# Patient Record
Sex: Female | Born: 1945 | Race: Black or African American | Hispanic: No | Smoking: Former smoker
Health system: Southern US, Community
[De-identification: ages and names within clinical notes are randomized; demographics above are authoritative.]

## PROBLEM LIST (undated history)

## (undated) DIAGNOSIS — G8929 Other chronic pain: Secondary | ICD-10-CM

## (undated) DIAGNOSIS — I10 Essential (primary) hypertension: Secondary | ICD-10-CM

## (undated) DIAGNOSIS — E079 Disorder of thyroid, unspecified: Secondary | ICD-10-CM

## (undated) DIAGNOSIS — Z973 Presence of spectacles and contact lenses: Secondary | ICD-10-CM

## (undated) DIAGNOSIS — I447 Left bundle-branch block, unspecified: Secondary | ICD-10-CM

## (undated) DIAGNOSIS — I2510 Atherosclerotic heart disease of native coronary artery without angina pectoris: Secondary | ICD-10-CM

## (undated) DIAGNOSIS — R51 Headache: Secondary | ICD-10-CM

## (undated) DIAGNOSIS — E785 Hyperlipidemia, unspecified: Secondary | ICD-10-CM

## (undated) DIAGNOSIS — I509 Heart failure, unspecified: Secondary | ICD-10-CM

## (undated) DIAGNOSIS — R519 Headache, unspecified: Secondary | ICD-10-CM

## (undated) DIAGNOSIS — R809 Proteinuria, unspecified: Secondary | ICD-10-CM

## (undated) DIAGNOSIS — N189 Chronic kidney disease, unspecified: Secondary | ICD-10-CM

## (undated) DIAGNOSIS — C801 Malignant (primary) neoplasm, unspecified: Secondary | ICD-10-CM

## (undated) DIAGNOSIS — M549 Dorsalgia, unspecified: Secondary | ICD-10-CM

## (undated) DIAGNOSIS — E1121 Type 2 diabetes mellitus with diabetic nephropathy: Secondary | ICD-10-CM

## (undated) DIAGNOSIS — M1990 Unspecified osteoarthritis, unspecified site: Secondary | ICD-10-CM

## (undated) DIAGNOSIS — Z992 Dependence on renal dialysis: Secondary | ICD-10-CM

## (undated) DIAGNOSIS — N186 End stage renal disease: Secondary | ICD-10-CM

## (undated) DIAGNOSIS — I272 Pulmonary hypertension, unspecified: Secondary | ICD-10-CM

## (undated) HISTORY — DX: Hyperlipidemia, unspecified: E78.5

## (undated) HISTORY — PX: MASTECTOMY: SHX3

## (undated) HISTORY — PX: BACK SURGERY: SHX140

## (undated) HISTORY — DX: Type 2 diabetes mellitus with diabetic nephropathy: E11.21

## (undated) HISTORY — DX: Disorder of thyroid, unspecified: E07.9

## (undated) HISTORY — PX: ABDOMINAL HYSTERECTOMY: SHX81

## (undated) HISTORY — PX: SPINE SURGERY: SHX786

## (undated) HISTORY — DX: Atherosclerotic heart disease of native coronary artery without angina pectoris: I25.10

## (undated) HISTORY — DX: Heart failure, unspecified: I50.9

## (undated) HISTORY — DX: Essential (primary) hypertension: I10

## (undated) HISTORY — PX: OTHER SURGICAL HISTORY: SHX169

## (undated) HISTORY — PX: TONSILLECTOMY: SUR1361

## (undated) HISTORY — PX: TUBAL LIGATION: SHX77

## (undated) HISTORY — DX: Chronic kidney disease, unspecified: N18.9

## (undated) HISTORY — DX: Proteinuria, unspecified: R80.9

## (undated) HISTORY — PX: APPENDECTOMY: SHX54

---

## 1997-10-22 ENCOUNTER — Encounter: Admission: RE | Admit: 1997-10-22 | Discharge: 1998-01-20 | Payer: Self-pay | Admitting: Anesthesiology

## 1998-02-16 ENCOUNTER — Encounter: Admission: RE | Admit: 1998-02-16 | Discharge: 1998-05-13 | Payer: Self-pay | Admitting: Anesthesiology

## 1998-05-13 ENCOUNTER — Encounter: Admission: RE | Admit: 1998-05-13 | Discharge: 1998-07-30 | Payer: Self-pay | Admitting: Anesthesiology

## 1998-07-30 ENCOUNTER — Encounter: Admission: RE | Admit: 1998-07-30 | Discharge: 1998-10-28 | Payer: Self-pay | Admitting: Anesthesiology

## 1998-11-23 ENCOUNTER — Encounter: Admission: RE | Admit: 1998-11-23 | Discharge: 1999-02-21 | Payer: Self-pay | Admitting: Anesthesiology

## 1999-03-24 ENCOUNTER — Encounter: Admission: RE | Admit: 1999-03-24 | Discharge: 1999-06-14 | Payer: Self-pay | Admitting: Anesthesiology

## 1999-06-14 ENCOUNTER — Encounter: Admission: RE | Admit: 1999-06-14 | Discharge: 1999-08-09 | Payer: Self-pay | Admitting: Anesthesiology

## 1999-08-09 ENCOUNTER — Encounter: Admission: RE | Admit: 1999-08-09 | Discharge: 1999-10-01 | Payer: Self-pay | Admitting: Anesthesiology

## 1999-10-01 ENCOUNTER — Encounter: Admission: RE | Admit: 1999-10-01 | Discharge: 1999-12-24 | Payer: Self-pay | Admitting: Anesthesiology

## 1999-12-24 ENCOUNTER — Encounter: Admission: RE | Admit: 1999-12-24 | Discharge: 2000-03-23 | Payer: Self-pay | Admitting: Anesthesiology

## 2000-03-29 ENCOUNTER — Encounter: Admission: RE | Admit: 2000-03-29 | Discharge: 2000-06-27 | Payer: Self-pay | Admitting: Anesthesiology

## 2000-08-14 ENCOUNTER — Encounter: Admission: RE | Admit: 2000-08-14 | Discharge: 2000-11-12 | Payer: Self-pay | Admitting: Anesthesiology

## 2000-12-29 ENCOUNTER — Encounter: Admission: RE | Admit: 2000-12-29 | Discharge: 2001-01-13 | Payer: Self-pay | Admitting: Anesthesiology

## 2001-05-25 ENCOUNTER — Emergency Department (HOSPITAL_COMMUNITY): Admission: EM | Admit: 2001-05-25 | Discharge: 2001-05-25 | Payer: Self-pay | Admitting: Emergency Medicine

## 2001-06-12 ENCOUNTER — Encounter (INDEPENDENT_AMBULATORY_CARE_PROVIDER_SITE_OTHER): Payer: Self-pay | Admitting: *Deleted

## 2001-06-12 ENCOUNTER — Ambulatory Visit (HOSPITAL_BASED_OUTPATIENT_CLINIC_OR_DEPARTMENT_OTHER): Admission: RE | Admit: 2001-06-12 | Discharge: 2001-06-12 | Payer: Self-pay | Admitting: Otolaryngology

## 2003-03-17 ENCOUNTER — Emergency Department (HOSPITAL_COMMUNITY): Admission: EM | Admit: 2003-03-17 | Discharge: 2003-03-17 | Payer: Self-pay | Admitting: Emergency Medicine

## 2003-03-24 ENCOUNTER — Emergency Department (HOSPITAL_COMMUNITY): Admission: EM | Admit: 2003-03-24 | Discharge: 2003-03-24 | Payer: Self-pay | Admitting: *Deleted

## 2003-04-01 ENCOUNTER — Emergency Department (HOSPITAL_COMMUNITY): Admission: EM | Admit: 2003-04-01 | Discharge: 2003-04-01 | Payer: Self-pay | Admitting: Emergency Medicine

## 2003-05-10 ENCOUNTER — Emergency Department (HOSPITAL_COMMUNITY): Admission: EM | Admit: 2003-05-10 | Discharge: 2003-05-10 | Payer: Self-pay | Admitting: Emergency Medicine

## 2003-05-17 DIAGNOSIS — C801 Malignant (primary) neoplasm, unspecified: Secondary | ICD-10-CM

## 2003-05-17 HISTORY — DX: Malignant (primary) neoplasm, unspecified: C80.1

## 2003-06-04 ENCOUNTER — Ambulatory Visit (HOSPITAL_COMMUNITY): Admission: RE | Admit: 2003-06-04 | Discharge: 2003-06-04 | Payer: Self-pay | Admitting: Sports Medicine

## 2003-06-05 ENCOUNTER — Emergency Department (HOSPITAL_COMMUNITY): Admission: EM | Admit: 2003-06-05 | Discharge: 2003-06-05 | Payer: Self-pay | Admitting: Emergency Medicine

## 2003-07-05 ENCOUNTER — Emergency Department (HOSPITAL_COMMUNITY): Admission: EM | Admit: 2003-07-05 | Discharge: 2003-07-05 | Payer: Self-pay | Admitting: Emergency Medicine

## 2003-12-05 ENCOUNTER — Emergency Department (HOSPITAL_COMMUNITY): Admission: EM | Admit: 2003-12-05 | Discharge: 2003-12-05 | Payer: Self-pay

## 2003-12-18 ENCOUNTER — Ambulatory Visit (HOSPITAL_COMMUNITY): Admission: RE | Admit: 2003-12-18 | Discharge: 2003-12-18 | Payer: Self-pay | Admitting: Orthopedic Surgery

## 2003-12-19 ENCOUNTER — Emergency Department (HOSPITAL_COMMUNITY): Admission: EM | Admit: 2003-12-19 | Discharge: 2003-12-19 | Payer: Self-pay | Admitting: Emergency Medicine

## 2003-12-19 ENCOUNTER — Ambulatory Visit (HOSPITAL_COMMUNITY): Admission: RE | Admit: 2003-12-19 | Discharge: 2003-12-19 | Payer: Self-pay | Admitting: Cardiovascular Disease

## 2003-12-19 HISTORY — PX: CORONARY ANGIOPLASTY: SHX604

## 2003-12-23 ENCOUNTER — Ambulatory Visit (HOSPITAL_COMMUNITY): Admission: RE | Admit: 2003-12-23 | Discharge: 2003-12-24 | Payer: Self-pay | Admitting: Orthopedic Surgery

## 2004-10-08 ENCOUNTER — Ambulatory Visit: Admission: RE | Admit: 2004-10-08 | Discharge: 2004-10-08 | Payer: Self-pay | Admitting: Internal Medicine

## 2004-10-15 ENCOUNTER — Emergency Department (HOSPITAL_COMMUNITY): Admission: EM | Admit: 2004-10-15 | Discharge: 2004-10-15 | Payer: Self-pay | Admitting: Emergency Medicine

## 2005-04-04 ENCOUNTER — Emergency Department (HOSPITAL_COMMUNITY): Admission: EM | Admit: 2005-04-04 | Discharge: 2005-04-04 | Payer: Self-pay | Admitting: Emergency Medicine

## 2005-04-24 ENCOUNTER — Emergency Department (HOSPITAL_COMMUNITY): Admission: EM | Admit: 2005-04-24 | Discharge: 2005-04-24 | Payer: Self-pay | Admitting: Emergency Medicine

## 2005-08-03 ENCOUNTER — Emergency Department (HOSPITAL_COMMUNITY): Admission: EM | Admit: 2005-08-03 | Discharge: 2005-08-03 | Payer: Self-pay | Admitting: Emergency Medicine

## 2005-11-27 ENCOUNTER — Emergency Department (HOSPITAL_COMMUNITY): Admission: EM | Admit: 2005-11-27 | Discharge: 2005-11-27 | Payer: Self-pay | Admitting: Emergency Medicine

## 2006-01-14 ENCOUNTER — Emergency Department (HOSPITAL_COMMUNITY): Admission: EM | Admit: 2006-01-14 | Discharge: 2006-01-14 | Payer: Self-pay | Admitting: Emergency Medicine

## 2007-01-16 ENCOUNTER — Emergency Department (HOSPITAL_COMMUNITY): Admission: EM | Admit: 2007-01-16 | Discharge: 2007-01-16 | Payer: Self-pay | Admitting: *Deleted

## 2007-04-18 ENCOUNTER — Emergency Department (HOSPITAL_COMMUNITY): Admission: EM | Admit: 2007-04-18 | Discharge: 2007-04-18 | Payer: Self-pay | Admitting: Emergency Medicine

## 2007-09-12 ENCOUNTER — Emergency Department (HOSPITAL_COMMUNITY): Admission: EM | Admit: 2007-09-12 | Discharge: 2007-09-12 | Payer: Self-pay | Admitting: Emergency Medicine

## 2008-04-19 ENCOUNTER — Inpatient Hospital Stay (HOSPITAL_COMMUNITY): Admission: EM | Admit: 2008-04-19 | Discharge: 2008-04-21 | Payer: Self-pay | Admitting: Emergency Medicine

## 2008-04-21 ENCOUNTER — Encounter (INDEPENDENT_AMBULATORY_CARE_PROVIDER_SITE_OTHER): Payer: Self-pay | Admitting: Internal Medicine

## 2008-07-02 ENCOUNTER — Emergency Department (HOSPITAL_COMMUNITY): Admission: EM | Admit: 2008-07-02 | Discharge: 2008-07-02 | Payer: Self-pay | Admitting: Emergency Medicine

## 2008-07-04 ENCOUNTER — Emergency Department (HOSPITAL_COMMUNITY): Admission: EM | Admit: 2008-07-04 | Discharge: 2008-07-04 | Payer: Self-pay | Admitting: Emergency Medicine

## 2008-07-20 ENCOUNTER — Emergency Department (HOSPITAL_COMMUNITY): Admission: EM | Admit: 2008-07-20 | Discharge: 2008-07-20 | Payer: Self-pay | Admitting: Emergency Medicine

## 2008-08-07 ENCOUNTER — Inpatient Hospital Stay (HOSPITAL_COMMUNITY): Admission: EM | Admit: 2008-08-07 | Discharge: 2008-08-08 | Payer: Self-pay | Admitting: Emergency Medicine

## 2009-03-31 ENCOUNTER — Emergency Department (HOSPITAL_COMMUNITY): Admission: EM | Admit: 2009-03-31 | Discharge: 2009-03-31 | Payer: Self-pay | Admitting: Emergency Medicine

## 2009-07-26 ENCOUNTER — Emergency Department (HOSPITAL_COMMUNITY): Admission: EM | Admit: 2009-07-26 | Discharge: 2009-07-27 | Payer: Self-pay | Admitting: Emergency Medicine

## 2009-12-23 ENCOUNTER — Emergency Department (HOSPITAL_COMMUNITY): Admission: EM | Admit: 2009-12-23 | Discharge: 2009-12-23 | Payer: Self-pay | Admitting: Emergency Medicine

## 2010-01-11 ENCOUNTER — Emergency Department (HOSPITAL_COMMUNITY): Admission: EM | Admit: 2010-01-11 | Discharge: 2010-01-12 | Payer: Self-pay | Admitting: Emergency Medicine

## 2010-03-20 ENCOUNTER — Emergency Department (HOSPITAL_COMMUNITY): Admission: EM | Admit: 2010-03-20 | Discharge: 2010-03-20 | Payer: Self-pay | Admitting: Emergency Medicine

## 2010-03-22 ENCOUNTER — Emergency Department (HOSPITAL_COMMUNITY): Admission: EM | Admit: 2010-03-22 | Discharge: 2010-03-22 | Payer: Self-pay | Admitting: Emergency Medicine

## 2010-06-06 ENCOUNTER — Encounter: Payer: Self-pay | Admitting: Internal Medicine

## 2010-06-11 ENCOUNTER — Inpatient Hospital Stay (HOSPITAL_COMMUNITY)
Admission: EM | Admit: 2010-06-11 | Discharge: 2010-06-13 | Payer: Self-pay | Source: Home / Self Care | Attending: Internal Medicine | Admitting: Internal Medicine

## 2010-06-12 LAB — GLUCOSE, CAPILLARY: Glucose-Capillary: 250 mg/dL — ABNORMAL HIGH (ref 70–99)

## 2010-06-12 LAB — POCT I-STAT, CHEM 8
BUN: 64 mg/dL — ABNORMAL HIGH (ref 6–23)
Calcium, Ion: 1.27 mmol/L (ref 1.12–1.32)
HCT: 35 % — ABNORMAL LOW (ref 36.0–46.0)
Hemoglobin: 11.9 g/dL — ABNORMAL LOW (ref 12.0–15.0)
Sodium: 135 mEq/L (ref 135–145)
TCO2: 19 mmol/L (ref 0–100)

## 2010-06-12 LAB — BASIC METABOLIC PANEL
BUN: 51 mg/dL — ABNORMAL HIGH (ref 6–23)
BUN: 45 mg/dL — ABNORMAL HIGH (ref 6–23)
CO2: 20 mEq/L (ref 19–32)
Calcium: 9 mg/dL (ref 8.4–10.5)
Calcium: 9.1 mg/dL (ref 8.4–10.5)
Chloride: 111 mEq/L (ref 96–112)
Creatinine, Ser: 3.93 mg/dL — ABNORMAL HIGH (ref 0.4–1.2)
Creatinine, Ser: 3.22 mg/dL — ABNORMAL HIGH (ref 0.4–1.2)
GFR calc non Af Amer: 14 mL/min — ABNORMAL LOW (ref >60)
Glucose, Bld: 206 mg/dL — ABNORMAL HIGH (ref 70–99)
Glucose, Bld: 246 mg/dL — ABNORMAL HIGH (ref 70–99)
Potassium: 5.5 mEq/L — ABNORMAL HIGH (ref 3.5–5.1)

## 2010-06-12 LAB — DIFFERENTIAL
Basophils Absolute: 0.1 10*3/uL (ref 0.0–0.1)
Eosinophils Absolute: 0.6 10*3/uL (ref 0.0–0.7)
Eosinophils Relative: 5 % (ref 0–5)
Lymphs Abs: 4.9 10*3/uL — ABNORMAL HIGH (ref 0.7–4.0)
Monocytes Absolute: 1.1 10*3/uL — ABNORMAL HIGH (ref 0.1–1.0)
Neutrophils Relative %: 44 % (ref 43–77)

## 2010-06-12 LAB — CBC
MCH: 24.3 pg — ABNORMAL LOW (ref 26.0–34.0)
MCHC: 34.6 g/dL (ref 30.0–36.0)
MCV: 70.2 fL — ABNORMAL LOW (ref 78.0–100.0)
Platelets: 219 10*3/uL (ref 150–400)
Platelets: 168 10*3/uL (ref 150–400)
RBC: 4.91 MIL/uL (ref 3.87–5.11)
RBC: 4.4 MIL/uL (ref 3.87–5.11)
RDW: 14.6 % (ref 11.5–15.5)
WBC: 12 10*3/uL — ABNORMAL HIGH (ref 4.0–10.5)

## 2010-06-12 LAB — URINALYSIS, ROUTINE W REFLEX MICROSCOPIC
Bilirubin Urine: NEGATIVE
Nitrite: NEGATIVE
Specific Gravity, Urine: 1.019 (ref 1.005–1.030)
Urobilinogen, UA: 0.2 mg/dL (ref 0.0–1.0)
pH: 5.5 (ref 5.0–8.0)

## 2010-06-12 LAB — COMPREHENSIVE METABOLIC PANEL
ALT: 11 U/L (ref 0–35)
AST: 16 U/L (ref 0–37)
CO2: 18 mEq/L — ABNORMAL LOW (ref 19–32)
Chloride: 113 mEq/L — ABNORMAL HIGH (ref 96–112)
Creatinine, Ser: 3.72 mg/dL — ABNORMAL HIGH (ref 0.4–1.2)
GFR calc Af Amer: 15 mL/min — ABNORMAL LOW (ref >60)
GFR calc non Af Amer: 12 mL/min — ABNORMAL LOW (ref >60)
Glucose, Bld: 268 mg/dL — ABNORMAL HIGH (ref 70–99)
Sodium: 135 mEq/L (ref 135–145)
Total Bilirubin: 0.4 mg/dL (ref 0.3–1.2)

## 2010-06-12 LAB — SODIUM, URINE, RANDOM: Sodium, Ur: 99 mEq/L

## 2010-06-12 LAB — HEMOGLOBIN A1C
Hgb A1c MFr Bld: 10.1 % — ABNORMAL HIGH (ref <5.7)
Mean Plasma Glucose: 243 mg/dL — ABNORMAL HIGH (ref <117)

## 2010-06-12 LAB — PROTIME-INR: Prothrombin Time: 25 seconds — ABNORMAL HIGH (ref 11.6–15.2)

## 2010-06-12 LAB — URINE MICROSCOPIC-ADD ON

## 2010-06-12 LAB — MAGNESIUM: Magnesium: 2.3 mg/dL (ref 1.5–2.5)

## 2010-06-13 LAB — CBC
HCT: 31.7 % — ABNORMAL LOW (ref 36.0–46.0)
MCHC: 34.7 g/dL (ref 30.0–36.0)
MCV: 70.1 fL — ABNORMAL LOW (ref 78.0–100.0)
Platelets: 201 10*3/uL (ref 150–400)
RDW: 14.8 % (ref 11.5–15.5)
WBC: 8.9 10*3/uL (ref 4.0–10.5)

## 2010-06-13 LAB — PROTEIN / CREATININE RATIO, URINE
Creatinine, Urine: 83.7 mg/dL
Protein Creatinine Ratio: 2.45 — ABNORMAL HIGH (ref 0.00–0.15)
Total Protein, Urine: 205 mg/dL

## 2010-06-13 LAB — GLUCOSE, CAPILLARY: Glucose-Capillary: 361 mg/dL — ABNORMAL HIGH (ref 70–99)

## 2010-06-13 LAB — BASIC METABOLIC PANEL
BUN: 39 mg/dL — ABNORMAL HIGH (ref 6–23)
CO2: 18 mEq/L — ABNORMAL LOW (ref 19–32)
Calcium: 8.2 mg/dL — ABNORMAL LOW (ref 8.4–10.5)
Chloride: 112 mEq/L (ref 96–112)
Creatinine, Ser: 2.69 mg/dL — ABNORMAL HIGH (ref 0.4–1.2)
GFR calc Af Amer: 22 mL/min — ABNORMAL LOW (ref >60)
Glucose, Bld: 214 mg/dL — ABNORMAL HIGH (ref 70–99)

## 2010-06-13 NOTE — Discharge Summary (Signed)
NAME:  Alexis [name], Alexis [name] NO.:  [id number]  MEDICAL RECORD NO.:  [id number]          PATIENT TYPE:  INP  LOCATION:  [id number]                         FACILITY:  [facility]  PHYSICIAN:  Romero Belling, MD       DATE OF BIRTH:  1946-05-12  DATE OF ADMISSION:  06/12/2010 DATE OF DISCHARGE:  06/13/2010                        DISCHARGE SUMMARY - REFERRING   PRIMARY CARE PHYSICIAN:  Nolene Ebbs, M.D.  NEPHROLOGIST:  Sol Blazing, M.D.  DISCHARGE DIAGNOSES: 1. Acute-on-chronic renal failure, improved, almost creatinine back to     baseline. 2. Chronic kidney disease stage 3. 3. 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 4. Hypertension. 5. History of left breast cancer status post mastectomy. 6. History of hyperlipidemia. 7. History of coronary artery disease. 8. History of urinary tract infection.  DISCHARGE MEDICATIONS: 1. Amlodipine increased to 10 mg p.o. daily. 2. Acetaminophen 650 mg by mouth every 4 hours as needed for pain. 3. Aspirin 81 mg p.o. daily. 4. Humalog 75/25 insulin Lispro Protamine use as sliding scale b.i.d. 5. Trimethoprim sulfamethoxazole 800 mg/160 mg one tablet by mouth     b.i.d.  The patient reports that she has five more days of this     antibiotic.  DIET:  Carbohydrate-modified.  PROCEDURE PERFORMED:  The patient had a renal ultrasound which showed indeterminate 2.7 cm lesion in the right kidney.  MRI with and without contrast was recommended.  Also the patient should have an MRI of the abdomen and an MRI of the right kidney.  The patient had a chest x-ray which showed no acute cardiopulmonary disease.  CONSULTATIONS:  Sol Blazing, M.D.  FOLLOWUP:  The patient should follow up with Dr. Jeanie Cooks in 1 week.  The patient should have a BMET with a magnesium and phosphorus in 1 week. The patient should have a referral to see a nephrologist also.  The patient should also have an MRI of the right kidney to make sure to evaluate  the renal lesion.  CHIEF COMPLAINT:  Cough.  INITIAL HISTORY AND PHYSICAL:  The patient is a 65 year old female with a history of a cough for the past 3 weeks which is what brought her into the Emergency Department.  However, while in the Emergency Department the patient had laboratories done which showed her creatinine was 4.4. Her baseline creatinine is about 2.1 to 2.3.  HOSPITAL COURSE: 1. Acute-on-chronic renal failure.  The patient was admitted to the     hospital.  The patient's creatinine was noted to be 4.4.  On     reviewing the old records, the patient's baseline creatinine was     2.1 to 2.3.  The patient was given IV fluids.  The lisinopril was     discontinued.  The patient was seen by Dr. Roney Jaffe of     [facility].  He thought that the acute renal     failure was precipitated by the diuretics, ACE inhibitors, NSAIDs,     and possibly Bactrim, Cipro-related acute interstitial nephritis.     The patient's creatinine with IV fluids in one day went from 4.4 to  2.69, almost near her baseline.  The patient is deemed ready for         discharge. I spoke to Dr. Elnita Maxwell. He reported that he patient could eventually take an ARB however ACE inhibitor's should be avoided. 2. Chronic kidney disease stage 4.  Again, the patient has chronic     kidney disease stage 4.  The patient's creatinine has improved to     2.69.  Again, should have an outpatient followup with Nephrology. 3. Diabetes mellitus type 2 with nephropathy.  The patient was placed     on a sensitive sliding scale during the hospitalization.  The     patient should continue taking the Humalog/Lispro 75/25 mix at     home. 4. History of left breast cancer status post mastectomy which is     stable.  This happened 12 years ago. 5. Hyperlipidemia, stable. 6. History of coronary artery disease.  The patient was placed on a     baby aspirin. 7. Urinary tract infection.  The patient received  Rocephin during the     hospitalization.  The patient's urine did have bacteria but she was     asymptomatic.  The patient will be discharged home to complete the     5-day course of Bactrim that she has left. 8. Hyperkalemia.  The patient had multiple episodes of hyperkalemia.     The patient was taking potassium at home also with lisinopril.  The     patient received multiple doses of Kayexalate and lactulose.  The     patient's potassium is 5.0 on the day of discharge.  Possible     discharge on June 13, 2010.  Hypokalemia was brought on by the     ACE inhibitors, the potassium supplementation and the acute renal     failure.     Romero Belling, MD     NH/MEDQ  D:  06/13/2010  T:  06/13/2010  Job:  [id number]  cc:   Nolene Ebbs, M.D. [phone]  Sol Blazing, M.D. [phone]  Electronically Signed by Romero Belling MD on 06/13/2010 04:44:35 PM

## 2010-06-14 ENCOUNTER — Ambulatory Visit (HOSPITAL_COMMUNITY)
Admission: RE | Admit: 2010-06-14 | Discharge: 2010-06-14 | Payer: Self-pay | Source: Home / Self Care | Attending: Internal Medicine | Admitting: Internal Medicine

## 2010-06-14 LAB — HEPATITIS PANEL, ACUTE
Hep A IgM: NEGATIVE
Hep B C IgM: NEGATIVE

## 2010-06-14 NOTE — Consult Note (Signed)
NAME:  Alexis [name], LEGO NO.:  [id number]  MEDICAL RECORD NO.:  [id number]          PATIENT TYPE:  INP  LOCATION:  [id number]                         FACILITY:  [facility]  PHYSICIAN:  Sol Blazing, M.D.DATE OF BIRTH:  [birth year]  DATE OF CONSULTATION:  06/13/2010 DATE OF DISCHARGE:                                CONSULTATION   REASON FOR CONSULT:  Elevated creatinine.  HISTORY:  The patient is a 65 year old female with history of longstanding high blood pressure and diabetes.  She has painful diabetic neuropathy over 10 years' duration.  She has had high blood pressure for many years.  She did not know this but she has had progressive chronic kidney disease also over the last 6 or 7 years, looking back through the labs and in the system here.  The patient presented with a cough to emergency room.  She had a nonproductive irritating cough for 3 to 4 weeks.  She has been on ACE inhibitors she thinks for about a year.  She has also had a recent foot infection on the right foot between the fourth and fifth toes and has been treated over the last 7 to 10 days with Bactrim and Cipro for this.  She is also taking Advil as needed for headaches and was on lisinopril and hydrochlorothiazide at the time of admission. The patient was admitted because her creatinine was 4.4 up from baseline of 2.1 to 2.3 back in November 2011.  The patient denies any urinary complaints.  She has been coughing off and on for 1 to 2 months she says.  DATES AND CREATININE: 1. In 2005:  Creatinine was 0.9. 2. In 2006:  1.2. 3. In 2008:  1.5. 4. In December 2009:  1.4. 5. In November 2010:  1.46. 6. In March 2011:  2.1. 7. In November 2011:  2.3. 8. In June 11, 2010:  4.4. 9. In June 12, 2010:  3.9 > 3.2.  PAST MEDICAL HISTORY: 1. Breast cancer with left mastectomy, on chemotherapy. 2. Hypertension. 3. Decreased EF in 2010 45%. 4. Hyperlipidemia. 5. Diabetes mellitus type 2 with  painful diabetic neuropathy. 6. Coronary artery disease with RCA stent in 2003.  PAST SURGICAL HISTORY: 1. Left mastectomy. 2. Hysterectomy. 3. Tubal ligation. 4. Left forearm fracture with ORIF.  MEDICATIONS ON ADMISSION: 1. Cipro. 2. Bactrim. 3. KCl. 4. Advil. 5. Lisinopril/hydrochlorothiazide. 6. Amlodipine. 7. Insulin.  SOCIAL HISTORY:  Positive for tobacco, quit in 2004.  No alcohol.  FAMILY HISTORY:  Mother died at age 65 with breast cancer.  Father died of gunshot wound at age 67.  REVIEW OF SYSTEMS:  Denies any recent fever, chills, sweats, headache, visual change, sore throat, difficulty swallowing, productive cough, hemoptysis, chest pain, or shortness of breath.  Denies any abdominal pain, nausea, diarrhea, difficulty voiding, dysuria, joint pain or swelling, skin rash, or itching, ankle swelling, focal numbness, or weakness.  PHYSICAL EXAMINATION:  VITAL SIGNS:  Blood pressure is 123/78, pulse 90, respirations 18, O2 sat 95% on room air. GENERAL:  The patient is alert and oriented x3, in no distress. SKIN:  Warm and dry. HEENT:  PERRLA, EOMI.  Throat is clear. NECK:  Supple without JVD or bruits. CHEST:  Clear throughout.  No rales, rhonchi, or wheezing. CARDIAC:  Regular rate and rhythm.  No murmur, rub, or gallop. ABDOMEN:  Obese, soft, nontender.  Active bowel sounds.  No organomegaly is noted.  No ascites. GU:  Deferred. RECTAL:  Deferred. EXTREMITIES:  Some mild hypertrophic changes in the knee joints bilaterally.  There is some deformity of the right ankle looks like a right foot which is chronic.  The infection in the right fourth or fifth toe interspace is healing.  There is still a tissue defect there but no signs of inflammation.  Pulses in the feet are minimally palpable.  No ankle edema.  No calf cords or tenderness.  No skin changes. NEUROLOGIC:  Oriented x3.  No focal deficits.  LABORATORY DATA:  Sodium 137, potassium 5.5, chloride 111, CO2  20, BUN 45, creatinine 3.22, glucose 246, calcium 9.1.  Urine sodium 99.  TSH is 1.27, magnesium is 2.3, INR is 2.2.  Hemoglobin 10.7, white count 10,000, platelets 168,000.  Urinalysis greater than 300 protein, 11 to 20 white blood cells, 0 to 2 red blood cells, many bacteria.  Urine culture was not done.  IMPRESSION: 1. Acute on chronic renal failure due to hypovolemia,     ACE inhibitor and NSAIDs.  Cannot rule out interstitial nephritis     due to antibiotics.  Improving overall with IV fluids and     discontinuation of ACE inhibitor and NSAIDs. 2. Chronic kidney disease, baseline creatinine 2.1 to 2.3 in November     2011, stage IV disease with GFR 26 to 28 mL per minute estimated.     Has not had workup previously.  Probable diabetic nephropathy and     hypertensive renal disease. 3. 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 4. Hypertension, longstanding. 5. Breast cancer s/p left mastectomy and chemotherapy. 6. Hyperlipidemia. 7. Coronary artery disease with stent in 2003.  RECOMMENDATIONS:  Continue to hold and avoid NSAIDs and other nephrotoxins.  Stop ACE inhibitors indefinitely.  List as intolerance due to cough.  Get urine for protein creatinine ratio, intact PTH and 25- hydroxy vitamin D level.  Restart IV fluids at 100 mL an hour of normal saline.  Primary service has already ordered an SPEP which is pending. Suspect the patient will improve back down closer to her baseline.  Will need followup for chronic kidney disease after discharge.     Sol Blazing, M.D.     RDS/MEDQ  D:  06/13/2010  T:  06/13/2010  Job:  [id number]  Electronically Signed by Roney Jaffe M.D. on 06/14/2010 08:32:01 AM

## 2010-06-15 LAB — PROTEIN ELECTROPHORESIS, SERUM
Alpha-1-Globulin: 4.3 % (ref 2.9–4.9)
Alpha-2-Globulin: 13.6 % — ABNORMAL HIGH (ref 7.1–11.8)
Beta 2: 6.9 % — ABNORMAL HIGH (ref 3.2–6.5)
Gamma Globulin: 20 % — ABNORMAL HIGH (ref 11.1–18.8)
M-Spike, %: NOT DETECTED g/dL

## 2010-06-19 ENCOUNTER — Encounter: Payer: Self-pay | Admitting: Internal Medicine

## 2010-06-21 LAB — UIFE/LIGHT CHAINS/TP QN, 24-HR UR
Albumin, U: DETECTED
Beta, Urine: DETECTED — AB
Free Lambda Lt Chains,Ur: <0.05 mg/dL — ABNORMAL LOW (ref 0.08–1.01)
Total Protein, Urine: 1.6 mg/dL

## 2010-06-26 NOTE — H&P (Signed)
NAME:  Alexis [name], Alexis [name]                ACCOUNT NO.:  [id number]  MEDICAL RECORD NO.:  [id number]          PATIENT TYPE:  INP  LOCATION:  0101                         FACILITY:  [facility]  PHYSICIAN:  Farris Has, MDDATE OF BIRTH:  May 18, 1945  DATE OF ADMISSION:  06/12/2010 DATE OF DISCHARGE:                             HISTORY & PHYSICAL   PRIMARY CARE PROVIDER:  Nolene Ebbs, M.D.  CHIEF COMPLAINT:  Cough.  HISTORY OF PRESENT ILLNESS:  The patient is a 65 year old female with history of cough for the past 3 weeks which is what brought her into the Emergency Department but while she was in the ED she had some labs done that showed that her creatinine has progressively worsened from around 2 to now 4.4 since November.  Even back in the summer she was told that something was going on in her kidneys but apparently she was not asked to follow up and she did not follow up regarding this.  She is diabetic and has a history of high blood pressure.  REVIEW OF SYSTEMS:  Besides cough, she has not had any chest pain.  She has had some nausea and vomiting.  She has no abdominal pain.  No diarrhea.  She has had decreased p.o. intake.  She has had a little cut between her fifth and fourth toes on her right lower extremity for which she has received Cipro and Septra.  She has not had any fevers or chills.  No change in her urine quality.  No change in her urine frequency or volume.  Otherwise review of systems is negative.  PAST MEDICAL HISTORY:  Significant for: 1. Hypertension. 2. Coronary artery disease status post MI in 2006. 3. Diabetes. 4. Obesity. 5. Chronic renal insufficiency per labs.  She has had a slow rise in     her creatinine.  Her creatinine in November was up to 2. 6. History of breast cancer status post mastectomy on the left.  SOCIAL HISTORY:  The patient used to smoke but quit 5 years ago.  Does not drink or abuse drugs.  FAMILY HISTORY:   Noncontributory.  ALLERGIES:  NONE.  MEDICATIONS: 1. Ciprofloxacin 500 mg twice a day for the past week. 2. Amlodipine 5 mg a day. 3. Lisinopril/hydrochlorothiazide 10/12.5 mg daily. 4. Bactrim one tablet twice a day for the past week.  PHYSICAL EXAMINATION:  VITAL SIGNS:  Temperature 98.7, blood pressure 151/80, pulse 94, respiration 18, saturating 99% on room air. GENERAL:  The patient appears to be in no acute distress. HEENT:  Head atraumatic.  Moist mucous membranes. LUNGS:  Clear to auscultation bilaterally. HEART:  Regular rate and rhythm.  No murmurs appreciated. ABDOMEN:  Soft, nontender, nondistended. EXTREMITIES:  Without clubbing, cyanosis or edema.  The wound between her fifth and fourth toe appears to be dry and healing. SKIN:  No rashes noted.  LABORATORY DATA:  White blood cell count 12, hemoglobin 11.9, sodium 135, potassium 5.7, creatinine 4.4.  UA showing 11 to 20 white blood cells.  RADIOLOGIC:  Chest x-ray is unremarkable.  EKG showing LVH but no peak T- waves.  ASSESSMENT AND PLAN:  This is a 65 year old female with acute-on-chronic renal failure and urinary tract infection. 1. Acute-on-chronic renal failure.  She has had some nausea recently     and somewhat decreased per oral intake.  We will see how she does     with a little bit of intravenous fluids.  Probably will need Renal     consult in the morning.  At this point she does not have an     immediate indication for dialysis.  If her creatinine does not     improve or if she develops indication with dialysis, she may need     to be transferred to [facility] for further care.  She will     definitely need to be evaluated by Renal.  We will also order renal     ultrasound SPEP, UPEP, and renal electrolytes.  Check magnesium and     phosphate level. 2. Urinary tract infection.  The patient has been on Cipro.  We will     cover with Rocephin for now. 3. Skin infection.  This has improved.  We will  cover with     doxycycline. 4. Diabetes mellitus.  We will put on sliding scale.  She is     apparently not on any home medications that she knows of. 5. Hypertension.  Hold her lisinopril.  Increase Norvasc. 6. Prophylaxis.  Heparin subcutaneous p.o. intake. 7. Bronchitis or recurrent cough seems to have improved with     albuterol.  She has no evidence of infection per her chest x-ray.     For now we will continue medical management.  If this is related to     lisinopril, also stopping lisinopril may help.     Farris Has, MD     AVD/MEDQ  D:  06/12/2010  T:  06/12/2010  Job:  [id number]  cc:   Nolene Ebbs, M.D. [phone]  Electronically Signed by Toy Baker MD on 06/26/2010 07:25:02 PM

## 2010-07-27 LAB — DIFFERENTIAL
Basophils Relative: 0 % (ref 0–1)
Eosinophils Absolute: 0.3 10*3/uL (ref 0.0–0.7)
Eosinophils Relative: 3 % (ref 0–5)
Monocytes Absolute: 0.6 10*3/uL (ref 0.1–1.0)
Monocytes Relative: 6 % (ref 3–12)

## 2010-07-27 LAB — GLUCOSE, CAPILLARY: Glucose-Capillary: 258 mg/dL — ABNORMAL HIGH (ref 70–99)

## 2010-07-27 LAB — CBC
HCT: 41.2 % (ref 36.0–46.0)
Hemoglobin: 13.7 g/dL (ref 12.0–15.0)
MCH: 25.1 pg — ABNORMAL LOW (ref 26.0–34.0)
MCHC: 33.3 g/dL (ref 30.0–36.0)
MCV: 75.4 fL — ABNORMAL LOW (ref 78.0–100.0)

## 2010-07-27 LAB — BASIC METABOLIC PANEL
BUN: 27 mg/dL — ABNORMAL HIGH (ref 6–23)
CO2: 25 mEq/L (ref 19–32)
CO2: 28 mEq/L (ref 19–32)
Glucose, Bld: 173 mg/dL — ABNORMAL HIGH (ref 70–99)
Glucose, Bld: 201 mg/dL — ABNORMAL HIGH (ref 70–99)
Potassium: 3.8 mEq/L (ref 3.5–5.1)
Potassium: 4.3 mEq/L (ref 3.5–5.1)
Sodium: 136 mEq/L (ref 135–145)
Sodium: 139 mEq/L (ref 135–145)

## 2010-08-08 LAB — BASIC METABOLIC PANEL
BUN: 30 mg/dL — ABNORMAL HIGH (ref 6–23)
CO2: 24 mEq/L (ref 19–32)
Chloride: 99 mEq/L (ref 96–112)
Creatinine, Ser: 2.15 mg/dL — ABNORMAL HIGH (ref 0.4–1.2)
GFR calc Af Amer: 28 mL/min — ABNORMAL LOW (ref >60)

## 2010-08-18 LAB — GLUCOSE, CAPILLARY

## 2010-08-18 LAB — URINALYSIS, ROUTINE W REFLEX MICROSCOPIC
Glucose, UA: NEGATIVE mg/dL
Protein, ur: >300 mg/dL — AB

## 2010-08-18 LAB — URINE CULTURE: Colony Count: 35000

## 2010-08-18 LAB — COMPREHENSIVE METABOLIC PANEL
ALT: 20 U/L (ref 0–35)
Alkaline Phosphatase: 50 U/L (ref 39–117)
CO2: 25 mEq/L (ref 19–32)
Glucose, Bld: 102 mg/dL — ABNORMAL HIGH (ref 70–99)
Potassium: 3.4 mEq/L — ABNORMAL LOW (ref 3.5–5.1)
Sodium: 139 mEq/L (ref 135–145)
Total Protein: 5.9 g/dL — ABNORMAL LOW (ref 6.0–8.3)

## 2010-08-18 LAB — DIFFERENTIAL
Basophils Absolute: 0.1 10*3/uL (ref 0.0–0.1)
Lymphs Abs: 2.1 10*3/uL (ref 0.7–4.0)
Monocytes Relative: 9 % (ref 3–12)
Neutro Abs: 6.5 10*3/uL (ref 1.7–7.7)

## 2010-08-18 LAB — URINE MICROSCOPIC-ADD ON

## 2010-08-18 LAB — POCT CARDIAC MARKERS
CKMB, poc: 6.6 ng/mL (ref 1.0–8.0)
Troponin i, poc: <0.05 ng/mL (ref 0.00–0.09)

## 2010-08-18 LAB — CBC
Hemoglobin: 12.1 g/dL (ref 12.0–15.0)
RBC: 4.81 MIL/uL (ref 3.87–5.11)
RDW: 15 % (ref 11.5–15.5)
WBC: 10.2 10*3/uL (ref 4.0–10.5)

## 2010-08-18 LAB — RAPID URINE DRUG SCREEN, HOSP PERFORMED: Tetrahydrocannabinol: NOT DETECTED

## 2010-08-26 LAB — LIPID PANEL
Cholesterol: 255 mg/dL — ABNORMAL HIGH (ref 0–200)
HDL: 41 mg/dL (ref >39)
LDL Cholesterol: 157 mg/dL — ABNORMAL HIGH (ref 0–99)
Triglycerides: 143 mg/dL (ref <150)

## 2010-08-26 LAB — CBC
HCT: 37.5 % (ref 36.0–46.0)
Hemoglobin: 12.5 g/dL (ref 12.0–15.0)
Hemoglobin: 12.7 g/dL (ref 12.0–15.0)
MCV: 76.1 fL — ABNORMAL LOW (ref 78.0–100.0)
Platelets: 213 10*3/uL (ref 150–400)
RBC: 4.88 MIL/uL (ref 3.87–5.11)
WBC: 11.3 10*3/uL — ABNORMAL HIGH (ref 4.0–10.5)
WBC: 9.4 10*3/uL (ref 4.0–10.5)

## 2010-08-26 LAB — URINALYSIS, ROUTINE W REFLEX MICROSCOPIC
Ketones, ur: NEGATIVE mg/dL
Nitrite: NEGATIVE
Specific Gravity, Urine: 1.028 (ref 1.005–1.030)
Urobilinogen, UA: 0.2 mg/dL (ref 0.0–1.0)
pH: 8 (ref 5.0–8.0)

## 2010-08-26 LAB — CK TOTAL AND CKMB (NOT AT ARMC)
CK, MB: 2.5 ng/mL (ref 0.3–4.0)
Relative Index: 1.6 (ref 0.0–2.5)

## 2010-08-26 LAB — HEMOGLOBIN A1C
Hgb A1c MFr Bld: 11.1 % — ABNORMAL HIGH (ref 4.6–6.1)
Mean Plasma Glucose: 272 mg/dL

## 2010-08-26 LAB — APTT: aPTT: 28 seconds (ref 24–37)

## 2010-08-26 LAB — CARDIAC PANEL(CRET KIN+CKTOT+MB+TROPI)
CK, MB: 2 ng/mL (ref 0.3–4.0)
CK, MB: 2.1 ng/mL (ref 0.3–4.0)
Relative Index: 1.4 (ref 0.0–2.5)
Relative Index: 1.5 (ref 0.0–2.5)
Total CK: 154 U/L (ref 7–177)
Troponin I: 0.02 ng/mL (ref 0.00–0.06)

## 2010-08-26 LAB — POCT I-STAT, CHEM 8
BUN: 15 mg/dL (ref 6–23)
Chloride: 100 mEq/L (ref 96–112)
Creatinine, Ser: 1.1 mg/dL (ref 0.4–1.2)
Glucose, Bld: 213 mg/dL — ABNORMAL HIGH (ref 70–99)
Potassium: 3.2 mEq/L — ABNORMAL LOW (ref 3.5–5.1)

## 2010-08-26 LAB — COMPREHENSIVE METABOLIC PANEL
Albumin: 2.5 g/dL — ABNORMAL LOW (ref 3.5–5.2)
BUN: 14 mg/dL (ref 6–23)
Creatinine, Ser: 1.1 mg/dL (ref 0.4–1.2)
Glucose, Bld: 304 mg/dL — ABNORMAL HIGH (ref 70–99)
Total Bilirubin: 0.6 mg/dL (ref 0.3–1.2)
Total Protein: 6.4 g/dL (ref 6.0–8.3)

## 2010-08-26 LAB — GLUCOSE, CAPILLARY
Glucose-Capillary: 274 mg/dL — ABNORMAL HIGH (ref 70–99)
Glucose-Capillary: 327 mg/dL — ABNORMAL HIGH (ref 70–99)

## 2010-08-26 LAB — URINE MICROSCOPIC-ADD ON

## 2010-08-26 LAB — POCT CARDIAC MARKERS
CKMB, poc: 3.2 ng/mL (ref 1.0–8.0)
Troponin i, poc: <0.05 ng/mL (ref 0.00–0.09)

## 2010-08-26 LAB — PROTIME-INR: Prothrombin Time: 13.3 seconds (ref 11.6–15.2)

## 2010-08-26 LAB — TROPONIN I: Troponin I: 0.04 ng/mL (ref 0.00–0.06)

## 2010-09-28 NOTE — Consult Note (Signed)
NAME:  Alexis [name], Alexis [name] NO.:  [id number]   MEDICAL RECORD NO.:  [id number]          PATIENT TYPE:  INP   LOCATION:  [id number]                         FACILITY:  [facility]   PHYSICIAN:  Thayer Headings, M.D. DATE OF BIRTH:  [birth year]   DATE OF CONSULTATION:  DATE OF DISCHARGE:  04/21/2008                                 CONSULTATION   Alexis [name] is a 65 year old black female who was admitted with chest  pain.  We are asked by Dr. Nolene Ebbs to see her regarding the  cardiology consultation.   Alexis [name] is a 65 year old female with a history of diabetes mellitus  and coronary artery disease.  She has a history of an inferior wall  myocardial infarction.  She has had a stent placed down in [place].  We  performed a heart catheterization in 2005, which revealed a chronic  occlusion of that stent with good collateral filling.  She had mild-to-  moderate disease involving the other vessels.  She has overall done  fairly well and has not had any severe chest pain.  She has been to the  emergency room on several different occasions for episodes of chest  pains over the years.  She also has been there for some high blood  pressure.   She has not seen me in the past 5 years.   She was admitted to the hospital with episodes of chest pain.  These  episodes were intermittent.  There was no association with eating,  drinking, change of position, taking a deep breath or exercise.  They  lasted for about 5 or 10 seconds.  They radiate somewhat to the left  shoulder.  She has also had some left arm pain.  She does not have any  diaphoresis.  There is no nausea, vomiting, syncope, or presyncope.   CURRENT MEDICATIONS:  1. Lantus insulin each day.  2. Oxycodone as needed.  3. Benicar 40 mg a day.  4. Hydrochlorothiazide 25 mg a day.  5. Coreg 12.5 mg a day.  6. Aspirin once a day.   ALLERGIES:  None.   PAST MEDICAL HISTORY:  1. Coronary artery disease - status post  inferior wall myocardial      infarction.  2. History of hypertension.  3. History of hypercholesterolemia.  4. Diabetes mellitus.  5. History of breast cancer.  6. History of hysterectomy and mastectomy.   Family history is positive for diabetes mellitus and hypertension.   SOCIAL HISTORY:  The patient used to smoke but quit smoking 5 years ago.  She does not drink alcohol.   Review of systems is reviewed.  She denies any heat or cold intolerance.  She has had some weight gain.  She denies any rash or skin nodules.  She  denies any change in her appetite, change in her eating habits, or  change in her bowel habits.  She denies any blood in her urine or blood  in her stool.  She denies any muscular aches and pains.  She has had  intermittent episodes of chest pain.  She denies any constitutional  symptoms.  The review of systems is reviewed and all other systems are  negative.   PHYSICAL EXAMINATION:  GENERAL:  She is a middle-aged female, in no  acute distress.  She is alert and oriented x3, and her mood and affect  are normal.  VITAL SIGNS:  Her temperature is 98.2, blood pressure is 177/82 with O2  saturations of 97.  Her heart rate is 80.  NECK:  A 2+ carotids.  She has no bruits, no JVD, no thyromegaly.  HEENT:  She is normocephalic, atraumatic.  Her sclerae are nonicteric.  Mucous membranes are moist.  LUNGS:  Clear.  HEART:  Regular rate.  S1 and S2.  She has no murmurs, gallops, or rubs.  PMI is nondisplaced.  ABDOMEN:  Relatively benign.  She is moderately obese.  There is no  hepatosplenomegaly.  EXTREMITIES:  She has trace edema in her right leg which is chronic for.  There is no edema in the left leg.  There is no clubbing or cyanosis.  There is no rash.  Her gait was not assessed.  NEUROLOGIC:  Nonfocal.  Motor and sensory function intact.   EKG reveals normal sinus rhythm.  She has left ventricular hypertrophy  with T-wave abnormalities.   Her cardiac enzymes  are negative x3 sets.  The glucose levels have been  fairly high.  Accu-Cheks this morning were 341 and 226.  Her sodium is  134, potassium is 4.3, chloride is 104, CO2 is 25, glucose on the chem-7  was 321, creatinine 1.17.  White blood cell count 8.1, hemoglobin was  11.5, hematocrit is 34.8, MCV is 77.3.   Alexis [name] presents with some rather atypical episodes of chest pain.  These episodes are intermittent and are not really associated with any  exertion.  They last for 5 or 10 seconds.  We had a discussion regarding  the fact that these were probably atypical and most likely related to a  GI etiology.  We will go ahead and let her eat today.  We have started  her on Prevacid 30 mg a day.  She has had intermittent episodes of chest  pain for the past 3 days that she has been here despite that her enzymes  are negative.  We will go ahead and feed her and anticipate that she may  be able to go home later today.  We will see her as an outpatient for  followup visit.   Her diabetes is very poorly controlled at this point.  She will  obviously need greater glucose control.  It also sounds like she does  not eat a very specific diet.  She will need some counseling regarding  that.   Blood pressure is moderately elevated today.  We will give her some  additional medications.  She is already on max dose of Benicar.  We will  start her on some Norvasc 5 mg a day.      Thayer Headings, M.D.  Electronically Signed     PJN/MEDQ  D:  04/21/2008  T:  04/21/2008  Job:  [id number]

## 2010-09-28 NOTE — Discharge Summary (Signed)
NAME:  Alexis [name], Alexis [name] NO.:  [id number]   MEDICAL RECORD NO.:  [id number]          PATIENT TYPE:  INP   LOCATION:  [id number]                         FACILITY:  [facility]   PHYSICIAN:  Thayer Headings, M.D. DATE OF BIRTH:  Jul 06, 1945   DATE OF ADMISSION:  08/07/2008  DATE OF DISCHARGE:  08/08/2008                               DISCHARGE SUMMARY   DISCHARGE DIAGNOSES:  1. Noncardiac chest pain.  2. History of diabetes mellitus.  3. History of hypertension.  4. Morbid obesity.   DISCHARGE MEDICATIONS:  1. Humalog 75/25 twice a day as directed by Dr. Jeanie Cooks.  2. Metformin 500 mg twice a day.  3. Benicar 40 mg a day.  4. Hydrochlorothiazide 25 mg a day.  5. Aspirin 81 mg a day.  6. Coreg 12.5 mg twice a day.  7. Nitroglycerin 0.4 mg sublingually as needed for chest pain.   DISPOSITION:  The patient will see Dr. Acie Fredrickson in about 2 weeks.  We will  get a stress Cardiolite study.   HISTORY:  1. [name] is a 65 year old female with a history of coronary      artery disease.  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  She has good filling via left-to-right collaterals.  She      was admitted with episodes of chest pain.   [name] did not have any further episodes of chest pain.  Her  episodes were fairly atypical and were only feeding.  They were not  really consistent with angina.  We will schedule her back for a stress  Cardiolite study.  She did not have any episodes of pain while  ambulating the hall.  I will give her some nitroglycerin just in case  she has episodes of chest pain that resolved.  We will schedule her for  an outpatient Cardiolite study.   Her left ventricular systolic function is a mildly depressed with an  ejection fraction of 45%.  1. Diabetes mellitus.  The patient will follow up with her general      medical doctor for further recommendations regarding her diabetic      diet and medications.  She is to call us if she  has any other      problems.      Thayer Headings, M.D.  Electronically Signed     Thayer Headings, M.D.  Electronically Signed    PJN/MEDQ  D:  08/08/2008  T:  08/09/2008  Job:  [id number]   cc:   Nolene Ebbs, M.D.

## 2010-09-28 NOTE — H&P (Signed)
NAME:  MAYBELL, LAFRENIER NO.:  [id number]   MEDICAL RECORD NO.:  [id number]          PATIENT TYPE:  INP   LOCATION:  3710                         FACILITY:  [facility]   PHYSICIAN:  Benito Mccreedy, M.D.DATE OF BIRTH:  03-08-1946   DATE OF ADMISSION:  04/19/2008  DATE OF DISCHARGE:                              HISTORY & PHYSICAL   CHIEF COMPLAINT:  Chest pain.   Ms. Guffy is a 65 year old African American lady who presented with 2  episodes of intermittent chest pain lasting for about an hour today.  Pains were said to be sternal, nonradiating, pressure and tightness in  character, moderate in intensity.  There is no known aggravating or  alleviating factor, pain started while the patient was resting at home.  The patient has a prior history of CAD status post stent placement about  5 years ago.  In the emergency room, the patient was reviewed by ED  physician who felt that the patient has had an EKG change.  She is chest  pain free at that time.  I recommended the patient be admitted to be  ruled out and in this regard.   PAST MEDICAL HISTORY:  1. Possible history of CAD, status post MI, status post stent      placement about 5 years ago.  2. History of hypertension, diabetes mellitus, and breast cancer.  3. She is status post hysterectomy, mastectomy, tubal ligation, and      left arm surgery.   FAMILY HISTORY:  Positive for diabetes mellitus and hypertension.  The  patient does not smoke cigarette or drink alcohol.  She quit smoking  about 5 years ago after her myocardial infarction.   She does not have any medication allergies.   CURRENT MEDICATIONS:  1. Benicar HCT 40/25 daily.  2. Coreg dose unknown.  3. Potassium chloride 20 mEq daily.  4. OxyContin b.i.d.  5. Lantus and Humalog.  Dose of above medications are unclear.   SOCIAL HISTORY:  The patient lives in her own home with a daughter.   REVIEW OF SYSTEMS:  The patient admits to dyspnea,  otherwise review of  systems was unremarkable on 10-point systemic inquiry.   PHYSICAL EXAMINATION:  VITAL SIGNS:  BP 160/75, heart rate 76, and  respirations 18.  O2 sat 97%.  GENERAL:  The patient is an [place] American lady lying in bed in no  acute distress.  HEENT:  Normocephalic and atraumatic.  Pupils were equal and reactive to  light.  Extraocular movements intact.  Oropharynx moist.  No exudation.  NECK:  Supple.  No JVD.  LUNGS:  Vesicular breath sounds. No crackles.  CARDIAC:  No gallops.  ABDOMEN:  Soft.  Bowel sounds present.  EXTREMITIES:  No cyanosis, clubbing, or edema.  NEUROLOGIC:  She is alert and oriented x3.   LABORATORY DATA:  White count 11.3, hemoglobin 13.3, hematocrit 39.0,  and MCV is 77.2.  D-dimer 1.2.  Sodium 159, potassium 3.9, chloride 105,  glucose 236, BUN 10, and creatinine 1.4.  CK-MB 5.6, myoglobin 97, and  troponin I less than 0.05.   IMPRESSION:  1. Chest pain, in a diabetic patient with a history of coronary artery      disease, is admitted for serial cardiac enzymes, and Cardiology      evaluation for possible stress test, if cardiac enzymes are      negative.  2. Hyperglycemia with uncontrolled diabetes mellitus.  The patient      will be started back on her home medications and followed      accordingly with appropriate adjustments in her diabetic regimen.  3. Elevated D-dimer.  The patient's chest pains were of insidious      onset with evidence of acute chest pain or tachycardia or evidence      of acute pulmonary embolism, therefore no CT angiogram is      requested, especially in view of the fact that her creatinine is      1.4.      Benito Mccreedy, M.D.  Electronically Signed     GO/MEDQ  D:  04/19/2008  T:  04/20/2008  Job:  [id number]   cc:   Nolene Ebbs, M.D.

## 2010-09-30 ENCOUNTER — Emergency Department (HOSPITAL_COMMUNITY): Payer: Medicare Other

## 2010-09-30 ENCOUNTER — Inpatient Hospital Stay (HOSPITAL_COMMUNITY)
Admission: EM | Admit: 2010-09-30 | Discharge: 2010-10-04 | DRG: 293 | Disposition: A | Discharge status: Home / Self Care | Payer: Medicare Other | Attending: Internal Medicine | Admitting: Internal Medicine

## 2010-09-30 DIAGNOSIS — J984 Other disorders of lung: Secondary | ICD-10-CM | POA: Diagnosis present

## 2010-09-30 DIAGNOSIS — I129 Hypertensive chronic kidney disease with stage 1 through stage 4 chronic kidney disease, or unspecified chronic kidney disease: Secondary | ICD-10-CM | POA: Diagnosis present

## 2010-09-30 DIAGNOSIS — Z79899 Other long term (current) drug therapy: Secondary | ICD-10-CM

## 2010-09-30 DIAGNOSIS — I509 Heart failure, unspecified: Secondary | ICD-10-CM | POA: Diagnosis present

## 2010-09-30 DIAGNOSIS — E669 Obesity, unspecified: Secondary | ICD-10-CM | POA: Diagnosis present

## 2010-09-30 DIAGNOSIS — I2510 Atherosclerotic heart disease of native coronary artery without angina pectoris: Secondary | ICD-10-CM | POA: Diagnosis present

## 2010-09-30 DIAGNOSIS — I5023 Acute on chronic systolic (congestive) heart failure: Principal | ICD-10-CM | POA: Diagnosis present

## 2010-09-30 DIAGNOSIS — Z853 Personal history of malignant neoplasm of breast: Secondary | ICD-10-CM

## 2010-09-30 DIAGNOSIS — N189 Chronic kidney disease, unspecified: Secondary | ICD-10-CM | POA: Diagnosis present

## 2010-09-30 DIAGNOSIS — D509 Iron deficiency anemia, unspecified: Secondary | ICD-10-CM | POA: Diagnosis present

## 2010-09-30 DIAGNOSIS — K5900 Constipation, unspecified: Secondary | ICD-10-CM | POA: Diagnosis not present

## 2010-09-30 DIAGNOSIS — E119 Type 2 diabetes mellitus without complications: Secondary | ICD-10-CM | POA: Diagnosis present

## 2010-09-30 DIAGNOSIS — Z8249 Family history of ischemic heart disease and other diseases of the circulatory system: Secondary | ICD-10-CM

## 2010-09-30 DIAGNOSIS — Z9861 Coronary angioplasty status: Secondary | ICD-10-CM

## 2010-09-30 DIAGNOSIS — Z794 Long term (current) use of insulin: Secondary | ICD-10-CM

## 2010-09-30 DIAGNOSIS — I252 Old myocardial infarction: Secondary | ICD-10-CM

## 2010-09-30 LAB — CBC
HCT: 31.9 % — ABNORMAL LOW (ref 36.0–46.0)
Hemoglobin: 10.6 g/dL — ABNORMAL LOW (ref 12.0–15.0)
MCHC: 33.2 g/dL (ref 30.0–36.0)
RBC: 4.4 MIL/uL (ref 3.87–5.11)

## 2010-09-30 LAB — COMPREHENSIVE METABOLIC PANEL
Alkaline Phosphatase: 60 U/L (ref 39–117)
BUN: 31 mg/dL — ABNORMAL HIGH (ref 6–23)
CO2: 18 mEq/L — ABNORMAL LOW (ref 19–32)
Chloride: 105 mEq/L (ref 96–112)
Creatinine, Ser: 1.8 mg/dL — ABNORMAL HIGH (ref 0.4–1.2)
GFR calc non Af Amer: 28 mL/min — ABNORMAL LOW (ref >60)
Glucose, Bld: 120 mg/dL — ABNORMAL HIGH (ref 70–99)
Potassium: 4.8 mEq/L (ref 3.5–5.1)
Total Bilirubin: 0.3 mg/dL (ref 0.3–1.2)

## 2010-09-30 LAB — CARDIAC PANEL(CRET KIN+CKTOT+MB+TROPI)
CK, MB: 5.6 ng/mL — ABNORMAL HIGH (ref 0.3–4.0)
Total CK: 402 U/L — ABNORMAL HIGH (ref 7–177)
Troponin I: <0.3 ng/mL (ref <0.30)

## 2010-09-30 LAB — POCT CARDIAC MARKERS
Myoglobin, poc: 379 ng/mL (ref 12–200)
Troponin i, poc: <0.05 ng/mL (ref 0.00–0.09)

## 2010-09-30 LAB — PRO B NATRIURETIC PEPTIDE: Pro B Natriuretic peptide (BNP): 6385 pg/mL — ABNORMAL HIGH (ref 0–125)

## 2010-09-30 LAB — GLUCOSE, CAPILLARY
Glucose-Capillary: 125 mg/dL — ABNORMAL HIGH (ref 70–99)
Glucose-Capillary: 79 mg/dL (ref 70–99)
Glucose-Capillary: 153 mg/dL — ABNORMAL HIGH (ref 70–99)

## 2010-09-30 NOTE — H&P (Signed)
NAME:  Alexis [name], Alexis [name] NO.:  [id number]  MEDICAL RECORD NO.:  [id number]           PATIENT TYPE:  E  LOCATION:  WLED                         FACILITY:  [facility]  PHYSICIAN:  [name] Plum, MD  DATE OF BIRTH:  [birth year]  DATE OF ADMISSION:  09/30/2010 DATE OF DISCHARGE:                             HISTORY & PHYSICAL   PRIMARY CARE PHYSICIAN:  Nolene Ebbs, MD  CHIEF COMPLAINT:  Cough on and off for about a month and shortness of breath of 1 day's duration.  HISTORY OF PRESENT ILLNESS:  The patient is a 65 year old obese African American female with history of coronary artery disease, status post cardiac cath plus stent, breast CA and chronic kidney disease, presenting to the emergency room with chronic cough for about a month duration and shortness of breath of 1 day's duration.  The patient claimed that for the past 1 month, she had been coughing on and off and occasionally cough is said to be productive of frothy sputum.  She, however, denied any history of chest pain.  She denied any shortness of breath.  She initially saw her primary care physician who treated her for bronchitis with antibiotics, however, cough persisted.  A day prior to presenting to the emergency room, the cough got progressively worse with associated shortness of breath and diaphoresis.  She claims she was nauseated and had 1 episode of vomiting.  She gave a history of chills. She denied any rigor.  She gave a history of PND and orthopnea and swelling of the lower extremity.  Associated with cough was also shortness of breath.  She denied any diarrhea or hematochezia.  No dysuria or hematuria, but symptoms were said to be getting progressively worse.  Hence, the patient presented to the emergency room to be evaluated.  PAST MEDICAL HISTORY: 1. Positive for coronary artery disease. 2. Prior MI. 3. Hypertension. 4. History of breast CA. 5. Diabetes mellitus. 6. Chronic kidney  disease. 7. Obesity.  PAST SURGICAL HISTORY: 1. Low back surgery. 2. Hysterectomy. 3. Tubal ligation. 4. Breast CA status post left mastectomy. 5. Left arm fracture status post plate and screw. 6. Right tibia fracture status post open reduction and internal     fixation.  PREADMISSION MEDICATIONS: 1. Zithromax 1 tablet p.o. daily. 2. Amlodipine 10 mg p.o. daily. 3. Novolin 70/30 insulin 8 units subcu q.a.m. 4. Novolin insulin 70/30 25 units subcu q.h.s.  ALLERGIES:  She has no known allergies.  SOCIAL HISTORY:  Negative for alcohol, tobacco use.  FAMILY HISTORY:  Positive for breast CA, coronary artery disease and CVA.  REVIEW OF SYSTEMS:  The patient denies any history of headaches or blurred vision.  Complained of cough that is productive of frothy sputum with associated shortness of breath with PND and orthopnea.  Also complained of nausea, 1 episode of vomiting.  She denied any fever or chills.  No abdominal discomfort.  No diarrhea or hematochezia.  No dysuria or hematuria.  Has swelling of the lower extremity, worse on theright than on the left.  No intolerance to heat or cold and no neuropsychiatric disorder.  PHYSICAL EXAMINATION:  GENERAL:  Slightly elderly lady, not in any obvious respiratory distress, has two-pillow orthopnea. VITAL SIGNS:  Blood pressure is 177/91, pulse is 101, respiratory rate is 20, temperature is 98.4. HEENT:  Pallor but pupils are reactive to light and extraocular muscles are intact. NECK:  No jugular venous distention.  No carotid bruit.  No lymphadenopathy. CHEST:  Decreased breath sounds globally with rales at the lung bases. HEART:  Sounds are 1 and 2, tachycardic. ABDOMEN:  Obese.  Organs difficult to palpate.  Nontender.  Bowel sounds positive. EXTREMITIES:  Edema, pedal edema worse on the right than on the left. NEUROPSYCHIATRIC:  Unremarkable. SKIN:  Normal turgor. MUSCULOSKELETAL:  Arthritic changes in the knees and  feet.  LABORATORY DATA:  Initial complete blood count with no differential showed WBC of 11.9, hemoglobin of 10.6, hematocrit of 31.9, MCV of 72.5, platelet count of 284,000.  First set of cardiac markers, troponin I less than 0.05, CK-MB 9.7, pro BNP 6385.  Comprehensive metabolic panel not ordered.  EKG showed sinus tachycardia at the rate of 91 beats per minute with incomplete left bundle-branch block.  Imaging studies include chest x-ray which showed predominant distal marking out of proportion to the lower lung infilterate suspicious for bilateral/atypical pneumonia.  A 2-D echo done in December of 2009 showed normal left ventricular size, EF of 45% and akinesis of apical and midinferior wall and severe hypokinesis of the mid-distal posterolateral wall.  ADMITTING IMPRESSION: 1. Congestive heart failure (acute systolic dysfunction). 2. Coronary artery disease status post cardiac cath plus stent. 3. Chronic kidney disease. 4. Diabetes mellitus. 5. Hypertension. 6. History of breast carcinoma. 7. Questionable pneumonia. 8. Obesity. 9. Anemia (microcytic).  PLAN:  Plan is to admit the patient to Telemetry.  The patient will be saline locked.  He will be on O2 via nasal cannula 3 L per minute, aspirin 325 mg p.o. daily and nitroglycerin 0.4 mg sublingual p.r.n. for chest pain.  Other medication to be given to the patient include Lopressor 50 mg p.o. b.i.d., BiDil 1 p.o. t.i.d., and Lasix 40 mg IV q.12 h.  The patient will empirically be treated for pneumonia with Rocephin 1 g IV q.24 h., Zithromax 500 mg IV q.24 h.  She will be on albuterol and Atrovent nebs q.6 h. p.r.n. for shortness of breath and wheezing.  She will also be placed on Accu-Cheks t.i.d. with before breakfast and at bedtime with regular insulin sliding scale (moderate scale) and GI prophylaxis will be with Protonix 40 mg IV q.24 h., Lovenox 40 mg subcu q.24 h. for DVT prophylaxis.  Further workup to be done on  this patient includes CMP stat, cardiac enzymes q.6 h. x3, blood culture x2 before starting IV antibiotics, hemoglobin A1c, CBC, CMP and magnesium will be repeated in a.m.  The patient will have a D-dimer stat done and if positive, we will order CT thorax with PE protocol.  In addition, the patient will also have  a 2-D echo done in this admission. She will be followed and evaluated on day-to-day basis.     [name] Plum, MD     CN/MEDQ  D:  09/30/2010  T:  09/30/2010  Job:  [id number]  Electronically Signed by [name] Plum  on 09/30/2010 07:03:18 PM

## 2010-10-01 DIAGNOSIS — I059 Rheumatic mitral valve disease, unspecified: Secondary | ICD-10-CM

## 2010-10-01 HISTORY — PX: TRANSTHORACIC ECHOCARDIOGRAM: SHX275

## 2010-10-01 LAB — DIFFERENTIAL
Basophils Absolute: 0 10*3/uL (ref 0.0–0.1)
Lymphocytes Relative: 23 % (ref 12–46)
Monocytes Absolute: 0.7 10*3/uL (ref 0.1–1.0)
Monocytes Relative: 7 % (ref 3–12)
Neutro Abs: 5.6 10*3/uL (ref 1.7–7.7)

## 2010-10-01 LAB — GLUCOSE, CAPILLARY: Glucose-Capillary: 164 mg/dL — ABNORMAL HIGH (ref 70–99)

## 2010-10-01 LAB — COMPREHENSIVE METABOLIC PANEL
ALT: 16 U/L (ref 0–35)
AST: 19 U/L (ref 0–37)
Albumin: 2.8 g/dL — ABNORMAL LOW (ref 3.5–5.2)
CO2: 21 mEq/L (ref 19–32)
Calcium: 8.5 mg/dL (ref 8.4–10.5)
GFR calc Af Amer: 25 mL/min — ABNORMAL LOW (ref >60)
GFR calc non Af Amer: 20 mL/min — ABNORMAL LOW (ref >60)
Sodium: 136 mEq/L (ref 135–145)
Total Protein: 6.6 g/dL (ref 6.0–8.3)

## 2010-10-01 LAB — CARDIAC PANEL(CRET KIN+CKTOT+MB+TROPI)
Relative Index: 1.4 (ref 0.0–2.5)
Total CK: 361 U/L — ABNORMAL HIGH (ref 7–177)

## 2010-10-01 LAB — MAGNESIUM: Magnesium: 2.3 mg/dL (ref 1.5–2.5)

## 2010-10-01 LAB — CBC
HCT: 27.9 % — ABNORMAL LOW (ref 36.0–46.0)
Hemoglobin: 9.3 g/dL — ABNORMAL LOW (ref 12.0–15.0)
MCH: 24.2 pg — ABNORMAL LOW (ref 26.0–34.0)
MCHC: 33.3 g/dL (ref 30.0–36.0)
RBC: 3.84 MIL/uL — ABNORMAL LOW (ref 3.87–5.11)

## 2010-10-01 MED ORDER — IOHEXOL 300 MG/ML  SOLN
100.0000 mL | Freq: Once | INTRAMUSCULAR | Status: AC | PRN
  Administered 2010-10-01: 100 mL via INTRAVENOUS

## 2010-10-01 NOTE — H&P (Signed)
[facility]  Patient:    Alexis [name], Alexis [name]                       MRN: [id number] Adm. Date:  [id number] Attending:  Nicholaus Bloom CC:         Claudina Lick, P.A.-C., [place]   History and Physical  FOLLOW-UP EVALUATION:  Mikalah comes in for follow-up evaluation of her diabetic peripheral neuropathy with associated pain.  Since her previous evaluation, the patient has noted that she has done better.  She has new shoes, and she has less calluses.  Her podiatrist put her to rest for a month, and this helped with her feet considerably.  She is down to Oxycontin 10 mg one twice a day and does feel as though she is breaking through a little bit with this, but she seems to be tolerating it since she is back on the amitriptyline.  PHYSICAL EXAMINATION:  VITAL SIGNS:  Blood pressure is 148/83, heart rate is 93, respiratory rate is 18, O2 saturation is 97%, pain level is 6 out of 10.  NEUROLOGIC/MUSCULOSKELETAL:  She exhibits no change with regard to her sensory neuropathy, with ongoing calluses which seem to be improving over the plantar aspects of her feet.  IMPRESSION: 1. Peripheral neuropathy on the basis of diabetes. 2. Diabetes mellitus per Dr. Ardine Bjork. 3. Hypertension per Dr. Ardine Bjork.  DISPOSITION: 1. Continue on OxyContin 10 mg one p.o. b.i.d., #60 with no refill. 2. Increase amitriptyline to 50 mg two p.o. q.p.m. 3. Continue other medications per Dr. Ardine Bjork. 4. Follow up with me in eight weeks.  She is to let us know in about    three weeks so that we can give her a prescription for her OxyContin. DD:  12/24/99 TD:  12/25/99 Job: [id number]

## 2010-10-01 NOTE — Op Note (Signed)
NAME:  GELINA, HENNINGER                          ACCOUNT NO.:  [id number]   MEDICAL RECORD NO.:  [id number]                   PATIENT TYPE:  OIB   LOCATION:  5006                                 FACILITY:  [facility]   PHYSICIAN:  Anderson Malta, M.D.                 DATE OF BIRTH:  05-Sep-1945   DATE OF PROCEDURE:  12/23/2003  DATE OF DISCHARGE:  12/24/2003                                 OPERATIVE REPORT   PREOPERATIVE DIAGNOSIS:  Left arm radial shaft fracture.   POSTOPERATIVE DIAGNOSIS:  Left arm radial shaft fracture.   PROCEDURE:  Left arm open reduction and internal fixation of two week old  radial shaft fracture with shortening and mild disruption of the radial  ulnar joint.   SURGEON:  Anderson Malta, M.D.   ASSISTANT:  Lind Guest. Ninfa Linden, M.D.   ANESTHESIA:  General endotracheal anesthesia.   ESTIMATED BLOOD LOSS:  25 mL.   TOURNIQUET TIME:  70 minutes at 250 mmHg.   PROCEDURE IN DETAIL:  The patient was brought to the operating room where  general endotracheal anesthesia was induced.  IV antibiotics were  administered.  The left arm was prepped with sterile prepping solution and  draped in a sterile manner.  Charlie Pitter was used to cover the operative field.  The arm was elevated and exsanguinated with the Esmarch and the tourniquet  was inflated.  The volar of Mallie Mussel approach to the arm was utilized.  The  skin and subcutaneous tissue were sharply divided over the FCR tendon.  The  tendon was mobilized radially.  The floor of the FCR tendon sheath was also  incised.  The pronator quadratus was mobilized and incised off the radial  border of the shaft.  The fascia was identified.  The soft tissue was  debrided from the fascial edges.  Following debridement of the fracture  edges, the fracture was reduced.  A 3/5 locking plate was then applied with  anatomic reduction achieved.  The wrist was also checked and the range was  found to be out to length.  The screw length was  confirmed in the AP and  lateral planes under fluoroscopic guidance.  The radial bow was restored.  Following reduction and plating, the tourniquet was released and bleeding  points encountered were controlled using bipolar electrocautery.  The skin  was closed using interrupted inverted 3-0 Vicryl suture and 3-0 nylon  suture.  The patient was placed in a volar splint.  She tolerated the  procedure well without any immediate complications.  A TLS drain was placed.                                               Anderson Malta, M.D.    GSD/MEDQ  D:  01/08/2004  T:  01/09/2004  Job:  [id number]

## 2010-10-01 NOTE — Cardiovascular Report (Signed)
NAME:  Alexis [name], Alexis [name]                          ACCOUNT NO.:  [id number]   MEDICAL RECORD NO.:  [id number]                   PATIENT TYPE:  OUT   LOCATION:  DAY                                  FACILITY:  [facility]   PHYSICIAN:  Thayer Headings, M.D.              DATE OF BIRTH:  1946-01-19   DATE OF PROCEDURE:  12/19/2003  DATE OF DISCHARGE:  12/18/2003                              CARDIAC CATHETERIZATION   PROCEDURE:  Left heart catheterization with coronary angiography.   CARDIOLOGIST:  Thayer Headings, M.D.   INDICATIONS:  Ms. Tubbs is a 65 year old female with a history of coronary  artery disease.  She is status post inferior myocardial infarction several  years ago in [place].  She underwent successful percutaneous transluminal  coronary angioplasty and stenting of that vessel.  She has done fairly well  and has not had any symptoms.  She recently fell and broke her arm.  As part  of preoperative evaluation prior to arm surgery, she was sent to our office  for evaluation.  She had a stress Cardiolite study which was abnormal.  She  was referred for heart catheterization because of these abnormalities.   DESCRIPTION OF PROCEDURE:  The right femoral artery was easily cannulated  using the modified Seldinger technique.   HEMODYNAMICS:  The LV pressure was 170/14 with an aortic pressure of 170/83.   ANGIOGRAPHY:  1. The left main is smooth and normal.  2. The left anterior descending artery has mild to moderate stenosis in the     proximal left anterior descending.  This stenosis is approximately 30%.     There are minor luminal irregularities throughout the remainder of the     left anterior descending.  3. The first diagonal vessel is a moderate size vessel with 30-40% stenosis.  4. The left circumflex artery is a moderate to large vessel.  There is a     large first obtuse marginal artery that branches.  The inferior most     branch has a 90% stenosis.  This branch is  approximately 1.5-2 mm in     diameter and is not an ideal candidate for stenting because of its small     size and the diffuse nature of the blockage.  We certainly would not be     able to fit any of the new drug eluding stents and a small metal stent     would likely occlude.  5. The right coronary artery is proximally occluded just prior to the stent.     There is a long area of occlusion in the distal and mid right coronary     artery filled via left to right collaterals.   LEFT VENTRICULOGRAM:  The left ventriculogram was performed in a 30 RAO  position.  It reveals inferior wall hypokinesis.  The ejection fraction is  approximately 50%.   COMPLICATIONS:  None.  CONCLUSIONS:  1. The patient has a history of an inferior lateral myocardial infarction.     The right coronary artery stent is occluded, and it appears to be     chronic.  I do not think that we will be successful in opening this up     total occlusion.  In addition, she is completely asymptomatic and the     Cardiolite scan reveals a completed scar in this region.  2. She does have evidence of anterior lateral ischemia on the Cardiolite     scan, and this is most likely due to this small branch of the obtuse     marginal artery.  This vessel is fairly small and although we could     attempt angioplasty I think that the risk of reclosure is quite high.  It     certainly would not decrease angioplasty of this vessel but certainly     would not decrease the risks of perioperative myocardial infarction with     her upcoming arm surgery.  I think that we need to continue with medical     therapy for this lesion.  She is at some increased risk for perioperative     myocardial infarction, but the area supplied by this vessel is quite     small and I think that she could be managed medically through this.  We     will discuss these results with Dr. Marlou Sa.                                               Thayer Headings,  M.D.    PJN/MEDQ  D:  12/19/2003  T:  12/20/2003  Job:  [id number]   cc:   G. Alphonzo Severance, M.D.  Fax: [phone]

## 2010-10-01 NOTE — Op Note (Signed)
[place]  Patient:    [name], [name] Visit Number: [id number] MRN: [id number]          Service Type: DSU Location: [facility] Attending Physician:  Clare Charon Dictated by:   Leonides Sake Lucia Gaskins, M.D. Proc. Date: 06/12/01 Admit Date:  06/12/2001                             Operative Report  PREOPERATIVE DIAGNOSIS:  Submental lymph node enlargement.  POSTOPERATIVE DIAGNOSIS:  Submental lymph node enlargement.  PROCEDURE:  Excisional biopsy of submental lymph node x2.  SURGEON:  Leonides Sake. Lucia Gaskins, M.D.  ANESTHESIA:  Local 1% Xylocaine with 1:100,000 epinephrine.  COMPLICATIONS:  None.  BRIEF CLINICAL NOTE:  Alexis [name] is a 65 year old female who has had a previous history of breast cancer.  She has had an enlargement of a submental lymph node now for several months that measures approximately 1.5-2 cm in size and actually has two palpable submental nodes.  She is taken to the operating room at this time for excision of submental lymph nodes.  DESCRIPTION OF PROCEDURE:  The patients submental area was prepped with Betadine solution and was then injected with 4 cc of Xylocaine with epinephrine.  A transverse incision was made directly over the larger node. Dissection was carried down through the subcutaneous tissue and two lymph nodes were identified and excised from the submental area and sent in saline to pathology.  Hemostasis was obtained with the cautery, and the defect was closed with 3-0 chromic suture subcutaneously and 5-0 nylon on the skin.  Alexis [name] tolerated the procedure well and is discharged home on Tylenol and Tylenol No. 3 p.r.n. pain, Keflex 500 mg b.i.d. for five days.  We will have her follow up in my office in one week for recheck, to review pathology and have sutures removed.  Of note, Alexis [name] has poorly-controlled diabetes, is on insulin, and because of elevated blood sugar was recommended to follow up with  a medical physician concerning this. Dictated by:   Leonides Sake Lucia Gaskins, M.D. Attending Physician:  Clare Charon DD:  06/12/01 TD:  06/12/01 Job: [id number]

## 2010-10-01 NOTE — H&P (Signed)
NAME:  Alexis [name], Alexis [name]                          ACCOUNT NO.:  [id number]   MEDICAL RECORD NO.:  [id number]                   PATIENT TYPE:  OIB   LOCATION:                                       FACILITY:  [facility]   PHYSICIAN:  Thayer Headings, M.D.              DATE OF BIRTH:  [birth year]   DATE OF ADMISSION:  12/19/2003  DATE OF DISCHARGE:                                HISTORY & PHYSICAL   Patient is a middle-age female with a history of coronary artery disease.  She had PTCA stenting in [place] 2 years ago.  She recently fell and  broker her arm and is scheduled for arm surgery.  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  We have scheduled this heart catheterization for further  evaluation of this area of ischemia, prior to her arm surgery.   The patient has not had any episodes of chest pain or shortness of breath.  She did have PTCA 2 years ago at the time of a heart attack.  Her catheter  note indicates that she had moderate disease in a diagonal branch at that  time.  She had mild-to-moderate irregularities in some other vessels but did  not have any other critical lesions that needed to be angioplastied.  She  has not had any episodes of chest pain or shortness of breath.  She fell  approximately 1 week ago and had severe fracture of her left arm.  She has  had her arm in a splint, but needs to have surgery fairly soon.   CURRENT MEDICATIONS:  1. Toprol XL 100 mg a day.  2. Lotensin HCT 20 mg/25 mg once a day.  3. Humalog 35 units in the morning.  4. Lantus insulin 50 units q.h.s.  5. Norvasc 5 mg a day.   ALLERGIES:  None.   PAST MEDICAL HISTORY:  1. Coronary artery disease.  She is status post PTCA and stenting of her     right coronary artery in [place] in 2003.  At that time she was also     noted to have an LAD lesion of  40-50% in the proximal vessel.  She had 2     diagonal.  The first diagonal had a 40% narrowing and the second diagonal     had a 60-70% narrowing.  The circumflex artery mainly consisted of a high     obtuse marginal.  This vessel had a 40-50% narrowing.  The right coronary     artery vessel was originally occluded, but she was angioplastied and a     2.5 x 23 mm CYPHER stent was placed in the vessel.  It was post-dilated     using a 2.5 mm power sail balloon up to 18 atmospheres.  She had  good     distal flow following the angioplasty.  2. Insulin-dependent diabetes mellitus.  3. History of breast cancer -- treated with chemotherapy and a left     mastectomy.  4. Hypertension.  5. Hypercholesterolemia.   SOCIAL HISTORY:  The patient used to smoke, but quit last year. She does not  drink alcohol.  She drinks several liters of sodas a day.   FAMILY HISTORY:  Her father died at age 53 due to a gunshot wound.  Her  mother died at age 28 due to breast cancer.   REVIEW OF SYSTEMS:  Was reviewed and is essentially negative.   PHYSICAL EXAMINATION:  GENERAL:  On exam she is a middle-age female in no  acute distress.  She is alert and oriented x3 and her mood and affect are  normal.  VITAL SIGNS:  Her blood pressure is 25/82.  Her heart rate is 70.  HEENT AND NECK:  Exam reveals 2+ carotids.  She has no bruits.  There is no  JVD and no thyromegaly.  LUNGS:  Clear to auscultation.  HEART:  Regular rate, S1-S2 with no murmurs, gallops, or rubs.  ABDOMEN EXAM:  Reveals good bowel sounds and is nontender.  EXTREMITIES:  She has no clubbing, cyanosis, or edema.  NEUROLOGIC EXAM:  Nonfocal.   Her Cardiolite scan reveals an inferolateral scar which is most likely due  to the inferior wall myocardial infarction that she had 2 years ago.  She  also has an area of anterolateral ischemia which is either caused by the  second diagonal vessel or perhaps an obtuse marginal distribution.   I have  discussed the case with Dr. Anderson Malta as well as the  anesthesiologist.  Originally we were hoping that we could do a regional  block to fix her arm, but apparently this is not satisfactory.  She would be  at moderate-to-high risk for general anesthetic given her abnormal  Cardiolite scan.  We will proceed with heart catheterization for further  risk stratification.  I have discussed the case with Dr. Marlou Sa and he  understands that he may have to fix her broken arm while she is on full dose  aspirin and Plavix if we need to place a stent in one of the vessels. He  understands and agrees to proceed with arm surgery even if this is the case.  We will proceed with heart catheterization tomorrow.                                                Thayer Headings, M.D.    PJN/MEDQ  D:  12/18/2003  T:  12/18/2003  Job:  [id number]   cc:   G. Alphonzo Severance, M.D.  Fax: [phone]

## 2010-10-01 NOTE — Consult Note (Signed)
[facility]  Patient:    Alexis [name], Alexis [name]                       MRN: [id number] Proc. Date: 10/01/99 Adm. Date:  [id number] Attending:  Nicholaus Bloom CC:         Dorris Fetch, M.D., [facility], [place]                          Consultation Report  FOLLOW UP EVALUATION  INTERVAL HISTORY:  Lasey Beldon comes in for follow up evaluation of her diabetic peripheral neuropathy.  Since her previous evaluation, the patient has noted lessening of her pain.  She has run out of her Elavil and noted that since then she has had a slight increase in her discomfort, but she is down to 20 mg twice a day of the Oxycontin.  She wishes to try and get off of this. She continues to see the podiatrist approximately twice a month to get her calluses shaved back, as well as her nails taken care of.  She has noted that she has markedly improved sensation of her feet after more tight control of her diabetes there in [place], [place].  EXAMINATION:  VITAL SIGNS:  Blood pressure 139/70, heart rate 100, respiratory rate 18, O2 saturations 98%, pain level 5 out of 10, temperature 98.2.  NEUROLOGIC:  Sensation reveals improving perception of pinprick and auditory sense over the legs.  She continues to have calluses along the plantar aspects of her feet.  IMPRESSION: 1. Peripheral neuropathy, on the basis of diabetes. 2. Diabetes mellitus, per primary care physician. 3. Hypertension, per primary care physician.  DISPOSITION: 1. Reduce Oxycontin to 10 mg one p.o. t.i.d. #90, for the next four weeks.    If she is doing well reduce this on down to 10 mg b.i.d. for the next    four weeks. 2. Continue on amitriptyline 50 mg one p.o. q.p.m. #60 with two refills. 3. Follow up with me in eight weeks.  She is to contact us in about three    weeks for another prescription for her Oxycontin.DD:  10/01/99 TD:  10/06/99 Job: [id number]

## 2010-10-01 NOTE — H&P (Signed)
[facility]  Patient:    Alexis [name], Alexis [name] Visit Number: [id number] MRN: [id number]          Service Type: Attending:  Nicholaus Bloom, M.D. Adm. Date:  12/29/00   CC:         Marylyn Ishihara L. Christella Noa, M.D.  Dr. [facility]   History and Physical  FOLLOW-UP EVALUATION  INTERVAL HISTORY:  Kayron comes in for follow-up evaluation of her peripheral neuropathy on the basis of diabetes.  She notes that the OxyContin at 20 mg twice a day is not as efficacious as at 40 mg twice a day.  She rates her pain at a level of 10/10 today.  Unfortunately, she did run out of her medication two days ago, so it is hard to really interpret where she is at with regard to this.  I suspect that in reviewing her records, she does need to be on a higher dose, and she had asked to go down on it because of concerns about the news and risks associated with the OxyContin.  Her other medications include cephalexin, insulin, and Novolin.  PHYSICAL EXAMINATION:  VITAL SIGNS:  Blood pressure 175/91, heart rate is 104, respiratory rate is 18, O2 saturation is 99%, pain level is 10/10.  NEUROLOGIC:  She exhibits no change in her neurologic exam.  IMPRESSION: 1. Diabetic peripheral neuropathy with associated pain. 2. Other medical problems per Dr. Irving Shows, her primary care physician.  DISPOSITION: 1. Increase OxyContin to 40 mg one p.o. b.i.d., #60 with no refill. 2. Follow up with me in eight weeks. 3. Continue other medications. 4. The patient plans to follow up with me at the [place] address. DD:  12/29/00 TD:  12/29/00 Job: [id number]

## 2010-10-01 NOTE — Op Note (Signed)
NAME:  LUGARDA, IGNACIO                          ACCOUNT NO.:  [id number]   MEDICAL RECORD NO.:  [id number]                   PATIENT TYPE:  OIB   LOCATION:  5006                                 FACILITY:  [facility]   PHYSICIAN:  Anderson Malta, M.D.                 DATE OF BIRTH:  06/23/1945   DATE OF PROCEDURE:  12/23/2003  DATE OF DISCHARGE:  12/23/2003                                 OPERATIVE REPORT   PREOPERATIVE DIAGNOSIS:  Left forearm Galeazzi fracture, two weeks old.   POSTOPERATIVE DIAGNOSIS:  Left forearm Galeazzi fracture, two weeks old.   OPERATION PERFORMED:  Left forearm open reduction internal fixation of a  Galeazzi fracture of the radius.   SURGEON:  Anderson Malta, M.D.   ANESTHESIA:  General endotracheal.   ESTIMATED BLOOD LOSS:  50 mL.   DRAINS:  Hemovac times one.   TOURNIQUET TIME:  One hour 17 minutes at 250 mmHg.   DESCRIPTION OF PROCEDURE:  The patient was brought to the operating room  where general endotracheal anesthesia was induced.  Preoperative antibiotics  were administered.  The left arm was prepped with DuraPrep solution and  draped in sterile manner.  Charlie Pitter was used to cover the operative field.  The  arm was elevated, exsanguinated with Esmarch wrap, tourniquet was inflated.  Volar Henry approach to the arm was utilized.  Skin overlying the flexor  carpi radialis tendon was incised centered over the fracture site which was  localized under fluoroscopy.  FCR tendon was mobilized radially with the  radial artery, vena comitantes and the superficial radial nerve.  The  pronator quadratus was split on its radial aspect of the radius and the  periosteal dissection was carried distally to the fracture site.  Further  blunt dissection, muscle splitting was then performed proximally to the  fracture site.  The fracture site was visualized.  Fibrous intervening  tissue was removed.  Fracture edges were visualized.  The fracture was  reduced and plated  with an 8 hole, 35 locking plate.  A near anatomic  reduction was achieved, greater than 50% comminution was noted at the  fracture site although cortical interdigitation was achieved at the  noncomminuted fracture fragments.  Anatomic reduction was confirmed in the  AP and lateral planes under fluoroscopy.  Six locking screws were placed,  two nonlocking screws were placed.  Lag screw placement was not possible due  to fracture configuration and comminution.  At this time, the incision was  thoroughly irrigated.  The comminuted fracture site was packed with  demineralized bone matrix 1 mL.  Tourniquet was released.  Bleeding points  encountered were controlled using bipolar electrocautery.  Incision was  again irrigated and closed over Hemovac drain using interrupted inverted 3-0  Vicryl suture and interrupted simple 3-0 nylon sutures.  Bulky splint was  applied.  The patient tolerated the procedure well without immediate  complications.                                               Anderson Malta, M.D.    GSD/MEDQ  D:  12/23/2003  T:  12/24/2003  Job:  [id number]

## 2010-10-01 NOTE — H&P (Signed)
[facility]  Patient:    Alexis [name], Alexis [name]                       MRN: [id number] Adm. Date:  [id number] Attending:  Nicholaus Bloom CC:         Claudina Lick, P.A.C. [facility] in [place], [place]                         History and Physical  FOLLOW-UP EVALUATION:  [name] comes in for a follow-up evaluation of her peripheral neuropathy on the basis of diabetes. Since her previous evaluation, she notes that her pain comes and goes and she has felt some pain radiating from the right hip down to the leg suggestive of a radiculopathy. The 10 mg of OxyContin twice a day only partially relieves her pain. She discussed the fact that when she was taking the 20 mg twice a day it totally relieved her pain or down to a level 1-2/10. I advised her that reducing the dose because of fears of addiction was not justified and that if she got good responses at 20 mg twice a day and she was taking it appropriately there was no evidence that she was addicted. She has been walking a lot. She notes that her pain is made better by laying down. She has noted a marked improvement since getting the new shoes from Dr. Irving Shows.  CURRENT MEDICATIONS: 1. OxyContin 10 mg twice a day. 2. Diabetic medications and hypertensive medications.  PHYSICAL EXAMINATION:  VITAL SIGNS:  Blood pressure is 182/92, heart rate is 101, respiratory rate is 20, O2 saturation 100%, pain level is 8/10.  EXTREMITIES:  She demonstrates stocking distribution attenuation and pinprick over the lower extremities with deep tendon reflexes symmetric in the lower extremities.  IMPRESSION: 1. Peripheral neuropathy on the basis of diabetes. 2. Diabetes mellitus and hypertension per primary care physician.  DISPOSITION: 1. Increase OxyContin to 20 mg b.i.d., #60 with no refill. 2. I am uncertain whether she has continued on the amitriptyline but would    certainly encourage her to stay on this  if at all possible. 3. Other medications per Mr. Duncan Dull. at [facility]    in [place]. 4. Follow up with me in eight weeks. She is to let us know so we can mail    another prescription for the OxyContin. DD:  03/02/00 TD:  03/02/00 Job: [id number]

## 2010-10-02 LAB — DIFFERENTIAL
Lymphocytes Relative: 24 % (ref 12–46)
Lymphs Abs: 2.4 10*3/uL (ref 0.7–4.0)
Monocytes Absolute: 0.9 10*3/uL (ref 0.1–1.0)
Monocytes Relative: 9 % (ref 3–12)
Neutro Abs: 5.8 10*3/uL (ref 1.7–7.7)

## 2010-10-02 LAB — COMPREHENSIVE METABOLIC PANEL
ALT: 17 U/L (ref 0–35)
CO2: 21 mEq/L (ref 19–32)
Calcium: 8.9 mg/dL (ref 8.4–10.5)
Creatinine, Ser: 2.92 mg/dL — ABNORMAL HIGH (ref 0.4–1.2)
GFR calc non Af Amer: 16 mL/min — ABNORMAL LOW (ref >60)
Glucose, Bld: 179 mg/dL — ABNORMAL HIGH (ref 70–99)
Sodium: 139 mEq/L (ref 135–145)

## 2010-10-02 LAB — CBC
HCT: 26.8 % — ABNORMAL LOW (ref 36.0–46.0)
Hemoglobin: 8.7 g/dL — ABNORMAL LOW (ref 12.0–15.0)
MCH: 23.6 pg — ABNORMAL LOW (ref 26.0–34.0)
MCHC: 32.5 g/dL (ref 30.0–36.0)

## 2010-10-02 LAB — MAGNESIUM: Magnesium: 2.4 mg/dL (ref 1.5–2.5)

## 2010-10-02 LAB — GLUCOSE, CAPILLARY
Glucose-Capillary: 129 mg/dL — ABNORMAL HIGH (ref 70–99)
Glucose-Capillary: 168 mg/dL — ABNORMAL HIGH (ref 70–99)

## 2010-10-03 LAB — COMPREHENSIVE METABOLIC PANEL
Albumin: 2.8 g/dL — ABNORMAL LOW (ref 3.5–5.2)
Alkaline Phosphatase: 53 U/L (ref 39–117)
Calcium: 8.8 mg/dL (ref 8.4–10.5)
Creatinine, Ser: 3.12 mg/dL — ABNORMAL HIGH (ref 0.4–1.2)
GFR calc Af Amer: 18 mL/min — ABNORMAL LOW (ref >60)
GFR calc non Af Amer: 15 mL/min — ABNORMAL LOW (ref >60)
Sodium: 138 mEq/L (ref 135–145)
Total Bilirubin: 0.2 mg/dL — ABNORMAL LOW (ref 0.3–1.2)
Total Protein: 6.4 g/dL (ref 6.0–8.3)

## 2010-10-03 LAB — GLUCOSE, CAPILLARY
Glucose-Capillary: 175 mg/dL — ABNORMAL HIGH (ref 70–99)
Glucose-Capillary: 127 mg/dL — ABNORMAL HIGH (ref 70–99)

## 2010-10-03 LAB — CBC
Hemoglobin: 8.3 g/dL — ABNORMAL LOW (ref 12.0–15.0)
MCHC: 32.5 g/dL (ref 30.0–36.0)
Platelets: 240 10*3/uL (ref 150–400)
RDW: 15.3 % (ref 11.5–15.5)

## 2010-10-03 LAB — DIFFERENTIAL
Basophils Absolute: 0.1 10*3/uL (ref 0.0–0.1)
Basophils Relative: 1 % (ref 0–1)
Eosinophils Absolute: 0.9 10*3/uL — ABNORMAL HIGH (ref 0.0–0.7)
Eosinophils Relative: 10 % — ABNORMAL HIGH (ref 0–5)
Monocytes Absolute: 0.6 10*3/uL (ref 0.1–1.0)
Neutro Abs: 5.1 10*3/uL (ref 1.7–7.7)

## 2010-10-03 LAB — MAGNESIUM: Magnesium: 2.6 mg/dL — ABNORMAL HIGH (ref 1.5–2.5)

## 2010-10-04 LAB — COMPREHENSIVE METABOLIC PANEL
Albumin: 2.8 g/dL — ABNORMAL LOW (ref 3.5–5.2)
BUN: 45 mg/dL — ABNORMAL HIGH (ref 6–23)
CO2: 20 mEq/L (ref 19–32)
Chloride: 109 mEq/L (ref 96–112)
Creatinine, Ser: 3.08 mg/dL — ABNORMAL HIGH (ref 0.4–1.2)
GFR calc non Af Amer: 15 mL/min — ABNORMAL LOW (ref >60)
Glucose, Bld: 105 mg/dL — ABNORMAL HIGH (ref 70–99)
Total Bilirubin: 0.2 mg/dL — ABNORMAL LOW (ref 0.3–1.2)

## 2010-10-04 LAB — DIFFERENTIAL
Eosinophils Absolute: 0.8 10*3/uL — ABNORMAL HIGH (ref 0.0–0.7)
Eosinophils Relative: 8 % — ABNORMAL HIGH (ref 0–5)
Lymphs Abs: 2.2 10*3/uL (ref 0.7–4.0)

## 2010-10-04 LAB — CBC
HCT: 26.1 % — ABNORMAL LOW (ref 36.0–46.0)
MCH: 23.9 pg — ABNORMAL LOW (ref 26.0–34.0)
MCV: 71.7 fL — ABNORMAL LOW (ref 78.0–100.0)
Platelets: 230 10*3/uL (ref 150–400)
RBC: 3.64 MIL/uL — ABNORMAL LOW (ref 3.87–5.11)

## 2010-10-04 LAB — GLUCOSE, CAPILLARY: Glucose-Capillary: 127 mg/dL — ABNORMAL HIGH (ref 70–99)

## 2010-10-04 LAB — MAGNESIUM: Magnesium: 2.5 mg/dL (ref 1.5–2.5)

## 2010-10-04 NOTE — Discharge Summary (Signed)
NAME:  Alexis [name], Alexis [name] NO.:  [id number]  MEDICAL RECORD NO.:  [id number]           PATIENT TYPE:  I  LOCATION:  [id number]                         FACILITY:  [facility]  PHYSICIAN:  [name] Plum, MD  DATE OF BIRTH:  [birth year]  DATE OF ADMISSION:  09/30/2010 DATE OF DISCHARGE:  10/04/2010                        DISCHARGE SUMMARY - REFERRING   PRIMARY CARE PHYSICIAN:  Nolene Ebbs, MD  DISCHARGE DIAGNOSES: 1. Congestive heart failure (acute on chronic systolic dysfunction). 2. Coronary artery disease, status post cardiac cath plus stent. 3. Chronic kidney disease. 4. Anemia (microcytic). 5. Diabetes mellitus. 6. Hypertension. 7. History of breast cancer. 8. Obesity. 9. Pulmonary nodules.  BRIEF HISTORY AND PHYSICAL:  The patient is a 65 year old very pleasant, obese African American female with history of coronary artery disease, status post cardiac cath plus stent, and breast CA as well as chronic kidney disease, who was admitted to the hospital on Sep 30, 2010, by Dr. [name] Plum with 80-month history of on and off cough and shortness of breath of 1 day's duration.  The cough was said to be productive of a frothy sputum.  She denied any associated chest pain.  She denied initially any history of shortness of breath.  However, a day prior to presenting to the emergency room, the patient claims she became short of breath with associated diaphoresis.  She claims she was nauseated and had 1 episode of vomiting.  She denied any history of chills.  He denied any rigor.  She gave a history of PND and orthopnea and swelling of the lower extremities.  The cough and shortness of breath were said to be getting progressively worse, hence the patient presented to the hospital to be evaluated.  PREADMISSION MEDICATIONS: 1. Zithromax 250 mg 1 p.o. daily. 2. Amlodipine 10 mg p.o. daily. 3. Novolin 70/30 insulin 80 subcu q.a.m. and Novolin 70/30 insulin 25     units  subcu at bedtime.  ALLERGIES:  LISINOPRIL.  SOCIAL HISTORY:  Negative for alcohol or tobacco use.  FAMILY HISTORY:  Positive for breast CA, coronary artery disease and CVA.  PAST SURGICAL HISTORY: 1. Lower back surgery. 2. Hysterectomy. 3. Tubal ligation. 4. Breast CA, status post left mastectomy. 5. Left arm fracture, status post plate and screw. 6. Right tibia fracture, status post open reduction and internal     fixation.  REVIEW OF SYSTEMS:  Essentially as documented in the initial history and physical.  At the time the patient was seen by me, she was not in any obvious respiratory distress.  She, however, had 2 pillow orthopnea.  Vital Signs:  Blood pressure 177/91, pulse 101, respiratory rate is 20, temperature is 98.4.  HEENT:  Pallor, but pupils are reactive to light and extraocular muscles are intact.  Neck:  No jugular venous distention.  No carotid bruit.  No lymphadenopathy.  Chest:  Decreased breath sounds globally with rales at the lung bases.  Heart:  Sounds are 1 and 2, tachycardic.  Abdomen:  Obese, organs difficult to palpate, nontender.  Bowel sounds are positive.  Extremities:  Edema, worse on the right than on the left.  Neuropsychiatric:  Unremarkable.  Skin: Normal turgor.  Musculoskeletal:  Arthritic changes in the knees and in the feet.  LABORATORY DATA:  Initial complete blood count with no differential showed WBC of 11.9, hemoglobin of 10.6, hematocrit of 31.9, MCV of 72.5 with a platelet count of 284.  Four set of cardiac markers, troponin-I less than 0.05, less than 0.30, less than 0.30, and less than 0.30 respectively; CK-MB 9.7, 5.6, 5.6 and then 5.0 respectively.  Pro BNP 6385, D-dimer 1.85.  Hemoglobin A1c 7.4.  Blood culture, no growth x2. Magnesium level is 2.3.  A repeat complete blood count with no differential done on Oct 04, 2010, showed WBC of 9.2, hemoglobin 8.7, hematocrit 26.1, MCV of 91.7 with a platelet count of 230.   And comprehensive metabolic panel showed sodium of 139, potassium of 4.6, chloride of 109, with a bicarb of 20, glucose is 105, BUN is 45, creatinine 3.08.  Magnesium level is 2.5.  The initial EKG done on admission showed sinus tachycardia with a rate of 91 beats per minute with incomplete left bundle branch block.  IMAGING STUDIES: 1. Chest x-ray which showed increased bibasilar interstitial markings     out of proportion to the lower degree of lung volume suspicious for     viral/atypical pneumonia. 2. CT angiogram:  No pulmonary embolism, mild congestive heart     failure.  There is a small pulmonary nodule seen. 3. 2-D echo done on Oct 01, 2010, mild LVH, EF about 45% to 50% (grade     2 diastolic dysfunction).  There is akinesis of the inferoposterior     myocardium, consistent with pseudo normal left ventricular filling     pattern.  HOSPITAL COURSE:  The patient was admitted to telemetry.  She was placed on fluid restriction and bedrest.  The patient was given enteric-coated aspirin 325 mg p.o. daily, Lovenox 40 mg subcu q.24 h for DVT prophylaxis, and Protonix 40 mg IV q.24 for GI prophylaxis.  The patient was placed on O2 via nasal cannula.  She was also given nitroglycerin 0.4 mg sublingual p.r.n. for chest pain and also Lopressor 50 mg p.o. b.i.d.  Also added to the patient's regimen was Lasix 40 mg IV q.12 and also BiDil 1 tablet p.o. t.i.d.  She was also given albuterol and Atrovent nebs q.6 p.r.n. for shortness of breath and empirically treated for questionable pneumonia which was ultimately ruled out with Rocephin 1 g IV q.24 and Zithromax 500 mg IV q.24.  Also added to the patient's regimen was antitussive Robitussin 10 cc p.o. t.i.d. because of persistent cough; this eventually resolved.  The patient also complained about constipation and she was given Colace 100 mg p.o. b.i.d. p.r.n. She was ambulated without oxygen and pulse ox on room air was more than 95%.   Because of the patient's microcytosis, she was started on ferrous sulfate 325 mg p.o. t.i.d.  On Oct 03, 2010, IV Rocephin and Zithromax were discontinued since there was no evidence of pneumonia.  She was, however, continued on breathing treatments p.r.n. The patient was followed and evaluated by me on daily basis.  I  had an extensive discussion with the patient about the need for her to have a colonoscopy done as an outpatient to be arranged by her primary care doctor.  She verbalized understanding and she is also to be followed by the nephrologist for her chronic kidney disease.  Since chest x-ray showed multiple pulmonary nodules, the patient was also advised to have  a repeat CT of the chest done in 6 months' time.  So far she has remained clinically stable.  She was evaluated by me today, feels better.  Denies any chest pain.  Denies any shortness of breath.  Chest exam is unremarkable.  Examination of the lower extremities show almost complete resolution of the pedal edema.  Her vital signs today, which is Oct 04, 2010:  Blood pressure is 149/71, temperature is 98.4, pulse 84, respiratory rate 20, medically stable.  DISCHARGE PLAN:  The patient to be discharged home today on activity as tolerated, daily weight, fluid restriction.  She was advised to watch out for signs and symptoms of congestive heart failure, which include congestion in the lungs, shortness of breath and progressive swelling of the lower extremities.  Should she develop any of these symptoms, she will return to the emergency room to be evaluated.  The PCP, however, should do the following: 1. Arrange for nephrology followup. 2. GI followup for colonoscopy. 3. The patient is to have a repeat CT of the thorax done in 6 months     to follow up with pulmonary nodule.  MEDICATIONS TO BE TAKEN AT HOME: 1. Aspirin enteric-coated 325 mg 1 p.o. daily. 2. Ferrous sulfate 325 mg 1 p.o. t.i.d. with meals. 3. Isosorbide  dinitrate/hydralazine 20/37.5 (BiDil) 1 p.o. t.i.d. 4. Metoprolol 50 mg 1 p.o. b.i.d. 5. Nitroglycerin 0.4 mg sublingual p.r.n. q.5 minutes x3 for chest     pain. 6. Humalog 75/25 (insulin lispro protamine) 25 to 80 units subcu, 80     units q.a.m. and 35 units at bedtime. 7. Norvasc 10 mg 1 p.o. daily.     [name] Plum, MD     CN/MEDQ  D:  10/04/2010  T:  10/04/2010  Job:  [id number]  cc:   Nolene Ebbs, M.D. Fax: [phone]  Electronically Signed by [name] Plum  on 10/04/2010 06:28:31 PM

## 2010-10-05 ENCOUNTER — Emergency Department (HOSPITAL_COMMUNITY): Payer: Medicare Other

## 2010-10-05 ENCOUNTER — Emergency Department (HOSPITAL_COMMUNITY)
Admission: EM | Admit: 2010-10-05 | Discharge: 2010-10-05 | Disposition: A | Discharge status: Home / Self Care | Payer: Medicare Other | Attending: Emergency Medicine | Admitting: Emergency Medicine

## 2010-10-05 DIAGNOSIS — I252 Old myocardial infarction: Secondary | ICD-10-CM | POA: Insufficient documentation

## 2010-10-05 DIAGNOSIS — Z794 Long term (current) use of insulin: Secondary | ICD-10-CM | POA: Insufficient documentation

## 2010-10-05 DIAGNOSIS — I509 Heart failure, unspecified: Secondary | ICD-10-CM | POA: Insufficient documentation

## 2010-10-05 DIAGNOSIS — Z9010 Acquired absence of unspecified breast and nipple: Secondary | ICD-10-CM | POA: Insufficient documentation

## 2010-10-05 DIAGNOSIS — I10 Essential (primary) hypertension: Secondary | ICD-10-CM | POA: Insufficient documentation

## 2010-10-05 DIAGNOSIS — I2510 Atherosclerotic heart disease of native coronary artery without angina pectoris: Secondary | ICD-10-CM | POA: Insufficient documentation

## 2010-10-05 DIAGNOSIS — N289 Disorder of kidney and ureter, unspecified: Secondary | ICD-10-CM | POA: Insufficient documentation

## 2010-10-05 DIAGNOSIS — E119 Type 2 diabetes mellitus without complications: Secondary | ICD-10-CM | POA: Insufficient documentation

## 2010-10-05 DIAGNOSIS — Z853 Personal history of malignant neoplasm of breast: Secondary | ICD-10-CM | POA: Insufficient documentation

## 2010-10-05 DIAGNOSIS — R0602 Shortness of breath: Secondary | ICD-10-CM | POA: Insufficient documentation

## 2010-10-05 LAB — BASIC METABOLIC PANEL
BUN: 41 mg/dL — ABNORMAL HIGH (ref 6–23)
Chloride: 109 mEq/L (ref 96–112)
GFR calc non Af Amer: 19 mL/min — ABNORMAL LOW (ref >60)
Glucose, Bld: 66 mg/dL — ABNORMAL LOW (ref 70–99)
Potassium: 4.9 mEq/L (ref 3.5–5.1)
Sodium: 139 mEq/L (ref 135–145)

## 2010-10-05 LAB — CBC
HCT: 28.3 % — ABNORMAL LOW (ref 36.0–46.0)
MCV: 72.4 fL — ABNORMAL LOW (ref 78.0–100.0)
Platelets: 264 10*3/uL (ref 150–400)
RBC: 3.91 MIL/uL (ref 3.87–5.11)
WBC: 10.6 10*3/uL — ABNORMAL HIGH (ref 4.0–10.5)

## 2010-10-05 LAB — DIFFERENTIAL
Basophils Absolute: 0.1 10*3/uL (ref 0.0–0.1)
Lymphocytes Relative: 21 % (ref 12–46)
Lymphs Abs: 2.2 10*3/uL (ref 0.7–4.0)
Neutrophils Relative %: 63 % (ref 43–77)

## 2010-10-06 ENCOUNTER — Emergency Department (HOSPITAL_COMMUNITY): Payer: Medicare Other

## 2010-10-06 ENCOUNTER — Inpatient Hospital Stay (HOSPITAL_COMMUNITY)
Admission: EM | Admit: 2010-10-06 | Discharge: 2010-10-09 | DRG: 292 | Disposition: A | Discharge status: Home / Self Care | Payer: Medicare Other | Attending: Internal Medicine | Admitting: Internal Medicine

## 2010-10-06 ENCOUNTER — Telehealth: Payer: Self-pay | Admitting: Cardiovascular Disease

## 2010-10-06 DIAGNOSIS — T82897A Other specified complication of cardiac prosthetic devices, implants and grafts, initial encounter: Secondary | ICD-10-CM | POA: Diagnosis present

## 2010-10-06 DIAGNOSIS — N183 Chronic kidney disease, stage 3 unspecified: Secondary | ICD-10-CM | POA: Diagnosis present

## 2010-10-06 DIAGNOSIS — N179 Acute kidney failure, unspecified: Secondary | ICD-10-CM | POA: Diagnosis present

## 2010-10-06 DIAGNOSIS — Y9210 Unspecified residential institution as the place of occurrence of the external cause: Secondary | ICD-10-CM | POA: Diagnosis present

## 2010-10-06 DIAGNOSIS — I252 Old myocardial infarction: Secondary | ICD-10-CM

## 2010-10-06 DIAGNOSIS — Z7982 Long term (current) use of aspirin: Secondary | ICD-10-CM

## 2010-10-06 DIAGNOSIS — E1169 Type 2 diabetes mellitus with other specified complication: Secondary | ICD-10-CM | POA: Diagnosis not present

## 2010-10-06 DIAGNOSIS — N281 Cyst of kidney, acquired: Secondary | ICD-10-CM | POA: Diagnosis present

## 2010-10-06 DIAGNOSIS — I2510 Atherosclerotic heart disease of native coronary artery without angina pectoris: Secondary | ICD-10-CM | POA: Diagnosis present

## 2010-10-06 DIAGNOSIS — Z9861 Coronary angioplasty status: Secondary | ICD-10-CM

## 2010-10-06 DIAGNOSIS — T50995A Adverse effect of other drugs, medicaments and biological substances, initial encounter: Secondary | ICD-10-CM | POA: Diagnosis present

## 2010-10-06 DIAGNOSIS — Y831 Surgical operation with implant of artificial internal device as the cause of abnormal reaction of the patient, or of later complication, without mention of misadventure at the time of the procedure: Secondary | ICD-10-CM | POA: Diagnosis present

## 2010-10-06 DIAGNOSIS — Z794 Long term (current) use of insulin: Secondary | ICD-10-CM

## 2010-10-06 DIAGNOSIS — Z853 Personal history of malignant neoplasm of breast: Secondary | ICD-10-CM

## 2010-10-06 DIAGNOSIS — I5023 Acute on chronic systolic (congestive) heart failure: Principal | ICD-10-CM | POA: Diagnosis present

## 2010-10-06 DIAGNOSIS — I509 Heart failure, unspecified: Secondary | ICD-10-CM | POA: Diagnosis present

## 2010-10-06 DIAGNOSIS — E669 Obesity, unspecified: Secondary | ICD-10-CM | POA: Diagnosis present

## 2010-10-06 DIAGNOSIS — I129 Hypertensive chronic kidney disease with stage 1 through stage 4 chronic kidney disease, or unspecified chronic kidney disease: Secondary | ICD-10-CM | POA: Diagnosis present

## 2010-10-06 LAB — CULTURE, BLOOD (ROUTINE X 2)
Culture  Setup Time: 201205172325
Culture: NO GROWTH
Culture: NO GROWTH

## 2010-10-06 LAB — CBC
HCT: 28.3 % — ABNORMAL LOW (ref 36.0–46.0)
Hemoglobin: 9.4 g/dL — ABNORMAL LOW (ref 12.0–15.0)
MCV: 72.9 fL — ABNORMAL LOW (ref 78.0–100.0)
RDW: 15.5 % (ref 11.5–15.5)
WBC: 11.2 10*3/uL — ABNORMAL HIGH (ref 4.0–10.5)

## 2010-10-06 LAB — TROPONIN I: Troponin I: <0.3 ng/mL (ref <0.30)

## 2010-10-06 LAB — BASIC METABOLIC PANEL
BUN: 39 mg/dL — ABNORMAL HIGH (ref 6–23)
CO2: 21 mEq/L (ref 19–32)
GFR calc non Af Amer: 18 mL/min — ABNORMAL LOW (ref >60)
Glucose, Bld: 109 mg/dL — ABNORMAL HIGH (ref 70–99)
Potassium: 4.7 mEq/L (ref 3.5–5.1)
Sodium: 137 mEq/L (ref 135–145)

## 2010-10-06 LAB — CARDIAC PANEL(CRET KIN+CKTOT+MB+TROPI)
CK, MB: 5 ng/mL — ABNORMAL HIGH (ref 0.3–4.0)
Total CK: 304 U/L — ABNORMAL HIGH (ref 7–177)
Troponin I: <0.3 ng/mL (ref <0.30)

## 2010-10-06 LAB — PRO B NATRIURETIC PEPTIDE: Pro B Natriuretic peptide (BNP): 9256 pg/mL — ABNORMAL HIGH (ref 0–125)

## 2010-10-06 LAB — CK TOTAL AND CKMB (NOT AT ARMC): Relative Index: 1.6 (ref 0.0–2.5)

## 2010-10-06 NOTE — Telephone Encounter (Signed)
Pt currently heading into er, not been seen here for 3 yrs. Has consult visit set. Told to call tomorrow if needs to be seen sooner after er appointment, pt agreed to do so.Corwin Levins RN

## 2010-10-06 NOTE — Telephone Encounter (Signed)
Patient just got out of hospital since Friday - Monday - Tuesday legs swelling and she went back to ER and they suggested that she call us to set up appointment. I set her up for June 25-

## 2010-10-07 ENCOUNTER — Telehealth: Payer: Self-pay | Admitting: *Deleted

## 2010-10-07 DIAGNOSIS — I509 Heart failure, unspecified: Secondary | ICD-10-CM

## 2010-10-07 LAB — GLUCOSE, CAPILLARY
Glucose-Capillary: 121 mg/dL — ABNORMAL HIGH (ref 70–99)
Glucose-Capillary: 124 mg/dL — ABNORMAL HIGH (ref 70–99)

## 2010-10-07 NOTE — Telephone Encounter (Signed)
Pt in  hospital just fyi, stated she had" fluid in chest". Assured her we will fit in w dr Lauro Regulus or NP when gets out.Corwin Levins RN

## 2010-10-08 DIAGNOSIS — R609 Edema, unspecified: Secondary | ICD-10-CM

## 2010-10-08 LAB — IRON AND TIBC
Iron: 49 ug/dL (ref 42–135)
UIBC: 196 ug/dL

## 2010-10-08 LAB — BASIC METABOLIC PANEL
BUN: 45 mg/dL — ABNORMAL HIGH (ref 6–23)
Chloride: 106 mEq/L (ref 96–112)
Creatinine, Ser: 2.64 mg/dL — ABNORMAL HIGH (ref 0.4–1.2)
Glucose, Bld: 107 mg/dL — ABNORMAL HIGH (ref 70–99)
Potassium: 4.6 mEq/L (ref 3.5–5.1)

## 2010-10-08 LAB — GLUCOSE, CAPILLARY
Glucose-Capillary: 130 mg/dL — ABNORMAL HIGH (ref 70–99)
Glucose-Capillary: 140 mg/dL — ABNORMAL HIGH (ref 70–99)

## 2010-10-08 LAB — CBC
HCT: 27.3 % — ABNORMAL LOW (ref 36.0–46.0)
MCH: 24 pg — ABNORMAL LOW (ref 26.0–34.0)
MCV: 72.8 fL — ABNORMAL LOW (ref 78.0–100.0)
Platelets: 248 10*3/uL (ref 150–400)
RBC: 3.75 MIL/uL — ABNORMAL LOW (ref 3.87–5.11)
WBC: 9.1 10*3/uL (ref 4.0–10.5)

## 2010-10-08 LAB — URINE MICROSCOPIC-ADD ON

## 2010-10-08 LAB — URINALYSIS, ROUTINE W REFLEX MICROSCOPIC
Bilirubin Urine: NEGATIVE
Glucose, UA: NEGATIVE mg/dL
Hgb urine dipstick: NEGATIVE
Specific Gravity, Urine: 1.014 (ref 1.005–1.030)
pH: 5 (ref 5.0–8.0)

## 2010-10-09 ENCOUNTER — Inpatient Hospital Stay (HOSPITAL_COMMUNITY): Payer: Medicare Other

## 2010-10-09 LAB — GLUCOSE, CAPILLARY

## 2010-10-09 LAB — CBC
HCT: 27 % — ABNORMAL LOW (ref 36.0–46.0)
Hemoglobin: 8.9 g/dL — ABNORMAL LOW (ref 12.0–15.0)
MCV: 72.6 fL — ABNORMAL LOW (ref 78.0–100.0)
RBC: 3.72 MIL/uL — ABNORMAL LOW (ref 3.87–5.11)
RDW: 15.4 % (ref 11.5–15.5)
WBC: 9.4 10*3/uL (ref 4.0–10.5)

## 2010-10-09 LAB — BASIC METABOLIC PANEL
Chloride: 105 mEq/L (ref 96–112)
GFR calc non Af Amer: 19 mL/min — ABNORMAL LOW (ref >60)
Glucose, Bld: 92 mg/dL (ref 70–99)
Potassium: 4.9 mEq/L (ref 3.5–5.1)
Sodium: 138 mEq/L (ref 135–145)

## 2010-10-09 LAB — URINE CULTURE: Culture  Setup Time: 201205251823

## 2010-10-12 LAB — CBC
MCH: 23.6 pg — ABNORMAL LOW (ref 26.0–34.0)
MCHC: 32.2 g/dL (ref 30.0–36.0)
MCV: 73.2 fL — ABNORMAL LOW (ref 78.0–100.0)
Platelets: 287 10*3/uL (ref 150–400)
RDW: 15.5 % (ref 11.5–15.5)

## 2010-10-12 LAB — COMPREHENSIVE METABOLIC PANEL
ALT: 16 U/L (ref 0–35)
Alkaline Phosphatase: 58 U/L (ref 39–117)
BUN: 45 mg/dL — ABNORMAL HIGH (ref 6–23)
Chloride: 106 mEq/L (ref 96–112)
Glucose, Bld: 111 mg/dL — ABNORMAL HIGH (ref 70–99)
Potassium: 4.9 mEq/L (ref 3.5–5.1)
Sodium: 138 mEq/L (ref 135–145)
Total Bilirubin: 0.2 mg/dL — ABNORMAL LOW (ref 0.3–1.2)
Total Protein: 7 g/dL (ref 6.0–8.3)

## 2010-10-12 LAB — CARDIAC PANEL(CRET KIN+CKTOT+MB+TROPI)
Relative Index: 1.5 (ref 0.0–2.5)
Troponin I: <0.3 ng/mL (ref <0.30)

## 2010-10-12 NOTE — Consult Note (Signed)
NAME:  Alexis [name], Alexis [name] NO.:  [id number]  MEDICAL RECORD NO.:  [id number]           PATIENT TYPE:  I  LOCATION:  [id number]                         FACILITY:  [facility]  PHYSICIAN:  Sherril Croon, M.D.   DATE OF BIRTH:  11/05/1945  DATE OF CONSULTATION:  10/08/2010 DATE OF DISCHARGE:                                CONSULTATION   This is a lady with acute-on-chronic renal failure.  She was admitted on Oct 01, 2010, for shortness of breath and underwent CT scan with IV contrast.  She has baseline renal insufficiency with creatinine of 1.82 at baseline, CKD stage III, history of diabetes, hypertension, obesity, came in with diagnosis of congestive heart failure.  She was also noted to be anemic and she is a 12-year survivor from breast cancer.  She has a history of PTA right coronary artery in the past.  Weight since hospitalization has gone down to 116 pounds.  The creatinine is improved from creatinine of 3 down to creatinine of 2.6.PAST MEDICAL HISTORY: 1. Hypertension. 2. Diabetes. 3. Coronary artery disease. 4. History of CKD stage III. 5. History of breast cancer diagnosed in 2000. 6. History of obesity. 7. History of congestive heart failure. 8. History of anemia. 9. History of percutaneous angioplasty right coronary artery.  MEDICATIONS: 1. Aspirin 325 mg daily. 2. Lasix 40 mg daily. 3. BiDil one tablet p.o. daily. 4. Lopressor 50 mg daily. 5. Protonix 40 mg a day.  ALLERGIES:  LISINOPRIL.  SOCIAL HISTORY:  No tobacco.  No alcohol.  FAMILY HISTORY:  Positive for coronary artery disease.  Positive for breast cancer.  REVIEW OF SYSTEMS:  GENERAL:  Denies fatigue, fever, or chills.  EYES: No visual complaint.  No blurred vision or double vision.Marland Kitchen  EARS, NOSE, MOUTH, and THROAT:  No hearing loss, epistaxis, or sore throat. CARDIOVASCULAR:  No angina.  No chest pain.  No shortness of breath or lower extremity edema or dyspnea on lying flat.  RESPIRATORY:   No cough, wheeze, or hemoptysis.  ABDOMINAL SYSTEM:  No nausea, vomiting, or diarrhea.  UROGENITAL:  No urgency, frequency or dysuria, .  ENDOCRINE: No history of diabetes, thyroid, or adrenal disease. HEMATOLOGIC/ONCOLOGIC:  History of breast cancer.  MUSCULOSKELETAL: Denies use of nonsteroidal anti-inflammatories or COX-2 inhibitors.  PHYSICAL EXAMINATION:  An alert, pleasant lady with  blood pressure 120/80, pulse 70 and afebrile. In is 1000 mL, out is 1.5 L. HEAD AND EYES:  Normocephalic and atraumatic.  Mild pallor.  No icterus. Extraocular movements were intact. EARS, NOSE, MOUTH, AND THROAT:  Tympanic membranes are normal.  Nasal mucosa was clear.  Oropharynx clear. NECK:  Supple.  No thyromegaly or adenopathy.  JVP appeared flat. CARDIOVASCULAR:  Regular rate and rhythm.  No murmurs, rubs, or gallops. RESPIRATORY:  Lung fields are clear to auscultation.  No wheezes.  No rales. ABDOMEN:  Soft, nontender.  Bowel sounds present. EXTREMITIES:  Trace plus  pitting edema.  Most recent labs, sodium 138, potassium 4.6, chloride 106, CO2 is 28, BUN 45, creatinine 2.6, glucose 107, calcium 8.5, hemoglobin 9.0, WBC 9.1.  Ultrasound of the kidney revealed a  2.7 cm mass.  ASSESSMENT AND PLAN: 1. Acute-on-chronic renal failure most likely secondary to contrast.     Creatinine which is improving.  This could have lead to some mild     volume overload.  She states she had dietary indiscretion with high     sodium fluids.  She denies use of nonsteroidal anti-inflammatory or     COX-2 inhibitors that could have precipitated the acute-on-chronic     renal failure.  No history of hypertensive urgency. 2. Hypertension.  Volume appears to be stable.  Continue IV diuresis     at this present time. 3. Renal mass with contrast studies that are contraindicated.  We will     recheck ultrasound study  and consider a Urology consult. 4. Anemia.  We will check iron stores and give p.o. iron.  She has  a     history of breast cancer so caution needs to be exercised when     giving the patient Epogen.     Sherril Croon, M.D.     MWW/MEDQ  D:  10/08/2010  T:  10/09/2010  Job:  [id number]  Electronically Signed by Edrick Oh M.D. on 10/12/2010 11:33:41 AM

## 2010-10-13 ENCOUNTER — Encounter: Payer: Self-pay | Admitting: Cardiovascular Disease

## 2010-10-13 NOTE — H&P (Signed)
NAME:  Alexis [name], Alexis [name] NO.:  [id number]  MEDICAL RECORD NO.:  [id number]           PATIENT TYPE:  E  LOCATION:  WLED                         FACILITY:  [facility]  PHYSICIAN:  Eleonore Chiquito, MD         DATE OF BIRTH:  1945-07-04  DATE OF ADMISSION:  10/06/2010 DATE OF DISCHARGE:                             HISTORY & PHYSICAL   CHIEF COMPLAINT:  Shortness of breath.  HISTORY OF PRESENT ILLNESS:  This is a 65 year old female with a history of CAD status post cardiac cath and stent 10 years ago as well as history of breast cancer.  The patient was just discharged from the hospital on Sep 30, 2010 after admitted for CHF exacerbation.  Because of the underlying chronic kidney disease, the patient was not discharged on Lasix and the patient has not come back with worsening of shortness of breath.  She denies any chest pain.  No nausea, vomiting, or diarrhea.  No dysuria, urgency, frequency of urination.  PAST MEDICAL HISTORY:  Significant for, 1. CAD. 2. History of prior MI. 3. Hypertension. 4. History of breast cancer. 5. Diabetes mellitus. 6. Chronic kidney disease. 7. Obesity.  PAST SURGICAL HISTORY: 1. Low back surgery. 2. Hysterectomy. 3. Tubal ligation. 4. Breast carcinoma status post left mastectomy. 5. Left arm fracture status post plate and screw. 6. Right tibia fracture status post open reduction and internal     fixation.  MEDICATIONS:  Current medications include, 1. Aspirin 325 one p.o. daily. 2. Ferrous sulfate 325 mg p.o. t.i.d. with meals. 3. BiDil 20/37.5 1 p.o. t.i.d. 4. Metoprolol 50 mg p.o. b.i.d. 5. Nitroglycerin  0.4 mg sublingual q.5 minutes x3 for chest pain. 6. Humalog 75/25 80 units q.a.m. at 25 units q.p.m. 7. Norvasc 10 mg p.o. daily.  REVIEW OF SYSTEMS:  HEENT:  There is no headache, no blurred vision. NECK:  No history of thyroid problems.  CHEST:  Positive shortness of breath.  HEART:  As in the HPI.  GENITOURINARY:  As in the  HPI. NEUROLOGICAL:  No history of stroke or TIA in the past.  GI:  As in HPI.  PHYSICAL EXAMINATION:  VITAL SIGNS:  The patient blood pressure is 132/61, temperature 98.5, pulse 72, respirations 16. HEENT:  Head is atraumatic, normocephalic.  Eyes:  Extraocular movements are intact.  Oral mucosa is pink and moist. NECK:  Supple. CHEST:  Bilateral crackles auscultated. HEART: S1 and S2.  Regular rate and rhythm. ABDOMEN:  Soft and nontender.  No organomegaly. EXTREMITIES:  There is 2+ edema bilaterally noted. NEUROLOGICAL:  Cranial II through XII grossly intact.  Motor strength is 5/5 in both upper and lower extremities.  DATA:  Pertinent imaging studies today, chest x-ray shows cardiomegaly and interstitial prominence, pulmonary venous congestion.  Pertinent labs show WBC 11.2, hemoglobin of 9.4, hematocrit 28.3, platelet count of 268.  Sodium 137, potassium 4.7, chloride 106, CO2 of 21, BUN 39, creatinine 2.67, glucose is 109.  CK is 286, CK-MB 4.6, troponin-I less than 0.30.  BNP is 9256.  ASSESSMENT: 1. Acute systolic heart failure. 2. History of coronary artery disease.  3. Hypertension. 4. Chronic kidney disease stage III.  PLAN: 1. Acute systolic heart failure.  The patient will be started on Lasix     40 mg IV 12 hours.  We are going to obtain another BNP in the     morning and I am going to put the patient on Foley catheter and     measure the I's and O's.  The patient's EKG was compared with the     previous and there has been no change in the EKG.  I am going to     obtain three sets of cardiac enzymes.  At this time, the patient's     blood pressure is stable so she will be going to the telemetry     floor. 2. Hypertension.  We will continue the patient on metoprolol and     BiDil.  I am going to hold amlodipine because of lower extremity     edema at this time. 3. Diabetes mellitus.  The patient will be put on sliding scale of     insulin along with Humalog. 4.  CAD, stage III.  The patient does have underlying CKD and because     the patient has been started on Lasix, it has been closely     monitored though the patient will need to be discharged on Lasix     this time despite the fact that she has CKD because of the     underlying congestive heart failure. 5. DVT prophylaxis is going to be with Lovenox.     Eleonore Chiquito, MD     GL/MEDQ  D:  10/06/2010  T:  10/06/2010  Job:  [id number]  Electronically Signed by Eleonore Chiquito  on 10/13/2010 04:15:08 PM

## 2010-10-13 NOTE — Discharge Summary (Signed)
NAME:  Alexis [name], Alexis [name]                ACCOUNT NO.:  [id number]  MEDICAL RECORD NO.:  [id number]           PATIENT TYPE:  I  LOCATION:  [id number]                         FACILITY:  [facility]  PHYSICIAN:  Estill Cotta, MD       DATE OF BIRTH:  01-08-1946  DATE OF ADMISSION:  10/06/2010 DATE OF DISCHARGE:  10/09/2010                        DISCHARGE SUMMARY - REFERRING   DISCHARGE DIAGNOSES: 1. Acute-on-chronic systolic congestive heart failure exacerbation. 2. Hypertension. 3. Acute-on-chronic kidney disease stage 3. 4. Diabetes mellitus. 5. Coronary artery disease.  CONSULTATIONS: 1. Cardiology, Dr. Mertie Moores. 2. Renal, Dr. Edrick Oh.  DISCHARGE MEDICATIONS: 1. Tussionex 5 mL p.o. q.12 h. for 10 days. 2. Ferrous sulfate 325 mg p.o. daily. 3. Norvasc 10 mg p.o. daily. 4. Humalog 75/25, 60 units in the morning and 20 units at night. 5. BiDil 20/37.5 1 tablet p.o. t.i.d. 6. Metoprolol 50 mg p.o. b.i.d. 7. Nitroglycerin sublingual 0.4 mg p.r.n. for chest pain. 8. Aspirin 325 mg daily. 9. Lasix 40 mg p.o. b.i.d.  BRIEF HISTORY OF PRESENT ILLNESS:  At the time of admission, Alexis [name] is a 65 year old female with history of coronary artery disease status post cath and stent 10 years ago, was recently discharged on May 21st because of the underlying chronic kidney disease the patient was not discharged on Lasix and she returned with worsening of shortness of breath and peripheral edema.  RADIOLOGICAL DATA:  Chest x-ray two-view on May 23rd cardiomegaly and interstitial prominence likely representing pulmonary venous congestion, congestive heart failure, small bilateral pleural effusion and renal ultrasound on May 26th, lesion seen on the prior ultrasound in the midportion of the right kidney demonstrated to be a benign appearing cyst on the current exam.  No further followup is recommended and otherwise normal renal ultrasound.  PERTINENT LAB DIAGNOSTIC DATA:  BNP at the time of  admission was 9256. Cardiac enzymes remained negative.  BRIEF HOSPITALIZATION COURSE: 1. Acute-on-chronic systolic congestive heart failure exacerbation.     The patient was admitted to the medicine service.  She already had     an echocardiogram on Oct 01, 2010, which had shown EF of 45% to 50%     with grade 2 diastolic dysfunction.  She was placed on IV diuresis     and on low sodium diet.  The patient was continued on aspirin, beta     blocker, and BiDil.  The patient also reported that she had not     tolerated ACE inhibitors secondary to cough.  At the time of     discharge, she was tolerating p.o. Lasix well and the renal     function remained stable. 2. Acute-on-chronic renal failure with chronic kidney disease stage 3     most likely secondary to the contrast media the patient received     during the previous admission and at this time creatinine is     improving.  Renal ultrasound was obtained on May 26th which showed     that a prior lesion seen on the ultrasound in January 2012 is     demonstrated to be a benign appearing cyst on  the current exam.     The patient will follow up with Dr. Dierdre Harness who was also consulted     during this hospitalization in next 1 to 2 weeks.  She was also     given a dose of Epogen by the renal service.    Discharge followup     with Dr. Acie Fredrickson in next 2 weeks and Dr. Edrick Oh in next 2     weeks.  DISCHARGE TIME:  35 minutes.     Estill Cotta, MD     RR/MEDQ  D:  10/10/2010  T:  10/10/2010  Job:  [id number]  cc:   Thayer Headings, M.D. Fax: [phone]  Sherril Croon, M.D. Fax: [phone]  Electronically Signed by Inioluwa Baris  on 10/13/2010 10:39:20 AM

## 2010-10-14 ENCOUNTER — Ambulatory Visit (INDEPENDENT_AMBULATORY_CARE_PROVIDER_SITE_OTHER): Payer: Medicare Other | Admitting: Cardiovascular Disease

## 2010-10-14 ENCOUNTER — Encounter: Payer: Self-pay | Admitting: Cardiovascular Disease

## 2010-10-14 VITALS — BP 142/60 | HR 80 | Ht 67.0 in | Wt 255.8 lb

## 2010-10-14 DIAGNOSIS — D631 Anemia in chronic kidney disease: Secondary | ICD-10-CM | POA: Insufficient documentation

## 2010-10-14 DIAGNOSIS — N189 Chronic kidney disease, unspecified: Secondary | ICD-10-CM

## 2010-10-14 DIAGNOSIS — I509 Heart failure, unspecified: Secondary | ICD-10-CM

## 2010-10-14 DIAGNOSIS — E669 Obesity, unspecified: Secondary | ICD-10-CM

## 2010-10-14 NOTE — Assessment & Plan Note (Addendum)
Alexis [name] seems to be doing fairly well from a cardiac standpoint. I suspect that her recent hospitalization for shortness of breath was do to her chronic renal insufficiency as well as her systolic and diastolic congestive heart failure. Her ejection fraction is 45-50%. She has done very well on the Lasix. We'll continue with her same medications. I'll see her again in 3 months.

## 2010-10-14 NOTE — Progress Notes (Signed)
Loney Laurence Date of Birth  12-02-1945 [facility] [id number] [address].     [place], [place]  [address] [phone]  Fax  [phone]  History of Present Illness:  Kerissa is a middle age female with a long history of HTN and DM.  She was recently seen in the hospital for dypsnea.  Her Lasix had been continued due to renal insufficiency.  Echo revealed an EF of 45-50%.  She is doing quite a bit better on Lasix.  She had labs drawn yesterday at her medical doctors.   Current Outpatient Prescriptions on File Prior to Visit  Medication Sig Dispense Refill  . amLODipine (NORVASC) 5 MG tablet Take 10 mg by mouth daily.       Marland Kitchen aspirin 325 MG tablet Take 325 mg by mouth daily.        . ferrous gluconate (FERGON) 325 MG tablet Take 325 mg by mouth daily with breakfast.        . furosemide (LASIX) 40 MG tablet Take 40 mg by mouth daily.       . insulin lispro protamine-insulin lispro (HUMALOG 75/25) (75-25) 100 UNIT/ML SUSP Inject 60 Units into the skin daily with breakfast. 20 UNITS AT NIGHT       . metoprolol (LOPRESSOR) 50 MG tablet Take 50 mg by mouth 2 (two) times daily.        . nitroGLYCERIN (NITROSTAT) 0.4 MG SL tablet Place 0.4 mg under the tongue every 5 (five) minutes as needed.        Marland Kitchen olmesartan (BENICAR) 40 MG tablet Take 20 mg by mouth daily.       Marland Kitchen DISCONTD: carvedilol (COREG) 12.5 MG tablet Take 12.5 mg by mouth 2 (two) times daily with a meal.        . DISCONTD: hydrochlorothiazide 25 MG tablet Take 25 mg by mouth daily.        Marland Kitchen DISCONTD: isosorbide-hydrALAZINE (BIDIL) 20-37.5 MG per tablet Take 1 tablet by mouth 3 (three) times daily.        Marland Kitchen DISCONTD: metFORMIN (GLUCOPHAGE) 500 MG tablet Take 500 mg by mouth 2 (two) times daily with a meal.          No Known Allergies  Past Medical History  Diagnosis Date  . Diabetes mellitus   . Hypertension   . Morbid obesity   . Coronary artery disease   . Chest pain   . 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 Hyperlipidemia   . Hx of breast cancer   . Chronic kidney disease   . SOB (shortness of breath)   . CHF (congestive heart failure)     Past Surgical History  Procedure Date  . Mastectomy   . Back surgery   . Tubal ligation   . Transthoracic echocardiogram 10/01/2010     Left ventricle: The cavity size was mildly dilated. Wall thickness was increased in a pattern of mild LVH. Systolic function was   mildly reduced. The estimated ejection fraction was in the range  of 45% to 50%.   . Coronary angioplasty 12/19/2003    inferior wall hypokinesis. ef 50%    History  Smoking status  . Former Smoker  . Quit date: 05/16/2002  Smokeless tobacco  . Not on file    History  Alcohol Use No    Family History  Problem Relation Age of Onset  . Breast cancer Mother     Reviw of Systems:  Reviewed in the HPI.  All other systems are negative.  Physical Exam: BP 142/60  Pulse 80  Ht 5\' 7"  (1.702 m)  Wt 255 lb 12.8 oz (116.03 kg)  BMI 40.06 kg/m2 The patient is alert and oriented x 3.  The mood and affect are normal.  The skin is warm and dry.  Color is normal.  The HEENT exam reveals that the sclera are nonicteric.  The mucous membranes are moist.  The carotids are 2+ without bruits.  There is no thyromegaly.  There is no JVD.  The lungs are clear.  The chest wall is non tender.  The heart exam reveals a regular rate with a normal S1 and S2.  There are no murmurs, gallops, or rubs.  The PMI is not displaced.   Abdominal exam reveals good bowel sounds.  There is no guarding or rebound.  There is no hepatosplenomegaly or tenderness.  There are no masses.  Exam of the legs reveal no clubbing, cyanosis, or edema.  The legs are without rashes.  The distal pulses are intact.  Cranial nerves II - XII are intact.  Motor and sensory functions are intact.  The gait is normal.  Assessment / Plan:

## 2010-10-15 NOTE — Consult Note (Signed)
NAME:  Alexis [name], UPDEGRAFF NO.:  [id number]  MEDICAL RECORD NO.:  [id number]           PATIENT TYPE:  I  LOCATION:  [id number]                         FACILITY:  [facility]  PHYSICIAN:  Thayer Headings, M.D. DATE OF BIRTH:  1946/01/21  DATE OF CONSULTATION:  10/07/2010 DATE OF DISCHARGE:                                CONSULTATION   HISTORY OF PRESENT ILLNESS:  Alexis [name] is a 65 year old female who I have seen in the remote past.  She has a history of coronary artery disease, systolic and diastolic congestive heart failure, diabetes mellitus, hypertension, breast cancer and obesity.  She also has stage 3 chronic kidney disease.  We are asked to see the patient for repeat hospitalization for congestive heart failure.  Alexis [name] is a 65 year old female who I saw approximately 7 years ago for chest pain.  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  She had a stent previously placed in the right coronary artery, but now that stent is occluded.  She did quite well from a cardiac standpoint and did not have any further symptoms.  I saw her again in 2009.  I have not seen her since that time.  The patient was admitted to the hospital recently with significant shortness of breath.  She was found to have mild left ventricular dysfunction with an ejection fraction of 45-50%.  She had a CTA of the lungs, which was negative for pulmonary embolus.  She was diuresed, but her creatinine went up significantly.  It is not clear whether the creatinine increased due to the diuresis or perhaps due to the contrast from the CT angiogram.  The patient was discharged from the hospital on Oct 04, 2010.  She was readmitted on Oct 06, 2010, with profound shortness of breath as well as significant leg edema.  The patient has been restarted on her Lasix since that time and feels quite a bit  better.  CURRENT MEDICATIONS:  Include nitroglycerin as needed, metoprolol 50 mg twice a day, isosorbide dinitrate/hydralazine 20 mg/37.5 mg 3 times a day, iron sulfate once a day, aspirin once a day, Lasix 40 mg a day, Humalog mix 75/25 25 to 80 units a day, amlodipine 10 mg a day.  ALLERGIES:  She has no known drug allergies.  She is intolerant to LISINOPRIL, which causes a cough.  PAST MEDICAL HISTORY.: 1. History of coronary artery disease - status post PTCA and stenting     of right coronary artery.  This stent is now known to be closed.     She has good collateral filling of right coronary artery.  She has     not had any angina. 2. Congestive heart failure.  She has a mixture of systolic and     diastolic congestive heart failure.  She has been somewhat     noncompliant.  She eats at [employer] on a regular     basis. 3. Chronic kidney disease - secondary to her diabetes mellitus and  poorly controlled hypertension. 4. Obesity. 5. History of anemia.  SOCIAL HISTORY:  The patient does not smoke.  She does not drink alcohol. FAMILY HISTORY:  Positive for breast cancer and coronary artery disease as well stroke.  REVIEW OF SYSTEMS:  As noted in the HPI.  All other systems are negative.  PHYSICAL EXAMINATION:  GENERAL:  On exam, she is a middle-aged female, in no acute distress.  She is alert and oriented x3.  Her mood and affect are normal. VITAL SIGNS:  Her current vital signs reveal a temperature of 97.6, heart rate 76, blood pressure is 165/76.  Her O2 saturation is 97% on room air. HEENT EXAM:  Reveals 2+ carotids.  She has no bruits, no JVD, no thyromegaly. NECK:  Supple.  Her sclerae are nonicteric. LUNGS:  Her lungs are clear. HEART:  Regular rate.  S1, S2.  She has a soft systolic murmur.  Her PMI is nondisplaced. ABDOMEN:  Abdominal exam is obese, but otherwise benign.  There is no hepatosplenomegaly. EXTREMITIES:  She has no clubbing,  cyanosis, or edema. NEURO EXAM:  Her neuro exam reveals 1+ pitting edema in the legs with the right being greater than the left.  LABORATORY DATA:  Her laboratory data reveals negative cardiac enzymes. Her sodium is 138, potassium is 4.9, chloride is 106, CO2 is 21, glucose is 111, BUN is 45, creatinine is 2.62.  Her GFR is estimated at 22.  Her BNP is 9292.  Her EKG reveals normal sinus rhythm.  She has an incomplete left bundle- branch block.  She has a nonspecific ST-T wave change.  She has a previous inferior wall myocardial infarction.  IMPRESSION AND PLAN:  Dyspnea.  The patient's dyspnea is multifactorial. She has a history of obesity.  She has mildly depressed left ventricular systolic function, but also has some diastolic dysfunction.  She also has significant chronic kidney disease.  At this point, I think we need to restart the Lasix.  We clearly are in a difficult spot as any degree of diuresis could worsen her renal failure.  On the other hand, if we do not give her any Lasix, she will develop congestive heart failure rather quickly.  I thought that we can continue the Lasix and watch her renal function very closely.  We may need to obtain a nephrology consult as she may need to have dialysis at some point.  I suspect her diabetes and hypertension have taken a great toll on her kidneys.  She is not having any angina and cardiac enzymes are negative.  I do not think that we may have much to offer her past starting her on the diuretics.  Her blood pressure is mildly elevated.  We will consider adding an ARB. She has intolerance to lisinopril.  Thank you very much.  We will continue to follow along with you.     Thayer Headings, M.D.     PJN/MEDQ  D:  10/07/2010  T:  10/07/2010  Job:  [id number]  cc:   Nolene Ebbs, M.D. [phone]  Electronically Signed by Mertie Moores M.D. on 10/15/2010 04:48:58 PM

## 2010-11-08 ENCOUNTER — Institutional Professional Consult (permissible substitution): Payer: Medicare Other | Admitting: Cardiovascular Disease

## 2011-01-13 ENCOUNTER — Encounter: Payer: Self-pay | Admitting: Cardiovascular Disease

## 2011-01-13 ENCOUNTER — Ambulatory Visit (INDEPENDENT_AMBULATORY_CARE_PROVIDER_SITE_OTHER): Payer: Medicare Other | Admitting: Cardiovascular Disease

## 2011-01-13 ENCOUNTER — Other Ambulatory Visit (INDEPENDENT_AMBULATORY_CARE_PROVIDER_SITE_OTHER): Payer: Medicare Other | Admitting: *Deleted

## 2011-01-13 VITALS — BP 162/74 | HR 84 | Ht 66.0 in | Wt 250.0 lb

## 2011-01-13 DIAGNOSIS — N189 Chronic kidney disease, unspecified: Secondary | ICD-10-CM

## 2011-01-13 DIAGNOSIS — I509 Heart failure, unspecified: Secondary | ICD-10-CM

## 2011-01-13 LAB — BASIC METABOLIC PANEL
BUN: 49 mg/dL — ABNORMAL HIGH (ref 6–23)
CO2: 20 mEq/L (ref 19–32)
Chloride: 111 mEq/L (ref 96–112)
Potassium: 5.5 mEq/L — ABNORMAL HIGH (ref 3.5–5.1)

## 2011-01-13 LAB — BRAIN NATRIURETIC PEPTIDE: Pro B Natriuretic peptide (BNP): 122 pg/mL — ABNORMAL HIGH (ref 0.0–100.0)

## 2011-01-13 NOTE — Assessment & Plan Note (Signed)
She's doing very well. She's able to exercise on a regular basis. He's been slowly losing some weight.  In discussing her diet with her, it is clear that she still eating a bit of salt. She was using sea salt as a substitute regular salt. I cautioned her about using a sea salt since it still is mostly sodium chloride. We will encourage her to use Mrs. Dash or other non-salt alternatives.

## 2011-01-13 NOTE — Assessment & Plan Note (Signed)
She has a creatinine of 2.5 at her last check. She'll be seeing her medical doctor tomorrow for repeat check.

## 2011-01-13 NOTE — Progress Notes (Signed)
Alexis [name] Date of Birth  08-08-1945 [facility] [id number] [place], [place]  [address] [phone]  Fax  [phone]  History of Present Illness:  65 year old female with a recent hospitalization in May for shortness of breath. She was found to have renal insufficiency. She also has mild left ventricular systolic dysfunction with an ejection fraction of around 45%.  She's been doing aerobic exercises for the past several months. She's lost about 8 pounds. She's not had any episodes of chest pain or shortness of breath.  Current Outpatient Prescriptions on File Prior to Visit  Medication Sig Dispense Refill  . amLODipine (NORVASC) 5 MG tablet Take 10 mg by mouth daily.       Marland Kitchen aspirin 325 MG tablet Take 325 mg by mouth daily.        . ferrous gluconate (FERGON) 325 MG tablet Take 325 mg by mouth daily with breakfast.        . furosemide (LASIX) 40 MG tablet Take 40 mg by mouth daily.       . insulin lispro protamine-insulin lispro (HUMALOG 75/25) (75-25) 100 UNIT/ML SUSP Inject 60 Units into the skin daily with breakfast. 20 UNITS AT NIGHT       . metoprolol (LOPRESSOR) 50 MG tablet Take 50 mg by mouth 2 (two) times daily.        Marland Kitchen olmesartan (BENICAR) 40 MG tablet Take 20 mg by mouth daily.         No Known Allergies  Past Medical History  Diagnosis Date  . Diabetes mellitus   . Hypertension   . Morbid obesity   . Coronary artery disease   . Chest pain   . 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 Hyperlipidemia   . Hx of breast cancer   . Chronic kidney disease   . SOB (shortness of breath)   . CHF (congestive heart failure)     Past Surgical History  Procedure Date  . Mastectomy   . Back surgery   . Tubal ligation   . Transthoracic echocardiogram 10/01/2010     Left ventricle: The cavity size was mildly dilated. Wall thickness was increased in a pattern of mild LVH. Systolic function was   mildly reduced. The  estimated ejection fraction was in the range  of 45% to 50%.   . Coronary angioplasty 12/19/2003    inferior wall hypokinesis. ef 50%    History  Smoking status  . Former Smoker  . Quit date: 05/16/2002  Smokeless tobacco  . Not on file    History  Alcohol Use No    Family History  Problem Relation Age of Onset  . Breast cancer Mother     Reviw of Systems:  Reviewed in the HPI.  All other systems are negative.  Physical Exam: BP 162/74  Pulse 84  Ht 5\' 6"  (1.676 m)  Wt 250 lb (113.399 kg)  BMI 40.35 kg/m2 The patient is alert and oriented x 3.  The mood and affect are normal.   Skin: warm and dry.  Color is normal.    HEENT:   the sclera are nonicteric.  The mucous membranes are moist.  The carotids are 2+ without bruits.  There is no thyromegaly.  There is no JVD.    Lungs: clear.  The chest wall is non tender.    Heart: regular rate with a normal S1 and S2.  There  are no murmurs, gallops, or rubs. The PMI is not displaced.     Abdomen: good bowel sounds.  There is no guarding or rebound.  There is no hepatosplenomegaly or tenderness.  There are no masses.   Extremities:  no clubbing, cyanosis, or edema.  The legs are without rashes.  The distal pulses are intact.   Neuro:  Cranial nerves II - XII are intact.  Motor and sensory functions are intact.    The gait is normal.  ECG:  Assessment / Plan:

## 2011-02-08 LAB — URINALYSIS, ROUTINE W REFLEX MICROSCOPIC
Bilirubin Urine: NEGATIVE
Glucose, UA: NEGATIVE
Specific Gravity, Urine: 1.022
Urobilinogen, UA: 0.2
pH: 6

## 2011-02-08 LAB — URINE MICROSCOPIC-ADD ON

## 2011-02-08 LAB — CBC
HCT: 37.9
MCHC: 33.6
MCV: 75.3 — ABNORMAL LOW
Platelets: 225
RBC: 5.04
WBC: 11.9 — ABNORMAL HIGH

## 2011-02-08 LAB — DIFFERENTIAL
Eosinophils Absolute: 0.5
Eosinophils Relative: 4
Lymphs Abs: 4.1 — ABNORMAL HIGH
Monocytes Relative: 7

## 2011-02-08 LAB — BASIC METABOLIC PANEL
BUN: 17
CO2: 23
Chloride: 103
Potassium: 4.1

## 2011-02-08 LAB — URINE CULTURE: Colony Count: >=100000

## 2011-02-08 LAB — POCT CARDIAC MARKERS
CKMB, poc: 3.6
Myoglobin, poc: 147
Troponin i, poc: <0.05

## 2011-02-18 LAB — CARDIAC PANEL(CRET KIN+CKTOT+MB+TROPI)
CK, MB: 3.2 ng/mL (ref 0.3–4.0)
CK, MB: 3.3 ng/mL (ref 0.3–4.0)
Relative Index: 1.5 (ref 0.0–2.5)
Relative Index: 1.6 (ref 0.0–2.5)
Total CK: 165 U/L (ref 7–177)
Total CK: 226 U/L — ABNORMAL HIGH (ref 7–177)
Troponin I: 0.01 ng/mL (ref 0.00–0.06)
Troponin I: 0.02 ng/mL (ref 0.00–0.06)

## 2011-02-18 LAB — DIFFERENTIAL
Basophils Absolute: 0.1 10*3/uL (ref 0.0–0.1)
Basophils Relative: 1 % (ref 0–1)
Eosinophils Absolute: 0.5 K/uL (ref 0.0–0.7)
Eosinophils Relative: 4 % (ref 0–5)
Lymphocytes Relative: 28 % (ref 12–46)
Lymphs Abs: 3.1 K/uL (ref 0.7–4.0)
Monocytes Absolute: 1 K/uL (ref 0.1–1.0)
Monocytes Relative: 8 % (ref 3–12)
Neutro Abs: 6.7 10*3/uL (ref 1.7–7.7)
Neutrophils Relative %: 59 % (ref 43–77)

## 2011-02-18 LAB — CBC
HCT: 38.4 % (ref 36.0–46.0)
Hemoglobin: 12.7 g/dL (ref 12.0–15.0)
MCHC: 32.9 g/dL (ref 30.0–36.0)
MCHC: 33.1 g/dL (ref 30.0–36.0)
MCV: 77.2 fL — ABNORMAL LOW (ref 78.0–100.0)
Platelets: 233 K/uL (ref 150–400)
RBC: 4.51 MIL/uL (ref 3.87–5.11)
RBC: 4.98 MIL/uL (ref 3.87–5.11)
RDW: 14.8 % (ref 11.5–15.5)
WBC: 11.3 K/uL — ABNORMAL HIGH (ref 4.0–10.5)
WBC: 8.1 10*3/uL (ref 4.0–10.5)

## 2011-02-18 LAB — D-DIMER, QUANTITATIVE: D-Dimer, Quant: 1.2 ug{FEU}/mL — ABNORMAL HIGH (ref 0.00–0.48)

## 2011-02-18 LAB — BASIC METABOLIC PANEL
CO2: 25 mEq/L (ref 19–32)
Calcium: 8.3 mg/dL — ABNORMAL LOW (ref 8.4–10.5)
Creatinine, Ser: 1.17 mg/dL (ref 0.4–1.2)
GFR calc Af Amer: 57 mL/min — ABNORMAL LOW (ref >60)

## 2011-02-18 LAB — POCT I-STAT, CHEM 8
Chloride: 105 mEq/L (ref 96–112)
HCT: 39 % (ref 36.0–46.0)
Hemoglobin: 13.3 g/dL (ref 12.0–15.0)
Potassium: 3.9 mEq/L (ref 3.5–5.1)
Sodium: 139 mEq/L (ref 135–145)

## 2011-02-18 LAB — GLUCOSE, CAPILLARY
Glucose-Capillary: 226 mg/dL — ABNORMAL HIGH (ref 70–99)
Glucose-Capillary: 278 mg/dL — ABNORMAL HIGH (ref 70–99)
Glucose-Capillary: 328 mg/dL — ABNORMAL HIGH (ref 70–99)
Glucose-Capillary: 341 mg/dL — ABNORMAL HIGH (ref 70–99)

## 2011-02-18 LAB — HEMOGLOBIN A1C: Hgb A1c MFr Bld: 10.9 % — ABNORMAL HIGH (ref 4.6–6.1)

## 2011-02-18 LAB — POCT CARDIAC MARKERS
CKMB, poc: 6.7 ng/mL (ref 1.0–8.0)
Myoglobin, poc: 188 ng/mL (ref 12–200)

## 2011-02-21 LAB — DIFFERENTIAL
Lymphocytes Relative: 35
Lymphs Abs: 3.7
Monocytes Relative: 5
Neutro Abs: 5.8
Neutrophils Relative %: 54

## 2011-02-21 LAB — BASIC METABOLIC PANEL
Calcium: 9
Creatinine, Ser: 1.15
GFR calc Af Amer: 58 — ABNORMAL LOW
Sodium: 132 — ABNORMAL LOW

## 2011-02-21 LAB — CBC
RBC: 5.35 — ABNORMAL HIGH
WBC: 10.6 — ABNORMAL HIGH

## 2011-02-25 LAB — I-STAT 8, (EC8 V) (CONVERTED LAB)
Acid-base deficit: 1
Glucose, Bld: 135 — ABNORMAL HIGH
Hemoglobin: 15
Potassium: 4.6
Sodium: 137
TCO2: 27

## 2011-02-25 LAB — URINE CULTURE

## 2011-02-25 LAB — URINE MICROSCOPIC-ADD ON

## 2011-02-25 LAB — URINALYSIS, ROUTINE W REFLEX MICROSCOPIC
Glucose, UA: >1000 — AB
Specific Gravity, Urine: 1.03
Urobilinogen, UA: 0.2
pH: 5

## 2011-03-25 ENCOUNTER — Encounter (HOSPITAL_COMMUNITY): Payer: Self-pay | Admitting: Family Medicine

## 2011-03-25 ENCOUNTER — Observation Stay (HOSPITAL_COMMUNITY)
Admission: EM | Admit: 2011-03-25 | Discharge: 2011-03-26 | Disposition: A | Discharge status: Home / Self Care | Payer: Medicare Other | Attending: Cardiology | Admitting: Cardiology

## 2011-03-25 ENCOUNTER — Other Ambulatory Visit: Payer: Self-pay

## 2011-03-25 ENCOUNTER — Emergency Department (HOSPITAL_COMMUNITY): Payer: Medicare Other

## 2011-03-25 DIAGNOSIS — R110 Nausea: Secondary | ICD-10-CM | POA: Insufficient documentation

## 2011-03-25 DIAGNOSIS — I700 Atherosclerosis of aorta: Secondary | ICD-10-CM | POA: Insufficient documentation

## 2011-03-25 DIAGNOSIS — R0602 Shortness of breath: Secondary | ICD-10-CM | POA: Insufficient documentation

## 2011-03-25 DIAGNOSIS — D509 Iron deficiency anemia, unspecified: Secondary | ICD-10-CM | POA: Insufficient documentation

## 2011-03-25 DIAGNOSIS — E119 Type 2 diabetes mellitus without complications: Secondary | ICD-10-CM | POA: Insufficient documentation

## 2011-03-25 DIAGNOSIS — R61 Generalized hyperhidrosis: Secondary | ICD-10-CM | POA: Insufficient documentation

## 2011-03-25 DIAGNOSIS — R0789 Other chest pain: Principal | ICD-10-CM | POA: Insufficient documentation

## 2011-03-25 DIAGNOSIS — I129 Hypertensive chronic kidney disease with stage 1 through stage 4 chronic kidney disease, or unspecified chronic kidney disease: Secondary | ICD-10-CM | POA: Insufficient documentation

## 2011-03-25 DIAGNOSIS — I517 Cardiomegaly: Secondary | ICD-10-CM | POA: Insufficient documentation

## 2011-03-25 DIAGNOSIS — I2510 Atherosclerotic heart disease of native coronary artery without angina pectoris: Secondary | ICD-10-CM | POA: Diagnosis present

## 2011-03-25 DIAGNOSIS — R079 Chest pain, unspecified: Secondary | ICD-10-CM

## 2011-03-25 DIAGNOSIS — E118 Type 2 diabetes mellitus with unspecified complications: Secondary | ICD-10-CM | POA: Diagnosis present

## 2011-03-25 DIAGNOSIS — E785 Hyperlipidemia, unspecified: Secondary | ICD-10-CM | POA: Insufficient documentation

## 2011-03-25 DIAGNOSIS — I252 Old myocardial infarction: Secondary | ICD-10-CM | POA: Insufficient documentation

## 2011-03-25 DIAGNOSIS — I200 Unstable angina: Secondary | ICD-10-CM

## 2011-03-25 DIAGNOSIS — N189 Chronic kidney disease, unspecified: Secondary | ICD-10-CM | POA: Insufficient documentation

## 2011-03-25 DIAGNOSIS — I509 Heart failure, unspecified: Secondary | ICD-10-CM | POA: Insufficient documentation

## 2011-03-25 DIAGNOSIS — E669 Obesity, unspecified: Secondary | ICD-10-CM | POA: Insufficient documentation

## 2011-03-25 DIAGNOSIS — I428 Other cardiomyopathies: Secondary | ICD-10-CM | POA: Insufficient documentation

## 2011-03-25 LAB — CBC
Hemoglobin: 10.1 g/dL — ABNORMAL LOW (ref 12.0–15.0)
MCH: 24.3 pg — ABNORMAL LOW (ref 26.0–34.0)
MCH: 24.2 pg — ABNORMAL LOW (ref 26.0–34.0)
MCHC: 32.9 g/dL (ref 30.0–36.0)
MCV: 74 fL — ABNORMAL LOW (ref 78.0–100.0)
Platelets: 264 10*3/uL (ref 150–400)
Platelets: 276 10*3/uL (ref 150–400)
RBC: 4.17 MIL/uL (ref 3.87–5.11)
RDW: 15.4 % (ref 11.5–15.5)
WBC: 11.4 10*3/uL — ABNORMAL HIGH (ref 4.0–10.5)
WBC: 13.2 10*3/uL — ABNORMAL HIGH (ref 4.0–10.5)

## 2011-03-25 LAB — BASIC METABOLIC PANEL
BUN: 54 mg/dL — ABNORMAL HIGH (ref 6–23)
CO2: 21 mEq/L (ref 19–32)
Chloride: 107 mEq/L (ref 96–112)
Glucose, Bld: 137 mg/dL — ABNORMAL HIGH (ref 70–99)
Potassium: 4.7 mEq/L (ref 3.5–5.1)
Sodium: 137 mEq/L (ref 135–145)

## 2011-03-25 LAB — CARDIAC PANEL(CRET KIN+CKTOT+MB+TROPI)
CK, MB: 3.9 ng/mL (ref 0.3–4.0)
CK, MB: 4 ng/mL (ref 0.3–4.0)
Total CK: 193 U/L — ABNORMAL HIGH (ref 7–177)
Total CK: 196 U/L — ABNORMAL HIGH (ref 7–177)
Troponin I: <0.3 ng/mL (ref <0.30)

## 2011-03-25 LAB — DIFFERENTIAL
Lymphocytes Relative: 25 % (ref 12–46)
Lymphs Abs: 3.3 10*3/uL (ref 0.7–4.0)
Monocytes Relative: 9 % (ref 3–12)
Neutro Abs: 7.8 10*3/uL — ABNORMAL HIGH (ref 1.7–7.7)
Neutrophils Relative %: 59 % (ref 43–77)

## 2011-03-25 LAB — GLUCOSE, CAPILLARY: Glucose-Capillary: 136 mg/dL — ABNORMAL HIGH (ref 70–99)

## 2011-03-25 LAB — PROTIME-INR: INR: 1.05 (ref 0.00–1.49)

## 2011-03-25 LAB — POCT I-STAT, CHEM 8
Calcium, Ion: 1.17 mmol/L (ref 1.12–1.32)
HCT: 31 % — ABNORMAL LOW (ref 36.0–46.0)
Hemoglobin: 10.5 g/dL — ABNORMAL LOW (ref 12.0–15.0)
TCO2: 20 mmol/L (ref 0–100)

## 2011-03-25 LAB — POCT I-STAT TROPONIN I: Troponin i, poc: 0.02 ng/mL (ref 0.00–0.08)

## 2011-03-25 LAB — APTT: aPTT: 21 seconds — ABNORMAL LOW (ref 24–37)

## 2011-03-25 LAB — HEMOGLOBIN A1C: Hgb A1c MFr Bld: 6.7 % — ABNORMAL HIGH (ref <5.7)

## 2011-03-25 MED ORDER — METOPROLOL TARTRATE 25 MG PO TABS: 50.0000 mg | ORAL_TABLET | Freq: Two times a day (BID) | ORAL | Status: DC

## 2011-03-25 MED ORDER — SODIUM CHLORIDE 0.9 % IV SOLN: 250.0000 mL | INTRAVENOUS | Status: DC

## 2011-03-25 MED ORDER — NITROGLYCERIN 0.4 MG SL SUBL
0.4000 mg | SUBLINGUAL_TABLET | SUBLINGUAL | Status: DC | PRN
  Administered 2011-03-25 (×2): 0.4 mg via SUBLINGUAL
  Filled 2011-03-25: qty 25

## 2011-03-25 MED ORDER — AMLODIPINE BESYLATE 10 MG PO TABS
10.0000 mg | ORAL_TABLET | Freq: Every day | ORAL | Status: DC
  Administered 2011-03-26: 10 mg via ORAL
  Filled 2011-03-25: qty 1

## 2011-03-25 MED ORDER — ASPIRIN 300 MG RE SUPP: 300.0000 mg | RECTAL | Status: DC

## 2011-03-25 MED ORDER — ONDANSETRON HCL 4 MG/2ML IJ SOLN: 4.0000 mg | Freq: Four times a day (QID) | INTRAMUSCULAR | Status: DC | PRN

## 2011-03-25 MED ORDER — AMLODIPINE BESYLATE 10 MG PO TABS
10.0000 mg | ORAL_TABLET | Freq: Every day | ORAL | Status: DC
  Administered 2011-03-25: 10 mg via ORAL
  Filled 2011-03-25: qty 1

## 2011-03-25 MED ORDER — ASPIRIN 81 MG PO CHEW
324.0000 mg | CHEWABLE_TABLET | Freq: Once | ORAL | Status: AC
  Administered 2011-03-25: 324 mg via ORAL
  Filled 2011-03-25: qty 4

## 2011-03-25 MED ORDER — INSULIN ASPART PROT & ASPART (70-30 MIX) 100 UNIT/ML ~~LOC~~ SUSP: 30.0000 [IU] | Freq: Two times a day (BID) | SUBCUTANEOUS | Status: DC

## 2011-03-25 MED ORDER — OLMESARTAN MEDOXOMIL 20 MG PO TABS
20.0000 mg | ORAL_TABLET | Freq: Every day | ORAL | Status: DC
  Administered 2011-03-25: 20 mg via ORAL
  Filled 2011-03-25: qty 1

## 2011-03-25 MED ORDER — ALUM & MAG HYDROXIDE-SIMETH 200-200-20 MG/5ML PO SUSP: 15.0000 mL | ORAL | Status: DC | PRN

## 2011-03-25 MED ORDER — FUROSEMIDE 40 MG PO TABS
40.0000 mg | ORAL_TABLET | Freq: Two times a day (BID) | ORAL | Status: DC
  Administered 2011-03-25 – 2011-03-26 (×2): 40 mg via ORAL
  Filled 2011-03-25 (×3): qty 1

## 2011-03-25 MED ORDER — NITROGLYCERIN 0.4 MG SL SUBL: 0.4000 mg | SUBLINGUAL_TABLET | SUBLINGUAL | Status: DC | PRN

## 2011-03-25 MED ORDER — SODIUM CHLORIDE 0.9 % IJ SOLN: 3.0000 mL | INTRAMUSCULAR | Status: DC | PRN

## 2011-03-25 MED ORDER — ASPIRIN 300 MG RE SUPP
300.0000 mg | RECTAL | Status: DC
  Filled 2011-03-25: qty 1

## 2011-03-25 MED ORDER — INSULIN ASPART PROT & ASPART (70-30 MIX) 100 UNIT/ML ~~LOC~~ SUSP
30.0000 [IU] | Freq: Two times a day (BID) | SUBCUTANEOUS | Status: DC
  Filled 2011-03-25: qty 3

## 2011-03-25 MED ORDER — ASPIRIN EC 81 MG PO TBEC
81.0000 mg | DELAYED_RELEASE_TABLET | Freq: Every day | ORAL | Status: DC
  Administered 2011-03-26: 81 mg via ORAL
  Filled 2011-03-25: qty 1

## 2011-03-25 MED ORDER — OLMESARTAN MEDOXOMIL 20 MG PO TABS
20.0000 mg | ORAL_TABLET | Freq: Every day | ORAL | Status: DC
  Administered 2011-03-26: 20 mg via ORAL
  Filled 2011-03-25: qty 1

## 2011-03-25 MED ORDER — FUROSEMIDE 40 MG PO TABS
40.0000 mg | ORAL_TABLET | Freq: Two times a day (BID) | ORAL | Status: DC
  Filled 2011-03-25: qty 1

## 2011-03-25 MED ORDER — ASPIRIN 325 MG PO TABS: 325.0000 mg | ORAL_TABLET | Freq: Every day | ORAL | Status: DC

## 2011-03-25 MED ORDER — ACETAMINOPHEN 325 MG PO TABS: 650.0000 mg | ORAL_TABLET | ORAL | Status: DC | PRN

## 2011-03-25 MED ORDER — ASPIRIN 81 MG PO CHEW
324.0000 mg | CHEWABLE_TABLET | ORAL | Status: DC
  Filled 2011-03-25: qty 4

## 2011-03-25 MED ORDER — SODIUM CHLORIDE 0.9 % IJ SOLN
3.0000 mL | Freq: Two times a day (BID) | INTRAMUSCULAR | Status: DC
  Administered 2011-03-25 – 2011-03-26 (×2): 3 mL via INTRAVENOUS

## 2011-03-25 MED ORDER — METOPROLOL TARTRATE 50 MG PO TABS
50.0000 mg | ORAL_TABLET | Freq: Two times a day (BID) | ORAL | Status: DC
  Administered 2011-03-25 – 2011-03-26 (×2): 50 mg via ORAL
  Filled 2011-03-25 (×3): qty 1

## 2011-03-25 NOTE — ED Notes (Signed)
Pt states "chest pain started about 12:00 today, I thought it was indigestion, I usually have some NTG pills but I haven't been to the doctor to get some"; denies n/v, is diaphoretic

## 2011-03-25 NOTE — ED Notes (Signed)
Pt is A/O x4. Skin warm and dry. Respirations even and unlabored. NAD noted at this time.

## 2011-03-25 NOTE — H&P (Signed)
CARDIOLOGY ADMISSION NOTE  Patient ID: Alexis [name] MRN: [id number] DOB/AGE: [birth year] 65 y.o.  Admit date: 03/25/2011 Primary Physician Philis Fendt, MD Primary Cardiologist  Dr. Acie Fredrickson Chief Complaint    Chest pain  The patient presents for evaluation of chest discomfort. She has a history of coronary disease as described below. Her last stress test she reports was about 2 years ago. She has been under emotional stress as her brother residual. Today while visiting him in the hospital she had chest discomfort. She described this as a heaviness. It is under her left breast. It is similar to previous angina though less intense. It did get worse with deep breathing. She had some mild nausea but no vomiting. She was mildly short of breath. She had some diaphoresis. She said the pain was 7/10 at its peak. She did have nitroglycerin with perhaps some improvement. She actually took Rolaids without improvement. She had no radiation to her arm or to her neck.  Her EKG was unchanged from previous without acute ST segment changes.  Initial troponin was negative. The patient had otherwise been feeling well. She was able to do aerobics and climb her stairs without bringing on any chest discomfort. She might get mildly dyspneic. She denies any PND or orthopnea. She's had no presyncope or syncope. She's had a 10 pound weight loss intentionally. She's had no edema.  Catheterization 2005 demonstrated 30% LAD stenosis. First diagonal had 30-40% stenosis. Circumflex and obtuse marginal with an inferior branch 90% stenosis which was managed medically. The right coronary artery was occluded prior to the previously placed stent. This was managed medically.  Her last echo earlier this year showed an EF of 45% with severe hypokinesis of the mid-distal posterolateral wall.   Past Medical History  Diagnosis Date  . Diabetes mellitus   . Hypertension   . Morbid obesity   . Coronary artery disease   . Chest pain   .  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 Hyperlipidemia   . Hx of breast cancer   . Chronic kidney disease   . SOB (shortness of breath)   . CHF (congestive heart failure)     Past Surgical History  Procedure Date  . Mastectomy     Left  . Back surgery   . Tubal ligation   . Transthoracic echocardiogram 10/01/2010     Left ventricle: The cavity size was mildly dilated. Wall thickness was increased in a pattern of mild LVH. Systolic function was   mildly reduced. The estimated ejection fraction was in the range  of 45% to 50%.   . Coronary angioplasty 12/19/2003    inferior wall hypokinesis. ef 50%    Family History  Problem Relation Age of Onset  . Breast cancer Mother     Died age 54     (Not in a hospital admission) No Known Allergies History   Social History  . Marital Status: Single    Spouse Name: N/A    Number of Children: 3  . Years of Education: N/A   Occupational History  . Retired    Social History Main Topics  . Smoking status: Former Smoker -- 1.0 packs/day for 30 years    Types: Cigarettes    Quit date: 05/16/2002  . Smokeless tobacco: Not on file  . Alcohol Use: No  . Drug Use: No  . Sexually Active: Not on file   Other Topics Concern  . Not on file   Social History  Narrative   Three grandchildren and a one year old great grandchild     Physical Exam: Blood pressure 174/59, pulse 70, temperature 98.6 F (37 C), temperature source Oral, resp. rate 19, weight 110.224 kg (243 lb), SpO2 98.00%.   GENERAL:  Well appearing HEENT:  Pupils equal round and reactive, fundi not visualized, oral mucosa unremarkable NECK:  No jugular venous distention, waveform within normal limits, carotid upstroke brisk and symmetric, no bruits, no thyromegaly LYMPHATICS:  No cervical, inguinal adenopathy LUNGS:  Clear to auscultation bilaterally BACK:  No CVA tenderness CHEST:  Unremarkable HEART:  PMI not displaced or sustained,S1 and S2 within normal limits, no S3, no S4, no  clicks, no rubs, no murmurs ABD:  Flat, positive bowel sounds normal in frequency in pitch, no bruits, no rebound, no guarding, no midline pulsatile mass, no hepatomegaly, no splenomegaly, obese EXT:  2 plus pulses throughout, no edema, no cyanosis no clubbing SKIN:  No rashes no nodules NEURO:  Cranial nerves II through XII grossly intact, motor grossly intact throughout PSYCH:  Cognitively intact, oriented to person place and time  Labs: Lab Results  Component Value Date   BUN 73* 03/25/2011   Lab Results  Component Value Date   CREATININE 2.80* 03/25/2011   Lab Results  Component Value Date   NA 139 03/25/2011   K 5.3* 03/25/2011   CL 114* 03/25/2011   CO2 20 01/13/2011    Lab Results  Component Value Date   WBC 11.4* 03/25/2011   HGB 10.5* 03/25/2011   HCT 31.0* 03/25/2011   MCV 74.0* 03/25/2011   PLT 264 03/25/2011     Radiology:   Cardiomegaly.  Central pulmonary vascular prominence.  Calcified tortuous aorta.  EKG: Sinus rhythm, rate 68, left axis deviation, lateral T wave inversions unchanged from previous  ASSESSMENT AND PLAN: Chest pain:  The pain is somewhat atypical. There are no objective evidence of ischemia. Cycle cardiac enzymes. Will bring the patient in for observation. If her enzymes, followup EKG and other labs are unremarkable I think she could be discharged in the morning for outpatient stress perfusion imaging.  Diabetes:  I will continue current meds. Check a hemoglobin A1c.  Obesity: The patient understands the need to lose weight with diet and exercise. We have discussed specific strategies for this.  CKD:  Her creat is elevated above baseline.  We will repeat this in the am  Cardiomyopathy:  Her EF is mildly reduced.  I will continue the meds as listed.     SignedMinus Breeding 03/25/2011, 5:42 PM

## 2011-03-25 NOTE — ED Provider Notes (Signed)
History     CSN: [id number] Arrival date & time: 03/25/2011  2:51 PM   First MD Initiated Contact with Patient 03/25/11 1534      Chief Complaint  Patient presents with  . Chest Pain   and planes of anterior chest pain dull in quality, nonradiating, accompanied by shortness of breath sweatiness onset 12 noon today. Treated with Tums prior to coming here, without relief associated  and sweatines and dyspnea, feels like heart pain she's had before no treatment prior to coming here states she ran out of nitroglycerin  (Consider location/radiation/quality/duration/timing/severity/associated sxs/prior treatment) HPI  Past Medical History  Diagnosis Date  . Diabetes mellitus   . Hypertension   . Morbid obesity   . Coronary artery disease   . Chest pain   . 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 Hyperlipidemia   . Hx of breast cancer   . Chronic kidney disease   . SOB (shortness of breath)   . CHF (congestive heart failure)     Past Surgical History  Procedure Date  . Mastectomy   . Back surgery   . Tubal ligation   . Transthoracic echocardiogram 10/01/2010     Left ventricle: The cavity size was mildly dilated. Wall thickness was increased in a pattern of mild LVH. Systolic function was   mildly reduced. The estimated ejection fraction was in the range  of 45% to 50%.   . Coronary angioplasty 12/19/2003    inferior wall hypokinesis. ef 50%    Family History  Problem Relation Age of Onset  . Breast cancer Mother     History  Substance Use Topics  . Smoking status: Former Smoker    Quit date: 05/16/2002  . Smokeless tobacco: Not on file  . Alcohol Use: No    OB History    Grav Para Term Preterm Abortions TAB SAB Ect Mult Living                  Review of Systems  Constitutional: Negative.   HENT: Negative.   Respiratory: Negative.   Cardiovascular: Positive for chest pain.       Sweatiness shortness of breath  Gastrointestinal: Negative.   Musculoskeletal: Negative.     Skin: Negative.   Neurological: Negative.   Hematological: Negative.   Psychiatric/Behavioral: Negative.     Allergies  Review of patient's allergies indicates no known allergies.  Home Medications   Current Outpatient Rx  Name Route Sig Dispense Refill  . AMLODIPINE BESYLATE 10 MG PO TABS Oral Take 10 mg by mouth daily.      . ASPIRIN 325 MG PO TABS Oral Take 325 mg by mouth daily.      Marland Kitchen FERROUS GLUCONATE 325 MG PO TABS Oral Take 325 mg by mouth daily with breakfast.      . FUROSEMIDE 40 MG PO TABS Oral Take 40 mg by mouth 2 (two) times daily.     . INSULIN ASPART PROT & ASPART (70-30) 100 UNIT/ML [place] SUSP Subcutaneous Inject 30-80 Units into the skin 2 (two) times daily with a meal. Inject 30 units in the evening and 80 units in the morning     . METOPROLOL TARTRATE 50 MG PO TABS Oral Take 50 mg by mouth 2 (two) times daily.      Marland Kitchen OLMESARTAN MEDOXOMIL 20 MG PO TABS Oral Take 20 mg by mouth daily.        BP 145/51  Pulse 72  Temp(Src) 98.6 F (37  C) (Oral)  Resp 18  Wt 243 lb (110.224 kg)  SpO2 98%  Physical Exam  Nursing note and vitals reviewed. Constitutional: She appears well-developed and well-nourished.  HENT:  Head: Normocephalic and atraumatic.  Eyes: Conjunctivae are normal. Pupils are equal, round, and reactive to light.  Neck: Neck supple. No tracheal deviation present. No thyromegaly present.  Cardiovascular: Normal rate and regular rhythm.   No murmur heard. Pulmonary/Chest: Effort normal and breath sounds normal.  Abdominal: Soft. Bowel sounds are normal. She exhibits no distension. There is no tenderness.       obese  Musculoskeletal: Normal range of motion. She exhibits no edema and no tenderness.  Neurological: She is alert. Coordination normal.  Skin: Skin is warm and dry. No rash noted.  Psychiatric: She has a normal mood and affect.    ED Course  Procedures (including critical care time)   Labs Reviewed  I-STAT, CHEM 8  CBC  I-STAT  TROPONIN I   No results found.   No diagnosis found.   Date: 03/25/2011  Rate: 70  Rhythm: normal sinus rhythm  QRS Axis: left  Intervals: normal  ST/T Wave abnormalities: normal and nonspecific ST/T changes  Conduction Disutrbances:nonspecific intraventricular conduction delay  Narrative Interpretation:   Old EKG Reviewed: unchanged No change from 10/06/10  4:25 PM [facility] who will come to evaluate. 4:55 PM patient asymptomatic, pain free after treatment with 2 sublingual nitroglycerin tablets Results for orders placed during the hospital encounter of 03/25/11  CBC      Component Value Range   WBC 11.4 (*) 4.0 - 10.5 (K/uL)   RBC 4.11  3.87 - 5.11 (MIL/uL)   Hemoglobin 10.0 (*) 12.0 - 15.0 (g/dL)   HCT 30.4 (*) 36.0 - 46.0 (%)   MCV 74.0 (*) 78.0 - 100.0 (fL)   MCH 24.3 (*) 26.0 - 34.0 (pg)   MCHC 32.9  30.0 - 36.0 (g/dL)   RDW 15.4  11.5 - 15.5 (%)   Platelets 264  150 - 400 (K/uL)  POCT I-STAT, CHEM 8      Component Value Range   Sodium 139  135 - 145 (mEq/L)   Potassium 5.3 (*) 3.5 - 5.1 (mEq/L)   Chloride 114 (*) 96 - 112 (mEq/L)   BUN 73 (*) 6 - 23 (mg/dL)   Creatinine, Ser 2.80 (*) 0.50 - 1.10 (mg/dL)   Glucose, Bld 84  70 - 99 (mg/dL)   Calcium, Ion 1.17  1.12 - 1.32 (mmol/L)   TCO2 20  0 - 100 (mmol/L)   Hemoglobin 10.5 (*) 12.0 - 15.0 (g/dL)   HCT 31.0 (*) 36.0 - 46.0 (%)  POCT I-STAT TROPONIN I      Component Value Range   Troponin i, poc 0.02  0.00 - 0.08 (ng/mL)   Comment 3             MDM  Diagnosis unstable angina #2 renal insufficiency #3 knee Cardiology to evaluate patient for admission       Orlie Dakin, MD 03/25/11 1703

## 2011-03-25 NOTE — ED Notes (Signed)
Gave verbal report to RN on [place] will be transferred via stretcher to floor. Informed RN concerning problems with order entry with previous Cardiologist. Informed RN that on call Cardiologist could be notified for help with order entry.

## 2011-03-25 NOTE — ED Notes (Signed)
Report given to [place], Therapist, sports. Pt to move to TCU room 28.

## 2011-03-25 NOTE — ED Notes (Signed)
EPIC staff Informed Dr. Minus Breeding with Cardiology that he could not sign orders that the orders will not transfer to floor if signed. Dr. Percival Spanish stated "that he placed orders and did consult and he is done and beeper off. Please call on call MD of problems." Notify on call Cardiologist concerning this matter. Epic personnel helped on call cardiology with this matter and placing orders on hold for floor. Pt has bed request in place and explained delay to pt. Awaiting for room to become available for placement and transfer to floor.

## 2011-03-25 NOTE — ED Notes (Signed)
Almyra Free, RN taking over care of pt. Care transferred.

## 2011-03-26 ENCOUNTER — Other Ambulatory Visit: Payer: Self-pay

## 2011-03-26 LAB — GLUCOSE, CAPILLARY: Glucose-Capillary: 134 mg/dL — ABNORMAL HIGH (ref 70–99)

## 2011-03-26 MED ORDER — ASPIRIN 81 MG PO TBEC: 81.0000 mg | DELAYED_RELEASE_TABLET | Freq: Every day | ORAL | Status: DC

## 2011-03-26 MED ORDER — FUROSEMIDE 40 MG PO TABS: 40.0000 mg | ORAL_TABLET | Freq: Two times a day (BID) | ORAL | Status: DC

## 2011-03-26 MED ORDER — FERROUS GLUCONATE 325 MG PO TABS: 325.0000 mg | ORAL_TABLET | Freq: Every day | ORAL | Status: DC

## 2011-03-26 MED ORDER — NITROGLYCERIN 0.4 MG SL SUBL: 0.4000 mg | SUBLINGUAL_TABLET | SUBLINGUAL | Status: DC | PRN

## 2011-03-26 NOTE — Discharge Summary (Signed)
Physician Discharge Summary  Patient ID: Alexis [name] MRN: [id number] DOB/AGE: 05-19-1945 65 y.o.  Admit date: 03/25/2011 Discharge date: 03/26/2011  Principal Problem:  *Chest pain Active Problems:  CHF (congestive heart failure)  CKD (chronic kidney disease)  Obesity  CAD (coronary artery disease)  Diabetes mellitus   Primary Discharge Diagnosis: Chest Pain Secondary Discharge Diagnosis:  1. CHF  2. CKD  3. Obesity   Significant Diagnostic Studies:None  Consults: None  Hospital Course: Alexis [name] is a 65 y/o patient of Dr. Acie Fredrickson who was admitted with chest pain with known history of CAD. Pain was similar to angina but not as intense.  It was noted that cardiac catheterization in 2005 demonstrated 30% LAD stenosis. First diagonal had 30-40% stenosis. Circumflex and obtuse marginal with an inferior branch 90% stenosis which was managed medically. The right coronary artery was occluded prior to the previously placed stent. This was managed medically. Her last echo earlier this year showed an EF of 45% with severe hypokinesis of the mid-distal posterolateral wall.  She was admitted for observation. Cardiac enzymes were found to be normal. EKG did not show evidence of new ischemia. She was pain free during hospitalization. He was presumed to be noncardiac, anxiety induced discomfort from atypical features.  She will be discharged today on current home medications and follow-up with Dr. Acie Fredrickson. A stress myoview will be planned prior to that appointment.   Discharge Exam: Blood pressure 130/72, pulse 70, temperature 98.4 F (36.9 C), temperature source Oral, resp. rate 22, height 5\' 6"  (1.676 m), weight 249 lb 14.4 oz (113.354 kg), SpO2 99.00%.   Labs:   Lab Results  Component Value Date   WBC 13.2* 03/25/2011   HGB 10.1* 03/25/2011   HCT 30.9* 03/25/2011   MCV 74.1* 03/25/2011   PLT 276 03/25/2011    Lab 03/25/11 2239  NA 137  K 4.7  CL 107  CO2 21  BUN 54*  CREATININE  2.71*  CALCIUM 9.3  PROT --  BILITOT --  ALKPHOS --  ALT --  AST --  GLUCOSE 137*   Lab Results  Component Value Date   CKTOTAL 190* 03/26/2011   CKMB 3.5 03/26/2011   TROPONINI <0.30 03/26/2011    Lab Results  Component Value Date   CHOL  Value: 255        ATP III CLASSIFICATION:  <200     mg/dL   Desirable  200-239  mg/dL   Borderline High  >=240    mg/dL   High       * 08/08/2008   CHOL  Value: 227        ATP III CLASSIFICATION:  <200     mg/dL   Desirable  200-239  mg/dL   Borderline High  >=240    mg/dL   High       * 08/08/2008   Lab Results  Component Value Date   HDL 45 08/08/2008   HDL 41 08/08/2008   Lab Results  Component Value Date   LDLCALC  Value: 186        Total Cholesterol/HDL:CHD Risk Coronary Heart Disease Risk Table                     Men   Women  1/2 Average Risk   3.4   3.3  Average Risk       5.0   4.4  2 X Average Risk   9.6   7.1  3 X Average  Risk  23.4   11.0        Use the calculated Patient Ratio above and the CHD Risk Table to determine the patient's CHD Risk.        ATP III CLASSIFICATION (LDL):  <100     mg/dL   Optimal  100-129  mg/dL   Near or Above                    Optimal  130-159  mg/dL   Borderline  160-189  mg/dL   High  >190     mg/dL   Very High* 08/08/2008   LDLCALC  Value: 157        Total Cholesterol/HDL:CHD Risk Coronary Heart Disease Risk Table                     Men   Women  1/2 Average Risk   3.4   3.3  Average Risk       5.0   4.4  2 X Average Risk   9.6   7.1  3 X Average Risk  23.4   11.0        Use the calculated Patient Ratio above and the CHD Risk Table to determine the patient's CHD Risk.        ATP III CLASSIFICATION (LDL):  <100     mg/dL   Optimal  100-129  mg/dL   Near or Above                    Optimal  130-159  mg/dL   Borderline  160-189  mg/dL   High  >190     mg/dL   Very High* 08/08/2008   Lab Results  Component Value Date   TRIG 121 08/08/2008   TRIG 143 08/08/2008   Lab Results  Component Value Date   CHOLHDL 5.7  08/08/2008   CHOLHDL 5.5 08/08/2008       Radiology: Dg Chest Portable 1 View  03/25/2011  *RADIOLOGY REPORT*  Clinical Data: Left-sided chest pain. Shortness of breath.  PORTABLE CHEST - 1 VIEW  .  IMPRESSION: Cardiomegaly.  Central pulmonary vascular prominence.  Calcified tortuous aorta.  Original Report Authenticated By: Doug Sou, M.D.    FOLLOW UP PLANS AND APPOINTMENTS:  1. Stress test-myoview 2. Follow-up with Dr. Acie Fredrickson for discussion of results   Current Discharge Medication List    CONTINUE these medications which have NOT CHANGED   Details  amLODipine (NORVASC) 10 MG tablet Take 10 mg by mouth daily.      aspirin 325 MG tablet Take 325 mg by mouth daily.      furosemide (LASIX) 40 MG tablet Take 40 mg by mouth 2 (two) times daily.     insulin aspart protamine-insulin aspart (NOVOLOG 70/30) (70-30) 100 UNIT/ML injection Inject 30-80 Units into the skin 2 (two) times daily with a meal. Inject 30 units in the evening and 80 units in the morning     metoprolol (LOPRESSOR) 50 MG tablet Take 50 mg by mouth 2 (two) times daily.      olmesartan (BENICAR) 20 MG tablet Take 20 mg by mouth daily.        STOP taking these medications     ferrous gluconate (FERGON) 325 MG tablet        Follow-up Information    Follow up with Darden Amber., MD. (Office will call you for appointment)    Contact information:   [phone]  [place] [address] [place] [phone]       Please follow up. (Stress test prior to appointment)            Time spent with patient to include physician time:40 minutes Signed: Jory Sims 03/26/2011, 9:09 AM Co-Sign MD

## 2011-03-26 NOTE — Progress Notes (Signed)
Subjective: No chest pain or SOB   Physical Exam:   Blood pressure 130/72, pulse 70, temperature 98.4 F (36.9 C), temperature source Oral, resp. rate 22, height 5\' 6"  (1.676 m), weight 249 lb 14.4 oz (113.354 kg), SpO2 99.00%. Temp:  [98.4 F (36.9 C)-98.9 F (37.2 C)] 98.4 F (36.9 C) (11/10 0500) Pulse Rate:  [70-78] 70  (11/10 0500) Resp:  [14-24] 22  (11/10 0500) BP: (118-174)/(45-79) 130/72 mmHg (11/10 0500) SpO2:  [97 %-100 %] 99 % (11/10 0500) Weight:  [243 lb (110.224 kg)-253 lb 11.2 oz (115.078 kg)] 249 lb 14.4 oz (113.354 kg) (11/10 0500) Weight change:  I/O 11/09 0701 - 11/10 0700 In: -  Out: 800 [Urine:800]  HEENT-normal/normal eyelids Neck supple chest - CTA/ normal expansion CV - RRR/normal S1 and S2; no murmurs, rubs or gallops Abdomen -NT/ND, no HSM, no mass, + bowel sounds, no bruit 2+ femoral pulses, no bruits Ext-no edema, chords, 2+ DP Neuro-grossly nonfocal  Results for orders placed during the hospital encounter of 03/25/11 (from the past 48 hour(s))  CBC     Status: Abnormal   Collection Time   03/25/11  4:26 PM      Component Value Range Comment   WBC 11.4 (*) 4.0 - 10.5 (K/uL)    RBC 4.11  3.87 - 5.11 (MIL/uL)    Hemoglobin 10.0 (*) 12.0 - 15.0 (g/dL)    HCT 30.4 (*) 36.0 - 46.0 (%)    MCV 74.0 (*) 78.0 - 100.0 (fL)    MCH 24.3 (*) 26.0 - 34.0 (pg)    MCHC 32.9  30.0 - 36.0 (g/dL)    RDW 15.4  11.5 - 15.5 (%)    Platelets 264  150 - 400 (K/uL)   POCT I-STAT, CHEM 8     Status: Abnormal   Collection Time   03/25/11  4:40 PM      Component Value Range Comment   Sodium 139  135 - 145 (mEq/L)    Potassium 5.3 (*) 3.5 - 5.1 (mEq/L)    Chloride 114 (*) 96 - 112 (mEq/L)    BUN 73 (*) 6 - 23 (mg/dL)    Creatinine, Ser 2.80 (*) 0.50 - 1.10 (mg/dL)    Glucose, Bld 84  70 - 99 (mg/dL)    Calcium, Ion 1.17  1.12 - 1.32 (mmol/L)    TCO2 20  0 - 100 (mmol/L)    Hemoglobin 10.5 (*) 12.0 - 15.0 (g/dL)    HCT 31.0 (*) 36.0 - 46.0 (%)   POCT I-STAT  TROPONIN I     Status: Normal   Collection Time   03/25/11  4:40 PM      Component Value Range Comment   Troponin i, poc 0.02  0.00 - 0.08 (ng/mL)    Comment 3            HEMOGLOBIN A1C     Status: Abnormal   Collection Time   03/25/11  6:42 PM      Component Value Range Comment   Hemoglobin A1C 6.7 (*) <5.7 (%)    Mean Plasma Glucose 146 (*) <117 (mg/dL)   CARDIAC PANEL(CRET KIN+CKTOT+MB+TROPI)     Status: Abnormal   Collection Time   03/25/11  6:42 PM      Component Value Range Comment   Total CK 193 (*) 7 - 177 (U/L)    CK, MB 3.9  0.3 - 4.0 (ng/mL)    Troponin I <0.30  <0.30 (ng/mL)    Relative Index 2.0  0.0 - 2.5    TSH     Status: Normal   Collection Time   03/25/11  6:42 PM      Component Value Range Comment   TSH 1.185  0.350 - 4.500 (uIU/mL)   GLUCOSE, CAPILLARY     Status: Abnormal   Collection Time   03/25/11  9:21 PM      Component Value Range Comment   Glucose-Capillary 136 (*) 70 - 99 (mg/dL)   PROTIME-INR     Status: Normal   Collection Time   03/25/11 10:39 PM      Component Value Range Comment   Prothrombin Time 13.9  11.6 - 15.2 (seconds)    INR 1.05  0.00 - 1.49    APTT     Status: Abnormal   Collection Time   03/25/11 10:39 PM      Component Value Range Comment   aPTT 21 (*) 24 - 37 (seconds)   CBC     Status: Abnormal   Collection Time   03/25/11 10:39 PM      Component Value Range Comment   WBC 13.2 (*) 4.0 - 10.5 (K/uL)    RBC 4.17  3.87 - 5.11 (MIL/uL)    Hemoglobin 10.1 (*) 12.0 - 15.0 (g/dL)    HCT 30.9 (*) 36.0 - 46.0 (%)    MCV 74.1 (*) 78.0 - 100.0 (fL)    MCH 24.2 (*) 26.0 - 34.0 (pg)    MCHC 32.7  30.0 - 36.0 (g/dL)    RDW 15.4  11.5 - 15.5 (%)    Platelets 276  150 - 400 (K/uL)   DIFFERENTIAL     Status: Abnormal   Collection Time   03/25/11 10:39 PM      Component Value Range Comment   Neutrophils Relative 59  43 - 77 (%)    Neutro Abs 7.8 (*) 1.7 - 7.7 (K/uL)    Lymphocytes Relative 25  12 - 46 (%)    Lymphs Abs 3.3  0.7 - 4.0  (K/uL)    Monocytes Relative 9  3 - 12 (%)    Monocytes Absolute 1.2 (*) 0.1 - 1.0 (K/uL)    Eosinophils Relative 6 (*) 0 - 5 (%)    Eosinophils Absolute 0.8 (*) 0.0 - 0.7 (K/uL)    Basophils Relative 0  0 - 1 (%)    Basophils Absolute 0.1  0.0 - 0.1 (K/uL)   BASIC METABOLIC PANEL     Status: Abnormal   Collection Time   03/25/11 10:39 PM      Component Value Range Comment   Sodium 137  135 - 145 (mEq/L)    Potassium 4.7  3.5 - 5.1 (mEq/L)    Chloride 107  96 - 112 (mEq/L)    CO2 21  19 - 32 (mEq/L)    Glucose, Bld 137 (*) 70 - 99 (mg/dL)    BUN 54 (*) 6 - 23 (mg/dL)    Creatinine, Ser 2.71 (*) 0.50 - 1.10 (mg/dL)    Calcium 9.3  8.4 - 10.5 (mg/dL)    GFR calc non Af Amer 17 (*) >90 (mL/min)    GFR calc Af Amer 20 (*) >90 (mL/min)   CARDIAC PANEL(CRET KIN+CKTOT+MB+TROPI)     Status: Abnormal   Collection Time   03/25/11 10:39 PM      Component Value Range Comment   Total CK 196 (*) 7 - 177 (U/L)    CK, MB 4.0  0.3 - 4.0 (ng/mL)  Troponin I <0.30  <0.30 (ng/mL)    Relative Index 2.0  0.0 - 2.5    CARDIAC PANEL(CRET KIN+CKTOT+MB+TROPI)     Status: Abnormal   Collection Time   03/26/11  5:40 AM      Component Value Range Comment   Total CK 190 (*) 7 - 177 (U/L)    CK, MB 3.5  0.3 - 4.0 (ng/mL)    Troponin I <0.30  <0.30 (ng/mL)    Relative Index 1.8  0.0 - 2.5    GLUCOSE, CAPILLARY     Status: Abnormal   Collection Time   03/26/11  7:27 AM      Component Value Range Comment   Glucose-Capillary 134 (*) 70 - 99 (mg/dL)     Dg Chest Portable 1 View  03/25/2011  *RADIOLOGY REPORT*  Clinical Data: Left-sided chest pain. Shortness of breath.  PORTABLE CHEST - 1 VIEW  Comparison: 10/06/2010.  Findings: Cardiomegaly.  Central pulmonary vascular prominence.  No gross pneumothorax or segmental consolidation.  Tortuous calcified aorta.  The patient would eventually benefit from follow-up two-view chest with cardiac leads removed.  IMPRESSION: Cardiomegaly.  Central pulmonary vascular  prominence.  Calcified tortuous aorta.  Original Report Authenticated By: Doug Sou, M.D.    Assessment/Plan Patient Active Hospital Problem List: Chest pain: The pain is somewhat atypical. Enzymes negative and now pain free. DC today and outpatient myoview; fu Dr. Acie Fredrickson. Would have high threshold for cath given severity of renal insufficiency. Diabetes: I will continue current meds.  Obesity: The patient understands the need to lose weight with diet and exercise. We have discussed specific strategies for this.  CKD: F/U nephrology after DC as scheduled Cardiomyopathy: Her EF is mildly reduced. I will continue the meds as listed. Microcytic anemia - fu with her primary care; may need GI eval  >30 min PA and physician time D2  Kirk Ruths MD 03/26/2011, 7:50 AM

## 2011-03-31 ENCOUNTER — Other Ambulatory Visit (HOSPITAL_COMMUNITY): Payer: Self-pay | Admitting: Radiology

## 2011-03-31 DIAGNOSIS — R079 Chest pain, unspecified: Secondary | ICD-10-CM

## 2011-03-31 DIAGNOSIS — I2510 Atherosclerotic heart disease of native coronary artery without angina pectoris: Secondary | ICD-10-CM

## 2011-04-05 ENCOUNTER — Encounter (HOSPITAL_COMMUNITY): Payer: Medicare Other | Admitting: Radiology

## 2011-04-05 ENCOUNTER — Telehealth: Payer: Self-pay | Admitting: *Deleted

## 2011-04-05 NOTE — Telephone Encounter (Signed)
msg left re: cancelled stress test and need to reschedule, Kennyth Lose rn from nuc testing informed me of the cancellation and pt c/o edema and cold like symptoms. I called and left msg to see PCP or call back if needs further assistance.

## 2011-04-14 ENCOUNTER — Encounter: Payer: Self-pay | Admitting: Cardiovascular Disease

## 2011-04-14 ENCOUNTER — Ambulatory Visit (INDEPENDENT_AMBULATORY_CARE_PROVIDER_SITE_OTHER): Payer: Medicare Other | Admitting: Cardiovascular Disease

## 2011-04-14 VITALS — BP 154/75 | HR 86 | Ht 66.0 in | Wt 256.0 lb

## 2011-04-14 DIAGNOSIS — I2510 Atherosclerotic heart disease of native coronary artery without angina pectoris: Secondary | ICD-10-CM

## 2011-04-14 DIAGNOSIS — I509 Heart failure, unspecified: Secondary | ICD-10-CM

## 2011-04-14 NOTE — Progress Notes (Signed)
Alexis [name] Date of Birth  17-Nov-1945  [facility] [address]    [place], [place]  [address] [phone]  Fax  [phone]  History of Present Illness:  Alexis [name] is a 65 year old female. She has a history of coronary artery disease. She has a stent placed in [place]  around 2005.  She's done well since that time. She's not had any episodes of chest pain. She did have some anxiety associated with some stress from her brother being in the hospital.  She was hospitalized for 2 days because of this chest pain. She ruled out for myocardial infarction. She was scheduled to have an outpatient stress test but she could not do it because of pulmonary congestion and leg pain.  Said she slept the hospital she's not had any recurrent episodes of chest pain. She thinks that it was all just due to stress. She's not having any episodes of chest pain now.  He also has a history of renal insufficiency. She sees Dr. Edrick Oh. She was recently started on amlodipine for hypertension.  Her last creatinine was 2.71.   Current Outpatient Prescriptions on File Prior to Visit  Medication Sig Dispense Refill  . amLODipine (NORVASC) 10 MG tablet Take 10 mg by mouth daily.        Marland Kitchen aspirin 325 MG tablet Take 325 mg by mouth daily.        Marland Kitchen aspirin EC 81 MG EC tablet Take 1 tablet (81 mg total) by mouth daily.  100 tablet  0  . ferrous gluconate (FERGON) 325 MG tablet Take 1 tablet (325 mg total) by mouth daily with breakfast.  30 tablet  10  . furosemide (LASIX) 40 MG tablet Take 1 tablet (40 mg total) by mouth 2 (two) times daily.  60 tablet  10  . insulin aspart protamine-insulin aspart (NOVOLOG 70/30) (70-30) 100 UNIT/ML injection Inject 30-80 Units into the skin 2 (two) times daily with a meal. Inject 30 units in the evening and 80 units in the morning       . metoprolol (LOPRESSOR) 50 MG tablet Take 50 mg by mouth 2 (two) times daily.        . nitroGLYCERIN (NITROSTAT) 0.4 MG SL  tablet Place 1 tablet (0.4 mg total) under the tongue every 5 (five) minutes as needed for chest pain.  50 tablet  4  . olmesartan (BENICAR) 20 MG tablet Take 20 mg by mouth daily.          No Known Allergies  Past Medical History  Diagnosis Date  . Diabetes mellitus   . Hypertension   . Morbid obesity   . Coronary artery disease   . Chest pain   . 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 Hyperlipidemia   . Hx of breast cancer   . CHF (congestive heart failure)   . SOB (shortness of breath)   . Chronic kidney disease     Past Surgical History  Procedure Date  . Back surgery   . Tubal ligation   . Transthoracic echocardiogram 10/01/2010     Left ventricle: The cavity size was mildly dilated. Wall thickness was increased in a pattern of mild LVH. Systolic function was   mildly reduced. The estimated ejection fraction was in the range  of 45% to 50%.   . Coronary angioplasty 12/19/2003    inferior wall hypokinesis. ef 50%  . Arm surgery     Left arm trauma  .  Mastectomy     Left    History  Smoking status  . Former Smoker -- 1.0 packs/day for 30 years  . Types: Cigarettes  . Quit date: 05/16/2002  Smokeless tobacco  . Never Used    History  Alcohol Use No    Family History  Problem Relation Age of Onset  . Breast cancer Mother     Died age 65    Reviw of Systems:  Reviewed in the HPI.  All other systems are negative.  Physical Exam: BP 154/75  Pulse 86  Ht 5\' 6"  (1.676 m)  Wt 256 lb (116.121 kg)  BMI 41.32 kg/m2 The patient is alert and oriented x 3.  The mood and affect are normal.   Skin: warm and dry.  Color is normal.    HEENT:   Normocephalic/atraumatic. The mucous membranes are moist. There is no JVD.  Lungs: Her lung exam is clear.   Heart: Regular rate S1-S2    Abdomen: Good bowel sounds. There is no hepatosplenomegaly.  She is obese.  Extremities:  She is no clubbing cyanosis or edema  Neuro:  There exam is nonfocal    ECG:   Assessment / Plan:

## 2011-04-14 NOTE — Assessment & Plan Note (Signed)
She has a history of chronic systolic congestive heart failure. She also has a history of chronic kidney disease. She seems to do fairly well as long as we regulate her volume.  She has no signs of congestive heart failure today.

## 2011-04-14 NOTE — Patient Instructions (Signed)
Your physician wants you to follow-up in: 6 MONTHS OR SOONER IF NEEDED You will receive a reminder letter in the mail two months in advance. If you don't receive a letter, please call our office to schedule the follow-up appointment.  Your physician recommends that you return for a FASTING lipid profile: 6 MONTHS

## 2011-04-14 NOTE — Assessment & Plan Note (Signed)
She's not having any further episodes of chest pain. I suspect that her chest pains several weeks ago would do to stress involving her brothers illness. She's not having any angina now.  I've encouraged her to get back into her exercise program. I'll see her again in 6 months for followup visit.

## 2011-09-21 ENCOUNTER — Emergency Department (HOSPITAL_COMMUNITY)
Admission: EM | Admit: 2011-09-21 | Discharge: 2011-09-21 | Disposition: A | Discharge status: Home / Self Care | Payer: Medicare Other | Attending: Emergency Medicine | Admitting: Emergency Medicine

## 2011-09-21 DIAGNOSIS — N189 Chronic kidney disease, unspecified: Secondary | ICD-10-CM | POA: Insufficient documentation

## 2011-09-21 DIAGNOSIS — N289 Disorder of kidney and ureter, unspecified: Secondary | ICD-10-CM | POA: Insufficient documentation

## 2011-09-21 DIAGNOSIS — E785 Hyperlipidemia, unspecified: Secondary | ICD-10-CM | POA: Insufficient documentation

## 2011-09-21 DIAGNOSIS — E119 Type 2 diabetes mellitus without complications: Secondary | ICD-10-CM | POA: Insufficient documentation

## 2011-09-21 DIAGNOSIS — I129 Hypertensive chronic kidney disease with stage 1 through stage 4 chronic kidney disease, or unspecified chronic kidney disease: Secondary | ICD-10-CM | POA: Insufficient documentation

## 2011-09-21 DIAGNOSIS — Z853 Personal history of malignant neoplasm of breast: Secondary | ICD-10-CM | POA: Insufficient documentation

## 2011-09-21 DIAGNOSIS — N390 Urinary tract infection, site not specified: Secondary | ICD-10-CM | POA: Insufficient documentation

## 2011-09-21 DIAGNOSIS — Z7982 Long term (current) use of aspirin: Secondary | ICD-10-CM | POA: Insufficient documentation

## 2011-09-21 DIAGNOSIS — Z79899 Other long term (current) drug therapy: Secondary | ICD-10-CM | POA: Insufficient documentation

## 2011-09-21 DIAGNOSIS — I252 Old myocardial infarction: Secondary | ICD-10-CM | POA: Insufficient documentation

## 2011-09-21 DIAGNOSIS — I2510 Atherosclerotic heart disease of native coronary artery without angina pectoris: Secondary | ICD-10-CM | POA: Insufficient documentation

## 2011-09-21 LAB — BASIC METABOLIC PANEL
CO2: 19 mEq/L (ref 19–32)
Calcium: 8.6 mg/dL (ref 8.4–10.5)
Chloride: 105 mEq/L (ref 96–112)
Sodium: 135 mEq/L (ref 135–145)

## 2011-09-21 LAB — DIFFERENTIAL
Eosinophils Relative: 6 % — ABNORMAL HIGH (ref 0–5)
Lymphocytes Relative: 28 % (ref 12–46)
Monocytes Absolute: 0.7 10*3/uL (ref 0.1–1.0)
Monocytes Relative: 7 % (ref 3–12)
Neutrophils Relative %: 59 % (ref 43–77)

## 2011-09-21 LAB — URINALYSIS, ROUTINE W REFLEX MICROSCOPIC
Leukocytes, UA: NEGATIVE
Nitrite: NEGATIVE
Specific Gravity, Urine: 1.025 (ref 1.005–1.030)
Urobilinogen, UA: 0.2 mg/dL (ref 0.0–1.0)

## 2011-09-21 LAB — CBC
Platelets: 242 10*3/uL (ref 150–400)
RBC: 4.97 MIL/uL (ref 3.87–5.11)
WBC: 10.2 10*3/uL (ref 4.0–10.5)

## 2011-09-21 LAB — URINE MICROSCOPIC-ADD ON

## 2011-09-21 MED ORDER — HYDROCODONE-ACETAMINOPHEN 5-500 MG PO TABS: 1.0000 | ORAL_TABLET | Freq: Four times a day (QID) | ORAL | Status: AC | PRN

## 2011-09-21 MED ORDER — SULFAMETHOXAZOLE-TRIMETHOPRIM 800-160 MG PO TABS: 1.0000 | ORAL_TABLET | Freq: Two times a day (BID) | ORAL | Status: AC

## 2011-09-21 MED ORDER — OXYCODONE-ACETAMINOPHEN 5-325 MG PO TABS
2.0000 | ORAL_TABLET | Freq: Once | ORAL | Status: AC
  Administered 2011-09-21: 2 via ORAL
  Filled 2011-09-21 (×2): qty 1

## 2011-09-21 MED ORDER — SULFAMETHOXAZOLE-TMP DS 800-160 MG PO TABS
1.0000 | ORAL_TABLET | Freq: Once | ORAL | Status: AC
  Administered 2011-09-21: 1 via ORAL
  Filled 2011-09-21: qty 1

## 2011-09-21 NOTE — ED Provider Notes (Signed)
Medical screening examination/treatment/procedure(s) were performed by non-physician practitioner and as supervising physician I was immediately available for consultation/collaboration.   Hoy Morn, MD 09/21/11 1710

## 2011-09-21 NOTE — Discharge Instructions (Signed)
It is VERY important to take antibiotic in complete course and stay well hydrated with 8-10 glasses of water a day. Use hydrocodone-acetaminophen as needed for pain but do not drive or operate machinery with use. Follow up with Dr. Jeanie Cooks on Monday to recheck your urine and urine kidney function as well as your diabetes management but return to ER for any emergent changing or worsening of symptoms.   Urinary Tract Infection Infections of the urinary tract can start in several places. A bladder infection (cystitis), a kidney infection (pyelonephritis), and a prostate infection (prostatitis) are different types of urinary tract infections (UTIs). They usually get better if treated with medicines (antibiotics) that kill germs. Take all the medicine until it is gone. You or your child may feel better in a few days, but TAKE ALL MEDICINE or the infection may not respond and may become more difficult to treat. HOME CARE INSTRUCTIONS   Drink enough water and fluids to keep the urine clear or pale yellow. Cranberry juice is especially recommended, in addition to large amounts of water.   Avoid caffeine, tea, and carbonated beverages. They tend to irritate the bladder.   Alcohol may irritate the prostate.   Only take over-the-counter or prescription medicines for pain, discomfort, or fever as directed by your caregiver.  To prevent further infections:  Empty the bladder often. Avoid holding urine for long periods of time.   After a bowel movement, women should cleanse from front to back. Use each tissue only once.   Empty the bladder before and after sexual intercourse.  FINDING OUT THE RESULTS OF YOUR TEST Not all test results are available during your visit. If your or your child's test results are not back during the visit, make an appointment with your caregiver to find out the results. Do not assume everything is normal if you have not heard from your caregiver or the medical facility. It is  important for you to follow up on all test results. SEEK MEDICAL CARE IF:   There is back pain.   Your baby is older than 3 months with a rectal temperature of 100.5 F (38.1 C) or higher for more than 1 day.   Your or your child's problems (symptoms) are no better in 3 days. Return sooner if you or your child is getting worse.  SEEK IMMEDIATE MEDICAL CARE IF:   There is severe back pain or lower abdominal pain.   You or your child develops chills.   You have a fever.   Your baby is older than 3 months with a rectal temperature of 102 F (38.9 C) or higher.   Your baby is 56 months old or younger with a rectal temperature of 100.4 F (38 C) or higher.   There is nausea or vomiting.   There is continued burning or discomfort with urination.  MAKE SURE YOU:   Understand these instructions.   Will watch your condition.   Will get help right away if you are not doing well or get worse.  Document Released: 02/09/2005 Document Revised: 04/21/2011 Document Reviewed: 09/14/2006 [facility] Patient Information 2012 [place].

## 2011-09-21 NOTE — ED Provider Notes (Signed)
History     CSN: [id number]  Arrival date & time 09/21/11  1430   First MD Initiated Contact with Patient 09/21/11 1507      No chief complaint on file.   (Consider location/radiation/quality/duration/timing/severity/associated sxs/prior treatment) HPI  Patient presents to ER complaining of a 2 day hx of gradual onset right lower groin/suprapubic pain that began yesterday and has gradually worsening throughout the day. Patient is complaining of increased urinary frequency but denies dysuria, hematuria, flank pain, n/v or fevers. Patient states pain is constant but aggravated by movement. She denies radiation of pain. She has taken nothing for pain PTA. She denies swelling or mass of groin.   Past Medical History  Diagnosis Date  . Diabetes mellitus   . Hypertension   . Morbid obesity   . Coronary artery disease   . Chest pain   . 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 Hyperlipidemia   . Hx of breast cancer   . CHF (congestive heart failure)   . SOB (shortness of breath)   . Chronic kidney disease     Past Surgical History  Procedure Date  . Back surgery   . Tubal ligation   . Transthoracic echocardiogram 10/01/2010     Left ventricle: The cavity size was mildly dilated. Wall thickness was increased in a pattern of mild LVH. Systolic function was   mildly reduced. The estimated ejection fraction was in the range  of 45% to 50%.   . Coronary angioplasty 12/19/2003    inferior wall hypokinesis. ef 50%  . Arm surgery     Left arm trauma  . Mastectomy     Left    Family History  Problem Relation Age of Onset  . Breast cancer Mother     Died age 70    History  Substance Use Topics  . Smoking status: Former Smoker -- 1.0 packs/day for 30 years    Types: Cigarettes    Quit date: 05/16/2002  . Smokeless tobacco: Never Used  . Alcohol Use: No    OB History    Grav Para Term Preterm Abortions TAB SAB Ect Mult Living                  Review of Systems  All other systems  reviewed and are negative.    Allergies  Review of patient's allergies indicates no known allergies.  Home Medications   Current Outpatient Rx  Name Route Sig Dispense Refill  . AMLODIPINE BESYLATE 10 MG PO TABS Oral Take 10 mg by mouth daily.      . ASPIRIN 325 MG PO TABS Oral Take 325 mg by mouth daily.      . CYCLOBENZAPRINE HCL 5 MG PO TABS Oral Take 5 mg by mouth 3 (three) times daily as needed. For muscle spasms.    . FUROSEMIDE 40 MG PO TABS Oral Take 40 mg by mouth daily.    . INSULIN ASPART PROT & ASPART (70-30) 100 UNIT/ML [place] SUSP Subcutaneous Inject 30-80 Units into the skin See admin instructions. She takes 65 units in the morning and 30 units at bedtime.    Marland Kitchen LORATADINE 10 MG PO TABS Oral Take 10 mg by mouth daily as needed. For allergies.    Marland Kitchen METOPROLOL TARTRATE 50 MG PO TABS Oral Take 50 mg by mouth 2 (two) times daily.      Marland Kitchen NITROGLYCERIN 0.4 MG SL SUBL Sublingual Place 0.4 mg under the tongue every 5 (five) minutes  as needed. For chest pain.    Marland Kitchen OLMESARTAN MEDOXOMIL 20 MG PO TABS Oral Take 20 mg by mouth every morning.     Marland Kitchen HYDROCODONE-ACETAMINOPHEN 5-500 MG PO TABS Oral Take 1-2 tablets by mouth every 6 (six) hours as needed for pain. 15 tablet 0  . SULFAMETHOXAZOLE-TRIMETHOPRIM 800-160 MG PO TABS Oral Take 1 tablet by mouth every 12 (twelve) hours. 10 tablet 0    BP 157/79  Pulse 86  Temp(Src) 98.7 F (37.1 C) (Oral)  Resp 20  SpO2 100%  Physical Exam  Nursing note and vitals reviewed. Constitutional: She is oriented to person, place, and time. She appears well-developed and well-nourished. No distress.       obese  HENT:  Head: Normocephalic and atraumatic.  Eyes: Conjunctivae are normal.  Neck: Normal range of motion. Neck supple.  Cardiovascular: Normal rate, regular rhythm, normal heart sounds and intact distal pulses.  Exam reveals no gallop and no friction rub.   No murmur heard. Pulmonary/Chest: Effort normal and breath sounds normal. No  respiratory distress. She has no wheezes. She has no rales. She exhibits no tenderness.  Abdominal: Bowel sounds are normal. She exhibits no distension and no mass. There is tenderness. There is no rebound and no guarding.       Mild TTP of right inguinal and suprapubic region with remainder of abdomen non tender. No CVA TTP. No rigidity or peritoneal signs.   Musculoskeletal: Normal range of motion. She exhibits no edema and no tenderness.  Neurological: She is alert and oriented to person, place, and time.  Skin: Skin is warm and dry. No rash noted. She is not diaphoretic. No erythema.  Psychiatric: She has a normal mood and affect.    ED Course  Procedures (including critical care time)  Labs Reviewed  URINALYSIS, ROUTINE W REFLEX MICROSCOPIC - Abnormal; Notable for the following:    APPearance CLOUDY (*)    Glucose, UA 250 (*)    Hgb urine dipstick MODERATE (*)    Protein, ur >300 (*)    All other components within normal limits  CBC - Abnormal; Notable for the following:    Hemoglobin 11.7 (*)    HCT 34.7 (*)    MCV 69.8 (*)    MCH 23.5 (*)    RDW 16.0 (*)    All other components within normal limits  BASIC METABOLIC PANEL - Abnormal; Notable for the following:    Glucose, Bld 133 (*)    BUN 30 (*)    Creatinine, Ser 2.10 (*)    GFR calc non Af Amer 24 (*)    GFR calc Af Amer 27 (*)    All other components within normal limits  URINE MICROSCOPIC-ADD ON - Abnormal; Notable for the following:    Squamous Epithelial / LPF MANY (*)    Bacteria, UA MANY (*)    All other components within normal limits  DIFFERENTIAL   No results found.   1. Urinary tract infection   2. Diabetes mellitus   3. Renal insufficiency       MDM  Patient is afebrile and non toxic appearing. Non acute abdomen with TTP of right groin and suprapubic region but no signs or symptoms of pyelonephritis. Patient voices understanding of need for close follow up with PCP on Monday to recheck urine and  recheck her baseline renal insufficiency but to return to ER for changing or worsening of symptoms.         Eben Burow, PA  09/21/11 1704 

## 2012-01-21 ENCOUNTER — Emergency Department (HOSPITAL_COMMUNITY): Payer: Medicare Other

## 2012-01-21 ENCOUNTER — Encounter (HOSPITAL_COMMUNITY): Payer: Self-pay | Admitting: Emergency Medicine

## 2012-01-21 ENCOUNTER — Emergency Department (HOSPITAL_COMMUNITY)
Admission: EM | Admit: 2012-01-21 | Discharge: 2012-01-21 | Disposition: A | Discharge status: Home / Self Care | Payer: Medicare Other | Attending: Emergency Medicine | Admitting: Emergency Medicine

## 2012-01-21 DIAGNOSIS — R1110 Vomiting, unspecified: Secondary | ICD-10-CM | POA: Insufficient documentation

## 2012-01-21 DIAGNOSIS — I252 Old myocardial infarction: Secondary | ICD-10-CM | POA: Insufficient documentation

## 2012-01-21 DIAGNOSIS — J45901 Unspecified asthma with (acute) exacerbation: Secondary | ICD-10-CM | POA: Insufficient documentation

## 2012-01-21 DIAGNOSIS — Z79899 Other long term (current) drug therapy: Secondary | ICD-10-CM | POA: Insufficient documentation

## 2012-01-21 DIAGNOSIS — I129 Hypertensive chronic kidney disease with stage 1 through stage 4 chronic kidney disease, or unspecified chronic kidney disease: Secondary | ICD-10-CM | POA: Insufficient documentation

## 2012-01-21 DIAGNOSIS — I2510 Atherosclerotic heart disease of native coronary artery without angina pectoris: Secondary | ICD-10-CM | POA: Insufficient documentation

## 2012-01-21 DIAGNOSIS — N189 Chronic kidney disease, unspecified: Secondary | ICD-10-CM | POA: Insufficient documentation

## 2012-01-21 DIAGNOSIS — B9789 Other viral agents as the cause of diseases classified elsewhere: Secondary | ICD-10-CM | POA: Insufficient documentation

## 2012-01-21 DIAGNOSIS — R197 Diarrhea, unspecified: Secondary | ICD-10-CM | POA: Insufficient documentation

## 2012-01-21 DIAGNOSIS — Z853 Personal history of malignant neoplasm of breast: Secondary | ICD-10-CM | POA: Insufficient documentation

## 2012-01-21 DIAGNOSIS — R05 Cough: Secondary | ICD-10-CM | POA: Insufficient documentation

## 2012-01-21 DIAGNOSIS — E785 Hyperlipidemia, unspecified: Secondary | ICD-10-CM | POA: Insufficient documentation

## 2012-01-21 DIAGNOSIS — E119 Type 2 diabetes mellitus without complications: Secondary | ICD-10-CM | POA: Insufficient documentation

## 2012-01-21 DIAGNOSIS — R0602 Shortness of breath: Secondary | ICD-10-CM | POA: Insufficient documentation

## 2012-01-21 DIAGNOSIS — B349 Viral infection, unspecified: Secondary | ICD-10-CM

## 2012-01-21 DIAGNOSIS — I509 Heart failure, unspecified: Secondary | ICD-10-CM | POA: Insufficient documentation

## 2012-01-21 DIAGNOSIS — R059 Cough, unspecified: Secondary | ICD-10-CM | POA: Insufficient documentation

## 2012-01-21 HISTORY — DX: Malignant (primary) neoplasm, unspecified: C80.1

## 2012-01-21 LAB — COMPREHENSIVE METABOLIC PANEL
Albumin: 3.2 g/dL — ABNORMAL LOW (ref 3.5–5.2)
BUN: 34 mg/dL — ABNORMAL HIGH (ref 6–23)
Chloride: 105 mEq/L (ref 96–112)
Creatinine, Ser: 2.71 mg/dL — ABNORMAL HIGH (ref 0.50–1.10)
GFR calc Af Amer: 20 mL/min — ABNORMAL LOW (ref >90)
Glucose, Bld: 125 mg/dL — ABNORMAL HIGH (ref 70–99)
Total Bilirubin: 0.4 mg/dL (ref 0.3–1.2)
Total Protein: 7.2 g/dL (ref 6.0–8.3)

## 2012-01-21 LAB — CBC WITH DIFFERENTIAL/PLATELET
Basophils Absolute: 0 10*3/uL (ref 0.0–0.1)
Lymphs Abs: 1.7 10*3/uL (ref 0.7–4.0)
MCH: 23.8 pg — ABNORMAL LOW (ref 26.0–34.0)
MCV: 70.8 fL — ABNORMAL LOW (ref 78.0–100.0)
Monocytes Absolute: 1.2 10*3/uL — ABNORMAL HIGH (ref 0.1–1.0)
Neutro Abs: 8.1 10*3/uL — ABNORMAL HIGH (ref 1.7–7.7)
Platelets: 182 10*3/uL (ref 150–400)
RDW: 15.8 % — ABNORMAL HIGH (ref 11.5–15.5)

## 2012-01-21 LAB — LIPASE, BLOOD: Lipase: 18 U/L (ref 11–59)

## 2012-01-21 MED ORDER — METHYLPREDNISOLONE SODIUM SUCC 125 MG IJ SOLR
125.0000 mg | Freq: Once | INTRAMUSCULAR | Status: DC
  Filled 2012-01-21: qty 2

## 2012-01-21 MED ORDER — PREDNISONE 20 MG PO TABS: ORAL_TABLET | ORAL | Status: AC

## 2012-01-21 MED ORDER — SODIUM CHLORIDE 0.9 % IV BOLUS (SEPSIS): 500.0000 mL | Freq: Once | INTRAVENOUS | Status: DC

## 2012-01-21 MED ORDER — SODIUM CHLORIDE 0.9 % IV BOLUS (SEPSIS): 1000.0000 mL | Freq: Once | INTRAVENOUS | Status: DC

## 2012-01-21 MED ORDER — ONDANSETRON HCL 4 MG/2ML IJ SOLN
4.0000 mg | Freq: Once | INTRAMUSCULAR | Status: DC
  Filled 2012-01-21: qty 2

## 2012-01-21 MED ORDER — PREDNISONE 20 MG PO TABS
60.0000 mg | ORAL_TABLET | Freq: Once | ORAL | Status: AC
  Administered 2012-01-21: 60 mg via ORAL
  Filled 2012-01-21: qty 3

## 2012-01-21 MED ORDER — ALBUTEROL SULFATE (5 MG/ML) 0.5% IN NEBU
5.0000 mg | INHALATION_SOLUTION | RESPIRATORY_TRACT | Status: AC
  Administered 2012-01-21: 5 mg via RESPIRATORY_TRACT
  Filled 2012-01-21: qty 1

## 2012-01-21 MED ORDER — ALBUTEROL SULFATE HFA 108 (90 BASE) MCG/ACT IN AERS: 2.0000 | INHALATION_SPRAY | RESPIRATORY_TRACT | Status: DC | PRN

## 2012-01-21 MED ORDER — ONDANSETRON 4 MG PO TBDP
4.0000 mg | ORAL_TABLET | Freq: Once | ORAL | Status: AC
  Administered 2012-01-21: 4 mg via ORAL
  Filled 2012-01-21: qty 1

## 2012-01-21 MED ORDER — IPRATROPIUM BROMIDE 0.02 % IN SOLN
0.5000 mg | RESPIRATORY_TRACT | Status: AC
  Administered 2012-01-21: 0.5 mg via RESPIRATORY_TRACT
  Filled 2012-01-21: qty 2.5

## 2012-01-21 NOTE — ED Notes (Addendum)
IV team RN here to see pt for IV/phlebotomy. RT notified for pt's breathing treatments.

## 2012-01-21 NOTE — ED Notes (Signed)
Pt w/ hx of breast CA presents w/ nasal congestion, cough that is causing chest to hurt, vomiting and diarrhea on set 2 days ago.

## 2012-01-21 NOTE — ED Notes (Signed)
Pt states that she began to have "a cold two days ago, but this morning it is in my chest."

## 2012-01-21 NOTE — ED Notes (Signed)
IV team to put in IV and try to obtain labs at the same time.

## 2012-01-21 NOTE — ED Provider Notes (Signed)
History     CSN: [id number]  Arrival date & time 01/21/12  1147   First MD Initiated Contact with Patient 01/21/12 1227      Chief Complaint  Patient presents with  . Emesis  . Diarrhea    (Consider location/radiation/quality/duration/timing/severity/associated sxs/prior treatment) The history is provided by the patient.  Alexis [name] is a 66 y.o. female hx of CHF (EF 45%), DM, CKD, CAD s/p stent, breast Ca in remission here with myalgias, nausea, diarrhea since yesterday. She has chronic cough but yesterday she felt worse shortness of breath and wheezing. She also had 3 episodes of diarrhea. She also has diffuse myalgias and nasal congestion. She felt nauseous and vomited several times. No abdominal pain. + fevers at home.    Past Medical History  Diagnosis Date  . Diabetes mellitus   . Hypertension   . Morbid obesity   . Coronary artery disease   . Chest pain   . 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 Hyperlipidemia   . Hx of breast cancer   . CHF (congestive heart failure)   . SOB (shortness of breath)   . Chronic kidney disease   . Cancer 2005     left breast    Past Surgical History  Procedure Date  . Back surgery   . Tubal ligation   . Transthoracic echocardiogram 10/01/2010     Left ventricle: The cavity size was mildly dilated. Wall thickness was increased in a pattern of mild LVH. Systolic function was   mildly reduced. The estimated ejection fraction was in the range  of 45% to 50%.   . Coronary angioplasty 12/19/2003    inferior wall hypokinesis. ef 50%  . Arm surgery     Left arm trauma  . Mastectomy     Left    Family History  Problem Relation Age of Onset  . Breast cancer Mother     Died age 60    History  Substance Use Topics  . Smoking status: Former Smoker -- 1.0 packs/day for 30 years    Types: Cigarettes    Quit date: 05/16/2002  . Smokeless tobacco: Never Used  . Alcohol Use: No    OB History    Grav Para Term Preterm Abortions TAB SAB Ect  Mult Living                  Review of Systems  Constitutional: Positive for fever.  Respiratory: Positive for cough and shortness of breath.   Gastrointestinal: Positive for vomiting and diarrhea.  All other systems reviewed and are negative.    Allergies  Review of patient's allergies indicates no known allergies.  Home Medications   Current Outpatient Rx  Name Route Sig Dispense Refill  . AMLODIPINE BESYLATE 10 MG PO TABS Oral Take 10 mg by mouth daily.      . ASPIRIN 325 MG PO TABS Oral Take 325 mg by mouth daily.      . FUROSEMIDE 40 MG PO TABS Oral Take 40 mg by mouth daily.    . INSULIN ASPART PROT & ASPART (70-30) 100 UNIT/ML [place] SUSP Subcutaneous Inject 30-80 Units into the skin See admin instructions. She takes 65 units in the morning and 30 units at bedtime.    Marland Kitchen LORATADINE 10 MG PO TABS Oral Take 10 mg by mouth daily as needed. For allergies.    Marland Kitchen METOPROLOL TARTRATE 50 MG PO TABS Oral Take 50 mg by mouth 2 (two) times  daily.      Marland Kitchen NITROGLYCERIN 0.4 MG SL SUBL Sublingual Place 0.4 mg under the tongue every 5 (five) minutes as needed. For chest pain.    Marland Kitchen OLMESARTAN MEDOXOMIL 20 MG PO TABS Oral Take 20 mg by mouth every morning.     Marland Kitchen POTASSIUM CHLORIDE ER 10 MEQ PO TBCR Oral Take 10 mEq by mouth daily.      BP 192/82  Pulse 103  Temp 99 F (37.2 C) (Oral)  Resp 17  SpO2 98%  Physical Exam  Nursing note and vitals reviewed. Constitutional: She is oriented to person, place, and time.       Anxious, uncomfortable   HENT:  Head: Normocephalic.       OP slightly dry  Eyes: Conjunctivae are normal. Pupils are equal, round, and reactive to light.  Neck: Normal range of motion. Neck supple.  Cardiovascular: Normal rate, regular rhythm and normal heart sounds.   Pulmonary/Chest:       Increased effort, diffuse wheezing.   Abdominal: Soft. Bowel sounds are normal.  Musculoskeletal:       R leg swollen (per patient chronic)   Neurological: She is alert and  oriented to person, place, and time.  Skin: Skin is warm and dry.  Psychiatric: She has a normal mood and affect. Her behavior is normal. Judgment and thought content normal.    ED Course  Procedures (including critical care time)  Labs Reviewed  CBC WITH DIFFERENTIAL - Abnormal; Notable for the following:    WBC 11.5 (*)  WHITE COUNT CONFIRMED ON SMEAR   Hemoglobin 11.1 (*)     HCT 33.0 (*)     MCV 70.8 (*)     MCH 23.8 (*)     RDW 15.8 (*)     Neutro Abs 8.1 (*)     Monocytes Absolute 1.2 (*)     All other components within normal limits  COMPREHENSIVE METABOLIC PANEL - Abnormal; Notable for the following:    CO2 18 (*)     Glucose, Bld 125 (*)     BUN 34 (*)     Creatinine, Ser 2.71 (*)     Albumin 3.2 (*)     GFR calc non Af Amer 17 (*)     GFR calc Af Amer 20 (*)     All other components within normal limits  LIPASE, BLOOD   Dg Chest 2 View  01/21/2012  *RADIOLOGY REPORT*  Clinical Data: Emesis and diarrhea  CHEST - 2 VIEW  Comparison: Chest radiograph 03/25/2011 and chest CT 10/02/2010  Findings: Left mastectomy and left axillary nodal dissection. Stable cardiomegaly.  Lungs are clear.  Negative for pleural effusion or pneumothorax.  Visualized bowel gas pattern is nonobstructive.  Degenerative changes of both shoulders.  IMPRESSION:  1.  Cardiomegaly.  No acute findings in the chest. 2.  Left mastectomy and left axillary nodal dissection.   Original Report Authenticated By: Curlene Dolphin, M.D.      1. Asthma exacerbation   2. Viral syndrome      Date: 01/21/2012  Rate: 95  Rhythm: normal sinus rhythm and premature atrial contractions (PAC)  QRS Axis: normal  Intervals: normal  ST/T Wave abnormalities: TWI laterally  Conduction Disutrbances:none  Narrative Interpretation: + LVH  Old EKG Reviewed: unchanged     MDM  Alexis [name] is a 66 y.o. female hx of CHF, breast Ca in remission, DM here with congestion, cough, vomiting, and diarrhea. She is likely to have  viral syndrome and asthma exacerbation. Will give albuterol/atrovent, IV steroids. Will also check electrolytes given that she has vomiting and diarrhea and will hydrate her gently. Will reassess.   4:05 PM She felt better after albuterol and steroids. Unable to get IV but she is able to tolerate fluids. Her wheezing resolved on lung exam. CBC, CMP, lipase, cxr nl. Will d/c home on albuterol and prednisone. She likely has asthma exacerbation from viral syndrome.     Wandra Arthurs, MD 01/21/12 [phone]

## 2012-01-21 NOTE — ED Notes (Signed)
Phlebotomy unable to get blood for labs. RN unable to start IV x2 attempt. Pt states staff generally has to call IV team. IV team notified as well as ED MD. Pt willing to take meds by IM route.

## 2012-04-19 ENCOUNTER — Encounter (HOSPITAL_COMMUNITY): Payer: Self-pay | Admitting: *Deleted

## 2012-04-19 ENCOUNTER — Emergency Department (HOSPITAL_COMMUNITY)
Admission: EM | Admit: 2012-04-19 | Discharge: 2012-04-20 | Disposition: A | Discharge status: Home / Self Care | Payer: Medicare Other | Attending: Emergency Medicine | Admitting: Emergency Medicine

## 2012-04-19 DIAGNOSIS — R600 Localized edema: Secondary | ICD-10-CM

## 2012-04-19 DIAGNOSIS — E785 Hyperlipidemia, unspecified: Secondary | ICD-10-CM | POA: Insufficient documentation

## 2012-04-19 DIAGNOSIS — I129 Hypertensive chronic kidney disease with stage 1 through stage 4 chronic kidney disease, or unspecified chronic kidney disease: Secondary | ICD-10-CM | POA: Insufficient documentation

## 2012-04-19 DIAGNOSIS — Z8709 Personal history of other diseases of the respiratory system: Secondary | ICD-10-CM | POA: Insufficient documentation

## 2012-04-19 DIAGNOSIS — K6289 Other specified diseases of anus and rectum: Secondary | ICD-10-CM | POA: Insufficient documentation

## 2012-04-19 DIAGNOSIS — Z79899 Other long term (current) drug therapy: Secondary | ICD-10-CM | POA: Insufficient documentation

## 2012-04-19 DIAGNOSIS — I2510 Atherosclerotic heart disease of native coronary artery without angina pectoris: Secondary | ICD-10-CM | POA: Insufficient documentation

## 2012-04-19 DIAGNOSIS — N189 Chronic kidney disease, unspecified: Secondary | ICD-10-CM | POA: Insufficient documentation

## 2012-04-19 DIAGNOSIS — R609 Edema, unspecified: Secondary | ICD-10-CM | POA: Insufficient documentation

## 2012-04-19 DIAGNOSIS — Z853 Personal history of malignant neoplasm of breast: Secondary | ICD-10-CM | POA: Insufficient documentation

## 2012-04-19 DIAGNOSIS — E119 Type 2 diabetes mellitus without complications: Secondary | ICD-10-CM | POA: Insufficient documentation

## 2012-04-19 DIAGNOSIS — Z87891 Personal history of nicotine dependence: Secondary | ICD-10-CM | POA: Insufficient documentation

## 2012-04-19 DIAGNOSIS — I252 Old myocardial infarction: Secondary | ICD-10-CM | POA: Insufficient documentation

## 2012-04-19 DIAGNOSIS — I509 Heart failure, unspecified: Secondary | ICD-10-CM | POA: Insufficient documentation

## 2012-04-19 DIAGNOSIS — Z794 Long term (current) use of insulin: Secondary | ICD-10-CM | POA: Insufficient documentation

## 2012-04-19 DIAGNOSIS — Z7982 Long term (current) use of aspirin: Secondary | ICD-10-CM | POA: Insufficient documentation

## 2012-04-19 NOTE — ED Notes (Signed)
Pt in c/o right lower leg swelling, MD told her to come in for futher eval for possible fluid build up. Pt is no distress in triage.

## 2012-04-20 ENCOUNTER — Emergency Department (HOSPITAL_COMMUNITY): Payer: Medicare Other

## 2012-04-20 DIAGNOSIS — M7989 Other specified soft tissue disorders: Secondary | ICD-10-CM

## 2012-04-20 LAB — CBC WITH DIFFERENTIAL/PLATELET
Basophils Absolute: 0.1 10*3/uL (ref 0.0–0.1)
Eosinophils Absolute: 0.4 10*3/uL (ref 0.0–0.7)
HCT: 33.8 % — ABNORMAL LOW (ref 36.0–46.0)
Lymphocytes Relative: 25 % (ref 12–46)
MCHC: 33.1 g/dL (ref 30.0–36.0)
Monocytes Relative: 10 % (ref 3–12)
Neutro Abs: 5.2 10*3/uL (ref 1.7–7.7)
Neutrophils Relative %: 59 % (ref 43–77)
RDW: 16.8 % — ABNORMAL HIGH (ref 11.5–15.5)
WBC: 8.8 10*3/uL (ref 4.0–10.5)

## 2012-04-20 LAB — COMPREHENSIVE METABOLIC PANEL
ALT: 21 U/L (ref 0–35)
AST: 20 U/L (ref 0–37)
Albumin: 2.9 g/dL — ABNORMAL LOW (ref 3.5–5.2)
Alkaline Phosphatase: 57 U/L (ref 39–117)
BUN: 57 mg/dL — ABNORMAL HIGH (ref 6–23)
Chloride: 107 mEq/L (ref 96–112)
Potassium: 4.7 mEq/L (ref 3.5–5.1)
Sodium: 136 mEq/L (ref 135–145)
Total Bilirubin: 0.3 mg/dL (ref 0.3–1.2)
Total Protein: 6.6 g/dL (ref 6.0–8.3)

## 2012-04-20 LAB — PRO B NATRIURETIC PEPTIDE: Pro B Natriuretic peptide (BNP): 17930 pg/mL — ABNORMAL HIGH (ref 0–125)

## 2012-04-20 MED ORDER — FUROSEMIDE 40 MG PO TABS
40.0000 mg | ORAL_TABLET | Freq: Once | ORAL | Status: AC
  Administered 2012-04-20: 40 mg via ORAL
  Filled 2012-04-20: qty 1

## 2012-04-20 MED ORDER — FUROSEMIDE 40 MG PO TABS: 80.0000 mg | ORAL_TABLET | Freq: Every day | ORAL | Status: DC

## 2012-04-20 NOTE — ED Notes (Signed)
Pt denies SOB, dizziness, nausea.

## 2012-04-20 NOTE — Progress Notes (Signed)
*  Preliminary Results* Bilateral lower extremity venous duplex completed. Visualized veins of bilateral lower extremities are negative for deep vein thrombosis. Unable to visualize right posterior tibial, right peroneal, and left distal femoral veins, and limited visualization of the left posterior tibial and peroneal veins, therefore deep vein thrombosis cannot be excluded in these segments. No evidence of Baker's cyst bilaterally. Preliminary results discussed with Marzetta Board, RN.  04/20/2012 9:08 AM Maudry Mayhew, RDMS, RDCS

## 2012-04-20 NOTE — ED Notes (Signed)
Report received-airway intact, no s/s's of distress-will continue to monitor 

## 2012-04-20 NOTE — ED Notes (Signed)
Lab draw attempted x2 unsuccessfully. RN made aware

## 2012-04-20 NOTE — ED Provider Notes (Signed)
Pt with hx of CHF - CAD s/p stenting in the past and ? RUE DVT several years ago who presents with 1 week of gradual leg swelling - R>L, She has hx of broken leg on the R and thus always has  More swellign on that side.  She denies CP, cough, fever, or DOE / PND / orthopnea.  She was referred here by her PMD Dr. Jeanie Cooks but has appointment today to see him.  Last Echo was several years ago - EF of 45% at that time.  On exam she is in NAD, has clear lungs and heart sounds without murmurs or rubs and has soft abd withi bilateral pitting edema of the LE's R>L.    Lasix, ECG, trop, BNP is elevated, schedule [place] for AM to r/o DVT.  Medical screening examination/treatment/procedure(s) were conducted as a shared visit with non-physician practitioner(s) and myself.  I personally evaluated the patient during the encounter    Johnna Acosta, MD 04/20/12 [phone]

## 2012-04-20 NOTE — ED Provider Notes (Signed)
This patient was handed off to me by Hazel Sams PAC to obtain Dopplers of bilateral lower legs due to swelling. Patient is having mild CHF exacerbation as well and which she has received Lasix. The patient is in stable condition.  The Doppler show that the patient has no DVT. She will be discharged with her Lasix being increased to 80 mg by mouth. She is to followup with her primary care physician. I've discussed this result with her and the plan and she is understanding.  Pt has been advised of the symptoms that warrant their return to the ED. Patient has voiced understanding and has agreed to follow-up with the PCP or specialist.   Linus Mako, [place] 04/20/12 [phone]

## 2012-04-20 NOTE — ED Provider Notes (Signed)
History     CSN: [id number]  Arrival date & time 04/19/12  2306   First MD Initiated Contact with Patient 04/20/12 0252      Chief Complaint  Patient presents with  . Leg Swelling   HPI  History provided by the patient. Patient is a 66 year old female with history of hypertension, diabetes, hyperlipidemia, CAD status post stent, CHF and history of breast cancer who presents with complaints of increasing bilateral lower extremity swelling for the past one week. Patient denies having any pain with symptoms. She does report being seen by her podiatrist yesterday in [place] for regular evaluation. Patient does currently take Lasix for her CHF. She takes 40 mg per day and reports taking this regularly. Symptoms have been gradual. She denies any other associated symptoms. Denies any associated pain. Denies any redness of the skin. Denies any chest pain, shortness of breath or heart palpations. Denies any increased orthopnea or PND.    Past Medical History  Diagnosis Date  . Diabetes mellitus   . Hypertension   . Morbid obesity   . Coronary artery disease   . Chest pain   . 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 Hyperlipidemia   . Hx of breast cancer   . CHF (congestive heart failure)   . SOB (shortness of breath)   . Chronic kidney disease   . Cancer 2005     left breast    Past Surgical History  Procedure Date  . Back surgery   . Tubal ligation   . Transthoracic echocardiogram 10/01/2010     Left ventricle: The cavity size was mildly dilated. Wall thickness was increased in a pattern of mild LVH. Systolic function was   mildly reduced. The estimated ejection fraction was in the range  of 45% to 50%.   . Coronary angioplasty 12/19/2003    inferior wall hypokinesis. ef 50%  . Arm surgery     Left arm trauma  . Mastectomy     Left    Family History  Problem Relation Age of Onset  . Breast cancer Mother     Died age 49    History  Substance Use Topics  . Smoking status: Former  Smoker -- 1.0 packs/day for 30 years    Types: Cigarettes    Quit date: 05/16/2002  . Smokeless tobacco: Never Used  . Alcohol Use: No    OB History    Grav Para Term Preterm Abortions TAB SAB Ect Mult Living                  Review of Systems  Constitutional: Negative for fever, chills and diaphoresis.  Respiratory: Negative for cough and shortness of breath.   Cardiovascular: Positive for leg swelling. Negative for chest pain and palpitations.  Gastrointestinal: Positive for rectal pain.  All other systems reviewed and are negative.    Allergies  Review of patient's allergies indicates no known allergies.  Home Medications   Current Outpatient Rx  Name  Route  Sig  Dispense  Refill  . ALBUTEROL SULFATE HFA 108 (90 BASE) MCG/ACT IN AERS   Inhalation   Inhale 2 puffs into the lungs every 4 (four) hours as needed for wheezing.   1 Inhaler   0   . AMLODIPINE BESYLATE 10 MG PO TABS   Oral   Take 10 mg by mouth daily.           . ASPIRIN 325 MG PO TABS   Oral  Take 325 mg by mouth daily.           . FUROSEMIDE 40 MG PO TABS   Oral   Take 40 mg by mouth daily.         . INSULIN ASPART PROT & ASPART (70-30) 100 UNIT/ML [place] SUSP   Subcutaneous   Inject 30-65 Units into the skin 2 (two) times daily with a meal. Inject 65 units in the morning Inject 30 units in the evening         . LORATADINE 10 MG PO TABS   Oral   Take 10 mg by mouth daily as needed. For allergies.         Marland Kitchen METOPROLOL TARTRATE 50 MG PO TABS   Oral   Take 50 mg by mouth 2 (two) times daily.           Marland Kitchen OLMESARTAN MEDOXOMIL 20 MG PO TABS   Oral   Take 20 mg by mouth every morning.          Marland Kitchen POTASSIUM CHLORIDE ER 10 MEQ PO TBCR   Oral   Take 10 mEq by mouth daily.         Marland Kitchen ROSUVASTATIN CALCIUM 10 MG PO TABS   Oral   Take 10 mg by mouth daily.         Marland Kitchen NITROGLYCERIN 0.4 MG SL SUBL   Sublingual   Place 0.4 mg under the tongue every 5 (five) minutes as needed. For  chest pain.           BP 141/73  Pulse 87  Temp 98 F (36.7 C) (Oral)  Resp 20  SpO2 97%  Physical Exam  Nursing note and vitals reviewed. Constitutional: She is oriented to person, place, and time. She appears well-developed and well-nourished. No distress.  HENT:  Head: Normocephalic.  Neck: No JVD present.  Cardiovascular: Normal rate and regular rhythm.   Pulmonary/Chest: Effort normal and breath sounds normal. No respiratory distress. She has no wheezes. She has no rales.  Abdominal: Soft. There is no tenderness. There is no rebound and no guarding.  Musculoskeletal: She exhibits edema. She exhibits no tenderness.       Significant bilateral lower extremity edema with pitting R>L.  Normal sensations, skin temperature and cap refill in toes.  Neurological: She is alert and oriented to person, place, and time.  Skin: Skin is warm and dry. No rash noted.  Psychiatric: She has a normal mood and affect. Her behavior is normal.    ED Course  Procedures   Results for orders placed during the hospital encounter of 04/19/12  CBC WITH DIFFERENTIAL      Component Value Range   WBC 8.8  4.0 - 10.5 K/uL   RBC 4.80  3.87 - 5.11 MIL/uL   Hemoglobin 11.2 (*) 12.0 - 15.0 g/dL   HCT 33.8 (*) 36.0 - 46.0 %   MCV 70.4 (*) 78.0 - 100.0 fL   MCH 23.3 (*) 26.0 - 34.0 pg   MCHC 33.1  30.0 - 36.0 g/dL   RDW 16.8 (*) 11.5 - 15.5 %   Platelets 196  150 - 400 K/uL   Neutrophils Relative 59  43 - 77 %   Lymphocytes Relative 25  12 - 46 %   Monocytes Relative 10  3 - 12 %   Eosinophils Relative 5  0 - 5 %   Basophils Relative 1  0 - 1 %   Neutro Abs 5.2  1.7 -  7.7 K/uL   Lymphs Abs 2.2  0.7 - 4.0 K/uL   Monocytes Absolute 0.9  0.1 - 1.0 K/uL   Eosinophils Absolute 0.4  0.0 - 0.7 K/uL   Basophils Absolute 0.1  0.0 - 0.1 K/uL   WBC Morphology TOXIC GRANULATION    COMPREHENSIVE METABOLIC PANEL      Component Value Range   Sodium 136  135 - 145 mEq/L   Potassium 4.7  3.5 - 5.1 mEq/L    Chloride 107  96 - 112 mEq/L   CO2 20  19 - 32 mEq/L   Glucose, Bld 94  70 - 99 mg/dL   BUN 57 (*) 6 - 23 mg/dL   Creatinine, Ser 3.24 (*) 0.50 - 1.10 mg/dL   Calcium 8.8  8.4 - 10.5 mg/dL   Total Protein 6.6  6.0 - 8.3 g/dL   Albumin 2.9 (*) 3.5 - 5.2 g/dL   AST 20  0 - 37 U/L   ALT 21  0 - 35 U/L   Alkaline Phosphatase 57  39 - 117 U/L   Total Bilirubin 0.3  0.3 - 1.2 mg/dL   GFR calc non Af Amer 14 (*) >90 mL/min   GFR calc Af Amer 16 (*) >90 mL/min  PRO B NATRIURETIC PEPTIDE      Component Value Range   Pro B Natriuretic peptide (BNP) 17930.0 (*) 0 - 125 pg/mL      Dg Chest 2 View  04/20/2012  *RADIOLOGY REPORT*  Clinical Data: Leg swelling for 1 week.  CHEST - 2 VIEW  Comparison: Chest radiograph performed 01/20/2013  Findings: The lungs are well-aerated.  Chronic vascular congestion is noted.  There is no evidence of focal opacification, pleural effusion or pneumothorax.  The heart is enlarged; calcification is noted within the aortic arch.  No acute osseous abnormalities are seen.  Left axillary clips are seen.  IMPRESSION: Chronic vascular congestion and cardiomegaly; no acute cardiopulmonary process seen.   Original Report Authenticated By: Santa Lighter, M.D.      1. CHF (congestive heart failure)   2. Bilateral edema of lower extremity       MDM  Patient seen and evaluated. Patient resting comfortably in bed in no acute distress. Patient with normal respirations and no respiratory complaints.  Patient was seen and evaluated with attending physician. Patient was significantly elevated BNP. She has significant bilateral lower extremity swelling. Right seem to be more swollen than left. We'll plan to add on chest x-ray, EKG and troponin. Lasix has been ordered.  Pt discussed in sign out with Verdene Rio PA-C.  She will follow troponin and US doppler results.    Date: 04/20/2012  Rate: 88  Rhythm: normal sinus rhythm  QRS Axis: normal  Intervals: normal  ST/T  Wave abnormalities: nonspecific T wave changes  Conduction Disutrbances: Incomplete left bundle branch block  Narrative Interpretation:   Old EKG Reviewed: unchanged from January 21, 2012     [place], [place] 04/20/12 [phone]

## 2012-04-20 NOTE — ED Provider Notes (Addendum)
   Medical screening examination/treatment/procedure(s) were conducted as a shared visit with non-physician practitioner(s) and myself.  I personally evaluated the patient during the encounter  Please see my separate respective documentation pertaining to this patient encounter  Change of shift, care signed out to Dr. Melynda Ripple, MD 04/20/12 [id number]  Johnna Acosta, MD 04/20/12 [phone]

## 2012-04-23 NOTE — ED Provider Notes (Signed)
Medical screening examination/treatment/procedure(s) were performed by non-physician practitioner and as supervising physician I was immediately available for consultation/collaboration.  [name], MD 04/23/12 [phone]

## 2012-08-01 ENCOUNTER — Encounter (HOSPITAL_COMMUNITY): Payer: Self-pay | Admitting: *Deleted

## 2012-08-01 ENCOUNTER — Emergency Department (HOSPITAL_COMMUNITY)
Admission: EM | Admit: 2012-08-01 | Discharge: 2012-08-01 | Disposition: A | Discharge status: Home / Self Care | Payer: Medicare Other | Attending: Emergency Medicine | Admitting: Emergency Medicine

## 2012-08-01 ENCOUNTER — Emergency Department (HOSPITAL_COMMUNITY): Payer: Medicare Other

## 2012-08-01 DIAGNOSIS — N189 Chronic kidney disease, unspecified: Secondary | ICD-10-CM | POA: Insufficient documentation

## 2012-08-01 DIAGNOSIS — I509 Heart failure, unspecified: Secondary | ICD-10-CM | POA: Insufficient documentation

## 2012-08-01 DIAGNOSIS — E785 Hyperlipidemia, unspecified: Secondary | ICD-10-CM | POA: Insufficient documentation

## 2012-08-01 DIAGNOSIS — I2510 Atherosclerotic heart disease of native coronary artery without angina pectoris: Secondary | ICD-10-CM | POA: Insufficient documentation

## 2012-08-01 DIAGNOSIS — R0602 Shortness of breath: Secondary | ICD-10-CM | POA: Insufficient documentation

## 2012-08-01 DIAGNOSIS — E119 Type 2 diabetes mellitus without complications: Secondary | ICD-10-CM | POA: Insufficient documentation

## 2012-08-01 DIAGNOSIS — Z9861 Coronary angioplasty status: Secondary | ICD-10-CM | POA: Insufficient documentation

## 2012-08-01 DIAGNOSIS — Z79899 Other long term (current) drug therapy: Secondary | ICD-10-CM | POA: Insufficient documentation

## 2012-08-01 DIAGNOSIS — Z794 Long term (current) use of insulin: Secondary | ICD-10-CM | POA: Insufficient documentation

## 2012-08-01 DIAGNOSIS — R609 Edema, unspecified: Secondary | ICD-10-CM | POA: Insufficient documentation

## 2012-08-01 DIAGNOSIS — Z87891 Personal history of nicotine dependence: Secondary | ICD-10-CM | POA: Insufficient documentation

## 2012-08-01 DIAGNOSIS — I129 Hypertensive chronic kidney disease with stage 1 through stage 4 chronic kidney disease, or unspecified chronic kidney disease: Secondary | ICD-10-CM | POA: Insufficient documentation

## 2012-08-01 DIAGNOSIS — I252 Old myocardial infarction: Secondary | ICD-10-CM | POA: Insufficient documentation

## 2012-08-01 DIAGNOSIS — Z7982 Long term (current) use of aspirin: Secondary | ICD-10-CM | POA: Insufficient documentation

## 2012-08-01 DIAGNOSIS — Z853 Personal history of malignant neoplasm of breast: Secondary | ICD-10-CM | POA: Insufficient documentation

## 2012-08-01 LAB — BASIC METABOLIC PANEL
BUN: 55 mg/dL — ABNORMAL HIGH (ref 6–23)
CO2: 20 mEq/L (ref 19–32)
Chloride: 107 mEq/L (ref 96–112)
Glucose, Bld: 99 mg/dL (ref 70–99)
Potassium: 4.6 mEq/L (ref 3.5–5.1)

## 2012-08-01 LAB — CBC WITH DIFFERENTIAL/PLATELET
Basophils Relative: 1 % (ref 0–1)
Eosinophils Absolute: 0.5 10*3/uL (ref 0.0–0.7)
Eosinophils Relative: 6 % — ABNORMAL HIGH (ref 0–5)
HCT: 30.8 % — ABNORMAL LOW (ref 36.0–46.0)
Hemoglobin: 10.8 g/dL — ABNORMAL LOW (ref 12.0–15.0)
Lymphocytes Relative: 20 % (ref 12–46)
MCHC: 35.1 g/dL (ref 30.0–36.0)
Monocytes Relative: 8 % (ref 3–12)
Neutro Abs: 5.7 10*3/uL (ref 1.7–7.7)
RBC: 4.6 MIL/uL (ref 3.87–5.11)

## 2012-08-01 LAB — PRO B NATRIURETIC PEPTIDE: Pro B Natriuretic peptide (BNP): 20733 pg/mL — ABNORMAL HIGH (ref 0–125)

## 2012-08-01 MED ORDER — FUROSEMIDE 10 MG/ML IJ SOLN
80.0000 mg | Freq: Once | INTRAMUSCULAR | Status: AC
  Administered 2012-08-01: 80 mg via INTRAVENOUS
  Filled 2012-08-01: qty 8

## 2012-08-01 NOTE — ED Notes (Signed)
Pt states that she began having swelling to rt leg about 1 month ago. Pt states that about 1 week ago swelling moved to left side. Denies pain.

## 2012-08-01 NOTE — ED Provider Notes (Signed)
History     CSN: [id number]  Arrival date & time 08/01/12  [id number]   First MD Initiated Contact with Patient 08/01/12 0730      No chief complaint on file.   (Consider location/radiation/quality/duration/timing/severity/associated sxs/prior treatment) HPI Comments: Patient with a history of CHF presents with a chief complaint of lower extremity swelling bilaterally.  She reports that she has had edema of her right leg for the past month and her left leg for the past week.  She denies any pain in her legs.  She reports that she had similar edema one month ago and was told by her doctor to double up the dose of her Lasix for one week, which she did and states it helped.  She is currently taking Lasix 40 mg daily.  She reports that she does have some DOE, but no SOB at rest.  She denies orthopnea or PND.  Denies chest pain.  Her PCP is Dr. Jeanie Cooks.  Last Echo was done in 2009, which showed an EF of 45%.    The history is provided by the patient.    Past Medical History  Diagnosis Date  . Diabetes mellitus   . Hypertension   . Morbid obesity   . Coronary artery disease   . Chest pain   . 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 Hyperlipidemia   . Hx of breast cancer   . CHF (congestive heart failure)   . SOB (shortness of breath)   . Chronic kidney disease   . Cancer 2005     left breast    Past Surgical History  Procedure Laterality Date  . Back surgery    . Tubal ligation    . Transthoracic echocardiogram  10/01/2010     Left ventricle: The cavity size was mildly dilated. Wall thickness was increased in a pattern of mild LVH. Systolic function was   mildly reduced. The estimated ejection fraction was in the range  of 45% to 50%.   . Coronary angioplasty  12/19/2003    inferior wall hypokinesis. ef 50%  . Arm surgery      Left arm trauma  . Mastectomy      Left    Family History  Problem Relation Age of Onset  . Breast cancer Mother     Died age 37    History  Substance Use Topics   . Smoking status: Former Smoker -- 1.00 packs/day for 30 years    Types: Cigarettes    Quit date: 05/16/2002  . Smokeless tobacco: Never Used  . Alcohol Use: No    OB History   Grav Para Term Preterm Abortions TAB SAB Ect Mult Living                  Review of Systems  Constitutional: Negative for fever, chills and diaphoresis.  Respiratory: Positive for shortness of breath.        Dyspnea on exertion Denies PND Denies orthopnea  Cardiovascular: Positive for leg swelling. Negative for chest pain.  All other systems reviewed and are negative.    Allergies  Review of patient's allergies indicates no known allergies.  Home Medications   Current Outpatient Rx  Name  Route  Sig  Dispense  Refill  . albuterol (PROVENTIL HFA;VENTOLIN HFA) 108 (90 BASE) MCG/ACT inhaler   Inhalation   Inhale 2 puffs into the lungs every 4 (four) hours as needed for wheezing.   1 Inhaler   0   .  amLODipine (NORVASC) 10 MG tablet   Oral   Take 10 mg by mouth daily.           Marland Kitchen aspirin 325 MG tablet   Oral   Take 325 mg by mouth daily.           . furosemide (LASIX) 40 MG tablet   Oral   Take 40 mg by mouth daily.         . furosemide (LASIX) 40 MG tablet   Oral   Take 2 tablets (80 mg total) by mouth daily.   20 tablet   0   . insulin aspart protamine-insulin aspart (NOVOLOG 70/30) (70-30) 100 UNIT/ML injection   Subcutaneous   Inject 30-65 Units into the skin 2 (two) times daily with a meal. Inject 65 units in the morning Inject 30 units in the evening         . loratadine (CLARITIN) 10 MG tablet   Oral   Take 10 mg by mouth daily as needed. For allergies.         . metoprolol (LOPRESSOR) 50 MG tablet   Oral   Take 50 mg by mouth 2 (two) times daily.           Marland Kitchen EXPIRED: nitroGLYCERIN (NITROSTAT) 0.4 MG SL tablet   Sublingual   Place 0.4 mg under the tongue every 5 (five) minutes as needed. For chest pain.         Marland Kitchen olmesartan (BENICAR) 20 MG tablet    Oral   Take 20 mg by mouth every morning.          . potassium chloride (K-DUR) 10 MEQ tablet   Oral   Take 10 mEq by mouth daily.         . rosuvastatin (CRESTOR) 10 MG tablet   Oral   Take 10 mg by mouth daily.           BP 139/78  Pulse 76  Temp(Src) 97.7 F (36.5 C) (Oral)  Resp 17  SpO2 89%  Physical Exam  Nursing note and vitals reviewed. Constitutional: She appears well-developed and well-nourished. No distress.  HENT:  Head: Normocephalic and atraumatic.  Mouth/Throat: Oropharynx is clear and moist.  Neck: Normal range of motion. Neck supple.  Cardiovascular: Normal rate and regular rhythm.   Murmur heard. 2+ bilateral pitting edema from the knee distally   Pulmonary/Chest: Effort normal and breath sounds normal. No respiratory distress. She has no wheezes. She has no rales.  Musculoskeletal: Normal range of motion.  Neurological: She is alert.  Skin: Skin is warm and dry. She is not diaphoretic.  Psychiatric: She has a normal mood and affect.    ED Course  Procedures (including critical care time)  Labs Reviewed - No data to display Dg Chest 2 View  08/01/2012  *RADIOLOGY REPORT*  Clinical Data: Shortness of breath.  History of congestive heart failure.  CHEST - 2 VIEW  Comparison: Chest x-ray 04/20/2012.  Findings: There is cephalization of the pulmonary vasculature and slight indistinctness of the interstitial markings suggestive of mild pulmonary edema.  Moderate cardiomegaly, significantly increased compared to the prior study.  No definite pleural effusions.  Upper mediastinal contours are within normal limits. Atherosclerosis in the thoracic aorta.  Postoperative changes of left axillary lymph node dissection are noted.  IMPRESSION: 1.  Appearance of the chest, as above, suggest mild congestive heart failure with worsening cardiomegaly compared to the prior study. 2.  Atherosclerosis.   Original Report Authenticated By:  Vinnie Langton, M.D.      No  diagnosis found.    MDM  Patient with a history of CHF presents today with a chief complaint of bilateral edema.  Patient complaining of some DOE, but no SOB at rest.   No CP.  Patient currently taking 40 mg oral Lasix at home.  Patient given 80 mg IV in the ED.  CXR showing mild CHF.   Therefore, feel that the patient can be discharged home.  Patient instructed to double up her home dose of Lasix for the next 4 days and to follow up with her PCP.        Sherlyn Lees [place], PA-C 08/01/12 1845

## 2012-08-01 NOTE — ED Provider Notes (Signed)
Medical screening examination/treatment/procedure(s) were conducted as a shared visit with non-physician practitioner(s) and myself.  I personally evaluated the patient during the encounter   Mervin Kung, MD 08/01/12 2044

## 2012-08-01 NOTE — ED Provider Notes (Signed)
Medical screening examination/treatment/procedure(s) were conducted as a shared visit with non-physician practitioner(s) and myself.  I personally evaluated the patient during the encounter  Patient seen by me. Followed by Dr. Cherlynn Kaiser. Patient has a history of CHF. Patient's had some bilateral leg swelling here of recent. Patient also has developed shortness of breath. Concern is for developing a recurrent CHF. Workup will be directed towards that. Patient given 80 mg of IV Lasix here chest x-ray is pending labs also patient denies any chest pain. ding. Patient is nontoxic no acute distress.  Mervin Kung, MD 08/01/12 [phone]

## 2012-08-15 ENCOUNTER — Encounter: Payer: Self-pay | Admitting: Cardiovascular Disease

## 2012-08-15 ENCOUNTER — Encounter: Payer: Self-pay | Admitting: Physician Assistant

## 2012-08-15 ENCOUNTER — Ambulatory Visit (INDEPENDENT_AMBULATORY_CARE_PROVIDER_SITE_OTHER): Payer: Medicare Other | Admitting: Physician Assistant

## 2012-08-15 VITALS — BP 150/100 | HR 94 | Ht 66.0 in | Wt 271.0 lb

## 2012-08-15 DIAGNOSIS — I10 Essential (primary) hypertension: Secondary | ICD-10-CM | POA: Insufficient documentation

## 2012-08-15 DIAGNOSIS — I509 Heart failure, unspecified: Secondary | ICD-10-CM

## 2012-08-15 DIAGNOSIS — R0602 Shortness of breath: Secondary | ICD-10-CM

## 2012-08-15 LAB — BASIC METABOLIC PANEL
BUN: 36 mg/dL — ABNORMAL HIGH (ref 6–23)
CO2: 23 mEq/L (ref 19–32)
Chloride: 109 mEq/L (ref 96–112)
Glucose, Bld: 157 mg/dL — ABNORMAL HIGH (ref 70–99)
Potassium: 4.1 mEq/L (ref 3.5–5.1)
Sodium: 137 mEq/L (ref 135–145)

## 2012-08-15 MED ORDER — CARVEDILOL 6.25 MG PO TABS: 6.2500 mg | ORAL_TABLET | Freq: Two times a day (BID) | ORAL | Status: DC

## 2012-08-15 MED ORDER — FUROSEMIDE 40 MG PO TABS: ORAL_TABLET | ORAL | Status: DC

## 2012-08-15 NOTE — Assessment & Plan Note (Signed)
Weight loss recommended 

## 2012-08-15 NOTE — Patient Instructions (Addendum)
Your physician has requested that you have an echocardiogram. Echocardiography is a painless test that uses sound waves to create images of your heart. It provides your doctor with information about the size and shape of your heart and how well your heart's chambers and valves are working. This procedure takes approximately one hour. There are no restrictions for this procedure.  Your physician has recommended you make the following change in your medication: increase Furosemide to 40 mg twice daily for 3 days then decrease to 40 mg in the mornings and 20 mg (1/2 tablet) in the evening and start taking Carvedilol 6.25 twice daily  Your physician recommends that you return for lab work in: today and in 2 weeks  Your physician recommends that you schedule a follow-up appointment in: 2 weeks  Your physician recommends that you weigh, daily, at the same time every day, and in the same amount of clothing. Please record your daily weights on the handout provided and bring it to your next appointment.  2 Gram Low Sodium Diet A 2 gram sodium diet restricts the amount of sodium in the diet to no more than 2 g or 2000 mg daily. Limiting the amount of sodium is often used to help lower blood pressure. It is important if you have heart, liver, or kidney problems. Many foods contain sodium for flavor and sometimes as a preservative. When the amount of sodium in a diet needs to be low, it is important to know what to look for when choosing foods and drinks. The following includes some information and guidelines to help make it easier for you to adapt to a low sodium diet. QUICK TIPS  Do not add salt to food.  Avoid convenience items and fast food.  Choose unsalted snack foods.  Buy lower sodium products, often labeled as "lower sodium" or "no salt added."  Check food labels to learn how much sodium is in 1 serving.  When eating at a restaurant, ask that your food be prepared with less salt or none, if  possible. READING FOOD LABELS FOR SODIUM INFORMATION The nutrition facts label is a good place to find how much sodium is in foods. Look for products with no more than 500 to 600 mg of sodium per meal and no more than 150 mg per serving. Remember that 2 g = 2000 mg. The food label may also list foods as:  Sodium-free: Less than 5 mg in a serving.  Very low sodium: 35 mg or less in a serving.  Low-sodium: 140 mg or less in a serving.  Light in sodium: 50% less sodium in a serving. For example, if a food that usually has 300 mg of sodium is changed to become light in sodium, it will have 150 mg of sodium.  Reduced sodium: 25% less sodium in a serving. For example, if a food that usually has 400 mg of sodium is changed to reduced sodium, it will have 300 mg of sodium. CHOOSING FOODS Grains  Avoid: Salted crackers and snack items. Some cereals, including instant hot cereals. Bread stuffing and biscuit mixes. Seasoned rice or pasta mixes.  Choose: Unsalted snack items. Low-sodium cereals, oats, puffed wheat and rice, shredded wheat. English muffins and bread. Pasta. Meats  Avoid: Salted, canned, smoked, spiced, pickled meats, including fish and poultry. Bacon, ham, sausage, cold cuts, hot dogs, anchovies.  Choose: Low-sodium canned tuna and salmon. Fresh or frozen meat, poultry, and fish. Dairy  Avoid: Processed cheese and spreads. Cottage cheese. Buttermilk  and condensed milk. Regular cheese.  Choose: Milk. Low-sodium cottage cheese. Yogurt. Sour cream. Low-sodium cheese. Fruits and Vegetables  Avoid: Regular canned vegetables. Regular canned tomato sauce and paste. Frozen vegetables in sauces. Olives. Angie Fava. Relishes. Sauerkraut.  Choose: Low-sodium canned vegetables. Low-sodium tomato sauce and paste. Frozen or fresh vegetables. Fresh and frozen fruit. Condiments  Avoid: Canned and packaged gravies. Worcestershire sauce. Tartar sauce. Barbecue sauce. Soy sauce. Steak sauce.  Ketchup. Onion, garlic, and table salt. Meat flavorings and tenderizers.  Choose: Fresh and dried herbs and spices. Low-sodium varieties of mustard and ketchup. Lemon juice. Tabasco sauce. Horseradish. SAMPLE 2 GRAM SODIUM MEAL PLAN Breakfast / Sodium (mg)  1 cup low-fat milk / [id number] mg  2 slices whole-wheat toast / 270 mg  1 tbs heart-healthy margarine / 153 mg  1 hard-boiled egg / 139 mg  1 small orange / 0 mg Lunch / Sodium (mg)  1 cup raw carrots / 76 mg   cup hummus / 298 mg  1 cup low-fat milk / 143 mg   cup red grapes / 2 mg  1 whole-wheat pita bread / 356 mg Dinner / Sodium (mg)  1 cup whole-wheat pasta / 2 mg  1 cup low-sodium tomato sauce / 73 mg  3 oz lean ground beef / 57 mg  1 small side salad (1 cup raw spinach leaves,  cup cucumber,  cup yellow bell pepper) with 1 tsp olive oil and 1 tsp red wine vinegar / 25 mg Snack / Sodium (mg)  1 container low-fat vanilla yogurt / 107 mg  3 graham cracker squares / 127 mg Nutrient Analysis  Calories: 2033  Protein: 77 g  Carbohydrate: 282 g  Fat: 72 g  Sodium: 1971 mg Document Released: 05/02/2005 Document Revised: 07/25/2011 Document Reviewed: 08/03/2009 [facility] Patient Information 2013 [place].

## 2012-08-15 NOTE — Progress Notes (Signed)
HPI:  This is a 67 year old [place] American female patient of Dr.Nasher  Who has remote history of coronary artery disease status post stent placement in [place] in 2005. She states she's had a stress test approximately 2 years ago but we are having trouble locating this. Last 2-D echo in 2012 showed mild LVH ejection fraction 45-50%.  The patient has had 2 visits to the emergency room in the past month for congestive heart failure. She complains of 3-4 month history of dyspnea on exertion and worsening leg edema. She also has chronic renal insufficiency with creatinines a year ago at 2.1 but most recently creatinine was 2.93 and BNP was over 20,000 on August 02, 2012. She does admit to eating some canned foods including pork and beans and lunch meats. She denies any chest pain, palpitations, dizziness or presyncope. She says she never had chest pain when she had her angioplasty back in 2005 but cannot remember the events surrounding this.   No Known Allergies  Current Outpatient Prescriptions on File Prior to Visit: amLODipine (NORVASC) 10 MG tablet, Take 10 mg by mouth daily.  , Disp: , Rfl:  aspirin 325 MG tablet, Take 325 mg by mouth daily.  , Disp: , Rfl:  furosemide (LASIX) 40 MG tablet, Take 40-80 mg by mouth daily as needed (for swelling). , Disp: , Rfl:  insulin aspart protamine-insulin aspart (NOVOLOG 70/30) (70-30) 100 UNIT/ML injection, Inject 60 Units into the skin 2 (two) times daily with a meal., Disp: , Rfl:  loratadine (CLARITIN) 10 MG tablet, Take 10 mg by mouth daily., Disp: , Rfl:  metoprolol (LOPRESSOR) 50 MG tablet, Take 50 mg by mouth daily., Disp: , Rfl:  nitroGLYCERIN (NITROSTAT) 0.4 MG SL tablet, Place 0.4 mg under the tongue every 5 (five) minutes as needed for chest pain., Disp: , Rfl:  olmesartan (BENICAR) 20 MG tablet, Take 20 mg by mouth daily., Disp: , Rfl:   potassium chloride (K-DUR) 10 MEQ tablet, Take 10 mEq by mouth daily., Disp: , Rfl:  rosuvastatin (CRESTOR)  10 MG tablet, Take 10 mg by mouth daily., Disp: , Rfl:   No current facility-administered medications on file prior to visit.   Past Medical History:   Diabetes mellitus                                            Hypertension                                                 Morbid obesity                                               Coronary artery disease                                      Chest pain  MI, old                                                        Comment:INFERIOR WALL   Hyperlipidemia                                               Hx of breast cancer                                          CHF (congestive heart failure)                               SOB (shortness of breath)                                    Chronic kidney disease                                       Cancer                                          2005           Comment: left breast  Past Surgical History:   BACK SURGERY                                                  TUBAL LIGATION                                                TRANSTHORACIC ECHOCARDIOGRAM                     10/01/2010      Comment: Left ventricle: The cavity size was mildly               dilated. Wall thickness was increased in a               pattern of mild LVH. Systolic function was                 mildly reduced. The estimated ejection fraction              was in the range  of 45% to 50%.    CORONARY ANGIOPLASTY                             12/19/2003       Comment:inferior wall hypokinesis. 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 Neck: sllight increase JVD, HJR,no Bruit, or thyroid enlargement  Lungs: decreased breath sounds throughout but No tachypnea, clear without wheezing, rales, or rhonchi  Cardiovascular: RRR, PMI not displaced, heart sounds normal, no murmurs, gallops, bruit, thrill, or heave.  Abdomen: BS normal. Soft without organomegaly, masses, lesions or tenderness.  Extremities: +2 tight edema on the right hallway at her knee +1 on the left, otherwise lower extremities without cyanosis, clubbing . Decreased distal pulses bilateral  SKin: Warm, no lesions or rashes   Musculoskeletal: No deformities  Neuro: no focal signs  BP 150/100  Pulse 94  Ht 5\' 6"  (1.676 m)  Wt 271 lb (122.925 kg)  BMI 43.76 kg/m2   [id number] sinus rhythm at 94 beats per minute with incomplete left bundle branch block and nonspecific ST-T wave changes laterally. EKG unchanged from prior tracing

## 2012-08-15 NOTE — Assessment & Plan Note (Signed)
Patient had a stent in [place] and 2005. She denies any chest pain but did not have chest pain at the time of her stent. She may need a stress Myoview in the future to rule out ischemic cause of heart failure.

## 2012-08-15 NOTE — Assessment & Plan Note (Signed)
Will check today. To see renal in May.

## 2012-08-15 NOTE — Assessment & Plan Note (Signed)
Patient has had 2 emergency room visits in the past month for congestive heart failure with BNP is over 20,000. She also has chronic renal insufficiency with creatinines up to [id number] complicating matters. Her last EF was 45%. I will order a 2-D echo to reevaluate this. It is also possible she could have an ischemic component but I will hold off on ordering a stress test until after the 2-D echo. I will increase her Lasix to 40 mg b.i.d. For 3 days and then 40 mg in the morning 20 in the evening. I will add Coreg 6.25 mg b.i.d. For better blood pressure control. She is to follow 2 g sodium diet which I discussed with her. She is to weigh herself daily. I've also ordered TED hose. She will see Dr. Cathie Olden in 2 weeks.

## 2012-08-15 NOTE — Assessment & Plan Note (Signed)
Patient's blood pressure is elevated today but she has not taken her Lasix yet. I have added Coreg and increased her diuretic. 2 g sodium diet.

## 2012-08-19 ENCOUNTER — Encounter (HOSPITAL_COMMUNITY): Payer: Self-pay | Admitting: Emergency Medicine

## 2012-08-19 ENCOUNTER — Emergency Department (HOSPITAL_COMMUNITY): Payer: Medicare Other

## 2012-08-19 ENCOUNTER — Emergency Department (HOSPITAL_COMMUNITY)
Admission: EM | Admit: 2012-08-19 | Discharge: 2012-08-19 | Disposition: A | Discharge status: Home / Self Care | Payer: Medicare Other | Attending: Emergency Medicine | Admitting: Emergency Medicine

## 2012-08-19 DIAGNOSIS — M7989 Other specified soft tissue disorders: Secondary | ICD-10-CM | POA: Insufficient documentation

## 2012-08-19 DIAGNOSIS — I129 Hypertensive chronic kidney disease with stage 1 through stage 4 chronic kidney disease, or unspecified chronic kidney disease: Secondary | ICD-10-CM | POA: Insufficient documentation

## 2012-08-19 DIAGNOSIS — Z87891 Personal history of nicotine dependence: Secondary | ICD-10-CM | POA: Insufficient documentation

## 2012-08-19 DIAGNOSIS — R0789 Other chest pain: Secondary | ICD-10-CM | POA: Insufficient documentation

## 2012-08-19 DIAGNOSIS — E785 Hyperlipidemia, unspecified: Secondary | ICD-10-CM | POA: Insufficient documentation

## 2012-08-19 DIAGNOSIS — I252 Old myocardial infarction: Secondary | ICD-10-CM | POA: Insufficient documentation

## 2012-08-19 DIAGNOSIS — I509 Heart failure, unspecified: Secondary | ICD-10-CM | POA: Insufficient documentation

## 2012-08-19 DIAGNOSIS — Z8709 Personal history of other diseases of the respiratory system: Secondary | ICD-10-CM | POA: Insufficient documentation

## 2012-08-19 DIAGNOSIS — Z853 Personal history of malignant neoplasm of breast: Secondary | ICD-10-CM | POA: Insufficient documentation

## 2012-08-19 DIAGNOSIS — Z79899 Other long term (current) drug therapy: Secondary | ICD-10-CM | POA: Insufficient documentation

## 2012-08-19 DIAGNOSIS — Z9861 Coronary angioplasty status: Secondary | ICD-10-CM | POA: Insufficient documentation

## 2012-08-19 DIAGNOSIS — N189 Chronic kidney disease, unspecified: Secondary | ICD-10-CM | POA: Insufficient documentation

## 2012-08-19 DIAGNOSIS — N390 Urinary tract infection, site not specified: Secondary | ICD-10-CM | POA: Insufficient documentation

## 2012-08-19 DIAGNOSIS — I2510 Atherosclerotic heart disease of native coronary artery without angina pectoris: Secondary | ICD-10-CM | POA: Insufficient documentation

## 2012-08-19 DIAGNOSIS — R42 Dizziness and giddiness: Secondary | ICD-10-CM | POA: Insufficient documentation

## 2012-08-19 DIAGNOSIS — E119 Type 2 diabetes mellitus without complications: Secondary | ICD-10-CM | POA: Insufficient documentation

## 2012-08-19 DIAGNOSIS — Z7982 Long term (current) use of aspirin: Secondary | ICD-10-CM | POA: Insufficient documentation

## 2012-08-19 DIAGNOSIS — Z794 Long term (current) use of insulin: Secondary | ICD-10-CM | POA: Insufficient documentation

## 2012-08-19 LAB — URINALYSIS, ROUTINE W REFLEX MICROSCOPIC
Glucose, UA: NEGATIVE mg/dL
Ketones, ur: NEGATIVE mg/dL
Protein, ur: >300 mg/dL — AB
pH: 5 (ref 5.0–8.0)

## 2012-08-19 LAB — CBC
HCT: 32.5 % — ABNORMAL LOW (ref 36.0–46.0)
Hemoglobin: 10.7 g/dL — ABNORMAL LOW (ref 12.0–15.0)
MCH: 22.8 pg — ABNORMAL LOW (ref 26.0–34.0)
MCHC: 32.9 g/dL (ref 30.0–36.0)
MCV: 69.3 fL — ABNORMAL LOW (ref 78.0–100.0)
Platelets: 239 K/uL (ref 150–400)
RBC: 4.69 MIL/uL (ref 3.87–5.11)
RDW: 18.5 % — ABNORMAL HIGH (ref 11.5–15.5)
WBC: 8.9 K/uL (ref 4.0–10.5)

## 2012-08-19 LAB — POCT I-STAT TROPONIN I: Troponin i, poc: 0.01 ng/mL (ref 0.00–0.08)

## 2012-08-19 LAB — BASIC METABOLIC PANEL WITH GFR
BUN: 40 mg/dL — ABNORMAL HIGH (ref 6–23)
CO2: 21 meq/L (ref 19–32)
Calcium: 8.9 mg/dL (ref 8.4–10.5)
Chloride: 106 meq/L (ref 96–112)
Creatinine, Ser: 2.67 mg/dL — ABNORMAL HIGH (ref 0.50–1.10)
GFR calc Af Amer: 20 mL/min — ABNORMAL LOW
GFR calc non Af Amer: 18 mL/min — ABNORMAL LOW
Glucose, Bld: 194 mg/dL — ABNORMAL HIGH (ref 70–99)
Potassium: 4.8 meq/L (ref 3.5–5.1)
Sodium: 137 meq/L (ref 135–145)

## 2012-08-19 LAB — URINE MICROSCOPIC-ADD ON

## 2012-08-19 LAB — D-DIMER, QUANTITATIVE: D-Dimer, Quant: 2.24 ug{FEU}/mL — ABNORMAL HIGH (ref 0.00–0.48)

## 2012-08-19 LAB — PRO B NATRIURETIC PEPTIDE: Pro B Natriuretic peptide (BNP): 11358 pg/mL — ABNORMAL HIGH (ref 0–125)

## 2012-08-19 MED ORDER — TECHNETIUM TC 99M DIETHYLENETRIAME-PENTAACETIC ACID: 37.0000 | Freq: Once | INTRAVENOUS | Status: DC | PRN

## 2012-08-19 MED ORDER — TECHNETIUM TO 99M ALBUMIN AGGREGATED
4.7000 | Freq: Once | INTRAVENOUS | Status: AC | PRN
  Administered 2012-08-19: 4.7 via INTRAVENOUS

## 2012-08-19 MED ORDER — ASPIRIN 81 MG PO CHEW
324.0000 mg | CHEWABLE_TABLET | Freq: Once | ORAL | Status: AC
  Administered 2012-08-19: 324 mg via ORAL
  Filled 2012-08-19: qty 4

## 2012-08-19 MED ORDER — CEPHALEXIN 500 MG PO CAPS: 500.0000 mg | ORAL_CAPSULE | Freq: Four times a day (QID) | ORAL | Status: DC

## 2012-08-19 MED ORDER — FUROSEMIDE 10 MG/ML IJ SOLN
80.0000 mg | Freq: Once | INTRAMUSCULAR | Status: AC
  Administered 2012-08-19: 80 mg via INTRAVENOUS
  Filled 2012-08-19: qty 8

## 2012-08-19 NOTE — ED Notes (Signed)
Patient transported to [place]

## 2012-08-19 NOTE — ED Provider Notes (Signed)
History     CSN: [id number]  Arrival date & time 08/19/12  0927   First MD Initiated Contact with Patient 08/19/12 [phone]      Chief Complaint  Patient presents with  . Shortness of Breath  . Chest Pain    (Consider location/radiation/quality/duration/timing/severity/associated sxs/prior treatment) HPI Comments: Patient presents with progressively worsening shortness of breath for the past one week. She states she is unable to walk throughout her house which is normally able to do. She saw cardiology 4 days ago and was put on a Lasix tapering dose and told to come back consult them today. She states compliance. She's also had intermittent right-sided chest pain for the past one week last a few minutes at a time. She denies any cough or fever. She is scheduled for echocardiogram in 2 days. She's had mild increase in her lower extremity edema. Denies any abdominal pain nausea or vomiting.  remote history of coronary artery disease status post stent placement in [place] in 2005. She states she's had a stress test approximately 2 years ago. Last 2-D echo in 2012 showed mild LVH ejection fraction 45-50%  The history is provided by the patient and the spouse.    Past Medical History  Diagnosis Date  . Diabetes mellitus   . Hypertension   . Morbid obesity   . Coronary artery disease   . Chest pain   . 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 Hyperlipidemia   . Hx of breast cancer   . CHF (congestive heart failure)   . SOB (shortness of breath)   . Chronic kidney disease   . Cancer 2005     left breast    Past Surgical History  Procedure Laterality Date  . Back surgery    . Tubal ligation    . Transthoracic echocardiogram  10/01/2010     Left ventricle: The cavity size was mildly dilated. Wall thickness was increased in a pattern of mild LVH. Systolic function was   mildly reduced. The estimated ejection fraction was in the range  of 45% to 50%.   . Coronary angioplasty  12/19/2003   inferior wall hypokinesis. ef 50%  . Arm surgery      Left arm trauma  . Mastectomy      Left    Family History  Problem Relation Age of Onset  . Breast cancer Mother     Died age 74    History  Substance Use Topics  . Smoking status: Former Smoker -- 1.00 packs/day for 30 years    Types: Cigarettes    Quit date: 05/16/2002  . Smokeless tobacco: Never Used  . Alcohol Use: No    OB History   Grav Para Term Preterm Abortions TAB SAB Ect Mult Living                  Review of Systems  Constitutional: Negative for fever, activity change and appetite change.  HENT: Negative for congestion and rhinorrhea.   Respiratory: Positive for chest tightness and shortness of breath. Negative for cough.   Cardiovascular: Positive for chest pain and leg swelling.  Gastrointestinal: Negative for nausea, vomiting and abdominal pain.  Genitourinary: Negative for dysuria, hematuria, vaginal bleeding and vaginal discharge.  Musculoskeletal: Negative for back pain.  Skin: Negative for rash.  Neurological: Positive for dizziness and light-headedness. Negative for weakness.  A complete 10 system review of systems was obtained and all systems are negative except as noted in the HPI  and PMH.    Allergies  Review of patient's allergies indicates no known allergies.  Home Medications   Current Outpatient Rx  Name  Route  Sig  Dispense  Refill  . amLODipine (NORVASC) 10 MG tablet   Oral   Take 10 mg by mouth daily.           Marland Kitchen aspirin 325 MG tablet   Oral   Take 325 mg by mouth daily.           . carvedilol (COREG) 6.25 MG tablet   Oral   Take 1 tablet (6.25 mg total) by mouth 2 (two) times daily.   60 tablet   1   . furosemide (LASIX) 40 MG tablet      Take 40 mg twice daily X 3 days then take 40 mg in the am and 20 mg in the pm   48 tablet   2   . insulin aspart protamine-insulin aspart (NOVOLOG 70/30) (70-30) 100 UNIT/ML injection   Subcutaneous   Inject 60 Units into  the skin 2 (two) times daily with a meal.         . loratadine (CLARITIN) 10 MG tablet   Oral   Take 10 mg by mouth daily.         . metoprolol (LOPRESSOR) 50 MG tablet   Oral   Take 50 mg by mouth daily.         Marland Kitchen olmesartan (BENICAR) 20 MG tablet   Oral   Take 20 mg by mouth daily.         . potassium chloride (K-DUR) 10 MEQ tablet   Oral   Take 10 mEq by mouth daily.         . rosuvastatin (CRESTOR) 10 MG tablet   Oral   Take 10 mg by mouth daily.         . cephALEXin (KEFLEX) 500 MG capsule   Oral   Take 1 capsule (500 mg total) by mouth 4 (four) times daily.   40 capsule   0   . nitroGLYCERIN (NITROSTAT) 0.4 MG SL tablet   Sublingual   Place 0.4 mg under the tongue every 5 (five) minutes as needed for chest pain.           BP 135/79  Pulse 75  Temp(Src) 98.4 F (36.9 C) (Oral)  Resp 21  SpO2 100%  Physical Exam  Constitutional: She is oriented to person, place, and time. She appears well-developed and well-nourished. No distress.  HENT:  Head: Normocephalic and atraumatic.  Mouth/Throat: Oropharynx is clear and moist. No oropharyngeal exudate.  Eyes: Conjunctivae and EOM are normal. Pupils are equal, round, and reactive to light.  Neck: Normal range of motion. Neck supple. JVD present.  Cardiovascular: Normal rate, regular rhythm and normal heart sounds.   No murmur heard. Pulmonary/Chest: Effort normal. No respiratory distress. She has rales.  Bibasilar crackles  Abdominal: Soft. There is no tenderness. There is no rebound and no guarding.  Musculoskeletal: Normal range of motion. She exhibits edema.  +1 pitting edema bilaterally to knees.  Neurological: She is alert and oriented to person, place, and time. No cranial nerve deficit. She exhibits normal muscle tone. Coordination normal.  Skin: Skin is warm.    ED Course  Procedures (including critical care time)  Labs Reviewed  CBC - Abnormal; Notable for the following:    Hemoglobin  10.7 (*)    HCT 32.5 (*)    MCV 69.3 (*)  MCH 22.8 (*)    RDW 18.5 (*)    All other components within normal limits  PRO B NATRIURETIC PEPTIDE - Abnormal; Notable for the following:    Pro B Natriuretic peptide (BNP) 11358.0 (*)    All other components within normal limits  BASIC METABOLIC PANEL - Abnormal; Notable for the following:    Glucose, Bld 194 (*)    BUN 40 (*)    Creatinine, Ser 2.67 (*)    GFR calc non Af Amer 18 (*)    GFR calc Af Amer 20 (*)    All other components within normal limits  D-DIMER, QUANTITATIVE - Abnormal; Notable for the following:    D-Dimer, Quant 2.24 (*)    All other components within normal limits  URINALYSIS, ROUTINE W REFLEX MICROSCOPIC - Abnormal; Notable for the following:    APPearance CLOUDY (*)    Hgb urine dipstick SMALL (*)    Protein, ur >300 (*)    Leukocytes, UA SMALL (*)    All other components within normal limits  URINE MICROSCOPIC-ADD ON - Abnormal; Notable for the following:    Bacteria, UA MANY (*)    All other components within normal limits  URINE CULTURE  POCT I-STAT TROPONIN I   Dg Chest 2 View  08/19/2012  *RADIOLOGY REPORT*  Clinical Data: Short of breath  CHEST - 2 VIEW  Comparison: 08/01/2012  Findings: Mild cardiac enlargement.  No pleural effusion or edema.  Gas no airspace consolidation.  Coarsened interstitial markings are noted.  Surgical clips noted within the left axilla.  IMPRESSION:  1.  Cardiac enlargement.  2. No acute cardiopulmonary abnormalities   Original Report Authenticated By: Kerby Moors, M.D.    Nm Pulmonary Perf And Vent  08/19/2012  *RADIOLOGY REPORT*  Clinical Data: Short of breath.  NM PULMONARY VENTILATION AND PERFUSION SCAN  Radiopharmaceutical: 4.7MILLI CURIE MAA TECHNETIUM TO 52M ALBUMIN AGGREGATED 37 mCi of technetium 47m DTPA inhalant  Comparison: Chest radiograph from 08/20/2011.  Findings: On the ventilation portion of the examination there is a relatively uniform distribution of the  radiopharmaceutical both lungs.  On the perfusion portion of the examination there is no medium are heart size segmental perfusion defects.  IMPRESSION:  1. Low probability for acute pulmonary embolus.   Original Report Authenticated By: Kerby Moors, M.D.      1. CHF (congestive heart failure)   2. Urinary tract infection       MDM  Progressively worsening shortness of breath with lower extremity edema. History of CHF, CAD, CKD noncompliance.  EKG unchanged.  CXR clear.  Does not appear acutely volume overloaded. Given IV lasix, ASA. Troponin negative.  D-dimer positive. Hemoglobin stable. Creatinine at baseline. VQ low acuity for PE.  Ambulatory in the ED without desaturation, no increased work of breathing.  Stable for followup with cardiology this week.   Date: 08/19/2012  Rate: 82  Rhythm: normal sinus rhythm  QRS Axis: left  Intervals: normal  ST/T Wave abnormalities: nonspecific ST/T changes  Conduction Disutrbances:none  Narrative Interpretation:   Old EKG Reviewed: unchanged    Ezequiel Essex, MD 08/19/12 1559

## 2012-08-19 NOTE — ED Notes (Signed)
Pt c/o of SOB/chest pain x 1 week. Chest pain is on the right side of chest. Pt states that yesterday she felt dizzy.

## 2012-08-21 ENCOUNTER — Ambulatory Visit (HOSPITAL_COMMUNITY): Payer: Medicare Other | Attending: Physician Assistant | Admitting: Radiology

## 2012-08-21 DIAGNOSIS — R0989 Other specified symptoms and signs involving the circulatory and respiratory systems: Secondary | ICD-10-CM | POA: Insufficient documentation

## 2012-08-21 DIAGNOSIS — R0609 Other forms of dyspnea: Secondary | ICD-10-CM | POA: Insufficient documentation

## 2012-08-21 DIAGNOSIS — I509 Heart failure, unspecified: Secondary | ICD-10-CM | POA: Insufficient documentation

## 2012-08-21 DIAGNOSIS — R609 Edema, unspecified: Secondary | ICD-10-CM | POA: Insufficient documentation

## 2012-08-21 LAB — URINE CULTURE: Colony Count: >=100000

## 2012-08-21 NOTE — Progress Notes (Signed)
Echocardiogram performed.  

## 2012-08-22 ENCOUNTER — Telehealth (HOSPITAL_COMMUNITY): Payer: Self-pay | Admitting: Emergency Medicine

## 2012-08-22 NOTE — ED Notes (Signed)
+  Urine. Patient treated with Keflex. Sensitive to same. Per protocol MD. °

## 2012-08-22 NOTE — ED Notes (Signed)
Patient has +Urine culture. °

## 2012-09-03 ENCOUNTER — Telehealth: Payer: Self-pay | Admitting: Cardiovascular Disease

## 2012-09-03 NOTE — Telephone Encounter (Signed)
Left msg I will try to reach her later.

## 2012-09-03 NOTE — Telephone Encounter (Signed)
LMTCB

## 2012-09-03 NOTE — Telephone Encounter (Signed)
Follow up call   Returning call back to nurse  

## 2012-09-03 NOTE — Telephone Encounter (Signed)
New problem    Pt calling for results

## 2012-09-03 NOTE — Telephone Encounter (Signed)
Follow Up    Pt calling back following up on phone call from a little while ago.

## 2012-09-03 NOTE — Telephone Encounter (Signed)
lmtcb

## 2012-09-04 NOTE — Telephone Encounter (Signed)
Follow Up    Pt calling to follow up on ECHO results from 4/8.

## 2012-09-04 NOTE — Telephone Encounter (Signed)
Echo results given, reviewed high sodium foods to avoid, talked at length 15 minutes about daily weights/ sodium/ her past BNP results. Pt asked questions and all questions were answered. Pt aware of f/u app. Pt agreed to daily wts and understands the importance to call with 3-5 lb wt gain to have meds adjusted.

## 2012-09-07 ENCOUNTER — Emergency Department (HOSPITAL_COMMUNITY)
Admission: EM | Admit: 2012-09-07 | Discharge: 2012-09-07 | Disposition: A | Discharge status: Home / Self Care | Payer: Medicare Other | Attending: Emergency Medicine | Admitting: Emergency Medicine

## 2012-09-07 ENCOUNTER — Encounter (HOSPITAL_COMMUNITY): Payer: Self-pay

## 2012-09-07 DIAGNOSIS — Z853 Personal history of malignant neoplasm of breast: Secondary | ICD-10-CM | POA: Insufficient documentation

## 2012-09-07 DIAGNOSIS — L299 Pruritus, unspecified: Secondary | ICD-10-CM

## 2012-09-07 DIAGNOSIS — I2510 Atherosclerotic heart disease of native coronary artery without angina pectoris: Secondary | ICD-10-CM | POA: Insufficient documentation

## 2012-09-07 DIAGNOSIS — Z79899 Other long term (current) drug therapy: Secondary | ICD-10-CM | POA: Insufficient documentation

## 2012-09-07 DIAGNOSIS — I129 Hypertensive chronic kidney disease with stage 1 through stage 4 chronic kidney disease, or unspecified chronic kidney disease: Secondary | ICD-10-CM | POA: Insufficient documentation

## 2012-09-07 DIAGNOSIS — I509 Heart failure, unspecified: Secondary | ICD-10-CM | POA: Insufficient documentation

## 2012-09-07 DIAGNOSIS — Z87891 Personal history of nicotine dependence: Secondary | ICD-10-CM | POA: Insufficient documentation

## 2012-09-07 DIAGNOSIS — I252 Old myocardial infarction: Secondary | ICD-10-CM | POA: Insufficient documentation

## 2012-09-07 DIAGNOSIS — N189 Chronic kidney disease, unspecified: Secondary | ICD-10-CM | POA: Insufficient documentation

## 2012-09-07 DIAGNOSIS — E785 Hyperlipidemia, unspecified: Secondary | ICD-10-CM | POA: Insufficient documentation

## 2012-09-07 DIAGNOSIS — Z794 Long term (current) use of insulin: Secondary | ICD-10-CM | POA: Insufficient documentation

## 2012-09-07 DIAGNOSIS — E119 Type 2 diabetes mellitus without complications: Secondary | ICD-10-CM | POA: Insufficient documentation

## 2012-09-07 DIAGNOSIS — R21 Rash and other nonspecific skin eruption: Secondary | ICD-10-CM

## 2012-09-07 MED ORDER — HYDROXYZINE HCL 25 MG PO TABS: 25.0000 mg | ORAL_TABLET | Freq: Four times a day (QID) | ORAL | Status: DC | PRN

## 2012-09-07 MED ORDER — HYDROXYZINE HCL 25 MG PO TABS
50.0000 mg | ORAL_TABLET | Freq: Once | ORAL | Status: AC
  Administered 2012-09-07: 50 mg via ORAL
  Filled 2012-09-07: qty 2

## 2012-09-07 NOTE — ED Notes (Signed)
intermittent itching of lower legs for 2 weeks. No known cause

## 2012-09-07 NOTE — ED Provider Notes (Signed)
History  This chart was scribed for Alexis Blade, MD by Frederich Balding, ED Scribe and [name], ED Scribe. This patient was seen in room APA07/APA07 and the patient's care was started at 8:51 PM.   CSN: [id number]  Arrival date & time 09/07/12  2003    Chief Complaint  Patient presents with  . Pruritis     The history is provided by the patient. No language interpreter was used.    Alexis [name] is a 67 y.o. female who presents to the Emergency Department complaining of intermittent episodes of pruritis of lower extremities since 3 weeks ago. Pt states she has put cream on her legs but it did not help. She denies trying OTC pills for her symptoms. She states she has an appointment on September 11, 2012 at [facility] with a dermatologist for her symptoms. Pt denies back pain, dizziness, urination problems, chest pain, abdominal pain, fever, and cough.    Past Medical History  Diagnosis Date  . Diabetes mellitus   . Hypertension   . Morbid obesity   . Coronary artery disease   . Chest pain   . 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 Hyperlipidemia   . Hx of breast cancer   . CHF (congestive heart failure)   . SOB (shortness of breath)   . Chronic kidney disease   . Cancer 2005     left breast    Past Surgical History  Procedure Laterality Date  . Back surgery    . Tubal ligation    . Transthoracic echocardiogram  10/01/2010     Left ventricle: The cavity size was mildly dilated. Wall thickness was increased in a pattern of mild LVH. Systolic function was   mildly reduced. The estimated ejection fraction was in the range  of 45% to 50%.   . Coronary angioplasty  12/19/2003    inferior wall hypokinesis. ef 50%  . Arm surgery      Left arm trauma  . Mastectomy      Left    Family History  Problem Relation Age of Onset  . Breast cancer Mother     Died age 51    History  Substance Use Topics  . Smoking status: Former Smoker -- 1.00 packs/day for 30 years    Types:  Cigarettes    Quit date: 05/16/2002  . Smokeless tobacco: Never Used  . Alcohol Use: No    OB History   Grav Para Term Preterm Abortions TAB SAB Ect Mult Living                  Review of Systems  Constitutional: Negative for fever.  Respiratory: Negative for cough.   Cardiovascular: Negative for chest pain.  Genitourinary: Negative for difficulty urinating.  Musculoskeletal: Negative for back pain.  Neurological: Negative for dizziness.  All other systems reviewed and are negative.    Allergies  Review of patient's allergies indicates no known allergies.  Home Medications   Current Outpatient Rx  Name  Route  Sig  Dispense  Refill  . amLODipine (NORVASC) 10 MG tablet   Oral   Take 10 mg by mouth every morning.          . carvedilol (COREG) 6.25 MG tablet   Oral   Take 1 tablet (6.25 mg total) by mouth 2 (two) times daily.   60 tablet   1   . furosemide (LASIX) 40 MG tablet   Oral  Take 40 mg by mouth every morning.         . insulin aspart protamine-insulin aspart (NOVOLOG 70/30) (70-30) 100 UNIT/ML injection   Subcutaneous   Inject 30-60 Units into the skin 2 (two) times daily with a meal. 60 UNITS IN THE MORNING AND 30 UNITS AT BEDTIME         . loratadine (CLARITIN) 10 MG tablet   Oral   Take 10 mg by mouth every morning.          . metoprolol (LOPRESSOR) 50 MG tablet   Oral   Take 50 mg by mouth every morning.          . olmesartan (BENICAR) 20 MG tablet   Oral   Take 20 mg by mouth every morning.          . potassium chloride (K-DUR) 10 MEQ tablet   Oral   Take 10 mEq by mouth every morning.          . rosuvastatin (CRESTOR) 10 MG tablet   Oral   Take 10 mg by mouth every morning.          . hydrOXYzine (ATARAX/VISTARIL) 25 MG tablet   Oral   Take 1 tablet (25 mg total) by mouth every 6 (six) hours as needed for itching.   30 tablet   0   . nitroGLYCERIN (NITROSTAT) 0.4 MG SL tablet   Sublingual   Place 0.4 mg under  the tongue every 5 (five) minutes as needed for chest pain.           Triage Vitals: BP 163/80  Pulse 101  Temp(Src) 98.7 F (37.1 C) (Oral)  Resp 20  Ht 5\' 5"  (1.651 m)  Wt 278 lb 6.4 oz (126.281 kg)  BMI 46.33 kg/m2  SpO2 99%  Physical Exam  Nursing note and vitals reviewed. Constitutional: She is oriented to person, place, and time. She appears well-developed and well-nourished.  HENT:  Head: Normocephalic and atraumatic.  Eyes: Conjunctivae and EOM are normal. Pupils are equal, round, and reactive to light.  Neck: Normal range of motion and phonation normal. Neck supple.  Cardiovascular: Normal rate, regular rhythm and intact distal pulses.   Pulmonary/Chest: Effort normal and breath sounds normal. She exhibits no tenderness.  Abdominal: Soft. She exhibits no distension. There is no tenderness. There is no guarding.  Musculoskeletal: Normal range of motion.  3+ edema bilaterally on lower extremities.   Neurological: She is alert and oriented to person, place, and time. She has normal strength. She exhibits normal muscle tone.  Skin: Skin is warm and dry.  Indistinct rash of the anterior lower legs raised in areas excoriated. A few areas of hyperpigmented lesions that are indistinct in appearance. No redness, drainage, or fluctuance. No petechiae.   Psychiatric: She has a normal mood and affect. Her behavior is normal. Judgment and thought content normal.    ED Course  Procedures (including critical care time)  DIAGNOSTIC STUDIES: Oxygen Saturation is 99% on RA, normal by my interpretation.    COORDINATION OF CARE: 9:07 PM-Discussed treatment plan which includes Atarax with pt at bedside and pt agreed to plan.   10:15 PM- Pt rechecked and feels improved. Discussed discharge plan with pt and pt agreed.  Plan: Home Medications- Atarax; Home Treatments- take atarax as prescribed; Recommended follow up- with dermatologist as scheduled        1. Rash   2. Itching        MDM  Nursing Notes  Reviewed/ Care Coordinated, and agree without changes. Applicable Imaging Reviewed.  Interpretation of Laboratory Data incorporated into ED treatment  Nonspecific rash, subacute, cause unclear. Patient has marked pruritis, improved with Atarax. Doubt vasculitis, drug reaction, systemic illness. Doubt metabolic instability, serious bacterial infection or impending vascular collapse; the patient is stable for discharge.      I personally performed the services described in this documentation, which was scribed in my presence. The recorded information has been reviewed and is accurate.     Alexis Blade, MD 09/07/12 [phone]

## 2012-09-11 ENCOUNTER — Other Ambulatory Visit (INDEPENDENT_AMBULATORY_CARE_PROVIDER_SITE_OTHER): Payer: Medicare Other

## 2012-09-11 ENCOUNTER — Encounter: Payer: Self-pay | Admitting: Cardiovascular Disease

## 2012-09-11 ENCOUNTER — Ambulatory Visit (INDEPENDENT_AMBULATORY_CARE_PROVIDER_SITE_OTHER): Payer: Medicare Other | Admitting: Cardiovascular Disease

## 2012-09-11 VITALS — BP 150/100 | HR 107 | Ht 65.0 in | Wt 278.0 lb

## 2012-09-11 DIAGNOSIS — R0989 Other specified symptoms and signs involving the circulatory and respiratory systems: Secondary | ICD-10-CM

## 2012-09-11 DIAGNOSIS — I509 Heart failure, unspecified: Secondary | ICD-10-CM

## 2012-09-11 MED ORDER — FUROSEMIDE 40 MG PO TABS: ORAL_TABLET | ORAL | Status: DC

## 2012-09-11 MED ORDER — POTASSIUM CHLORIDE ER 10 MEQ PO TBCR: EXTENDED_RELEASE_TABLET | ORAL | Status: DC

## 2012-09-11 MED ORDER — CARVEDILOL 12.5 MG PO TABS: 12.5000 mg | ORAL_TABLET | Freq: Two times a day (BID) | ORAL | Status: DC

## 2012-09-11 NOTE — Assessment & Plan Note (Signed)
Alexis [name]  continues to have problems with congestive heart failure and fluid retention.  Left ventricular systolic function is only mildly depressed with an EF of 40-45%. She does have some diastolic dysfunction. She has chronic kidney disease.  She says that she's eating a low salt diet now. We'll increase her Lasix to 40 mg twice a day to see if we can diurese her better. I have recommended that she elevate her legs several times through the day for 30 minutes each time.    If she doesn't make significant progress soon, we will consider referring her to the Advanced Heart Failure clinic

## 2012-09-11 NOTE — Patient Instructions (Addendum)
Increase Lasix to 40 mg twice a day  Increase KDur to 10 meq twice a day   Increase Coreg to 12.5 mg twice a day   Lab work ( Bmet ) Tuesday 10/02/12 you may eat 7:30 am to 5:00 pm.  Elevate legs  Your physician recommends that you schedule a follow-up appointment in: 3 months with a Bmet

## 2012-09-11 NOTE — Progress Notes (Signed)
Loney Laurence Date of Birth  [birth year] [facility] [address]    [place], [place]  [address] [phone]  Fax  [phone]  Problem list 1. Coronary artery disease-status post stent placement in 2005 2. Congestive heart failure-ejection fraction 45-50% 3. Chronic kidney disease 4. Hypertension 5. Diabetes mellitus 6. Hyperlipidemia  History of Present Illness:  Alexis [name] is a 67 year old female. She has a history of coronary artery disease. She has a stent placed in [place]  around 2005.  She's done well since that time. She's not had any episodes of chest pain. She did have some anxiety associated with some stress from her brother being in the hospital.  She was hospitalized for 2 days because of this chest pain. She ruled out for myocardial infarction. She was scheduled to have an outpatient stress test but she could not do it because of pulmonary congestion and leg pain.  Said she slept the hospital she's not had any recurrent episodes of chest pain. She thinks that it was all just due to stress. She's not having any episodes of chest pain now.  He also has a history of renal insufficiency. She sees Dr. Edrick Oh. She was recently started on amlodipine for hypertension.  Her last creatinine was 2.71.  09/11/2012  She was seen by Margarite Gouge earlier this month for some episodes of shortness of breath. Repeat echocardiogram revealed:  - Left ventricle: The cavity size was mildly dilated. Wall thickness was increased in a pattern of moderate LVH. Systolic function was mildly to moderately reduced. The estimated ejection fraction was in the range of 40% to 45%. Diffuse hypokinesis. There is moderate hypokinesis of the inferior myocardium. Features are consistent with a pseudonormal left ventricular filling pattern, with concomitant abnormal relaxation and increased filling pressure (grade 2 diastolic dysfunction). - Mitral valve: Mild  regurgitation. - Left atrium: The atrium was moderately dilated. - Right ventricle: The cavity size was mildly dilated. - Right atrium: The atrium was moderately dilated. - Atrial septum: No defect or patent foramen ovale was identified. - Tricuspid valve: Moderate regurgitation. - Pulmonary arteries: PA peak pressure: 64mm Hg  She continues to have episodes of shortness breath particularly with exertion. She has dyspneic going from one room to the . She's not eating any canned  or salty foods.   She has gained weight.  She was initially started on Lasix 40 twice a day and felt better. When she reduced her dose of 40 mg once a day she started having more shortness of breath again.   Current Outpatient Prescriptions on File Prior to Visit  Medication Sig Dispense Refill  . amLODipine (NORVASC) 10 MG tablet Take 10 mg by mouth every morning.       . carvedilol (COREG) 6.25 MG tablet Take 1 tablet (6.25 mg total) by mouth 2 (two) times daily.  60 tablet  1  . furosemide (LASIX) 40 MG tablet Take 40 mg by mouth every morning.      . hydrOXYzine (ATARAX/VISTARIL) 25 MG tablet Take 1 tablet (25 mg total) by mouth every 6 (six) hours as needed for itching.  30 tablet  0  . insulin aspart protamine-insulin aspart (NOVOLOG 70/30) (70-30) 100 UNIT/ML injection Inject 30-60 Units into the skin 2 (two) times daily with a meal. 60 UNITS IN THE MORNING AND 30 UNITS AT BEDTIME      . loratadine (CLARITIN) 10 MG tablet Take 10 mg by mouth every morning.       Marland Kitchen  metoprolol (LOPRESSOR) 50 MG tablet Take 50 mg by mouth every morning.       . nitroGLYCERIN (NITROSTAT) 0.4 MG SL tablet Place 0.4 mg under the tongue every 5 (five) minutes as needed for chest pain.      Marland Kitchen olmesartan (BENICAR) 20 MG tablet Take 20 mg by mouth every morning.       . potassium chloride (K-DUR) 10 MEQ tablet Take 10 mEq by mouth every morning.       . rosuvastatin (CRESTOR) 10 MG tablet Take 10 mg by mouth every morning.        No  current facility-administered medications on file prior to visit.    No Known Allergies  Past Medical History  Diagnosis Date  . Diabetes mellitus   . Hypertension   . Morbid obesity   . Coronary artery disease   . Chest pain   . 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 Hyperlipidemia   . Hx of breast cancer   . CHF (congestive heart failure)   . SOB (shortness of breath)   . Chronic kidney disease   . Cancer 2005     left breast    Past Surgical History  Procedure Laterality Date  . Back surgery    . Tubal ligation    . Transthoracic echocardiogram  10/01/2010     Left ventricle: The cavity size was mildly dilated. Wall thickness was increased in a pattern of mild LVH. Systolic function was   mildly reduced. The estimated ejection fraction was in the range  of 45% to 50%.   . Coronary angioplasty  12/19/2003    inferior wall hypokinesis. ef 50%  . Arm surgery      Left arm trauma  . Mastectomy      Left    History  Smoking status  . Former Smoker -- 1.00 packs/day for 30 years  . Types: Cigarettes  . Quit date: 05/16/2002  Smokeless tobacco  . Never Used    History  Alcohol Use No    Family History  Problem Relation Age of Onset  . Breast cancer Mother     Died age 50    Reviw of Systems:  Reviewed in the HPI.  All other systems are negative.  Physical Exam: BP 150/100  Pulse 107  Ht 5\' 5"  (1.651 m)  Wt 278 lb (126.1 kg)  BMI 46.26 kg/m2  SpO2 95% The patient is alert and oriented x 3.  The mood and affect are normal.   Skin: warm and dry.  Color is normal.    HEENT:   Normocephalic/atraumatic. The mucous membranes are moist. There is no JVD.  Lungs: Her lung exam is clear.   Heart: Regular rate S1-S2    Abdomen: Good bowel sounds. There is no hepatosplenomegaly.  She is obese.  Extremities:  She is no clubbing cyanosis or edema  Neuro:  There exam is nonfocal    ECG:   Assessment / Plan:

## 2012-10-02 ENCOUNTER — Other Ambulatory Visit: Payer: Medicare Other

## 2012-12-02 ENCOUNTER — Emergency Department (HOSPITAL_COMMUNITY): Payer: Medicare Other

## 2012-12-02 ENCOUNTER — Encounter (HOSPITAL_COMMUNITY): Payer: Self-pay | Admitting: Emergency Medicine

## 2012-12-02 ENCOUNTER — Emergency Department (HOSPITAL_COMMUNITY)
Admission: EM | Admit: 2012-12-02 | Discharge: 2012-12-02 | Disposition: A | Discharge status: Home / Self Care | Payer: Medicare Other | Attending: Emergency Medicine | Admitting: Emergency Medicine

## 2012-12-02 DIAGNOSIS — Z794 Long term (current) use of insulin: Secondary | ICD-10-CM | POA: Insufficient documentation

## 2012-12-02 DIAGNOSIS — Z853 Personal history of malignant neoplasm of breast: Secondary | ICD-10-CM | POA: Insufficient documentation

## 2012-12-02 DIAGNOSIS — IMO0002 Reserved for concepts with insufficient information to code with codable children: Secondary | ICD-10-CM | POA: Insufficient documentation

## 2012-12-02 DIAGNOSIS — Y92009 Unspecified place in unspecified non-institutional (private) residence as the place of occurrence of the external cause: Secondary | ICD-10-CM | POA: Insufficient documentation

## 2012-12-02 DIAGNOSIS — I2510 Atherosclerotic heart disease of native coronary artery without angina pectoris: Secondary | ICD-10-CM | POA: Insufficient documentation

## 2012-12-02 DIAGNOSIS — S8012XA Contusion of left lower leg, initial encounter: Secondary | ICD-10-CM

## 2012-12-02 DIAGNOSIS — Z23 Encounter for immunization: Secondary | ICD-10-CM | POA: Insufficient documentation

## 2012-12-02 DIAGNOSIS — N189 Chronic kidney disease, unspecified: Secondary | ICD-10-CM | POA: Insufficient documentation

## 2012-12-02 DIAGNOSIS — Y9301 Activity, walking, marching and hiking: Secondary | ICD-10-CM | POA: Insufficient documentation

## 2012-12-02 DIAGNOSIS — E119 Type 2 diabetes mellitus without complications: Secondary | ICD-10-CM | POA: Insufficient documentation

## 2012-12-02 DIAGNOSIS — I252 Old myocardial infarction: Secondary | ICD-10-CM | POA: Insufficient documentation

## 2012-12-02 DIAGNOSIS — Z87891 Personal history of nicotine dependence: Secondary | ICD-10-CM | POA: Insufficient documentation

## 2012-12-02 DIAGNOSIS — Z79899 Other long term (current) drug therapy: Secondary | ICD-10-CM | POA: Insufficient documentation

## 2012-12-02 DIAGNOSIS — S8010XA Contusion of unspecified lower leg, initial encounter: Secondary | ICD-10-CM | POA: Insufficient documentation

## 2012-12-02 DIAGNOSIS — E785 Hyperlipidemia, unspecified: Secondary | ICD-10-CM | POA: Insufficient documentation

## 2012-12-02 DIAGNOSIS — I129 Hypertensive chronic kidney disease with stage 1 through stage 4 chronic kidney disease, or unspecified chronic kidney disease: Secondary | ICD-10-CM | POA: Insufficient documentation

## 2012-12-02 DIAGNOSIS — R296 Repeated falls: Secondary | ICD-10-CM | POA: Insufficient documentation

## 2012-12-02 DIAGNOSIS — W19XXXA Unspecified fall, initial encounter: Secondary | ICD-10-CM

## 2012-12-02 DIAGNOSIS — Z9861 Coronary angioplasty status: Secondary | ICD-10-CM | POA: Insufficient documentation

## 2012-12-02 MED ORDER — TETANUS-DIPHTH-ACELL PERTUSSIS 5-2.5-18.5 LF-MCG/0.5 IM SUSP
0.5000 mL | Freq: Once | INTRAMUSCULAR | Status: AC
  Administered 2012-12-02: 0.5 mL via INTRAMUSCULAR
  Filled 2012-12-02: qty 0.5

## 2012-12-02 MED ORDER — OXYCODONE-ACETAMINOPHEN 5-325 MG PO TABS
1.0000 | ORAL_TABLET | Freq: Once | ORAL | Status: AC
  Administered 2012-12-02: 1 via ORAL
  Filled 2012-12-02: qty 1

## 2012-12-02 MED ORDER — OXYCODONE-ACETAMINOPHEN 5-325 MG PO TABS: 1.0000 | ORAL_TABLET | ORAL | Status: DC | PRN

## 2012-12-02 NOTE — ED Provider Notes (Signed)
History    CSN: [id number] Arrival date & time 12/02/12  0155  First MD Initiated Contact with Patient 12/02/12 [phone]     Chief Complaint  Patient presents with  . Leg Pain   (Consider location/radiation/quality/duration/timing/severity/associated sxs/prior Treatment) The history is provided by the patient.   67 year old female states that she tried to stand up and her left leg just gave out on her and she fell injuring her left leg. The pain is moderately severe and she rates it an 8/10. She suffered an abrasion to her knee and is complaining of pain from the ankle all that to just superior to the knee. She denies other injury. She does not on her last tetanus immunization was. Past Medical History  Diagnosis Date  . Diabetes mellitus   . Hypertension   . Morbid obesity   . Coronary artery disease   . Chest pain   . 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 Hyperlipidemia   . Hx of breast cancer   . CHF (congestive heart failure)   . SOB (shortness of breath)   . Chronic kidney disease   . Cancer 2005     left breast   Past Surgical History  Procedure Laterality Date  . Back surgery    . Tubal ligation    . Transthoracic echocardiogram  10/01/2010     Left ventricle: The cavity size was mildly dilated. Wall thickness was increased in a pattern of mild LVH. Systolic function was   mildly reduced. The estimated ejection fraction was in the range  of 45% to 50%.   . Coronary angioplasty  12/19/2003    inferior wall hypokinesis. ef 50%  . Arm surgery      Left arm trauma  . Mastectomy      Left   Family History  Problem Relation Age of Onset  . Breast cancer Mother     Died age 70   History  Substance Use Topics  . Smoking status: Former Smoker -- 1.00 packs/day for 30 years    Types: Cigarettes    Quit date: 05/16/2002  . Smokeless tobacco: Never Used  . Alcohol Use: No   OB History   Grav Para Term Preterm Abortions TAB SAB Ect Mult Living                 Review of  Systems  All other systems reviewed and are negative.    Allergies  Review of patient's allergies indicates no known allergies.  Home Medications   Current Outpatient Rx  Name  Route  Sig  Dispense  Refill  . amLODipine (NORVASC) 10 MG tablet   Oral   Take 10 mg by mouth every morning.          . carvedilol (COREG) 12.5 MG tablet   Oral   Take 1 tablet (12.5 mg total) by mouth 2 (two) times daily.   60 tablet   6   . furosemide (LASIX) 40 MG tablet      Take 40 mg twice a day   60 tablet   6   . hydrOXYzine (ATARAX/VISTARIL) 25 MG tablet   Oral   Take 1 tablet (25 mg total) by mouth every 6 (six) hours as needed for itching.   30 tablet   0   . insulin aspart protamine-insulin aspart (NOVOLOG 70/30) (70-30) 100 UNIT/ML injection   Subcutaneous   Inject 30-60 Units into the skin 2 (two) times daily with a meal.  60 UNITS IN THE MORNING AND 30 UNITS AT BEDTIME         . loratadine (CLARITIN) 10 MG tablet   Oral   Take 10 mg by mouth every morning.          . metoprolol (LOPRESSOR) 50 MG tablet   Oral   Take 50 mg by mouth every morning.          . nitroGLYCERIN (NITROSTAT) 0.4 MG SL tablet   Sublingual   Place 0.4 mg under the tongue every 5 (five) minutes as needed for chest pain.         Marland Kitchen olmesartan (BENICAR) 20 MG tablet   Oral   Take 20 mg by mouth every morning.          . potassium chloride (K-DUR) 10 MEQ tablet      Take 10 meq twice a day   60 tablet   6   . rosuvastatin (CRESTOR) 10 MG tablet   Oral   Take 10 mg by mouth every morning.           BP 159/73  Pulse 83  Temp(Src) 98.5 F (36.9 C) (Oral)  Resp 20  Ht 5\' 6"  (1.676 m)  Wt 265 lb (120.203 kg)  BMI 42.79 kg/m2  SpO2 95% Physical Exam  Nursing note and vitals reviewed.  67 year old female, resting comfortably and in no acute distress. Vital signs are significant for hypertension with blood pressure 159/73. Oxygen saturation is 95%, which is normal. Head is  normocephalic and atraumatic. PERRLA, EOMI. Oropharynx is clear. Neck is nontender and supple without adenopathy or JVD. Back is nontender and there is no CVA tenderness. Lungs are clear without rales, wheezes, or rhonchi. Chest is nontender. Heart has regular rate and rhythm without murmur. Abdomen is soft, flat, nontender without masses or hepatosplenomegaly and peristalsis is normoactive. Extremities: There is abrasion over the anterior aspect of the left knee. There is no swelling or deformity and no effusion present. There is tenderness palpation from just superior to the left knee also again to just superior to the more light on the left. There is no instability of any her ankle. Distal neurovascular exam is intact with strong pulses, prompt capillary refill, normal sensation. Skin is warm and dry without rash. Neurologic: Mental status is normal, cranial nerves are intact, there are no motor or sensory deficits.  ED Course  Procedures (including critical care time) Results for orders placed during the hospital encounter of 08/19/12  URINE CULTURE      Result Value Range   Specimen Description URINE, CLEAN CATCH     Special Requests NONE     Culture  Setup Time 08/19/2012 21:16     Colony Count >=100,000 COLONIES/ML     Culture ESCHERICHIA COLI     Report Status 08/21/2012 FINAL     Organism ID, Bacteria ESCHERICHIA COLI    CBC      Result Value Range   WBC 8.9  4.0 - 10.5 K/uL   RBC 4.69  3.87 - 5.11 MIL/uL   Hemoglobin 10.7 (*) 12.0 - 15.0 g/dL   HCT 32.5 (*) 36.0 - 46.0 %   MCV 69.3 (*) 78.0 - 100.0 fL   MCH 22.8 (*) 26.0 - 34.0 pg   MCHC 32.9  30.0 - 36.0 g/dL   RDW 18.5 (*) 11.5 - 15.5 %   Platelets 239  150 - 400 K/uL  PRO B NATRIURETIC PEPTIDE      Result  Value Range   Pro B Natriuretic peptide (BNP) 11358.0 (*) 0 - 125 pg/mL  BASIC METABOLIC PANEL      Result Value Range   Sodium 137  135 - 145 mEq/L   Potassium 4.8  3.5 - 5.1 mEq/L   Chloride 106  96 - 112  mEq/L   CO2 21  19 - 32 mEq/L   Glucose, Bld 194 (*) 70 - 99 mg/dL   BUN 40 (*) 6 - 23 mg/dL   Creatinine, Ser 2.67 (*) 0.50 - 1.10 mg/dL   Calcium 8.9  8.4 - 10.5 mg/dL   GFR calc non Af Amer 18 (*) >90 mL/min   GFR calc Af Amer 20 (*) >90 mL/min  D-DIMER, QUANTITATIVE      Result Value Range   D-Dimer, Quant 2.24 (*) 0.00 - 0.48 ug/mL-FEU  URINALYSIS, ROUTINE W REFLEX MICROSCOPIC      Result Value Range   Color, Urine YELLOW  YELLOW   APPearance CLOUDY (*) CLEAR   Specific Gravity, Urine 1.018  1.005 - 1.030   pH 5.0  5.0 - 8.0   Glucose, UA NEGATIVE  NEGATIVE mg/dL   Hgb urine dipstick SMALL (*) NEGATIVE   Bilirubin Urine NEGATIVE  NEGATIVE   Ketones, ur NEGATIVE  NEGATIVE mg/dL   Protein, ur >300 (*) NEGATIVE mg/dL   Urobilinogen, UA 0.2  0.0 - 1.0 mg/dL   Nitrite NEGATIVE  NEGATIVE   Leukocytes, UA SMALL (*) NEGATIVE  URINE MICROSCOPIC-ADD ON      Result Value Range   Squamous Epithelial / LPF RARE  RARE   WBC, UA 11-20  <3 WBC/hpf   Bacteria, UA MANY (*) RARE  POCT I-STAT TROPONIN I      Result Value Range   Troponin i, poc 0.01  0.00 - 0.08 ng/mL   Comment 3            Dg Hip Complete Left  12/02/2012   *RADIOLOGY REPORT*  Clinical Data: Fall, left leg pain unable to bear weight  LEFT HIP - COMPLETE 2+ VIEW  Comparison: Concurrently obtained radiographs of the left knee and lower leg  Findings: Slightly limited evaluation secondary to patient body habitus.  No displaced fracture or malalignment.  There is a small osseous fragment superior to the greater trochanter which likely reflects ossification in the gluteal insertion, however a small avulsion fracture is difficult to exclude an appropriate clinical setting.  Bony mineralization is within normal limits.  No aggressive lytic or blastic osseous lesion.  The bony pelvis appears intact. Superolateral joint space narrowing and osteophyte formation bilaterally consistent with moderate osteoarthritis.  IMPRESSION:  1.  No  definite displaced fracture or malalignment. Evaluation slightly limited by patient body habitus.  2.  Small osseous fragment seen compared to the left greater trochanter likely reflects ossification in the gluteal tendinous insertion.  A small avulsion fracture is difficult to exclude entirely. MRI of the pelvis could further evaluate and also evaluate for other occult fracture.  3.  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   Original Report Authenticated By: Jacqulynn Cadet, M.D.   Dg Tibia/fibula Left  12/02/2012   *RADIOLOGY REPORT*  Clinical Data: Status post fall; left lower leg pain.  LEFT TIBIA AND FIBULA - 2 VIEW  Comparison: None.  Findings: There is no evidence of fracture or dislocation.  The tibia and fibula appear intact.  Marginal osteophytes are seen arising at the medial compartment.  The ankle mortise is incompletely assessed, but appears grossly  unremarkable. Enthesophytes are noted arising at the superior and inferior poles of the patella.  A large posterior calcaneal spur is incidentally seen.  A small to moderate knee joint effusion is noted.  No additional soft tissue abnormalities are characterized on radiograph.  IMPRESSION:  1.  No evidence of fracture or dislocation. 2.  Small to moderate knee joint effusion is noted.   Original Report Authenticated By: Santa Lighter, M.D.   Dg Knee Complete 4 Views Left  12/02/2012   *RADIOLOGY REPORT*  Clinical Data: Status post fall; left anterior knee pain.  LEFT KNEE - COMPLETE 4+ VIEW  Comparison: None.  Findings: There is no evidence of fracture or dislocation.  The joint spaces are preserved.  Marginal osteophytes are noted arising at the medial compartment, and at the tibial spine.  Enthesophytes are seen at the superior and inferior poles of the patella, and there is mild chronic irregularity involving the tibial tubercle.  A small to moderate knee joint effusion is noted.  Scattered vascular calcifications are seen.   IMPRESSION:  1.  No evidence of fracture or dislocation. 2.  Small to moderate knee joint effusion noted. 3.  Scattered vascular calcifications seen. 4.  Minimal degenerative change at the medial compartment.   Original Report Authenticated By: Santa Lighter, M.D.     1. Fall at home, initial encounter   2. Contusion of left knee and lower leg     MDM  Fall with injury of left knee and left leg. She'll be sent for x-rays. TDaP booster is given.  Initial x-rays are negative but she is still unable to ambulate. Therefore, and hip x-ray will be obtained to rule out occult hip fracture.  Hip x-ray is negative. You'll be placed in knee immobilizer and discharged. She states she does have a walker at home and she is advised to use that as needed. Follow up with her PCP in the next several days. She's given a prescription for oxycodone-acetaminophen.  Delora Fuel, [place] [id number]

## 2012-12-02 NOTE — ED Notes (Signed)
MD at bedside. 

## 2012-12-02 NOTE — ED Notes (Signed)
Per EMS: pt was walking and "leg gave out" fell to left side on concrete. No obvious deformity positive for abrasion to knee and upper shin. C/o lower left leg pain.

## 2012-12-02 NOTE — ED Notes (Signed)
Patient assisted with bedside commode back to bed. 2 staff assist.

## 2012-12-12 ENCOUNTER — Ambulatory Visit: Payer: Medicare Other | Admitting: Cardiovascular Disease

## 2013-01-08 DIAGNOSIS — E079 Disorder of thyroid, unspecified: Secondary | ICD-10-CM

## 2013-01-08 DIAGNOSIS — E1121 Type 2 diabetes mellitus with diabetic nephropathy: Secondary | ICD-10-CM

## 2013-01-08 DIAGNOSIS — E785 Hyperlipidemia, unspecified: Secondary | ICD-10-CM

## 2013-01-08 DIAGNOSIS — R809 Proteinuria, unspecified: Secondary | ICD-10-CM

## 2013-01-08 HISTORY — DX: Hyperlipidemia, unspecified: E78.5

## 2013-01-08 HISTORY — DX: Type 2 diabetes mellitus with diabetic nephropathy: E11.21

## 2013-01-08 HISTORY — DX: Disorder of thyroid, unspecified: E07.9

## 2013-01-08 HISTORY — DX: Proteinuria, unspecified: R80.9

## 2013-01-15 ENCOUNTER — Other Ambulatory Visit: Payer: Self-pay

## 2013-01-15 ENCOUNTER — Encounter: Payer: Self-pay | Admitting: Vascular Surgery

## 2013-01-15 DIAGNOSIS — Z01810 Encounter for preprocedural cardiovascular examination: Secondary | ICD-10-CM

## 2013-01-15 DIAGNOSIS — N184 Chronic kidney disease, stage 4 (severe): Secondary | ICD-10-CM

## 2013-01-31 ENCOUNTER — Encounter: Payer: Self-pay | Admitting: Vascular Surgery

## 2013-02-01 ENCOUNTER — Ambulatory Visit: Payer: Medicare Other | Admitting: Vascular Surgery

## 2013-02-01 ENCOUNTER — Emergency Department (HOSPITAL_COMMUNITY): Payer: Medicare Other

## 2013-02-01 ENCOUNTER — Emergency Department (HOSPITAL_COMMUNITY)
Admission: EM | Admit: 2013-02-01 | Discharge: 2013-02-01 | Disposition: A | Discharge status: Home / Self Care | Payer: Medicare Other | Attending: Emergency Medicine | Admitting: Emergency Medicine

## 2013-02-01 ENCOUNTER — Encounter (HOSPITAL_COMMUNITY): Payer: Self-pay | Admitting: Emergency Medicine

## 2013-02-01 DIAGNOSIS — Z8639 Personal history of other endocrine, nutritional and metabolic disease: Secondary | ICD-10-CM | POA: Insufficient documentation

## 2013-02-01 DIAGNOSIS — N189 Chronic kidney disease, unspecified: Secondary | ICD-10-CM | POA: Insufficient documentation

## 2013-02-01 DIAGNOSIS — I2510 Atherosclerotic heart disease of native coronary artery without angina pectoris: Secondary | ICD-10-CM | POA: Insufficient documentation

## 2013-02-01 DIAGNOSIS — I509 Heart failure, unspecified: Secondary | ICD-10-CM | POA: Insufficient documentation

## 2013-02-01 DIAGNOSIS — Z87891 Personal history of nicotine dependence: Secondary | ICD-10-CM | POA: Insufficient documentation

## 2013-02-01 DIAGNOSIS — E1129 Type 2 diabetes mellitus with other diabetic kidney complication: Secondary | ICD-10-CM | POA: Insufficient documentation

## 2013-02-01 DIAGNOSIS — R5381 Other malaise: Secondary | ICD-10-CM | POA: Insufficient documentation

## 2013-02-01 DIAGNOSIS — Z87440 Personal history of urinary (tract) infections: Secondary | ICD-10-CM | POA: Insufficient documentation

## 2013-02-01 DIAGNOSIS — X500XXA Overexertion from strenuous movement or load, initial encounter: Secondary | ICD-10-CM | POA: Insufficient documentation

## 2013-02-01 DIAGNOSIS — Z862 Personal history of diseases of the blood and blood-forming organs and certain disorders involving the immune mechanism: Secondary | ICD-10-CM | POA: Insufficient documentation

## 2013-02-01 DIAGNOSIS — Y939 Activity, unspecified: Secondary | ICD-10-CM | POA: Insufficient documentation

## 2013-02-01 DIAGNOSIS — IMO0002 Reserved for concepts with insufficient information to code with codable children: Secondary | ICD-10-CM | POA: Insufficient documentation

## 2013-02-01 DIAGNOSIS — Y929 Unspecified place or not applicable: Secondary | ICD-10-CM | POA: Insufficient documentation

## 2013-02-01 DIAGNOSIS — W19XXXA Unspecified fall, initial encounter: Secondary | ICD-10-CM

## 2013-02-01 DIAGNOSIS — I129 Hypertensive chronic kidney disease with stage 1 through stage 4 chronic kidney disease, or unspecified chronic kidney disease: Secondary | ICD-10-CM | POA: Insufficient documentation

## 2013-02-01 DIAGNOSIS — M25562 Pain in left knee: Secondary | ICD-10-CM

## 2013-02-01 DIAGNOSIS — Z853 Personal history of malignant neoplasm of breast: Secondary | ICD-10-CM | POA: Insufficient documentation

## 2013-02-01 DIAGNOSIS — N058 Unspecified nephritic syndrome with other morphologic changes: Secondary | ICD-10-CM | POA: Insufficient documentation

## 2013-02-01 DIAGNOSIS — Z79899 Other long term (current) drug therapy: Secondary | ICD-10-CM | POA: Insufficient documentation

## 2013-02-01 DIAGNOSIS — I252 Old myocardial infarction: Secondary | ICD-10-CM | POA: Insufficient documentation

## 2013-02-01 DIAGNOSIS — S8990XA Unspecified injury of unspecified lower leg, initial encounter: Secondary | ICD-10-CM | POA: Insufficient documentation

## 2013-02-01 MED ORDER — HYDROCODONE-ACETAMINOPHEN 5-325 MG PO TABS
2.0000 | ORAL_TABLET | Freq: Once | ORAL | Status: AC
  Administered 2013-02-01: 2 via ORAL
  Filled 2013-02-01: qty 2

## 2013-02-01 MED ORDER — HYDROCODONE-ACETAMINOPHEN 5-325 MG PO TABS: 1.0000 | ORAL_TABLET | Freq: Four times a day (QID) | ORAL | Status: DC | PRN

## 2013-02-01 NOTE — ED Provider Notes (Signed)
CSN: [id number]     Arrival date & time 02/01/13  1252 History   First MD Initiated Contact with Patient 02/01/13 1308     Chief Complaint  Patient presents with  . Fall  . Knee Pain    Right   (Consider location/radiation/quality/duration/timing/severity/associated sxs/prior Treatment) HPI Comments: Repeat Alexis [name] is a 67 year old female has been to the emergency room apartment for multiple falls.  She presents today after falling off procedures and hitting her right knee she has a small abrasion there.  The patient states that she falls a lot because she has weakness in the legs 7 from previous back surgery.  The patient also has difficulty moving her right ankle and sometimes tripped over that foot.  Patient denies hitting her head or losing consciousness.  She has only mild to moderate pain in the knee however her husband stated he wanted her evaluated.  Patient is a 67 y.o. female presenting with fall and knee pain. The history is provided by the patient and medical records. No language interpreter was used.  Fall This is a new problem. The current episode started today. The problem occurs intermittently. The problem has been unchanged. Associated symptoms include weakness. Pertinent negatives include no abdominal pain, anorexia, arthralgias, change in bowel habit, chest pain, chills, congestion, coughing, diaphoresis, fatigue, fever, headaches, joint swelling, myalgias, nausea, neck pain, numbness, rash, sore throat, swollen glands, urinary symptoms, vertigo, visual change or vomiting. The symptoms are aggravated by bending.  Knee Pain Location:  Knee Injury: yes   Knee location:  R knee Pain details:    Quality:  Aching   Radiates to:  Does not radiate   Severity:  Moderate   Duration:  1 day   Progression:  Improving Associated symptoms: back pain and decreased ROM   Associated symptoms: no fatigue, no fever, no itching, no muscle weakness, no neck pain, no swelling and no tingling      Past Medical History  Diagnosis Date  . Diabetes mellitus   . Hypertension   . Morbid obesity   . Coronary artery disease   . Chest pain   . 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 Hyperlipidemia   . Hx of breast cancer   . CHF (congestive heart failure)   . SOB (shortness of breath)   . Chronic kidney disease   . Cancer 2005     left breast  . Diabetic nephropathy 01-08-13  . Proteinuria 01-08-13  . Dyslipidemia 01-08-13  . Thyroid disease 01-08-13    Hyper-parathyroidism-secondary  . Hx: UTI (urinary tract infection) 01-08-13   Past Surgical History  Procedure Laterality Date  . Back surgery    . Tubal ligation    . Transthoracic echocardiogram  10/01/2010     Left ventricle: The cavity size was mildly dilated. Wall thickness was increased in a pattern of mild LVH. Systolic function was   mildly reduced. The estimated ejection fraction was in the range  of 45% to 50%.   . Coronary angioplasty  12/19/2003    inferior wall hypokinesis. ef 50%  . Arm surgery      Left arm trauma  . Mastectomy      Left  . Spine surgery     Family History  Problem Relation Age of Onset  . Breast cancer Mother     Died age 28  . Cancer Mother 36    Breast   History  Substance Use Topics  . Smoking status: Former Smoker --  1.00 packs/day for 30 years    Types: Cigarettes    Quit date: 05/16/1998  . Smokeless tobacco: Never Used  . Alcohol Use: No   OB History   Grav Para Term Preterm Abortions TAB SAB Ect Mult Living                 Review of Systems  Constitutional: Negative for fever, chills, diaphoresis and fatigue.  HENT: Negative for congestion, sore throat and neck pain.   Respiratory: Negative for cough.   Cardiovascular: Negative for chest pain.  Gastrointestinal: Negative for nausea, vomiting, abdominal pain, anorexia and change in bowel habit.  Musculoskeletal: Positive for back pain. Negative for myalgias, joint swelling and arthralgias.  Skin: Negative for itching and rash.   Neurological: Positive for weakness. Negative for vertigo, numbness and headaches.    Allergies  Review of patient's allergies indicates no known allergies.  Home Medications   Current Outpatient Rx  Name  Route  Sig  Dispense  Refill  . amLODipine (NORVASC) 10 MG tablet   Oral   Take 10 mg by mouth every morning.          . carvedilol (COREG) 12.5 MG tablet   Oral   Take 1 tablet (12.5 mg total) by mouth 2 (two) times daily.   60 tablet   6   . furosemide (LASIX) 40 MG tablet   Oral   Take 40 mg by mouth every morning.         . hydrALAZINE (APRESOLINE) 50 MG tablet      50 mg. Take 1 tablet (50 mg total) by mouth every 8 hours.         . hydrOXYzine (ATARAX/VISTARIL) 25 MG tablet   Oral   Take 1 tablet (25 mg total) by mouth every 6 (six) hours as needed for itching.   30 tablet   0   . insulin aspart protamine-insulin aspart (NOVOLOG 70/30) (70-30) 100 UNIT/ML injection   Subcutaneous   Inject 30-60 Units into the skin 2 (two) times daily with a meal. 60 UNITS IN THE MORNING AND 30 UNITS AT BEDTIME         . isosorbide mononitrate (IMDUR) 30 MG 24 hr tablet      30 mg. Take 1 tablet (30 mg total) by mouth daily.         . metoprolol succinate (TOPROL-XL) 50 MG 24 hr tablet   Oral   Take 50 mg by mouth every morning. Take with or immediately following a meal.         . senna (SENOKOT) 8.6 MG tablet   Oral   Take 1-2 tablets by mouth daily. Take 2 tablets by mouth as needed for Constipation.         . torsemide (DEMADEX) 20 MG tablet      40 mg. Take 40 mg by mouth daily.         Marland Kitchen HYDROcodone-acetaminophen (NORCO/VICODIN) 5-325 MG per tablet   Oral   Take 1-2 tablets by mouth every 6 (six) hours as needed for pain.   10 tablet   0   . nitroGLYCERIN (NITROSTAT) 0.4 MG SL tablet   Sublingual   Place 0.4 mg under the tongue every 5 (five) minutes as needed for chest pain.          BP 128/96  Pulse 86  Temp(Src) 98.6 F (37 C)  (Oral)  Resp 18  Ht 5\' 6"  (1.676 m)  Wt 235 lb (106.595 kg)  BMI 37.95 kg/m2  SpO2 98% Physical Exam  Constitutional: She is oriented to person, place, and time. She appears well-developed and well-nourished. No distress.  HENT:  Head: Normocephalic and atraumatic.  Eyes: Conjunctivae are normal. No scleral icterus.  Neck: Normal range of motion.  Cardiovascular: Normal rate, regular rhythm and normal heart sounds.  Exam reveals no gallop and no friction rub.   No murmur heard. Pulmonary/Chest: Effort normal and breath sounds normal. No respiratory distress.  Abdominal: Soft. Bowel sounds are normal. She exhibits no distension and no mass. There is no tenderness. There is no guarding.  Neurological: She is alert and oriented to person, place, and time.  Very limited range of motion of the right ankle.  Patient has flexion to the 110 and extension to approximately 5 of the right knee  Small abrasion present.  Skin: Skin is warm and dry. She is not diaphoretic.  Changes of skin consistent with chronic venous stasis.    ED Course  Procedures (including critical care time) Labs Review Labs Reviewed - No data to display Imaging Review Dg Ankle Complete Right  02/01/2013   CLINICAL DATA:  Fall. Pain.  EXAM: RIGHT ANKLE - COMPLETE 3+ VIEW  COMPARISON:  None.  FINDINGS: Severe degenerative changes limited evaluation for detection of subtle injury. No obvious fracture or dislocation. Soft tissue swelling.  Vascular calcifications.  Spur at the level of the Achilles tendon insertion site and plantar fascia.  Abnormal angulation of the talus with respect to the midfoot.  IMPRESSION: No obvious fracture. Evaluation limited by the significant degenerative changes as detailed above   Electronically Signed   By: Chauncey Cruel   On: 02/01/2013 15:43   Dg Knee Complete 4 Views Right  02/01/2013   CLINICAL DATA:  Fall. Knee pain.  EXAM: RIGHT KNEE - COMPLETE 4+ VIEW  COMPARISON:  12/05/2003.  FINDINGS:  Degenerative changes without fracture or dislocation noted.  Vascular calcifications.  IMPRESSION: No fracture or dislocation.   Electronically Signed   By: Chauncey Cruel   On: 02/01/2013 15:40    MDM   1. Knee pain, left   2. Fall, initial encounter    Patient Ankle shows degenerative changes.  I was concerned for possible Charcot joint.  The patient's E. x-ray is negative for acute abnormality.  Will discharge patient with a small prescription of Norco.  She will followup with her primary care physician.  I have given her discharge instructions fall prevention. The patient appears reasonably screened and/or stabilized for discharge and I doubt any other medical condition or other [facility] requiring further screening, evaluation, or treatment in the ED at this time prior to discharge.     Margarita Mail, PA-C 02/01/13 1702

## 2013-02-01 NOTE — ED Notes (Addendum)
Pt reports right knee pain do to falling, related to her knee buckling around 19:30 last night. Patient reports that the knee popped when she fell. Pt reports hitting the front of  her head (forehead) on the wooden porch. Patient denies the LOC and anticoagulant therapy. Pt shows a 1.15mm skin tear on the right knee related to the fall. Pt reports history of falling 6 months ago and followed with her primary health provider. Pt has weak pedal pulses and slow capillary refill on the affected leg (right). Pt reports numbness on both legs.

## 2013-02-06 NOTE — ED Provider Notes (Signed)
Medical screening examination/treatment/procedure(s) were performed by non-physician practitioner and as supervising physician I was immediately available for consultation/collaboration.   Blanchie Dessert, MD 02/06/13 1801

## 2013-03-08 ENCOUNTER — Emergency Department (HOSPITAL_COMMUNITY)
Admission: EM | Admit: 2013-03-08 | Discharge: 2013-03-08 | Disposition: A | Discharge status: Home / Self Care | Payer: Medicare Other | Attending: Emergency Medicine | Admitting: Emergency Medicine

## 2013-03-08 ENCOUNTER — Encounter (HOSPITAL_COMMUNITY): Payer: Self-pay | Admitting: Emergency Medicine

## 2013-03-08 ENCOUNTER — Emergency Department (HOSPITAL_COMMUNITY): Payer: Medicare Other

## 2013-03-08 DIAGNOSIS — J069 Acute upper respiratory infection, unspecified: Secondary | ICD-10-CM

## 2013-03-08 DIAGNOSIS — I129 Hypertensive chronic kidney disease with stage 1 through stage 4 chronic kidney disease, or unspecified chronic kidney disease: Secondary | ICD-10-CM | POA: Insufficient documentation

## 2013-03-08 DIAGNOSIS — E119 Type 2 diabetes mellitus without complications: Secondary | ICD-10-CM | POA: Insufficient documentation

## 2013-03-08 DIAGNOSIS — Z79899 Other long term (current) drug therapy: Secondary | ICD-10-CM | POA: Insufficient documentation

## 2013-03-08 DIAGNOSIS — I509 Heart failure, unspecified: Secondary | ICD-10-CM | POA: Insufficient documentation

## 2013-03-08 DIAGNOSIS — N189 Chronic kidney disease, unspecified: Secondary | ICD-10-CM | POA: Insufficient documentation

## 2013-03-08 DIAGNOSIS — Z87891 Personal history of nicotine dependence: Secondary | ICD-10-CM | POA: Insufficient documentation

## 2013-03-08 DIAGNOSIS — Z853 Personal history of malignant neoplasm of breast: Secondary | ICD-10-CM | POA: Insufficient documentation

## 2013-03-08 DIAGNOSIS — I252 Old myocardial infarction: Secondary | ICD-10-CM | POA: Insufficient documentation

## 2013-03-08 DIAGNOSIS — Z794 Long term (current) use of insulin: Secondary | ICD-10-CM | POA: Insufficient documentation

## 2013-03-08 DIAGNOSIS — I2510 Atherosclerotic heart disease of native coronary artery without angina pectoris: Secondary | ICD-10-CM | POA: Insufficient documentation

## 2013-03-08 DIAGNOSIS — J40 Bronchitis, not specified as acute or chronic: Secondary | ICD-10-CM | POA: Insufficient documentation

## 2013-03-08 DIAGNOSIS — E1129 Type 2 diabetes mellitus with other diabetic kidney complication: Secondary | ICD-10-CM | POA: Insufficient documentation

## 2013-03-08 DIAGNOSIS — N058 Unspecified nephritic syndrome with other morphologic changes: Secondary | ICD-10-CM | POA: Insufficient documentation

## 2013-03-08 DIAGNOSIS — Z87440 Personal history of urinary (tract) infections: Secondary | ICD-10-CM | POA: Insufficient documentation

## 2013-03-08 MED ORDER — PSEUDOEPHEDRINE HCL 60 MG PO TABS
60.0000 mg | ORAL_TABLET | Freq: Once | ORAL | Status: AC
  Administered 2013-03-08: 60 mg via ORAL
  Filled 2013-03-08: qty 1

## 2013-03-08 MED ORDER — PSEUDOEPHEDRINE-CODEINE 30-10 MG/5ML PO LIQD: 5.0000 mL | Freq: Four times a day (QID) | ORAL | Status: DC

## 2013-03-08 MED ORDER — ALBUTEROL SULFATE HFA 108 (90 BASE) MCG/ACT IN AERS
2.0000 | INHALATION_SPRAY | Freq: Once | RESPIRATORY_TRACT | Status: AC
  Administered 2013-03-08: 2 via RESPIRATORY_TRACT
  Filled 2013-03-08: qty 6.7

## 2013-03-08 NOTE — ED Notes (Signed)
Patient ambulated around the nurses station back to room tolerated well. Starting O2 stat 99% and after walking 96% room air. PA made aware.

## 2013-03-08 NOTE — ED Provider Notes (Signed)
CSN: [id number]     Arrival date & time 03/08/13  0754 History   First MD Initiated Contact with Patient 03/08/13 [phone]     Chief Complaint  Patient presents with  . Cough  . Nasal Congestion   (Consider location/radiation/quality/duration/timing/severity/associated sxs/prior Treatment) Patient is a 67 y.o. female presenting with cough. The history is provided by the patient.  Cough Cough characteristics:  Non-productive Severity:  Moderate Onset quality:  Gradual Duration:  3 days (Pt not feeling well for 3 days, worse last night.) Timing:  Intermittent Progression:  Worsening Chronicity:  New Smoker: no   Context: sick contacts and weather changes   Relieved by:  Nothing Worsened by:  Activity Associated symptoms: sinus congestion   Associated symptoms: no chest pain, no chills, no eye discharge, no fever, no rash, no shortness of breath and no wheezing   Risk factors: no recent travel     Past Medical History  Diagnosis Date  . Diabetes mellitus   . Hypertension   . Morbid obesity   . Coronary artery disease   . Chest pain   . 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 Hyperlipidemia   . Hx of breast cancer   . CHF (congestive heart failure)   . SOB (shortness of breath)   . Chronic kidney disease   . Cancer 2005     left breast  . Diabetic nephropathy 01-08-13  . Proteinuria 01-08-13  . Dyslipidemia 01-08-13  . Thyroid disease 01-08-13    Hyper-parathyroidism-secondary  . Hx: UTI (urinary tract infection) 01-08-13   Past Surgical History  Procedure Laterality Date  . Back surgery    . Tubal ligation    . Transthoracic echocardiogram  10/01/2010     Left ventricle: The cavity size was mildly dilated. Wall thickness was increased in a pattern of mild LVH. Systolic function was   mildly reduced. The estimated ejection fraction was in the range  of 45% to 50%.   . Coronary angioplasty  12/19/2003    inferior wall hypokinesis. ef 50%  . Arm surgery      Left arm trauma  .  Mastectomy      Left  . Spine surgery     Family History  Problem Relation Age of Onset  . Breast cancer Mother     Died age 60  . Cancer Mother 57    Breast   History  Substance Use Topics  . Smoking status: Former Smoker -- 1.00 packs/day for 30 years    Types: Cigarettes    Quit date: 05/16/1998  . Smokeless tobacco: Never Used  . Alcohol Use: No   OB History   Grav Para Term Preterm Abortions TAB SAB Ect Mult Living                 Review of Systems  Constitutional: Negative for fever, chills and activity change.       All ROS Neg except as noted in HPI  HENT: Negative for nosebleeds.   Eyes: Negative for photophobia and discharge.  Respiratory: Positive for cough. Negative for shortness of breath and wheezing.   Cardiovascular: Negative for chest pain and palpitations.  Gastrointestinal: Negative for abdominal pain and blood in stool.  Genitourinary: Negative for dysuria, frequency and hematuria.  Musculoskeletal: Negative for arthralgias, back pain and neck pain.  Skin: Negative.  Negative for rash.  Neurological: Negative for dizziness, seizures and speech difficulty.  Psychiatric/Behavioral: Negative for hallucinations and confusion.    Allergies  Review of patient's allergies indicates no known allergies.  Home Medications   Current Outpatient Rx  Name  Route  Sig  Dispense  Refill  . amLODipine (NORVASC) 10 MG tablet   Oral   Take 10 mg by mouth every morning.          . carvedilol (COREG) 12.5 MG tablet   Oral   Take 1 tablet (12.5 mg total) by mouth 2 (two) times daily.   60 tablet   6   . furosemide (LASIX) 40 MG tablet   Oral   Take 40 mg by mouth every morning.         . hydrALAZINE (APRESOLINE) 50 MG tablet      50 mg. Take 1 tablet (50 mg total) by mouth every 8 hours.         . insulin aspart protamine-insulin aspart (NOVOLOG 70/30) (70-30) 100 UNIT/ML injection   Subcutaneous   Inject 30-60 Units into the skin 2 (two) times  daily with a meal. 60 UNITS IN THE MORNING AND 30 UNITS AT BEDTIME         . isosorbide mononitrate (IMDUR) 30 MG 24 hr tablet      30 mg. Take 1 tablet (30 mg total) by mouth daily.         . metoprolol succinate (TOPROL-XL) 50 MG 24 hr tablet   Oral   Take 50 mg by mouth every morning. Take with or immediately following a meal.         . senna (SENOKOT) 8.6 MG tablet   Oral   Take 2 tablets by mouth daily.          Marland Kitchen torsemide (DEMADEX) 20 MG tablet   Oral   Take 20 mg by mouth daily.          . nitroGLYCERIN (NITROSTAT) 0.4 MG SL tablet   Sublingual   Place 0.4 mg under the tongue every 5 (five) minutes as needed for chest pain.          BP 156/84  Pulse 94  Temp(Src) 98.3 F (36.8 C) (Oral)  Resp 22  Ht 5\' 6"  (1.676 m)  Wt 243 lb (110.224 kg)  BMI 39.24 kg/m2  SpO2 99% Physical Exam  Nursing note and vitals reviewed. Constitutional: She is oriented to person, place, and time. She appears well-developed and well-nourished.  Non-toxic appearance.  HENT:  Head: Normocephalic.  Right Ear: Tympanic membrane and external ear normal.  Left Ear: Tympanic membrane and external ear normal.  Nasal congestion  Eyes: EOM and lids are normal. Pupils are equal, round, and reactive to light.  Neck: Normal range of motion. Neck supple. Carotid bruit is not present.  Cardiovascular: Normal rate, regular rhythm, normal heart sounds, intact distal pulses and normal pulses.   Pulmonary/Chest: No accessory muscle usage. No respiratory distress. She has no decreased breath sounds. She has rhonchi.  Abdominal: Soft. Bowel sounds are normal. There is no tenderness. There is no guarding.  Musculoskeletal: Normal range of motion.  Lymphadenopathy:       Head (right side): No submandibular adenopathy present.       Head (left side): No submandibular adenopathy present.    She has no cervical adenopathy.  Neurological: She is alert and oriented to person, place, and time. She has  normal strength. No cranial nerve deficit or sensory deficit.  Skin: Skin is warm and dry.  Psychiatric: She has a normal mood and affect. Her speech is normal.  ED Course  Procedures (including critical care time) Labs Review Labs Reviewed - No data to display Imaging Review Dg Chest 2 View  03/08/2013   CLINICAL DATA:  Cough and nasal congestion  EXAM: CHEST  2 VIEW  COMPARISON:  August 19, 2012  FINDINGS: There is no edema or consolidation. Cardiomegaly is stable. Pulmonary vascularity is normal. No adenopathy.  There are surgical clips in the left axillary region. There is arthropathy in both shoulders.  IMPRESSION: Stable cardiac enlargement. No edema or consolidation.   Electronically Signed   By: Lowella Grip M.D.   On: 03/08/2013 08:47    EKG Interpretation   None       MDM  No diagnosis found. **I have reviewed nursing notes, vital signs, and all appropriate lab and imaging results for this patient.*  Pt was treated in ED with albuterol and sudafed. Pt feels much better after treatment. Chest xray reveal cardiac enlargement. No congestion or infiltrate. Pt ambulated in the hall with no deterioration  In exam. Pulse Ox 96 to 98 during ambulation. Pt to be treated with sudafed and  Albuterol.  Lenox Ahr, PA-C 03/08/13 [place], PA-C 03/08/13 1018

## 2013-03-08 NOTE — ED Notes (Signed)
Pt c/o cough, congestion, and wheezing since last night. Pt states cough is dry. Pt denies chest pain.

## 2013-03-08 NOTE — ED Provider Notes (Signed)
  Face-to-face evaluation   History: She has had cough and congestion for several days. She is a nonsmoker.  Physical exam: Alert, cooperative. No respiratory distress.   Medical screening examination/treatment/procedure(s) were conducted as a shared visit with non-physician practitioner(s) and myself.  I personally evaluated the patient during the encounter  [name], MD 03/08/13 [phone]

## 2013-03-10 MED ORDER — HYDROCODONE-ACETAMINOPHEN 7.5-325 MG/15ML PO SOLN: 15.0000 mL | Freq: Three times a day (TID) | ORAL | Status: DC | PRN

## 2013-03-10 MED ORDER — BENZONATATE 100 MG PO CAPS: 100.0000 mg | ORAL_CAPSULE | Freq: Three times a day (TID) | ORAL | Status: DC

## 2013-03-10 NOTE — Progress Notes (Signed)
Received incoming call from [facility]- [phone].Baxter Flattery Pharmacist reports that the Cough medication ordered pseudoephedrine 30-10 MG-5ML no longer exists  In [facility].This Case Manager took the message and will liaise with The PA in fast track.This Case Manager spoke with [name] - [place]-PA  Dr Noemi Chapel. New  Order received for Tessalon Pearls-100 mg Capsules-One Capsule to be taken every 8 hours for cough.Order given over the phone to [facility] - Pharmacist- Per PA- [place]-/ Dr Sabra Heck.Pharmacist verified above dose.No further Case Manager needs.

## 2014-03-16 DIAGNOSIS — N189 Chronic kidney disease, unspecified: Secondary | ICD-10-CM

## 2014-03-16 HISTORY — DX: Chronic kidney disease, unspecified: N18.9

## 2014-04-07 DIAGNOSIS — Z992 Dependence on renal dialysis: Secondary | ICD-10-CM | POA: Insufficient documentation

## 2014-04-08 DIAGNOSIS — Z853 Personal history of malignant neoplasm of breast: Secondary | ICD-10-CM | POA: Insufficient documentation

## 2014-05-17 DIAGNOSIS — Z23 Encounter for immunization: Secondary | ICD-10-CM | POA: Diagnosis not present

## 2014-05-17 DIAGNOSIS — D509 Iron deficiency anemia, unspecified: Secondary | ICD-10-CM | POA: Diagnosis not present

## 2014-05-17 DIAGNOSIS — N186 End stage renal disease: Secondary | ICD-10-CM | POA: Diagnosis not present

## 2014-05-17 DIAGNOSIS — Z992 Dependence on renal dialysis: Secondary | ICD-10-CM | POA: Diagnosis not present

## 2014-05-17 DIAGNOSIS — N2581 Secondary hyperparathyroidism of renal origin: Secondary | ICD-10-CM | POA: Diagnosis not present

## 2014-05-17 DIAGNOSIS — D631 Anemia in chronic kidney disease: Secondary | ICD-10-CM | POA: Diagnosis not present

## 2014-05-20 DIAGNOSIS — N2581 Secondary hyperparathyroidism of renal origin: Secondary | ICD-10-CM | POA: Diagnosis not present

## 2014-05-20 DIAGNOSIS — Z992 Dependence on renal dialysis: Secondary | ICD-10-CM | POA: Diagnosis not present

## 2014-05-20 DIAGNOSIS — D509 Iron deficiency anemia, unspecified: Secondary | ICD-10-CM | POA: Diagnosis not present

## 2014-05-20 DIAGNOSIS — N186 End stage renal disease: Secondary | ICD-10-CM | POA: Diagnosis not present

## 2014-05-20 DIAGNOSIS — D631 Anemia in chronic kidney disease: Secondary | ICD-10-CM | POA: Diagnosis not present

## 2014-05-20 DIAGNOSIS — Z23 Encounter for immunization: Secondary | ICD-10-CM | POA: Diagnosis not present

## 2014-05-21 DIAGNOSIS — E784 Other hyperlipidemia: Secondary | ICD-10-CM | POA: Diagnosis not present

## 2014-05-21 DIAGNOSIS — I10 Essential (primary) hypertension: Secondary | ICD-10-CM | POA: Diagnosis not present

## 2014-05-21 DIAGNOSIS — I509 Heart failure, unspecified: Secondary | ICD-10-CM | POA: Diagnosis not present

## 2014-05-21 DIAGNOSIS — N186 End stage renal disease: Secondary | ICD-10-CM | POA: Diagnosis not present

## 2014-05-21 DIAGNOSIS — E1165 Type 2 diabetes mellitus with hyperglycemia: Secondary | ICD-10-CM | POA: Diagnosis not present

## 2014-05-22 DIAGNOSIS — N2581 Secondary hyperparathyroidism of renal origin: Secondary | ICD-10-CM | POA: Diagnosis not present

## 2014-05-22 DIAGNOSIS — Z992 Dependence on renal dialysis: Secondary | ICD-10-CM | POA: Diagnosis not present

## 2014-05-22 DIAGNOSIS — Z23 Encounter for immunization: Secondary | ICD-10-CM | POA: Diagnosis not present

## 2014-05-22 DIAGNOSIS — D631 Anemia in chronic kidney disease: Secondary | ICD-10-CM | POA: Diagnosis not present

## 2014-05-22 DIAGNOSIS — D509 Iron deficiency anemia, unspecified: Secondary | ICD-10-CM | POA: Diagnosis not present

## 2014-05-22 DIAGNOSIS — N186 End stage renal disease: Secondary | ICD-10-CM | POA: Diagnosis not present

## 2014-05-24 DIAGNOSIS — N2581 Secondary hyperparathyroidism of renal origin: Secondary | ICD-10-CM | POA: Diagnosis not present

## 2014-05-24 DIAGNOSIS — N186 End stage renal disease: Secondary | ICD-10-CM | POA: Diagnosis not present

## 2014-05-24 DIAGNOSIS — D509 Iron deficiency anemia, unspecified: Secondary | ICD-10-CM | POA: Diagnosis not present

## 2014-05-24 DIAGNOSIS — Z992 Dependence on renal dialysis: Secondary | ICD-10-CM | POA: Diagnosis not present

## 2014-05-24 DIAGNOSIS — Z23 Encounter for immunization: Secondary | ICD-10-CM | POA: Diagnosis not present

## 2014-05-24 DIAGNOSIS — D631 Anemia in chronic kidney disease: Secondary | ICD-10-CM | POA: Diagnosis not present

## 2014-05-26 DIAGNOSIS — H25813 Combined forms of age-related cataract, bilateral: Secondary | ICD-10-CM | POA: Diagnosis not present

## 2014-05-26 DIAGNOSIS — E10331 Type 1 diabetes mellitus with moderate nonproliferative diabetic retinopathy with macular edema: Secondary | ICD-10-CM | POA: Diagnosis not present

## 2014-05-27 DIAGNOSIS — N186 End stage renal disease: Secondary | ICD-10-CM | POA: Diagnosis not present

## 2014-05-27 DIAGNOSIS — D509 Iron deficiency anemia, unspecified: Secondary | ICD-10-CM | POA: Diagnosis not present

## 2014-05-27 DIAGNOSIS — N2581 Secondary hyperparathyroidism of renal origin: Secondary | ICD-10-CM | POA: Diagnosis not present

## 2014-05-27 DIAGNOSIS — Z992 Dependence on renal dialysis: Secondary | ICD-10-CM | POA: Diagnosis not present

## 2014-05-27 DIAGNOSIS — D631 Anemia in chronic kidney disease: Secondary | ICD-10-CM | POA: Diagnosis not present

## 2014-05-27 DIAGNOSIS — Z23 Encounter for immunization: Secondary | ICD-10-CM | POA: Diagnosis not present

## 2014-05-28 DIAGNOSIS — E11351 Type 2 diabetes mellitus with proliferative diabetic retinopathy with macular edema: Secondary | ICD-10-CM | POA: Diagnosis not present

## 2014-05-28 DIAGNOSIS — E11359 Type 2 diabetes mellitus with proliferative diabetic retinopathy without macular edema: Secondary | ICD-10-CM | POA: Diagnosis not present

## 2014-05-29 DIAGNOSIS — Z992 Dependence on renal dialysis: Secondary | ICD-10-CM | POA: Diagnosis not present

## 2014-05-29 DIAGNOSIS — N2581 Secondary hyperparathyroidism of renal origin: Secondary | ICD-10-CM | POA: Diagnosis not present

## 2014-05-29 DIAGNOSIS — Z23 Encounter for immunization: Secondary | ICD-10-CM | POA: Diagnosis not present

## 2014-05-29 DIAGNOSIS — D509 Iron deficiency anemia, unspecified: Secondary | ICD-10-CM | POA: Diagnosis not present

## 2014-05-29 DIAGNOSIS — N186 End stage renal disease: Secondary | ICD-10-CM | POA: Diagnosis not present

## 2014-05-29 DIAGNOSIS — D631 Anemia in chronic kidney disease: Secondary | ICD-10-CM | POA: Diagnosis not present

## 2014-05-31 DIAGNOSIS — D631 Anemia in chronic kidney disease: Secondary | ICD-10-CM | POA: Diagnosis not present

## 2014-05-31 DIAGNOSIS — Z23 Encounter for immunization: Secondary | ICD-10-CM | POA: Diagnosis not present

## 2014-05-31 DIAGNOSIS — N2581 Secondary hyperparathyroidism of renal origin: Secondary | ICD-10-CM | POA: Diagnosis not present

## 2014-05-31 DIAGNOSIS — N186 End stage renal disease: Secondary | ICD-10-CM | POA: Diagnosis not present

## 2014-05-31 DIAGNOSIS — Z992 Dependence on renal dialysis: Secondary | ICD-10-CM | POA: Diagnosis not present

## 2014-05-31 DIAGNOSIS — D509 Iron deficiency anemia, unspecified: Secondary | ICD-10-CM | POA: Diagnosis not present

## 2014-06-03 DIAGNOSIS — N2581 Secondary hyperparathyroidism of renal origin: Secondary | ICD-10-CM | POA: Diagnosis not present

## 2014-06-03 DIAGNOSIS — Z23 Encounter for immunization: Secondary | ICD-10-CM | POA: Diagnosis not present

## 2014-06-03 DIAGNOSIS — D631 Anemia in chronic kidney disease: Secondary | ICD-10-CM | POA: Diagnosis not present

## 2014-06-03 DIAGNOSIS — Z992 Dependence on renal dialysis: Secondary | ICD-10-CM | POA: Diagnosis not present

## 2014-06-03 DIAGNOSIS — D509 Iron deficiency anemia, unspecified: Secondary | ICD-10-CM | POA: Diagnosis not present

## 2014-06-03 DIAGNOSIS — N186 End stage renal disease: Secondary | ICD-10-CM | POA: Diagnosis not present

## 2014-06-05 DIAGNOSIS — Z23 Encounter for immunization: Secondary | ICD-10-CM | POA: Diagnosis not present

## 2014-06-05 DIAGNOSIS — D631 Anemia in chronic kidney disease: Secondary | ICD-10-CM | POA: Diagnosis not present

## 2014-06-05 DIAGNOSIS — D509 Iron deficiency anemia, unspecified: Secondary | ICD-10-CM | POA: Diagnosis not present

## 2014-06-05 DIAGNOSIS — N2581 Secondary hyperparathyroidism of renal origin: Secondary | ICD-10-CM | POA: Diagnosis not present

## 2014-06-05 DIAGNOSIS — N186 End stage renal disease: Secondary | ICD-10-CM | POA: Diagnosis not present

## 2014-06-05 DIAGNOSIS — Z992 Dependence on renal dialysis: Secondary | ICD-10-CM | POA: Diagnosis not present

## 2014-06-08 DIAGNOSIS — D631 Anemia in chronic kidney disease: Secondary | ICD-10-CM | POA: Diagnosis not present

## 2014-06-08 DIAGNOSIS — N2581 Secondary hyperparathyroidism of renal origin: Secondary | ICD-10-CM | POA: Diagnosis not present

## 2014-06-08 DIAGNOSIS — D509 Iron deficiency anemia, unspecified: Secondary | ICD-10-CM | POA: Diagnosis not present

## 2014-06-08 DIAGNOSIS — N186 End stage renal disease: Secondary | ICD-10-CM | POA: Diagnosis not present

## 2014-06-08 DIAGNOSIS — Z23 Encounter for immunization: Secondary | ICD-10-CM | POA: Diagnosis not present

## 2014-06-08 DIAGNOSIS — Z992 Dependence on renal dialysis: Secondary | ICD-10-CM | POA: Diagnosis not present

## 2014-06-10 DIAGNOSIS — D631 Anemia in chronic kidney disease: Secondary | ICD-10-CM | POA: Diagnosis not present

## 2014-06-10 DIAGNOSIS — N186 End stage renal disease: Secondary | ICD-10-CM | POA: Diagnosis not present

## 2014-06-10 DIAGNOSIS — Z23 Encounter for immunization: Secondary | ICD-10-CM | POA: Diagnosis not present

## 2014-06-10 DIAGNOSIS — N2581 Secondary hyperparathyroidism of renal origin: Secondary | ICD-10-CM | POA: Diagnosis not present

## 2014-06-10 DIAGNOSIS — Z992 Dependence on renal dialysis: Secondary | ICD-10-CM | POA: Diagnosis not present

## 2014-06-10 DIAGNOSIS — D509 Iron deficiency anemia, unspecified: Secondary | ICD-10-CM | POA: Diagnosis not present

## 2014-06-11 DIAGNOSIS — Z992 Dependence on renal dialysis: Secondary | ICD-10-CM | POA: Diagnosis not present

## 2014-06-11 DIAGNOSIS — D631 Anemia in chronic kidney disease: Secondary | ICD-10-CM | POA: Diagnosis not present

## 2014-06-11 DIAGNOSIS — N2581 Secondary hyperparathyroidism of renal origin: Secondary | ICD-10-CM | POA: Diagnosis not present

## 2014-06-11 DIAGNOSIS — N186 End stage renal disease: Secondary | ICD-10-CM | POA: Diagnosis not present

## 2014-06-11 DIAGNOSIS — Z23 Encounter for immunization: Secondary | ICD-10-CM | POA: Diagnosis not present

## 2014-06-11 DIAGNOSIS — D509 Iron deficiency anemia, unspecified: Secondary | ICD-10-CM | POA: Diagnosis not present

## 2014-06-12 DIAGNOSIS — I120 Hypertensive chronic kidney disease with stage 5 chronic kidney disease or end stage renal disease: Secondary | ICD-10-CM | POA: Diagnosis not present

## 2014-06-12 DIAGNOSIS — I70208 Unspecified atherosclerosis of native arteries of extremities, other extremity: Secondary | ICD-10-CM | POA: Diagnosis not present

## 2014-06-12 DIAGNOSIS — N186 End stage renal disease: Secondary | ICD-10-CM | POA: Diagnosis not present

## 2014-06-12 DIAGNOSIS — R600 Localized edema: Secondary | ICD-10-CM | POA: Diagnosis not present

## 2014-06-12 DIAGNOSIS — Z992 Dependence on renal dialysis: Secondary | ICD-10-CM | POA: Insufficient documentation

## 2014-06-12 DIAGNOSIS — I10 Essential (primary) hypertension: Secondary | ICD-10-CM | POA: Diagnosis not present

## 2014-06-14 DIAGNOSIS — N2581 Secondary hyperparathyroidism of renal origin: Secondary | ICD-10-CM | POA: Diagnosis not present

## 2014-06-14 DIAGNOSIS — D509 Iron deficiency anemia, unspecified: Secondary | ICD-10-CM | POA: Diagnosis not present

## 2014-06-14 DIAGNOSIS — Z992 Dependence on renal dialysis: Secondary | ICD-10-CM | POA: Diagnosis not present

## 2014-06-14 DIAGNOSIS — D631 Anemia in chronic kidney disease: Secondary | ICD-10-CM | POA: Diagnosis not present

## 2014-06-14 DIAGNOSIS — N186 End stage renal disease: Secondary | ICD-10-CM | POA: Diagnosis not present

## 2014-06-14 DIAGNOSIS — Z23 Encounter for immunization: Secondary | ICD-10-CM | POA: Diagnosis not present

## 2014-06-15 DIAGNOSIS — M86171 Other acute osteomyelitis, right ankle and foot: Secondary | ICD-10-CM | POA: Diagnosis not present

## 2014-06-15 DIAGNOSIS — L03115 Cellulitis of right lower limb: Secondary | ICD-10-CM | POA: Diagnosis not present

## 2014-06-15 DIAGNOSIS — Z992 Dependence on renal dialysis: Secondary | ICD-10-CM | POA: Diagnosis not present

## 2014-06-15 DIAGNOSIS — E11621 Type 2 diabetes mellitus with foot ulcer: Secondary | ICD-10-CM | POA: Diagnosis not present

## 2014-06-15 DIAGNOSIS — N186 End stage renal disease: Secondary | ICD-10-CM | POA: Diagnosis not present

## 2014-06-16 DIAGNOSIS — E1140 Type 2 diabetes mellitus with diabetic neuropathy, unspecified: Secondary | ICD-10-CM | POA: Diagnosis not present

## 2014-06-16 DIAGNOSIS — E1152 Type 2 diabetes mellitus with diabetic peripheral angiopathy with gangrene: Secondary | ICD-10-CM | POA: Diagnosis not present

## 2014-06-16 DIAGNOSIS — L97519 Non-pressure chronic ulcer of other part of right foot with unspecified severity: Secondary | ICD-10-CM | POA: Diagnosis not present

## 2014-06-16 DIAGNOSIS — E1165 Type 2 diabetes mellitus with hyperglycemia: Secondary | ICD-10-CM | POA: Diagnosis not present

## 2014-06-16 DIAGNOSIS — Z87891 Personal history of nicotine dependence: Secondary | ICD-10-CM | POA: Diagnosis not present

## 2014-06-16 DIAGNOSIS — I10 Essential (primary) hypertension: Secondary | ICD-10-CM | POA: Diagnosis not present

## 2014-06-16 DIAGNOSIS — Z79899 Other long term (current) drug therapy: Secondary | ICD-10-CM | POA: Diagnosis not present

## 2014-06-16 DIAGNOSIS — D649 Anemia, unspecified: Secondary | ICD-10-CM | POA: Diagnosis not present

## 2014-06-16 DIAGNOSIS — Z6841 Body Mass Index (BMI) 40.0 and over, adult: Secondary | ICD-10-CM | POA: Diagnosis not present

## 2014-06-16 DIAGNOSIS — I739 Peripheral vascular disease, unspecified: Secondary | ICD-10-CM | POA: Diagnosis not present

## 2014-06-16 DIAGNOSIS — E11621 Type 2 diabetes mellitus with foot ulcer: Secondary | ICD-10-CM | POA: Diagnosis not present

## 2014-06-16 DIAGNOSIS — E1129 Type 2 diabetes mellitus with other diabetic kidney complication: Secondary | ICD-10-CM | POA: Diagnosis not present

## 2014-06-16 DIAGNOSIS — L03032 Cellulitis of left toe: Secondary | ICD-10-CM | POA: Diagnosis not present

## 2014-06-16 DIAGNOSIS — M869 Osteomyelitis, unspecified: Secondary | ICD-10-CM | POA: Diagnosis not present

## 2014-06-16 DIAGNOSIS — E1169 Type 2 diabetes mellitus with other specified complication: Secondary | ICD-10-CM | POA: Diagnosis not present

## 2014-06-16 DIAGNOSIS — N186 End stage renal disease: Secondary | ICD-10-CM | POA: Diagnosis not present

## 2014-06-16 DIAGNOSIS — E13621 Other specified diabetes mellitus with foot ulcer: Secondary | ICD-10-CM | POA: Diagnosis not present

## 2014-06-16 DIAGNOSIS — M19071 Primary osteoarthritis, right ankle and foot: Secondary | ICD-10-CM | POA: Diagnosis not present

## 2014-06-16 DIAGNOSIS — Z794 Long term (current) use of insulin: Secondary | ICD-10-CM | POA: Diagnosis not present

## 2014-06-16 DIAGNOSIS — I509 Heart failure, unspecified: Secondary | ICD-10-CM | POA: Diagnosis not present

## 2014-06-16 DIAGNOSIS — Z992 Dependence on renal dialysis: Secondary | ICD-10-CM | POA: Diagnosis not present

## 2014-06-16 DIAGNOSIS — L03031 Cellulitis of right toe: Secondary | ICD-10-CM | POA: Diagnosis not present

## 2014-06-16 DIAGNOSIS — L03115 Cellulitis of right lower limb: Secondary | ICD-10-CM | POA: Diagnosis not present

## 2014-06-16 DIAGNOSIS — Z809 Family history of malignant neoplasm, unspecified: Secondary | ICD-10-CM | POA: Diagnosis not present

## 2014-06-16 DIAGNOSIS — I120 Hypertensive chronic kidney disease with stage 5 chronic kidney disease or end stage renal disease: Secondary | ICD-10-CM | POA: Diagnosis not present

## 2014-06-16 DIAGNOSIS — E669 Obesity, unspecified: Secondary | ICD-10-CM | POA: Diagnosis not present

## 2014-06-16 DIAGNOSIS — M86171 Other acute osteomyelitis, right ankle and foot: Secondary | ICD-10-CM | POA: Diagnosis not present

## 2014-06-16 DIAGNOSIS — I96 Gangrene, not elsewhere classified: Secondary | ICD-10-CM | POA: Diagnosis not present

## 2014-06-16 DIAGNOSIS — M868X7 Other osteomyelitis, ankle and foot: Secondary | ICD-10-CM | POA: Diagnosis not present

## 2014-06-16 DIAGNOSIS — L0390 Cellulitis, unspecified: Secondary | ICD-10-CM | POA: Diagnosis not present

## 2014-06-16 DIAGNOSIS — M898X9 Other specified disorders of bone, unspecified site: Secondary | ICD-10-CM | POA: Diagnosis not present

## 2014-06-20 DIAGNOSIS — Z992 Dependence on renal dialysis: Secondary | ICD-10-CM | POA: Diagnosis not present

## 2014-06-20 DIAGNOSIS — N186 End stage renal disease: Secondary | ICD-10-CM | POA: Diagnosis not present

## 2014-06-20 DIAGNOSIS — Z4781 Encounter for orthopedic aftercare following surgical amputation: Secondary | ICD-10-CM | POA: Diagnosis not present

## 2014-06-20 DIAGNOSIS — Z89421 Acquired absence of other right toe(s): Secondary | ICD-10-CM | POA: Diagnosis not present

## 2014-06-20 DIAGNOSIS — I120 Hypertensive chronic kidney disease with stage 5 chronic kidney disease or end stage renal disease: Secondary | ICD-10-CM | POA: Diagnosis not present

## 2014-06-20 DIAGNOSIS — I509 Heart failure, unspecified: Secondary | ICD-10-CM | POA: Diagnosis not present

## 2014-06-20 DIAGNOSIS — E119 Type 2 diabetes mellitus without complications: Secondary | ICD-10-CM | POA: Diagnosis not present

## 2014-06-20 DIAGNOSIS — Z4801 Encounter for change or removal of surgical wound dressing: Secondary | ICD-10-CM | POA: Diagnosis not present

## 2014-06-21 DIAGNOSIS — E119 Type 2 diabetes mellitus without complications: Secondary | ICD-10-CM | POA: Diagnosis not present

## 2014-06-21 DIAGNOSIS — N186 End stage renal disease: Secondary | ICD-10-CM | POA: Diagnosis not present

## 2014-06-21 DIAGNOSIS — Z23 Encounter for immunization: Secondary | ICD-10-CM | POA: Diagnosis not present

## 2014-06-21 DIAGNOSIS — I509 Heart failure, unspecified: Secondary | ICD-10-CM | POA: Diagnosis not present

## 2014-06-21 DIAGNOSIS — D509 Iron deficiency anemia, unspecified: Secondary | ICD-10-CM | POA: Diagnosis not present

## 2014-06-21 DIAGNOSIS — Z992 Dependence on renal dialysis: Secondary | ICD-10-CM | POA: Diagnosis not present

## 2014-06-21 DIAGNOSIS — N2581 Secondary hyperparathyroidism of renal origin: Secondary | ICD-10-CM | POA: Diagnosis not present

## 2014-06-21 DIAGNOSIS — I120 Hypertensive chronic kidney disease with stage 5 chronic kidney disease or end stage renal disease: Secondary | ICD-10-CM | POA: Diagnosis not present

## 2014-06-21 DIAGNOSIS — Z4801 Encounter for change or removal of surgical wound dressing: Secondary | ICD-10-CM | POA: Diagnosis not present

## 2014-06-21 DIAGNOSIS — Z89421 Acquired absence of other right toe(s): Secondary | ICD-10-CM | POA: Diagnosis not present

## 2014-06-21 DIAGNOSIS — D631 Anemia in chronic kidney disease: Secondary | ICD-10-CM | POA: Diagnosis not present

## 2014-06-21 DIAGNOSIS — Z4781 Encounter for orthopedic aftercare following surgical amputation: Secondary | ICD-10-CM | POA: Diagnosis not present

## 2014-06-22 DIAGNOSIS — I509 Heart failure, unspecified: Secondary | ICD-10-CM | POA: Diagnosis not present

## 2014-06-22 DIAGNOSIS — Z89421 Acquired absence of other right toe(s): Secondary | ICD-10-CM | POA: Diagnosis not present

## 2014-06-22 DIAGNOSIS — Z4801 Encounter for change or removal of surgical wound dressing: Secondary | ICD-10-CM | POA: Diagnosis not present

## 2014-06-22 DIAGNOSIS — I120 Hypertensive chronic kidney disease with stage 5 chronic kidney disease or end stage renal disease: Secondary | ICD-10-CM | POA: Diagnosis not present

## 2014-06-22 DIAGNOSIS — Z992 Dependence on renal dialysis: Secondary | ICD-10-CM | POA: Diagnosis not present

## 2014-06-22 DIAGNOSIS — N186 End stage renal disease: Secondary | ICD-10-CM | POA: Diagnosis not present

## 2014-06-22 DIAGNOSIS — Z4781 Encounter for orthopedic aftercare following surgical amputation: Secondary | ICD-10-CM | POA: Diagnosis not present

## 2014-06-22 DIAGNOSIS — E119 Type 2 diabetes mellitus without complications: Secondary | ICD-10-CM | POA: Diagnosis not present

## 2014-06-23 DIAGNOSIS — Z4781 Encounter for orthopedic aftercare following surgical amputation: Secondary | ICD-10-CM | POA: Diagnosis not present

## 2014-06-23 DIAGNOSIS — M869 Osteomyelitis, unspecified: Secondary | ICD-10-CM | POA: Diagnosis not present

## 2014-06-24 DIAGNOSIS — D631 Anemia in chronic kidney disease: Secondary | ICD-10-CM | POA: Diagnosis not present

## 2014-06-24 DIAGNOSIS — Z23 Encounter for immunization: Secondary | ICD-10-CM | POA: Diagnosis not present

## 2014-06-24 DIAGNOSIS — N2581 Secondary hyperparathyroidism of renal origin: Secondary | ICD-10-CM | POA: Diagnosis not present

## 2014-06-24 DIAGNOSIS — N186 End stage renal disease: Secondary | ICD-10-CM | POA: Diagnosis not present

## 2014-06-24 DIAGNOSIS — Z992 Dependence on renal dialysis: Secondary | ICD-10-CM | POA: Diagnosis not present

## 2014-06-24 DIAGNOSIS — D509 Iron deficiency anemia, unspecified: Secondary | ICD-10-CM | POA: Diagnosis not present

## 2014-06-26 DIAGNOSIS — Z992 Dependence on renal dialysis: Secondary | ICD-10-CM | POA: Diagnosis not present

## 2014-06-26 DIAGNOSIS — N186 End stage renal disease: Secondary | ICD-10-CM | POA: Diagnosis not present

## 2014-06-26 DIAGNOSIS — D509 Iron deficiency anemia, unspecified: Secondary | ICD-10-CM | POA: Diagnosis not present

## 2014-06-26 DIAGNOSIS — D631 Anemia in chronic kidney disease: Secondary | ICD-10-CM | POA: Diagnosis not present

## 2014-06-26 DIAGNOSIS — Z23 Encounter for immunization: Secondary | ICD-10-CM | POA: Diagnosis not present

## 2014-06-26 DIAGNOSIS — N2581 Secondary hyperparathyroidism of renal origin: Secondary | ICD-10-CM | POA: Diagnosis not present

## 2014-06-27 DIAGNOSIS — E119 Type 2 diabetes mellitus without complications: Secondary | ICD-10-CM | POA: Diagnosis not present

## 2014-06-27 DIAGNOSIS — Z992 Dependence on renal dialysis: Secondary | ICD-10-CM | POA: Diagnosis not present

## 2014-06-27 DIAGNOSIS — Z89421 Acquired absence of other right toe(s): Secondary | ICD-10-CM | POA: Diagnosis not present

## 2014-06-27 DIAGNOSIS — I509 Heart failure, unspecified: Secondary | ICD-10-CM | POA: Diagnosis not present

## 2014-06-27 DIAGNOSIS — Z4781 Encounter for orthopedic aftercare following surgical amputation: Secondary | ICD-10-CM | POA: Diagnosis not present

## 2014-06-27 DIAGNOSIS — N186 End stage renal disease: Secondary | ICD-10-CM | POA: Diagnosis not present

## 2014-06-27 DIAGNOSIS — Z4801 Encounter for change or removal of surgical wound dressing: Secondary | ICD-10-CM | POA: Diagnosis not present

## 2014-06-27 DIAGNOSIS — I120 Hypertensive chronic kidney disease with stage 5 chronic kidney disease or end stage renal disease: Secondary | ICD-10-CM | POA: Diagnosis not present

## 2014-06-28 DIAGNOSIS — D631 Anemia in chronic kidney disease: Secondary | ICD-10-CM | POA: Diagnosis not present

## 2014-06-28 DIAGNOSIS — N186 End stage renal disease: Secondary | ICD-10-CM | POA: Diagnosis not present

## 2014-06-28 DIAGNOSIS — Z23 Encounter for immunization: Secondary | ICD-10-CM | POA: Diagnosis not present

## 2014-06-28 DIAGNOSIS — Z992 Dependence on renal dialysis: Secondary | ICD-10-CM | POA: Diagnosis not present

## 2014-06-28 DIAGNOSIS — D509 Iron deficiency anemia, unspecified: Secondary | ICD-10-CM | POA: Diagnosis not present

## 2014-06-28 DIAGNOSIS — N2581 Secondary hyperparathyroidism of renal origin: Secondary | ICD-10-CM | POA: Diagnosis not present

## 2014-06-29 DIAGNOSIS — I120 Hypertensive chronic kidney disease with stage 5 chronic kidney disease or end stage renal disease: Secondary | ICD-10-CM | POA: Diagnosis not present

## 2014-06-29 DIAGNOSIS — Z4801 Encounter for change or removal of surgical wound dressing: Secondary | ICD-10-CM | POA: Diagnosis not present

## 2014-06-29 DIAGNOSIS — Z89421 Acquired absence of other right toe(s): Secondary | ICD-10-CM | POA: Diagnosis not present

## 2014-06-29 DIAGNOSIS — N186 End stage renal disease: Secondary | ICD-10-CM | POA: Diagnosis not present

## 2014-06-29 DIAGNOSIS — E119 Type 2 diabetes mellitus without complications: Secondary | ICD-10-CM | POA: Diagnosis not present

## 2014-06-29 DIAGNOSIS — I509 Heart failure, unspecified: Secondary | ICD-10-CM | POA: Diagnosis not present

## 2014-06-29 DIAGNOSIS — Z4781 Encounter for orthopedic aftercare following surgical amputation: Secondary | ICD-10-CM | POA: Diagnosis not present

## 2014-06-29 DIAGNOSIS — Z992 Dependence on renal dialysis: Secondary | ICD-10-CM | POA: Diagnosis not present

## 2014-06-30 DIAGNOSIS — Z87891 Personal history of nicotine dependence: Secondary | ICD-10-CM | POA: Diagnosis not present

## 2014-06-30 DIAGNOSIS — I238 Other current complications following acute myocardial infarction: Secondary | ICD-10-CM | POA: Diagnosis not present

## 2014-06-30 DIAGNOSIS — E119 Type 2 diabetes mellitus without complications: Secondary | ICD-10-CM | POA: Diagnosis not present

## 2014-06-30 DIAGNOSIS — Z794 Long term (current) use of insulin: Secondary | ICD-10-CM | POA: Diagnosis not present

## 2014-06-30 DIAGNOSIS — M7021 Olecranon bursitis, right elbow: Secondary | ICD-10-CM | POA: Diagnosis not present

## 2014-06-30 DIAGNOSIS — M19021 Primary osteoarthritis, right elbow: Secondary | ICD-10-CM | POA: Diagnosis not present

## 2014-06-30 DIAGNOSIS — Z992 Dependence on renal dialysis: Secondary | ICD-10-CM | POA: Diagnosis not present

## 2014-06-30 DIAGNOSIS — M7989 Other specified soft tissue disorders: Secondary | ICD-10-CM | POA: Diagnosis not present

## 2014-06-30 DIAGNOSIS — I120 Hypertensive chronic kidney disease with stage 5 chronic kidney disease or end stage renal disease: Secondary | ICD-10-CM | POA: Diagnosis not present

## 2014-06-30 DIAGNOSIS — N186 End stage renal disease: Secondary | ICD-10-CM | POA: Diagnosis not present

## 2014-06-30 DIAGNOSIS — Z4781 Encounter for orthopedic aftercare following surgical amputation: Secondary | ICD-10-CM | POA: Diagnosis not present

## 2014-07-01 DIAGNOSIS — D509 Iron deficiency anemia, unspecified: Secondary | ICD-10-CM | POA: Diagnosis not present

## 2014-07-01 DIAGNOSIS — N2581 Secondary hyperparathyroidism of renal origin: Secondary | ICD-10-CM | POA: Diagnosis not present

## 2014-07-01 DIAGNOSIS — D631 Anemia in chronic kidney disease: Secondary | ICD-10-CM | POA: Diagnosis not present

## 2014-07-01 DIAGNOSIS — Z23 Encounter for immunization: Secondary | ICD-10-CM | POA: Diagnosis not present

## 2014-07-01 DIAGNOSIS — Z992 Dependence on renal dialysis: Secondary | ICD-10-CM | POA: Diagnosis not present

## 2014-07-01 DIAGNOSIS — N186 End stage renal disease: Secondary | ICD-10-CM | POA: Diagnosis not present

## 2014-07-01 DIAGNOSIS — I238 Other current complications following acute myocardial infarction: Secondary | ICD-10-CM | POA: Diagnosis not present

## 2014-07-02 ENCOUNTER — Encounter: Payer: Medicare Other | Admitting: Vascular Surgery

## 2014-07-02 DIAGNOSIS — Z01818 Encounter for other preprocedural examination: Secondary | ICD-10-CM | POA: Diagnosis not present

## 2014-07-02 DIAGNOSIS — I238 Other current complications following acute myocardial infarction: Secondary | ICD-10-CM | POA: Diagnosis not present

## 2014-07-03 DIAGNOSIS — Z23 Encounter for immunization: Secondary | ICD-10-CM | POA: Diagnosis not present

## 2014-07-03 DIAGNOSIS — N2581 Secondary hyperparathyroidism of renal origin: Secondary | ICD-10-CM | POA: Diagnosis not present

## 2014-07-03 DIAGNOSIS — N186 End stage renal disease: Secondary | ICD-10-CM | POA: Diagnosis not present

## 2014-07-03 DIAGNOSIS — D509 Iron deficiency anemia, unspecified: Secondary | ICD-10-CM | POA: Diagnosis not present

## 2014-07-03 DIAGNOSIS — D631 Anemia in chronic kidney disease: Secondary | ICD-10-CM | POA: Diagnosis not present

## 2014-07-03 DIAGNOSIS — Z992 Dependence on renal dialysis: Secondary | ICD-10-CM | POA: Diagnosis not present

## 2014-07-03 DIAGNOSIS — I238 Other current complications following acute myocardial infarction: Secondary | ICD-10-CM | POA: Diagnosis not present

## 2014-07-04 DIAGNOSIS — I238 Other current complications following acute myocardial infarction: Secondary | ICD-10-CM | POA: Diagnosis not present

## 2014-07-05 DIAGNOSIS — N2581 Secondary hyperparathyroidism of renal origin: Secondary | ICD-10-CM | POA: Diagnosis not present

## 2014-07-05 DIAGNOSIS — Z23 Encounter for immunization: Secondary | ICD-10-CM | POA: Diagnosis not present

## 2014-07-05 DIAGNOSIS — I238 Other current complications following acute myocardial infarction: Secondary | ICD-10-CM | POA: Diagnosis not present

## 2014-07-05 DIAGNOSIS — N186 End stage renal disease: Secondary | ICD-10-CM | POA: Diagnosis not present

## 2014-07-05 DIAGNOSIS — Z89421 Acquired absence of other right toe(s): Secondary | ICD-10-CM | POA: Diagnosis not present

## 2014-07-05 DIAGNOSIS — Z992 Dependence on renal dialysis: Secondary | ICD-10-CM | POA: Diagnosis not present

## 2014-07-05 DIAGNOSIS — D509 Iron deficiency anemia, unspecified: Secondary | ICD-10-CM | POA: Diagnosis not present

## 2014-07-05 DIAGNOSIS — I509 Heart failure, unspecified: Secondary | ICD-10-CM | POA: Diagnosis not present

## 2014-07-05 DIAGNOSIS — I120 Hypertensive chronic kidney disease with stage 5 chronic kidney disease or end stage renal disease: Secondary | ICD-10-CM | POA: Diagnosis not present

## 2014-07-05 DIAGNOSIS — E119 Type 2 diabetes mellitus without complications: Secondary | ICD-10-CM | POA: Diagnosis not present

## 2014-07-05 DIAGNOSIS — D631 Anemia in chronic kidney disease: Secondary | ICD-10-CM | POA: Diagnosis not present

## 2014-07-05 DIAGNOSIS — Z4781 Encounter for orthopedic aftercare following surgical amputation: Secondary | ICD-10-CM | POA: Diagnosis not present

## 2014-07-05 DIAGNOSIS — Z4801 Encounter for change or removal of surgical wound dressing: Secondary | ICD-10-CM | POA: Diagnosis not present

## 2014-07-06 DIAGNOSIS — I238 Other current complications following acute myocardial infarction: Secondary | ICD-10-CM | POA: Diagnosis not present

## 2014-07-07 DIAGNOSIS — Z89421 Acquired absence of other right toe(s): Secondary | ICD-10-CM | POA: Diagnosis not present

## 2014-07-07 DIAGNOSIS — I120 Hypertensive chronic kidney disease with stage 5 chronic kidney disease or end stage renal disease: Secondary | ICD-10-CM | POA: Diagnosis not present

## 2014-07-07 DIAGNOSIS — Z992 Dependence on renal dialysis: Secondary | ICD-10-CM | POA: Diagnosis not present

## 2014-07-07 DIAGNOSIS — E119 Type 2 diabetes mellitus without complications: Secondary | ICD-10-CM | POA: Diagnosis not present

## 2014-07-07 DIAGNOSIS — I509 Heart failure, unspecified: Secondary | ICD-10-CM | POA: Diagnosis not present

## 2014-07-07 DIAGNOSIS — Z4801 Encounter for change or removal of surgical wound dressing: Secondary | ICD-10-CM | POA: Diagnosis not present

## 2014-07-07 DIAGNOSIS — I238 Other current complications following acute myocardial infarction: Secondary | ICD-10-CM | POA: Diagnosis not present

## 2014-07-07 DIAGNOSIS — Z4781 Encounter for orthopedic aftercare following surgical amputation: Secondary | ICD-10-CM | POA: Diagnosis not present

## 2014-07-07 DIAGNOSIS — N186 End stage renal disease: Secondary | ICD-10-CM | POA: Diagnosis not present

## 2014-07-08 DIAGNOSIS — Z23 Encounter for immunization: Secondary | ICD-10-CM | POA: Diagnosis not present

## 2014-07-08 DIAGNOSIS — I238 Other current complications following acute myocardial infarction: Secondary | ICD-10-CM | POA: Diagnosis not present

## 2014-07-08 DIAGNOSIS — Z992 Dependence on renal dialysis: Secondary | ICD-10-CM | POA: Diagnosis not present

## 2014-07-08 DIAGNOSIS — D509 Iron deficiency anemia, unspecified: Secondary | ICD-10-CM | POA: Diagnosis not present

## 2014-07-08 DIAGNOSIS — N186 End stage renal disease: Secondary | ICD-10-CM | POA: Diagnosis not present

## 2014-07-08 DIAGNOSIS — N2581 Secondary hyperparathyroidism of renal origin: Secondary | ICD-10-CM | POA: Diagnosis not present

## 2014-07-08 DIAGNOSIS — D631 Anemia in chronic kidney disease: Secondary | ICD-10-CM | POA: Diagnosis not present

## 2014-07-09 DIAGNOSIS — L97519 Non-pressure chronic ulcer of other part of right foot with unspecified severity: Secondary | ICD-10-CM | POA: Diagnosis not present

## 2014-07-09 DIAGNOSIS — Z833 Family history of diabetes mellitus: Secondary | ICD-10-CM | POA: Diagnosis not present

## 2014-07-09 DIAGNOSIS — M7021 Olecranon bursitis, right elbow: Secondary | ICD-10-CM | POA: Diagnosis not present

## 2014-07-09 DIAGNOSIS — Z8249 Family history of ischemic heart disease and other diseases of the circulatory system: Secondary | ICD-10-CM | POA: Diagnosis not present

## 2014-07-09 DIAGNOSIS — L97514 Non-pressure chronic ulcer of other part of right foot with necrosis of bone: Secondary | ICD-10-CM | POA: Diagnosis not present

## 2014-07-09 DIAGNOSIS — Z809 Family history of malignant neoplasm, unspecified: Secondary | ICD-10-CM | POA: Diagnosis not present

## 2014-07-09 DIAGNOSIS — I238 Other current complications following acute myocardial infarction: Secondary | ICD-10-CM | POA: Diagnosis not present

## 2014-07-09 DIAGNOSIS — E11621 Type 2 diabetes mellitus with foot ulcer: Secondary | ICD-10-CM | POA: Diagnosis not present

## 2014-07-09 DIAGNOSIS — Z794 Long term (current) use of insulin: Secondary | ICD-10-CM | POA: Diagnosis not present

## 2014-07-09 DIAGNOSIS — Z853 Personal history of malignant neoplasm of breast: Secondary | ICD-10-CM | POA: Diagnosis not present

## 2014-07-09 DIAGNOSIS — I779 Disorder of arteries and arterioles, unspecified: Secondary | ICD-10-CM | POA: Diagnosis not present

## 2014-07-09 DIAGNOSIS — Z89421 Acquired absence of other right toe(s): Secondary | ICD-10-CM | POA: Diagnosis not present

## 2014-07-09 DIAGNOSIS — N186 End stage renal disease: Secondary | ICD-10-CM | POA: Diagnosis not present

## 2014-07-09 DIAGNOSIS — M7022 Olecranon bursitis, left elbow: Secondary | ICD-10-CM | POA: Diagnosis not present

## 2014-07-09 DIAGNOSIS — Z79899 Other long term (current) drug therapy: Secondary | ICD-10-CM | POA: Diagnosis not present

## 2014-07-09 DIAGNOSIS — Z823 Family history of stroke: Secondary | ICD-10-CM | POA: Diagnosis not present

## 2014-07-09 DIAGNOSIS — I120 Hypertensive chronic kidney disease with stage 5 chronic kidney disease or end stage renal disease: Secondary | ICD-10-CM | POA: Diagnosis not present

## 2014-07-09 DIAGNOSIS — Z992 Dependence on renal dialysis: Secondary | ICD-10-CM | POA: Diagnosis not present

## 2014-07-10 DIAGNOSIS — D631 Anemia in chronic kidney disease: Secondary | ICD-10-CM | POA: Diagnosis not present

## 2014-07-10 DIAGNOSIS — N186 End stage renal disease: Secondary | ICD-10-CM | POA: Diagnosis not present

## 2014-07-10 DIAGNOSIS — N2581 Secondary hyperparathyroidism of renal origin: Secondary | ICD-10-CM | POA: Diagnosis not present

## 2014-07-10 DIAGNOSIS — Z23 Encounter for immunization: Secondary | ICD-10-CM | POA: Diagnosis not present

## 2014-07-10 DIAGNOSIS — I238 Other current complications following acute myocardial infarction: Secondary | ICD-10-CM | POA: Diagnosis not present

## 2014-07-10 DIAGNOSIS — D509 Iron deficiency anemia, unspecified: Secondary | ICD-10-CM | POA: Diagnosis not present

## 2014-07-10 DIAGNOSIS — Z992 Dependence on renal dialysis: Secondary | ICD-10-CM | POA: Diagnosis not present

## 2014-07-11 DIAGNOSIS — I238 Other current complications following acute myocardial infarction: Secondary | ICD-10-CM | POA: Diagnosis not present

## 2014-07-12 DIAGNOSIS — Z4781 Encounter for orthopedic aftercare following surgical amputation: Secondary | ICD-10-CM | POA: Diagnosis not present

## 2014-07-12 DIAGNOSIS — N186 End stage renal disease: Secondary | ICD-10-CM | POA: Diagnosis not present

## 2014-07-12 DIAGNOSIS — Z23 Encounter for immunization: Secondary | ICD-10-CM | POA: Diagnosis not present

## 2014-07-12 DIAGNOSIS — N2581 Secondary hyperparathyroidism of renal origin: Secondary | ICD-10-CM | POA: Diagnosis not present

## 2014-07-12 DIAGNOSIS — Z992 Dependence on renal dialysis: Secondary | ICD-10-CM | POA: Diagnosis not present

## 2014-07-12 DIAGNOSIS — E119 Type 2 diabetes mellitus without complications: Secondary | ICD-10-CM | POA: Diagnosis not present

## 2014-07-12 DIAGNOSIS — Z4801 Encounter for change or removal of surgical wound dressing: Secondary | ICD-10-CM | POA: Diagnosis not present

## 2014-07-12 DIAGNOSIS — D509 Iron deficiency anemia, unspecified: Secondary | ICD-10-CM | POA: Diagnosis not present

## 2014-07-12 DIAGNOSIS — I238 Other current complications following acute myocardial infarction: Secondary | ICD-10-CM | POA: Diagnosis not present

## 2014-07-12 DIAGNOSIS — I509 Heart failure, unspecified: Secondary | ICD-10-CM | POA: Diagnosis not present

## 2014-07-12 DIAGNOSIS — I120 Hypertensive chronic kidney disease with stage 5 chronic kidney disease or end stage renal disease: Secondary | ICD-10-CM | POA: Diagnosis not present

## 2014-07-12 DIAGNOSIS — D631 Anemia in chronic kidney disease: Secondary | ICD-10-CM | POA: Diagnosis not present

## 2014-07-12 DIAGNOSIS — Z89421 Acquired absence of other right toe(s): Secondary | ICD-10-CM | POA: Diagnosis not present

## 2014-07-13 DIAGNOSIS — I238 Other current complications following acute myocardial infarction: Secondary | ICD-10-CM | POA: Diagnosis not present

## 2014-07-14 DIAGNOSIS — I238 Other current complications following acute myocardial infarction: Secondary | ICD-10-CM | POA: Diagnosis not present

## 2014-07-14 DIAGNOSIS — N186 End stage renal disease: Secondary | ICD-10-CM | POA: Diagnosis not present

## 2014-07-14 DIAGNOSIS — Z992 Dependence on renal dialysis: Secondary | ICD-10-CM | POA: Diagnosis not present

## 2014-07-15 DIAGNOSIS — D631 Anemia in chronic kidney disease: Secondary | ICD-10-CM | POA: Diagnosis not present

## 2014-07-15 DIAGNOSIS — I238 Other current complications following acute myocardial infarction: Secondary | ICD-10-CM | POA: Diagnosis not present

## 2014-07-15 DIAGNOSIS — N2581 Secondary hyperparathyroidism of renal origin: Secondary | ICD-10-CM | POA: Diagnosis not present

## 2014-07-15 DIAGNOSIS — D509 Iron deficiency anemia, unspecified: Secondary | ICD-10-CM | POA: Diagnosis not present

## 2014-07-15 DIAGNOSIS — Z992 Dependence on renal dialysis: Secondary | ICD-10-CM | POA: Diagnosis not present

## 2014-07-15 DIAGNOSIS — N186 End stage renal disease: Secondary | ICD-10-CM | POA: Diagnosis not present

## 2014-07-16 DIAGNOSIS — N186 End stage renal disease: Secondary | ICD-10-CM | POA: Diagnosis not present

## 2014-07-16 DIAGNOSIS — Z992 Dependence on renal dialysis: Secondary | ICD-10-CM | POA: Diagnosis not present

## 2014-07-16 DIAGNOSIS — Z4781 Encounter for orthopedic aftercare following surgical amputation: Secondary | ICD-10-CM | POA: Diagnosis not present

## 2014-07-16 DIAGNOSIS — Z4801 Encounter for change or removal of surgical wound dressing: Secondary | ICD-10-CM | POA: Diagnosis not present

## 2014-07-16 DIAGNOSIS — Z89421 Acquired absence of other right toe(s): Secondary | ICD-10-CM | POA: Diagnosis not present

## 2014-07-16 DIAGNOSIS — E119 Type 2 diabetes mellitus without complications: Secondary | ICD-10-CM | POA: Diagnosis not present

## 2014-07-16 DIAGNOSIS — I509 Heart failure, unspecified: Secondary | ICD-10-CM | POA: Diagnosis not present

## 2014-07-16 DIAGNOSIS — I238 Other current complications following acute myocardial infarction: Secondary | ICD-10-CM | POA: Diagnosis not present

## 2014-07-16 DIAGNOSIS — I120 Hypertensive chronic kidney disease with stage 5 chronic kidney disease or end stage renal disease: Secondary | ICD-10-CM | POA: Diagnosis not present

## 2014-07-17 DIAGNOSIS — D509 Iron deficiency anemia, unspecified: Secondary | ICD-10-CM | POA: Diagnosis not present

## 2014-07-17 DIAGNOSIS — N2581 Secondary hyperparathyroidism of renal origin: Secondary | ICD-10-CM | POA: Diagnosis not present

## 2014-07-17 DIAGNOSIS — N186 End stage renal disease: Secondary | ICD-10-CM | POA: Diagnosis not present

## 2014-07-17 DIAGNOSIS — Z992 Dependence on renal dialysis: Secondary | ICD-10-CM | POA: Diagnosis not present

## 2014-07-17 DIAGNOSIS — I238 Other current complications following acute myocardial infarction: Secondary | ICD-10-CM | POA: Diagnosis not present

## 2014-07-17 DIAGNOSIS — D631 Anemia in chronic kidney disease: Secondary | ICD-10-CM | POA: Diagnosis not present

## 2014-07-18 DIAGNOSIS — Z4781 Encounter for orthopedic aftercare following surgical amputation: Secondary | ICD-10-CM | POA: Diagnosis not present

## 2014-07-18 DIAGNOSIS — I238 Other current complications following acute myocardial infarction: Secondary | ICD-10-CM | POA: Diagnosis not present

## 2014-07-19 DIAGNOSIS — I120 Hypertensive chronic kidney disease with stage 5 chronic kidney disease or end stage renal disease: Secondary | ICD-10-CM | POA: Diagnosis not present

## 2014-07-19 DIAGNOSIS — E119 Type 2 diabetes mellitus without complications: Secondary | ICD-10-CM | POA: Diagnosis not present

## 2014-07-19 DIAGNOSIS — Z4801 Encounter for change or removal of surgical wound dressing: Secondary | ICD-10-CM | POA: Diagnosis not present

## 2014-07-19 DIAGNOSIS — Z89421 Acquired absence of other right toe(s): Secondary | ICD-10-CM | POA: Diagnosis not present

## 2014-07-19 DIAGNOSIS — I238 Other current complications following acute myocardial infarction: Secondary | ICD-10-CM | POA: Diagnosis not present

## 2014-07-19 DIAGNOSIS — N2581 Secondary hyperparathyroidism of renal origin: Secondary | ICD-10-CM | POA: Diagnosis not present

## 2014-07-19 DIAGNOSIS — D509 Iron deficiency anemia, unspecified: Secondary | ICD-10-CM | POA: Diagnosis not present

## 2014-07-19 DIAGNOSIS — Z4781 Encounter for orthopedic aftercare following surgical amputation: Secondary | ICD-10-CM | POA: Diagnosis not present

## 2014-07-19 DIAGNOSIS — Z992 Dependence on renal dialysis: Secondary | ICD-10-CM | POA: Diagnosis not present

## 2014-07-19 DIAGNOSIS — I509 Heart failure, unspecified: Secondary | ICD-10-CM | POA: Diagnosis not present

## 2014-07-19 DIAGNOSIS — D631 Anemia in chronic kidney disease: Secondary | ICD-10-CM | POA: Diagnosis not present

## 2014-07-19 DIAGNOSIS — N186 End stage renal disease: Secondary | ICD-10-CM | POA: Diagnosis not present

## 2014-07-20 DIAGNOSIS — I238 Other current complications following acute myocardial infarction: Secondary | ICD-10-CM | POA: Diagnosis not present

## 2014-07-21 DIAGNOSIS — I238 Other current complications following acute myocardial infarction: Secondary | ICD-10-CM | POA: Diagnosis not present

## 2014-07-21 DIAGNOSIS — Z992 Dependence on renal dialysis: Secondary | ICD-10-CM | POA: Diagnosis not present

## 2014-07-21 DIAGNOSIS — D509 Iron deficiency anemia, unspecified: Secondary | ICD-10-CM | POA: Diagnosis not present

## 2014-07-21 DIAGNOSIS — D631 Anemia in chronic kidney disease: Secondary | ICD-10-CM | POA: Diagnosis not present

## 2014-07-21 DIAGNOSIS — N2581 Secondary hyperparathyroidism of renal origin: Secondary | ICD-10-CM | POA: Diagnosis not present

## 2014-07-21 DIAGNOSIS — N186 End stage renal disease: Secondary | ICD-10-CM | POA: Diagnosis not present

## 2014-07-22 DIAGNOSIS — Z89411 Acquired absence of right great toe: Secondary | ICD-10-CM | POA: Diagnosis not present

## 2014-07-22 DIAGNOSIS — I8393 Asymptomatic varicose veins of bilateral lower extremities: Secondary | ICD-10-CM | POA: Diagnosis not present

## 2014-07-22 DIAGNOSIS — I238 Other current complications following acute myocardial infarction: Secondary | ICD-10-CM | POA: Diagnosis not present

## 2014-07-22 DIAGNOSIS — L84 Corns and callosities: Secondary | ICD-10-CM | POA: Diagnosis not present

## 2014-07-22 DIAGNOSIS — E11621 Type 2 diabetes mellitus with foot ulcer: Secondary | ICD-10-CM | POA: Diagnosis not present

## 2014-07-22 DIAGNOSIS — E119 Type 2 diabetes mellitus without complications: Secondary | ICD-10-CM | POA: Diagnosis not present

## 2014-07-22 DIAGNOSIS — L97514 Non-pressure chronic ulcer of other part of right foot with necrosis of bone: Secondary | ICD-10-CM | POA: Diagnosis not present

## 2014-07-22 DIAGNOSIS — Z992 Dependence on renal dialysis: Secondary | ICD-10-CM | POA: Diagnosis not present

## 2014-07-22 DIAGNOSIS — E1142 Type 2 diabetes mellitus with diabetic polyneuropathy: Secondary | ICD-10-CM | POA: Diagnosis not present

## 2014-07-22 DIAGNOSIS — L97519 Non-pressure chronic ulcer of other part of right foot with unspecified severity: Secondary | ICD-10-CM | POA: Diagnosis not present

## 2014-07-22 DIAGNOSIS — B351 Tinea unguium: Secondary | ICD-10-CM | POA: Diagnosis not present

## 2014-07-23 DIAGNOSIS — I238 Other current complications following acute myocardial infarction: Secondary | ICD-10-CM | POA: Diagnosis not present

## 2014-07-23 DIAGNOSIS — N186 End stage renal disease: Secondary | ICD-10-CM | POA: Diagnosis not present

## 2014-07-23 DIAGNOSIS — Z89421 Acquired absence of other right toe(s): Secondary | ICD-10-CM | POA: Diagnosis not present

## 2014-07-23 DIAGNOSIS — E119 Type 2 diabetes mellitus without complications: Secondary | ICD-10-CM | POA: Diagnosis not present

## 2014-07-23 DIAGNOSIS — Z992 Dependence on renal dialysis: Secondary | ICD-10-CM | POA: Diagnosis not present

## 2014-07-23 DIAGNOSIS — I120 Hypertensive chronic kidney disease with stage 5 chronic kidney disease or end stage renal disease: Secondary | ICD-10-CM | POA: Diagnosis not present

## 2014-07-23 DIAGNOSIS — Z4781 Encounter for orthopedic aftercare following surgical amputation: Secondary | ICD-10-CM | POA: Diagnosis not present

## 2014-07-23 DIAGNOSIS — Z4801 Encounter for change or removal of surgical wound dressing: Secondary | ICD-10-CM | POA: Diagnosis not present

## 2014-07-23 DIAGNOSIS — I509 Heart failure, unspecified: Secondary | ICD-10-CM | POA: Diagnosis not present

## 2014-07-24 DIAGNOSIS — D509 Iron deficiency anemia, unspecified: Secondary | ICD-10-CM | POA: Diagnosis not present

## 2014-07-24 DIAGNOSIS — Z992 Dependence on renal dialysis: Secondary | ICD-10-CM | POA: Diagnosis not present

## 2014-07-24 DIAGNOSIS — N186 End stage renal disease: Secondary | ICD-10-CM | POA: Diagnosis not present

## 2014-07-24 DIAGNOSIS — D631 Anemia in chronic kidney disease: Secondary | ICD-10-CM | POA: Diagnosis not present

## 2014-07-24 DIAGNOSIS — N2581 Secondary hyperparathyroidism of renal origin: Secondary | ICD-10-CM | POA: Diagnosis not present

## 2014-07-24 DIAGNOSIS — I238 Other current complications following acute myocardial infarction: Secondary | ICD-10-CM | POA: Diagnosis not present

## 2014-07-25 DIAGNOSIS — I238 Other current complications following acute myocardial infarction: Secondary | ICD-10-CM | POA: Diagnosis not present

## 2014-07-25 DIAGNOSIS — Z4801 Encounter for change or removal of surgical wound dressing: Secondary | ICD-10-CM | POA: Diagnosis not present

## 2014-07-25 DIAGNOSIS — I509 Heart failure, unspecified: Secondary | ICD-10-CM | POA: Diagnosis not present

## 2014-07-25 DIAGNOSIS — I120 Hypertensive chronic kidney disease with stage 5 chronic kidney disease or end stage renal disease: Secondary | ICD-10-CM | POA: Diagnosis not present

## 2014-07-25 DIAGNOSIS — N186 End stage renal disease: Secondary | ICD-10-CM | POA: Diagnosis not present

## 2014-07-25 DIAGNOSIS — Z992 Dependence on renal dialysis: Secondary | ICD-10-CM | POA: Diagnosis not present

## 2014-07-25 DIAGNOSIS — Z4781 Encounter for orthopedic aftercare following surgical amputation: Secondary | ICD-10-CM | POA: Diagnosis not present

## 2014-07-25 DIAGNOSIS — E119 Type 2 diabetes mellitus without complications: Secondary | ICD-10-CM | POA: Diagnosis not present

## 2014-07-25 DIAGNOSIS — Z89421 Acquired absence of other right toe(s): Secondary | ICD-10-CM | POA: Diagnosis not present

## 2014-07-26 DIAGNOSIS — I238 Other current complications following acute myocardial infarction: Secondary | ICD-10-CM | POA: Diagnosis not present

## 2014-07-26 DIAGNOSIS — N2581 Secondary hyperparathyroidism of renal origin: Secondary | ICD-10-CM | POA: Diagnosis not present

## 2014-07-26 DIAGNOSIS — D509 Iron deficiency anemia, unspecified: Secondary | ICD-10-CM | POA: Diagnosis not present

## 2014-07-26 DIAGNOSIS — N186 End stage renal disease: Secondary | ICD-10-CM | POA: Diagnosis not present

## 2014-07-26 DIAGNOSIS — Z992 Dependence on renal dialysis: Secondary | ICD-10-CM | POA: Diagnosis not present

## 2014-07-26 DIAGNOSIS — D631 Anemia in chronic kidney disease: Secondary | ICD-10-CM | POA: Diagnosis not present

## 2014-07-27 DIAGNOSIS — I238 Other current complications following acute myocardial infarction: Secondary | ICD-10-CM | POA: Diagnosis not present

## 2014-07-28 DIAGNOSIS — I238 Other current complications following acute myocardial infarction: Secondary | ICD-10-CM | POA: Diagnosis not present

## 2014-07-28 DIAGNOSIS — D509 Iron deficiency anemia, unspecified: Secondary | ICD-10-CM | POA: Diagnosis not present

## 2014-07-28 DIAGNOSIS — D631 Anemia in chronic kidney disease: Secondary | ICD-10-CM | POA: Diagnosis not present

## 2014-07-28 DIAGNOSIS — Z992 Dependence on renal dialysis: Secondary | ICD-10-CM | POA: Diagnosis not present

## 2014-07-28 DIAGNOSIS — N2581 Secondary hyperparathyroidism of renal origin: Secondary | ICD-10-CM | POA: Diagnosis not present

## 2014-07-28 DIAGNOSIS — Z4781 Encounter for orthopedic aftercare following surgical amputation: Secondary | ICD-10-CM | POA: Diagnosis not present

## 2014-07-28 DIAGNOSIS — N186 End stage renal disease: Secondary | ICD-10-CM | POA: Diagnosis not present

## 2014-07-29 ENCOUNTER — Encounter: Payer: Self-pay | Admitting: Vascular Surgery

## 2014-07-29 DIAGNOSIS — N186 End stage renal disease: Secondary | ICD-10-CM | POA: Diagnosis not present

## 2014-07-29 DIAGNOSIS — I120 Hypertensive chronic kidney disease with stage 5 chronic kidney disease or end stage renal disease: Secondary | ICD-10-CM | POA: Diagnosis not present

## 2014-07-29 DIAGNOSIS — Z853 Personal history of malignant neoplasm of breast: Secondary | ICD-10-CM | POA: Diagnosis not present

## 2014-07-29 DIAGNOSIS — Z9889 Other specified postprocedural states: Secondary | ICD-10-CM | POA: Diagnosis not present

## 2014-07-29 DIAGNOSIS — Z87891 Personal history of nicotine dependence: Secondary | ICD-10-CM | POA: Diagnosis not present

## 2014-07-29 DIAGNOSIS — I238 Other current complications following acute myocardial infarction: Secondary | ICD-10-CM | POA: Diagnosis not present

## 2014-07-29 DIAGNOSIS — Z5329 Procedure and treatment not carried out because of patient's decision for other reasons: Secondary | ICD-10-CM | POA: Diagnosis not present

## 2014-07-29 DIAGNOSIS — I509 Heart failure, unspecified: Secondary | ICD-10-CM | POA: Diagnosis not present

## 2014-07-29 DIAGNOSIS — Z992 Dependence on renal dialysis: Secondary | ICD-10-CM | POA: Diagnosis not present

## 2014-07-29 DIAGNOSIS — Z90710 Acquired absence of both cervix and uterus: Secondary | ICD-10-CM | POA: Diagnosis not present

## 2014-07-29 DIAGNOSIS — M1990 Unspecified osteoarthritis, unspecified site: Secondary | ICD-10-CM | POA: Diagnosis not present

## 2014-07-29 DIAGNOSIS — E119 Type 2 diabetes mellitus without complications: Secondary | ICD-10-CM | POA: Diagnosis not present

## 2014-07-30 ENCOUNTER — Ambulatory Visit (HOSPITAL_COMMUNITY)
Admission: RE | Admit: 2014-07-30 | Discharge: 2014-07-30 | Disposition: A | Discharge status: Home / Self Care | Payer: Medicare Other | Source: Ambulatory Visit | Attending: Vascular Surgery | Admitting: Vascular Surgery

## 2014-07-30 ENCOUNTER — Encounter: Payer: Self-pay | Admitting: Vascular Surgery

## 2014-07-30 ENCOUNTER — Ambulatory Visit (INDEPENDENT_AMBULATORY_CARE_PROVIDER_SITE_OTHER): Payer: Medicare Other | Admitting: Vascular Surgery

## 2014-07-30 ENCOUNTER — Other Ambulatory Visit: Payer: Self-pay

## 2014-07-30 VITALS — BP 152/63 | HR 73 | Ht 66.0 in | Wt 243.0 lb

## 2014-07-30 DIAGNOSIS — N186 End stage renal disease: Secondary | ICD-10-CM

## 2014-07-30 DIAGNOSIS — I70299 Other atherosclerosis of native arteries of extremities, unspecified extremity: Secondary | ICD-10-CM

## 2014-07-30 DIAGNOSIS — I238 Other current complications following acute myocardial infarction: Secondary | ICD-10-CM | POA: Diagnosis not present

## 2014-07-30 DIAGNOSIS — L97909 Non-pressure chronic ulcer of unspecified part of unspecified lower leg with unspecified severity: Secondary | ICD-10-CM | POA: Diagnosis not present

## 2014-07-30 NOTE — Progress Notes (Signed)
[facility]  Patient name: Alexis [name] MRN: [id number] DOB: January 02, 1946 Sex: female  REASON FOR CONSULT: Peripheral vascular disease with nonhealing right fifth toe amputation. Also needs hemodialysis access  HPI: Alexis [name] is a 69 y.o. female who underwent a ray amputation of the right great toe at [facility] in February. This has not healed and she was sent for vascular consultation. She denies any history of claudication although I think her activities fairly limited. She does describe symptoms consistent with rest pain in both feet. 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 She has a history of diabetes and also is on dialysis on Tuesdays Thursdays and Saturdays. X-ray of the foot was concerning for osteomyelitis of the right fifth toe. She also had severe degenerative changes throughout the remainder of the foot but no evidence of Charcot disease. She underwent a right foot fifth ray amputation on 06/16/2014. This has not healed adequately.  I did find an arterial Doppler study that showed an ABI on the right of 79% although this was likely falsely elevated because of calcific disease related to her diabetes and end-stage renal disease. The right great toe waveform was flat. ABI on the left was 70%. This was in February 2016.  In addition, she dialyzes on Tuesdays Thursdays and Saturdays and has a tunneled dialysis catheter. He was scheduled twice to have access put in her left arm but both times her surgery was canceled. She left the hospital apparently it 2 AM last night after waiting all day for surgery. She has decided she does not want to go back there for her access and is asked [place] to place access.   Past Medical History  Diagnosis Date  . Diabetes mellitus   . Hypertension   . Morbid obesity   . Coronary artery disease   . Chest pain   . 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 Hyperlipidemia   . Hx of breast cancer   . CHF  (congestive heart failure)   . SOB (shortness of breath)   . Chronic kidney disease   . Cancer 2005     left breast  . Diabetic nephropathy 01-08-13  . Proteinuria 01-08-13  . Dyslipidemia 01-08-13  . Thyroid disease 01-08-13    Hyper-parathyroidism-secondary  . Hx: UTI (urinary tract infection) 01-08-13   Family History  Problem Relation Age of Onset  . Breast cancer Mother     Died age 63  . Cancer Mother 20    Breast  . Heart disease Mother   . Hyperlipidemia Mother   . Hypertension Mother   . Heart attack Mother    SOCIAL HISTORY: History  Substance Use Topics  . Smoking status: Former Smoker -- 1.00 packs/day for 30 years    Types: Cigarettes    Quit date: 05/16/1998  . Smokeless tobacco: Never Used  . Alcohol Use: No   No Known Allergies Current Outpatient Prescriptions  Medication Sig Dispense Refill  . amLODipine (NORVASC) 10 MG tablet Take 10 mg by mouth every morning.     . benzonatate (TESSALON) 100 MG capsule Take 1 capsule (100 mg total) by mouth every 8 (eight) hours. 21 capsule 0  . carvedilol (COREG) 12.5 MG tablet Take 1 tablet (12.5 mg total) by mouth 2 (two) times daily. 60 tablet 6  . furosemide (LASIX) 40 MG tablet Take 40 mg by mouth every morning.    . hydrALAZINE (APRESOLINE) 50 MG  tablet 50 mg. Take 1 tablet (50 mg total) by mouth every 8 hours.    Marland Kitchen HYDROcodone-acetaminophen (HYCET) 7.5-325 mg/15 ml solution Take 15 mLs by mouth every 8 (eight) hours as needed for pain. 120 mL 0  . insulin aspart protamine-insulin aspart (NOVOLOG 70/30) (70-30) 100 UNIT/ML injection Inject 30-60 Units into the skin 2 (two) times daily with a meal. 60 UNITS IN THE MORNING AND 30 UNITS AT BEDTIME    . isosorbide mononitrate (IMDUR) 30 MG 24 hr tablet 30 mg. Take 1 tablet (30 mg total) by mouth daily.    . metoprolol succinate (TOPROL-XL) 50 MG 24 hr tablet Take 50 mg by mouth every morning. Take with or immediately following a meal.    . nitroGLYCERIN (NITROSTAT) 0.4 MG  SL tablet Place 0.4 mg under the tongue every 5 (five) minutes as needed for chest pain.    Marland Kitchen senna (SENOKOT) 8.6 MG tablet Take 2 tablets by mouth daily.     Marland Kitchen torsemide (DEMADEX) 20 MG tablet Take 20 mg by mouth daily.      No current facility-administered medications for this visit.   REVIEW OF SYSTEMS: Valu.Nieves ] denotes positive finding; [  ] denotes negative finding  CARDIOVASCULAR:  [ ]  chest pain   [ ]  chest pressure   [ ]  palpitations   [ ]  orthopnea   [ ]  dyspnea on exertion   [ ]  claudication   [ ]  rest pain   [ ]  DVT   [ ]  phlebitis PULMONARY:   [ ]  productive cough   [ ]  asthma   [ ]  wheezing NEUROLOGIC:   [ ]  weakness  [ ]  paresthesias  [ ]  aphasia  [ ]  amaurosis  [ ]  dizziness HEMATOLOGIC:   [ ]  bleeding problems   [ ]  clotting disorders MUSCULOSKELETAL:  [ ]  joint pain   [ ]  joint swelling Valu.Nieves ] leg swelling GASTROINTESTINAL: [ ]   blood in stool  [ ]   hematemesis GENITOURINARY:  [ ]   dysuria  [ ]   hematuria PSYCHIATRIC:  [ ]  history of major depression INTEGUMENTARY:  [ ]  rashes  Valu.Nieves ] ulcers CONSTITUTIONAL:  [ ]  fever   [ ]  chills  PHYSICAL EXAM: Filed Vitals:   07/30/14 1135  BP: 152/63  Pulse: 73  Height: 5\' 6"  (1.676 m)  Weight: 243 lb (110.224 kg)  SpO2: 100%   Body mass index is 39.24 kg/(m^2). GENERAL: The patient is a well-nourished female, in no acute distress. The vital signs are documented above. CARDIOVASCULAR: There is a regular rate and rhythm. I do not detect carotid bruits. She has palpable femoral pulses. I cannot palpate popliteal or pedal pulses. She has bilateral lower extremity swelling.  PULMONARY: There is good air exchange bilaterally without wheezing or rales. ABDOMEN: Soft and non-tender with normal pitched bowel sounds.  MUSCULOSKELETAL: She has undergone a fifth right toe amputation which is not healing. The wound is open. NEUROLOGIC: No focal weakness or paresthesias are detected. SKIN: She has hyperpigmentation bilaterally consistent with  chronic venous insufficiency. PSYCHIATRIC: The patient has a normal affect.  DATA:  The results of her Doppler study are described above.  I have independently interpreted her vein mapping left upper extremity. This shows That the forearm cephalic vein is fairly small. The upper arm cephalic vein might be adequate for a fistula. The basilic vein is not usable.  MEDICAL ISSUES:  PERIPHERAL VASCULAR DISEASE WITH NONHEALING RIGHT FIFTH TOE AMPUTATION SITE: This patient has a nonhealing wound  of the right foot. She has evidence of significant infrainguinal arterial occlusive disease bilaterally. Given her vascular disease, in addition to diabetes and end-stage renal disease, she is at high-risk for limb loss on the right. I think without revascularization the wound will not heal and she will require an amputation. For this reason, I have recommended that we proceed with an arteriogram. I have scheduled this for a non-dialysis  day. This has been scheduled for 08/11/2014. I have reviewed with the patient the indications for arteriography. In addition, I have reviewed the potential complications of arteriography including but not limited to: Bleeding, arterial injury, arterial thrombosis, dye action, renal insufficiency, or other unpredictable medical problems. I have explained to the patient that if we find disease amenable to angioplasty we could potentially address this at the same time. I have discussed the potential complications of angioplasty and stenting, including but not limited to: Bleeding, arterial thrombosis, arterial injury, dissection, or the need for surgical intervention. I will make further recommendations pending these results.  END-STAGE RENAL DISEASE: The patient has been canceled twice for placement of access in her left arm at [facility]. She will like [place] to perform her access. Based on her vein mapping, she may be a candidate for a left brachiocephalic AV fistula. Once I see the  results of her arteriogram we can decide on the timing of placement of a left brachiocephalic fistula versus an AV graft.   [facility]: [phone]

## 2014-07-31 DIAGNOSIS — I238 Other current complications following acute myocardial infarction: Secondary | ICD-10-CM | POA: Diagnosis not present

## 2014-07-31 DIAGNOSIS — N2581 Secondary hyperparathyroidism of renal origin: Secondary | ICD-10-CM | POA: Diagnosis not present

## 2014-07-31 DIAGNOSIS — N186 End stage renal disease: Secondary | ICD-10-CM | POA: Diagnosis not present

## 2014-07-31 DIAGNOSIS — D631 Anemia in chronic kidney disease: Secondary | ICD-10-CM | POA: Diagnosis not present

## 2014-07-31 DIAGNOSIS — Z992 Dependence on renal dialysis: Secondary | ICD-10-CM | POA: Diagnosis not present

## 2014-07-31 DIAGNOSIS — D509 Iron deficiency anemia, unspecified: Secondary | ICD-10-CM | POA: Diagnosis not present

## 2014-08-01 DIAGNOSIS — I238 Other current complications following acute myocardial infarction: Secondary | ICD-10-CM | POA: Diagnosis not present

## 2014-08-02 DIAGNOSIS — Z992 Dependence on renal dialysis: Secondary | ICD-10-CM | POA: Diagnosis not present

## 2014-08-02 DIAGNOSIS — D631 Anemia in chronic kidney disease: Secondary | ICD-10-CM | POA: Diagnosis not present

## 2014-08-02 DIAGNOSIS — N186 End stage renal disease: Secondary | ICD-10-CM | POA: Diagnosis not present

## 2014-08-02 DIAGNOSIS — D509 Iron deficiency anemia, unspecified: Secondary | ICD-10-CM | POA: Diagnosis not present

## 2014-08-02 DIAGNOSIS — I238 Other current complications following acute myocardial infarction: Secondary | ICD-10-CM | POA: Diagnosis not present

## 2014-08-02 DIAGNOSIS — N2581 Secondary hyperparathyroidism of renal origin: Secondary | ICD-10-CM | POA: Diagnosis not present

## 2014-08-03 DIAGNOSIS — I238 Other current complications following acute myocardial infarction: Secondary | ICD-10-CM | POA: Diagnosis not present

## 2014-08-04 DIAGNOSIS — I238 Other current complications following acute myocardial infarction: Secondary | ICD-10-CM | POA: Diagnosis not present

## 2014-08-05 DIAGNOSIS — N186 End stage renal disease: Secondary | ICD-10-CM | POA: Diagnosis not present

## 2014-08-05 DIAGNOSIS — I238 Other current complications following acute myocardial infarction: Secondary | ICD-10-CM | POA: Diagnosis not present

## 2014-08-05 DIAGNOSIS — Z992 Dependence on renal dialysis: Secondary | ICD-10-CM | POA: Diagnosis not present

## 2014-08-05 DIAGNOSIS — I2510 Atherosclerotic heart disease of native coronary artery without angina pectoris: Secondary | ICD-10-CM | POA: Diagnosis not present

## 2014-08-05 DIAGNOSIS — E1121 Type 2 diabetes mellitus with diabetic nephropathy: Secondary | ICD-10-CM | POA: Diagnosis not present

## 2014-08-05 DIAGNOSIS — N2581 Secondary hyperparathyroidism of renal origin: Secondary | ICD-10-CM | POA: Diagnosis not present

## 2014-08-05 DIAGNOSIS — I10 Essential (primary) hypertension: Secondary | ICD-10-CM | POA: Diagnosis not present

## 2014-08-05 DIAGNOSIS — L298 Other pruritus: Secondary | ICD-10-CM | POA: Diagnosis not present

## 2014-08-05 DIAGNOSIS — D631 Anemia in chronic kidney disease: Secondary | ICD-10-CM | POA: Diagnosis not present

## 2014-08-05 DIAGNOSIS — D509 Iron deficiency anemia, unspecified: Secondary | ICD-10-CM | POA: Diagnosis not present

## 2014-08-06 DIAGNOSIS — I238 Other current complications following acute myocardial infarction: Secondary | ICD-10-CM | POA: Diagnosis not present

## 2014-08-07 DIAGNOSIS — Z992 Dependence on renal dialysis: Secondary | ICD-10-CM | POA: Diagnosis not present

## 2014-08-07 DIAGNOSIS — N186 End stage renal disease: Secondary | ICD-10-CM | POA: Diagnosis not present

## 2014-08-07 DIAGNOSIS — N2581 Secondary hyperparathyroidism of renal origin: Secondary | ICD-10-CM | POA: Diagnosis not present

## 2014-08-07 DIAGNOSIS — D509 Iron deficiency anemia, unspecified: Secondary | ICD-10-CM | POA: Diagnosis not present

## 2014-08-07 DIAGNOSIS — D631 Anemia in chronic kidney disease: Secondary | ICD-10-CM | POA: Diagnosis not present

## 2014-08-07 DIAGNOSIS — I238 Other current complications following acute myocardial infarction: Secondary | ICD-10-CM | POA: Diagnosis not present

## 2014-08-08 DIAGNOSIS — Z89421 Acquired absence of other right toe(s): Secondary | ICD-10-CM | POA: Diagnosis not present

## 2014-08-08 DIAGNOSIS — I120 Hypertensive chronic kidney disease with stage 5 chronic kidney disease or end stage renal disease: Secondary | ICD-10-CM | POA: Diagnosis not present

## 2014-08-08 DIAGNOSIS — N186 End stage renal disease: Secondary | ICD-10-CM | POA: Diagnosis not present

## 2014-08-08 DIAGNOSIS — Z4801 Encounter for change or removal of surgical wound dressing: Secondary | ICD-10-CM | POA: Diagnosis not present

## 2014-08-08 DIAGNOSIS — E119 Type 2 diabetes mellitus without complications: Secondary | ICD-10-CM | POA: Diagnosis not present

## 2014-08-08 DIAGNOSIS — Z4781 Encounter for orthopedic aftercare following surgical amputation: Secondary | ICD-10-CM | POA: Diagnosis not present

## 2014-08-08 DIAGNOSIS — I238 Other current complications following acute myocardial infarction: Secondary | ICD-10-CM | POA: Diagnosis not present

## 2014-08-08 DIAGNOSIS — I509 Heart failure, unspecified: Secondary | ICD-10-CM | POA: Diagnosis not present

## 2014-08-08 DIAGNOSIS — Z992 Dependence on renal dialysis: Secondary | ICD-10-CM | POA: Diagnosis not present

## 2014-08-09 DIAGNOSIS — N186 End stage renal disease: Secondary | ICD-10-CM | POA: Diagnosis not present

## 2014-08-09 DIAGNOSIS — N2581 Secondary hyperparathyroidism of renal origin: Secondary | ICD-10-CM | POA: Diagnosis not present

## 2014-08-09 DIAGNOSIS — D509 Iron deficiency anemia, unspecified: Secondary | ICD-10-CM | POA: Diagnosis not present

## 2014-08-09 DIAGNOSIS — D631 Anemia in chronic kidney disease: Secondary | ICD-10-CM | POA: Diagnosis not present

## 2014-08-09 DIAGNOSIS — I238 Other current complications following acute myocardial infarction: Secondary | ICD-10-CM | POA: Diagnosis not present

## 2014-08-09 DIAGNOSIS — Z992 Dependence on renal dialysis: Secondary | ICD-10-CM | POA: Diagnosis not present

## 2014-08-10 DIAGNOSIS — I238 Other current complications following acute myocardial infarction: Secondary | ICD-10-CM | POA: Diagnosis not present

## 2014-08-10 MED ORDER — SODIUM CHLORIDE 0.9 % IJ SOLN: 3.0000 mL | INTRAMUSCULAR | Status: DC | PRN

## 2014-08-11 ENCOUNTER — Ambulatory Visit (HOSPITAL_COMMUNITY)
Admission: RE | Admit: 2014-08-11 | Discharge: 2014-08-11 | Disposition: A | Discharge status: Home / Self Care | Payer: Medicare Other | Source: Ambulatory Visit | Attending: Vascular Surgery | Admitting: Vascular Surgery

## 2014-08-11 ENCOUNTER — Encounter (HOSPITAL_COMMUNITY): Payer: Self-pay | Admitting: Vascular Surgery

## 2014-08-11 ENCOUNTER — Other Ambulatory Visit: Payer: Self-pay | Admitting: *Deleted

## 2014-08-11 ENCOUNTER — Encounter (HOSPITAL_COMMUNITY)
Admission: RE | Disposition: A | Discharge status: Home / Self Care | Payer: Self-pay | Source: Ambulatory Visit | Attending: Vascular Surgery

## 2014-08-11 DIAGNOSIS — I96 Gangrene, not elsewhere classified: Secondary | ICD-10-CM | POA: Insufficient documentation

## 2014-08-11 DIAGNOSIS — N2581 Secondary hyperparathyroidism of renal origin: Secondary | ICD-10-CM | POA: Diagnosis not present

## 2014-08-11 DIAGNOSIS — E1121 Type 2 diabetes mellitus with diabetic nephropathy: Secondary | ICD-10-CM | POA: Insufficient documentation

## 2014-08-11 DIAGNOSIS — Z87891 Personal history of nicotine dependence: Secondary | ICD-10-CM | POA: Insufficient documentation

## 2014-08-11 DIAGNOSIS — D631 Anemia in chronic kidney disease: Secondary | ICD-10-CM | POA: Diagnosis present

## 2014-08-11 DIAGNOSIS — T8189XA Other complications of procedures, not elsewhere classified, initial encounter: Secondary | ICD-10-CM | POA: Insufficient documentation

## 2014-08-11 DIAGNOSIS — Z853 Personal history of malignant neoplasm of breast: Secondary | ICD-10-CM | POA: Insufficient documentation

## 2014-08-11 DIAGNOSIS — I2510 Atherosclerotic heart disease of native coronary artery without angina pectoris: Secondary | ICD-10-CM | POA: Diagnosis not present

## 2014-08-11 DIAGNOSIS — Z992 Dependence on renal dialysis: Secondary | ICD-10-CM | POA: Insufficient documentation

## 2014-08-11 DIAGNOSIS — I771 Stricture of artery: Secondary | ICD-10-CM | POA: Insufficient documentation

## 2014-08-11 DIAGNOSIS — I70235 Atherosclerosis of native arteries of right leg with ulceration of other part of foot: Secondary | ICD-10-CM | POA: Diagnosis not present

## 2014-08-11 DIAGNOSIS — I129 Hypertensive chronic kidney disease with stage 1 through stage 4 chronic kidney disease, or unspecified chronic kidney disease: Secondary | ICD-10-CM | POA: Insufficient documentation

## 2014-08-11 DIAGNOSIS — Z87440 Personal history of urinary (tract) infections: Secondary | ICD-10-CM | POA: Diagnosis not present

## 2014-08-11 DIAGNOSIS — I252 Old myocardial infarction: Secondary | ICD-10-CM | POA: Diagnosis not present

## 2014-08-11 DIAGNOSIS — Z89411 Acquired absence of right great toe: Secondary | ICD-10-CM | POA: Insufficient documentation

## 2014-08-11 DIAGNOSIS — I238 Other current complications following acute myocardial infarction: Secondary | ICD-10-CM | POA: Diagnosis not present

## 2014-08-11 DIAGNOSIS — Z794 Long term (current) use of insulin: Secondary | ICD-10-CM | POA: Diagnosis not present

## 2014-08-11 DIAGNOSIS — N189 Chronic kidney disease, unspecified: Secondary | ICD-10-CM | POA: Insufficient documentation

## 2014-08-11 DIAGNOSIS — E785 Hyperlipidemia, unspecified: Secondary | ICD-10-CM | POA: Insufficient documentation

## 2014-08-11 DIAGNOSIS — Y658 Other specified misadventures during surgical and medical care: Secondary | ICD-10-CM | POA: Insufficient documentation

## 2014-08-11 HISTORY — PX: ABDOMINAL AORTAGRAM: SHX5454

## 2014-08-11 LAB — POCT I-STAT, CHEM 8
BUN: 37 mg/dL — ABNORMAL HIGH (ref 6–23)
CALCIUM ION: 1.03 mmol/L — AB (ref 1.13–1.30)
CREATININE: 3.4 mg/dL — AB (ref 0.50–1.10)
Chloride: 101 mmol/L (ref 96–112)
GLUCOSE: 72 mg/dL (ref 70–99)
HEMATOCRIT: 42 % (ref 36.0–46.0)
Hemoglobin: 14.3 g/dL (ref 12.0–15.0)
Potassium: 4.1 mmol/L (ref 3.5–5.1)
SODIUM: 137 mmol/L (ref 135–145)
TCO2: 25 mmol/L (ref 0–100)

## 2014-08-11 LAB — GLUCOSE, CAPILLARY: GLUCOSE-CAPILLARY: 70 mg/dL (ref 70–99)

## 2014-08-11 SURGERY — ABDOMINAL AORTAGRAM

## 2014-08-11 MED ORDER — MIDAZOLAM HCL 2 MG/2ML IJ SOLN
INTRAMUSCULAR | Status: AC
  Filled 2014-08-11: qty 2

## 2014-08-11 MED ORDER — LIDOCAINE HCL (PF) 1 % IJ SOLN
INTRAMUSCULAR | Status: AC
  Filled 2014-08-11: qty 30

## 2014-08-11 MED ORDER — HEPARIN (PORCINE) IN NACL 2-0.9 UNIT/ML-% IJ SOLN
INTRAMUSCULAR | Status: AC
  Filled 2014-08-11: qty 1000

## 2014-08-11 MED ORDER — FENTANYL CITRATE 0.05 MG/ML IJ SOLN
INTRAMUSCULAR | Status: AC
  Filled 2014-08-11: qty 2

## 2014-08-11 NOTE — Interval H&P Note (Signed)
History and Physical Interval Note:  08/11/2014 7:20 AM  Alexis [name]  has presented today for surgery, with the diagnosis of pvd  The various methods of treatment have been discussed with the patient and family. After consideration of risks, benefits and other options for treatment, the patient has consented to  Procedure(s): ABDOMINAL AORTAGRAM (N/A) as a surgical intervention .  The patient's history has been reviewed, patient examined, no change in status, stable for surgery.  I have reviewed the patient's chart and labs.  Questions were answered to the patient's satisfaction.     DICKSON,CHRISTOPHER S

## 2014-08-11 NOTE — Progress Notes (Signed)
Site area: right groin a 5 french arterial sheath was removed  Site Prior to Removal:  Level 0  Pressure Applied For 15 MINUTES    Minutes Beginning at 0905am  Manual:   Yes.    Patient Status During Pull:  stable  Post Pull Groin Site:  Level 0  Post Pull Instructions Given:  Yes.    Post Pull Pulses Present:  Yes.    Dressing Applied:  Yes.    Comments:  VS remain stable during sheath pull.  Pt denies any dis comfort at sit at this time.

## 2014-08-11 NOTE — Discharge Instructions (Signed)
Angiogram, Care After Refer to this sheet in the next few weeks. These instructions provide you with information on caring for yourself after your procedure. Your health care provider may also give you more specific instructions. Your treatment has been planned according to current medical practices, but problems sometimes occur. Call your health care provider if you have any problems or questions after your procedure.  WHAT TO EXPECT AFTER THE PROCEDURE After your procedure, it is typical to have the following sensations:  Minor discomfort or tenderness and a small bump at the catheter insertion site. The bump should usually decrease in size and tenderness within 1 to 2 weeks.  Any bruising will usually fade within 2 to 4 weeks. HOME CARE INSTRUCTIONS   You may need to keep taking blood thinners if they were prescribed for you. Take medicines only as directed by your health care provider.  Do not apply powder or lotion to the site.  Do not take baths, swim, or use a hot tub until your health care provider approves.  You may shower 24 hours after the procedure. Remove the bandage (dressing) and gently wash the site with plain soap and water. Gently pat the site dry.  Inspect the site at least twice daily.  Limit your activity for the first 48 hours. Do not bend, squat, or lift anything over 10 lb (9 kg) or as directed by your health care provider.  Plan to have someone take you home after the procedure. Follow instructions about when you can drive or return to work. SEEK MEDICAL CARE IF:  You get light-headed when standing up.  You have drainage (other than a small amount of blood on the dressing).  You have chills.  You have a fever.  You have redness, warmth, swelling, or pain at the insertion site. SEEK IMMEDIATE MEDICAL CARE IF:   You develop chest pain or shortness of breath, feel faint, or pass out.  You have bleeding, swelling larger than a walnut, or drainage from the  catheter insertion site.  You develop pain, discoloration, coldness, or severe bruising in the leg or arm that held the catheter.  You develop bleeding from any other place, such as the bowels. You may see bright red blood in your urine or stools, or your stools may appear black and tarry.  You have heavy bleeding from the site. If this happens, hold pressure on the site. MAKE SURE YOU:  Understand these instructions.  Will watch your condition.  Will get help right away if you are not doing well or get worse. Document Released: 11/18/2004 Document Revised: 09/16/2013 Document Reviewed: 09/24/2012 [facility] Patient Information 2015 [place], [place]. This information is not intended to replace advice given to you by your health care provider. Make sure you discuss any questions you have with your health care provider.

## 2014-08-11 NOTE — H&P (View-Only) (Signed)
Vascular and Vein Specialist of [facility]  Patient name: Alexis [name] MRN: [id number] DOB: 1945/07/18 Sex: female  REASON FOR CONSULT: Peripheral vascular disease with nonhealing right fifth toe amputation. Also needs hemodialysis access  HPI: Alexis [name] is a 69 y.o. female who underwent a ray amputation of the right great toe at [facility] in February. This has not healed and she was sent for vascular consultation. She denies any history of claudication although I think her activities fairly limited. She does describe symptoms consistent with rest pain in both feet. 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 She has a history of diabetes and also is on dialysis on Tuesdays Thursdays and Saturdays. X-ray of the foot was concerning for osteomyelitis of the right fifth toe. She also had severe degenerative changes throughout the remainder of the foot but no evidence of Charcot disease. She underwent a right foot fifth ray amputation on 06/16/2014. This has not healed adequately.  I did find an arterial Doppler study that showed an ABI on the right of 79% although this was likely falsely elevated because of calcific disease related to her diabetes and end-stage renal disease. The right great toe waveform was flat. ABI on the left was 70%. This was in February 2016.  In addition, she dialyzes on Tuesdays Thursdays and Saturdays and has a tunneled dialysis catheter. He was scheduled twice to have access put in her left arm but both times her surgery was canceled. She left the hospital apparently it 2 AM last night after waiting all day for surgery. She has decided she does not want to go back there for her access and is asked [place] to place access.   Past Medical History  Diagnosis Date  . Diabetes mellitus   . Hypertension   . Morbid obesity   . Coronary artery disease   . Chest pain   . 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 Hyperlipidemia   . Hx of breast cancer   . CHF  (congestive heart failure)   . SOB (shortness of breath)   . Chronic kidney disease   . Cancer 2005     left breast  . Diabetic nephropathy 01-08-13  . Proteinuria 01-08-13  . Dyslipidemia 01-08-13  . Thyroid disease 01-08-13    Hyper-parathyroidism-secondary  . Hx: UTI (urinary tract infection) 01-08-13   Family History  Problem Relation Age of Onset  . Breast cancer Mother     Died age 94  . Cancer Mother 61    Breast  . Heart disease Mother   . Hyperlipidemia Mother   . Hypertension Mother   . Heart attack Mother    SOCIAL HISTORY: History  Substance Use Topics  . Smoking status: Former Smoker -- 1.00 packs/day for 30 years    Types: Cigarettes    Quit date: 05/16/1998  . Smokeless tobacco: Never Used  . Alcohol Use: No   No Known Allergies Current Outpatient Prescriptions  Medication Sig Dispense Refill  . amLODipine (NORVASC) 10 MG tablet Take 10 mg by mouth every morning.     . benzonatate (TESSALON) 100 MG capsule Take 1 capsule (100 mg total) by mouth every 8 (eight) hours. 21 capsule 0  . carvedilol (COREG) 12.5 MG tablet Take 1 tablet (12.5 mg total) by mouth 2 (two) times daily. 60 tablet 6  . furosemide (LASIX) 40 MG tablet Take 40 mg by mouth every morning.    . hydrALAZINE (APRESOLINE) 50 MG  tablet 50 mg. Take 1 tablet (50 mg total) by mouth every 8 hours.    Marland Kitchen HYDROcodone-acetaminophen (HYCET) 7.5-325 mg/15 ml solution Take 15 mLs by mouth every 8 (eight) hours as needed for pain. 120 mL 0  . insulin aspart protamine-insulin aspart (NOVOLOG 70/30) (70-30) 100 UNIT/ML injection Inject 30-60 Units into the skin 2 (two) times daily with a meal. 60 UNITS IN THE MORNING AND 30 UNITS AT BEDTIME    . isosorbide mononitrate (IMDUR) 30 MG 24 hr tablet 30 mg. Take 1 tablet (30 mg total) by mouth daily.    . metoprolol succinate (TOPROL-XL) 50 MG 24 hr tablet Take 50 mg by mouth every morning. Take with or immediately following a meal.    . nitroGLYCERIN (NITROSTAT) 0.4 MG  SL tablet Place 0.4 mg under the tongue every 5 (five) minutes as needed for chest pain.    Marland Kitchen senna (SENOKOT) 8.6 MG tablet Take 2 tablets by mouth daily.     Marland Kitchen torsemide (DEMADEX) 20 MG tablet Take 20 mg by mouth daily.      No current facility-administered medications for this visit.   REVIEW OF SYSTEMS: Valu.Nieves ] denotes positive finding; [  ] denotes negative finding  CARDIOVASCULAR:  [ ]  chest pain   [ ]  chest pressure   [ ]  palpitations   [ ]  orthopnea   [ ]  dyspnea on exertion   [ ]  claudication   [ ]  rest pain   [ ]  DVT   [ ]  phlebitis PULMONARY:   [ ]  productive cough   [ ]  asthma   [ ]  wheezing NEUROLOGIC:   [ ]  weakness  [ ]  paresthesias  [ ]  aphasia  [ ]  amaurosis  [ ]  dizziness HEMATOLOGIC:   [ ]  bleeding problems   [ ]  clotting disorders MUSCULOSKELETAL:  [ ]  joint pain   [ ]  joint swelling Valu.Nieves ] leg swelling GASTROINTESTINAL: [ ]   blood in stool  [ ]   hematemesis GENITOURINARY:  [ ]   dysuria  [ ]   hematuria PSYCHIATRIC:  [ ]  history of major depression INTEGUMENTARY:  [ ]  rashes  Valu.Nieves ] ulcers CONSTITUTIONAL:  [ ]  fever   [ ]  chills  PHYSICAL EXAM: Filed Vitals:   07/30/14 1135  BP: 152/63  Pulse: 73  Height: 5\' 6"  (1.676 m)  Weight: 243 lb (110.224 kg)  SpO2: 100%   Body mass index is 39.24 kg/(m^2). GENERAL: The patient is a well-nourished female, in no acute distress. The vital signs are documented above. CARDIOVASCULAR: There is a regular rate and rhythm. I do not detect carotid bruits. She has palpable femoral pulses. I cannot palpate popliteal or pedal pulses. She has bilateral lower extremity swelling.  PULMONARY: There is good air exchange bilaterally without wheezing or rales. ABDOMEN: Soft and non-tender with normal pitched bowel sounds.  MUSCULOSKELETAL: She has undergone a fifth right toe amputation which is not healing. The wound is open. NEUROLOGIC: No focal weakness or paresthesias are detected. SKIN: She has hyperpigmentation bilaterally consistent with  chronic venous insufficiency. PSYCHIATRIC: The patient has a normal affect.  DATA:  The results of her Doppler study are described above.  I have independently interpreted her vein mapping left upper extremity. This shows That the forearm cephalic vein is fairly small. The upper arm cephalic vein might be adequate for a fistula. The basilic vein is not usable.  MEDICAL ISSUES:  PERIPHERAL VASCULAR DISEASE WITH NONHEALING RIGHT FIFTH TOE AMPUTATION SITE: This patient has a nonhealing wound  of the right foot. She has evidence of significant infrainguinal arterial occlusive disease bilaterally. Given her vascular disease, in addition to diabetes and end-stage renal disease, she is at high-risk for limb loss on the right. I think without revascularization the wound will not heal and she will require an amputation. For this reason, I have recommended that we proceed with an arteriogram. I have scheduled this for a non-dialysis  day. This has been scheduled for 08/11/2014. I have reviewed with the patient the indications for arteriography. In addition, I have reviewed the potential complications of arteriography including but not limited to: Bleeding, arterial injury, arterial thrombosis, dye action, renal insufficiency, or other unpredictable medical problems. I have explained to the patient that if we find disease amenable to angioplasty we could potentially address this at the same time. I have discussed the potential complications of angioplasty and stenting, including but not limited to: Bleeding, arterial thrombosis, arterial injury, dissection, or the need for surgical intervention. I will make further recommendations pending these results.  END-STAGE RENAL DISEASE: The patient has been canceled twice for placement of access in her left arm at [facility]. She will like [place] to perform her access. Based on her vein mapping, she may be a candidate for a left brachiocephalic AV fistula. Once I see the  results of her arteriogram we can decide on the timing of placement of a left brachiocephalic fistula versus an AV graft.   [place] [facility] [place] [employer]: [phone]

## 2014-08-11 NOTE — Op Note (Signed)
   PATIENT: Alexis [name]   MRN: [id number] DOB: 1945-08-14    DATE OF PROCEDURE: 08/11/2014  INDICATIONS: MERDIS SLATE is a 69 y.o. female who presented with a nonhealing wound of her right fifth toe. She presents for arteriography.  PROCEDURE:  1. Ultrasound-guided access to the right common femoral artery 2. Aortogram with bilateral iliac arteriogram 3. Selective catheterization of the left external iliac artery with left lower extremity runoff 4. Retrograde right femoral arteriogram  SURGEON: Judeth Cornfield. Scot Dock, MD, FACS  ANESTHESIA: local with sedation   EBL: minimal  TECHNIQUE: The patient was taken to the peripheral vascular lab and was sedated with 1 mg of Versed and 50 g of fentanyl. Both groins are prepped and draped in usual sterile fashion. Under ultrasound guidance, after the skin was anesthetized, I attempted to cannulate the left common femoral artery but despite multiple attempts, because of the depth, was unable to cannulate it. I therefore elected to try the right side. Under ultrasound guidance, after the skin was anesthetized, the right common femoral artery was cannulated with a micro-puncture needle and a micropuncture sheath introduced over the wire. This was then exchanged for a 5 [place] sheath over a [employer] wire. Pigtail catheter was positioned at the L1 vertebral body and flush aortogram obtained. The catheter was then positioned just above the aortic bifurcation and oblique iliac projection was obtained. Next the pigtail catheter was exchanged for a crossover catheter which was positioned into the left common iliac artery. The wire was advanced down into the common femoral artery and then the crossover catheter advanced. I then exchanged the [facility] wire for a Rosen wire and then the crossover catheter exchanged for a straight catheter. Selective left external iliac arteriogram was obtained with left lower extremity runoff next the straight catheter was removed and  retrograde right femoral arteriogram was obtained. The patient tolerated the procedure well and was transferred to the holding area for removal of the sheath.  FINDINGS:  1. There are single renal arteries bilaterally with no significant renal artery stenosis identified. The infrarenal aorta, bilateral common iliac arteries, bilateral external iliac arteries, and bilateral hypogastric arteries are patent. 2. On the right side, which is the site of concern, the common femoral and deep femoral artery are patent. There is mild diffuse disease throughout the right superficial femoral artery but no focal critical stenosis. The palpatory artery is patent. The anterior tibial artery on the right is occluded. The tibial peroneal trunk, peroneal, and posterior tibial arteries are patent. 3. On the left side, the common femoral and deep femoral artery are patent. The superficial femoral artery is occluded at its origin with reconstitution of the palpatory artery at the level of the knee. There is two-vessel runoff on the left via the posterior tibial and peroneal arteries. The anterior tibial artery is occluded.  CLINICAL NOTE: The patient appears to have adequate circulation to heal the wound on the right toe. I will follow her in the office and also arrange for new access in the left arm for dialysis.  [name], MD, FACS Vascular and Vein Specialists of [facility]  DATE OF DICTATION:   08/11/2014

## 2014-08-12 ENCOUNTER — Encounter: Payer: Self-pay | Admitting: Vascular Surgery

## 2014-08-12 ENCOUNTER — Telehealth: Payer: Self-pay | Admitting: Vascular Surgery

## 2014-08-12 DIAGNOSIS — D509 Iron deficiency anemia, unspecified: Secondary | ICD-10-CM | POA: Diagnosis not present

## 2014-08-12 DIAGNOSIS — I238 Other current complications following acute myocardial infarction: Secondary | ICD-10-CM | POA: Diagnosis not present

## 2014-08-12 DIAGNOSIS — D631 Anemia in chronic kidney disease: Secondary | ICD-10-CM | POA: Diagnosis not present

## 2014-08-12 DIAGNOSIS — N186 End stage renal disease: Secondary | ICD-10-CM | POA: Diagnosis not present

## 2014-08-12 DIAGNOSIS — N2581 Secondary hyperparathyroidism of renal origin: Secondary | ICD-10-CM | POA: Diagnosis not present

## 2014-08-12 DIAGNOSIS — Z992 Dependence on renal dialysis: Secondary | ICD-10-CM | POA: Diagnosis not present

## 2014-08-12 NOTE — Telephone Encounter (Signed)
LM for patient at home # to inform of 03/30 appt- asked that pt call back with confirmation, dpm

## 2014-08-12 NOTE — Telephone Encounter (Signed)
-----   Message from Mena Goes, RN sent at 08/11/2014  2:51 PM EDT ----- Regarding: Schedule   ----- Message -----    From: Angelia Mould, MD    Sent: 08/11/2014   8:57 AM      To: Vvs Charge Pool Subject: charge and schedule                            PROCEDURE:  1. Ultrasound-guided access to the right common femoral artery 2. Aortogram with bilateral iliac arteriogram 3. Selective catheterization of the left external iliac artery with left lower extremity runoff 4. Retrograde right femoral arteriogram  SURGEON: Judeth Cornfield. Scot Dock, MD, FACS  She needs to be scheduled for a left brachiocephalic fistula or AV graft but I'll need to look at my schedule with him in the office on Wednesday. Thank you she dialyzes on Tuesdays Thursdays and Saturdays so hostile look for a Friday. Thanks  CD

## 2014-08-13 ENCOUNTER — Encounter: Payer: Self-pay | Admitting: Vascular Surgery

## 2014-08-13 ENCOUNTER — Ambulatory Visit (INDEPENDENT_AMBULATORY_CARE_PROVIDER_SITE_OTHER): Payer: Self-pay | Admitting: Vascular Surgery

## 2014-08-13 VITALS — BP 168/74 | HR 76 | Temp 98.8°F | Resp 18 | Ht 66.0 in | Wt 220.0 lb

## 2014-08-13 DIAGNOSIS — I238 Other current complications following acute myocardial infarction: Secondary | ICD-10-CM | POA: Diagnosis not present

## 2014-08-13 DIAGNOSIS — N186 End stage renal disease: Secondary | ICD-10-CM

## 2014-08-13 NOTE — Telephone Encounter (Signed)
Spoke with patient to confirm, she said that she never received a message from [place]- but she called back. She said we should contact her to schedule, but gave me her daughters phone number. Please clarify when speaking with daughter if we should contact patient as well. dpm

## 2014-08-13 NOTE — Progress Notes (Signed)
Vascular and Vein Specialist of [facility]  Patient name: Alexis [name] MRN: [id number] DOB: 1946/01/03 Sex: female  REASON FOR VISIT: to evaluate for new access.  HPI: Alexis [name] is a 69 y.o. female who presented with a nonhealing liver right fifth toe. She underwent an arteriogram on 08/11/2014. On the right side, which is the site of concern, the common femoral and deep femoral artery are patent. There is mild diffuse disease throughout the right superficial femoral artery but no focal critical stenosis. The popliteal artery is patent. The anterior tibial artery on the right is occluded. The tibial peroneal trunk, peroneal, and posterior tibial arteries are patent. Based on the arteriogram I thought she had adequate circulation to heal the wound on the right toe. She comes into the office to arrange new access in her left arm for dialysis.  She dialyzes on Tuesdays Thursdays and Saturdays and has a tunneled dialysis catheter. She presented to have access done in her left arm twice at [facility] and her surgery was canceled. Apparently at one point she had to leave the hospital at 2 AM after waiting all day for surgery. For this reason she is elected to have her surgery placed here.  Her previous vein map showed that the forearm cephalic vein on the left was fairly small. The upper arm cephalic vein might be adequate for a fistula. The basilic vein was not usable.based on the vein mapping, I thought she might be a candidate for a left brachiocephalic AV fistula. If this were not possible she would require an AV graft.   Past Medical History  Diagnosis Date  . Diabetes mellitus   . Hypertension   . Morbid obesity   . Coronary artery disease   . Chest pain   . 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 Hyperlipidemia   . Hx of breast cancer   . CHF (congestive heart failure)   . SOB (shortness of breath)   . Chronic kidney disease   . Cancer 2005     left breast  . Diabetic nephropathy 01-08-13  .  Proteinuria 01-08-13  . Dyslipidemia 01-08-13  . Thyroid disease 01-08-13    Hyper-parathyroidism-secondary  . Hx: UTI (urinary tract infection) 01-08-13   Family History  Problem Relation Age of Onset  . Breast cancer Mother     Died age 75  . Cancer Mother 88    Breast  . Heart disease Mother   . Hyperlipidemia Mother   . Hypertension Mother   . Heart attack Mother    SOCIAL HISTORY: History  Substance Use Topics  . Smoking status: Former Smoker -- 1.00 packs/day for 30 years    Types: Cigarettes    Quit date: 05/16/1998  . Smokeless tobacco: Never Used  . Alcohol Use: No   No Known Allergies Current Outpatient Prescriptions  Medication Sig Dispense Refill  . carvedilol (COREG) 25 MG tablet Take 25 mg by mouth 2 (two) times daily with a meal.    . docusate sodium (COLACE) 100 MG capsule Take 100 mg by mouth daily as needed for mild constipation.     . hydrALAZINE (APRESOLINE) 100 MG tablet Take 100 mg by mouth 3 (three) times daily.    . hydrOXYzine (ATARAX/VISTARIL) 25 MG tablet Take 25 mg by mouth 2 (two) times daily as needed for itching.    . insulin aspart protamine-insulin aspart (NOVOLOG 70/30) (70-30) 100 UNIT/ML injection Inject 30-60 Units into the skin 2 (two) times daily  with a meal. 60 UNITS IN THE MORNING AND 30 UNITS AT BEDTIME    . isosorbide mononitrate (IMDUR) 30 MG 24 hr tablet Take 30 mg by mouth daily. Take 1 tablet (30 mg total) by mouth daily.    . nitroGLYCERIN (NITROSTAT) 0.4 MG SL tablet Place 0.4 mg under the tongue every 5 (five) minutes as needed for chest pain.    . sevelamer carbonate (RENVELA) 800 MG tablet Take 800 mg by mouth 3 (three) times daily with meals.      No current facility-administered medications for this visit.   REVIEW OF SYSTEMS: Arly.Keller ] denotes positive finding; [  ] denotes negative finding  CARDIOVASCULAR:  [ ]  chest pain   [ ]  chest pressure   [ ]  palpitations   [ ]  orthopnea   [ ]  dyspnea on exertion   [ ]  claudication   [ ]   rest pain   [ ]  DVT   [ ]  phlebitis PULMONARY:   [ ]  productive cough   [ ]  asthma   [ ]  wheezing NEUROLOGIC:   [ ]  weakness  [ ]  paresthesias  [ ]  aphasia  [ ]  amaurosis  [ ]  dizziness HEMATOLOGIC:   [ ]  bleeding problems   [ ]  clotting disorders MUSCULOSKELETAL:  [ ]  joint pain   [ ]  joint swelling [ ]  leg swelling GASTROINTESTINAL: [ ]   blood in stool  [ ]   hematemesis GENITOURINARY:  [ ]   dysuria  [ ]   hematuria PSYCHIATRIC:  [ ]  history of major depression INTEGUMENTARY:  [ ]  rashes  [ ]  ulcers CONSTITUTIONAL:  [ ]  fever   [ ]  chills  PHYSICAL EXAM: Filed Vitals:   08/13/14 1603  BP: 168/74  Pulse: 76  Temp: 98.8 F (37.1 C)  TempSrc: Oral  Resp: 18  Height: 5\' 6"  (1.676 m)  Weight: 220 lb (99.791 kg)  SpO2: 99%   GENERAL: The patient is a well-nourished female, in no acute distress. The vital signs are documented above. CARDIOVASCULAR: There is a regular rate and rhythm. He has a palpable radial pulse bilaterally. PULMONARY: There is good air exchange bilaterally without wheezing or rales. ABDOMEN: Soft and non-tender with normal pitched bowel sounds.  MUSCULOSKELETAL: There are no major deformities or cyanosis. NEUROLOGIC: No focal weakness or paresthesias are detected. SKIN: the wound on the right foot Has improved significantly. PSYCHIATRIC: The patient has a normal affect.  DATA:  Arteriogram and vein mapping are described above.  MEDICAL ISSUES:  END-STAGE RENAL DISEASE: I have recommended we explore her upper arm cephalic vein on the left and hopefully place a left brachiocephalic AV fistula. If this is not adequate then she would need an AV graft. I have explained the indications for placement of an AV fistula or AV graft. I've explained that if at all possible we will place an AV fistula.  I have reviewed the risks of placement of an AV fistula including but not limited to: failure of the fistula to mature, need for subsequent interventions, and thrombosis. In  addition I have reviewed the potential complications of placement of an AV graft. These risks include, but are not limited to, graft thrombosis, graft infection, wound healing problems, bleeding, arm swelling, and steal syndrome. All the patient's questions were answered and they are agreeable to proceed with surgery. Her surgery is scheduled for 08/21/2014. She normally dialyzes on Tuesdays Thursdays and Saturdays and she will change her dialysis day.    Alaiah Lundy S Vascular and Vein Specialists  of [employer] [place] [employer]: [phone]

## 2014-08-14 ENCOUNTER — Other Ambulatory Visit: Payer: Self-pay

## 2014-08-14 ENCOUNTER — Encounter: Payer: Self-pay | Admitting: Vascular Surgery

## 2014-08-14 DIAGNOSIS — D509 Iron deficiency anemia, unspecified: Secondary | ICD-10-CM | POA: Diagnosis not present

## 2014-08-14 DIAGNOSIS — Z992 Dependence on renal dialysis: Secondary | ICD-10-CM | POA: Diagnosis not present

## 2014-08-14 DIAGNOSIS — I238 Other current complications following acute myocardial infarction: Secondary | ICD-10-CM | POA: Diagnosis not present

## 2014-08-14 DIAGNOSIS — N186 End stage renal disease: Secondary | ICD-10-CM | POA: Diagnosis not present

## 2014-08-14 DIAGNOSIS — N2581 Secondary hyperparathyroidism of renal origin: Secondary | ICD-10-CM | POA: Diagnosis not present

## 2014-08-14 DIAGNOSIS — D631 Anemia in chronic kidney disease: Secondary | ICD-10-CM | POA: Diagnosis not present

## 2014-08-15 DIAGNOSIS — I120 Hypertensive chronic kidney disease with stage 5 chronic kidney disease or end stage renal disease: Secondary | ICD-10-CM | POA: Diagnosis not present

## 2014-08-15 DIAGNOSIS — E119 Type 2 diabetes mellitus without complications: Secondary | ICD-10-CM | POA: Diagnosis not present

## 2014-08-15 DIAGNOSIS — Z992 Dependence on renal dialysis: Secondary | ICD-10-CM | POA: Diagnosis not present

## 2014-08-15 DIAGNOSIS — N186 End stage renal disease: Secondary | ICD-10-CM | POA: Diagnosis not present

## 2014-08-15 DIAGNOSIS — Z4801 Encounter for change or removal of surgical wound dressing: Secondary | ICD-10-CM | POA: Diagnosis not present

## 2014-08-15 DIAGNOSIS — I238 Other current complications following acute myocardial infarction: Secondary | ICD-10-CM | POA: Diagnosis not present

## 2014-08-15 DIAGNOSIS — Z4781 Encounter for orthopedic aftercare following surgical amputation: Secondary | ICD-10-CM | POA: Diagnosis not present

## 2014-08-15 DIAGNOSIS — Z89421 Acquired absence of other right toe(s): Secondary | ICD-10-CM | POA: Diagnosis not present

## 2014-08-15 DIAGNOSIS — I509 Heart failure, unspecified: Secondary | ICD-10-CM | POA: Diagnosis not present

## 2014-08-16 DIAGNOSIS — N2581 Secondary hyperparathyroidism of renal origin: Secondary | ICD-10-CM | POA: Diagnosis not present

## 2014-08-16 DIAGNOSIS — N186 End stage renal disease: Secondary | ICD-10-CM | POA: Diagnosis not present

## 2014-08-16 DIAGNOSIS — D631 Anemia in chronic kidney disease: Secondary | ICD-10-CM | POA: Diagnosis not present

## 2014-08-16 DIAGNOSIS — I238 Other current complications following acute myocardial infarction: Secondary | ICD-10-CM | POA: Diagnosis not present

## 2014-08-16 DIAGNOSIS — D509 Iron deficiency anemia, unspecified: Secondary | ICD-10-CM | POA: Diagnosis not present

## 2014-08-16 DIAGNOSIS — Z992 Dependence on renal dialysis: Secondary | ICD-10-CM | POA: Diagnosis not present

## 2014-08-17 DIAGNOSIS — I238 Other current complications following acute myocardial infarction: Secondary | ICD-10-CM | POA: Diagnosis not present

## 2014-08-18 DIAGNOSIS — I238 Other current complications following acute myocardial infarction: Secondary | ICD-10-CM | POA: Diagnosis not present

## 2014-08-19 DIAGNOSIS — D631 Anemia in chronic kidney disease: Secondary | ICD-10-CM | POA: Diagnosis not present

## 2014-08-19 DIAGNOSIS — N2581 Secondary hyperparathyroidism of renal origin: Secondary | ICD-10-CM | POA: Diagnosis not present

## 2014-08-19 DIAGNOSIS — N186 End stage renal disease: Secondary | ICD-10-CM | POA: Diagnosis not present

## 2014-08-19 DIAGNOSIS — Z992 Dependence on renal dialysis: Secondary | ICD-10-CM | POA: Diagnosis not present

## 2014-08-19 DIAGNOSIS — I238 Other current complications following acute myocardial infarction: Secondary | ICD-10-CM | POA: Diagnosis not present

## 2014-08-19 DIAGNOSIS — D509 Iron deficiency anemia, unspecified: Secondary | ICD-10-CM | POA: Diagnosis not present

## 2014-08-20 ENCOUNTER — Encounter (HOSPITAL_COMMUNITY): Payer: Self-pay | Admitting: *Deleted

## 2014-08-20 DIAGNOSIS — D509 Iron deficiency anemia, unspecified: Secondary | ICD-10-CM | POA: Diagnosis not present

## 2014-08-20 DIAGNOSIS — Z4781 Encounter for orthopedic aftercare following surgical amputation: Secondary | ICD-10-CM | POA: Diagnosis not present

## 2014-08-20 DIAGNOSIS — Z992 Dependence on renal dialysis: Secondary | ICD-10-CM | POA: Diagnosis not present

## 2014-08-20 DIAGNOSIS — N186 End stage renal disease: Secondary | ICD-10-CM | POA: Diagnosis not present

## 2014-08-20 DIAGNOSIS — I509 Heart failure, unspecified: Secondary | ICD-10-CM | POA: Diagnosis not present

## 2014-08-20 DIAGNOSIS — E119 Type 2 diabetes mellitus without complications: Secondary | ICD-10-CM | POA: Diagnosis not present

## 2014-08-20 DIAGNOSIS — D631 Anemia in chronic kidney disease: Secondary | ICD-10-CM | POA: Diagnosis not present

## 2014-08-20 DIAGNOSIS — Z89421 Acquired absence of other right toe(s): Secondary | ICD-10-CM | POA: Diagnosis not present

## 2014-08-20 DIAGNOSIS — N2581 Secondary hyperparathyroidism of renal origin: Secondary | ICD-10-CM | POA: Diagnosis not present

## 2014-08-20 DIAGNOSIS — Z4801 Encounter for change or removal of surgical wound dressing: Secondary | ICD-10-CM | POA: Diagnosis not present

## 2014-08-20 DIAGNOSIS — I238 Other current complications following acute myocardial infarction: Secondary | ICD-10-CM | POA: Diagnosis not present

## 2014-08-20 DIAGNOSIS — I120 Hypertensive chronic kidney disease with stage 5 chronic kidney disease or end stage renal disease: Secondary | ICD-10-CM | POA: Diagnosis not present

## 2014-08-20 MED ORDER — DEXTROSE 5 % IV SOLN: 1.5000 g | INTRAVENOUS | Status: DC

## 2014-08-20 MED ORDER — SODIUM CHLORIDE 0.9 % IV SOLN
INTRAVENOUS | Status: DC
  Administered 2014-08-21: 14:00:00 via INTRAVENOUS

## 2014-08-20 MED ORDER — CHLORHEXIDINE GLUCONATE CLOTH 2 % EX PADS: 6.0000 | MEDICATED_PAD | Freq: Once | CUTANEOUS | Status: DC

## 2014-08-20 NOTE — Progress Notes (Signed)
Pt denies any recent chest pain or sob.

## 2014-08-20 NOTE — Progress Notes (Signed)
Anesthesia Chart Review: SAME DAY WORK-UP.  Patient is a 69 year old female scheduled for LUE AVF versus AVGG tomorrow by Dr. Scot Dock.  Case is posted for MAC. Dr. Nicole Cella note indicates that procedure was scheduled twice at [facility] but surgery was canceled, even one day having to leave at [place] still without surgery. For this reason, she chose to come to [facility].  History includes former smoker, ESRD on HD TTS (R IJ Diatek 03/24/14 at [facility]), DM2, HTN, CAD/MI s/p stent '05 (in [place] or [place]), chest pain, chronic systolic CHF, SOB,  HLD, left breast cancer s/p mastectomy, PAD with right 5th toe wound '16,  Last BMI in Epic is recorded as 35.5. Last cardiology visit seen is with Dr. Eileen Stanford at [facility] (Care Everywhere) on 04/03/14.  No new cardiac testing was recommended at that time. He recommended further dialysis and PT with one year cardiology follow-up. She was last seen in [facility] by Dr. Acie Fredrickson in 08/2012.  Nephrologist is Dr. Lowanda Foster. PCP is Dr. Nolene Ebbs.  03/18/14 Echo [facility]; Care Everywhere): SUMMARY The left ventricle is mildly dilated. There is normal left ventricular wall thickness. LV ejection fraction = 40-45%. Left ventricular systolic function is mild to moderately reduced. Mild to moderate global hypokinesis of the left ventricle. Left ventricular filling pattern is impaired. The right ventricle is borderline dilated. The left atrium is mildly dilated. 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 There is aortic valve sclerosis. No aortic regurgitation is present. There is mild mitral valve thickening. There is moderate mitral regurgitation. There is severe tricuspid regurgitation. The tricuspid regurgitant jet is eccentrically directed. Trivial pericardial effusion. In comparison with the prior study LV function has improved (30-35% on 10/21/12).  08/11/14 EKG: NSR, LAD, LBBB. QRS has widened since her 08/19/12 EKG in Epic. EKG was done on 07/02/14  at [facility], but no tracing or report is viewable in [place]. EKG report from 03/23/14 from [facility] read: SR, low voltage QRS, consider pulmonary disease or obesity, LAD, T wave abnormality, consider lateral ischemia, poor r wave progression. EKG there on 03/22/14 read: SR, LAD, incomplete LBBB, non-specific T wave changes.   1V CXR 03/17/14 (Care Everywhere): FINDINGS: Lines/Drains: None Abdomen: Unremarkable Bones/Soft tissues: Surgical clips in the left axilla. No acute osseous deformity.  Cardiovascular/Mediastinum: Enlarged cardiopericardial silhouette, similar to prior.  Lungs/Pleura: Prominent pulmonary vasculature without interstitial edema. Negative for pneumonia, masses. No pleural effusion or pneumothorax.   She is for labs on arrival.   Discussed above with anesthesiologist Dr. Kalman Shan.  Patient with known CAD and CHF.  She recently began hemodialysis via a tunneled catheter, but needs permanent HD access. MAC anesthesia is anticipated.  She saw her cardiologist ~ 6 months ago and no new testing ordered.  She will be further evaluated by her assigned anesthesiologist on the day of surgery.  If labs are acceptable and no acute CV/CHF symptoms then it is anticipated that she can proceed with this procedure.   George Hugh [facility]/Anesthesiology Phone [phone] 08/20/2014 2:27 PM

## 2014-08-20 NOTE — Anesthesia Preprocedure Evaluation (Addendum)
Anesthesia Evaluation  Patient identified by MRN, date of birth, ID band Patient awake    Reviewed: Allergy & Precautions, NPO status , Patient's Chart, lab work & pertinent test results, reviewed documented beta blocker date and time   Airway Mallampati: II   Neck ROM: Full    Dental  (+) Edentulous Upper, Dental Advisory Given   Pulmonary COPDformer smoker (quit 2000 30 pack year),  breath sounds clear to auscultation        Cardiovascular hypertension, Pt. on medications + CAD and +CHF  ECHO 2015 EF 45%   Neuro/Psych    GI/Hepatic   Endo/Other  diabetes, Poorly Controlled, Type 2  Renal/GU DialysisRenal disease     Musculoskeletal   Abdominal (+)  Abdomen: soft.    Peds  Hematology 14/42   Anesthesia Other Findings   Reproductive/Obstetrics                          Anesthesia Physical Anesthesia Plan  ASA: III  Anesthesia Plan: MAC   Post-op Pain Management:    Induction: Intravenous  Airway Management Planned: Nasal Cannula  Additional Equipment:   Intra-op Plan:   Post-operative Plan: Extubation in OR  Informed Consent: I have reviewed the patients History and Physical, chart, labs and discussed the procedure including the risks, benefits and alternatives for the proposed anesthesia with the patient or authorized representative who has indicated his/her understanding and acceptance.     Plan Discussed with:   Anesthesia Plan Comments: (Multimodal pain RX)       Anesthesia Quick Evaluation

## 2014-08-21 ENCOUNTER — Encounter (HOSPITAL_COMMUNITY)
Admission: RE | Disposition: A | Discharge status: Home / Self Care | Payer: Self-pay | Source: Ambulatory Visit | Attending: Vascular Surgery

## 2014-08-21 ENCOUNTER — Other Ambulatory Visit: Payer: Self-pay | Admitting: *Deleted

## 2014-08-21 ENCOUNTER — Ambulatory Visit (HOSPITAL_COMMUNITY): Payer: Medicare Other | Admitting: Vascular Surgery

## 2014-08-21 ENCOUNTER — Ambulatory Visit (HOSPITAL_COMMUNITY)
Admission: RE | Admit: 2014-08-21 | Discharge: 2014-08-21 | Disposition: A | Discharge status: Home / Self Care | Payer: Medicare Other | Source: Ambulatory Visit | Attending: Vascular Surgery | Admitting: Vascular Surgery

## 2014-08-21 ENCOUNTER — Encounter (HOSPITAL_COMMUNITY): Payer: Self-pay | Admitting: *Deleted

## 2014-08-21 DIAGNOSIS — E785 Hyperlipidemia, unspecified: Secondary | ICD-10-CM | POA: Diagnosis not present

## 2014-08-21 DIAGNOSIS — N186 End stage renal disease: Secondary | ICD-10-CM | POA: Diagnosis not present

## 2014-08-21 DIAGNOSIS — Z992 Dependence on renal dialysis: Secondary | ICD-10-CM | POA: Insufficient documentation

## 2014-08-21 DIAGNOSIS — I2510 Atherosclerotic heart disease of native coronary artery without angina pectoris: Secondary | ICD-10-CM | POA: Diagnosis not present

## 2014-08-21 DIAGNOSIS — Z87891 Personal history of nicotine dependence: Secondary | ICD-10-CM | POA: Insufficient documentation

## 2014-08-21 DIAGNOSIS — I120 Hypertensive chronic kidney disease with stage 5 chronic kidney disease or end stage renal disease: Secondary | ICD-10-CM | POA: Diagnosis not present

## 2014-08-21 DIAGNOSIS — E079 Disorder of thyroid, unspecified: Secondary | ICD-10-CM | POA: Insufficient documentation

## 2014-08-21 DIAGNOSIS — E1121 Type 2 diabetes mellitus with diabetic nephropathy: Secondary | ICD-10-CM | POA: Insufficient documentation

## 2014-08-21 DIAGNOSIS — Z794 Long term (current) use of insulin: Secondary | ICD-10-CM | POA: Insufficient documentation

## 2014-08-21 DIAGNOSIS — Z853 Personal history of malignant neoplasm of breast: Secondary | ICD-10-CM | POA: Diagnosis not present

## 2014-08-21 DIAGNOSIS — Z87440 Personal history of urinary (tract) infections: Secondary | ICD-10-CM | POA: Diagnosis not present

## 2014-08-21 DIAGNOSIS — Z4931 Encounter for adequacy testing for hemodialysis: Secondary | ICD-10-CM

## 2014-08-21 DIAGNOSIS — I509 Heart failure, unspecified: Secondary | ICD-10-CM | POA: Diagnosis not present

## 2014-08-21 DIAGNOSIS — N185 Chronic kidney disease, stage 5: Secondary | ICD-10-CM | POA: Diagnosis not present

## 2014-08-21 DIAGNOSIS — I238 Other current complications following acute myocardial infarction: Secondary | ICD-10-CM | POA: Diagnosis not present

## 2014-08-21 DIAGNOSIS — I252 Old myocardial infarction: Secondary | ICD-10-CM | POA: Insufficient documentation

## 2014-08-21 DIAGNOSIS — S91309A Unspecified open wound, unspecified foot, initial encounter: Secondary | ICD-10-CM | POA: Diagnosis not present

## 2014-08-21 HISTORY — DX: Unspecified osteoarthritis, unspecified site: M19.90

## 2014-08-21 HISTORY — PX: AV FISTULA PLACEMENT: SHX1204

## 2014-08-21 LAB — GLUCOSE, CAPILLARY
GLUCOSE-CAPILLARY: 98 mg/dL (ref 70–99)
Glucose-Capillary: 119 mg/dL — ABNORMAL HIGH (ref 70–99)

## 2014-08-21 LAB — POCT I-STAT 4, (NA,K, GLUC, HGB,HCT)
Glucose, Bld: 125 mg/dL — ABNORMAL HIGH (ref 70–99)
HEMATOCRIT: 45 % (ref 36.0–46.0)
Hemoglobin: 15.3 g/dL — ABNORMAL HIGH (ref 12.0–15.0)
Potassium: 3.8 mmol/L (ref 3.5–5.1)
SODIUM: 138 mmol/L (ref 135–145)

## 2014-08-21 SURGERY — ARTERIOVENOUS (AV) FISTULA CREATION
Anesthesia: Monitor Anesthesia Care | Site: Arm Lower | Laterality: Left

## 2014-08-21 MED ORDER — FENTANYL CITRATE 0.05 MG/ML IJ SOLN
INTRAMUSCULAR | Status: AC
  Filled 2014-08-21: qty 5

## 2014-08-21 MED ORDER — HEPARIN SODIUM (PORCINE) 5000 UNIT/ML IJ SOLN
INTRAMUSCULAR | Status: DC | PRN
  Administered 2014-08-21: 14:00:00

## 2014-08-21 MED ORDER — PROPOFOL 10 MG/ML IV BOLUS
INTRAVENOUS | Status: AC
  Filled 2014-08-21: qty 20

## 2014-08-21 MED ORDER — CEFAZOLIN SODIUM-DEXTROSE 2-3 GM-% IV SOLR
INTRAVENOUS | Status: DC | PRN
  Administered 2014-08-21: 2 g via INTRAVENOUS

## 2014-08-21 MED ORDER — MEPERIDINE HCL 25 MG/ML IJ SOLN: 6.2500 mg | INTRAMUSCULAR | Status: DC | PRN

## 2014-08-21 MED ORDER — MIDAZOLAM HCL 5 MG/5ML IJ SOLN
INTRAMUSCULAR | Status: DC | PRN
  Administered 2014-08-21: 2 mg via INTRAVENOUS

## 2014-08-21 MED ORDER — HEPARIN SODIUM (PORCINE) 1000 UNIT/ML IJ SOLN
INTRAMUSCULAR | Status: DC | PRN
  Administered 2014-08-21: 6000 [IU] via INTRAVENOUS

## 2014-08-21 MED ORDER — OXYCODONE HCL 5 MG PO TABS: 5.0000 mg | ORAL_TABLET | Freq: Four times a day (QID) | ORAL | Status: DC | PRN

## 2014-08-21 MED ORDER — PROPOFOL INFUSION 10 MG/ML OPTIME
INTRAVENOUS | Status: DC | PRN
  Administered 2014-08-21: 35 ug/kg/min via INTRAVENOUS

## 2014-08-21 MED ORDER — 0.9 % SODIUM CHLORIDE (POUR BTL) OPTIME
TOPICAL | Status: DC | PRN
  Administered 2014-08-21: 1000 mL

## 2014-08-21 MED ORDER — MIDAZOLAM HCL 2 MG/2ML IJ SOLN
INTRAMUSCULAR | Status: AC
  Filled 2014-08-21: qty 2

## 2014-08-21 MED ORDER — LIDOCAINE-EPINEPHRINE (PF) 1 %-1:200000 IJ SOLN
INTRAMUSCULAR | Status: DC | PRN
  Administered 2014-08-21: 30 mL

## 2014-08-21 MED ORDER — LIDOCAINE-EPINEPHRINE (PF) 1 %-1:200000 IJ SOLN
INTRAMUSCULAR | Status: AC
  Filled 2014-08-21: qty 10

## 2014-08-21 MED ORDER — PROMETHAZINE HCL 25 MG/ML IJ SOLN: 6.2500 mg | INTRAMUSCULAR | Status: DC | PRN

## 2014-08-21 MED ORDER — FENTANYL CITRATE 0.05 MG/ML IJ SOLN
INTRAMUSCULAR | Status: DC | PRN
  Administered 2014-08-21 (×2): 50 ug via INTRAVENOUS

## 2014-08-21 MED ORDER — LIDOCAINE HCL (PF) 1 % IJ SOLN
INTRAMUSCULAR | Status: AC
  Filled 2014-08-21: qty 30

## 2014-08-21 MED ORDER — PROTAMINE SULFATE 10 MG/ML IV SOLN
INTRAVENOUS | Status: DC | PRN
  Administered 2014-08-21: 30 mg via INTRAVENOUS

## 2014-08-21 MED ORDER — THROMBIN 20000 UNITS EX SOLR
CUTANEOUS | Status: AC
  Filled 2014-08-21: qty 20000

## 2014-08-21 MED ORDER — FENTANYL CITRATE 0.05 MG/ML IJ SOLN: 25.0000 ug | INTRAMUSCULAR | Status: DC | PRN

## 2014-08-21 SURGICAL SUPPLY — 32 items
ARMBAND PINK RESTRICT EXTREMIT (MISCELLANEOUS) ×3 IMPLANT
CANISTER SUCTION 2500CC (MISCELLANEOUS) ×3 IMPLANT
CANNULA VESSEL 3MM 2 BLNT TIP (CANNULA) ×3 IMPLANT
CLIP TI MEDIUM 6 (CLIP) ×3 IMPLANT
CLIP TI WIDE RED SMALL 6 (CLIP) ×6 IMPLANT
COVER PROBE W GEL 5X96 (DRAPES) ×3 IMPLANT
DECANTER SPIKE VIAL GLASS SM (MISCELLANEOUS) ×3 IMPLANT
ELECT REM PT RETURN 9FT ADLT (ELECTROSURGICAL) ×3
ELECTRODE REM PT RTRN 9FT ADLT (ELECTROSURGICAL) ×1 IMPLANT
GLOVE BIO SURGEON STRL SZ 6.5 (GLOVE) ×4 IMPLANT
GLOVE BIO SURGEON STRL SZ7.5 (GLOVE) ×3 IMPLANT
GLOVE BIO SURGEONS STRL SZ 6.5 (GLOVE) ×2
GLOVE BIOGEL PI IND STRL 7.0 (GLOVE) ×1 IMPLANT
GLOVE BIOGEL PI IND STRL 8 (GLOVE) ×2 IMPLANT
GLOVE BIOGEL PI INDICATOR 7.0 (GLOVE) ×2
GLOVE BIOGEL PI INDICATOR 8 (GLOVE) ×4
GLOVE SURG SS PI 7.0 STRL IVOR (GLOVE) ×3 IMPLANT
GOWN STRL REUS W/ TWL LRG LVL3 (GOWN DISPOSABLE) ×3 IMPLANT
GOWN STRL REUS W/TWL LRG LVL3 (GOWN DISPOSABLE) ×6
KIT BASIN OR (CUSTOM PROCEDURE TRAY) ×3 IMPLANT
KIT ROOM TURNOVER OR (KITS) ×3 IMPLANT
LIQUID BAND (GAUZE/BANDAGES/DRESSINGS) ×3 IMPLANT
NS IRRIG 1000ML POUR BTL (IV SOLUTION) ×3 IMPLANT
PACK CV ACCESS (CUSTOM PROCEDURE TRAY) ×3 IMPLANT
PAD ARMBOARD 7.5X6 YLW CONV (MISCELLANEOUS) ×6 IMPLANT
SPONGE SURGIFOAM ABS GEL 100 (HEMOSTASIS) IMPLANT
SUT PROLENE 6 0 BV (SUTURE) ×3 IMPLANT
SUT VIC AB 3-0 SH 27 (SUTURE) ×2
SUT VIC AB 3-0 SH 27X BRD (SUTURE) ×1 IMPLANT
SUT VICRYL 4-0 PS2 18IN ABS (SUTURE) ×6 IMPLANT
UNDERPAD 30X30 INCONTINENT (UNDERPADS AND DIAPERS) ×3 IMPLANT
WATER STERILE IRR 1000ML POUR (IV SOLUTION) ×3 IMPLANT

## 2014-08-21 NOTE — Anesthesia Postprocedure Evaluation (Signed)
Anesthesia Post Note  Patient: Alexis [name]  Procedure(s) Performed: Procedure(s) (LRB): LEFT ARM ARTERIOVENOUS (AV) FISTULA CREATION  (Left)  Anesthesia type: MAC  Patient location: PACU  Post pain: Pain level controlled  Post assessment: Post-op Vital signs reviewed  Last Vitals: BP 127/59 mmHg  Pulse 69  Temp(Src) 36.7 C (Oral)  Resp 14  Ht 5\' 6"  (1.676 m)  Wt 220 lb (99.791 kg)  BMI 35.53 kg/m2  SpO2 96%  Post vital signs: Reviewed  Level of consciousness: awake  Complications: No apparent anesthesia complications

## 2014-08-21 NOTE — Op Note (Signed)
    NAME: Alexis [name]   MRN: [id number] DOB: 02-Oct-1945    DATE OF OPERATION: 08/21/2014  PREOP DIAGNOSIS: stage V chronic kidney disease  POSTOP DIAGNOSIS: same  PROCEDURE: left brachial cephalic AV fistula  SURGEON: Judeth Cornfield. Scot Dock, MD, FACS  ASSIST: Leontine Locket, PA  ANESTHESIA: local with sedation   EBL: minimal  INDICATIONS: [name] is a 69 y.o. female who presents for new access.  FINDINGS: 4.5 mm left upper arm cephalic vein.  TECHNIQUE: The patient was taken to the operating room and sedated by anesthesia. The left upper extremity was prepped and draped in the usual sterile fashion. After the skin was anesthetized with 1% lidocaine, a transverse incision was made at the antecubital level and here the cephalic vein was dissected free. The vein was lateral and the artery quite medial. Therefore I needed to mobilize the vein for some distance in order to allow adequate length to reach the brachial artery. Therefore, after the skin was anesthetized, a longitudinal incision was made over the cephalic vein in the upper forearm and more of the vein was harvested. The patient was then heparinized. The brachial artery was dissected free beneath the fascia. The artery was clamped proximally and distally and a longitudinal arteriotomy was made. The vein was sewn end-to-side to the artery using continuous 6-0 Prolene suture. At the completion was an excellent thrill in the fistula and a palpable radial pulse. The heparin was partially reversed with protamine. The wound was closed with 3-0 Vicryl and the skin closed with 4-0 Vicryl. Dermabond was applied. The patient tolerated the procedure well and was transferred to the recovery room in stable condition. All needle and sponge counts were correct.  [name], MD, FACS [facility]  DATE OF DICTATION:   08/21/2014

## 2014-08-21 NOTE — Discharge Instructions (Signed)
° ° °  08/21/2014 ERIANNE LADAS [id number] 1946/01/09  Surgeon(s): Angelia Mould, MD  Procedure(s): LEFT ARM ARTERIOVENOUS (AV) FISTULA CREATION   x Do not stick fistula for 12 weeks

## 2014-08-21 NOTE — Interval H&P Note (Signed)
History and Physical Interval Note:  08/21/2014 1:21 PM  Alexis [name]  has presented today for surgery, with the diagnosis of End Stage Renal Disease  N18.6  The various methods of treatment have been discussed with the patient and family. After consideration of risks, benefits and other options for treatment, the patient has consented to  Procedure(s): LEFT ARM ARTERIOVENOUS (AV) FISTULA CREATION VERSUS INSERTION OF LEFT ARM GORTEX GRAFT (Left) as a surgical intervention .  The patient's history has been reviewed, patient examined, no change in status, stable for surgery.  I have reviewed the patient's chart and labs.  Questions were answered to the patient's satisfaction.     DICKSON,CHRISTOPHER S

## 2014-08-21 NOTE — Transfer of Care (Signed)
Immediate Anesthesia Transfer of Care Note  Patient: Alexis [name]  Procedure(s) Performed: Procedure(s): LEFT ARM ARTERIOVENOUS (AV) FISTULA CREATION  (Left)  Patient Location: PACU  Anesthesia Type:MAC  Level of Consciousness: awake, alert , oriented and patient cooperative  Airway & Oxygen Therapy: Patient Spontanous Breathing and Patient connected to face mask oxygen  Post-op Assessment: Report given to RN, Post -op Vital signs reviewed and stable, Patient moving all extremities and Patient moving all extremities X 4  Post vital signs: Reviewed and stable  Last Vitals:  Filed Vitals:   08/21/14 1010  BP: 142/83  Pulse: 69  Temp: 36.6 C  Resp: 18    Complications: No apparent anesthesia complications

## 2014-08-21 NOTE — H&P (View-Only) (Signed)
[facility]  Patient name: Alexis [name] MRN: [id number] DOB: 1946/01/03 Sex: female  REASON FOR VISIT: to evaluate for new access.  HPI: Alexis [name] is a 69 y.o. female who presented with a nonhealing liver right fifth toe. She underwent an arteriogram on 08/11/2014. On the right side, which is the site of concern, the common femoral and deep femoral artery are patent. There is mild diffuse disease throughout the right superficial femoral artery but no focal critical stenosis. The popliteal artery is patent. The anterior tibial artery on the right is occluded. The tibial peroneal trunk, peroneal, and posterior tibial arteries are patent. Based on the arteriogram I thought she had adequate circulation to heal the wound on the right toe. She comes into the office to arrange new access in her left arm for dialysis.  She dialyzes on Tuesdays Thursdays and Saturdays and has a tunneled dialysis catheter. She presented to have access done in her left arm twice at [facility] and her surgery was canceled. Apparently at one point she had to leave the hospital at 2 AM after waiting all day for surgery. For this reason she is elected to have her surgery placed here.  Her previous vein map showed that the forearm cephalic vein on the left was fairly small. The upper arm cephalic vein might be adequate for a fistula. The basilic vein was not usable.based on the vein mapping, I thought she might be a candidate for a left brachiocephalic AV fistula. If this were not possible she would require an AV graft.   Past Medical History  Diagnosis Date  . Diabetes mellitus   . Hypertension   . Morbid obesity   . Coronary artery disease   . Chest pain   . 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 Hyperlipidemia   . Hx of breast cancer   . CHF (congestive heart failure)   . SOB (shortness of breath)   . Chronic kidney disease   . Cancer 2005     left breast  . Diabetic nephropathy 01-08-13  .  Proteinuria 01-08-13  . Dyslipidemia 01-08-13  . Thyroid disease 01-08-13    Hyper-parathyroidism-secondary  . Hx: UTI (urinary tract infection) 01-08-13   Family History  Problem Relation Age of Onset  . Breast cancer Mother     Died age 75  . Cancer Mother 88    Breast  . Heart disease Mother   . Hyperlipidemia Mother   . Hypertension Mother   . Heart attack Mother    SOCIAL HISTORY: History  Substance Use Topics  . Smoking status: Former Smoker -- 1.00 packs/day for 30 years    Types: Cigarettes    Quit date: 05/16/1998  . Smokeless tobacco: Never Used  . Alcohol Use: No   No Known Allergies Current Outpatient Prescriptions  Medication Sig Dispense Refill  . carvedilol (COREG) 25 MG tablet Take 25 mg by mouth 2 (two) times daily with a meal.    . docusate sodium (COLACE) 100 MG capsule Take 100 mg by mouth daily as needed for mild constipation.     . hydrALAZINE (APRESOLINE) 100 MG tablet Take 100 mg by mouth 3 (three) times daily.    . hydrOXYzine (ATARAX/VISTARIL) 25 MG tablet Take 25 mg by mouth 2 (two) times daily as needed for itching.    . insulin aspart protamine-insulin aspart (NOVOLOG 70/30) (70-30) 100 UNIT/ML injection Inject 30-60 Units into the skin 2 (two) times daily  with a meal. 60 UNITS IN THE MORNING AND 30 UNITS AT BEDTIME    . isosorbide mononitrate (IMDUR) 30 MG 24 hr tablet Take 30 mg by mouth daily. Take 1 tablet (30 mg total) by mouth daily.    . nitroGLYCERIN (NITROSTAT) 0.4 MG SL tablet Place 0.4 mg under the tongue every 5 (five) minutes as needed for chest pain.    . sevelamer carbonate (RENVELA) 800 MG tablet Take 800 mg by mouth 3 (three) times daily with meals.      No current facility-administered medications for this visit.   REVIEW OF SYSTEMS: Arly.Keller ] denotes positive finding; [  ] denotes negative finding  CARDIOVASCULAR:  [ ]  chest pain   [ ]  chest pressure   [ ]  palpitations   [ ]  orthopnea   [ ]  dyspnea on exertion   [ ]  claudication   [ ]   rest pain   [ ]  DVT   [ ]  phlebitis PULMONARY:   [ ]  productive cough   [ ]  asthma   [ ]  wheezing NEUROLOGIC:   [ ]  weakness  [ ]  paresthesias  [ ]  aphasia  [ ]  amaurosis  [ ]  dizziness HEMATOLOGIC:   [ ]  bleeding problems   [ ]  clotting disorders MUSCULOSKELETAL:  [ ]  joint pain   [ ]  joint swelling [ ]  leg swelling GASTROINTESTINAL: [ ]   blood in stool  [ ]   hematemesis GENITOURINARY:  [ ]   dysuria  [ ]   hematuria PSYCHIATRIC:  [ ]  history of major depression INTEGUMENTARY:  [ ]  rashes  [ ]  ulcers CONSTITUTIONAL:  [ ]  fever   [ ]  chills  PHYSICAL EXAM: Filed Vitals:   08/13/14 1603  BP: 168/74  Pulse: 76  Temp: 98.8 F (37.1 C)  TempSrc: Oral  Resp: 18  Height: 5\' 6"  (1.676 m)  Weight: 220 lb (99.791 kg)  SpO2: 99%   GENERAL: The patient is a well-nourished female, in no acute distress. The vital signs are documented above. CARDIOVASCULAR: There is a regular rate and rhythm. He has a palpable radial pulse bilaterally. PULMONARY: There is good air exchange bilaterally without wheezing or rales. ABDOMEN: Soft and non-tender with normal pitched bowel sounds.  MUSCULOSKELETAL: There are no major deformities or cyanosis. NEUROLOGIC: No focal weakness or paresthesias are detected. SKIN: the wound on the right foot Has improved significantly. PSYCHIATRIC: The patient has a normal affect.  DATA:  Arteriogram and vein mapping are described above.  MEDICAL ISSUES:  END-STAGE RENAL DISEASE: I have recommended we explore her upper arm cephalic vein on the left and hopefully place a left brachiocephalic AV fistula. If this is not adequate then she would need an AV graft. I have explained the indications for placement of an AV fistula or AV graft. I've explained that if at all possible we will place an AV fistula.  I have reviewed the risks of placement of an AV fistula including but not limited to: failure of the fistula to mature, need for subsequent interventions, and thrombosis. In  addition I have reviewed the potential complications of placement of an AV graft. These risks include, but are not limited to, graft thrombosis, graft infection, wound healing problems, bleeding, arm swelling, and steal syndrome. All the patient's questions were answered and they are agreeable to proceed with surgery. Her surgery is scheduled for 08/21/2014. She normally dialyzes on Tuesdays Thursdays and Saturdays and she will change her dialysis day.    Alaiah Lundy S [facility]  of [facility] [employer]: [phone]

## 2014-08-22 ENCOUNTER — Encounter (HOSPITAL_COMMUNITY): Payer: Self-pay | Admitting: Vascular Surgery

## 2014-08-22 DIAGNOSIS — I238 Other current complications following acute myocardial infarction: Secondary | ICD-10-CM | POA: Diagnosis not present

## 2014-08-22 LAB — GLUCOSE, CAPILLARY: GLUCOSE-CAPILLARY: 102 mg/dL — AB (ref 70–99)

## 2014-08-23 DIAGNOSIS — D509 Iron deficiency anemia, unspecified: Secondary | ICD-10-CM | POA: Diagnosis not present

## 2014-08-23 DIAGNOSIS — D631 Anemia in chronic kidney disease: Secondary | ICD-10-CM | POA: Diagnosis not present

## 2014-08-23 DIAGNOSIS — N2581 Secondary hyperparathyroidism of renal origin: Secondary | ICD-10-CM | POA: Diagnosis not present

## 2014-08-23 DIAGNOSIS — N186 End stage renal disease: Secondary | ICD-10-CM | POA: Diagnosis not present

## 2014-08-23 DIAGNOSIS — I238 Other current complications following acute myocardial infarction: Secondary | ICD-10-CM | POA: Diagnosis not present

## 2014-08-23 DIAGNOSIS — Z992 Dependence on renal dialysis: Secondary | ICD-10-CM | POA: Diagnosis not present

## 2014-08-24 DIAGNOSIS — I238 Other current complications following acute myocardial infarction: Secondary | ICD-10-CM | POA: Diagnosis not present

## 2014-08-25 ENCOUNTER — Telehealth: Payer: Self-pay | Admitting: Vascular Surgery

## 2014-08-25 DIAGNOSIS — I238 Other current complications following acute myocardial infarction: Secondary | ICD-10-CM | POA: Diagnosis not present

## 2014-08-25 NOTE — Telephone Encounter (Signed)
-----   Message from Mena Goes, RN sent at 08/21/2014  3:21 PM EDT ----- Regarding: Schedule   ----- Message -----    From: Gabriel Earing, PA-C    Sent: 08/21/2014   3:15 PM      To: [facility] Charge Pool  S/p left BC AVF 08/21/14.  F/u with Dr. Scot Dock in 6 weeks with duplex.  Thanks, Aldona Bar

## 2014-08-25 NOTE — Telephone Encounter (Signed)
Left message for Alexis [name] to call DANA to confirm appt, dpm

## 2014-08-26 DIAGNOSIS — N2581 Secondary hyperparathyroidism of renal origin: Secondary | ICD-10-CM | POA: Diagnosis not present

## 2014-08-26 DIAGNOSIS — I238 Other current complications following acute myocardial infarction: Secondary | ICD-10-CM | POA: Diagnosis not present

## 2014-08-26 DIAGNOSIS — D631 Anemia in chronic kidney disease: Secondary | ICD-10-CM | POA: Diagnosis not present

## 2014-08-26 DIAGNOSIS — Z992 Dependence on renal dialysis: Secondary | ICD-10-CM | POA: Diagnosis not present

## 2014-08-26 DIAGNOSIS — D509 Iron deficiency anemia, unspecified: Secondary | ICD-10-CM | POA: Diagnosis not present

## 2014-08-26 DIAGNOSIS — N186 End stage renal disease: Secondary | ICD-10-CM | POA: Diagnosis not present

## 2014-08-27 DIAGNOSIS — N186 End stage renal disease: Secondary | ICD-10-CM | POA: Diagnosis not present

## 2014-08-27 DIAGNOSIS — Z89421 Acquired absence of other right toe(s): Secondary | ICD-10-CM | POA: Diagnosis not present

## 2014-08-27 DIAGNOSIS — Z992 Dependence on renal dialysis: Secondary | ICD-10-CM | POA: Diagnosis not present

## 2014-08-27 DIAGNOSIS — I509 Heart failure, unspecified: Secondary | ICD-10-CM | POA: Diagnosis not present

## 2014-08-27 DIAGNOSIS — Z4801 Encounter for change or removal of surgical wound dressing: Secondary | ICD-10-CM | POA: Diagnosis not present

## 2014-08-27 DIAGNOSIS — E119 Type 2 diabetes mellitus without complications: Secondary | ICD-10-CM | POA: Diagnosis not present

## 2014-08-27 DIAGNOSIS — Z4781 Encounter for orthopedic aftercare following surgical amputation: Secondary | ICD-10-CM | POA: Diagnosis not present

## 2014-08-27 DIAGNOSIS — I238 Other current complications following acute myocardial infarction: Secondary | ICD-10-CM | POA: Diagnosis not present

## 2014-08-27 DIAGNOSIS — I120 Hypertensive chronic kidney disease with stage 5 chronic kidney disease or end stage renal disease: Secondary | ICD-10-CM | POA: Diagnosis not present

## 2014-08-28 DIAGNOSIS — N186 End stage renal disease: Secondary | ICD-10-CM | POA: Diagnosis not present

## 2014-08-28 DIAGNOSIS — D509 Iron deficiency anemia, unspecified: Secondary | ICD-10-CM | POA: Diagnosis not present

## 2014-08-28 DIAGNOSIS — D631 Anemia in chronic kidney disease: Secondary | ICD-10-CM | POA: Diagnosis not present

## 2014-08-28 DIAGNOSIS — N2581 Secondary hyperparathyroidism of renal origin: Secondary | ICD-10-CM | POA: Diagnosis not present

## 2014-08-28 DIAGNOSIS — I238 Other current complications following acute myocardial infarction: Secondary | ICD-10-CM | POA: Diagnosis not present

## 2014-08-28 DIAGNOSIS — Z992 Dependence on renal dialysis: Secondary | ICD-10-CM | POA: Diagnosis not present

## 2014-08-29 DIAGNOSIS — I238 Other current complications following acute myocardial infarction: Secondary | ICD-10-CM | POA: Diagnosis not present

## 2014-08-29 DIAGNOSIS — H25813 Combined forms of age-related cataract, bilateral: Secondary | ICD-10-CM | POA: Diagnosis not present

## 2014-08-29 DIAGNOSIS — E10331 Type 1 diabetes mellitus with moderate nonproliferative diabetic retinopathy with macular edema: Secondary | ICD-10-CM | POA: Diagnosis not present

## 2014-08-30 DIAGNOSIS — N2581 Secondary hyperparathyroidism of renal origin: Secondary | ICD-10-CM | POA: Diagnosis not present

## 2014-08-30 DIAGNOSIS — I238 Other current complications following acute myocardial infarction: Secondary | ICD-10-CM | POA: Diagnosis not present

## 2014-08-30 DIAGNOSIS — N186 End stage renal disease: Secondary | ICD-10-CM | POA: Diagnosis not present

## 2014-08-30 DIAGNOSIS — Z992 Dependence on renal dialysis: Secondary | ICD-10-CM | POA: Diagnosis not present

## 2014-08-30 DIAGNOSIS — D509 Iron deficiency anemia, unspecified: Secondary | ICD-10-CM | POA: Diagnosis not present

## 2014-08-30 DIAGNOSIS — D631 Anemia in chronic kidney disease: Secondary | ICD-10-CM | POA: Diagnosis not present

## 2014-08-31 DIAGNOSIS — I238 Other current complications following acute myocardial infarction: Secondary | ICD-10-CM | POA: Diagnosis not present

## 2014-09-01 DIAGNOSIS — S98211S Complete traumatic amputation of two or more right lesser toes, sequela: Secondary | ICD-10-CM | POA: Diagnosis not present

## 2014-09-01 DIAGNOSIS — N186 End stage renal disease: Secondary | ICD-10-CM | POA: Diagnosis not present

## 2014-09-01 DIAGNOSIS — E1121 Type 2 diabetes mellitus with diabetic nephropathy: Secondary | ICD-10-CM | POA: Diagnosis not present

## 2014-09-01 DIAGNOSIS — I10 Essential (primary) hypertension: Secondary | ICD-10-CM | POA: Diagnosis not present

## 2014-09-01 DIAGNOSIS — I238 Other current complications following acute myocardial infarction: Secondary | ICD-10-CM | POA: Diagnosis not present

## 2014-09-02 DIAGNOSIS — N2581 Secondary hyperparathyroidism of renal origin: Secondary | ICD-10-CM | POA: Diagnosis not present

## 2014-09-02 DIAGNOSIS — D509 Iron deficiency anemia, unspecified: Secondary | ICD-10-CM | POA: Diagnosis not present

## 2014-09-02 DIAGNOSIS — D631 Anemia in chronic kidney disease: Secondary | ICD-10-CM | POA: Diagnosis not present

## 2014-09-02 DIAGNOSIS — Z992 Dependence on renal dialysis: Secondary | ICD-10-CM | POA: Diagnosis not present

## 2014-09-02 DIAGNOSIS — N186 End stage renal disease: Secondary | ICD-10-CM | POA: Diagnosis not present

## 2014-09-02 DIAGNOSIS — I238 Other current complications following acute myocardial infarction: Secondary | ICD-10-CM | POA: Diagnosis not present

## 2014-09-03 DIAGNOSIS — Z4781 Encounter for orthopedic aftercare following surgical amputation: Secondary | ICD-10-CM | POA: Diagnosis not present

## 2014-09-03 DIAGNOSIS — Z89421 Acquired absence of other right toe(s): Secondary | ICD-10-CM | POA: Diagnosis not present

## 2014-09-03 DIAGNOSIS — I509 Heart failure, unspecified: Secondary | ICD-10-CM | POA: Diagnosis not present

## 2014-09-03 DIAGNOSIS — E119 Type 2 diabetes mellitus without complications: Secondary | ICD-10-CM | POA: Diagnosis not present

## 2014-09-03 DIAGNOSIS — I120 Hypertensive chronic kidney disease with stage 5 chronic kidney disease or end stage renal disease: Secondary | ICD-10-CM | POA: Diagnosis not present

## 2014-09-03 DIAGNOSIS — N186 End stage renal disease: Secondary | ICD-10-CM | POA: Diagnosis not present

## 2014-09-03 DIAGNOSIS — Z992 Dependence on renal dialysis: Secondary | ICD-10-CM | POA: Diagnosis not present

## 2014-09-03 DIAGNOSIS — I238 Other current complications following acute myocardial infarction: Secondary | ICD-10-CM | POA: Diagnosis not present

## 2014-09-03 DIAGNOSIS — Z4801 Encounter for change or removal of surgical wound dressing: Secondary | ICD-10-CM | POA: Diagnosis not present

## 2014-09-04 DIAGNOSIS — D631 Anemia in chronic kidney disease: Secondary | ICD-10-CM | POA: Diagnosis not present

## 2014-09-04 DIAGNOSIS — D509 Iron deficiency anemia, unspecified: Secondary | ICD-10-CM | POA: Diagnosis not present

## 2014-09-04 DIAGNOSIS — I238 Other current complications following acute myocardial infarction: Secondary | ICD-10-CM | POA: Diagnosis not present

## 2014-09-04 DIAGNOSIS — N2581 Secondary hyperparathyroidism of renal origin: Secondary | ICD-10-CM | POA: Diagnosis not present

## 2014-09-04 DIAGNOSIS — Z992 Dependence on renal dialysis: Secondary | ICD-10-CM | POA: Diagnosis not present

## 2014-09-04 DIAGNOSIS — N186 End stage renal disease: Secondary | ICD-10-CM | POA: Diagnosis not present

## 2014-09-05 DIAGNOSIS — I238 Other current complications following acute myocardial infarction: Secondary | ICD-10-CM | POA: Diagnosis not present

## 2014-09-06 DIAGNOSIS — D509 Iron deficiency anemia, unspecified: Secondary | ICD-10-CM | POA: Diagnosis not present

## 2014-09-06 DIAGNOSIS — N186 End stage renal disease: Secondary | ICD-10-CM | POA: Diagnosis not present

## 2014-09-06 DIAGNOSIS — I238 Other current complications following acute myocardial infarction: Secondary | ICD-10-CM | POA: Diagnosis not present

## 2014-09-06 DIAGNOSIS — N2581 Secondary hyperparathyroidism of renal origin: Secondary | ICD-10-CM | POA: Diagnosis not present

## 2014-09-06 DIAGNOSIS — D631 Anemia in chronic kidney disease: Secondary | ICD-10-CM | POA: Diagnosis not present

## 2014-09-06 DIAGNOSIS — Z992 Dependence on renal dialysis: Secondary | ICD-10-CM | POA: Diagnosis not present

## 2014-09-07 DIAGNOSIS — I238 Other current complications following acute myocardial infarction: Secondary | ICD-10-CM | POA: Diagnosis not present

## 2014-09-08 DIAGNOSIS — I238 Other current complications following acute myocardial infarction: Secondary | ICD-10-CM | POA: Diagnosis not present

## 2014-09-09 DIAGNOSIS — I238 Other current complications following acute myocardial infarction: Secondary | ICD-10-CM | POA: Diagnosis not present

## 2014-09-09 DIAGNOSIS — Z992 Dependence on renal dialysis: Secondary | ICD-10-CM | POA: Diagnosis not present

## 2014-09-09 DIAGNOSIS — N2581 Secondary hyperparathyroidism of renal origin: Secondary | ICD-10-CM | POA: Diagnosis not present

## 2014-09-09 DIAGNOSIS — D509 Iron deficiency anemia, unspecified: Secondary | ICD-10-CM | POA: Diagnosis not present

## 2014-09-09 DIAGNOSIS — N186 End stage renal disease: Secondary | ICD-10-CM | POA: Diagnosis not present

## 2014-09-09 DIAGNOSIS — D631 Anemia in chronic kidney disease: Secondary | ICD-10-CM | POA: Diagnosis not present

## 2014-09-10 DIAGNOSIS — I238 Other current complications following acute myocardial infarction: Secondary | ICD-10-CM | POA: Diagnosis not present

## 2014-09-11 DIAGNOSIS — D509 Iron deficiency anemia, unspecified: Secondary | ICD-10-CM | POA: Diagnosis not present

## 2014-09-11 DIAGNOSIS — N186 End stage renal disease: Secondary | ICD-10-CM | POA: Diagnosis not present

## 2014-09-11 DIAGNOSIS — Z992 Dependence on renal dialysis: Secondary | ICD-10-CM | POA: Diagnosis not present

## 2014-09-11 DIAGNOSIS — I238 Other current complications following acute myocardial infarction: Secondary | ICD-10-CM | POA: Diagnosis not present

## 2014-09-11 DIAGNOSIS — D631 Anemia in chronic kidney disease: Secondary | ICD-10-CM | POA: Diagnosis not present

## 2014-09-11 DIAGNOSIS — N2581 Secondary hyperparathyroidism of renal origin: Secondary | ICD-10-CM | POA: Diagnosis not present

## 2014-09-12 DIAGNOSIS — I238 Other current complications following acute myocardial infarction: Secondary | ICD-10-CM | POA: Diagnosis not present

## 2014-09-13 DIAGNOSIS — N186 End stage renal disease: Secondary | ICD-10-CM | POA: Diagnosis not present

## 2014-09-13 DIAGNOSIS — Z992 Dependence on renal dialysis: Secondary | ICD-10-CM | POA: Diagnosis not present

## 2014-09-13 DIAGNOSIS — N2581 Secondary hyperparathyroidism of renal origin: Secondary | ICD-10-CM | POA: Diagnosis not present

## 2014-09-13 DIAGNOSIS — D509 Iron deficiency anemia, unspecified: Secondary | ICD-10-CM | POA: Diagnosis not present

## 2014-09-13 DIAGNOSIS — D631 Anemia in chronic kidney disease: Secondary | ICD-10-CM | POA: Diagnosis not present

## 2014-09-13 DIAGNOSIS — I238 Other current complications following acute myocardial infarction: Secondary | ICD-10-CM | POA: Diagnosis not present

## 2014-09-14 DIAGNOSIS — I238 Other current complications following acute myocardial infarction: Secondary | ICD-10-CM | POA: Diagnosis not present

## 2014-09-15 DIAGNOSIS — I238 Other current complications following acute myocardial infarction: Secondary | ICD-10-CM | POA: Diagnosis not present

## 2014-09-16 DIAGNOSIS — E784 Other hyperlipidemia: Secondary | ICD-10-CM | POA: Diagnosis not present

## 2014-09-16 DIAGNOSIS — D631 Anemia in chronic kidney disease: Secondary | ICD-10-CM | POA: Diagnosis not present

## 2014-09-16 DIAGNOSIS — E8770 Fluid overload, unspecified: Secondary | ICD-10-CM | POA: Diagnosis not present

## 2014-09-16 DIAGNOSIS — I238 Other current complications following acute myocardial infarction: Secondary | ICD-10-CM | POA: Diagnosis not present

## 2014-09-16 DIAGNOSIS — D509 Iron deficiency anemia, unspecified: Secondary | ICD-10-CM | POA: Diagnosis not present

## 2014-09-16 DIAGNOSIS — Z992 Dependence on renal dialysis: Secondary | ICD-10-CM | POA: Diagnosis not present

## 2014-09-16 DIAGNOSIS — N2581 Secondary hyperparathyroidism of renal origin: Secondary | ICD-10-CM | POA: Diagnosis not present

## 2014-09-16 DIAGNOSIS — Z1239 Encounter for other screening for malignant neoplasm of breast: Secondary | ICD-10-CM | POA: Diagnosis not present

## 2014-09-16 DIAGNOSIS — N186 End stage renal disease: Secondary | ICD-10-CM | POA: Diagnosis not present

## 2014-09-16 DIAGNOSIS — E1121 Type 2 diabetes mellitus with diabetic nephropathy: Secondary | ICD-10-CM | POA: Diagnosis not present

## 2014-09-16 DIAGNOSIS — I10 Essential (primary) hypertension: Secondary | ICD-10-CM | POA: Diagnosis not present

## 2014-09-17 ENCOUNTER — Other Ambulatory Visit: Payer: Self-pay

## 2014-09-17 DIAGNOSIS — I238 Other current complications following acute myocardial infarction: Secondary | ICD-10-CM | POA: Diagnosis not present

## 2014-09-18 DIAGNOSIS — D509 Iron deficiency anemia, unspecified: Secondary | ICD-10-CM | POA: Diagnosis not present

## 2014-09-18 DIAGNOSIS — N2581 Secondary hyperparathyroidism of renal origin: Secondary | ICD-10-CM | POA: Diagnosis not present

## 2014-09-18 DIAGNOSIS — E8770 Fluid overload, unspecified: Secondary | ICD-10-CM | POA: Diagnosis not present

## 2014-09-18 DIAGNOSIS — Z992 Dependence on renal dialysis: Secondary | ICD-10-CM | POA: Diagnosis not present

## 2014-09-18 DIAGNOSIS — N186 End stage renal disease: Secondary | ICD-10-CM | POA: Diagnosis not present

## 2014-09-18 DIAGNOSIS — I238 Other current complications following acute myocardial infarction: Secondary | ICD-10-CM | POA: Diagnosis not present

## 2014-09-18 DIAGNOSIS — D631 Anemia in chronic kidney disease: Secondary | ICD-10-CM | POA: Diagnosis not present

## 2014-09-19 DIAGNOSIS — I238 Other current complications following acute myocardial infarction: Secondary | ICD-10-CM | POA: Diagnosis not present

## 2014-09-20 DIAGNOSIS — E8770 Fluid overload, unspecified: Secondary | ICD-10-CM | POA: Diagnosis not present

## 2014-09-20 DIAGNOSIS — I238 Other current complications following acute myocardial infarction: Secondary | ICD-10-CM | POA: Diagnosis not present

## 2014-09-20 DIAGNOSIS — N186 End stage renal disease: Secondary | ICD-10-CM | POA: Diagnosis not present

## 2014-09-20 DIAGNOSIS — Z992 Dependence on renal dialysis: Secondary | ICD-10-CM | POA: Diagnosis not present

## 2014-09-20 DIAGNOSIS — D631 Anemia in chronic kidney disease: Secondary | ICD-10-CM | POA: Diagnosis not present

## 2014-09-20 DIAGNOSIS — N2581 Secondary hyperparathyroidism of renal origin: Secondary | ICD-10-CM | POA: Diagnosis not present

## 2014-09-20 DIAGNOSIS — D509 Iron deficiency anemia, unspecified: Secondary | ICD-10-CM | POA: Diagnosis not present

## 2014-09-21 DIAGNOSIS — I238 Other current complications following acute myocardial infarction: Secondary | ICD-10-CM | POA: Diagnosis not present

## 2014-09-22 DIAGNOSIS — I238 Other current complications following acute myocardial infarction: Secondary | ICD-10-CM | POA: Diagnosis not present

## 2014-09-23 DIAGNOSIS — D631 Anemia in chronic kidney disease: Secondary | ICD-10-CM | POA: Diagnosis not present

## 2014-09-23 DIAGNOSIS — N186 End stage renal disease: Secondary | ICD-10-CM | POA: Diagnosis not present

## 2014-09-23 DIAGNOSIS — E8770 Fluid overload, unspecified: Secondary | ICD-10-CM | POA: Diagnosis not present

## 2014-09-23 DIAGNOSIS — D509 Iron deficiency anemia, unspecified: Secondary | ICD-10-CM | POA: Diagnosis not present

## 2014-09-23 DIAGNOSIS — N2581 Secondary hyperparathyroidism of renal origin: Secondary | ICD-10-CM | POA: Diagnosis not present

## 2014-09-23 DIAGNOSIS — I238 Other current complications following acute myocardial infarction: Secondary | ICD-10-CM | POA: Diagnosis not present

## 2014-09-23 DIAGNOSIS — Z992 Dependence on renal dialysis: Secondary | ICD-10-CM | POA: Diagnosis not present

## 2014-09-24 DIAGNOSIS — N186 End stage renal disease: Secondary | ICD-10-CM | POA: Diagnosis not present

## 2014-09-24 DIAGNOSIS — D631 Anemia in chronic kidney disease: Secondary | ICD-10-CM | POA: Diagnosis not present

## 2014-09-24 DIAGNOSIS — D509 Iron deficiency anemia, unspecified: Secondary | ICD-10-CM | POA: Diagnosis not present

## 2014-09-24 DIAGNOSIS — Z992 Dependence on renal dialysis: Secondary | ICD-10-CM | POA: Diagnosis not present

## 2014-09-24 DIAGNOSIS — E8770 Fluid overload, unspecified: Secondary | ICD-10-CM | POA: Diagnosis not present

## 2014-09-24 DIAGNOSIS — I238 Other current complications following acute myocardial infarction: Secondary | ICD-10-CM | POA: Diagnosis not present

## 2014-09-24 DIAGNOSIS — N2581 Secondary hyperparathyroidism of renal origin: Secondary | ICD-10-CM | POA: Diagnosis not present

## 2014-09-25 DIAGNOSIS — Z992 Dependence on renal dialysis: Secondary | ICD-10-CM | POA: Diagnosis not present

## 2014-09-25 DIAGNOSIS — N2581 Secondary hyperparathyroidism of renal origin: Secondary | ICD-10-CM | POA: Diagnosis not present

## 2014-09-25 DIAGNOSIS — I238 Other current complications following acute myocardial infarction: Secondary | ICD-10-CM | POA: Diagnosis not present

## 2014-09-25 DIAGNOSIS — N186 End stage renal disease: Secondary | ICD-10-CM | POA: Diagnosis not present

## 2014-09-25 DIAGNOSIS — E8770 Fluid overload, unspecified: Secondary | ICD-10-CM | POA: Diagnosis not present

## 2014-09-25 DIAGNOSIS — D631 Anemia in chronic kidney disease: Secondary | ICD-10-CM | POA: Diagnosis not present

## 2014-09-25 DIAGNOSIS — D509 Iron deficiency anemia, unspecified: Secondary | ICD-10-CM | POA: Diagnosis not present

## 2014-09-26 DIAGNOSIS — I238 Other current complications following acute myocardial infarction: Secondary | ICD-10-CM | POA: Diagnosis not present

## 2014-09-27 DIAGNOSIS — I238 Other current complications following acute myocardial infarction: Secondary | ICD-10-CM | POA: Diagnosis not present

## 2014-09-27 DIAGNOSIS — D509 Iron deficiency anemia, unspecified: Secondary | ICD-10-CM | POA: Diagnosis not present

## 2014-09-27 DIAGNOSIS — N186 End stage renal disease: Secondary | ICD-10-CM | POA: Diagnosis not present

## 2014-09-27 DIAGNOSIS — E8770 Fluid overload, unspecified: Secondary | ICD-10-CM | POA: Diagnosis not present

## 2014-09-27 DIAGNOSIS — D631 Anemia in chronic kidney disease: Secondary | ICD-10-CM | POA: Diagnosis not present

## 2014-09-27 DIAGNOSIS — N2581 Secondary hyperparathyroidism of renal origin: Secondary | ICD-10-CM | POA: Diagnosis not present

## 2014-09-27 DIAGNOSIS — Z992 Dependence on renal dialysis: Secondary | ICD-10-CM | POA: Diagnosis not present

## 2014-09-28 DIAGNOSIS — I238 Other current complications following acute myocardial infarction: Secondary | ICD-10-CM | POA: Diagnosis not present

## 2014-09-29 DIAGNOSIS — I238 Other current complications following acute myocardial infarction: Secondary | ICD-10-CM | POA: Diagnosis not present

## 2014-09-30 DIAGNOSIS — D631 Anemia in chronic kidney disease: Secondary | ICD-10-CM | POA: Diagnosis not present

## 2014-09-30 DIAGNOSIS — D509 Iron deficiency anemia, unspecified: Secondary | ICD-10-CM | POA: Diagnosis not present

## 2014-09-30 DIAGNOSIS — E8770 Fluid overload, unspecified: Secondary | ICD-10-CM | POA: Diagnosis not present

## 2014-09-30 DIAGNOSIS — Z992 Dependence on renal dialysis: Secondary | ICD-10-CM | POA: Diagnosis not present

## 2014-09-30 DIAGNOSIS — N186 End stage renal disease: Secondary | ICD-10-CM | POA: Diagnosis not present

## 2014-09-30 DIAGNOSIS — I238 Other current complications following acute myocardial infarction: Secondary | ICD-10-CM | POA: Diagnosis not present

## 2014-09-30 DIAGNOSIS — N2581 Secondary hyperparathyroidism of renal origin: Secondary | ICD-10-CM | POA: Diagnosis not present

## 2014-10-01 DIAGNOSIS — I238 Other current complications following acute myocardial infarction: Secondary | ICD-10-CM | POA: Diagnosis not present

## 2014-10-02 DIAGNOSIS — D631 Anemia in chronic kidney disease: Secondary | ICD-10-CM | POA: Diagnosis not present

## 2014-10-02 DIAGNOSIS — N186 End stage renal disease: Secondary | ICD-10-CM | POA: Diagnosis not present

## 2014-10-02 DIAGNOSIS — E8770 Fluid overload, unspecified: Secondary | ICD-10-CM | POA: Diagnosis not present

## 2014-10-02 DIAGNOSIS — I238 Other current complications following acute myocardial infarction: Secondary | ICD-10-CM | POA: Diagnosis not present

## 2014-10-02 DIAGNOSIS — D509 Iron deficiency anemia, unspecified: Secondary | ICD-10-CM | POA: Diagnosis not present

## 2014-10-02 DIAGNOSIS — Z992 Dependence on renal dialysis: Secondary | ICD-10-CM | POA: Diagnosis not present

## 2014-10-02 DIAGNOSIS — N2581 Secondary hyperparathyroidism of renal origin: Secondary | ICD-10-CM | POA: Diagnosis not present

## 2014-10-03 DIAGNOSIS — I238 Other current complications following acute myocardial infarction: Secondary | ICD-10-CM | POA: Diagnosis not present

## 2014-10-04 DIAGNOSIS — Z992 Dependence on renal dialysis: Secondary | ICD-10-CM | POA: Diagnosis not present

## 2014-10-04 DIAGNOSIS — N2581 Secondary hyperparathyroidism of renal origin: Secondary | ICD-10-CM | POA: Diagnosis not present

## 2014-10-04 DIAGNOSIS — D509 Iron deficiency anemia, unspecified: Secondary | ICD-10-CM | POA: Diagnosis not present

## 2014-10-04 DIAGNOSIS — N186 End stage renal disease: Secondary | ICD-10-CM | POA: Diagnosis not present

## 2014-10-04 DIAGNOSIS — D631 Anemia in chronic kidney disease: Secondary | ICD-10-CM | POA: Diagnosis not present

## 2014-10-04 DIAGNOSIS — I238 Other current complications following acute myocardial infarction: Secondary | ICD-10-CM | POA: Diagnosis not present

## 2014-10-04 DIAGNOSIS — E8770 Fluid overload, unspecified: Secondary | ICD-10-CM | POA: Diagnosis not present

## 2014-10-05 DIAGNOSIS — I238 Other current complications following acute myocardial infarction: Secondary | ICD-10-CM | POA: Diagnosis not present

## 2014-10-06 ENCOUNTER — Encounter: Payer: Self-pay | Admitting: Vascular Surgery

## 2014-10-06 DIAGNOSIS — I739 Peripheral vascular disease, unspecified: Secondary | ICD-10-CM | POA: Diagnosis not present

## 2014-10-06 DIAGNOSIS — I238 Other current complications following acute myocardial infarction: Secondary | ICD-10-CM | POA: Diagnosis not present

## 2014-10-06 DIAGNOSIS — E11621 Type 2 diabetes mellitus with foot ulcer: Secondary | ICD-10-CM | POA: Diagnosis not present

## 2014-10-07 ENCOUNTER — Encounter: Payer: Self-pay | Admitting: Vascular Surgery

## 2014-10-07 DIAGNOSIS — L84 Corns and callosities: Secondary | ICD-10-CM | POA: Diagnosis not present

## 2014-10-07 DIAGNOSIS — Z992 Dependence on renal dialysis: Secondary | ICD-10-CM | POA: Diagnosis not present

## 2014-10-07 DIAGNOSIS — E1142 Type 2 diabetes mellitus with diabetic polyneuropathy: Secondary | ICD-10-CM | POA: Diagnosis not present

## 2014-10-07 DIAGNOSIS — D631 Anemia in chronic kidney disease: Secondary | ICD-10-CM | POA: Diagnosis not present

## 2014-10-07 DIAGNOSIS — N186 End stage renal disease: Secondary | ICD-10-CM | POA: Diagnosis not present

## 2014-10-07 DIAGNOSIS — B351 Tinea unguium: Secondary | ICD-10-CM | POA: Diagnosis not present

## 2014-10-07 DIAGNOSIS — N2581 Secondary hyperparathyroidism of renal origin: Secondary | ICD-10-CM | POA: Diagnosis not present

## 2014-10-07 DIAGNOSIS — E8770 Fluid overload, unspecified: Secondary | ICD-10-CM | POA: Diagnosis not present

## 2014-10-07 DIAGNOSIS — I238 Other current complications following acute myocardial infarction: Secondary | ICD-10-CM | POA: Diagnosis not present

## 2014-10-07 DIAGNOSIS — D509 Iron deficiency anemia, unspecified: Secondary | ICD-10-CM | POA: Diagnosis not present

## 2014-10-08 ENCOUNTER — Ambulatory Visit (INDEPENDENT_AMBULATORY_CARE_PROVIDER_SITE_OTHER): Payer: Self-pay | Admitting: Vascular Surgery

## 2014-10-08 ENCOUNTER — Ambulatory Visit (HOSPITAL_COMMUNITY)
Admission: RE | Admit: 2014-10-08 | Discharge: 2014-10-08 | Disposition: A | Discharge status: Home / Self Care | Payer: Medicare Other | Source: Ambulatory Visit | Attending: Vascular Surgery | Admitting: Vascular Surgery

## 2014-10-08 ENCOUNTER — Encounter: Payer: Self-pay | Admitting: Vascular Surgery

## 2014-10-08 VITALS — BP 134/53 | HR 68 | Ht 66.0 in | Wt 238.0 lb

## 2014-10-08 DIAGNOSIS — N184 Chronic kidney disease, stage 4 (severe): Secondary | ICD-10-CM

## 2014-10-08 DIAGNOSIS — N186 End stage renal disease: Secondary | ICD-10-CM

## 2014-10-08 DIAGNOSIS — Z955 Presence of coronary angioplasty implant and graft: Secondary | ICD-10-CM | POA: Diagnosis not present

## 2014-10-08 DIAGNOSIS — Z4931 Encounter for adequacy testing for hemodialysis: Secondary | ICD-10-CM | POA: Diagnosis not present

## 2014-10-08 DIAGNOSIS — I2510 Atherosclerotic heart disease of native coronary artery without angina pectoris: Secondary | ICD-10-CM | POA: Diagnosis not present

## 2014-10-08 DIAGNOSIS — Z01818 Encounter for other preprocedural examination: Secondary | ICD-10-CM | POA: Diagnosis not present

## 2014-10-08 DIAGNOSIS — E1122 Type 2 diabetes mellitus with diabetic chronic kidney disease: Secondary | ICD-10-CM | POA: Diagnosis not present

## 2014-10-08 DIAGNOSIS — Z9012 Acquired absence of left breast and nipple: Secondary | ICD-10-CM | POA: Diagnosis not present

## 2014-10-08 DIAGNOSIS — I238 Other current complications following acute myocardial infarction: Secondary | ICD-10-CM | POA: Diagnosis not present

## 2014-10-08 DIAGNOSIS — I120 Hypertensive chronic kidney disease with stage 5 chronic kidney disease or end stage renal disease: Secondary | ICD-10-CM | POA: Diagnosis not present

## 2014-10-08 DIAGNOSIS — Z992 Dependence on renal dialysis: Secondary | ICD-10-CM | POA: Diagnosis not present

## 2014-10-08 NOTE — Progress Notes (Signed)
Patient name: Alexis [name] MRN: [id number] DOB: August 09, 1945 Sex: female  REASON FOR VISIT: follow up after left brachiocephalic AV fistula.  HPI: Alexis [name] is a 69 y.o. female who underwent placement of a left brachiocephalic AV fistula on 0000000. She presents for a 6 week follow up visit. The patient currently dialyzes with a catheter. She went to [facility] today for transplant evaluation. She has no complaints referrable to the left arm.  Current Outpatient Prescriptions  Medication Sig Dispense Refill  . carvedilol (COREG) 25 MG tablet Take 25 mg by mouth 2 (two) times daily with a meal.    . docusate sodium (COLACE) 100 MG capsule Take 100 mg by mouth daily as needed for mild constipation.     . hydrALAZINE (APRESOLINE) 100 MG tablet Take 100 mg by mouth 3 (three) times daily.    . hydrOXYzine (ATARAX/VISTARIL) 25 MG tablet Take 25 mg by mouth 2 (two) times daily as needed for itching.    . insulin aspart protamine-insulin aspart (NOVOLOG 70/30) (70-30) 100 UNIT/ML injection Inject 30-60 Units into the skin 2 (two) times daily with a meal. 60 UNITS IN THE MORNING AND 30 UNITS AT BEDTIME    . nitroGLYCERIN (NITROSTAT) 0.4 MG SL tablet Place 0.4 mg under the tongue every 5 (five) minutes as needed for chest pain.    Marland Kitchen oxyCODONE (ROXICODONE) 5 MG immediate release tablet Take 1 tablet (5 mg total) by mouth every 6 (six) hours as needed. 20 tablet 0  . sevelamer carbonate (RENVELA) 800 MG tablet Take 800 mg by mouth 3 (three) times daily with meals.     . isosorbide mononitrate (IMDUR) 30 MG 24 hr tablet Take 30 mg by mouth daily. Take 1 tablet (30 mg total) by mouth daily.     No current facility-administered medications for this visit.   REVIEW OF SYSTEMS: Valu.Nieves ] denotes positive finding; [  ] denotes negative finding  CARDIOVASCULAR:  [ ]  chest pain   [ ]  dyspnea on exertion    CONSTITUTIONAL:  [ ]  fever   [ ]  chills  PHYSICAL EXAM: Filed Vitals:   10/08/14 1511  BP:  134/53  Pulse: 68  Height: 5\' 6"  (1.676 m)  Weight: 238 lb (107.956 kg)  SpO2: 93%   GENERAL: The patient is a well-nourished female, in no acute distress. The vital signs are documented above. CARDIOVASCULAR: There is a regular rate and rhythm. PULMONARY: There is good air exchange bilaterally without wheezing or rales. Her left brachiocephalic fistula has an excellent thrill. She has a palpable left radial pulse.  DUPLEX LEFT AV FISTULA: Diameters of the fistula range from 0.73-0.93 cm. No areas of significant stenosis noted.  MEDICAL ISSUES:  STAGE V CHRONIC KIDNEY DISEASE: Her left brachiocephalic fistula appears to be maturing adequately. I think it should be ready for cannulation in early July. I will see her back as needed.  PERIPHERAL VASCULAR DISEASE: the patient had undergone a previous arteriogram because of a nonhealing wound of the right foot. On the right side there was only mild diffuse disease throughout the superficial femoral artery but no critical stenosis. The popliteal artery was patent. There was two-vessel runoff on the right via the peroneal artery and posterior tibial arteries. She tells me that the wound on the right foot has healed and she is doing well from that standpoint. Her arteriogram that was done in March of this year showed that on the left side she had a superficial femoral artery occlusion  with reconstitution of the below knee popliteal artery and two-vessel runoff via the posterior tibial artery and peroneal arteries. However I have explained that we would not consider revascularization on the left she developed a nonhealing wound or progressive ischemia.  [name] [facility] [place]: [phone]

## 2014-10-09 DIAGNOSIS — E8770 Fluid overload, unspecified: Secondary | ICD-10-CM | POA: Diagnosis not present

## 2014-10-09 DIAGNOSIS — N2581 Secondary hyperparathyroidism of renal origin: Secondary | ICD-10-CM | POA: Diagnosis not present

## 2014-10-09 DIAGNOSIS — I238 Other current complications following acute myocardial infarction: Secondary | ICD-10-CM | POA: Diagnosis not present

## 2014-10-09 DIAGNOSIS — N186 End stage renal disease: Secondary | ICD-10-CM | POA: Diagnosis not present

## 2014-10-09 DIAGNOSIS — D509 Iron deficiency anemia, unspecified: Secondary | ICD-10-CM | POA: Diagnosis not present

## 2014-10-09 DIAGNOSIS — Z992 Dependence on renal dialysis: Secondary | ICD-10-CM | POA: Diagnosis not present

## 2014-10-09 DIAGNOSIS — D631 Anemia in chronic kidney disease: Secondary | ICD-10-CM | POA: Diagnosis not present

## 2014-10-10 DIAGNOSIS — I238 Other current complications following acute myocardial infarction: Secondary | ICD-10-CM | POA: Diagnosis not present

## 2014-10-11 DIAGNOSIS — I238 Other current complications following acute myocardial infarction: Secondary | ICD-10-CM | POA: Diagnosis not present

## 2014-10-11 DIAGNOSIS — D631 Anemia in chronic kidney disease: Secondary | ICD-10-CM | POA: Diagnosis not present

## 2014-10-11 DIAGNOSIS — N186 End stage renal disease: Secondary | ICD-10-CM | POA: Diagnosis not present

## 2014-10-11 DIAGNOSIS — Z992 Dependence on renal dialysis: Secondary | ICD-10-CM | POA: Diagnosis not present

## 2014-10-11 DIAGNOSIS — N2581 Secondary hyperparathyroidism of renal origin: Secondary | ICD-10-CM | POA: Diagnosis not present

## 2014-10-11 DIAGNOSIS — D509 Iron deficiency anemia, unspecified: Secondary | ICD-10-CM | POA: Diagnosis not present

## 2014-10-11 DIAGNOSIS — E8770 Fluid overload, unspecified: Secondary | ICD-10-CM | POA: Diagnosis not present

## 2014-10-12 DIAGNOSIS — I238 Other current complications following acute myocardial infarction: Secondary | ICD-10-CM | POA: Diagnosis not present

## 2014-10-13 DIAGNOSIS — I238 Other current complications following acute myocardial infarction: Secondary | ICD-10-CM | POA: Diagnosis not present

## 2014-10-14 DIAGNOSIS — N186 End stage renal disease: Secondary | ICD-10-CM | POA: Diagnosis not present

## 2014-10-14 DIAGNOSIS — N2581 Secondary hyperparathyroidism of renal origin: Secondary | ICD-10-CM | POA: Diagnosis not present

## 2014-10-14 DIAGNOSIS — D509 Iron deficiency anemia, unspecified: Secondary | ICD-10-CM | POA: Diagnosis not present

## 2014-10-14 DIAGNOSIS — E8770 Fluid overload, unspecified: Secondary | ICD-10-CM | POA: Diagnosis not present

## 2014-10-14 DIAGNOSIS — D631 Anemia in chronic kidney disease: Secondary | ICD-10-CM | POA: Diagnosis not present

## 2014-10-14 DIAGNOSIS — I238 Other current complications following acute myocardial infarction: Secondary | ICD-10-CM | POA: Diagnosis not present

## 2014-10-14 DIAGNOSIS — Z992 Dependence on renal dialysis: Secondary | ICD-10-CM | POA: Diagnosis not present

## 2014-10-15 DIAGNOSIS — I238 Other current complications following acute myocardial infarction: Secondary | ICD-10-CM | POA: Diagnosis not present

## 2014-10-16 DIAGNOSIS — Z992 Dependence on renal dialysis: Secondary | ICD-10-CM | POA: Diagnosis not present

## 2014-10-16 DIAGNOSIS — D509 Iron deficiency anemia, unspecified: Secondary | ICD-10-CM | POA: Diagnosis not present

## 2014-10-16 DIAGNOSIS — N2581 Secondary hyperparathyroidism of renal origin: Secondary | ICD-10-CM | POA: Diagnosis not present

## 2014-10-16 DIAGNOSIS — N186 End stage renal disease: Secondary | ICD-10-CM | POA: Diagnosis not present

## 2014-10-16 DIAGNOSIS — I238 Other current complications following acute myocardial infarction: Secondary | ICD-10-CM | POA: Diagnosis not present

## 2014-10-16 DIAGNOSIS — Z23 Encounter for immunization: Secondary | ICD-10-CM | POA: Diagnosis not present

## 2014-10-16 DIAGNOSIS — D631 Anemia in chronic kidney disease: Secondary | ICD-10-CM | POA: Diagnosis not present

## 2014-10-17 DIAGNOSIS — I238 Other current complications following acute myocardial infarction: Secondary | ICD-10-CM | POA: Diagnosis not present

## 2014-10-18 DIAGNOSIS — Z23 Encounter for immunization: Secondary | ICD-10-CM | POA: Diagnosis not present

## 2014-10-18 DIAGNOSIS — I238 Other current complications following acute myocardial infarction: Secondary | ICD-10-CM | POA: Diagnosis not present

## 2014-10-18 DIAGNOSIS — N186 End stage renal disease: Secondary | ICD-10-CM | POA: Diagnosis not present

## 2014-10-18 DIAGNOSIS — D631 Anemia in chronic kidney disease: Secondary | ICD-10-CM | POA: Diagnosis not present

## 2014-10-18 DIAGNOSIS — D509 Iron deficiency anemia, unspecified: Secondary | ICD-10-CM | POA: Diagnosis not present

## 2014-10-18 DIAGNOSIS — N2581 Secondary hyperparathyroidism of renal origin: Secondary | ICD-10-CM | POA: Diagnosis not present

## 2014-10-18 DIAGNOSIS — Z992 Dependence on renal dialysis: Secondary | ICD-10-CM | POA: Diagnosis not present

## 2014-10-19 DIAGNOSIS — I238 Other current complications following acute myocardial infarction: Secondary | ICD-10-CM | POA: Diagnosis not present

## 2014-10-20 DIAGNOSIS — I238 Other current complications following acute myocardial infarction: Secondary | ICD-10-CM | POA: Diagnosis not present

## 2014-10-21 DIAGNOSIS — N2581 Secondary hyperparathyroidism of renal origin: Secondary | ICD-10-CM | POA: Diagnosis not present

## 2014-10-21 DIAGNOSIS — D509 Iron deficiency anemia, unspecified: Secondary | ICD-10-CM | POA: Diagnosis not present

## 2014-10-21 DIAGNOSIS — I238 Other current complications following acute myocardial infarction: Secondary | ICD-10-CM | POA: Diagnosis not present

## 2014-10-21 DIAGNOSIS — Z23 Encounter for immunization: Secondary | ICD-10-CM | POA: Diagnosis not present

## 2014-10-21 DIAGNOSIS — Z992 Dependence on renal dialysis: Secondary | ICD-10-CM | POA: Diagnosis not present

## 2014-10-21 DIAGNOSIS — D631 Anemia in chronic kidney disease: Secondary | ICD-10-CM | POA: Diagnosis not present

## 2014-10-21 DIAGNOSIS — N186 End stage renal disease: Secondary | ICD-10-CM | POA: Diagnosis not present

## 2014-10-22 DIAGNOSIS — Z9012 Acquired absence of left breast and nipple: Secondary | ICD-10-CM | POA: Diagnosis not present

## 2014-10-22 DIAGNOSIS — I238 Other current complications following acute myocardial infarction: Secondary | ICD-10-CM | POA: Diagnosis not present

## 2014-10-22 DIAGNOSIS — Z853 Personal history of malignant neoplasm of breast: Secondary | ICD-10-CM | POA: Diagnosis not present

## 2014-10-22 DIAGNOSIS — R234 Changes in skin texture: Secondary | ICD-10-CM | POA: Diagnosis not present

## 2014-10-22 DIAGNOSIS — R928 Other abnormal and inconclusive findings on diagnostic imaging of breast: Secondary | ICD-10-CM | POA: Diagnosis not present

## 2014-10-23 DIAGNOSIS — N186 End stage renal disease: Secondary | ICD-10-CM | POA: Diagnosis not present

## 2014-10-23 DIAGNOSIS — N2581 Secondary hyperparathyroidism of renal origin: Secondary | ICD-10-CM | POA: Diagnosis not present

## 2014-10-23 DIAGNOSIS — I238 Other current complications following acute myocardial infarction: Secondary | ICD-10-CM | POA: Diagnosis not present

## 2014-10-23 DIAGNOSIS — D631 Anemia in chronic kidney disease: Secondary | ICD-10-CM | POA: Diagnosis not present

## 2014-10-23 DIAGNOSIS — D509 Iron deficiency anemia, unspecified: Secondary | ICD-10-CM | POA: Diagnosis not present

## 2014-10-23 DIAGNOSIS — Z992 Dependence on renal dialysis: Secondary | ICD-10-CM | POA: Diagnosis not present

## 2014-10-23 DIAGNOSIS — Z23 Encounter for immunization: Secondary | ICD-10-CM | POA: Diagnosis not present

## 2014-10-24 DIAGNOSIS — I238 Other current complications following acute myocardial infarction: Secondary | ICD-10-CM | POA: Diagnosis not present

## 2014-10-25 DIAGNOSIS — Z23 Encounter for immunization: Secondary | ICD-10-CM | POA: Diagnosis not present

## 2014-10-25 DIAGNOSIS — D631 Anemia in chronic kidney disease: Secondary | ICD-10-CM | POA: Diagnosis not present

## 2014-10-25 DIAGNOSIS — N2581 Secondary hyperparathyroidism of renal origin: Secondary | ICD-10-CM | POA: Diagnosis not present

## 2014-10-25 DIAGNOSIS — I238 Other current complications following acute myocardial infarction: Secondary | ICD-10-CM | POA: Diagnosis not present

## 2014-10-25 DIAGNOSIS — D509 Iron deficiency anemia, unspecified: Secondary | ICD-10-CM | POA: Diagnosis not present

## 2014-10-25 DIAGNOSIS — N186 End stage renal disease: Secondary | ICD-10-CM | POA: Diagnosis not present

## 2014-10-25 DIAGNOSIS — Z992 Dependence on renal dialysis: Secondary | ICD-10-CM | POA: Diagnosis not present

## 2014-10-26 DIAGNOSIS — I238 Other current complications following acute myocardial infarction: Secondary | ICD-10-CM | POA: Diagnosis not present

## 2014-10-27 ENCOUNTER — Encounter: Payer: Self-pay | Admitting: Vascular Surgery

## 2014-10-27 DIAGNOSIS — I238 Other current complications following acute myocardial infarction: Secondary | ICD-10-CM | POA: Diagnosis not present

## 2014-10-28 DIAGNOSIS — D509 Iron deficiency anemia, unspecified: Secondary | ICD-10-CM | POA: Diagnosis not present

## 2014-10-28 DIAGNOSIS — Z992 Dependence on renal dialysis: Secondary | ICD-10-CM | POA: Diagnosis not present

## 2014-10-28 DIAGNOSIS — N186 End stage renal disease: Secondary | ICD-10-CM | POA: Diagnosis not present

## 2014-10-28 DIAGNOSIS — D631 Anemia in chronic kidney disease: Secondary | ICD-10-CM | POA: Diagnosis not present

## 2014-10-28 DIAGNOSIS — Z23 Encounter for immunization: Secondary | ICD-10-CM | POA: Diagnosis not present

## 2014-10-28 DIAGNOSIS — N2581 Secondary hyperparathyroidism of renal origin: Secondary | ICD-10-CM | POA: Diagnosis not present

## 2014-10-28 DIAGNOSIS — I238 Other current complications following acute myocardial infarction: Secondary | ICD-10-CM | POA: Diagnosis not present

## 2014-10-29 DIAGNOSIS — I238 Other current complications following acute myocardial infarction: Secondary | ICD-10-CM | POA: Diagnosis not present

## 2014-10-30 ENCOUNTER — Encounter: Payer: Medicare Other | Admitting: Vascular Surgery

## 2014-10-30 DIAGNOSIS — N2581 Secondary hyperparathyroidism of renal origin: Secondary | ICD-10-CM | POA: Diagnosis not present

## 2014-10-30 DIAGNOSIS — D509 Iron deficiency anemia, unspecified: Secondary | ICD-10-CM | POA: Diagnosis not present

## 2014-10-30 DIAGNOSIS — D631 Anemia in chronic kidney disease: Secondary | ICD-10-CM | POA: Diagnosis not present

## 2014-10-30 DIAGNOSIS — I238 Other current complications following acute myocardial infarction: Secondary | ICD-10-CM | POA: Diagnosis not present

## 2014-10-30 DIAGNOSIS — Z992 Dependence on renal dialysis: Secondary | ICD-10-CM | POA: Diagnosis not present

## 2014-10-30 DIAGNOSIS — Z23 Encounter for immunization: Secondary | ICD-10-CM | POA: Diagnosis not present

## 2014-10-30 DIAGNOSIS — N186 End stage renal disease: Secondary | ICD-10-CM | POA: Diagnosis not present

## 2014-10-31 DIAGNOSIS — I238 Other current complications following acute myocardial infarction: Secondary | ICD-10-CM | POA: Diagnosis not present

## 2014-11-01 DIAGNOSIS — N186 End stage renal disease: Secondary | ICD-10-CM | POA: Diagnosis not present

## 2014-11-01 DIAGNOSIS — N2581 Secondary hyperparathyroidism of renal origin: Secondary | ICD-10-CM | POA: Diagnosis not present

## 2014-11-01 DIAGNOSIS — Z992 Dependence on renal dialysis: Secondary | ICD-10-CM | POA: Diagnosis not present

## 2014-11-01 DIAGNOSIS — I238 Other current complications following acute myocardial infarction: Secondary | ICD-10-CM | POA: Diagnosis not present

## 2014-11-01 DIAGNOSIS — D631 Anemia in chronic kidney disease: Secondary | ICD-10-CM | POA: Diagnosis not present

## 2014-11-01 DIAGNOSIS — Z23 Encounter for immunization: Secondary | ICD-10-CM | POA: Diagnosis not present

## 2014-11-01 DIAGNOSIS — D509 Iron deficiency anemia, unspecified: Secondary | ICD-10-CM | POA: Diagnosis not present

## 2014-11-02 DIAGNOSIS — I238 Other current complications following acute myocardial infarction: Secondary | ICD-10-CM | POA: Diagnosis not present

## 2014-11-04 DIAGNOSIS — N186 End stage renal disease: Secondary | ICD-10-CM | POA: Diagnosis not present

## 2014-11-04 DIAGNOSIS — D509 Iron deficiency anemia, unspecified: Secondary | ICD-10-CM | POA: Diagnosis not present

## 2014-11-04 DIAGNOSIS — N2581 Secondary hyperparathyroidism of renal origin: Secondary | ICD-10-CM | POA: Diagnosis not present

## 2014-11-04 DIAGNOSIS — Z992 Dependence on renal dialysis: Secondary | ICD-10-CM | POA: Diagnosis not present

## 2014-11-04 DIAGNOSIS — Z23 Encounter for immunization: Secondary | ICD-10-CM | POA: Diagnosis not present

## 2014-11-04 DIAGNOSIS — D631 Anemia in chronic kidney disease: Secondary | ICD-10-CM | POA: Diagnosis not present

## 2014-11-05 DIAGNOSIS — Z9012 Acquired absence of left breast and nipple: Secondary | ICD-10-CM | POA: Diagnosis not present

## 2014-11-05 DIAGNOSIS — Z9221 Personal history of antineoplastic chemotherapy: Secondary | ICD-10-CM | POA: Diagnosis not present

## 2014-11-05 DIAGNOSIS — E119 Type 2 diabetes mellitus without complications: Secondary | ICD-10-CM | POA: Diagnosis not present

## 2014-11-05 DIAGNOSIS — Z87891 Personal history of nicotine dependence: Secondary | ICD-10-CM | POA: Diagnosis not present

## 2014-11-05 DIAGNOSIS — Z853 Personal history of malignant neoplasm of breast: Secondary | ICD-10-CM | POA: Diagnosis not present

## 2014-11-05 DIAGNOSIS — I120 Hypertensive chronic kidney disease with stage 5 chronic kidney disease or end stage renal disease: Secondary | ICD-10-CM | POA: Diagnosis not present

## 2014-11-05 DIAGNOSIS — Z87440 Personal history of urinary (tract) infections: Secondary | ICD-10-CM | POA: Diagnosis not present

## 2014-11-05 DIAGNOSIS — I429 Cardiomyopathy, unspecified: Secondary | ICD-10-CM | POA: Diagnosis not present

## 2014-11-05 DIAGNOSIS — Z90710 Acquired absence of both cervix and uterus: Secondary | ICD-10-CM | POA: Diagnosis not present

## 2014-11-05 DIAGNOSIS — Z8659 Personal history of other mental and behavioral disorders: Secondary | ICD-10-CM | POA: Diagnosis not present

## 2014-11-05 DIAGNOSIS — N186 End stage renal disease: Secondary | ICD-10-CM | POA: Diagnosis not present

## 2014-11-05 DIAGNOSIS — Z01818 Encounter for other preprocedural examination: Secondary | ICD-10-CM | POA: Diagnosis not present

## 2014-11-06 DIAGNOSIS — Z23 Encounter for immunization: Secondary | ICD-10-CM | POA: Diagnosis not present

## 2014-11-06 DIAGNOSIS — D631 Anemia in chronic kidney disease: Secondary | ICD-10-CM | POA: Diagnosis not present

## 2014-11-06 DIAGNOSIS — D509 Iron deficiency anemia, unspecified: Secondary | ICD-10-CM | POA: Diagnosis not present

## 2014-11-06 DIAGNOSIS — N2581 Secondary hyperparathyroidism of renal origin: Secondary | ICD-10-CM | POA: Diagnosis not present

## 2014-11-06 DIAGNOSIS — N186 End stage renal disease: Secondary | ICD-10-CM | POA: Diagnosis not present

## 2014-11-06 DIAGNOSIS — Z992 Dependence on renal dialysis: Secondary | ICD-10-CM | POA: Diagnosis not present

## 2014-11-07 ENCOUNTER — Other Ambulatory Visit: Payer: Self-pay | Admitting: Internal Medicine

## 2014-11-07 DIAGNOSIS — N760 Acute vaginitis: Secondary | ICD-10-CM | POA: Diagnosis not present

## 2014-11-07 DIAGNOSIS — Z1231 Encounter for screening mammogram for malignant neoplasm of breast: Secondary | ICD-10-CM

## 2014-11-07 DIAGNOSIS — S90812A Abrasion, left foot, initial encounter: Secondary | ICD-10-CM | POA: Diagnosis not present

## 2014-11-07 DIAGNOSIS — E119 Type 2 diabetes mellitus without complications: Secondary | ICD-10-CM | POA: Diagnosis not present

## 2014-11-07 DIAGNOSIS — Z1239 Encounter for other screening for malignant neoplasm of breast: Secondary | ICD-10-CM | POA: Diagnosis not present

## 2014-11-07 DIAGNOSIS — Z124 Encounter for screening for malignant neoplasm of cervix: Secondary | ICD-10-CM | POA: Diagnosis not present

## 2014-11-07 DIAGNOSIS — I10 Essential (primary) hypertension: Secondary | ICD-10-CM | POA: Diagnosis not present

## 2014-11-08 DIAGNOSIS — N186 End stage renal disease: Secondary | ICD-10-CM | POA: Diagnosis not present

## 2014-11-08 DIAGNOSIS — N2581 Secondary hyperparathyroidism of renal origin: Secondary | ICD-10-CM | POA: Diagnosis not present

## 2014-11-08 DIAGNOSIS — Z992 Dependence on renal dialysis: Secondary | ICD-10-CM | POA: Diagnosis not present

## 2014-11-08 DIAGNOSIS — D509 Iron deficiency anemia, unspecified: Secondary | ICD-10-CM | POA: Diagnosis not present

## 2014-11-08 DIAGNOSIS — Z23 Encounter for immunization: Secondary | ICD-10-CM | POA: Diagnosis not present

## 2014-11-08 DIAGNOSIS — D631 Anemia in chronic kidney disease: Secondary | ICD-10-CM | POA: Diagnosis not present

## 2014-11-10 DIAGNOSIS — E13331 Other specified diabetes mellitus with moderate nonproliferative diabetic retinopathy with macular edema: Secondary | ICD-10-CM | POA: Diagnosis not present

## 2014-11-10 DIAGNOSIS — E1149 Type 2 diabetes mellitus with other diabetic neurological complication: Secondary | ICD-10-CM | POA: Diagnosis not present

## 2014-11-10 DIAGNOSIS — H25812 Combined forms of age-related cataract, left eye: Secondary | ICD-10-CM | POA: Diagnosis not present

## 2014-11-10 DIAGNOSIS — H25813 Combined forms of age-related cataract, bilateral: Secondary | ICD-10-CM | POA: Diagnosis not present

## 2014-11-11 DIAGNOSIS — D631 Anemia in chronic kidney disease: Secondary | ICD-10-CM | POA: Diagnosis not present

## 2014-11-11 DIAGNOSIS — N186 End stage renal disease: Secondary | ICD-10-CM | POA: Diagnosis not present

## 2014-11-11 DIAGNOSIS — N2581 Secondary hyperparathyroidism of renal origin: Secondary | ICD-10-CM | POA: Diagnosis not present

## 2014-11-11 DIAGNOSIS — D509 Iron deficiency anemia, unspecified: Secondary | ICD-10-CM | POA: Diagnosis not present

## 2014-11-11 DIAGNOSIS — Z23 Encounter for immunization: Secondary | ICD-10-CM | POA: Diagnosis not present

## 2014-11-11 DIAGNOSIS — Z992 Dependence on renal dialysis: Secondary | ICD-10-CM | POA: Diagnosis not present

## 2014-11-13 DIAGNOSIS — I10 Essential (primary) hypertension: Secondary | ICD-10-CM | POA: Diagnosis not present

## 2014-11-13 DIAGNOSIS — I120 Hypertensive chronic kidney disease with stage 5 chronic kidney disease or end stage renal disease: Secondary | ICD-10-CM | POA: Diagnosis not present

## 2014-11-13 DIAGNOSIS — Z992 Dependence on renal dialysis: Secondary | ICD-10-CM | POA: Diagnosis not present

## 2014-11-13 DIAGNOSIS — Z853 Personal history of malignant neoplasm of breast: Secondary | ICD-10-CM | POA: Diagnosis not present

## 2014-11-13 DIAGNOSIS — Z1211 Encounter for screening for malignant neoplasm of colon: Secondary | ICD-10-CM | POA: Diagnosis not present

## 2014-11-13 DIAGNOSIS — K589 Irritable bowel syndrome without diarrhea: Secondary | ICD-10-CM | POA: Diagnosis not present

## 2014-11-13 DIAGNOSIS — E669 Obesity, unspecified: Secondary | ICD-10-CM | POA: Diagnosis not present

## 2014-11-13 DIAGNOSIS — N186 End stage renal disease: Secondary | ICD-10-CM | POA: Diagnosis not present

## 2014-11-13 DIAGNOSIS — Z6837 Body mass index (BMI) 37.0-37.9, adult: Secondary | ICD-10-CM | POA: Diagnosis not present

## 2014-11-13 DIAGNOSIS — E1140 Type 2 diabetes mellitus with diabetic neuropathy, unspecified: Secondary | ICD-10-CM | POA: Diagnosis not present

## 2014-11-13 DIAGNOSIS — E119 Type 2 diabetes mellitus without complications: Secondary | ICD-10-CM | POA: Diagnosis not present

## 2014-11-13 DIAGNOSIS — K644 Residual hemorrhoidal skin tags: Secondary | ICD-10-CM | POA: Diagnosis not present

## 2014-11-14 DIAGNOSIS — D509 Iron deficiency anemia, unspecified: Secondary | ICD-10-CM | POA: Diagnosis not present

## 2014-11-14 DIAGNOSIS — D631 Anemia in chronic kidney disease: Secondary | ICD-10-CM | POA: Diagnosis not present

## 2014-11-14 DIAGNOSIS — Z418 Encounter for other procedures for purposes other than remedying health state: Secondary | ICD-10-CM | POA: Diagnosis not present

## 2014-11-14 DIAGNOSIS — N186 End stage renal disease: Secondary | ICD-10-CM | POA: Diagnosis not present

## 2014-11-14 DIAGNOSIS — N2581 Secondary hyperparathyroidism of renal origin: Secondary | ICD-10-CM | POA: Diagnosis not present

## 2014-11-14 DIAGNOSIS — Z992 Dependence on renal dialysis: Secondary | ICD-10-CM | POA: Diagnosis not present

## 2014-11-15 DIAGNOSIS — D631 Anemia in chronic kidney disease: Secondary | ICD-10-CM | POA: Diagnosis not present

## 2014-11-15 DIAGNOSIS — Z992 Dependence on renal dialysis: Secondary | ICD-10-CM | POA: Diagnosis not present

## 2014-11-15 DIAGNOSIS — D509 Iron deficiency anemia, unspecified: Secondary | ICD-10-CM | POA: Diagnosis not present

## 2014-11-15 DIAGNOSIS — Z418 Encounter for other procedures for purposes other than remedying health state: Secondary | ICD-10-CM | POA: Diagnosis not present

## 2014-11-15 DIAGNOSIS — N186 End stage renal disease: Secondary | ICD-10-CM | POA: Diagnosis not present

## 2014-11-15 DIAGNOSIS — N2581 Secondary hyperparathyroidism of renal origin: Secondary | ICD-10-CM | POA: Diagnosis not present

## 2014-11-18 DIAGNOSIS — Z992 Dependence on renal dialysis: Secondary | ICD-10-CM | POA: Diagnosis not present

## 2014-11-18 DIAGNOSIS — N2581 Secondary hyperparathyroidism of renal origin: Secondary | ICD-10-CM | POA: Diagnosis not present

## 2014-11-18 DIAGNOSIS — D509 Iron deficiency anemia, unspecified: Secondary | ICD-10-CM | POA: Diagnosis not present

## 2014-11-18 DIAGNOSIS — Z418 Encounter for other procedures for purposes other than remedying health state: Secondary | ICD-10-CM | POA: Diagnosis not present

## 2014-11-18 DIAGNOSIS — D631 Anemia in chronic kidney disease: Secondary | ICD-10-CM | POA: Diagnosis not present

## 2014-11-18 DIAGNOSIS — N186 End stage renal disease: Secondary | ICD-10-CM | POA: Diagnosis not present

## 2014-11-20 DIAGNOSIS — D631 Anemia in chronic kidney disease: Secondary | ICD-10-CM | POA: Diagnosis not present

## 2014-11-20 DIAGNOSIS — N186 End stage renal disease: Secondary | ICD-10-CM | POA: Diagnosis not present

## 2014-11-20 DIAGNOSIS — Z992 Dependence on renal dialysis: Secondary | ICD-10-CM | POA: Diagnosis not present

## 2014-11-20 DIAGNOSIS — Z418 Encounter for other procedures for purposes other than remedying health state: Secondary | ICD-10-CM | POA: Diagnosis not present

## 2014-11-20 DIAGNOSIS — N2581 Secondary hyperparathyroidism of renal origin: Secondary | ICD-10-CM | POA: Diagnosis not present

## 2014-11-20 DIAGNOSIS — D509 Iron deficiency anemia, unspecified: Secondary | ICD-10-CM | POA: Diagnosis not present

## 2014-11-22 DIAGNOSIS — D631 Anemia in chronic kidney disease: Secondary | ICD-10-CM | POA: Diagnosis not present

## 2014-11-22 DIAGNOSIS — N186 End stage renal disease: Secondary | ICD-10-CM | POA: Diagnosis not present

## 2014-11-22 DIAGNOSIS — N2581 Secondary hyperparathyroidism of renal origin: Secondary | ICD-10-CM | POA: Diagnosis not present

## 2014-11-22 DIAGNOSIS — Z992 Dependence on renal dialysis: Secondary | ICD-10-CM | POA: Diagnosis not present

## 2014-11-22 DIAGNOSIS — D509 Iron deficiency anemia, unspecified: Secondary | ICD-10-CM | POA: Diagnosis not present

## 2014-11-22 DIAGNOSIS — Z418 Encounter for other procedures for purposes other than remedying health state: Secondary | ICD-10-CM | POA: Diagnosis not present

## 2014-11-25 DIAGNOSIS — N2581 Secondary hyperparathyroidism of renal origin: Secondary | ICD-10-CM | POA: Diagnosis not present

## 2014-11-25 DIAGNOSIS — Z992 Dependence on renal dialysis: Secondary | ICD-10-CM | POA: Diagnosis not present

## 2014-11-25 DIAGNOSIS — N186 End stage renal disease: Secondary | ICD-10-CM | POA: Diagnosis not present

## 2014-11-25 DIAGNOSIS — E119 Type 2 diabetes mellitus without complications: Secondary | ICD-10-CM | POA: Diagnosis not present

## 2014-11-25 DIAGNOSIS — D509 Iron deficiency anemia, unspecified: Secondary | ICD-10-CM | POA: Diagnosis not present

## 2014-11-25 DIAGNOSIS — Z794 Long term (current) use of insulin: Secondary | ICD-10-CM | POA: Diagnosis not present

## 2014-11-25 DIAGNOSIS — D631 Anemia in chronic kidney disease: Secondary | ICD-10-CM | POA: Diagnosis not present

## 2014-11-25 DIAGNOSIS — Z418 Encounter for other procedures for purposes other than remedying health state: Secondary | ICD-10-CM | POA: Diagnosis not present

## 2014-11-27 DIAGNOSIS — D509 Iron deficiency anemia, unspecified: Secondary | ICD-10-CM | POA: Diagnosis not present

## 2014-11-27 DIAGNOSIS — D631 Anemia in chronic kidney disease: Secondary | ICD-10-CM | POA: Diagnosis not present

## 2014-11-27 DIAGNOSIS — Z418 Encounter for other procedures for purposes other than remedying health state: Secondary | ICD-10-CM | POA: Diagnosis not present

## 2014-11-27 DIAGNOSIS — N2581 Secondary hyperparathyroidism of renal origin: Secondary | ICD-10-CM | POA: Diagnosis not present

## 2014-11-27 DIAGNOSIS — Z992 Dependence on renal dialysis: Secondary | ICD-10-CM | POA: Diagnosis not present

## 2014-11-27 DIAGNOSIS — N186 End stage renal disease: Secondary | ICD-10-CM | POA: Diagnosis not present

## 2014-11-29 DIAGNOSIS — D631 Anemia in chronic kidney disease: Secondary | ICD-10-CM | POA: Diagnosis not present

## 2014-11-29 DIAGNOSIS — N2581 Secondary hyperparathyroidism of renal origin: Secondary | ICD-10-CM | POA: Diagnosis not present

## 2014-11-29 DIAGNOSIS — N186 End stage renal disease: Secondary | ICD-10-CM | POA: Diagnosis not present

## 2014-11-29 DIAGNOSIS — D509 Iron deficiency anemia, unspecified: Secondary | ICD-10-CM | POA: Diagnosis not present

## 2014-11-29 DIAGNOSIS — Z992 Dependence on renal dialysis: Secondary | ICD-10-CM | POA: Diagnosis not present

## 2014-11-29 DIAGNOSIS — Z418 Encounter for other procedures for purposes other than remedying health state: Secondary | ICD-10-CM | POA: Diagnosis not present

## 2014-12-01 DIAGNOSIS — Z452 Encounter for adjustment and management of vascular access device: Secondary | ICD-10-CM | POA: Diagnosis not present

## 2014-12-02 DIAGNOSIS — R220 Localized swelling, mass and lump, head: Secondary | ICD-10-CM | POA: Diagnosis not present

## 2014-12-02 DIAGNOSIS — R51 Headache: Secondary | ICD-10-CM | POA: Diagnosis not present

## 2014-12-02 DIAGNOSIS — N186 End stage renal disease: Secondary | ICD-10-CM | POA: Diagnosis not present

## 2014-12-02 DIAGNOSIS — E119 Type 2 diabetes mellitus without complications: Secondary | ICD-10-CM | POA: Diagnosis not present

## 2014-12-02 DIAGNOSIS — S20219A Contusion of unspecified front wall of thorax, initial encounter: Secondary | ICD-10-CM | POA: Diagnosis not present

## 2014-12-02 DIAGNOSIS — Z87898 Personal history of other specified conditions: Secondary | ICD-10-CM | POA: Diagnosis not present

## 2014-12-02 DIAGNOSIS — Z853 Personal history of malignant neoplasm of breast: Secondary | ICD-10-CM | POA: Diagnosis not present

## 2014-12-02 DIAGNOSIS — Z794 Long term (current) use of insulin: Secondary | ICD-10-CM | POA: Diagnosis not present

## 2014-12-02 DIAGNOSIS — N189 Chronic kidney disease, unspecified: Secondary | ICD-10-CM | POA: Diagnosis not present

## 2014-12-02 DIAGNOSIS — D631 Anemia in chronic kidney disease: Secondary | ICD-10-CM | POA: Diagnosis not present

## 2014-12-02 DIAGNOSIS — Z9012 Acquired absence of left breast and nipple: Secondary | ICD-10-CM | POA: Diagnosis not present

## 2014-12-02 DIAGNOSIS — I129 Hypertensive chronic kidney disease with stage 1 through stage 4 chronic kidney disease, or unspecified chronic kidney disease: Secondary | ICD-10-CM | POA: Diagnosis not present

## 2014-12-02 DIAGNOSIS — R600 Localized edema: Secondary | ICD-10-CM | POA: Diagnosis not present

## 2014-12-02 DIAGNOSIS — Z418 Encounter for other procedures for purposes other than remedying health state: Secondary | ICD-10-CM | POA: Diagnosis not present

## 2014-12-02 DIAGNOSIS — I770 Arteriovenous fistula, acquired: Secondary | ICD-10-CM | POA: Diagnosis not present

## 2014-12-02 DIAGNOSIS — Z87891 Personal history of nicotine dependence: Secondary | ICD-10-CM | POA: Diagnosis not present

## 2014-12-02 DIAGNOSIS — N390 Urinary tract infection, site not specified: Secondary | ICD-10-CM | POA: Diagnosis not present

## 2014-12-02 DIAGNOSIS — N2581 Secondary hyperparathyroidism of renal origin: Secondary | ICD-10-CM | POA: Diagnosis not present

## 2014-12-02 DIAGNOSIS — J9811 Atelectasis: Secondary | ICD-10-CM | POA: Diagnosis not present

## 2014-12-02 DIAGNOSIS — D509 Iron deficiency anemia, unspecified: Secondary | ICD-10-CM | POA: Diagnosis not present

## 2014-12-02 DIAGNOSIS — R221 Localized swelling, mass and lump, neck: Secondary | ICD-10-CM | POA: Diagnosis not present

## 2014-12-02 DIAGNOSIS — Z992 Dependence on renal dialysis: Secondary | ICD-10-CM | POA: Diagnosis not present

## 2014-12-02 DIAGNOSIS — Z79899 Other long term (current) drug therapy: Secondary | ICD-10-CM | POA: Diagnosis not present

## 2014-12-04 DIAGNOSIS — N186 End stage renal disease: Secondary | ICD-10-CM | POA: Diagnosis not present

## 2014-12-04 DIAGNOSIS — Z992 Dependence on renal dialysis: Secondary | ICD-10-CM | POA: Diagnosis not present

## 2014-12-04 DIAGNOSIS — Z418 Encounter for other procedures for purposes other than remedying health state: Secondary | ICD-10-CM | POA: Diagnosis not present

## 2014-12-04 DIAGNOSIS — N2581 Secondary hyperparathyroidism of renal origin: Secondary | ICD-10-CM | POA: Diagnosis not present

## 2014-12-04 DIAGNOSIS — D509 Iron deficiency anemia, unspecified: Secondary | ICD-10-CM | POA: Diagnosis not present

## 2014-12-04 DIAGNOSIS — D631 Anemia in chronic kidney disease: Secondary | ICD-10-CM | POA: Diagnosis not present

## 2014-12-06 DIAGNOSIS — D631 Anemia in chronic kidney disease: Secondary | ICD-10-CM | POA: Diagnosis not present

## 2014-12-06 DIAGNOSIS — N2581 Secondary hyperparathyroidism of renal origin: Secondary | ICD-10-CM | POA: Diagnosis not present

## 2014-12-06 DIAGNOSIS — D509 Iron deficiency anemia, unspecified: Secondary | ICD-10-CM | POA: Diagnosis not present

## 2014-12-06 DIAGNOSIS — Z418 Encounter for other procedures for purposes other than remedying health state: Secondary | ICD-10-CM | POA: Diagnosis not present

## 2014-12-06 DIAGNOSIS — N186 End stage renal disease: Secondary | ICD-10-CM | POA: Diagnosis not present

## 2014-12-06 DIAGNOSIS — Z992 Dependence on renal dialysis: Secondary | ICD-10-CM | POA: Diagnosis not present

## 2014-12-08 DIAGNOSIS — E11621 Type 2 diabetes mellitus with foot ulcer: Secondary | ICD-10-CM | POA: Diagnosis not present

## 2014-12-08 DIAGNOSIS — I2510 Atherosclerotic heart disease of native coronary artery without angina pectoris: Secondary | ICD-10-CM | POA: Diagnosis not present

## 2014-12-09 DIAGNOSIS — Z992 Dependence on renal dialysis: Secondary | ICD-10-CM | POA: Diagnosis not present

## 2014-12-09 DIAGNOSIS — N2581 Secondary hyperparathyroidism of renal origin: Secondary | ICD-10-CM | POA: Diagnosis not present

## 2014-12-09 DIAGNOSIS — N186 End stage renal disease: Secondary | ICD-10-CM | POA: Diagnosis not present

## 2014-12-09 DIAGNOSIS — D631 Anemia in chronic kidney disease: Secondary | ICD-10-CM | POA: Diagnosis not present

## 2014-12-09 DIAGNOSIS — Z418 Encounter for other procedures for purposes other than remedying health state: Secondary | ICD-10-CM | POA: Diagnosis not present

## 2014-12-09 DIAGNOSIS — D509 Iron deficiency anemia, unspecified: Secondary | ICD-10-CM | POA: Diagnosis not present

## 2014-12-11 DIAGNOSIS — N2581 Secondary hyperparathyroidism of renal origin: Secondary | ICD-10-CM | POA: Diagnosis not present

## 2014-12-11 DIAGNOSIS — N186 End stage renal disease: Secondary | ICD-10-CM | POA: Diagnosis not present

## 2014-12-11 DIAGNOSIS — Z418 Encounter for other procedures for purposes other than remedying health state: Secondary | ICD-10-CM | POA: Diagnosis not present

## 2014-12-11 DIAGNOSIS — D631 Anemia in chronic kidney disease: Secondary | ICD-10-CM | POA: Diagnosis not present

## 2014-12-11 DIAGNOSIS — Z992 Dependence on renal dialysis: Secondary | ICD-10-CM | POA: Diagnosis not present

## 2014-12-11 DIAGNOSIS — D509 Iron deficiency anemia, unspecified: Secondary | ICD-10-CM | POA: Diagnosis not present

## 2014-12-11 NOTE — Patient Instructions (Addendum)
Alexis [name]  12/11/2014     @PREFPERIOPPHARMACY @   Your procedure is scheduled on 12/22/2014  Report to [facility] at 6:30 A.M.  Call this number if you have problems the morning of surgery:  [phone]   Remember:  Do not eat food or drink liquids after midnight.  Take these medicines the morning of surgery with A SIP OF WATER Coreg, Apresoline. TAKE ONLY 1/2 DOSE OF EVENING INSULIN NIGHT PRIOR TO SURGERY AND NONE THE AM OF SURGERY   Do not wear jewelry, make-up or nail polish.  Do not wear lotions, powders, or perfumes.  You may wear deodorant.  Do not shave 48 hours prior to surgery.  Men may shave face and neck.  Do not bring valuables to the hospital.  [facility] is not responsible for any belongings or valuables.  Contacts, dentures or bridgework may not be worn into surgery.  Leave your suitcase in the car.  After surgery it may be brought to your room.  For patients admitted to the hospital, discharge time will be determined by your treatment team.  Patients discharged the day of surgery will not be allowed to drive home.   Please read over the following fact sheets that you were given. Surgical Site Infection Prevention and Anesthesia Post-op Instructions   PATIENT INSTRUCTIONS POST-ANESTHESIA  IMMEDIATELY FOLLOWING SURGERY:  Do not drive or operate machinery for the first twenty four hours after surgery.  Do not make any important decisions for twenty four hours after surgery or while taking narcotic pain medications or sedatives.  If you develop intractable nausea and vomiting or a severe headache please notify your doctor immediately.  FOLLOW-UP:  Please make an appointment with your surgeon as instructed. You do not need to follow up with anesthesia unless specifically instructed to do so.  WOUND CARE INSTRUCTIONS (if applicable):  Keep a dry clean dressing on the anesthesia/puncture wound site if there is drainage.  Once the wound has quit draining you may  leave it open to air.  Generally you should leave the bandage intact for twenty four hours unless there is drainage.  If the epidural site drains for more than 36-48 hours please call the anesthesia department.  QUESTIONS?:  Please feel free to call your physician or the hospital operator if you have any questions, and they will be happy to assist you.         Cataract Surgery  A cataract is a clouding of the lens of the eye. When a lens becomes cloudy, vision is reduced based on the degree and nature of the clouding. Surgery may be needed to improve vision. Surgery removes the cloudy lens and usually replaces it with a substitute lens (intraocular lens, IOL). LET YOUR EYE DOCTOR KNOW ABOUT:  Allergies to food or medicine.  Medicines taken including herbs, eye drops, over-the-counter medicines, and creams.  Use of steroids (by mouth or creams).  Previous problems with anesthetics or numbing medicine.  History of bleeding problems or blood clots.  Previous surgery.  Other health problems, including diabetes and kidney problems.  Possibility of pregnancy, if this applies. RISKS AND COMPLICATIONS  Infection.  Inflammation of the eyeball (endophthalmitis) that can spread to both eyes (sympathetic ophthalmia).  Poor wound healing.  If an IOL is inserted, it can later fall out of proper position. This is very uncommon.  Clouding of the part of your eye that holds an IOL in place. This is called an "after-cataract." These are uncommon  but easily treated. BEFORE THE PROCEDURE  Do not eat or drink anything except small amounts of water for 8 to 12 before your surgery, or as directed by your caregiver.  Unless you are told otherwise, continue any eye drops you have been prescribed.  Talk to your primary caregiver about all other medicines that you take (both prescription and nonprescription). In some cases, you may need to stop or change medicines near the time of your surgery. This  is most important if you are taking blood-thinning medicine.Do not stop medicines unless you are told to do so.  Arrange for someone to drive you to and from the procedure.  Do not put contact lenses in either eye on the day of your surgery. PROCEDURE There is more than one method for safely removing a cataract. Your doctor can explain the differences and help determine which is best for you. Phacoemulsification surgery is the most common form of cataract surgery.  An injection is given behind the eye or eye drops are given to make this a painless procedure.  A small cut (incision) is made on the edge of the clear, dome-shaped surface that covers the front of the eye (cornea).  A tiny probe is painlessly inserted into the eye. This device gives off ultrasound waves that soften and break up the cloudy center of the lens. This makes it easier for the cloudy lens to be removed by suction.  An IOL may be implanted.  The normal lens of the eye is covered by a clear capsule. Part of that capsule is intentionally left in the eye to support the IOL.  Your surgeon may or may not use stitches to close the incision. There are other forms of cataract surgery that require a larger incision and stitches to close the eye. This approach is taken in cases where the doctor feels that the cataract cannot be easily removed using phacoemulsification. AFTER THE PROCEDURE  When an IOL is implanted, it does not need care. It becomes a permanent part of your eye and cannot be seen or felt.  Your doctor will schedule follow-up exams to check on your progress.  Review your other medicines with your doctor to see which can be resumed after surgery.  Use eye drops or take medicine as prescribed by your doctor. Document Released: 04/21/2011 Document Revised: 09/16/2013 Document Reviewed: 04/21/2011 [facility] Patient Information 2015 [place], [place]. This information is not intended to replace advice given to you by  your health care provider. Make sure you discuss any questions you have with your health care provider.

## 2014-12-13 DIAGNOSIS — N2581 Secondary hyperparathyroidism of renal origin: Secondary | ICD-10-CM | POA: Diagnosis not present

## 2014-12-13 DIAGNOSIS — Z992 Dependence on renal dialysis: Secondary | ICD-10-CM | POA: Diagnosis not present

## 2014-12-13 DIAGNOSIS — D509 Iron deficiency anemia, unspecified: Secondary | ICD-10-CM | POA: Diagnosis not present

## 2014-12-13 DIAGNOSIS — Z418 Encounter for other procedures for purposes other than remedying health state: Secondary | ICD-10-CM | POA: Diagnosis not present

## 2014-12-13 DIAGNOSIS — D631 Anemia in chronic kidney disease: Secondary | ICD-10-CM | POA: Diagnosis not present

## 2014-12-13 DIAGNOSIS — N186 End stage renal disease: Secondary | ICD-10-CM | POA: Diagnosis not present

## 2014-12-15 ENCOUNTER — Encounter (HOSPITAL_COMMUNITY): Payer: Self-pay

## 2014-12-15 ENCOUNTER — Encounter (HOSPITAL_COMMUNITY)
Admission: RE | Admit: 2014-12-15 | Discharge: 2014-12-15 | Disposition: A | Discharge status: Home / Self Care | Payer: Medicare Other | Source: Ambulatory Visit | Attending: Ophthalmology | Admitting: Ophthalmology

## 2014-12-16 DIAGNOSIS — D509 Iron deficiency anemia, unspecified: Secondary | ICD-10-CM | POA: Diagnosis not present

## 2014-12-16 DIAGNOSIS — Z992 Dependence on renal dialysis: Secondary | ICD-10-CM | POA: Diagnosis not present

## 2014-12-16 DIAGNOSIS — N2581 Secondary hyperparathyroidism of renal origin: Secondary | ICD-10-CM | POA: Diagnosis not present

## 2014-12-16 DIAGNOSIS — D631 Anemia in chronic kidney disease: Secondary | ICD-10-CM | POA: Diagnosis not present

## 2014-12-16 DIAGNOSIS — N186 End stage renal disease: Secondary | ICD-10-CM | POA: Diagnosis not present

## 2014-12-18 DIAGNOSIS — N2581 Secondary hyperparathyroidism of renal origin: Secondary | ICD-10-CM | POA: Diagnosis not present

## 2014-12-18 DIAGNOSIS — Z992 Dependence on renal dialysis: Secondary | ICD-10-CM | POA: Diagnosis not present

## 2014-12-18 DIAGNOSIS — D509 Iron deficiency anemia, unspecified: Secondary | ICD-10-CM | POA: Diagnosis not present

## 2014-12-18 DIAGNOSIS — D631 Anemia in chronic kidney disease: Secondary | ICD-10-CM | POA: Diagnosis not present

## 2014-12-18 DIAGNOSIS — N186 End stage renal disease: Secondary | ICD-10-CM | POA: Diagnosis not present

## 2014-12-19 MED ORDER — LIDOCAINE HCL (PF) 1 % IJ SOLN
INTRAMUSCULAR | Status: AC
  Filled 2014-12-19: qty 2

## 2014-12-19 MED ORDER — PHENYLEPHRINE HCL 2.5 % OP SOLN
OPHTHALMIC | Status: AC
  Filled 2014-12-19: qty 15

## 2014-12-19 MED ORDER — NEOMYCIN-POLYMYXIN-DEXAMETH 3.5-10000-0.1 OP SUSP
OPHTHALMIC | Status: AC
  Filled 2014-12-19: qty 5

## 2014-12-19 MED ORDER — LIDOCAINE HCL 3.5 % OP GEL
OPHTHALMIC | Status: AC
  Filled 2014-12-19: qty 1

## 2014-12-19 MED ORDER — CYCLOPENTOLATE-PHENYLEPHRINE OP SOLN OPTIME - NO CHARGE
OPHTHALMIC | Status: AC
Start: 2014-12-19 — End: 2014-12-19
  Filled 2014-12-19: qty 2

## 2014-12-19 MED ORDER — TETRACAINE HCL 0.5 % OP SOLN
OPHTHALMIC | Status: AC
  Filled 2014-12-19: qty 2

## 2014-12-20 DIAGNOSIS — N186 End stage renal disease: Secondary | ICD-10-CM | POA: Diagnosis not present

## 2014-12-20 DIAGNOSIS — N2581 Secondary hyperparathyroidism of renal origin: Secondary | ICD-10-CM | POA: Diagnosis not present

## 2014-12-20 DIAGNOSIS — D509 Iron deficiency anemia, unspecified: Secondary | ICD-10-CM | POA: Diagnosis not present

## 2014-12-20 DIAGNOSIS — D631 Anemia in chronic kidney disease: Secondary | ICD-10-CM | POA: Diagnosis not present

## 2014-12-20 DIAGNOSIS — Z992 Dependence on renal dialysis: Secondary | ICD-10-CM | POA: Diagnosis not present

## 2014-12-22 ENCOUNTER — Encounter (HOSPITAL_COMMUNITY): Payer: Self-pay | Admitting: Ophthalmology

## 2014-12-22 ENCOUNTER — Encounter (HOSPITAL_COMMUNITY)
Admission: RE | Disposition: A | Discharge status: Home / Self Care | Payer: Self-pay | Source: Ambulatory Visit | Attending: Ophthalmology

## 2014-12-22 ENCOUNTER — Ambulatory Visit (HOSPITAL_COMMUNITY)
Admission: RE | Admit: 2014-12-22 | Discharge: 2014-12-22 | Disposition: A | Discharge status: Home / Self Care | Payer: Medicare Other | Source: Ambulatory Visit | Attending: Ophthalmology | Admitting: Ophthalmology

## 2014-12-22 ENCOUNTER — Ambulatory Visit (HOSPITAL_COMMUNITY): Payer: Medicare Other | Admitting: Anesthesiology

## 2014-12-22 DIAGNOSIS — E119 Type 2 diabetes mellitus without complications: Secondary | ICD-10-CM | POA: Diagnosis not present

## 2014-12-22 DIAGNOSIS — Z992 Dependence on renal dialysis: Secondary | ICD-10-CM | POA: Diagnosis not present

## 2014-12-22 DIAGNOSIS — Z853 Personal history of malignant neoplasm of breast: Secondary | ICD-10-CM | POA: Diagnosis not present

## 2014-12-22 DIAGNOSIS — H269 Unspecified cataract: Secondary | ICD-10-CM | POA: Diagnosis not present

## 2014-12-22 DIAGNOSIS — Z87891 Personal history of nicotine dependence: Secondary | ICD-10-CM | POA: Insufficient documentation

## 2014-12-22 DIAGNOSIS — I10 Essential (primary) hypertension: Secondary | ICD-10-CM | POA: Insufficient documentation

## 2014-12-22 DIAGNOSIS — N189 Chronic kidney disease, unspecified: Secondary | ICD-10-CM | POA: Insufficient documentation

## 2014-12-22 DIAGNOSIS — Z794 Long term (current) use of insulin: Secondary | ICD-10-CM | POA: Insufficient documentation

## 2014-12-22 DIAGNOSIS — Z79899 Other long term (current) drug therapy: Secondary | ICD-10-CM | POA: Insufficient documentation

## 2014-12-22 DIAGNOSIS — H25812 Combined forms of age-related cataract, left eye: Secondary | ICD-10-CM | POA: Insufficient documentation

## 2014-12-22 DIAGNOSIS — H2512 Age-related nuclear cataract, left eye: Secondary | ICD-10-CM | POA: Diagnosis not present

## 2014-12-22 HISTORY — PX: CATARACT EXTRACTION W/PHACO: SHX586

## 2014-12-22 LAB — POCT I-STAT, CHEM 8
BUN: 39 mg/dL — AB (ref 6–20)
CALCIUM ION: 1.06 mmol/L — AB (ref 1.13–1.30)
CREATININE: 5.4 mg/dL — AB (ref 0.44–1.00)
Chloride: 101 mmol/L (ref 101–111)
Glucose, Bld: 148 mg/dL — ABNORMAL HIGH (ref 65–99)
HCT: 40 % (ref 36.0–46.0)
Hemoglobin: 13.6 g/dL (ref 12.0–15.0)
Potassium: 4.3 mmol/L (ref 3.5–5.1)
Sodium: 138 mmol/L (ref 135–145)
TCO2: 26 mmol/L (ref 0–100)

## 2014-12-22 SURGERY — PHACOEMULSIFICATION, CATARACT, WITH IOL INSERTION
Anesthesia: Monitor Anesthesia Care | Site: Eye | Laterality: Left

## 2014-12-22 MED ORDER — PHENYLEPHRINE HCL 2.5 % OP SOLN
1.0000 [drp] | OPHTHALMIC | Status: AC
  Administered 2014-12-22 (×3): 1 [drp] via OPHTHALMIC

## 2014-12-22 MED ORDER — TETRACAINE HCL 0.5 % OP SOLN
1.0000 [drp] | OPHTHALMIC | Status: AC
  Administered 2014-12-22 (×3): 1 [drp] via OPHTHALMIC

## 2014-12-22 MED ORDER — LACTATED RINGERS IV SOLN
INTRAVENOUS | Status: DC
  Administered 2014-12-22: 08:00:00 via INTRAVENOUS

## 2014-12-22 MED ORDER — PROVISC 10 MG/ML IO SOLN
INTRAOCULAR | Status: DC | PRN
Start: 2014-12-22 — End: 2014-12-22
  Administered 2014-12-22: 0.85 mL via INTRAOCULAR

## 2014-12-22 MED ORDER — BSS IO SOLN
INTRAOCULAR | Status: DC | PRN
  Administered 2014-12-22: 15 mL

## 2014-12-22 MED ORDER — POVIDONE-IODINE 5 % OP SOLN
OPHTHALMIC | Status: DC | PRN
  Administered 2014-12-22: 1 via OPHTHALMIC

## 2014-12-22 MED ORDER — LIDOCAINE HCL 3.5 % OP GEL
1.0000 "application " | Freq: Once | OPHTHALMIC | Status: AC
  Administered 2014-12-22: 1 via OPHTHALMIC

## 2014-12-22 MED ORDER — EPINEPHRINE HCL 1 MG/ML IJ SOLN
INTRAOCULAR | Status: DC | PRN
  Administered 2014-12-22: 500 mL

## 2014-12-22 MED ORDER — NEOMYCIN-POLYMYXIN-DEXAMETH 3.5-10000-0.1 OP SUSP
OPHTHALMIC | Status: DC | PRN
Start: 2014-12-22 — End: 2014-12-22
  Administered 2014-12-22: 2 [drp] via OPHTHALMIC

## 2014-12-22 MED ORDER — LIDOCAINE HCL (PF) 1 % IJ SOLN
INTRAMUSCULAR | Status: DC | PRN
Start: 2014-12-22 — End: 2014-12-22
  Administered 2014-12-22: .8 mL

## 2014-12-22 MED ORDER — CYCLOPENTOLATE-PHENYLEPHRINE 0.2-1 % OP SOLN
1.0000 [drp] | OPHTHALMIC | Status: AC
  Administered 2014-12-22 (×3): 1 [drp] via OPHTHALMIC

## 2014-12-22 MED ORDER — MIDAZOLAM HCL 2 MG/2ML IJ SOLN
1.0000 mg | INTRAMUSCULAR | Status: DC | PRN
  Administered 2014-12-22: 2 mg via INTRAVENOUS

## 2014-12-22 MED ORDER — EPINEPHRINE HCL 1 MG/ML IJ SOLN
INTRAMUSCULAR | Status: AC
  Filled 2014-12-22: qty 1

## 2014-12-22 MED ORDER — MIDAZOLAM HCL 2 MG/2ML IJ SOLN
INTRAMUSCULAR | Status: AC
  Filled 2014-12-22: qty 2

## 2014-12-22 MED ORDER — FENTANYL CITRATE (PF) 100 MCG/2ML IJ SOLN
INTRAMUSCULAR | Status: AC
  Filled 2014-12-22: qty 2

## 2014-12-22 MED ORDER — FENTANYL CITRATE (PF) 100 MCG/2ML IJ SOLN
25.0000 ug | INTRAMUSCULAR | Status: AC
  Administered 2014-12-22 (×2): 25 ug via INTRAVENOUS

## 2014-12-22 SURGICAL SUPPLY — 34 items
CAPSULAR TENSION RING-AMO (OPHTHALMIC RELATED) IMPLANT
CLOTH BEACON ORANGE TIMEOUT ST (SAFETY) ×3 IMPLANT
EYE SHIELD UNIVERSAL CLEAR (GAUZE/BANDAGES/DRESSINGS) ×3 IMPLANT
GLOVE BIO SURGEON STRL SZ 6.5 (GLOVE) IMPLANT
GLOVE BIO SURGEONS STRL SZ 6.5 (GLOVE)
GLOVE BIOGEL PI IND STRL 6.5 (GLOVE) ×1 IMPLANT
GLOVE BIOGEL PI IND STRL 7.0 (GLOVE) IMPLANT
GLOVE BIOGEL PI IND STRL 7.5 (GLOVE) IMPLANT
GLOVE BIOGEL PI INDICATOR 6.5 (GLOVE) ×2
GLOVE BIOGEL PI INDICATOR 7.0 (GLOVE)
GLOVE BIOGEL PI INDICATOR 7.5 (GLOVE)
GLOVE ECLIPSE 6.5 STRL STRAW (GLOVE) IMPLANT
GLOVE ECLIPSE 7.0 STRL STRAW (GLOVE) IMPLANT
GLOVE ECLIPSE 7.5 STRL STRAW (GLOVE) IMPLANT
GLOVE EXAM NITRILE LRG STRL (GLOVE) IMPLANT
GLOVE EXAM NITRILE MD LF STRL (GLOVE) ×3 IMPLANT
GLOVE SKINSENSE NS SZ6.5 (GLOVE)
GLOVE SKINSENSE NS SZ7.0 (GLOVE)
GLOVE SKINSENSE STRL SZ6.5 (GLOVE) IMPLANT
GLOVE SKINSENSE STRL SZ7.0 (GLOVE) IMPLANT
KIT VITRECTOMY (OPHTHALMIC RELATED) IMPLANT
PAD ARMBOARD 7.5X6 YLW CONV (MISCELLANEOUS) ×3 IMPLANT
PROC W NO LENS (INTRAOCULAR LENS)
PROC W SPEC LENS (INTRAOCULAR LENS)
PROCESS W NO LENS (INTRAOCULAR LENS) IMPLANT
PROCESS W SPEC LENS (INTRAOCULAR LENS) IMPLANT
RETRACTOR IRIS SIGHTPATH (OPHTHALMIC RELATED) IMPLANT
RING MALYGIN (MISCELLANEOUS) IMPLANT
SIGHTPATH CAT PROC W REG LENS (Ophthalmic Related) ×3 IMPLANT
SYRINGE LUER LOK 1CC (MISCELLANEOUS) ×3 IMPLANT
TAPE SURG TRANSPORE 1 IN (GAUZE/BANDAGES/DRESSINGS) ×1 IMPLANT
TAPE SURGICAL TRANSPORE 1 IN (GAUZE/BANDAGES/DRESSINGS) ×2
VISCOELASTIC ADDITIONAL (OPHTHALMIC RELATED) IMPLANT
WATER STERILE IRR 250ML POUR (IV SOLUTION) ×3 IMPLANT

## 2014-12-22 NOTE — H&P (Signed)
I have reviewed the H&P, the patient was re-examined, and I have identified no interval changes in medical condition and plan of care since the history and physical of record  

## 2014-12-22 NOTE — Anesthesia Preprocedure Evaluation (Addendum)
Anesthesia Evaluation  Patient identified by MRN, date of birth, ID band Patient awake    Reviewed: Allergy & Precautions, NPO status , Patient's Chart, lab work & pertinent test results, reviewed documented beta blocker date and time   Airway Mallampati: II   Neck ROM: Full    Dental  (+) Edentulous Upper, Dental Advisory Given   Pulmonary shortness of breath and with exertion, COPDformer smoker,  breath sounds clear to auscultation        Cardiovascular hypertension, Pt. on medications + CAD, + Past MI and +CHF  ECHO 2015 EF 45%   Neuro/Psych    GI/Hepatic   Endo/Other  diabetes, Poorly Controlled, Type 2  Renal/GU DialysisRenal disease     Musculoskeletal   Abdominal (+)  Abdomen: soft.    Peds  Hematology 14/42   Anesthesia Other Findings   Reproductive/Obstetrics                            Anesthesia Physical Anesthesia Plan  ASA: III  Anesthesia Plan: MAC   Post-op Pain Management:    Induction: Intravenous  Airway Management Planned: Nasal Cannula  Additional Equipment:   Intra-op Plan:   Post-operative Plan:   Informed Consent: I have reviewed the patients History and Physical, chart, labs and discussed the procedure including the risks, benefits and alternatives for the proposed anesthesia with the patient or authorized representative who has indicated his/her understanding and acceptance.     Plan Discussed with:   Anesthesia Plan Comments:         Anesthesia Quick Evaluation

## 2014-12-22 NOTE — Discharge Instructions (Signed)

## 2014-12-22 NOTE — Op Note (Signed)
Date of Admission: 12/22/2014  Date of Surgery: 12/22/2014   Pre-Op Dx: Cataract Left Eye  Post-Op Dx: Senile Combined Cataract Left  Eye,  Dx Code [id number]  Surgeon: Tonny Branch, M.D.  Assistants: None  Anesthesia: Topical with MAC  Indications: Painless, progressive loss of vision with compromise of daily activities.  Surgery: Cataract Extraction with Intraocular lens Implant Left Eye  Discription: The patient had dilating drops and viscous lidocaine placed into the Left eye in the pre-op holding area. After transfer to the operating room, a time out was performed. The patient was then prepped and draped. Beginning with a 59 degree blade a paracentesis port was made at the surgeon's 2 o'clock position. The anterior chamber was then filled with 1% non-preserved lidocaine. This was followed by filling the anterior chamber with Provisc.  A 2.36mm keratome blade was used to make a clear corneal incision at the temporal limbus.  A bent cystatome needle was used to create a continuous tear capsulotomy. Hydrodissection was performed with balanced salt solution on a Fine canula. The lens nucleus was then removed using the phacoemulsification handpiece. Residual cortex was removed with the I&A handpiece. The anterior chamber and capsular bag were refilled with Provisc. A posterior chamber intraocular lens was placed into the capsular bag with it's injector. The implant was positioned with the Kuglan hook. The Provisc was then removed from the anterior chamber and capsular bag with the I&A handpiece. Stromal hydration of the main incision and paracentesis port was performed with BSS on a Fine canula. The wounds were tested for leak which was negative. The patient tolerated the procedure well. There were no operative complications. The patient was then transferred to the recovery room in stable condition.  Complications: None  Specimen: None  EBL: None  Prosthetic device: Hoya iSert 250, power 20.0 D, SN  [id number].

## 2014-12-22 NOTE — Anesthesia Postprocedure Evaluation (Signed)
  Anesthesia Post-op Note  Patient: Alexis [name]  Procedure(s) Performed: Procedure(s) (LRB): CATARACT EXTRACTION PHACO AND INTRAOCULAR LENS PLACEMENT (IOC) (Left)  Patient Location:  Short Stay  Anesthesia Type: MAC  Level of Consciousness: awake  Airway and Oxygen Therapy: Patient Spontanous Breathing  Post-op Pain: none  Post-op Assessment: Post-op Vital signs reviewed, Patient's Cardiovascular Status Stable, Respiratory Function Stable, Patent Airway, No signs of Nausea or vomiting and Pain level controlled  Post-op Vital Signs: Reviewed and stable  Complications: No apparent anesthesia complications

## 2014-12-22 NOTE — Transfer of Care (Signed)
Immediate Anesthesia Transfer of Care Note  Patient: Alexis [name]  Procedure(s) Performed: Procedure(s) with comments: CATARACT EXTRACTION PHACO AND INTRAOCULAR LENS PLACEMENT (IOC) (Left) - CDE 13.48  Patient Location: Short Stay  Anesthesia Type:MAC  Level of Consciousness: awake, alert  and oriented  Airway & Oxygen Therapy: Patient Spontanous Breathing  Post-op Assessment: Report given to RN and Post -op Vital signs reviewed and stable  Post vital signs: Reviewed and stable  Complications: No apparent anesthesia complications

## 2014-12-23 ENCOUNTER — Encounter (HOSPITAL_COMMUNITY): Payer: Self-pay | Admitting: Ophthalmology

## 2014-12-23 DIAGNOSIS — N186 End stage renal disease: Secondary | ICD-10-CM | POA: Diagnosis not present

## 2014-12-23 DIAGNOSIS — D631 Anemia in chronic kidney disease: Secondary | ICD-10-CM | POA: Diagnosis not present

## 2014-12-23 DIAGNOSIS — Z992 Dependence on renal dialysis: Secondary | ICD-10-CM | POA: Diagnosis not present

## 2014-12-23 DIAGNOSIS — N2581 Secondary hyperparathyroidism of renal origin: Secondary | ICD-10-CM | POA: Diagnosis not present

## 2014-12-23 DIAGNOSIS — D509 Iron deficiency anemia, unspecified: Secondary | ICD-10-CM | POA: Diagnosis not present

## 2014-12-25 DIAGNOSIS — Z992 Dependence on renal dialysis: Secondary | ICD-10-CM | POA: Diagnosis not present

## 2014-12-25 DIAGNOSIS — D509 Iron deficiency anemia, unspecified: Secondary | ICD-10-CM | POA: Diagnosis not present

## 2014-12-25 DIAGNOSIS — D631 Anemia in chronic kidney disease: Secondary | ICD-10-CM | POA: Diagnosis not present

## 2014-12-25 DIAGNOSIS — N2581 Secondary hyperparathyroidism of renal origin: Secondary | ICD-10-CM | POA: Diagnosis not present

## 2014-12-25 DIAGNOSIS — N186 End stage renal disease: Secondary | ICD-10-CM | POA: Diagnosis not present

## 2014-12-27 DIAGNOSIS — D509 Iron deficiency anemia, unspecified: Secondary | ICD-10-CM | POA: Diagnosis not present

## 2014-12-27 DIAGNOSIS — N2581 Secondary hyperparathyroidism of renal origin: Secondary | ICD-10-CM | POA: Diagnosis not present

## 2014-12-27 DIAGNOSIS — D631 Anemia in chronic kidney disease: Secondary | ICD-10-CM | POA: Diagnosis not present

## 2014-12-27 DIAGNOSIS — N186 End stage renal disease: Secondary | ICD-10-CM | POA: Diagnosis not present

## 2014-12-27 DIAGNOSIS — Z992 Dependence on renal dialysis: Secondary | ICD-10-CM | POA: Diagnosis not present

## 2014-12-29 DIAGNOSIS — Z961 Presence of intraocular lens: Secondary | ICD-10-CM | POA: Diagnosis not present

## 2014-12-29 DIAGNOSIS — H25811 Combined forms of age-related cataract, right eye: Secondary | ICD-10-CM | POA: Diagnosis not present

## 2014-12-29 DIAGNOSIS — H40053 Ocular hypertension, bilateral: Secondary | ICD-10-CM | POA: Diagnosis not present

## 2014-12-30 DIAGNOSIS — Z992 Dependence on renal dialysis: Secondary | ICD-10-CM | POA: Diagnosis not present

## 2014-12-30 DIAGNOSIS — N186 End stage renal disease: Secondary | ICD-10-CM | POA: Diagnosis not present

## 2014-12-30 DIAGNOSIS — D509 Iron deficiency anemia, unspecified: Secondary | ICD-10-CM | POA: Diagnosis not present

## 2014-12-30 DIAGNOSIS — D631 Anemia in chronic kidney disease: Secondary | ICD-10-CM | POA: Diagnosis not present

## 2014-12-30 DIAGNOSIS — N2581 Secondary hyperparathyroidism of renal origin: Secondary | ICD-10-CM | POA: Diagnosis not present

## 2015-01-01 DIAGNOSIS — Z992 Dependence on renal dialysis: Secondary | ICD-10-CM | POA: Diagnosis not present

## 2015-01-01 DIAGNOSIS — N2581 Secondary hyperparathyroidism of renal origin: Secondary | ICD-10-CM | POA: Diagnosis not present

## 2015-01-01 DIAGNOSIS — D509 Iron deficiency anemia, unspecified: Secondary | ICD-10-CM | POA: Diagnosis not present

## 2015-01-01 DIAGNOSIS — D631 Anemia in chronic kidney disease: Secondary | ICD-10-CM | POA: Diagnosis not present

## 2015-01-01 DIAGNOSIS — N186 End stage renal disease: Secondary | ICD-10-CM | POA: Diagnosis not present

## 2015-01-03 DIAGNOSIS — Z992 Dependence on renal dialysis: Secondary | ICD-10-CM | POA: Diagnosis not present

## 2015-01-03 DIAGNOSIS — N186 End stage renal disease: Secondary | ICD-10-CM | POA: Diagnosis not present

## 2015-01-03 DIAGNOSIS — N2581 Secondary hyperparathyroidism of renal origin: Secondary | ICD-10-CM | POA: Diagnosis not present

## 2015-01-03 DIAGNOSIS — D631 Anemia in chronic kidney disease: Secondary | ICD-10-CM | POA: Diagnosis not present

## 2015-01-03 DIAGNOSIS — D509 Iron deficiency anemia, unspecified: Secondary | ICD-10-CM | POA: Diagnosis not present

## 2015-01-06 ENCOUNTER — Encounter (HOSPITAL_COMMUNITY)
Admission: RE | Admit: 2015-01-06 | Discharge: 2015-01-06 | Disposition: A | Discharge status: Home / Self Care | Payer: Medicare Other | Source: Ambulatory Visit | Attending: Ophthalmology | Admitting: Ophthalmology

## 2015-01-06 DIAGNOSIS — N186 End stage renal disease: Secondary | ICD-10-CM | POA: Diagnosis not present

## 2015-01-06 DIAGNOSIS — D509 Iron deficiency anemia, unspecified: Secondary | ICD-10-CM | POA: Diagnosis not present

## 2015-01-06 DIAGNOSIS — Z992 Dependence on renal dialysis: Secondary | ICD-10-CM | POA: Diagnosis not present

## 2015-01-06 DIAGNOSIS — N2581 Secondary hyperparathyroidism of renal origin: Secondary | ICD-10-CM | POA: Diagnosis not present

## 2015-01-06 DIAGNOSIS — D631 Anemia in chronic kidney disease: Secondary | ICD-10-CM | POA: Diagnosis not present

## 2015-01-07 DIAGNOSIS — D509 Iron deficiency anemia, unspecified: Secondary | ICD-10-CM | POA: Diagnosis not present

## 2015-01-07 DIAGNOSIS — N186 End stage renal disease: Secondary | ICD-10-CM | POA: Diagnosis not present

## 2015-01-07 DIAGNOSIS — D631 Anemia in chronic kidney disease: Secondary | ICD-10-CM | POA: Diagnosis not present

## 2015-01-07 DIAGNOSIS — N2581 Secondary hyperparathyroidism of renal origin: Secondary | ICD-10-CM | POA: Diagnosis not present

## 2015-01-07 DIAGNOSIS — Z992 Dependence on renal dialysis: Secondary | ICD-10-CM | POA: Diagnosis not present

## 2015-01-08 ENCOUNTER — Ambulatory Visit (HOSPITAL_COMMUNITY): Admission: RE | Admit: 2015-01-08 | Payer: Medicare Other | Source: Ambulatory Visit | Admitting: Ophthalmology

## 2015-01-08 ENCOUNTER — Encounter (HOSPITAL_COMMUNITY): Admission: RE | Payer: Self-pay | Source: Ambulatory Visit

## 2015-01-08 SURGERY — PHACOEMULSIFICATION, CATARACT, WITH IOL INSERTION
Anesthesia: Monitor Anesthesia Care | Site: Eye | Laterality: Right

## 2015-01-10 DIAGNOSIS — N186 End stage renal disease: Secondary | ICD-10-CM | POA: Diagnosis not present

## 2015-01-10 DIAGNOSIS — N2581 Secondary hyperparathyroidism of renal origin: Secondary | ICD-10-CM | POA: Diagnosis not present

## 2015-01-10 DIAGNOSIS — Z992 Dependence on renal dialysis: Secondary | ICD-10-CM | POA: Diagnosis not present

## 2015-01-10 DIAGNOSIS — D631 Anemia in chronic kidney disease: Secondary | ICD-10-CM | POA: Diagnosis not present

## 2015-01-10 DIAGNOSIS — D509 Iron deficiency anemia, unspecified: Secondary | ICD-10-CM | POA: Diagnosis not present

## 2015-01-12 DIAGNOSIS — I10 Essential (primary) hypertension: Secondary | ICD-10-CM | POA: Diagnosis not present

## 2015-01-12 DIAGNOSIS — E1121 Type 2 diabetes mellitus with diabetic nephropathy: Secondary | ICD-10-CM | POA: Diagnosis not present

## 2015-01-12 DIAGNOSIS — N186 End stage renal disease: Secondary | ICD-10-CM | POA: Diagnosis not present

## 2015-01-12 DIAGNOSIS — I509 Heart failure, unspecified: Secondary | ICD-10-CM | POA: Diagnosis not present

## 2015-01-13 DIAGNOSIS — N186 End stage renal disease: Secondary | ICD-10-CM | POA: Diagnosis not present

## 2015-01-13 DIAGNOSIS — D509 Iron deficiency anemia, unspecified: Secondary | ICD-10-CM | POA: Diagnosis not present

## 2015-01-13 DIAGNOSIS — N2581 Secondary hyperparathyroidism of renal origin: Secondary | ICD-10-CM | POA: Diagnosis not present

## 2015-01-13 DIAGNOSIS — D631 Anemia in chronic kidney disease: Secondary | ICD-10-CM | POA: Diagnosis not present

## 2015-01-13 DIAGNOSIS — Z992 Dependence on renal dialysis: Secondary | ICD-10-CM | POA: Diagnosis not present

## 2015-01-14 DIAGNOSIS — N186 End stage renal disease: Secondary | ICD-10-CM | POA: Diagnosis not present

## 2015-01-14 DIAGNOSIS — Z992 Dependence on renal dialysis: Secondary | ICD-10-CM | POA: Diagnosis not present

## 2015-01-15 DIAGNOSIS — N186 End stage renal disease: Secondary | ICD-10-CM | POA: Diagnosis not present

## 2015-01-15 DIAGNOSIS — N2581 Secondary hyperparathyroidism of renal origin: Secondary | ICD-10-CM | POA: Diagnosis not present

## 2015-01-15 DIAGNOSIS — D631 Anemia in chronic kidney disease: Secondary | ICD-10-CM | POA: Diagnosis not present

## 2015-01-15 DIAGNOSIS — Z23 Encounter for immunization: Secondary | ICD-10-CM | POA: Diagnosis not present

## 2015-01-15 DIAGNOSIS — Z992 Dependence on renal dialysis: Secondary | ICD-10-CM | POA: Diagnosis not present

## 2015-01-15 DIAGNOSIS — D509 Iron deficiency anemia, unspecified: Secondary | ICD-10-CM | POA: Diagnosis not present

## 2015-01-17 DIAGNOSIS — Z992 Dependence on renal dialysis: Secondary | ICD-10-CM | POA: Diagnosis not present

## 2015-01-17 DIAGNOSIS — Z23 Encounter for immunization: Secondary | ICD-10-CM | POA: Diagnosis not present

## 2015-01-17 DIAGNOSIS — N2581 Secondary hyperparathyroidism of renal origin: Secondary | ICD-10-CM | POA: Diagnosis not present

## 2015-01-17 DIAGNOSIS — N186 End stage renal disease: Secondary | ICD-10-CM | POA: Diagnosis not present

## 2015-01-17 DIAGNOSIS — D509 Iron deficiency anemia, unspecified: Secondary | ICD-10-CM | POA: Diagnosis not present

## 2015-01-17 DIAGNOSIS — D631 Anemia in chronic kidney disease: Secondary | ICD-10-CM | POA: Diagnosis not present

## 2015-01-20 DIAGNOSIS — Z23 Encounter for immunization: Secondary | ICD-10-CM | POA: Diagnosis not present

## 2015-01-20 DIAGNOSIS — Z992 Dependence on renal dialysis: Secondary | ICD-10-CM | POA: Diagnosis not present

## 2015-01-20 DIAGNOSIS — N2581 Secondary hyperparathyroidism of renal origin: Secondary | ICD-10-CM | POA: Diagnosis not present

## 2015-01-20 DIAGNOSIS — N186 End stage renal disease: Secondary | ICD-10-CM | POA: Diagnosis not present

## 2015-01-20 DIAGNOSIS — D509 Iron deficiency anemia, unspecified: Secondary | ICD-10-CM | POA: Diagnosis not present

## 2015-01-20 DIAGNOSIS — D631 Anemia in chronic kidney disease: Secondary | ICD-10-CM | POA: Diagnosis not present

## 2015-01-22 ENCOUNTER — Encounter (HOSPITAL_COMMUNITY): Payer: Self-pay | Admitting: Emergency Medicine

## 2015-01-22 ENCOUNTER — Emergency Department (HOSPITAL_COMMUNITY)
Admission: EM | Admit: 2015-01-22 | Discharge: 2015-01-22 | Disposition: A | Discharge status: Home / Self Care | Payer: Medicare Other | Attending: Emergency Medicine | Admitting: Emergency Medicine

## 2015-01-22 DIAGNOSIS — Z9889 Other specified postprocedural states: Secondary | ICD-10-CM | POA: Insufficient documentation

## 2015-01-22 DIAGNOSIS — N189 Chronic kidney disease, unspecified: Secondary | ICD-10-CM | POA: Diagnosis not present

## 2015-01-22 DIAGNOSIS — Z23 Encounter for immunization: Secondary | ICD-10-CM | POA: Diagnosis not present

## 2015-01-22 DIAGNOSIS — H578 Other specified disorders of eye and adnexa: Secondary | ICD-10-CM | POA: Diagnosis not present

## 2015-01-22 DIAGNOSIS — Z87891 Personal history of nicotine dependence: Secondary | ICD-10-CM | POA: Diagnosis not present

## 2015-01-22 DIAGNOSIS — Z794 Long term (current) use of insulin: Secondary | ICD-10-CM | POA: Diagnosis not present

## 2015-01-22 DIAGNOSIS — I509 Heart failure, unspecified: Secondary | ICD-10-CM | POA: Diagnosis not present

## 2015-01-22 DIAGNOSIS — Z853 Personal history of malignant neoplasm of breast: Secondary | ICD-10-CM | POA: Insufficient documentation

## 2015-01-22 DIAGNOSIS — N2581 Secondary hyperparathyroidism of renal origin: Secondary | ICD-10-CM | POA: Diagnosis not present

## 2015-01-22 DIAGNOSIS — H04202 Unspecified epiphora, left lacrimal gland: Secondary | ICD-10-CM

## 2015-01-22 DIAGNOSIS — Z79899 Other long term (current) drug therapy: Secondary | ICD-10-CM | POA: Insufficient documentation

## 2015-01-22 DIAGNOSIS — I129 Hypertensive chronic kidney disease with stage 1 through stage 4 chronic kidney disease, or unspecified chronic kidney disease: Secondary | ICD-10-CM | POA: Insufficient documentation

## 2015-01-22 DIAGNOSIS — Z8739 Personal history of other diseases of the musculoskeletal system and connective tissue: Secondary | ICD-10-CM | POA: Insufficient documentation

## 2015-01-22 DIAGNOSIS — D509 Iron deficiency anemia, unspecified: Secondary | ICD-10-CM | POA: Diagnosis not present

## 2015-01-22 DIAGNOSIS — Z992 Dependence on renal dialysis: Secondary | ICD-10-CM | POA: Diagnosis not present

## 2015-01-22 DIAGNOSIS — S0532XA Ocular laceration without prolapse or loss of intraocular tissue, left eye, initial encounter: Secondary | ICD-10-CM | POA: Diagnosis not present

## 2015-01-22 DIAGNOSIS — N186 End stage renal disease: Secondary | ICD-10-CM | POA: Diagnosis not present

## 2015-01-22 DIAGNOSIS — Z87442 Personal history of urinary calculi: Secondary | ICD-10-CM | POA: Insufficient documentation

## 2015-01-22 DIAGNOSIS — D631 Anemia in chronic kidney disease: Secondary | ICD-10-CM | POA: Diagnosis not present

## 2015-01-22 DIAGNOSIS — H539 Unspecified visual disturbance: Secondary | ICD-10-CM | POA: Insufficient documentation

## 2015-01-22 DIAGNOSIS — I252 Old myocardial infarction: Secondary | ICD-10-CM | POA: Diagnosis not present

## 2015-01-22 DIAGNOSIS — H53149 Visual discomfort, unspecified: Secondary | ICD-10-CM | POA: Insufficient documentation

## 2015-01-22 DIAGNOSIS — E119 Type 2 diabetes mellitus without complications: Secondary | ICD-10-CM | POA: Diagnosis not present

## 2015-01-22 DIAGNOSIS — I2510 Atherosclerotic heart disease of native coronary artery without angina pectoris: Secondary | ICD-10-CM | POA: Insufficient documentation

## 2015-01-22 MED ORDER — FLUORESCEIN SODIUM 1 MG OP STRP
ORAL_STRIP | OPHTHALMIC | Status: AC
  Filled 2015-01-22: qty 1

## 2015-01-22 MED ORDER — TETRACAINE HCL 0.5 % OP SOLN
OPHTHALMIC | Status: AC
  Filled 2015-01-22: qty 2

## 2015-01-22 NOTE — ED Provider Notes (Signed)
CSN: [id number]     Arrival date & time 01/22/15  1207 History  This chart was scribed for Davonna Belling, MD by Irene Pap, ED Scribe. This patient was seen in room APA09/APA09 and patient care was started at 12:38 PM.   Chief Complaint  Patient presents with  . Eye Drainage   The history is provided by the patient. No language interpreter was used.  HPI Comments: Alexis [name] is a 69 y.o. Female with hx of HTN, CAD, CHF, MI, breast cancer, and cataract surgery who presents to the Emergency Department complaining of increased left eye drainage onset 3 days ago. Reports that her cataract surgery was one month ago and has been experiencing increased tear production which she states was not present before her surgery. She reports being prescribed eye drops by the ophthalmologist for the increased drainage and stopped taking them on 01/19/15. Reports that the drainage worsened since stopping the eye drops. States that the drainage makes it hard to see and has been experiencing photophobia. Pt denies eye pain. States that she has an appointment with her ophthalmologist on Monday.   Past Medical History  Diagnosis Date  . Hypertension   . Morbid obesity   . Coronary artery disease   . Chest pain   . Hyperlipidemia   . Hx of breast cancer   . CHF (congestive heart failure)   . SOB (shortness of breath)   . Proteinuria 01-08-13  . Dyslipidemia 01-08-13  . Thyroid disease 01-08-13    Hyper-parathyroidism-secondary  . Hx: UTI (urinary tract infection) 01-08-13  . Diabetes mellitus     Type 2  . Arthritis   . 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 Chronic kidney disease 03/2014    dialysis t/th/sa  . Diabetic nephropathy 01-08-13  . Cancer 2005     left breast   Past Surgical History  Procedure Laterality Date  . Back surgery    . Tubal ligation    . Transthoracic echocardiogram  10/01/2010     Left ventricle: The cavity size was mildly dilated. Wall thickness was increased in a pattern of  mild LVH. Systolic function was   mildly reduced. The estimated ejection fraction was in the range  of 45% to 50%.   . Arm surgery      Left arm trauma  . Mastectomy      Left  . Spine surgery    . Abdominal aortagram N/A 08/11/2014    Procedure: ABDOMINAL Maxcine Ham;  Surgeon: Angelia Mould, MD;  Location: [name] CATH LAB;  Service: Cardiovascular;  Laterality: N/A;  . Tonsillectomy    . Appendectomy    . Abdominal hysterectomy    . Dialysis catheter    . Av fistula placement Left 08/21/2014    Procedure: LEFT ARM ARTERIOVENOUS (AV) FISTULA CREATION ;  Surgeon: Angelia Mould, MD;  Location: [place];  Service: Vascular;  Laterality: Left;  . Coronary angioplasty  12/19/2003    inferior wall hypokinesis. ef 50%  . Cardiac catheterization  2010  . Cataract extraction w/phaco Left 12/22/2014    Procedure: CATARACT EXTRACTION PHACO AND INTRAOCULAR LENS PLACEMENT (IOC);  Surgeon: Tonny Branch, MD;  Location: [facility];  Service: Ophthalmology;  Laterality: Left;  CDE 13.48   Family History  Problem Relation Age of Onset  . Breast cancer Mother     Died age 43  . Cancer Mother 1    Breast  . Heart disease Mother   . Hyperlipidemia Mother   .  Hypertension Mother   . Heart attack Mother    Social History  Substance Use Topics  . Smoking status: Former Smoker -- 1.00 packs/day for 30 years    Types: Cigarettes    Quit date: 05/16/1998  . Smokeless tobacco: Never Used  . Alcohol Use: No   OB History    No data available     Review of Systems  Constitutional: Negative for fever and chills.  Eyes: Positive for photophobia, discharge and visual disturbance. Negative for pain.   Allergies  Review of patient's allergies indicates no known allergies.  Home Medications   Prior to Admission medications   Medication Sig Start Date End Date Taking? Authorizing Provider  carvedilol (COREG) 25 MG tablet Take 25 mg by mouth 2 (two) times daily with a meal.    Historical Provider, MD   docusate sodium (COLACE) 100 MG capsule Take 100 mg by mouth daily as needed for mild constipation.  03/25/14   Historical Provider, MD  hydrALAZINE (APRESOLINE) 100 MG tablet Take 100 mg by mouth 3 (three) times daily.    Historical Provider, MD  hydrOXYzine (ATARAX/VISTARIL) 25 MG tablet Take 25 mg by mouth 2 (two) times daily as needed for itching.    Historical Provider, MD  insulin aspart protamine-insulin aspart (NOVOLOG 70/30) (70-30) 100 UNIT/ML injection Inject 30-60 Units into the skin 2 (two) times daily with a meal. 60 UNITS IN THE MORNING AND 30 UNITS AT BEDTIME    Historical Provider, MD  nitroGLYCERIN (NITROSTAT) 0.4 MG SL tablet Place 0.4 mg under the tongue every 5 (five) minutes as needed for chest pain.    Historical Provider, MD  sevelamer carbonate (RENVELA) 800 MG tablet Take 800 mg by mouth 3 (three) times daily with meals.  03/25/14   Historical Provider, MD   BP 159/59 mmHg  Pulse 104  Temp(Src) 98.9 F (37.2 C)  Resp 14  Ht 5\' 6"  (1.676 m)  Wt 243 lb (110.224 kg)  BMI 39.24 kg/m2  SpO2 98%  Physical Exam  Constitutional: She is oriented to person, place, and time. She appears well-developed and well-nourished.  HENT:  Head: Normocephalic and atraumatic.  Eyes: EOM are normal.  Pupils mildly constricted on left. Reactive. No clear tearing. No anterior chamber abnormality. No tenderness to eye.  Musculoskeletal: Normal range of motion.  Neurological: She is alert and oriented to person, place, and time.  Nursing note and vitals reviewed.   ED Course  Procedures (including critical care time) DIAGNOSTIC STUDIES: Oxygen Saturation is 98% on RA, normal by my interpretation.    COORDINATION OF CARE: 12:42 PM-Discussed treatment plan which includes follow up with ophthalmologist and continuing eye drops with pt at bedside and pt agreed to plan.   Labs Review Labs Reviewed - No data to display  Imaging Review No results found.    EKG  Interpretation None      MDM   Final diagnoses:  Tearing, left    Patient with tearing after cataract surgery. Has seen ophthalmologist for it was given drops. Has follow-up in a few days. Overall benign exam. Will have follow-up with ophthalmology. I personally performed the services described in this documentation, which was scribed in my presence. The recorded information has been reviewed and is accurate.     Davonna Belling, MD 01/22/15 [phone]

## 2015-01-22 NOTE — ED Notes (Signed)
Patient with no complaints at this time. Respirations even and unlabored. Skin warm/dry. Discharge instructions reviewed with patient at this time. Patient given opportunity to voice concerns/ask questions. Patient discharged at this time and left Emergency Department with steady gait.   

## 2015-01-22 NOTE — Discharge Instructions (Signed)
Follow-up with her ophthalmologist. Return for pain fevers or much change in the vision.

## 2015-01-22 NOTE — ED Notes (Signed)
PT stated she had cataract surgery about a month ago and has issues with increased tear production to left eye affecting her vision. PT was started on new eye drops at the ophthalmologist  office and they finished on 01/19/15 and watering of eye has worsening since completion of eye drops.

## 2015-01-24 DIAGNOSIS — D509 Iron deficiency anemia, unspecified: Secondary | ICD-10-CM | POA: Diagnosis not present

## 2015-01-24 DIAGNOSIS — Z992 Dependence on renal dialysis: Secondary | ICD-10-CM | POA: Diagnosis not present

## 2015-01-24 DIAGNOSIS — Z23 Encounter for immunization: Secondary | ICD-10-CM | POA: Diagnosis not present

## 2015-01-24 DIAGNOSIS — D631 Anemia in chronic kidney disease: Secondary | ICD-10-CM | POA: Diagnosis not present

## 2015-01-24 DIAGNOSIS — N186 End stage renal disease: Secondary | ICD-10-CM | POA: Diagnosis not present

## 2015-01-24 DIAGNOSIS — N2581 Secondary hyperparathyroidism of renal origin: Secondary | ICD-10-CM | POA: Diagnosis not present

## 2015-01-27 DIAGNOSIS — N186 End stage renal disease: Secondary | ICD-10-CM | POA: Diagnosis not present

## 2015-01-27 DIAGNOSIS — D509 Iron deficiency anemia, unspecified: Secondary | ICD-10-CM | POA: Diagnosis not present

## 2015-01-27 DIAGNOSIS — D631 Anemia in chronic kidney disease: Secondary | ICD-10-CM | POA: Diagnosis not present

## 2015-01-27 DIAGNOSIS — N2581 Secondary hyperparathyroidism of renal origin: Secondary | ICD-10-CM | POA: Diagnosis not present

## 2015-01-27 DIAGNOSIS — Z23 Encounter for immunization: Secondary | ICD-10-CM | POA: Diagnosis not present

## 2015-01-27 DIAGNOSIS — Z992 Dependence on renal dialysis: Secondary | ICD-10-CM | POA: Diagnosis not present

## 2015-01-29 DIAGNOSIS — D631 Anemia in chronic kidney disease: Secondary | ICD-10-CM | POA: Diagnosis not present

## 2015-01-29 DIAGNOSIS — Z992 Dependence on renal dialysis: Secondary | ICD-10-CM | POA: Diagnosis not present

## 2015-01-29 DIAGNOSIS — Z23 Encounter for immunization: Secondary | ICD-10-CM | POA: Diagnosis not present

## 2015-01-29 DIAGNOSIS — D509 Iron deficiency anemia, unspecified: Secondary | ICD-10-CM | POA: Diagnosis not present

## 2015-01-29 DIAGNOSIS — N2581 Secondary hyperparathyroidism of renal origin: Secondary | ICD-10-CM | POA: Diagnosis not present

## 2015-01-29 DIAGNOSIS — N186 End stage renal disease: Secondary | ICD-10-CM | POA: Diagnosis not present

## 2015-01-31 DIAGNOSIS — Z992 Dependence on renal dialysis: Secondary | ICD-10-CM | POA: Diagnosis not present

## 2015-01-31 DIAGNOSIS — Z23 Encounter for immunization: Secondary | ICD-10-CM | POA: Diagnosis not present

## 2015-01-31 DIAGNOSIS — D631 Anemia in chronic kidney disease: Secondary | ICD-10-CM | POA: Diagnosis not present

## 2015-01-31 DIAGNOSIS — N186 End stage renal disease: Secondary | ICD-10-CM | POA: Diagnosis not present

## 2015-01-31 DIAGNOSIS — N2581 Secondary hyperparathyroidism of renal origin: Secondary | ICD-10-CM | POA: Diagnosis not present

## 2015-01-31 DIAGNOSIS — D509 Iron deficiency anemia, unspecified: Secondary | ICD-10-CM | POA: Diagnosis not present

## 2015-02-03 DIAGNOSIS — Z992 Dependence on renal dialysis: Secondary | ICD-10-CM | POA: Diagnosis not present

## 2015-02-03 DIAGNOSIS — N2581 Secondary hyperparathyroidism of renal origin: Secondary | ICD-10-CM | POA: Diagnosis not present

## 2015-02-03 DIAGNOSIS — N186 End stage renal disease: Secondary | ICD-10-CM | POA: Diagnosis not present

## 2015-02-03 DIAGNOSIS — D631 Anemia in chronic kidney disease: Secondary | ICD-10-CM | POA: Diagnosis not present

## 2015-02-03 DIAGNOSIS — Z23 Encounter for immunization: Secondary | ICD-10-CM | POA: Diagnosis not present

## 2015-02-03 DIAGNOSIS — D509 Iron deficiency anemia, unspecified: Secondary | ICD-10-CM | POA: Diagnosis not present

## 2015-02-05 DIAGNOSIS — Z23 Encounter for immunization: Secondary | ICD-10-CM | POA: Diagnosis not present

## 2015-02-05 DIAGNOSIS — Z992 Dependence on renal dialysis: Secondary | ICD-10-CM | POA: Diagnosis not present

## 2015-02-05 DIAGNOSIS — D631 Anemia in chronic kidney disease: Secondary | ICD-10-CM | POA: Diagnosis not present

## 2015-02-05 DIAGNOSIS — N2581 Secondary hyperparathyroidism of renal origin: Secondary | ICD-10-CM | POA: Diagnosis not present

## 2015-02-05 DIAGNOSIS — N186 End stage renal disease: Secondary | ICD-10-CM | POA: Diagnosis not present

## 2015-02-05 DIAGNOSIS — D509 Iron deficiency anemia, unspecified: Secondary | ICD-10-CM | POA: Diagnosis not present

## 2015-02-07 DIAGNOSIS — D631 Anemia in chronic kidney disease: Secondary | ICD-10-CM | POA: Diagnosis not present

## 2015-02-07 DIAGNOSIS — Z23 Encounter for immunization: Secondary | ICD-10-CM | POA: Diagnosis not present

## 2015-02-07 DIAGNOSIS — Z992 Dependence on renal dialysis: Secondary | ICD-10-CM | POA: Diagnosis not present

## 2015-02-07 DIAGNOSIS — N2581 Secondary hyperparathyroidism of renal origin: Secondary | ICD-10-CM | POA: Diagnosis not present

## 2015-02-07 DIAGNOSIS — D509 Iron deficiency anemia, unspecified: Secondary | ICD-10-CM | POA: Diagnosis not present

## 2015-02-07 DIAGNOSIS — N186 End stage renal disease: Secondary | ICD-10-CM | POA: Diagnosis not present

## 2015-02-10 DIAGNOSIS — Z23 Encounter for immunization: Secondary | ICD-10-CM | POA: Diagnosis not present

## 2015-02-10 DIAGNOSIS — N2581 Secondary hyperparathyroidism of renal origin: Secondary | ICD-10-CM | POA: Diagnosis not present

## 2015-02-10 DIAGNOSIS — E1142 Type 2 diabetes mellitus with diabetic polyneuropathy: Secondary | ICD-10-CM | POA: Diagnosis not present

## 2015-02-10 DIAGNOSIS — E119 Type 2 diabetes mellitus without complications: Secondary | ICD-10-CM | POA: Diagnosis not present

## 2015-02-10 DIAGNOSIS — B351 Tinea unguium: Secondary | ICD-10-CM | POA: Diagnosis not present

## 2015-02-10 DIAGNOSIS — D509 Iron deficiency anemia, unspecified: Secondary | ICD-10-CM | POA: Diagnosis not present

## 2015-02-10 DIAGNOSIS — Z794 Long term (current) use of insulin: Secondary | ICD-10-CM | POA: Diagnosis not present

## 2015-02-10 DIAGNOSIS — N186 End stage renal disease: Secondary | ICD-10-CM | POA: Diagnosis not present

## 2015-02-10 DIAGNOSIS — L84 Corns and callosities: Secondary | ICD-10-CM | POA: Diagnosis not present

## 2015-02-10 DIAGNOSIS — D631 Anemia in chronic kidney disease: Secondary | ICD-10-CM | POA: Diagnosis not present

## 2015-02-10 DIAGNOSIS — Z992 Dependence on renal dialysis: Secondary | ICD-10-CM | POA: Diagnosis not present

## 2015-02-12 DIAGNOSIS — N186 End stage renal disease: Secondary | ICD-10-CM | POA: Diagnosis not present

## 2015-02-12 DIAGNOSIS — D631 Anemia in chronic kidney disease: Secondary | ICD-10-CM | POA: Diagnosis not present

## 2015-02-12 DIAGNOSIS — Z992 Dependence on renal dialysis: Secondary | ICD-10-CM | POA: Diagnosis not present

## 2015-02-12 DIAGNOSIS — D509 Iron deficiency anemia, unspecified: Secondary | ICD-10-CM | POA: Diagnosis not present

## 2015-02-12 DIAGNOSIS — Z23 Encounter for immunization: Secondary | ICD-10-CM | POA: Diagnosis not present

## 2015-02-12 DIAGNOSIS — N2581 Secondary hyperparathyroidism of renal origin: Secondary | ICD-10-CM | POA: Diagnosis not present

## 2015-02-13 DIAGNOSIS — N186 End stage renal disease: Secondary | ICD-10-CM | POA: Diagnosis not present

## 2015-02-13 DIAGNOSIS — Z992 Dependence on renal dialysis: Secondary | ICD-10-CM | POA: Diagnosis not present

## 2015-02-14 DIAGNOSIS — D631 Anemia in chronic kidney disease: Secondary | ICD-10-CM | POA: Diagnosis not present

## 2015-02-14 DIAGNOSIS — N186 End stage renal disease: Secondary | ICD-10-CM | POA: Diagnosis not present

## 2015-02-14 DIAGNOSIS — N2581 Secondary hyperparathyroidism of renal origin: Secondary | ICD-10-CM | POA: Diagnosis not present

## 2015-02-14 DIAGNOSIS — D509 Iron deficiency anemia, unspecified: Secondary | ICD-10-CM | POA: Diagnosis not present

## 2015-02-14 DIAGNOSIS — Z992 Dependence on renal dialysis: Secondary | ICD-10-CM | POA: Diagnosis not present

## 2015-02-17 DIAGNOSIS — D509 Iron deficiency anemia, unspecified: Secondary | ICD-10-CM | POA: Diagnosis not present

## 2015-02-17 DIAGNOSIS — Z992 Dependence on renal dialysis: Secondary | ICD-10-CM | POA: Diagnosis not present

## 2015-02-17 DIAGNOSIS — N2581 Secondary hyperparathyroidism of renal origin: Secondary | ICD-10-CM | POA: Diagnosis not present

## 2015-02-17 DIAGNOSIS — D631 Anemia in chronic kidney disease: Secondary | ICD-10-CM | POA: Diagnosis not present

## 2015-02-17 DIAGNOSIS — N186 End stage renal disease: Secondary | ICD-10-CM | POA: Diagnosis not present

## 2015-02-19 DIAGNOSIS — D631 Anemia in chronic kidney disease: Secondary | ICD-10-CM | POA: Diagnosis not present

## 2015-02-19 DIAGNOSIS — D509 Iron deficiency anemia, unspecified: Secondary | ICD-10-CM | POA: Diagnosis not present

## 2015-02-19 DIAGNOSIS — N186 End stage renal disease: Secondary | ICD-10-CM | POA: Diagnosis not present

## 2015-02-19 DIAGNOSIS — Z992 Dependence on renal dialysis: Secondary | ICD-10-CM | POA: Diagnosis not present

## 2015-02-19 DIAGNOSIS — N2581 Secondary hyperparathyroidism of renal origin: Secondary | ICD-10-CM | POA: Diagnosis not present

## 2015-02-20 DIAGNOSIS — D631 Anemia in chronic kidney disease: Secondary | ICD-10-CM | POA: Diagnosis not present

## 2015-02-20 DIAGNOSIS — N2581 Secondary hyperparathyroidism of renal origin: Secondary | ICD-10-CM | POA: Diagnosis not present

## 2015-02-20 DIAGNOSIS — D509 Iron deficiency anemia, unspecified: Secondary | ICD-10-CM | POA: Diagnosis not present

## 2015-02-20 DIAGNOSIS — N186 End stage renal disease: Secondary | ICD-10-CM | POA: Diagnosis not present

## 2015-02-20 DIAGNOSIS — Z992 Dependence on renal dialysis: Secondary | ICD-10-CM | POA: Diagnosis not present

## 2015-02-23 DIAGNOSIS — N2581 Secondary hyperparathyroidism of renal origin: Secondary | ICD-10-CM | POA: Diagnosis not present

## 2015-02-23 DIAGNOSIS — N186 End stage renal disease: Secondary | ICD-10-CM | POA: Diagnosis not present

## 2015-02-23 DIAGNOSIS — D631 Anemia in chronic kidney disease: Secondary | ICD-10-CM | POA: Diagnosis not present

## 2015-02-23 DIAGNOSIS — Z992 Dependence on renal dialysis: Secondary | ICD-10-CM | POA: Diagnosis not present

## 2015-02-23 DIAGNOSIS — D509 Iron deficiency anemia, unspecified: Secondary | ICD-10-CM | POA: Diagnosis not present

## 2015-02-26 DIAGNOSIS — D631 Anemia in chronic kidney disease: Secondary | ICD-10-CM | POA: Diagnosis not present

## 2015-02-26 DIAGNOSIS — D509 Iron deficiency anemia, unspecified: Secondary | ICD-10-CM | POA: Diagnosis not present

## 2015-02-26 DIAGNOSIS — N186 End stage renal disease: Secondary | ICD-10-CM | POA: Diagnosis not present

## 2015-02-26 DIAGNOSIS — N2581 Secondary hyperparathyroidism of renal origin: Secondary | ICD-10-CM | POA: Diagnosis not present

## 2015-02-26 DIAGNOSIS — Z992 Dependence on renal dialysis: Secondary | ICD-10-CM | POA: Diagnosis not present

## 2015-02-28 DIAGNOSIS — N2581 Secondary hyperparathyroidism of renal origin: Secondary | ICD-10-CM | POA: Diagnosis not present

## 2015-02-28 DIAGNOSIS — D509 Iron deficiency anemia, unspecified: Secondary | ICD-10-CM | POA: Diagnosis not present

## 2015-02-28 DIAGNOSIS — Z992 Dependence on renal dialysis: Secondary | ICD-10-CM | POA: Diagnosis not present

## 2015-02-28 DIAGNOSIS — N186 End stage renal disease: Secondary | ICD-10-CM | POA: Diagnosis not present

## 2015-02-28 DIAGNOSIS — D631 Anemia in chronic kidney disease: Secondary | ICD-10-CM | POA: Diagnosis not present

## 2015-03-03 DIAGNOSIS — D509 Iron deficiency anemia, unspecified: Secondary | ICD-10-CM | POA: Diagnosis not present

## 2015-03-03 DIAGNOSIS — Z992 Dependence on renal dialysis: Secondary | ICD-10-CM | POA: Diagnosis not present

## 2015-03-03 DIAGNOSIS — D631 Anemia in chronic kidney disease: Secondary | ICD-10-CM | POA: Diagnosis not present

## 2015-03-03 DIAGNOSIS — N2581 Secondary hyperparathyroidism of renal origin: Secondary | ICD-10-CM | POA: Diagnosis not present

## 2015-03-03 DIAGNOSIS — N186 End stage renal disease: Secondary | ICD-10-CM | POA: Diagnosis not present

## 2015-03-05 DIAGNOSIS — D509 Iron deficiency anemia, unspecified: Secondary | ICD-10-CM | POA: Diagnosis not present

## 2015-03-05 DIAGNOSIS — N2581 Secondary hyperparathyroidism of renal origin: Secondary | ICD-10-CM | POA: Diagnosis not present

## 2015-03-05 DIAGNOSIS — Z992 Dependence on renal dialysis: Secondary | ICD-10-CM | POA: Diagnosis not present

## 2015-03-05 DIAGNOSIS — N186 End stage renal disease: Secondary | ICD-10-CM | POA: Diagnosis not present

## 2015-03-05 DIAGNOSIS — D631 Anemia in chronic kidney disease: Secondary | ICD-10-CM | POA: Diagnosis not present

## 2015-03-07 DIAGNOSIS — D631 Anemia in chronic kidney disease: Secondary | ICD-10-CM | POA: Diagnosis not present

## 2015-03-07 DIAGNOSIS — N186 End stage renal disease: Secondary | ICD-10-CM | POA: Diagnosis not present

## 2015-03-07 DIAGNOSIS — D509 Iron deficiency anemia, unspecified: Secondary | ICD-10-CM | POA: Diagnosis not present

## 2015-03-07 DIAGNOSIS — N2581 Secondary hyperparathyroidism of renal origin: Secondary | ICD-10-CM | POA: Diagnosis not present

## 2015-03-07 DIAGNOSIS — Z992 Dependence on renal dialysis: Secondary | ICD-10-CM | POA: Diagnosis not present

## 2015-03-10 DIAGNOSIS — N186 End stage renal disease: Secondary | ICD-10-CM | POA: Diagnosis not present

## 2015-03-10 DIAGNOSIS — Z992 Dependence on renal dialysis: Secondary | ICD-10-CM | POA: Diagnosis not present

## 2015-03-10 DIAGNOSIS — D631 Anemia in chronic kidney disease: Secondary | ICD-10-CM | POA: Diagnosis not present

## 2015-03-10 DIAGNOSIS — D509 Iron deficiency anemia, unspecified: Secondary | ICD-10-CM | POA: Diagnosis not present

## 2015-03-10 DIAGNOSIS — N2581 Secondary hyperparathyroidism of renal origin: Secondary | ICD-10-CM | POA: Diagnosis not present

## 2015-03-12 DIAGNOSIS — Z992 Dependence on renal dialysis: Secondary | ICD-10-CM | POA: Diagnosis not present

## 2015-03-12 DIAGNOSIS — N2581 Secondary hyperparathyroidism of renal origin: Secondary | ICD-10-CM | POA: Diagnosis not present

## 2015-03-12 DIAGNOSIS — D509 Iron deficiency anemia, unspecified: Secondary | ICD-10-CM | POA: Diagnosis not present

## 2015-03-12 DIAGNOSIS — N186 End stage renal disease: Secondary | ICD-10-CM | POA: Diagnosis not present

## 2015-03-12 DIAGNOSIS — D631 Anemia in chronic kidney disease: Secondary | ICD-10-CM | POA: Diagnosis not present

## 2015-03-14 DIAGNOSIS — N186 End stage renal disease: Secondary | ICD-10-CM | POA: Diagnosis not present

## 2015-03-14 DIAGNOSIS — N2581 Secondary hyperparathyroidism of renal origin: Secondary | ICD-10-CM | POA: Diagnosis not present

## 2015-03-14 DIAGNOSIS — D509 Iron deficiency anemia, unspecified: Secondary | ICD-10-CM | POA: Diagnosis not present

## 2015-03-14 DIAGNOSIS — D631 Anemia in chronic kidney disease: Secondary | ICD-10-CM | POA: Diagnosis not present

## 2015-03-14 DIAGNOSIS — Z992 Dependence on renal dialysis: Secondary | ICD-10-CM | POA: Diagnosis not present

## 2015-03-16 DIAGNOSIS — Z992 Dependence on renal dialysis: Secondary | ICD-10-CM | POA: Diagnosis not present

## 2015-03-16 DIAGNOSIS — N186 End stage renal disease: Secondary | ICD-10-CM | POA: Diagnosis not present

## 2015-03-17 DIAGNOSIS — N2581 Secondary hyperparathyroidism of renal origin: Secondary | ICD-10-CM | POA: Diagnosis not present

## 2015-03-17 DIAGNOSIS — D631 Anemia in chronic kidney disease: Secondary | ICD-10-CM | POA: Diagnosis not present

## 2015-03-17 DIAGNOSIS — N186 End stage renal disease: Secondary | ICD-10-CM | POA: Diagnosis not present

## 2015-03-17 DIAGNOSIS — D509 Iron deficiency anemia, unspecified: Secondary | ICD-10-CM | POA: Diagnosis not present

## 2015-03-17 DIAGNOSIS — Z992 Dependence on renal dialysis: Secondary | ICD-10-CM | POA: Diagnosis not present

## 2015-03-18 DIAGNOSIS — E1121 Type 2 diabetes mellitus with diabetic nephropathy: Secondary | ICD-10-CM | POA: Diagnosis not present

## 2015-03-18 DIAGNOSIS — I10 Essential (primary) hypertension: Secondary | ICD-10-CM | POA: Diagnosis not present

## 2015-03-18 DIAGNOSIS — I509 Heart failure, unspecified: Secondary | ICD-10-CM | POA: Diagnosis not present

## 2015-03-18 DIAGNOSIS — S98211A Complete traumatic amputation of two or more right lesser toes, initial encounter: Secondary | ICD-10-CM | POA: Diagnosis not present

## 2015-03-18 DIAGNOSIS — N186 End stage renal disease: Secondary | ICD-10-CM | POA: Diagnosis not present

## 2015-03-19 DIAGNOSIS — D631 Anemia in chronic kidney disease: Secondary | ICD-10-CM | POA: Diagnosis not present

## 2015-03-19 DIAGNOSIS — N186 End stage renal disease: Secondary | ICD-10-CM | POA: Diagnosis not present

## 2015-03-19 DIAGNOSIS — N2581 Secondary hyperparathyroidism of renal origin: Secondary | ICD-10-CM | POA: Diagnosis not present

## 2015-03-19 DIAGNOSIS — Z992 Dependence on renal dialysis: Secondary | ICD-10-CM | POA: Diagnosis not present

## 2015-03-19 DIAGNOSIS — D509 Iron deficiency anemia, unspecified: Secondary | ICD-10-CM | POA: Diagnosis not present

## 2015-03-21 DIAGNOSIS — D509 Iron deficiency anemia, unspecified: Secondary | ICD-10-CM | POA: Diagnosis not present

## 2015-03-21 DIAGNOSIS — D631 Anemia in chronic kidney disease: Secondary | ICD-10-CM | POA: Diagnosis not present

## 2015-03-21 DIAGNOSIS — N2581 Secondary hyperparathyroidism of renal origin: Secondary | ICD-10-CM | POA: Diagnosis not present

## 2015-03-21 DIAGNOSIS — Z992 Dependence on renal dialysis: Secondary | ICD-10-CM | POA: Diagnosis not present

## 2015-03-21 DIAGNOSIS — N186 End stage renal disease: Secondary | ICD-10-CM | POA: Diagnosis not present

## 2015-03-24 DIAGNOSIS — D631 Anemia in chronic kidney disease: Secondary | ICD-10-CM | POA: Diagnosis not present

## 2015-03-24 DIAGNOSIS — N186 End stage renal disease: Secondary | ICD-10-CM | POA: Diagnosis not present

## 2015-03-24 DIAGNOSIS — Z992 Dependence on renal dialysis: Secondary | ICD-10-CM | POA: Diagnosis not present

## 2015-03-24 DIAGNOSIS — N2581 Secondary hyperparathyroidism of renal origin: Secondary | ICD-10-CM | POA: Diagnosis not present

## 2015-03-24 DIAGNOSIS — D509 Iron deficiency anemia, unspecified: Secondary | ICD-10-CM | POA: Diagnosis not present

## 2015-03-26 DIAGNOSIS — N2581 Secondary hyperparathyroidism of renal origin: Secondary | ICD-10-CM | POA: Diagnosis not present

## 2015-03-26 DIAGNOSIS — D631 Anemia in chronic kidney disease: Secondary | ICD-10-CM | POA: Diagnosis not present

## 2015-03-26 DIAGNOSIS — N186 End stage renal disease: Secondary | ICD-10-CM | POA: Diagnosis not present

## 2015-03-26 DIAGNOSIS — D509 Iron deficiency anemia, unspecified: Secondary | ICD-10-CM | POA: Diagnosis not present

## 2015-03-26 DIAGNOSIS — Z992 Dependence on renal dialysis: Secondary | ICD-10-CM | POA: Diagnosis not present

## 2015-03-28 DIAGNOSIS — Z992 Dependence on renal dialysis: Secondary | ICD-10-CM | POA: Diagnosis not present

## 2015-03-28 DIAGNOSIS — D509 Iron deficiency anemia, unspecified: Secondary | ICD-10-CM | POA: Diagnosis not present

## 2015-03-28 DIAGNOSIS — D631 Anemia in chronic kidney disease: Secondary | ICD-10-CM | POA: Diagnosis not present

## 2015-03-28 DIAGNOSIS — N186 End stage renal disease: Secondary | ICD-10-CM | POA: Diagnosis not present

## 2015-03-28 DIAGNOSIS — N2581 Secondary hyperparathyroidism of renal origin: Secondary | ICD-10-CM | POA: Diagnosis not present

## 2015-03-31 DIAGNOSIS — D509 Iron deficiency anemia, unspecified: Secondary | ICD-10-CM | POA: Diagnosis not present

## 2015-03-31 DIAGNOSIS — N2581 Secondary hyperparathyroidism of renal origin: Secondary | ICD-10-CM | POA: Diagnosis not present

## 2015-03-31 DIAGNOSIS — N186 End stage renal disease: Secondary | ICD-10-CM | POA: Diagnosis not present

## 2015-03-31 DIAGNOSIS — Z992 Dependence on renal dialysis: Secondary | ICD-10-CM | POA: Diagnosis not present

## 2015-03-31 DIAGNOSIS — D631 Anemia in chronic kidney disease: Secondary | ICD-10-CM | POA: Diagnosis not present

## 2015-04-02 DIAGNOSIS — Z992 Dependence on renal dialysis: Secondary | ICD-10-CM | POA: Diagnosis not present

## 2015-04-02 DIAGNOSIS — N186 End stage renal disease: Secondary | ICD-10-CM | POA: Diagnosis not present

## 2015-04-02 DIAGNOSIS — D509 Iron deficiency anemia, unspecified: Secondary | ICD-10-CM | POA: Diagnosis not present

## 2015-04-02 DIAGNOSIS — D631 Anemia in chronic kidney disease: Secondary | ICD-10-CM | POA: Diagnosis not present

## 2015-04-02 DIAGNOSIS — N2581 Secondary hyperparathyroidism of renal origin: Secondary | ICD-10-CM | POA: Diagnosis not present

## 2015-04-04 DIAGNOSIS — N186 End stage renal disease: Secondary | ICD-10-CM | POA: Diagnosis not present

## 2015-04-04 DIAGNOSIS — Z992 Dependence on renal dialysis: Secondary | ICD-10-CM | POA: Diagnosis not present

## 2015-04-04 DIAGNOSIS — D509 Iron deficiency anemia, unspecified: Secondary | ICD-10-CM | POA: Diagnosis not present

## 2015-04-04 DIAGNOSIS — D631 Anemia in chronic kidney disease: Secondary | ICD-10-CM | POA: Diagnosis not present

## 2015-04-04 DIAGNOSIS — N2581 Secondary hyperparathyroidism of renal origin: Secondary | ICD-10-CM | POA: Diagnosis not present

## 2015-04-07 DIAGNOSIS — N2581 Secondary hyperparathyroidism of renal origin: Secondary | ICD-10-CM | POA: Diagnosis not present

## 2015-04-07 DIAGNOSIS — D631 Anemia in chronic kidney disease: Secondary | ICD-10-CM | POA: Diagnosis not present

## 2015-04-07 DIAGNOSIS — Z992 Dependence on renal dialysis: Secondary | ICD-10-CM | POA: Diagnosis not present

## 2015-04-07 DIAGNOSIS — D509 Iron deficiency anemia, unspecified: Secondary | ICD-10-CM | POA: Diagnosis not present

## 2015-04-07 DIAGNOSIS — N186 End stage renal disease: Secondary | ICD-10-CM | POA: Diagnosis not present

## 2015-04-09 DIAGNOSIS — N2581 Secondary hyperparathyroidism of renal origin: Secondary | ICD-10-CM | POA: Diagnosis not present

## 2015-04-09 DIAGNOSIS — Z992 Dependence on renal dialysis: Secondary | ICD-10-CM | POA: Diagnosis not present

## 2015-04-09 DIAGNOSIS — D631 Anemia in chronic kidney disease: Secondary | ICD-10-CM | POA: Diagnosis not present

## 2015-04-09 DIAGNOSIS — N186 End stage renal disease: Secondary | ICD-10-CM | POA: Diagnosis not present

## 2015-04-09 DIAGNOSIS — D509 Iron deficiency anemia, unspecified: Secondary | ICD-10-CM | POA: Diagnosis not present

## 2015-04-11 DIAGNOSIS — D631 Anemia in chronic kidney disease: Secondary | ICD-10-CM | POA: Diagnosis not present

## 2015-04-11 DIAGNOSIS — D509 Iron deficiency anemia, unspecified: Secondary | ICD-10-CM | POA: Diagnosis not present

## 2015-04-11 DIAGNOSIS — Z992 Dependence on renal dialysis: Secondary | ICD-10-CM | POA: Diagnosis not present

## 2015-04-11 DIAGNOSIS — N186 End stage renal disease: Secondary | ICD-10-CM | POA: Diagnosis not present

## 2015-04-11 DIAGNOSIS — N2581 Secondary hyperparathyroidism of renal origin: Secondary | ICD-10-CM | POA: Diagnosis not present

## 2015-04-14 DIAGNOSIS — N2581 Secondary hyperparathyroidism of renal origin: Secondary | ICD-10-CM | POA: Diagnosis not present

## 2015-04-14 DIAGNOSIS — N186 End stage renal disease: Secondary | ICD-10-CM | POA: Diagnosis not present

## 2015-04-14 DIAGNOSIS — D509 Iron deficiency anemia, unspecified: Secondary | ICD-10-CM | POA: Diagnosis not present

## 2015-04-14 DIAGNOSIS — Z992 Dependence on renal dialysis: Secondary | ICD-10-CM | POA: Diagnosis not present

## 2015-04-14 DIAGNOSIS — D631 Anemia in chronic kidney disease: Secondary | ICD-10-CM | POA: Diagnosis not present

## 2015-04-15 DIAGNOSIS — N186 End stage renal disease: Secondary | ICD-10-CM | POA: Diagnosis not present

## 2015-04-15 DIAGNOSIS — Z992 Dependence on renal dialysis: Secondary | ICD-10-CM | POA: Diagnosis not present

## 2015-04-16 DIAGNOSIS — D509 Iron deficiency anemia, unspecified: Secondary | ICD-10-CM | POA: Diagnosis not present

## 2015-04-16 DIAGNOSIS — D631 Anemia in chronic kidney disease: Secondary | ICD-10-CM | POA: Diagnosis not present

## 2015-04-16 DIAGNOSIS — Z992 Dependence on renal dialysis: Secondary | ICD-10-CM | POA: Diagnosis not present

## 2015-04-16 DIAGNOSIS — N186 End stage renal disease: Secondary | ICD-10-CM | POA: Diagnosis not present

## 2015-04-16 DIAGNOSIS — N2581 Secondary hyperparathyroidism of renal origin: Secondary | ICD-10-CM | POA: Diagnosis not present

## 2015-04-18 DIAGNOSIS — Z992 Dependence on renal dialysis: Secondary | ICD-10-CM | POA: Diagnosis not present

## 2015-04-18 DIAGNOSIS — D509 Iron deficiency anemia, unspecified: Secondary | ICD-10-CM | POA: Diagnosis not present

## 2015-04-18 DIAGNOSIS — N186 End stage renal disease: Secondary | ICD-10-CM | POA: Diagnosis not present

## 2015-04-18 DIAGNOSIS — N2581 Secondary hyperparathyroidism of renal origin: Secondary | ICD-10-CM | POA: Diagnosis not present

## 2015-04-18 DIAGNOSIS — D631 Anemia in chronic kidney disease: Secondary | ICD-10-CM | POA: Diagnosis not present

## 2015-04-21 DIAGNOSIS — N186 End stage renal disease: Secondary | ICD-10-CM | POA: Diagnosis not present

## 2015-04-21 DIAGNOSIS — D509 Iron deficiency anemia, unspecified: Secondary | ICD-10-CM | POA: Diagnosis not present

## 2015-04-21 DIAGNOSIS — N2581 Secondary hyperparathyroidism of renal origin: Secondary | ICD-10-CM | POA: Diagnosis not present

## 2015-04-21 DIAGNOSIS — D631 Anemia in chronic kidney disease: Secondary | ICD-10-CM | POA: Diagnosis not present

## 2015-04-21 DIAGNOSIS — Z992 Dependence on renal dialysis: Secondary | ICD-10-CM | POA: Diagnosis not present

## 2015-04-23 DIAGNOSIS — D631 Anemia in chronic kidney disease: Secondary | ICD-10-CM | POA: Diagnosis not present

## 2015-04-23 DIAGNOSIS — Z992 Dependence on renal dialysis: Secondary | ICD-10-CM | POA: Diagnosis not present

## 2015-04-23 DIAGNOSIS — N2581 Secondary hyperparathyroidism of renal origin: Secondary | ICD-10-CM | POA: Diagnosis not present

## 2015-04-23 DIAGNOSIS — N186 End stage renal disease: Secondary | ICD-10-CM | POA: Diagnosis not present

## 2015-04-23 DIAGNOSIS — D509 Iron deficiency anemia, unspecified: Secondary | ICD-10-CM | POA: Diagnosis not present

## 2015-04-25 DIAGNOSIS — N186 End stage renal disease: Secondary | ICD-10-CM | POA: Diagnosis not present

## 2015-04-25 DIAGNOSIS — Z992 Dependence on renal dialysis: Secondary | ICD-10-CM | POA: Diagnosis not present

## 2015-04-25 DIAGNOSIS — D509 Iron deficiency anemia, unspecified: Secondary | ICD-10-CM | POA: Diagnosis not present

## 2015-04-25 DIAGNOSIS — D631 Anemia in chronic kidney disease: Secondary | ICD-10-CM | POA: Diagnosis not present

## 2015-04-25 DIAGNOSIS — N2581 Secondary hyperparathyroidism of renal origin: Secondary | ICD-10-CM | POA: Diagnosis not present

## 2015-04-28 DIAGNOSIS — D509 Iron deficiency anemia, unspecified: Secondary | ICD-10-CM | POA: Diagnosis not present

## 2015-04-28 DIAGNOSIS — N2581 Secondary hyperparathyroidism of renal origin: Secondary | ICD-10-CM | POA: Diagnosis not present

## 2015-04-28 DIAGNOSIS — D631 Anemia in chronic kidney disease: Secondary | ICD-10-CM | POA: Diagnosis not present

## 2015-04-28 DIAGNOSIS — N186 End stage renal disease: Secondary | ICD-10-CM | POA: Diagnosis not present

## 2015-04-28 DIAGNOSIS — Z992 Dependence on renal dialysis: Secondary | ICD-10-CM | POA: Diagnosis not present

## 2015-04-30 DIAGNOSIS — Z992 Dependence on renal dialysis: Secondary | ICD-10-CM | POA: Diagnosis not present

## 2015-04-30 DIAGNOSIS — N2581 Secondary hyperparathyroidism of renal origin: Secondary | ICD-10-CM | POA: Diagnosis not present

## 2015-04-30 DIAGNOSIS — D509 Iron deficiency anemia, unspecified: Secondary | ICD-10-CM | POA: Diagnosis not present

## 2015-04-30 DIAGNOSIS — N186 End stage renal disease: Secondary | ICD-10-CM | POA: Diagnosis not present

## 2015-04-30 DIAGNOSIS — D631 Anemia in chronic kidney disease: Secondary | ICD-10-CM | POA: Diagnosis not present

## 2015-05-02 DIAGNOSIS — N2581 Secondary hyperparathyroidism of renal origin: Secondary | ICD-10-CM | POA: Diagnosis not present

## 2015-05-02 DIAGNOSIS — N186 End stage renal disease: Secondary | ICD-10-CM | POA: Diagnosis not present

## 2015-05-02 DIAGNOSIS — D631 Anemia in chronic kidney disease: Secondary | ICD-10-CM | POA: Diagnosis not present

## 2015-05-02 DIAGNOSIS — D509 Iron deficiency anemia, unspecified: Secondary | ICD-10-CM | POA: Diagnosis not present

## 2015-05-02 DIAGNOSIS — Z992 Dependence on renal dialysis: Secondary | ICD-10-CM | POA: Diagnosis not present

## 2015-05-05 DIAGNOSIS — D631 Anemia in chronic kidney disease: Secondary | ICD-10-CM | POA: Diagnosis not present

## 2015-05-05 DIAGNOSIS — N186 End stage renal disease: Secondary | ICD-10-CM | POA: Diagnosis not present

## 2015-05-05 DIAGNOSIS — Z794 Long term (current) use of insulin: Secondary | ICD-10-CM | POA: Diagnosis not present

## 2015-05-05 DIAGNOSIS — D509 Iron deficiency anemia, unspecified: Secondary | ICD-10-CM | POA: Diagnosis not present

## 2015-05-05 DIAGNOSIS — Z992 Dependence on renal dialysis: Secondary | ICD-10-CM | POA: Diagnosis not present

## 2015-05-05 DIAGNOSIS — B351 Tinea unguium: Secondary | ICD-10-CM | POA: Diagnosis not present

## 2015-05-05 DIAGNOSIS — N2581 Secondary hyperparathyroidism of renal origin: Secondary | ICD-10-CM | POA: Diagnosis not present

## 2015-05-05 DIAGNOSIS — E119 Type 2 diabetes mellitus without complications: Secondary | ICD-10-CM | POA: Diagnosis not present

## 2015-05-05 DIAGNOSIS — L84 Corns and callosities: Secondary | ICD-10-CM | POA: Diagnosis not present

## 2015-05-05 DIAGNOSIS — E1142 Type 2 diabetes mellitus with diabetic polyneuropathy: Secondary | ICD-10-CM | POA: Diagnosis not present

## 2015-05-07 DIAGNOSIS — N2581 Secondary hyperparathyroidism of renal origin: Secondary | ICD-10-CM | POA: Diagnosis not present

## 2015-05-07 DIAGNOSIS — D509 Iron deficiency anemia, unspecified: Secondary | ICD-10-CM | POA: Diagnosis not present

## 2015-05-07 DIAGNOSIS — D631 Anemia in chronic kidney disease: Secondary | ICD-10-CM | POA: Diagnosis not present

## 2015-05-07 DIAGNOSIS — Z992 Dependence on renal dialysis: Secondary | ICD-10-CM | POA: Diagnosis not present

## 2015-05-07 DIAGNOSIS — N186 End stage renal disease: Secondary | ICD-10-CM | POA: Diagnosis not present

## 2015-05-09 DIAGNOSIS — D509 Iron deficiency anemia, unspecified: Secondary | ICD-10-CM | POA: Diagnosis not present

## 2015-05-09 DIAGNOSIS — D631 Anemia in chronic kidney disease: Secondary | ICD-10-CM | POA: Diagnosis not present

## 2015-05-09 DIAGNOSIS — Z992 Dependence on renal dialysis: Secondary | ICD-10-CM | POA: Diagnosis not present

## 2015-05-09 DIAGNOSIS — N186 End stage renal disease: Secondary | ICD-10-CM | POA: Diagnosis not present

## 2015-05-09 DIAGNOSIS — N2581 Secondary hyperparathyroidism of renal origin: Secondary | ICD-10-CM | POA: Diagnosis not present

## 2015-05-12 DIAGNOSIS — D509 Iron deficiency anemia, unspecified: Secondary | ICD-10-CM | POA: Diagnosis not present

## 2015-05-12 DIAGNOSIS — N186 End stage renal disease: Secondary | ICD-10-CM | POA: Diagnosis not present

## 2015-05-12 DIAGNOSIS — N2581 Secondary hyperparathyroidism of renal origin: Secondary | ICD-10-CM | POA: Diagnosis not present

## 2015-05-12 DIAGNOSIS — Z992 Dependence on renal dialysis: Secondary | ICD-10-CM | POA: Diagnosis not present

## 2015-05-12 DIAGNOSIS — D631 Anemia in chronic kidney disease: Secondary | ICD-10-CM | POA: Diagnosis not present

## 2015-05-14 DIAGNOSIS — D631 Anemia in chronic kidney disease: Secondary | ICD-10-CM | POA: Diagnosis not present

## 2015-05-14 DIAGNOSIS — N186 End stage renal disease: Secondary | ICD-10-CM | POA: Diagnosis not present

## 2015-05-14 DIAGNOSIS — N2581 Secondary hyperparathyroidism of renal origin: Secondary | ICD-10-CM | POA: Diagnosis not present

## 2015-05-14 DIAGNOSIS — Z992 Dependence on renal dialysis: Secondary | ICD-10-CM | POA: Diagnosis not present

## 2015-05-14 DIAGNOSIS — D509 Iron deficiency anemia, unspecified: Secondary | ICD-10-CM | POA: Diagnosis not present

## 2015-05-16 DIAGNOSIS — N186 End stage renal disease: Secondary | ICD-10-CM | POA: Diagnosis not present

## 2015-05-16 DIAGNOSIS — D509 Iron deficiency anemia, unspecified: Secondary | ICD-10-CM | POA: Diagnosis not present

## 2015-05-16 DIAGNOSIS — Z992 Dependence on renal dialysis: Secondary | ICD-10-CM | POA: Diagnosis not present

## 2015-05-16 DIAGNOSIS — N2581 Secondary hyperparathyroidism of renal origin: Secondary | ICD-10-CM | POA: Diagnosis not present

## 2015-05-16 DIAGNOSIS — D631 Anemia in chronic kidney disease: Secondary | ICD-10-CM | POA: Diagnosis not present

## 2015-09-15 ENCOUNTER — Other Ambulatory Visit: Payer: Self-pay

## 2015-09-15 NOTE — Progress Notes (Signed)
Request rec'd through [facility] for a Fistulogram; reported pt. C/o pain in left arm access during and post treatment.  Fistulogram scheduled for 09/16/15 @ 1:00 PM.  Will fax patient instructions to [facility].

## 2015-09-16 ENCOUNTER — Telehealth: Payer: Self-pay | Admitting: Vascular Surgery

## 2015-09-16 ENCOUNTER — Ambulatory Visit (HOSPITAL_COMMUNITY)
Admission: RE | Admit: 2015-09-16 | Discharge: 2015-09-16 | Disposition: A | Discharge status: Home / Self Care | Payer: Medicare Other | Source: Ambulatory Visit | Attending: Vascular Surgery | Admitting: Vascular Surgery

## 2015-09-16 ENCOUNTER — Encounter (HOSPITAL_COMMUNITY)
Admission: RE | Disposition: A | Discharge status: Home / Self Care | Payer: Self-pay | Source: Ambulatory Visit | Attending: Vascular Surgery

## 2015-09-16 DIAGNOSIS — N186 End stage renal disease: Secondary | ICD-10-CM | POA: Diagnosis not present

## 2015-09-16 DIAGNOSIS — E785 Hyperlipidemia, unspecified: Secondary | ICD-10-CM | POA: Insufficient documentation

## 2015-09-16 DIAGNOSIS — Z8249 Family history of ischemic heart disease and other diseases of the circulatory system: Secondary | ICD-10-CM | POA: Diagnosis not present

## 2015-09-16 DIAGNOSIS — I132 Hypertensive heart and chronic kidney disease with heart failure and with stage 5 chronic kidney disease, or end stage renal disease: Secondary | ICD-10-CM | POA: Diagnosis not present

## 2015-09-16 DIAGNOSIS — Z853 Personal history of malignant neoplasm of breast: Secondary | ICD-10-CM | POA: Diagnosis not present

## 2015-09-16 DIAGNOSIS — Z87440 Personal history of urinary (tract) infections: Secondary | ICD-10-CM | POA: Insufficient documentation

## 2015-09-16 DIAGNOSIS — N185 Chronic kidney disease, stage 5: Secondary | ICD-10-CM | POA: Diagnosis not present

## 2015-09-16 DIAGNOSIS — Z4901 Encounter for fitting and adjustment of extracorporeal dialysis catheter: Secondary | ICD-10-CM | POA: Diagnosis present

## 2015-09-16 DIAGNOSIS — E079 Disorder of thyroid, unspecified: Secondary | ICD-10-CM | POA: Insufficient documentation

## 2015-09-16 DIAGNOSIS — Z992 Dependence on renal dialysis: Secondary | ICD-10-CM | POA: Diagnosis not present

## 2015-09-16 DIAGNOSIS — I509 Heart failure, unspecified: Secondary | ICD-10-CM | POA: Insufficient documentation

## 2015-09-16 DIAGNOSIS — Z87891 Personal history of nicotine dependence: Secondary | ICD-10-CM | POA: Insufficient documentation

## 2015-09-16 DIAGNOSIS — M1990 Unspecified osteoarthritis, unspecified site: Secondary | ICD-10-CM | POA: Insufficient documentation

## 2015-09-16 DIAGNOSIS — I252 Old myocardial infarction: Secondary | ICD-10-CM | POA: Diagnosis not present

## 2015-09-16 DIAGNOSIS — E1122 Type 2 diabetes mellitus with diabetic chronic kidney disease: Secondary | ICD-10-CM | POA: Insufficient documentation

## 2015-09-16 DIAGNOSIS — E1121 Type 2 diabetes mellitus with diabetic nephropathy: Secondary | ICD-10-CM | POA: Diagnosis not present

## 2015-09-16 DIAGNOSIS — Z6838 Body mass index (BMI) 38.0-38.9, adult: Secondary | ICD-10-CM | POA: Diagnosis not present

## 2015-09-16 DIAGNOSIS — Z794 Long term (current) use of insulin: Secondary | ICD-10-CM | POA: Diagnosis not present

## 2015-09-16 DIAGNOSIS — M79602 Pain in left arm: Secondary | ICD-10-CM | POA: Insufficient documentation

## 2015-09-16 DIAGNOSIS — I2510 Atherosclerotic heart disease of native coronary artery without angina pectoris: Secondary | ICD-10-CM | POA: Insufficient documentation

## 2015-09-16 DIAGNOSIS — T82898A Other specified complication of vascular prosthetic devices, implants and grafts, initial encounter: Secondary | ICD-10-CM

## 2015-09-16 HISTORY — PX: PERIPHERAL VASCULAR CATHETERIZATION: SHX172C

## 2015-09-16 LAB — POCT I-STAT, CHEM 8
BUN: 26 mg/dL — AB (ref 6–20)
CALCIUM ION: 1.07 mmol/L — AB (ref 1.13–1.30)
CHLORIDE: 99 mmol/L — AB (ref 101–111)
Creatinine, Ser: 4.6 mg/dL — ABNORMAL HIGH (ref 0.44–1.00)
Glucose, Bld: 99 mg/dL (ref 65–99)
HEMATOCRIT: 38 % (ref 36.0–46.0)
Hemoglobin: 12.9 g/dL (ref 12.0–15.0)
POTASSIUM: 4.1 mmol/L (ref 3.5–5.1)
SODIUM: 139 mmol/L (ref 135–145)
TCO2: 28 mmol/L (ref 0–100)

## 2015-09-16 SURGERY — A/V SHUNTOGRAM/FISTULAGRAM
Anesthesia: LOCAL

## 2015-09-16 MED ORDER — LIDOCAINE HCL (PF) 1 % IJ SOLN
INTRAMUSCULAR | Status: AC
  Filled 2015-09-16: qty 30

## 2015-09-16 MED ORDER — SODIUM CHLORIDE 0.9% FLUSH
3.0000 mL | INTRAVENOUS | Status: DC | PRN
Start: 2015-09-16 — End: 2015-09-16

## 2015-09-16 MED ORDER — SODIUM CHLORIDE 0.9% FLUSH: 3.0000 mL | Freq: Two times a day (BID) | INTRAVENOUS | Status: DC

## 2015-09-16 MED ORDER — SODIUM CHLORIDE 0.9% FLUSH: 3.0000 mL | INTRAVENOUS | Status: DC | PRN

## 2015-09-16 MED ORDER — SODIUM CHLORIDE 0.9 % IV SOLN: 250.0000 mL | INTRAVENOUS | Status: DC | PRN

## 2015-09-16 MED ORDER — HEPARIN (PORCINE) IN NACL 2-0.9 UNIT/ML-% IJ SOLN
INTRAMUSCULAR | Status: AC
  Filled 2015-09-16: qty 500

## 2015-09-16 MED ORDER — IODIXANOL 320 MG/ML IV SOLN
INTRAVENOUS | Status: DC | PRN
  Administered 2015-09-16: 22 mL via INTRAVENOUS

## 2015-09-16 MED ORDER — LIDOCAINE HCL (PF) 1 % IJ SOLN
INTRAMUSCULAR | Status: DC | PRN
  Administered 2015-09-16: 5 mL

## 2015-09-16 MED ORDER — HEPARIN (PORCINE) IN NACL 2-0.9 UNIT/ML-% IJ SOLN
INTRAMUSCULAR | Status: DC | PRN
  Administered 2015-09-16: 500 mL

## 2015-09-16 SURGICAL SUPPLY — 10 items
BAG SNAP BAND KOVER 36X36 (MISCELLANEOUS) ×2 IMPLANT
COVER DOME SNAP 22 D (MISCELLANEOUS) ×2 IMPLANT
COVER PRB 48X5XTLSCP FOLD TPE (BAG) ×1 IMPLANT
COVER PROBE 5X48 (BAG) ×1
KIT MICROINTRODUCER STIFF 5F (SHEATH) ×2 IMPLANT
PROTECTION STATION PRESSURIZED (MISCELLANEOUS) ×2
STATION PROTECTION PRESSURIZED (MISCELLANEOUS) ×1 IMPLANT
STOPCOCK MORSE 400PSI 3WAY (MISCELLANEOUS) ×2 IMPLANT
TRAY PV CATH (CUSTOM PROCEDURE TRAY) ×2 IMPLANT
TUBING CIL FLEX 10 FLL-RA (TUBING) ×2 IMPLANT

## 2015-09-16 NOTE — Op Note (Signed)
    OPERATIVE REPORT  DATE OF SURGERY: 09/16/2015  PATIENT: Alexis [name], 70 y.o. female MRN: [id number]  DOB: 09-21-1945  PRE-OPERATIVE DIAGNOSIS: End stage renal disease with pain and left upper arm AV fistula  POST-OPERATIVE DIAGNOSIS:  Same  PROCEDURE: Left arm fistulogram  SURGEON:  Curt Jews, M.D.  ANESTHESIA:  1% lidocaine local  PLAN OF CARE: PACU   PATIENT DISPOSITION:  PACU - hemodynamically stable  PROCEDURE DETAILS: Patient is a 70 year old female reports she has pain after being on hemodialysis for approximately 2 hours. Her physical exam revealed good flow in her fistula with no evidence of false aneurysm or skin breakdown. Case taken to the anterior sutures this time for fistulogram.  Using local anesthesia she had the access near the arterial anastomosis and a micropuncture sheath was passed over the guidewire. Hand injections were  Used to visualize the fistula from the  IV site to the level of the heart.  Pressure was held on the distal fistula and retrograde flow into the arterial anastomosis showed no evidence of stenosis. The patient had a widely patent fistula throughout its course. There were some very smallnon-competing branches off her cephalic vein. As the cephalic vein entered the subclavian vein there was  Less than 50% narrowing.No cause for her pain was identified by fistulogram. The 4-0 Monocryl suture was placed in a pursestring fashion around the needle and the catheter removed and this was snugged down to  Give hemostasis. Patient was transferred to the holding area in stable condition   Curt Jews, M.D. 09/16/2015 2:13 PM

## 2015-09-16 NOTE — H&P (Signed)
Patient name: Alexis [name] MRN: [id number] DOB: 01-20-1946 Sex: female   [facility]  Reason for referral: No chief complaint on file.   HISTORY OF PRESENT ILLNESS: [age]-year-old female being dialyzed via left upper arm AV fistula. The dialysis center reports that patient is having pain at the time of her access. Patient reports this occurs while she is on hemodialysis access site in her upper arm. Is not having any hand symptoms.  Past Medical History  Diagnosis Date  . Hypertension   . Morbid obesity   . Coronary artery disease   . Chest pain   . Hyperlipidemia   . Hx of breast cancer   . CHF (congestive heart failure)   . SOB (shortness of breath)   . Proteinuria 01-08-13  . Dyslipidemia 01-08-13  . Thyroid disease 01-08-13    Hyper-parathyroidism-secondary  . Hx: UTI (urinary tract infection) 01-08-13  . Diabetes mellitus     Type 2  . Arthritis   . MI, old 32    INFERIOR WALL  . Chronic kidney disease 03/2014    dialysis t/th/sa  . Diabetic nephropathy 01-08-13  . Cancer 2005     left breast    Past Surgical History  Procedure Laterality Date  . Back surgery    . Tubal ligation    . Transthoracic echocardiogram  10/01/2010     Left ventricle: The cavity size was mildly dilated. Wall thickness was increased in a pattern of mild LVH. Systolic function was   mildly reduced. The estimated ejection fraction was in the range  of 45% to 50%.   . Arm surgery      Left arm trauma  . Mastectomy      Left  . Spine surgery    . Abdominal aortagram N/A 08/11/2014    Procedure: ABDOMINAL Ronny Flurry;  Surgeon: Chuck Hint, MD;  Location: [facility] CATH LAB;  Service: Cardiovascular;  Laterality: N/A;  . Tonsillectomy    . Appendectomy    . Abdominal hysterectomy    . Dialysis catheter    . Av fistula placement Left 08/21/2014    Procedure: LEFT ARM ARTERIOVENOUS (AV) FISTULA CREATION ;  Surgeon: Chuck Hint, MD;  Location: [facility] OR;  Service:  Vascular;  Laterality: Left;  . Coronary angioplasty  12/19/2003    inferior wall hypokinesis. ef 50%  . Cardiac catheterization  2010  . Cataract extraction w/phaco Left 12/22/2014    Procedure: CATARACT EXTRACTION PHACO AND INTRAOCULAR LENS PLACEMENT (IOC);  Surgeon: Gemma Payor, MD;  Location: [facility];  Service: Ophthalmology;  Laterality: Left;  CDE 13.48    Social History   Social History  . Marital Status: Legally Separated    Spouse Name: N/A  . Number of Children: 3  . Years of Education: N/A   Occupational History  . Retired    Social History Main Topics  . Smoking status: Former Smoker -- 1.00 packs/day for 30 years    Types: Cigarettes    Quit date: 05/16/1998  . Smokeless tobacco: Never Used  . Alcohol Use: No  . Drug Use: No  . Sexual Activity: Yes    Birth Control/ Protection: Post-menopausal   Other Topics Concern  . Not on file   Social History Narrative   Three grandchildren and a one year old great grandchild    Family History  Problem Relation Age of Onset  . Breast cancer Mother     Died age 55  .  Cancer Mother 51    Breast  . Heart disease Mother   . Hyperlipidemia Mother   . Hypertension Mother   . Heart attack Mother     Allergies as of 09/15/2015  . (No Known Allergies)    No current facility-administered medications on file prior to encounter.   Current Outpatient Prescriptions on File Prior to Encounter  Medication Sig Dispense Refill  . carvedilol (COREG) 25 MG tablet Take 25 mg by mouth 2 (two) times daily with a meal.    . docusate sodium (COLACE) 100 MG capsule Take 100 mg by mouth daily as needed for mild constipation.     . hydrALAZINE (APRESOLINE) 100 MG tablet Take 100 mg by mouth 3 (three) times daily.    . hydrOXYzine (ATARAX/VISTARIL) 25 MG tablet Take 25 mg by mouth 2 (two) times daily as needed for itching.    . insulin aspart protamine-insulin aspart (NOVOLOG 70/30) (70-30) 100 UNIT/ML injection Inject 20-25 Units into the  skin 2 (two) times daily with a meal. 20 UNITS IN THE MORNING AND 25 UNITS AT BEDTIME    . nitroGLYCERIN (NITROSTAT) 0.4 MG SL tablet Place 0.4 mg under the tongue every 5 (five) minutes as needed for chest pain.    . sevelamer carbonate (RENVELA) 800 MG tablet Take 800 mg by mouth 3 (three) times daily with meals.        REVIEW OF SYSTEMS:  Positives indicated with an "X"  CARDIOVASCULAR:  [ ]  chest pain   [ ]  chest pressure   [ ]  palpitations   [ ]  orthopnea   [ ]  dyspnea on exertion   [ ]  claudication   [ ]  rest pain   [ ]  DVT   [ ]  phlebitis PULMONARY:   [ ]  productive cough   [ ]  asthma   [ ]  wheezing NEUROLOGIC:   [ ]  weakness  [ ]  paresthesias  [ ]  aphasia  [ ]  amaurosis  [ ]  dizziness HEMATOLOGIC:   [ ]  bleeding problems   [ ]  clotting disorders MUSCULOSKELETAL:  [ ]  joint pain   [ ]  joint swelling GASTROINTESTINAL: [ ]   blood in stool  [ ]   hematemesis GENITOURINARY:  [ ]   dysuria  [ ]   hematuria PSYCHIATRIC:  [ ]  history of major depression INTEGUMENTARY:  [ ]  rashes  [ ]  ulcers CONSTITUTIONAL:  [ ]  fever   [ ]  chills  PHYSICAL EXAMINATION:  General: The patient is a well-nourished female, in no acute distress. Vital signs are BP 146/72 mmHg  Pulse 91  Temp(Src) 98.9 F (37.2 C) (Oral)  Resp 20  Ht 5\' 6"  (1.676 m)  Wt 241 lb (109.317 kg)  BMI 38.92 kg/m2  SpO2 97% Pulmonary: There is a good air exchange  Musculoskeletal: There are no major deformities.  There is no significant extremity pain. Neurologic: No focal weakness or paresthesias are detected, Skin: There are no ulcer or rashes noted. Psychiatric: The patient has normal affect. Cardiovascular: Upper arm AV fistula with excellent thrill and no evidence of pseudoaneurysm formation    Impression and Plan:  Painful left upper arm AV fistula with hemodialysis access. For fistulogram today to rule out mechanical source for her pain    Lakela Kuba [facility] Office:  [phone]

## 2015-09-16 NOTE — Discharge Instructions (Signed)
Fistulogram, Care After °Refer to this sheet in the next few weeks. These instructions provide you with information on caring for yourself after your procedure. Your health care provider may also give you more specific instructions. Your treatment has been planned according to current medical practices, but problems sometimes occur. Call your health care provider if you have any problems or questions after your procedure. °WHAT TO EXPECT AFTER THE PROCEDURE °After your procedure, it is typical to have the following: °· A small amount of discomfort in the area where the catheters were placed. °· A small amount of bruising around the fistula. °· Sleepiness and fatigue. °HOME CARE INSTRUCTIONS °· Rest at home for the day following your procedure. °· Do not drive or operate heavy machinery while taking pain medicine. °· Take medicines only as directed by your health care provider. °· Do not take baths, swim, or use a hot tub until your health care provider approves. You may shower 24 hours after the procedure or as directed by your health care provider. °· There are many different ways to close and cover an incision, including stitches, skin glue, and adhesive strips. Follow your health care provider's instructions on: °¨ Incision care. °¨ Bandage (dressing) changes and removal. °¨ Incision closure removal. °· Monitor your dialysis fistula carefully. °SEEK MEDICAL CARE IF: °· You have drainage, redness, swelling, or pain at your catheter site. °· You have a fever. °· You have chills. °SEEK IMMEDIATE MEDICAL CARE IF: °· You feel weak. °· You have trouble balancing. °· You have trouble moving your arms or legs. °· You have problems with your speech or vision. °· You can no longer feel a vibration or buzz when you put your fingers over your dialysis fistula. °· The limb that was used for the procedure: °¨ Swells. °¨ Is painful. °¨ Is cold. °¨ Is discolored, such as blue or pale white. °  °This information is not intended  to replace advice given to you by your health care provider. Make sure you discuss any questions you have with your health care provider. °  °Document Released: 09/16/2013 Document Reviewed: 09/16/2013 °Elsevier Interactive Patient Education ©2016 [employer] ° °

## 2015-09-16 NOTE — Telephone Encounter (Signed)
-----   Message from Mena Goes, RN sent at 09/16/2015  2:32 PM EDT ----- Regarding: Juluis Rainier, no follow up on this   ----- Message -----    From: Rosetta Posner, MD    Sent: 09/16/2015   2:18 PM      To: [facility] Charge Pool  Left arm fistulogram. No intervention. Follow-up when necessary.

## 2015-09-17 ENCOUNTER — Encounter (HOSPITAL_COMMUNITY): Payer: Self-pay | Admitting: Vascular Surgery

## 2016-03-03 ENCOUNTER — Ambulatory Visit (INDEPENDENT_AMBULATORY_CARE_PROVIDER_SITE_OTHER): Payer: Medicare Other | Admitting: Specialist

## 2016-03-03 DIAGNOSIS — M47816 Spondylosis without myelopathy or radiculopathy, lumbar region: Secondary | ICD-10-CM | POA: Diagnosis not present

## 2016-03-04 ENCOUNTER — Other Ambulatory Visit (INDEPENDENT_AMBULATORY_CARE_PROVIDER_SITE_OTHER): Payer: Self-pay | Admitting: Surgery

## 2016-03-04 DIAGNOSIS — M545 Low back pain: Secondary | ICD-10-CM

## 2016-03-10 ENCOUNTER — Ambulatory Visit (HOSPITAL_COMMUNITY)
Admission: RE | Admit: 2016-03-10 | Discharge: 2016-03-10 | Disposition: A | Discharge status: Home / Self Care | Payer: Medicare Other | Source: Ambulatory Visit | Attending: Surgery | Admitting: Surgery

## 2016-03-10 DIAGNOSIS — M48061 Spinal stenosis, lumbar region without neurogenic claudication: Secondary | ICD-10-CM | POA: Diagnosis not present

## 2016-03-10 DIAGNOSIS — M6281 Muscle weakness (generalized): Secondary | ICD-10-CM | POA: Diagnosis not present

## 2016-03-10 DIAGNOSIS — M545 Low back pain: Secondary | ICD-10-CM | POA: Insufficient documentation

## 2016-04-14 ENCOUNTER — Encounter (INDEPENDENT_AMBULATORY_CARE_PROVIDER_SITE_OTHER): Payer: Self-pay | Admitting: Specialist

## 2016-04-14 ENCOUNTER — Ambulatory Visit (INDEPENDENT_AMBULATORY_CARE_PROVIDER_SITE_OTHER): Payer: Medicare Other | Admitting: Specialist

## 2016-04-14 VITALS — BP 158/65 | HR 83 | Ht 66.0 in | Wt 241.0 lb

## 2016-04-14 DIAGNOSIS — M48062 Spinal stenosis, lumbar region with neurogenic claudication: Secondary | ICD-10-CM

## 2016-04-14 DIAGNOSIS — M5136 Other intervertebral disc degeneration, lumbar region: Secondary | ICD-10-CM

## 2016-04-14 DIAGNOSIS — Z9889 Other specified postprocedural states: Secondary | ICD-10-CM

## 2016-04-14 NOTE — Patient Instructions (Signed)
Avoid bending, stooping and avoid lifting weights greater than 10 lbs. Avoid prolong standing and walking. Avoid frequent bending and stooping  No lifting greater than 10 lbs. May use ice or moist heat for pain. Weight loss is of benefit. Handicap license is approved. Dr. Newton's secretary/Assistant will call to arrange for epidural steroid injection  

## 2016-04-14 NOTE — Progress Notes (Signed)
Office Visit Note   Patient: Alexis [name]           Date of Birth: [birth year]           MRN: [id number] Visit Date: 04/14/2016              Requested by: Nolene Ebbs, MD [address] [place], [place] [address] PCP: Philis Fendt, MD   Assessment & Plan: Visit Diagnoses:  1. Spinal stenosis of lumbar region with neurogenic claudication   2. Hx of decompressive lumbar laminectomy   3. Lumbar degenerative disc disease     Plan: Avoid bending, stooping and avoid lifting weights greater than 10 lbs. Avoid prolong standing and walking. Avoid frequent bending and stooping  No lifting greater than 10 lbs. May use ice or moist heat for pain. Weight loss is of benefit. Handicap license is approved. Dr. Romona Curls secretary/Assistant will call to arrange for epidural steroid injection  Follow-Up Instructions: Return in about 6 weeks (around 05/26/2016).   Orders:  Orders Placed This Encounter  Procedures  . Ambulatory referral to Physical Medicine Rehab   No orders of the defined types were placed in this encounter.     Procedures: No procedures performed   Clinical Data: Findings:  Lumbar MRI shows, mild L2-3 central stenosis, moderate stenosis at the L3-4 level that narrows the thecal sac, at L4-5 is previous left L4-5 hemilaminectomy.  There is mild to moderate subarticular stenosis bilat L4-5.    Subjective: Chief Complaint  Patient presents with  . Lower Back - Pain    Patient returning to review MRI Lumbar spine. Still having a lot of low back pain. States no changes. Ambulates with walker. Pain with ambulating any distance, she has to sit down to get relief. Back pain is worse than leg pain but also experiencing pain in the buttocks and thighs with standing. No bowel or bladder difficulty. Is on chronic renal dialysis for renal failure, does not pass urine often. There is intermittant numbness in the back of the legs and thighs, also with pins and needles with  sitting on the commode. No previous ESIs, had left lumbar laminectomy 7 years ago in [place], [place]. [facility]. Dr. Jeanie Cooks is her primary care and she does have significant coomorbities to consideration of surgical treatment.     Review of Systems  HENT: Negative.   Eyes: Positive for visual disturbance.  Respiratory: Negative.   Cardiovascular: Positive for leg swelling.  Gastrointestinal: Negative.   Genitourinary: Negative.   Musculoskeletal: Positive for arthralgias, back pain, gait problem and myalgias.  Skin: Negative.   Allergic/Immunologic: Negative for environmental allergies and food allergies.  Neurological: Positive for weakness and numbness.  Hematological: Negative.   Psychiatric/Behavioral: Negative.      Objective: Vital Signs: BP (!) 158/65   Pulse 83   Ht 5\' 6"  (6.270 m)   Wt 241 lb (109.3 kg)   BMI 38.90 kg/m   Physical Exam  Constitutional: She is oriented to person, place, and time. She appears well-developed and well-nourished.  Eyes: EOM are normal. Pupils are equal, round, and reactive to light.  Neck: Normal range of motion. Neck supple.  Pulmonary/Chest: Effort normal and breath sounds normal.  Abdominal: Soft. Bowel sounds are normal.  Musculoskeletal: She exhibits no edema, tenderness or deformity.  Neurological: She is alert and oriented to person, place, and time.  Skin: Skin is warm and dry.  Psychiatric: She has a normal mood and affect. Her behavior is normal. Judgment and  thought content normal.    Back Exam   Tenderness  The patient is experiencing tenderness in the lumbar.  Range of Motion  Extension: abnormal  Flexion: abnormal  Lateral Bend Right: abnormal  Lateral Bend Left: abnormal  Rotation Right: abnormal  Rotation Left: abnormal   Muscle Strength  Right Quadriceps:  5/5  Left Quadriceps:  5/5  Right Hamstrings:  5/5   Tests  Straight leg raise right: negative Straight leg raise left: negative  Reflexes    Patellar: Hyporeflexic Achilles: Hyporeflexic Babinski's sign: normal   Other  Toe Walk: normal Heel Walk: normal Sensation: normal Gait: normal  Erythema: no back redness Scars: absent      Specialty Comments:  No specialty comments available.  Imaging: No results found.   PMFS History: Patient Active Problem List   Diagnosis Date Noted  . Essential hypertension 08/15/2012  . CAD (coronary artery disease) 03/25/2011  . Diabetes mellitus 03/25/2011  . Chest pain 03/25/2011  . CHF (congestive heart failure) ([place]) 10/14/2010  . CKD (chronic kidney disease) 10/14/2010  . Obesity 10/14/2010   Past Medical History:  Diagnosis Date  . Arthritis   . Cancer [facility]) 2005    left breast  . Chest pain   . CHF (congestive heart failure) ([place])   . Chronic kidney disease 03/2014   dialysis t/th/sa  . Coronary artery disease   . Diabetes mellitus    Type 2  . Diabetic nephropathy ([place]) 01-08-13  . Dyslipidemia 01-08-13  . Hx of breast cancer   . Hx: UTI (urinary tract infection) 01-08-13  . Hyperlipidemia   . Hypertension   . 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 Morbid obesity ([place])   . Proteinuria 01-08-13  . SOB (shortness of breath)   . Thyroid disease 01-08-13   Hyper-parathyroidism-secondary    Family History  Problem Relation Age of Onset  . Breast cancer Mother     Died age 20  . Cancer Mother 6    Breast  . Heart disease Mother   . Hyperlipidemia Mother   . Hypertension Mother   . Heart attack Mother     Past Surgical History:  Procedure Laterality Date  . ABDOMINAL AORTAGRAM N/A 08/11/2014   Procedure: ABDOMINAL Maxcine Ham;  Surgeon: Angelia Mould, MD;  Location: [facility] CATH LAB;  Service: Cardiovascular;  Laterality: N/A;  . ABDOMINAL HYSTERECTOMY    . APPENDECTOMY    . Arm surgery     Left arm trauma  . AV FISTULA PLACEMENT Left 08/21/2014   Procedure: LEFT ARM ARTERIOVENOUS (AV) FISTULA CREATION ;  Surgeon: Angelia Mould, MD;  Location: [place];  Service: Vascular;  Laterality: Left;  . BACK SURGERY    . CARDIAC CATHETERIZATION  2010  . CATARACT EXTRACTION W/PHACO Left 12/22/2014   Procedure: CATARACT EXTRACTION PHACO AND INTRAOCULAR LENS PLACEMENT (IOC);  Surgeon: Tonny Branch, MD;  Location: [facility];  Service: Ophthalmology;  Laterality: Left;  CDE 13.48  . CORONARY ANGIOPLASTY  12/19/2003   inferior wall hypokinesis. ef 50%  . dialysis catheter    . MASTECTOMY     Left  . PERIPHERAL VASCULAR CATHETERIZATION N/A 09/16/2015   Procedure: Fistulagram;  Surgeon: Rosetta Posner, MD;  Location: [place] CV LAB;  Service: Cardiovascular;  Laterality: N/A;  . SPINE SURGERY    . TONSILLECTOMY    . TRANSTHORACIC ECHOCARDIOGRAM  10/01/2010    Left ventricle: The cavity size was mildly dilated. Wall thickness was increased in a pattern of  mild LVH. Systolic function was   mildly reduced. The estimated ejection fraction was in the range  of 45% to 50%.   . TUBAL LIGATION     Social History   Occupational History  . Retired    Social History Main Topics  . Smoking status: Former Smoker    Packs/day: 1.00    Years: 30.00    Types: Cigarettes    Quit date: 05/16/1998  . Smokeless tobacco: Never Used  . Alcohol use No  . Drug use: No  . Sexual activity: Yes    Birth control/ protection: Post-menopausal

## 2016-04-18 ENCOUNTER — Telehealth (INDEPENDENT_AMBULATORY_CARE_PROVIDER_SITE_OTHER): Payer: Self-pay | Admitting: *Deleted

## 2016-04-18 NOTE — Telephone Encounter (Signed)
im not sure I did not call patient, Checked to see if she had an appt but its not scheduled until 05/28/2015. Unsure who called Thanks. Nira Conn

## 2016-04-18 NOTE — Telephone Encounter (Signed)
Pt called stating she got a missed call

## 2016-04-20 NOTE — Telephone Encounter (Signed)
Called patient and scheduled her for injection 04/28/16 and 05/19/16 at 230 with driver and no blood thinners.

## 2016-04-20 NOTE — Telephone Encounter (Signed)
Patient is called to see when she will be scheduled for esi injection.

## 2016-04-20 NOTE — Telephone Encounter (Signed)
Pt called stating she was supposed to start shots for her back but is unsure what kind. Pt requesting call back

## 2016-04-28 ENCOUNTER — Encounter (INDEPENDENT_AMBULATORY_CARE_PROVIDER_SITE_OTHER): Payer: Self-pay | Admitting: Physical Medicine and Rehabilitation

## 2016-04-28 ENCOUNTER — Ambulatory Visit (INDEPENDENT_AMBULATORY_CARE_PROVIDER_SITE_OTHER): Payer: Medicare Other | Admitting: Physical Medicine and Rehabilitation

## 2016-04-28 VITALS — BP 159/73 | HR 89 | Temp 98.7°F

## 2016-04-28 DIAGNOSIS — M48062 Spinal stenosis, lumbar region with neurogenic claudication: Secondary | ICD-10-CM | POA: Insufficient documentation

## 2016-04-28 MED ORDER — LIDOCAINE HCL (PF) 1 % IJ SOLN: 0.3300 mL | Freq: Once | INTRAMUSCULAR | Status: DC

## 2016-04-28 MED ORDER — METHYLPREDNISOLONE ACETATE 80 MG/ML IJ SUSP
80.0000 mg | Freq: Once | INTRAMUSCULAR | Status: AC
  Administered 2016-04-28: 80 mg

## 2016-04-28 NOTE — Patient Instructions (Signed)

## 2016-04-28 NOTE — Procedures (Signed)
Lumbar Epidural Steroid Injection - Interlaminar Approach with Fluoroscopic Guidance  Patient: Alexis [name]      Date of Birth: Feb 01, 1946 MRN: [id number] PCP: Philis Fendt, MD      Visit Date: 04/28/2016   Universal Protocol:    Date/Time: 12/14/172:51 PM  Consent Given By: the patient  Position: PRONE  Additional Comments: Vital signs were monitored before and after the procedure. Patient was prepped and draped in the usual sterile fashion. The correct patient, procedure, and site was verified.   Injection Procedure Details:  Procedure Site One Meds Administered:  Meds ordered this encounter  Medications  . lidocaine (PF) (XYLOCAINE) 1 % injection 0.3 mL  . methylPREDNISolone acetate (DEPO-MEDROL) injection 80 mg     Laterality: Right  Location/Site:  L2-L3  Needle size: 20 G  Needle type: Tuohy  Needle Placement: Paramedian epidural  Findings:  -Contrast Used: 1 mL iohexol 180 mg iodine/mL   -Comments: Excellent flow of contrast into the epidural space.  Procedure Details: Using a paramedian approach from the side mentioned above, the region overlying the inferior lamina was localized under fluoroscopic visualization and the soft tissues overlying this structure were infiltrated with 4 ml. of 1% Lidocaine without Epinephrine. The Tuohy needle was inserted into the epidural space using a paramedian approach.   The epidural space was localized using loss of resistance along with lateral and bi-planar fluoroscopic views.  After negative aspirate for air, blood, and CSF, a 2 ml. volume of Isovue-250 was injected into the epidural space and the flow of contrast was observed. Radiographs were obtained for documentation purposes.    The injectate was administered into the level noted above.   Additional Comments:  The patient tolerated the procedure well No complications occurred She did actually have a nosebleed was getting on to the table. Dressing: Band-Aid  and 2x2 sterile gauze     Post-procedure details: Patient was observed during the procedure. Post-procedure instructions were reviewed.  Patient left the clinic in stable condition.

## 2016-04-28 NOTE — Progress Notes (Signed)
Alexis [name] - 70 y.o. female MRN [id number]  Date of birth: [birth year]  Office Visit Note: Visit Date: 04/28/2016 PCP: Alexis Fendt, MD Referred by: Alexis Ebbs, MD  Subjective: Chief Complaint  Patient presents with  . Lower Back - Pain   HPI: Alexis [name] is a very pleasant 70 year old patient who sees Dr. Louanne [name]. She is a complicated patient on dialysis. She has had prior lumbar laminectomy at L4-5 in [place]. Lower back pain with walking and standing. Occosoanl buttock and thigh pain. Relief with sitting. Ambulates with rolling walker. Numbness and tingling in feet at times. MRI was reviewed below but shows moderate stenosis at L3-4 and mild-to-moderate at L2-3.     ROS Otherwise per HPI.  Assessment & Plan: Visit Diagnoses:  1. Spinal stenosis of lumbar region with neurogenic claudication     Plan: Findings:  Neurogenic claudication pain from stenosis at L2-S3 and L3-4. Right L2-3 interlaminar epidural injection performed today. Patient appeared to have somewhat of a lumbarized L5-S1 segment.    Meds & Orders:  Meds ordered this encounter  Medications  . lidocaine (PF) (XYLOCAINE) 1 % injection 0.3 mL  . methylPREDNISolone acetate (DEPO-MEDROL) injection 80 mg    Orders Placed This Encounter  Procedures  . Epidural Steroid injection    Follow-up: Return for scheduled repeat if necessary..   Procedures: No procedures performed  Lumbar Epidural Steroid Injection - Interlaminar Approach with Fluoroscopic Guidance  Patient: Alexis [name]      Date of Birth: 30-Dec-1945 MRN: [id number] PCP: Alexis Fendt, MD      Visit Date: 04/28/2016   Universal Protocol:    Date/Time: 12/14/172:51 PM  Consent Given By: the patient  Position: PRONE  Additional Comments: Vital signs were monitored before and after the procedure. Patient was prepped and draped in the usual sterile fashion. The correct patient, procedure, and site was verified.   Injection  Procedure Details:  Procedure Site One Meds Administered:  Meds ordered this encounter  Medications  . lidocaine (PF) (XYLOCAINE) 1 % injection 0.3 mL  . methylPREDNISolone acetate (DEPO-MEDROL) injection 80 mg     Laterality: Right  Location/Site:  L2-L3  Needle size: 20 G  Needle type: Tuohy  Needle Placement: Paramedian epidural  Findings:  -Contrast Used: 1 mL iohexol 180 mg iodine/mL   -Comments: Excellent flow of contrast into the epidural space.  Procedure Details: Using a paramedian approach from the side mentioned above, the region overlying the inferior lamina was localized under fluoroscopic visualization and the soft tissues overlying this structure were infiltrated with 4 ml. of 1% Lidocaine without Epinephrine. The Tuohy needle was inserted into the epidural space using a paramedian approach.   The epidural space was localized using loss of resistance along with lateral and bi-planar fluoroscopic views.  After negative aspirate for air, blood, and CSF, a 2 ml. volume of Isovue-250 was injected into the epidural space and the flow of contrast was observed. Radiographs were obtained for documentation purposes.    The injectate was administered into the level noted above.   Additional Comments:  The patient tolerated the procedure well No complications occurred She did actually have a nosebleed was getting on to the table. Dressing: Band-Aid and 2x2 sterile gauze     Post-procedure details: Patient was observed during the procedure. Post-procedure instructions were reviewed.  Patient left the clinic in stable condition.      Clinical History: L-spine MRI dated 03/10/2016 IMPRESSION: Suboptimal image quality  Large amount of  fluid in the peritoneal cavity  Mild to moderate spinal stenosis at L2-3 with mild foraminal narrowing bilaterally  Moderate spinal stenosis L3-4 with subarticular and foraminal stenosis on the left  Left laminotomy at L4-5  with left-sided spurring causing subarticular and foraminal stenosis on the left.   She reports that she quit smoking about 17 years ago. Her smoking use included Cigarettes. She has a 30.00 pack-year smoking history. She has never used smokeless tobacco. No results for input(s): HGBA1C, LABURIC in the last 8760 hours.  Objective:  VS:  HT:    WT:   BMI:     BP:(!) 159/73  HR:89bpm  TEMP:98.7 F (37.1 C)(Oral)  RESP:97 % Physical Exam  Musculoskeletal:  The patient uses a rolling walker. She stands with difficulty. She has good distal strength.    Ortho Exam Imaging: No results found.  Past Medical/Family/Surgical/Social History: Medications & Allergies reviewed per EMR Patient Active Problem List   Diagnosis Date Noted  . 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 Essential hypertension 08/15/2012  . CAD (coronary artery disease) 03/25/2011  . Diabetes mellitus 03/25/2011  . Chest pain 03/25/2011  . CHF (congestive heart failure) ([place]) 10/14/2010  . CKD (chronic kidney disease) 10/14/2010  . Obesity 10/14/2010   Past Medical History:  Diagnosis Date  . Arthritis   . Cancer [facility]) 2005    left breast  . Chest pain   . CHF (congestive heart failure) ([place])   . Chronic kidney disease 03/2014   dialysis t/th/sa  . Coronary artery disease   . Diabetes mellitus    Type 2  . Diabetic nephropathy ([place]) 01-08-13  . Dyslipidemia 01-08-13  . Hx of breast cancer   . Hx: UTI (urinary tract infection) 01-08-13  . Hyperlipidemia   . Hypertension   . MI, old 87   INFERIOR WALL  . Morbid obesity ([place])   . Proteinuria 01-08-13  . SOB (shortness of breath)   . Thyroid disease 01-08-13   Hyper-parathyroidism-secondary   Family History  Problem Relation Age of Onset  . Breast cancer Mother     Died age 70  . Cancer Mother 8    Breast  . Heart disease Mother   . Hyperlipidemia Mother   . Hypertension Mother   . Heart attack Mother     Past Surgical History:  Procedure Laterality Date  . ABDOMINAL AORTAGRAM N/A 08/11/2014   Procedure: ABDOMINAL Maxcine Ham;  Surgeon: Angelia Mould, MD;  Location: [facility] CATH LAB;  Service: Cardiovascular;  Laterality: N/A;  . ABDOMINAL HYSTERECTOMY    . APPENDECTOMY    . Arm surgery     Left arm trauma  . AV FISTULA PLACEMENT Left 08/21/2014   Procedure: LEFT ARM ARTERIOVENOUS (AV) FISTULA CREATION ;  Surgeon: Angelia Mould, MD;  Location: [place];  Service: Vascular;  Laterality: Left;  . BACK SURGERY    . CARDIAC CATHETERIZATION  2010  . CATARACT EXTRACTION W/PHACO Left 12/22/2014   Procedure: CATARACT EXTRACTION PHACO AND INTRAOCULAR LENS PLACEMENT (IOC);  Surgeon: Tonny Branch, MD;  Location: [facility];  Service: Ophthalmology;  Laterality: Left;  CDE 13.48  . CORONARY ANGIOPLASTY  12/19/2003   inferior wall hypokinesis. ef 50%  . dialysis catheter    . MASTECTOMY     Left  . PERIPHERAL VASCULAR CATHETERIZATION N/A 09/16/2015   Procedure: Fistulagram;  Surgeon: Rosetta Posner, MD;  Location: [place] CV LAB;  Service: Cardiovascular;  Laterality: N/A;  . SPINE SURGERY    .  TONSILLECTOMY    . TRANSTHORACIC ECHOCARDIOGRAM  10/01/2010    Left ventricle: The cavity size was mildly dilated. Wall thickness was increased in a pattern of mild LVH. Systolic function was   mildly reduced. The estimated ejection fraction was in the range  of 45% to 50%.   . TUBAL LIGATION     Social History   Occupational History  . Retired    Social History Main Topics  . Smoking status: Former Smoker    Packs/day: 1.00    Years: 30.00    Types: Cigarettes    Quit date: 05/16/1998  . Smokeless tobacco: Never Used  . Alcohol use No  . Drug use: No  . Sexual activity: Yes    Birth control/ protection: Post-menopausal

## 2016-05-19 ENCOUNTER — Encounter (INDEPENDENT_AMBULATORY_CARE_PROVIDER_SITE_OTHER): Payer: Medicare Other | Admitting: Physical Medicine and Rehabilitation

## 2016-05-27 ENCOUNTER — Ambulatory Visit (INDEPENDENT_AMBULATORY_CARE_PROVIDER_SITE_OTHER): Payer: Medicare Other | Admitting: Specialist

## 2017-08-04 ENCOUNTER — Encounter (HOSPITAL_COMMUNITY): Payer: Self-pay

## 2017-08-04 ENCOUNTER — Other Ambulatory Visit: Payer: Self-pay

## 2017-08-04 ENCOUNTER — Emergency Department (HOSPITAL_COMMUNITY): Payer: Medicare Other

## 2017-08-04 ENCOUNTER — Observation Stay (HOSPITAL_COMMUNITY)
Admission: EM | Admit: 2017-08-04 | Discharge: 2017-08-06 | Disposition: A | Discharge status: Home / Self Care | Payer: Medicare Other | Attending: Nephrology | Admitting: Nephrology

## 2017-08-04 ENCOUNTER — Observation Stay (HOSPITAL_COMMUNITY): Payer: Medicare Other

## 2017-08-04 DIAGNOSIS — N189 Chronic kidney disease, unspecified: Secondary | ICD-10-CM | POA: Diagnosis not present

## 2017-08-04 DIAGNOSIS — I509 Heart failure, unspecified: Secondary | ICD-10-CM | POA: Diagnosis not present

## 2017-08-04 DIAGNOSIS — R0789 Other chest pain: Principal | ICD-10-CM | POA: Insufficient documentation

## 2017-08-04 DIAGNOSIS — R51 Headache: Secondary | ICD-10-CM

## 2017-08-04 DIAGNOSIS — Z79899 Other long term (current) drug therapy: Secondary | ICD-10-CM | POA: Insufficient documentation

## 2017-08-04 DIAGNOSIS — E118 Type 2 diabetes mellitus with unspecified complications: Secondary | ICD-10-CM

## 2017-08-04 DIAGNOSIS — Z794 Long term (current) use of insulin: Secondary | ICD-10-CM | POA: Diagnosis not present

## 2017-08-04 DIAGNOSIS — M48062 Spinal stenosis, lumbar region with neurogenic claudication: Secondary | ICD-10-CM | POA: Diagnosis not present

## 2017-08-04 DIAGNOSIS — R0602 Shortness of breath: Secondary | ICD-10-CM | POA: Insufficient documentation

## 2017-08-04 DIAGNOSIS — E1122 Type 2 diabetes mellitus with diabetic chronic kidney disease: Secondary | ICD-10-CM | POA: Diagnosis not present

## 2017-08-04 DIAGNOSIS — R079 Chest pain, unspecified: Secondary | ICD-10-CM | POA: Diagnosis not present

## 2017-08-04 DIAGNOSIS — Z853 Personal history of malignant neoplasm of breast: Secondary | ICD-10-CM | POA: Insufficient documentation

## 2017-08-04 DIAGNOSIS — I252 Old myocardial infarction: Secondary | ICD-10-CM | POA: Diagnosis not present

## 2017-08-04 DIAGNOSIS — I2510 Atherosclerotic heart disease of native coronary artery without angina pectoris: Secondary | ICD-10-CM | POA: Diagnosis not present

## 2017-08-04 DIAGNOSIS — Z87891 Personal history of nicotine dependence: Secondary | ICD-10-CM | POA: Diagnosis not present

## 2017-08-04 DIAGNOSIS — Z992 Dependence on renal dialysis: Secondary | ICD-10-CM | POA: Insufficient documentation

## 2017-08-04 DIAGNOSIS — I130 Hypertensive heart and chronic kidney disease with heart failure and stage 1 through stage 4 chronic kidney disease, or unspecified chronic kidney disease: Secondary | ICD-10-CM | POA: Diagnosis not present

## 2017-08-04 DIAGNOSIS — N186 End stage renal disease: Secondary | ICD-10-CM | POA: Diagnosis not present

## 2017-08-04 LAB — CBC
HCT: 36.4 % (ref 36.0–46.0)
Hemoglobin: 11.8 g/dL — ABNORMAL LOW (ref 12.0–15.0)
MCH: 26.5 pg (ref 26.0–34.0)
MCHC: 32.4 g/dL (ref 30.0–36.0)
MCV: 81.8 fL (ref 78.0–100.0)
PLATELETS: 190 10*3/uL (ref 150–400)
RBC: 4.45 MIL/uL (ref 3.87–5.11)
RDW: 14.3 % (ref 11.5–15.5)
WBC: 7.9 10*3/uL (ref 4.0–10.5)

## 2017-08-04 LAB — BASIC METABOLIC PANEL
Anion gap: 13 (ref 5–15)
BUN: 35 mg/dL — ABNORMAL HIGH (ref 6–20)
CALCIUM: 10 mg/dL (ref 8.9–10.3)
CHLORIDE: 98 mmol/L — AB (ref 101–111)
CO2: 26 mmol/L (ref 22–32)
CREATININE: 5.37 mg/dL — AB (ref 0.44–1.00)
GFR calc Af Amer: 8 mL/min — ABNORMAL LOW (ref >60)
GFR calc non Af Amer: 7 mL/min — ABNORMAL LOW (ref >60)
Glucose, Bld: 178 mg/dL — ABNORMAL HIGH (ref 65–99)
Potassium: 4.4 mmol/L (ref 3.5–5.1)
SODIUM: 137 mmol/L (ref 135–145)

## 2017-08-04 LAB — D-DIMER, QUANTITATIVE: D-Dimer, Quant: 2.84 ug/mL-FEU — ABNORMAL HIGH (ref 0.00–0.50)

## 2017-08-04 LAB — I-STAT TROPONIN, ED
TROPONIN I, POC: 0.06 ng/mL (ref 0.00–0.08)
TROPONIN I, POC: 0.02 ng/mL (ref 0.00–0.08)

## 2017-08-04 LAB — BRAIN NATRIURETIC PEPTIDE: B NATRIURETIC PEPTIDE 5: 1506.3 pg/mL — AB (ref 0.0–100.0)

## 2017-08-04 LAB — GLUCOSE, CAPILLARY: GLUCOSE-CAPILLARY: 149 mg/dL — AB (ref 65–99)

## 2017-08-04 MED ORDER — GI COCKTAIL ~~LOC~~: 30.0000 mL | Freq: Four times a day (QID) | ORAL | Status: DC | PRN

## 2017-08-04 MED ORDER — TIZANIDINE HCL 4 MG PO TABS
4.0000 mg | ORAL_TABLET | Freq: Every day | ORAL | Status: DC | PRN
  Filled 2017-08-04: qty 1

## 2017-08-04 MED ORDER — FERRIC CITRATE 1 GM 210 MG(FE) PO TABS
420.0000 mg | ORAL_TABLET | Freq: Three times a day (TID) | ORAL | Status: DC
  Administered 2017-08-05 – 2017-08-06 (×3): 420 mg via ORAL
  Filled 2017-08-04 (×5): qty 2

## 2017-08-04 MED ORDER — CARVEDILOL 25 MG PO TABS
25.0000 mg | ORAL_TABLET | Freq: Two times a day (BID) | ORAL | Status: DC
  Administered 2017-08-05 – 2017-08-06 (×2): 25 mg via ORAL
  Filled 2017-08-04 (×2): qty 1

## 2017-08-04 MED ORDER — ISOSORBIDE MONONITRATE ER 30 MG PO TB24
30.0000 mg | ORAL_TABLET | Freq: Every day | ORAL | Status: DC
  Administered 2017-08-04 – 2017-08-05 (×2): 30 mg via ORAL
  Filled 2017-08-04 (×2): qty 1

## 2017-08-04 MED ORDER — NITROGLYCERIN 0.4 MG SL SUBL: 0.4000 mg | SUBLINGUAL_TABLET | SUBLINGUAL | Status: DC | PRN

## 2017-08-04 MED ORDER — HYDRALAZINE HCL 50 MG PO TABS
100.0000 mg | ORAL_TABLET | Freq: Three times a day (TID) | ORAL | Status: DC
  Administered 2017-08-04 – 2017-08-05 (×2): 100 mg via ORAL
  Filled 2017-08-04 (×3): qty 2

## 2017-08-04 MED ORDER — HYDROXYZINE HCL 25 MG PO TABS
25.0000 mg | ORAL_TABLET | Freq: Two times a day (BID) | ORAL | Status: DC | PRN
  Administered 2017-08-05: 25 mg via ORAL
  Filled 2017-08-04: qty 1

## 2017-08-04 MED ORDER — ONDANSETRON HCL 4 MG/2ML IJ SOLN: 4.0000 mg | Freq: Four times a day (QID) | INTRAMUSCULAR | Status: DC | PRN

## 2017-08-04 MED ORDER — INSULIN ASPART 100 UNIT/ML ~~LOC~~ SOLN
0.0000 [IU] | Freq: Three times a day (TID) | SUBCUTANEOUS | Status: DC
  Administered 2017-08-05 (×3): 2 [IU] via SUBCUTANEOUS

## 2017-08-04 MED ORDER — INSULIN ASPART PROT & ASPART (70-30 MIX) 100 UNIT/ML ~~LOC~~ SUSP
20.0000 [IU] | Freq: Every day | SUBCUTANEOUS | Status: DC
  Administered 2017-08-05 – 2017-08-06 (×2): 20 [IU] via SUBCUTANEOUS
  Filled 2017-08-04: qty 10

## 2017-08-04 MED ORDER — LIDOCAINE HCL (PF) 1 % IJ SOLN: 0.3300 mL | Freq: Once | INTRAMUSCULAR | Status: DC

## 2017-08-04 MED ORDER — SEVELAMER CARBONATE 800 MG PO TABS
800.0000 mg | ORAL_TABLET | Freq: Three times a day (TID) | ORAL | Status: DC
  Administered 2017-08-05 – 2017-08-06 (×3): 800 mg via ORAL
  Filled 2017-08-04 (×3): qty 1

## 2017-08-04 MED ORDER — DOCUSATE SODIUM 100 MG PO CAPS: 100.0000 mg | ORAL_CAPSULE | Freq: Every day | ORAL | Status: DC | PRN

## 2017-08-04 MED ORDER — ACETAMINOPHEN 500 MG PO TABS
1000.0000 mg | ORAL_TABLET | Freq: Once | ORAL | Status: AC
  Administered 2017-08-04: 1000 mg via ORAL
  Filled 2017-08-04: qty 2

## 2017-08-04 MED ORDER — HEPARIN SODIUM (PORCINE) 5000 UNIT/ML IJ SOLN
5000.0000 [IU] | Freq: Three times a day (TID) | INTRAMUSCULAR | Status: DC
  Administered 2017-08-04 – 2017-08-05 (×3): 5000 [IU] via SUBCUTANEOUS
  Filled 2017-08-04 (×3): qty 1

## 2017-08-04 MED ORDER — INSULIN ASPART PROT & ASPART (70-30 MIX) 100 UNIT/ML ~~LOC~~ SUSP
25.0000 [IU] | Freq: Every day | SUBCUTANEOUS | Status: DC
  Administered 2017-08-05: 25 [IU] via SUBCUTANEOUS
  Filled 2017-08-04: qty 10

## 2017-08-04 MED ORDER — OXYCODONE-ACETAMINOPHEN 5-325 MG PO TABS
1.0000 | ORAL_TABLET | ORAL | Status: DC | PRN
  Administered 2017-08-04 – 2017-08-05 (×2): 2 via ORAL
  Administered 2017-08-06: 1 via ORAL
  Filled 2017-08-04 (×2): qty 2

## 2017-08-04 MED ORDER — ACETAMINOPHEN 325 MG PO TABS
650.0000 mg | ORAL_TABLET | ORAL | Status: DC | PRN
  Filled 2017-08-04: qty 2

## 2017-08-04 NOTE — H&P (Signed)
History and Physical    [name]:[id number] DOB: [birth year]  DOA: 08/04/2017 PCP: Nolene Ebbs, MD  Patient coming from: Home  Chief Complaint: Chest pain and headache  HPI: Alexis [name] is a 72 y.o. female with medical history significant of CAD status post stent in 2005, CHF, end-stage renal disease on dialysis, diabetes type 2, hypertension who presented to the emergency department complaining of sudden onset of sharp nonradiating left-sided chest pain that awake her from sleeping at 2:30 AM.  Patient reports associated symptoms were nausea and one episode of vomiting.  She felt that could have been GERD but was scared because he felt the same as when she had a heart attack.  She denies palpitations, shortness of breath and diaphoresis.  Other symptoms include headache " like when I have migraines" patient reported that she slept better and the pain is down to her neck.  Denies dizziness, blurry vision and weakness.  Last echocardiogram in 2015 with EF of 45% and severe TR.  EKG with left bundle branch block which is chronic.   Patient on hemodialysis Tuesday, Thursdays and Saturday had dialysis session yesterday with no complaints.  ED Course: In the ED chest pain has improved, only complaining of headache.  EKG appears unchanged with left bundle branch block seen in prior EKG, initial troponin negative labs otherwise at baseline.   Review of Systems:   General: no changes in body weight, no fever chills or decrease in energy.  HEENT: no blurry vision, hearing changes or sore throat Respiratory: no dyspnea, coughing, wheezing CV: See HPI GI: no nausea, vomiting, abdominal pain, diarrhea, constipation GU: no dysuria, burning on urination, increased urinary frequency, hematuria  Ext:. No deformities,  Neuro: no unilateral weakness, numbness, or tingling, no vision change or hearing loss Skin: No rashes, lesions or wounds. MSK: No muscle spasm, no deformity, no  limitation of range of movement in spin Heme: No easy bruising.  Travel history: No recent long distant travel.   Past Medical History:  Diagnosis Date  . Arthritis   . Cancer [facility]) 2005    left breast  . CHF (congestive heart failure) ([place])   . Chronic kidney disease 03/2014   dialysis t/th/sa  . Coronary artery disease   . Diabetes mellitus    Type 2  . Diabetic nephropathy ([place]) 01-08-13  . Dyslipidemia 01-08-13  . Hyperlipidemia   . Hypertension   . Proteinuria 01-08-13  . Thyroid disease 01-08-13   Hyper-parathyroidism-secondary    Past Surgical History:  Procedure Laterality Date  . ABDOMINAL AORTAGRAM N/A 08/11/2014   Procedure: ABDOMINAL Maxcine Ham;  Surgeon: Angelia Mould, MD;  Location: [facility] CATH LAB;  Service: Cardiovascular;  Laterality: N/A;  . ABDOMINAL HYSTERECTOMY    . APPENDECTOMY    . Arm surgery     Left arm trauma  . AV FISTULA PLACEMENT Left 08/21/2014   Procedure: LEFT ARM ARTERIOVENOUS (AV) FISTULA CREATION ;  Surgeon: Angelia Mould, MD;  Location: [facility];  Service: Vascular;  Laterality: Left;  . BACK SURGERY    . CATARACT EXTRACTION W/PHACO Left 12/22/2014   Procedure: CATARACT EXTRACTION PHACO AND INTRAOCULAR LENS PLACEMENT (IOC);  Surgeon: Tonny Branch, MD;  Location: [facility];  Service: Ophthalmology;  Laterality: Left;  CDE 13.48  . CORONARY ANGIOPLASTY  12/19/2003   inferior wall hypokinesis. ef 50%  . dialysis catheter    . MASTECTOMY     Left  . PERIPHERAL VASCULAR CATHETERIZATION N/A 09/16/2015   Procedure: Fistulagram;  Surgeon: Rosetta Posner, MD;  Location: [place] CV LAB;  Service: Cardiovascular;  Laterality: N/A;  . SPINE SURGERY    . TONSILLECTOMY    . TRANSTHORACIC ECHOCARDIOGRAM  10/01/2010    Left ventricle: The cavity size was mildly dilated. Wall thickness was increased in a pattern of mild LVH. Systolic function was   mildly reduced. The estimated ejection fraction was in the range  of 45% to 50%.   . TUBAL LIGATION        reports that she quit smoking about 19 years ago. Her smoking use included cigarettes. She has a 50.00 pack-year smoking history. She has never used smokeless tobacco. She reports that she does not drink alcohol or use drugs.  Allergies  Allergen Reactions  . Olmesartan Other (See Comments)    Hyperkalemia    Family History  Problem Relation Age of Onset  . Breast cancer Mother        Died age 42  . Cancer Mother 69       Breast  . Heart disease Mother   . Hyperlipidemia Mother   . Hypertension Mother   . Heart attack Mother     Prior to Admission medications   Medication Sig Start Date End Date Taking? Authorizing Provider  AURYXIA 1 GM 210 MG(Fe) tablet Take 420 mg by mouth 3 (three) times daily with meals. 07/25/17  Yes [provider]  carvedilol (COREG) 25 MG tablet Take 25 mg by mouth 2 (two) times daily with a meal.   Yes [provider]  diclofenac sodium (VOLTAREN) 1 % GEL Apply 1 application topically 4 (four) times daily as needed (pain).  06/27/17  Yes [provider]  docusate sodium (COLACE) 100 MG capsule Take 100 mg by mouth daily as needed for mild constipation.  03/25/14  Yes [provider]  hydrALAZINE (APRESOLINE) 100 MG tablet Take 100 mg by mouth 3 (three) times daily.   Yes [provider]  hydrOXYzine (ATARAX/VISTARIL) 25 MG tablet Take 25 mg by mouth 2 (two) times daily as needed for itching.   Yes [provider]  insulin aspart protamine-insulin aspart (NOVOLOG 70/30) (70-30) 100 UNIT/ML injection Inject 20-25 Units into the skin 2 (two) times daily with a meal. 20 UNITS IN THE MORNING AND 25 UNITS AT BEDTIME   Yes [provider]  isosorbide mononitrate (IMDUR) 30 MG 24 hr tablet Take 30 mg by mouth daily.  08/03/15  Yes [provider]  nitroGLYCERIN (NITROSTAT) 0.4 MG SL tablet Place 0.4 mg under the tongue every 5 (five) minutes as needed for chest pain.   Yes [provider]    sevelamer carbonate (RENVELA) 800 MG tablet Take 800 mg by mouth 3 (three) times daily with meals.  03/25/14  Yes [provider]  tiZANidine (ZANAFLEX) 4 MG tablet Take 4 mg by mouth daily as needed for muscle spasms.  07/31/17  Yes [provider]    Physical Exam: Vitals:   08/04/17 1158 08/04/17 1159 08/04/17 1746  BP: (!) 158/66  (!) 153/79  Pulse: 85  89  Resp: 18  (!) 22  Temp: 98.6 F (37 C)    TempSrc: Oral    SpO2: 95%  97%  Weight:  109.3 kg (241 lb)   Height:  5\' 6"  (1.676 m)      Constitutional: NAD, calm, comfortable Eyes: PERRL, lids and conjunctivae normal ENMT: Mucous membranes are moist. Posterior pharynx clear of any exudate or lesions.Normal dentition.  Neck: normal,  supple, no masses, no thyromegaly Respiratory: clear to auscultation bilaterally, no wheezing, no crackles. Normal respiratory effort. No accessory muscle use.  Cardiovascular: Regular rate and rhythm, no murmurs / rubs / gallops. No extremity edema. 2+ pedal pulses. No carotid bruits.  Abdomen: no tenderness, no masses palpated. No hepatosplenomegaly. Bowel sounds positive.  Musculoskeletal: no clubbing / cyanosis. No joint deformity upper and lower extremities. Good ROM, no contractures. Normal muscle tone.  Skin: no rashes, lesions, ulcers. No induration Neurologic: CN 2-12 grossly intact. Sensation intact, DTR normal. Strength 5/5 in all 4.  Psychiatric: Normal judgment and insight. Alert and oriented x 3. Normal mood.    Labs on Admission: I have personally reviewed following labs and imaging studies  CBC: Recent Labs  Lab 08/04/17 1228  WBC 7.9  HGB 11.8*  HCT 36.4  MCV 81.8  PLT 465   Basic Metabolic Panel: Recent Labs  Lab 08/04/17 1228  NA 137  K 4.4  CL 98*  CO2 26  GLUCOSE 178*  BUN 35*  CREATININE 5.37*  CALCIUM 10.0   GFR: Estimated Creatinine Clearance: 12 mL/min (A) (by C-G formula based on SCr of 5.37 mg/dL (H)). Liver Function Tests: No  results for input(s): AST, ALT, ALKPHOS, BILITOT, PROT, ALBUMIN in the last 168 hours. No results for input(s): LIPASE, AMYLASE in the last 168 hours. No results for input(s): AMMONIA in the last 168 hours. Coagulation Profile: No results for input(s): INR, PROTIME in the last 168 hours. Cardiac Enzymes: No results for input(s): CKTOTAL, CKMB, CKMBINDEX, TROPONINI in the last 168 hours. BNP (last 3 results) No results for input(s): PROBNP in the last 8760 hours. HbA1C: No results for input(s): HGBA1C in the last 72 hours. CBG: No results for input(s): GLUCAP in the last 168 hours. Lipid Profile: No results for input(s): CHOL, HDL, LDLCALC, TRIG, CHOLHDL, LDLDIRECT in the last 72 hours. Thyroid Function Tests: No results for input(s): TSH, T4TOTAL, FREET4, T3FREE, THYROIDAB in the last 72 hours. Anemia Panel: No results for input(s): VITAMINB12, FOLATE, FERRITIN, TIBC, IRON, RETICCTPCT in the last 72 hours. Urine analysis:    Component Value Date/Time   COLORURINE YELLOW 08/19/2012 1126   APPEARANCEUR CLOUDY (A) 08/19/2012 1126   LABSPEC 1.018 08/19/2012 1126   PHURINE 5.0 08/19/2012 1126   GLUCOSEU NEGATIVE 08/19/2012 1126   HGBUR SMALL (A) 08/19/2012 1126   BILIRUBINUR NEGATIVE 08/19/2012 1126   KETONESUR NEGATIVE 08/19/2012 1126   PROTEINUR >300 (A) 08/19/2012 1126   UROBILINOGEN 0.2 08/19/2012 1126   NITRITE NEGATIVE 08/19/2012 1126   LEUKOCYTESUR SMALL (A) 08/19/2012 1126   Sepsis Labs: !!!!!!!!!!!!!!!!!!!!!!!!!!!!!!!!!!!!!!!!!!!! @LABRCNTIP (procalcitonin:4,lacticidven:4) )No results found for this or any previous visit (from the past 240 hour(s)).   Radiological Exams on Admission: Dg Chest 2 View  Result Date: 08/04/2017 CLINICAL DATA:  Chest pain and shortness of breath. Left arm numbness. EXAM: CHEST - 2 VIEW COMPARISON:  07/13/2016 FINDINGS: Postsurgical changes of the left chest wall/axilla. The cardiac silhouette is enlarged. Mediastinal contours appear intact.  Calcific atherosclerotic disease of the aorta. There is no evidence of focal airspace consolidation, pleural effusion or pneumothorax. Pulmonary vascular congestion. Osseous structures are without acute abnormality. Soft tissues are grossly normal. IMPRESSION: Moderately enlarged cardiac silhouette. Pulmonary vascular congestion. Calcific atherosclerotic disease of the aorta. Electronically Signed   By: Fidela Salisbury M.D.   On: 08/04/2017 12:49   EKG: Independently reviewed by me, LBBB unchanged from previous   Assessment/Plan: Chest pain r/o ACS Heart score of 5 Will place in observation, cycle cardiac  enzymes and EKG Obtain echocardiogram Cardiology has seen the patient and recommended if no obvious evidence of ischemia will not need further workup. Pain is atypical. Continue medical management nitro as needed and beta-blocker  Chronic systolic CHF Echo on 3491 with EF of 45% and diffuse hypokinesis BNP elevated, patient seems to be euvolemic at this time. Echocardiogram has been ordered Continue to monitor at this time, volume removal with dialysis Continue Coreg and Imdur  Headache ?  Migraine, patient reported has not had a migraine headache for over 10 years and pain today is very severe.  Will obtain CT head.  No neurologic findings on exam  Pain medication as needed  End-stage renal disease on dialysis TTS Nephrology has been consulted for dialysis treatment Patient will be admitted to [facility] Renal function and electrolytes at baseline  Diabetes mellitus type 2 Monitor CBGs, SSI Continue home dose insulin  DVT prophylaxis: Heparin SQ Code Status: Full code Family Communication: Husband at bedside Disposition Plan: Anticipate discharge to previous home environment.  Consults called: Cardiology, nephrology Admission status: Admit to [facility], telemetry observation  Chipper Oman MD [facility] Pager: Text Page via [email or web address]    [phone]  If 7PM-7AM, please contact night-coverage [email or web address] Password [facility]  08/04/2017, 5:56 PM

## 2017-08-04 NOTE — ED Notes (Signed)
ED TO INPATIENT HANDOFF REPORT  Name/Age/Gender Alexis [name] 72 y.o. female  Code Status    Code Status Orders  (From admission, onward)        Start     Ordered   08/04/17 1752  Full code  Continuous     08/04/17 1753    Code Status History    Date Active Date Inactive Code Status Order ID Comments User Context   08/11/2014 0934 08/11/2014 1834 Full Code [id number]  Angelia Mould, MD Inpatient   03/25/2011 1830 03/25/2011 1923 Full Code [id number]  Minus Breeding, MD ED      Home/SNF/Other Home  Chief Complaint chest/neck pain  Level of Care/Admitting Diagnosis ED Disposition    ED Disposition Condition [place]: [place] [100100]  Level of Care: Telemetry [5]  Diagnosis: Chest pain [326712]  Admitting Physician: Doreatha Lew [[id number]]  Attending Physician: Patrecia Pour, EDWIN [[id number]]  PT Class (Do Not Modify): Observation [104]  PT Acc Code (Do Not Modify): Observation [10022]       Medical History Past Medical History:  Diagnosis Date  . Arthritis   . Cancer [facility]) 2005    left breast  . CHF (congestive heart failure) ([place])   . Chronic kidney disease 03/2014   dialysis t/th/sa  . Coronary artery disease   . Diabetes mellitus    Type 2  . Diabetic nephropathy ([place]) 01-08-13  . Dyslipidemia 01-08-13  . Hyperlipidemia   . Hypertension   . Proteinuria 01-08-13  . Thyroid disease 01-08-13   Hyper-parathyroidism-secondary    Allergies Allergies  Allergen Reactions  . Olmesartan Other (See Comments)    Hyperkalemia    IV Location/Drains/Wounds Patient Lines/Drains/Airways Status   Active Line/Drains/Airways    Name:   Placement date:   Placement time:   Site:   Days:   Peripheral IV 08/21/14 Right Forearm   08/21/14    1034    Forearm   1079   Peripheral IV 08/04/17 Right Forearm   08/04/17    1636    Forearm   less than 1   Fistula / Graft Left Forearm Arteriovenous fistula   08/21/14     1407    Forearm   1079   Airway   08/21/14    1345     1079   Incision (Closed) 08/21/14 Arm Left   08/21/14    1412     1079   Incision (Closed) 12/22/14 Eye Left   12/22/14    0826     956          Labs/Imaging Results for orders placed or performed during the hospital encounter of 08/04/17 (from the past 48 hour(s))  Brain natriuretic peptide     Status: Abnormal   Collection Time: 08/04/17 12:27 PM  Result Value Ref Range   B Natriuretic Peptide 1,506.3 (H) 0.0 - 100.0 pg/mL    Comment: Performed at [facility], [place] [address], [place], [place] [address]  Basic metabolic panel     Status: Abnormal   Collection Time: 08/04/17 12:28 PM  Result Value Ref Range   Sodium 137 135 - 145 mmol/L   Potassium 4.4 3.5 - 5.1 mmol/L   Chloride 98 (L) 101 - 111 mmol/L   CO2 26 22 - 32 mmol/L   Glucose, Bld 178 (H) 65 - 99 mg/dL   BUN 35 (H) 6 - 20 mg/dL   Creatinine, Ser 5.37 (H) 0.44 -  1.00 mg/dL   Calcium 10.0 8.9 - 10.3 mg/dL   GFR calc non Af Amer 7 (L) >60 mL/min   GFR calc Af Amer 8 (L) >60 mL/min    Comment: (NOTE) The eGFR has been calculated using the CKD EPI equation. This calculation has not been validated in all clinical situations. eGFR's persistently <60 mL/min signify possible Chronic Kidney Disease.    Anion gap 13 5 - 15    Comment: Performed at [facility], [place] [address] [place], [place] [address]  CBC     Status: Abnormal   Collection Time: 08/04/17 12:28 PM  Result Value Ref Range   WBC 7.9 4.0 - 10.5 K/uL   RBC 4.45 3.87 - 5.11 MIL/uL   Hemoglobin 11.8 (L) 12.0 - 15.0 g/dL   HCT 36.4 36.0 - 46.0 %   MCV 81.8 78.0 - 100.0 fL   MCH 26.5 26.0 - 34.0 pg   MCHC 32.4 30.0 - 36.0 g/dL   RDW 14.3 11.5 - 15.5 %   Platelets 190 150 - 400 K/uL    Comment: Performed at [facility], [place] [address] [place], [place] [address]  I-stat troponin, ED     Status: None   Collection Time: 08/04/17 12:35 PM  Result  Value Ref Range   Troponin i, poc 0.06 0.00 - 0.08 ng/mL   Comment 3            Comment: Due to the release kinetics of cTnI, a negative result within the first hours of the onset of symptoms does not rule out myocardial infarction with certainty. If myocardial infarction is still suspected, repeat the test at appropriate intervals.   D-dimer, quantitative (not at [facility])     Status: Abnormal   Collection Time: 08/04/17  4:38 PM  Result Value Ref Range   D-Dimer, Quant 2.84 (H) 0.00 - 0.50 ug/mL-FEU    Comment: (NOTE) At the manufacturer cut-off of 0.50 ug/mL FEU, this assay has been documented to exclude PE with a sensitivity and negative predictive value of 97 to 99%.  At this time, this assay has not been approved by the FDA to exclude DVT/VTE. Results should be correlated with clinical presentation. Performed at [facility], [place] [address], [place], [place] [address]   I-stat troponin, ED     Status: None   Collection Time: 08/04/17  4:56 PM  Result Value Ref Range   Troponin i, poc 0.02 0.00 - 0.08 ng/mL   Comment 3            Comment: Due to the release kinetics of cTnI, a negative result within the first hours of the onset of symptoms does not rule out myocardial infarction with certainty. If myocardial infarction is still suspected, repeat the test at appropriate intervals.    Dg Chest 2 View  Result Date: 08/04/2017 CLINICAL DATA:  Chest pain and shortness of breath. Left arm numbness. EXAM: CHEST - 2 VIEW COMPARISON:  07/13/2016 FINDINGS: Postsurgical changes of the left chest wall/axilla. The cardiac silhouette is enlarged. Mediastinal contours appear intact. Calcific atherosclerotic disease of the aorta. There is no evidence of focal airspace consolidation, pleural effusion or pneumothorax. Pulmonary vascular congestion. Osseous structures are without acute abnormality. Soft tissues are grossly normal. IMPRESSION: Moderately enlarged cardiac  silhouette. Pulmonary vascular congestion. Calcific atherosclerotic disease of the aorta. Electronically Signed   By: Fidela Salisbury M.D.   On: 08/04/2017 12:49   Ct Head Wo Contrast  Result Date:  08/04/2017 CLINICAL DATA:  Right-sided head pain beginning yesterday. EXAM: CT HEAD WITHOUT CONTRAST TECHNIQUE: Contiguous axial images were obtained from the base of the skull through the vertex without intravenous contrast. COMPARISON:  CT head without contrast 09/28/2016 FINDINGS: Brain: Mild atrophy and white matter changes are stable. No acute infarct, hemorrhage, or mass lesion is present. The ventricles are proportionate to the degree of atrophy. No significant extra-axial fluid collection is present. Vascular: Dense atherosclerotic calcifications are present within the cavernous internal carotid arteries bilaterally. There is no hyperdense vessel. Calcifications are present at the dural margin of both vertebral arteries. Skull: Calvarium is intact. No focal lytic or blastic lesions are present. No significant extracranial soft tissue injury is evident. Sinuses/Orbits: The paranasal sinuses and mastoid air cells are clear. A left lens replacement is again noted. Globes and orbits are otherwise normal. IMPRESSION: 1. No acute intracranial abnormality. 2. Stable atrophy and white matter disease. This likely reflects the sequela of chronic microvascular ischemia. Electronically Signed   By: San Morelle M.D.   On: 08/04/2017 19:39    Pending Labs Unresulted Labs (From admission, onward)   None      Vitals/Pain Today's Vitals   08/04/17 1213 08/04/17 1746 08/04/17 1815 08/04/17 1830  BP:  (!) 153/79  (!) 155/60  Pulse:  89 87   Resp:  (!) _0 Temp:      TempSrc:      SpO2:  97% 98%   Weight:      Height:      PainSc: 6        Isolation Precautions No active isolations  Medications Medications  ferric citrate (AURYXIA) tablet 420 mg (has no administration in time range)   carvedilol (COREG) tablet 25 mg (has no administration in time range)  docusate sodium (COLACE) capsule 100 mg (has no administration in time range)  hydrALAZINE (APRESOLINE) tablet 100 mg (has no administration in time range)  hydrOXYzine (ATARAX/VISTARIL) tablet 25 mg (has no administration in time range)  insulin aspart protamine- aspart (NOVOLOG MIX 70/30) injection 20-25 Units (has no administration in time range)  isosorbide mononitrate (IMDUR) 24 hr tablet 30 mg (has no administration in time range)  lidocaine (PF) (XYLOCAINE) 1 % injection 0.3 mL (has no administration in time range)  nitroGLYCERIN (NITROSTAT) SL tablet 0.4 mg (has no administration in time range)  sevelamer carbonate (RENVELA) tablet 800 mg (has no administration in time range)  tiZANidine (ZANAFLEX) tablet 4 mg (has no administration in time range)  acetaminophen (TYLENOL) tablet 650 mg (has no administration in time range)  ondansetron (ZOFRAN) injection 4 mg (has no administration in time range)  heparin injection 5,000 Units (has no administration in time range)  gi cocktail (Maalox,Lidocaine,Donnatal) (has no administration in time range)  oxyCODONE-acetaminophen (PERCOCET/ROXICET) 5-325 MG per tablet 1-2 tablet (has no administration in time range)  insulin aspart (novoLOG) injection 0-15 Units (has no administration in time range)  acetaminophen (TYLENOL) tablet 1,000 mg (1,000 mg Oral Given 08/04/17 1624)    Mobility walks with person assist

## 2017-08-04 NOTE — ED Provider Notes (Signed)
[facility] DEPT Provider Note   CSN: [id number] Arrival date & time: 08/04/17  1145     History   Chief Complaint Chief Complaint  Patient presents with  . Chest Pain    HPI Alexis [name] is a 72 y.o. female with past medical history significant for CAD, CHF, previous MI, CKD, on dialysis, DM, hypertension, presenting with sudden onset sharp nonradiating left-sided chest pain and shortness of breath starting at 230 this morning.  She also reports associated nausea and one episode of vomiting.  She is reporting that her pain has significantly improved while waiting in the emergency department.  She also states that she has a headache that started last night and states that she has a history of same "migraine" headaches.  She has taken Aleve with significant relief of the headache is starting to come back now.  Denies any trauma, fall or anticoagulant use. She had reported left-sided numbness and pain in her left arm to triage which has been chronic and she is being followed for this.  She added that she woke up with a "kink" in her right neck and pain when she turns her head to the right now.  Denies any new numbness or weakness or heaviness in her extremities.  Reports chronic lower extremity edema, but notes that the right lower extremity seemed more swollen than the left.  Dialysis Tuesday, Thursday, Saturday.  Patient had dialysis session yesterday.  Patient reports still making urine.  HPI  Past Medical History:  Diagnosis Date  . Arthritis   . Cancer [facility]) 2005    left breast  . Chest pain   . CHF (congestive heart failure) ([place])   . Chronic kidney disease 03/2014   dialysis t/th/sa  . Coronary artery disease   . Diabetes mellitus    Type 2  . Diabetic nephropathy ([place]) 01-08-13  . Dyslipidemia 01-08-13  . Hx of breast cancer   . Hx: UTI (urinary tract infection) 01-08-13  . Hyperlipidemia   . Hypertension   . 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 Morbid obesity ([place])   . Proteinuria 01-08-13  . SOB (shortness of breath)   . Thyroid disease 01-08-13   Hyper-parathyroidism-secondary    Patient Active Problem List   Diagnosis Date Noted  . 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 Essential hypertension 08/15/2012  . CAD (coronary artery disease) 03/25/2011  . Diabetes mellitus 03/25/2011  . Chest pain 03/25/2011  . CHF (congestive heart failure) ([place]) 10/14/2010  . CKD (chronic kidney disease) 10/14/2010  . Obesity 10/14/2010    Past Surgical History:  Procedure Laterality Date  . ABDOMINAL AORTAGRAM N/A 08/11/2014   Procedure: ABDOMINAL Maxcine Ham;  Surgeon: Angelia Mould, MD;  Location: [facility] CATH LAB;  Service: Cardiovascular;  Laterality: N/A;  . ABDOMINAL HYSTERECTOMY    . APPENDECTOMY    . Arm surgery     Left arm trauma  . AV FISTULA PLACEMENT Left 08/21/2014   Procedure: LEFT ARM ARTERIOVENOUS (AV) FISTULA CREATION ;  Surgeon: Angelia Mould, MD;  Location: [place];  Service: Vascular;  Laterality: Left;  . BACK SURGERY    . CARDIAC CATHETERIZATION  2010  . CATARACT EXTRACTION W/PHACO Left 12/22/2014   Procedure: CATARACT EXTRACTION PHACO AND INTRAOCULAR LENS PLACEMENT (IOC);  Surgeon: Tonny Branch, MD;  Location: [facility];  Service: Ophthalmology;  Laterality: Left;  CDE 13.48  . CORONARY ANGIOPLASTY  12/19/2003   inferior wall  hypokinesis. ef 50%  . dialysis catheter    . MASTECTOMY     Left  . PERIPHERAL VASCULAR CATHETERIZATION N/A 09/16/2015   Procedure: Fistulagram;  Surgeon: Rosetta Posner, MD;  Location: [place] CV LAB;  Service: Cardiovascular;  Laterality: N/A;  . SPINE SURGERY    . TONSILLECTOMY    . TRANSTHORACIC ECHOCARDIOGRAM  10/01/2010    Left ventricle: The cavity size was mildly dilated. Wall thickness was increased in a pattern of mild LVH. Systolic function was   mildly reduced. The estimated ejection fraction was in the range  of 45% to 50%.   . TUBAL  LIGATION       OB History   None      Home Medications    Prior to Admission medications   Medication Sig Start Date End Date Taking? Authorizing Provider  AURYXIA 1 GM 210 MG(Fe) tablet Take 420 mg by mouth 3 (three) times daily with meals. 07/25/17  Yes [provider]  carvedilol (COREG) 25 MG tablet Take 25 mg by mouth 2 (two) times daily with a meal.   Yes [provider]  diclofenac sodium (VOLTAREN) 1 % GEL Apply 1 application topically 4 (four) times daily as needed (pain).  06/27/17  Yes [provider]  docusate sodium (COLACE) 100 MG capsule Take 100 mg by mouth daily as needed for mild constipation.  03/25/14  Yes [provider]  hydrALAZINE (APRESOLINE) 100 MG tablet Take 100 mg by mouth 3 (three) times daily.   Yes [provider]  hydrOXYzine (ATARAX/VISTARIL) 25 MG tablet Take 25 mg by mouth 2 (two) times daily as needed for itching.   Yes [provider]  insulin aspart protamine-insulin aspart (NOVOLOG 70/30) (70-30) 100 UNIT/ML injection Inject 20-25 Units into the skin 2 (two) times daily with a meal. 20 UNITS IN THE MORNING AND 25 UNITS AT BEDTIME   Yes [provider]  isosorbide mononitrate (IMDUR) 30 MG 24 hr tablet Take 30 mg by mouth daily.  08/03/15  Yes [provider]  nitroGLYCERIN (NITROSTAT) 0.4 MG SL tablet Place 0.4 mg under the tongue every 5 (five) minutes as needed for chest pain.   Yes [provider]  sevelamer carbonate (RENVELA) 800 MG tablet Take 800 mg by mouth 3 (three) times daily with meals.  03/25/14  Yes [provider]  tiZANidine (ZANAFLEX) 4 MG tablet Take 4 mg by mouth daily as needed for muscle spasms.  07/31/17  Yes [provider]    Family History Family History  Problem Relation Age of Onset  . Breast cancer Mother        Died age 90  . Cancer Mother 60       Breast  . Heart disease Mother   . Hyperlipidemia Mother   . Hypertension  Mother   . Heart attack Mother     Social History Social History   Tobacco Use  . Smoking status: Former Smoker    Packs/day: 1.00    Years: 30.00    Pack years: 30.00    Types: Cigarettes    Last attempt to quit: 05/16/1998    Years since quitting: 19.2  . Smokeless tobacco: Never Used  Substance Use Topics  . Alcohol use: No    Alcohol/week: 0.0 oz  . Drug use: No     Allergies   Olmesartan   Review of Systems Review of Systems  Constitutional: Negative for chills, diaphoresis, fatigue and fever.  HENT: Positive for  ear pain. Negative for congestion, sore throat and trouble swallowing.   Eyes: Negative for photophobia, pain, redness and visual disturbance.  Respiratory: Positive for shortness of breath. Negative for cough, chest tightness, wheezing and stridor.   Cardiovascular: Positive for chest pain and leg swelling. Negative for palpitations.  Gastrointestinal: Positive for nausea and vomiting. Negative for abdominal distention, abdominal pain, constipation and diarrhea.  Genitourinary: Negative for dysuria, flank pain and hematuria.  Musculoskeletal: Positive for arthralgias, myalgias and neck pain. Negative for back pain and neck stiffness.  Skin: Negative for color change, pallor and rash.  Neurological: Positive for headaches. Negative for dizziness, tremors, facial asymmetry, speech difficulty, weakness, light-headedness and numbness.       Same as prior headaches, relieved by otc meds  Psychiatric/Behavioral: Negative for behavioral problems.     Physical Exam Updated Vital Signs BP (!) 158/66 (BP Location: Right Arm)   Pulse 85   Temp 98.6 F (37 C) (Oral)   Resp 18   Ht 5\' 6"  (1.676 m)   Wt 109.3 kg (241 lb)   SpO2 95%   BMI 38.90 kg/m   Physical Exam  Constitutional: She is oriented to person, place, and time. She appears well-developed and well-nourished.  Non-toxic appearance. She does not appear ill. No distress.  Afebrile, nontoxic appearing  in no acute distress, sitting comfortably in bed  HENT:  Head: Atraumatic.  Eyes: Pupils are equal, round, and reactive to light. EOM are normal.  Neck: Normal range of motion. Neck supple. No JVD present.  ttp of right trapezius muscle   Cardiovascular: Normal rate, regular rhythm and normal pulses.  Murmur heard.  Systolic murmur is present. Pulses:      Radial pulses are 2+ on the right side, and 2+ on the left side.  Pulmonary/Chest: Effort normal and breath sounds normal. No accessory muscle usage or stridor. No tachypnea. No respiratory distress. She has no decreased breath sounds.  Abdominal: Soft. She exhibits no distension, no ascites and no mass. There is no tenderness. There is no rebound and no guarding.  Musculoskeletal: Normal range of motion.       Right lower leg: Normal. She exhibits no tenderness and no edema.       Left lower leg: Normal. She exhibits no tenderness and no edema.  No LE edema  Neurological: She is alert and oriented to person, place, and time.  5/5 strength in extremities bilaterally  Skin: She is not diaphoretic. No pallor.  Psychiatric: She has a normal mood and affect. Her behavior is normal.  Nursing note and vitals reviewed.    ED Treatments / Results  Labs (all labs ordered are listed, but only abnormal results are displayed) Labs Reviewed  BASIC METABOLIC PANEL - Abnormal; Notable for the following components:      Result Value   Chloride 98 (*)    Glucose, Bld 178 (*)    BUN 35 (*)    Creatinine, Ser 5.37 (*)    GFR calc non Af Amer 7 (*)    GFR calc Af Amer 8 (*)    All other components within normal limits  CBC - Abnormal; Notable for the following components:   Hemoglobin 11.8 (*)    All other components within normal limits  BRAIN NATRIURETIC PEPTIDE  D-DIMER, QUANTITATIVE (NOT AT [facility])  I-STAT TROPONIN, ED  I-STAT TROPONIN, ED    EKG None  Radiology Dg Chest 2 View  Result Date: 08/04/2017 CLINICAL DATA:  Chest pain  and shortness  of breath. Left arm numbness. EXAM: CHEST - 2 VIEW COMPARISON:  07/13/2016 FINDINGS: Postsurgical changes of the left chest wall/axilla. The cardiac silhouette is enlarged. Mediastinal contours appear intact. Calcific atherosclerotic disease of the aorta. There is no evidence of focal airspace consolidation, pleural effusion or pneumothorax. Pulmonary vascular congestion. Osseous structures are without acute abnormality. Soft tissues are grossly normal. IMPRESSION: Moderately enlarged cardiac silhouette. Pulmonary vascular congestion. Calcific atherosclerotic disease of the aorta. Electronically Signed   By: Fidela Salisbury M.D.   On: 08/04/2017 12:49    Procedures Procedures (including critical care time)  Medications Ordered in ED Medications  acetaminophen (TYLENOL) tablet 1,000 mg (has no administration in time range)     Initial Impression / Assessment and Plan / ED Course  I have reviewed the triage vital signs and the nursing notes.  Pertinent labs & imaging results that were available during my care of the patient were reviewed by me and considered in my medical decision making (see chart for details).  Clinical Course as of Aug 05 1603  Fri Aug 05, 6071  1646 72 year old female with end-stage renal disease on dialysis had dialysis yesterday.  She states she infrequently gets chest pain and started again with chest pain and shortness of breath at 2 AM that is slowly improved and now resolved.  Initial workup is significant for a troponin of 0.06.  Currently she is complaining of a headache and she has a history of headaches in the past.  Her heart score would put her as an admission for continued workup of this chest pain.  The PA talked to the hospitalist who did not feel there would be any benefit for admission.  Will review with cardiology and get their input on this.   [MB]    Clinical Course User Index [MB] Hayden Rasmussen, MD   Patient significant past  medical history presented with left-sided sharp nonradiating chest pain with shortness of breath, nausea and vomiting at 230 this morning approximately 12 hours ago.  She now reports alleviation of her symptoms but started experiencing a recurrent headache.  Denies any nausea, chest pain, shortness of breath at this time.  Dialysis patient with session yesterday.  Chest x-ray with vascular congestion and cardiomegaly.  EKG appears unchanged from prior with left bundle branch block seen on prior EKG.  Initial troponin negative, labs otherwise baseline.  O2 sats 95% on room air.  Will order second troponin, BNP, dimer treat headache and reassess.  Will need to come in for cardiac rule out given her history. Last echo 08/21/2012: LVEF of 40-45% with diffuse hypokinesis Hearty score: 6  Called for admission and spoke to Dr. Debroah Baller who requested cardiology consult prior to considering admission.  Spoke to cardiology, Dr. Percival Spanish who will be seeing patient and discussing admission with hospitalist.  Spoke to Dr. Debroah Baller who will be admitting patient and putting in bed request.  Final Clinical Impressions(s) / ED Diagnoses   Final diagnoses:  Chest pain, unspecified type    ED Discharge Orders    None       Dossie Der 08/04/17 1738    Hayden Rasmussen, MD 08/04/17 1925

## 2017-08-04 NOTE — Consult Note (Signed)
Cardiology Consultation:   Patient ID: Alexis [name]; [id number]; 02-02-1946   Admit date: 08/04/2017 Date of Consult: 08/04/2017  Primary Care Provider: Nolene Ebbs, MD Primary Cardiologist: No primary care provider on file.  [facility]. Dr. Barbaraann [name]) Primary Electrophysiologist:     Patient Profile:   Alexis [name] is a 72 y.o. female with a hx of CAD who is being seen today for the evaluation of chest pain at the request of the the hospital service and Alexis Echevaria PA.  History of Present Illness:   Alexis [name] has a history of CAD.  She is seen at [facility].  There is a distant history (in 2005) of PCI/stent.  I have no details.  There is mention that this was an inferior MI. I did review notes in Care Everywhere.  The patient has had no recent stress testing.  She did have an echo last in 2015 and was found to have an EF of 45% with severe TR.  She has been managed medically.  She has medical problems as listed below and is on dialysis.  She lives in [place].  She reports that she developed sharp pain under her left breast last night.  It would come and go. She thinks that it is like her previous heart pain.  She said it came at rest.  There was no radiation.  It was not worse with position or breathing.  There was some associated nausea and mild SOB.  The pain would come and go.  She went to the primary care office but could not be seen and was told to present to the ED.  In the ED she has a chronic LBBB.  Initial enzymes are negative.  The hospitalist service was called for admission.  We have been called for consultation.  When I enter the room the patient is tearful.  Her complaint currently is not chest pain but a severe headache that is running up the back of her neck on the right side and radiating to the top of her head.  She has not had headaches like this before.    At baseline, she is limited by back pain.  She sounds like she is sedentary.  She has not had recent  cardiovascular symptoms   Past Medical History:  Diagnosis Date  . Arthritis   . Cancer [facility]) 2005    left breast  . Chest pain   . CHF (congestive heart failure) ([place])   . Chronic kidney disease 03/2014   dialysis t/th/sa  . Coronary artery disease   . Diabetes mellitus    Type 2  . Diabetic nephropathy ([place]) 01-08-13  . Dyslipidemia 01-08-13  . Hx of breast cancer   . Hx: UTI (urinary tract infection) 01-08-13  . Hyperlipidemia   . Hypertension   . 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 Morbid obesity ([place])   . Proteinuria 01-08-13  . SOB (shortness of breath)   . Thyroid disease 01-08-13   Hyper-parathyroidism-secondary    Past Surgical History:  Procedure Laterality Date  . ABDOMINAL AORTAGRAM N/A 08/11/2014   Procedure: ABDOMINAL Maxcine Ham;  Surgeon: Angelia Mould, MD;  Location: [facility] CATH LAB;  Service: Cardiovascular;  Laterality: N/A;  . ABDOMINAL HYSTERECTOMY    . APPENDECTOMY    . Arm surgery     Left arm trauma  . AV FISTULA PLACEMENT Left 08/21/2014   Procedure: LEFT ARM ARTERIOVENOUS (AV) FISTULA CREATION ;  Surgeon: Angelia Mould, MD;  Location: [facility] OR;  Service: Vascular;  Laterality: Left;  . BACK SURGERY    . CARDIAC CATHETERIZATION  2010  . CATARACT EXTRACTION W/PHACO Left 12/22/2014   Procedure: CATARACT EXTRACTION PHACO AND INTRAOCULAR LENS PLACEMENT (IOC);  Surgeon: Tonny Branch, MD;  Location: [facility] ORS;  Service: Ophthalmology;  Laterality: Left;  CDE 13.48  . CORONARY ANGIOPLASTY  12/19/2003   inferior wall hypokinesis. ef 50%  . dialysis catheter    . MASTECTOMY     Left  . PERIPHERAL VASCULAR CATHETERIZATION N/A 09/16/2015   Procedure: Fistulagram;  Surgeon: Rosetta Posner, MD;  Location: [place] CV LAB;  Service: Cardiovascular;  Laterality: N/A;  . SPINE SURGERY    . TONSILLECTOMY    . TRANSTHORACIC ECHOCARDIOGRAM  10/01/2010    Left ventricle: The cavity size was mildly dilated. Wall thickness was increased in a pattern of mild LVH. Systolic  function was   mildly reduced. The estimated ejection fraction was in the range  of 45% to 50%.   . TUBAL LIGATION       Prior to Admission medications   Medication Sig Start Date End Date Taking? Authorizing Provider  AURYXIA 1 GM 210 MG(Fe) tablet Take 420 mg by mouth 3 (three) times daily with meals. 07/25/17  Yes [provider]  carvedilol (COREG) 25 MG tablet Take 25 mg by mouth 2 (two) times daily with a meal.   Yes [provider]  diclofenac sodium (VOLTAREN) 1 % GEL Apply 1 application topically 4 (four) times daily as needed (pain).  06/27/17  Yes [provider]  docusate sodium (COLACE) 100 MG capsule Take 100 mg by mouth daily as needed for mild constipation.  03/25/14  Yes [provider]  hydrALAZINE (APRESOLINE) 100 MG tablet Take 100 mg by mouth 3 (three) times daily.   Yes [provider]  hydrOXYzine (ATARAX/VISTARIL) 25 MG tablet Take 25 mg by mouth 2 (two) times daily as needed for itching.   Yes [provider]  insulin aspart protamine-insulin aspart (NOVOLOG 70/30) (70-30) 100 UNIT/ML injection Inject 20-25 Units into the skin 2 (two) times daily with a meal. 20 UNITS IN THE MORNING AND 25 UNITS AT BEDTIME   Yes [provider]  isosorbide mononitrate (IMDUR) 30 MG 24 hr tablet Take 30 mg by mouth daily.  08/03/15  Yes [provider]  nitroGLYCERIN (NITROSTAT) 0.4 MG SL tablet Place 0.4 mg under the tongue every 5 (five) minutes as needed for chest pain.   Yes [provider]  sevelamer carbonate (RENVELA) 800 MG tablet Take 800 mg by mouth 3 (three) times daily with meals.  03/25/14  Yes [provider]  tiZANidine (ZANAFLEX) 4 MG tablet Take 4 mg by mouth daily as needed for muscle spasms.  07/31/17  Yes [provider]     Allergies:    Allergies  Allergen Reactions  . Olmesartan Other (See Comments)    Hyperkalemia    Social History:   Social History    Socioeconomic History  . Marital status: Legally Separated    Spouse name: Not on file  . Number of children: 3  . Years of education: Not on file  . Highest education level: Not on file  Occupational History  . Occupation: Retired  Scientific laboratory technician  . Financial resource strain: Not on file  . Food insecurity:    Worry: Not on file    Inability: Not on file  . Transportation needs:    Medical: Not on file  Non-medical: Not on file  Tobacco Use  . Smoking status: Former Smoker    Packs/day: 1.00    Years: 30.00    Pack years: 30.00    Types: Cigarettes    Last attempt to quit: 05/16/1998    Years since quitting: 19.2  . Smokeless tobacco: Never Used  Substance and Sexual Activity  . Alcohol use: No    Alcohol/week: 0.0 oz  . Drug use: No  . Sexual activity: Yes    Birth control/protection: Post-menopausal  Lifestyle  . Physical activity:    Days per week: Not on file    Minutes per session: Not on file  . Stress: Not on file  Relationships  . Social connections:    Talks on phone: Not on file    Gets together: Not on file    Attends religious service: Not on file    Active member of club or organization: Not on file    Attends meetings of clubs or organizations: Not on file    Relationship status: Not on file  . Intimate partner violence:    Fear of current or ex partner: Not on file    Emotionally abused: Not on file    Physically abused: Not on file    Forced sexual activity: Not on file  Other Topics Concern  . Not on file  Social History Narrative   Three grandchildren and a one year old great grandchild    Family History:    Family History  Problem Relation Age of Onset  . Breast cancer Mother        Died age 67  . Cancer Mother 61       Breast  . Heart disease Mother   . Hyperlipidemia Mother   . Hypertension Mother   . Heart attack Mother      ROS:  Please see the history of present illness.  ROS  Positive for constipation. All other ROS  reviewed and negative.     Physical Exam/Data:   Vitals:   08/04/17 1158 08/04/17 1159  BP: (!) 158/66   Pulse: 85   Resp: 18   Temp: 98.6 F (37 C)   TempSrc: Oral   SpO2: 95%   Weight:  241 lb (109.3 kg)  Height:  5\' 6"  (1.676 m)   No intake or output data in the 24 hours ending 08/04/17 1650 Filed Weights   08/04/17 1159  Weight: 241 lb (109.3 kg)   Body mass index is 38.9 kg/m.  GENERAL:  Well appearing HEENT:   Pupils equal round and reactive, fundi not visualized, oral mucosa unremarkable. Edentulous NECK:  No  jugular venous distention, waveform within normal limits, carotid upstroke brisk and symmetric, no bruits, no thyromegaly LYMPHATICS:  No cervical, inguinal adenopathy LUNGS:   Clear to auscultation bilaterally BACK:  No CVA tenderness CHEST:   Left mastectomy HEART:  PMI not displaced or sustained,S1 and S2 within normal limits, no S3, no S4, no clicks, no rubs, flow murmur systolic at the left upper sternal border, no diastolic murmurs ABD:  Flat, positive bowel sounds normal in frequency in pitch, no bruits, no rebound, no guarding, no midline pulsatile mass, no hepatomegaly, no splenomegaly EXT:  2 plus pulses upper and absent DP/PT bilateral, no edema, no cyanosis no clubbing, dialysis fistula left arm SKIN:  No rashes no nodules NEURO:   Cranial nerves II through XII grossly intact, motor grossly intact throughout PSYCH:    Cognitively intact, oriented to person place and  time   EKG:  The EKG was personally reviewed and demonstrates:  NSR, rate 86, LBBB, PACs, LAD. LBBB is chronic  Telemetry:  Telemetry was personally reviewed and demonstrates:  NA  Relevant CV Studies:   Laboratory Data:  Chemistry Recent Labs  Lab 08/04/17 1228  NA 137  K 4.4  CL 98*  CO2 26  GLUCOSE 178*  BUN 35*  CREATININE 5.37*  CALCIUM 10.0  GFRNONAA 7*  GFRAA 8*  ANIONGAP 13    No results for input(s): PROT, ALBUMIN, AST, ALT, ALKPHOS, BILITOT in the last 168  hours. Hematology Recent Labs  Lab 08/04/17 1228  WBC 7.9  RBC 4.45  HGB 11.8*  HCT 36.4  MCV 81.8  MCH 26.5  MCHC 32.4  RDW 14.3  PLT 190   Cardiac EnzymesNo results for input(s): TROPONINI in the last 168 hours.  Recent Labs  Lab 08/04/17 1235  TROPIPOC 0.06    BNPNo results for input(s): BNP, PROBNP in the last 168 hours.  DDimer No results for input(s): DDIMER in the last 168 hours.  Radiology/Studies:  Dg Chest 2 View  Result Date: 08/04/2017 CLINICAL DATA:  Chest pain and shortness of breath. Left arm numbness. EXAM: CHEST - 2 VIEW COMPARISON:  07/13/2016 FINDINGS: Postsurgical changes of the left chest wall/axilla. The cardiac silhouette is enlarged. Mediastinal contours appear intact. Calcific atherosclerotic disease of the aorta. There is no evidence of focal airspace consolidation, pleural effusion or pneumothorax. Pulmonary vascular congestion. Osseous structures are without acute abnormality. Soft tissues are grossly normal. IMPRESSION: Moderately enlarged cardiac silhouette. Pulmonary vascular congestion. Calcific atherosclerotic disease of the aorta. Electronically Signed   By: Fidela Salisbury M.D.   On: 08/04/2017 12:49    Assessment and Plan:   CHEST PAIN:   HEART SCORE of 5. Admit for observation and enzymes to be cycled. The HEART score is elevated secondary to the baseline abnormal EKG, previous risk factors and previous history.  However, the pain is atypical.  If she has no objective evidence of enzyme elevation (clear enzyme trend) I would not suggest that further in patient ischemia work up would be needed as her pain is atypical.   HEADACHE:  Per the primary team.    ELEVATED BNP:  Clinically she seems to be euvolemic.  However, there was some vascular congestion on CXR. Volume is managed via dialysis.    CARDIOMEGALY:   I do not strongly suspect pericardial effusion or pericarditis. However, I will order an echocardiogram to evaluate the enlarged  cardiac silhouette on CXR.    For questions or updates, please contact [place] Please consult [email or web address] for contact info under Cardiology/STEMI.   Signed, Minus Breeding, MD  08/04/2017 4:50 PM

## 2017-08-04 NOTE — ED Triage Notes (Addendum)
Patient c/o left chest pain, and SOB at 0230 today. Patient c/o numbness of th left arm. Patient has a shunt present to the left arm. Patient had dialysis yesterday. Patient states she had ran out of NTG 0.4 mg tabs and did not take one today.

## 2017-08-05 ENCOUNTER — Observation Stay (HOSPITAL_BASED_OUTPATIENT_CLINIC_OR_DEPARTMENT_OTHER): Payer: Medicare Other

## 2017-08-05 DIAGNOSIS — I10 Essential (primary) hypertension: Secondary | ICD-10-CM

## 2017-08-05 DIAGNOSIS — I255 Ischemic cardiomyopathy: Secondary | ICD-10-CM | POA: Diagnosis not present

## 2017-08-05 DIAGNOSIS — R0789 Other chest pain: Secondary | ICD-10-CM

## 2017-08-05 DIAGNOSIS — R072 Precordial pain: Secondary | ICD-10-CM | POA: Diagnosis not present

## 2017-08-05 DIAGNOSIS — R0602 Shortness of breath: Secondary | ICD-10-CM | POA: Diagnosis not present

## 2017-08-05 DIAGNOSIS — N186 End stage renal disease: Secondary | ICD-10-CM | POA: Diagnosis not present

## 2017-08-05 DIAGNOSIS — R51 Headache: Secondary | ICD-10-CM | POA: Diagnosis not present

## 2017-08-05 DIAGNOSIS — Z992 Dependence on renal dialysis: Secondary | ICD-10-CM

## 2017-08-05 DIAGNOSIS — M48062 Spinal stenosis, lumbar region with neurogenic claudication: Secondary | ICD-10-CM | POA: Diagnosis not present

## 2017-08-05 LAB — GLUCOSE, CAPILLARY
GLUCOSE-CAPILLARY: 124 mg/dL — AB (ref 65–99)
Glucose-Capillary: 143 mg/dL — ABNORMAL HIGH (ref 65–99)
Glucose-Capillary: 149 mg/dL — ABNORMAL HIGH (ref 65–99)
Glucose-Capillary: 102 mg/dL — ABNORMAL HIGH (ref 65–99)

## 2017-08-05 LAB — MRSA PCR SCREENING: MRSA by PCR: NEGATIVE

## 2017-08-05 LAB — ECHOCARDIOGRAM COMPLETE
Height: 66 in
Weight: 3811.2 oz

## 2017-08-05 MED ORDER — PENTAFLUOROPROP-TETRAFLUOROETH EX AERO: 1.0000 "application " | INHALATION_SPRAY | CUTANEOUS | Status: DC | PRN

## 2017-08-05 MED ORDER — SODIUM CHLORIDE 0.9 % IV SOLN: 100.0000 mL | INTRAVENOUS | Status: DC | PRN

## 2017-08-05 MED ORDER — HEPARIN SODIUM (PORCINE) 1000 UNIT/ML DIALYSIS
1000.0000 [IU] | INTRAMUSCULAR | Status: DC | PRN
  Filled 2017-08-05: qty 1

## 2017-08-05 MED ORDER — ASPIRIN EC 81 MG PO TBEC: 81.0000 mg | DELAYED_RELEASE_TABLET | Freq: Every day | ORAL | Status: DC

## 2017-08-05 MED ORDER — ALTEPLASE 2 MG IJ SOLR: 2.0000 mg | Freq: Once | INTRAMUSCULAR | Status: DC | PRN

## 2017-08-05 MED ORDER — LIDOCAINE-PRILOCAINE 2.5-2.5 % EX CREA
1.0000 "application " | TOPICAL_CREAM | CUTANEOUS | Status: DC | PRN
  Filled 2017-08-05: qty 5

## 2017-08-05 MED ORDER — ASPIRIN 81 MG PO TBEC: 81.0000 mg | DELAYED_RELEASE_TABLET | Freq: Every day | ORAL | 0 refills | Status: DC

## 2017-08-05 MED ORDER — LIDOCAINE HCL (PF) 1 % IJ SOLN: 5.0000 mL | INTRAMUSCULAR | Status: DC | PRN

## 2017-08-05 NOTE — Consult Note (Addendum)
[facility] Renal Consultation Note    Indication for Consultation:  Management of ESRD/hemodialysis, anemia, hypertension/volume, and secondary hyperparathyroidism.  HPI: Alexis [name] is a 72 y.o. female with ESRD, CAD (s/p DES ~15 years ago), CHF, Type 2 DM, hyperlipidemia who was admitted as OBS status with CP, headache, and dyspnea.  Labs unrevealing. CXR with questionable mild chf. EKG with LBBB, stable enzymes. She is due for dialysis today, typically dialyzes at [facility] on TTS schedule via LUE AVF. Has been on HD x 2.5 years.  No CP at this time, headache present still. No dyspnea. C/o L shoulder/neck pain which has been ongoing. No fever, chills, N/V, abdominal pain.  Past Medical History:  Diagnosis Date  . Arthritis   . Cancer [facility]) 2005    left breast  . CHF (congestive heart failure) ([place])   . Chronic kidney disease 03/2014   dialysis t/th/sa  . Coronary artery disease   . Diabetes mellitus    Type 2  . Diabetic nephropathy ([place]) 01-08-13  . Dyslipidemia 01-08-13  . Hyperlipidemia   . Hypertension   . Proteinuria 01-08-13  . Thyroid disease 01-08-13   Hyper-parathyroidism-secondary   Past Surgical History:  Procedure Laterality Date  . ABDOMINAL AORTAGRAM N/A 08/11/2014   Procedure: ABDOMINAL Maxcine Ham;  Surgeon: Angelia Mould, MD;  Location: [facility] CATH LAB;  Service: Cardiovascular;  Laterality: N/A;  . ABDOMINAL HYSTERECTOMY    . APPENDECTOMY    . Arm surgery     Left arm trauma  . AV FISTULA PLACEMENT Left 08/21/2014   Procedure: LEFT ARM ARTERIOVENOUS (AV) FISTULA CREATION ;  Surgeon: Angelia Mould, MD;  Location: [place];  Service: Vascular;  Laterality: Left;  . BACK SURGERY    . CATARACT EXTRACTION W/PHACO Left 12/22/2014   Procedure: CATARACT EXTRACTION PHACO AND INTRAOCULAR LENS PLACEMENT (IOC);  Surgeon: Tonny Branch, MD;  Location: [facility] ORS;  Service: Ophthalmology;  Laterality: Left;  CDE 13.48  . CORONARY ANGIOPLASTY  12/19/2003    inferior wall hypokinesis. ef 50%  . dialysis catheter    . MASTECTOMY     Left  . PERIPHERAL VASCULAR CATHETERIZATION N/A 09/16/2015   Procedure: Fistulagram;  Surgeon: Rosetta Posner, MD;  Location: [place] CV LAB;  Service: Cardiovascular;  Laterality: N/A;  . SPINE SURGERY    . TONSILLECTOMY    . TRANSTHORACIC ECHOCARDIOGRAM  10/01/2010    Left ventricle: The cavity size was mildly dilated. Wall thickness was increased in a pattern of mild LVH. Systolic function was   mildly reduced. The estimated ejection fraction was in the range  of 45% to 50%.   . TUBAL LIGATION     Family History  Problem Relation Age of Onset  . Breast cancer Mother        Died age 60  . Cancer Mother 59       Breast  . Heart disease Mother   . Hyperlipidemia Mother   . Hypertension Mother   . Heart attack Mother    Social History:  reports that she quit smoking about 19 years ago. Her smoking use included cigarettes. She has a 50.00 pack-year smoking history. She has never used smokeless tobacco. She reports that she does not drink alcohol or use drugs.  ROS: As per HPI otherwise negative.  Physical Exam: Vitals:   08/04/17 1815 08/04/17 1830 08/04/17 2008 08/05/17 0545  BP:  (!) 155/60 (!) 178/71 137/68  Pulse: 87  89 83  Resp: 17 14  16  Temp:   98.1 F (36.7 C) 98.5 F (36.9 C)  TempSrc:   Oral Oral  SpO2: 98%  100% 99%  Weight:   107.4 kg (236 lb 12.8 oz) 108 kg (238 lb 3.2 oz)  Height:         General: Well developed, well nourished, in no acute distress. Head: Normocephalic, atraumatic, sclera non-icteric, mucus membranes are moist. Neck: Supple without lymphadenopathy/masses.  Lungs: Clear bilaterally to auscultation without wheezes, rales, or rhonchi. Breathing is unlabored. Heart: RRR . AVF bruit heard throughout chest Musculoskeletal:  Strength and tone appear normal for age. Lower extremities: No edema or ischemic changes, no open wounds. Neuro: Alert and oriented X 3. Moves all  extremities spontaneously. Psych:  Responds to questions appropriately with a normal affect. Dialysis Access: LUE AVF + bruit  Allergies  Allergen Reactions  . Olmesartan Other (See Comments)    Hyperkalemia   Prior to Admission medications   Medication Sig Start Date End Date Taking? Authorizing Provider  AURYXIA 1 GM 210 MG(Fe) tablet Take 420 mg by mouth 3 (three) times daily with meals. 07/25/17  Yes [provider]  carvedilol (COREG) 25 MG tablet Take 25 mg by mouth 2 (two) times daily with a meal.   Yes [provider]  diclofenac sodium (VOLTAREN) 1 % GEL Apply 1 application topically 4 (four) times daily as needed (pain).  06/27/17  Yes [provider]  docusate sodium (COLACE) 100 MG capsule Take 100 mg by mouth daily as needed for mild constipation.  03/25/14  Yes [provider]  hydrALAZINE (APRESOLINE) 100 MG tablet Take 100 mg by mouth 3 (three) times daily.   Yes [provider]  hydrOXYzine (ATARAX/VISTARIL) 25 MG tablet Take 25 mg by mouth 2 (two) times daily as needed for itching.   Yes [provider]  insulin aspart protamine-insulin aspart (NOVOLOG 70/30) (70-30) 100 UNIT/ML injection Inject 20-25 Units into the skin 2 (two) times daily with a meal. 20 UNITS IN THE MORNING AND 25 UNITS AT BEDTIME   Yes [provider]  isosorbide mononitrate (IMDUR) 30 MG 24 hr tablet Take 30 mg by mouth daily.  08/03/15  Yes [provider]  nitroGLYCERIN (NITROSTAT) 0.4 MG SL tablet Place 0.4 mg under the tongue every 5 (five) minutes as needed for chest pain.   Yes [provider]  sevelamer carbonate (RENVELA) 800 MG tablet Take 800 mg by mouth 3 (three) times daily with meals.  03/25/14  Yes [provider]  tiZANidine (ZANAFLEX) 4 MG tablet Take 4 mg by mouth daily as needed for muscle spasms.  07/31/17  Yes [provider]   Current Facility-Administered Medications  Medication Dose  Route Frequency Provider Last Rate Last Dose  . acetaminophen (TYLENOL) tablet 650 mg  650 mg Oral Q4H PRN Patrecia Pour, Christean Grief, MD      . aspirin EC tablet 81 mg  81 mg Oral Daily Satira Sark, MD      . carvedilol (COREG) tablet 25 mg  25 mg Oral BID WC Patrecia Pour, Christean Grief, MD   25 mg at 08/05/17 1004  . docusate sodium (COLACE) capsule 100 mg  100 mg Oral Daily PRN Patrecia Pour, Christean Grief, MD      . ferric citrate (AURYXIA) tablet 420 mg  420 mg Oral TID WC Patrecia Pour, Christean Grief, MD   420 mg at 08/05/17 1017  . gi cocktail (Maalox,Lidocaine,Donnatal)  30 mL Oral QID PRN Patrecia Pour, Christean Grief, MD      .  heparin injection 5,000 Units  5,000 Units Subcutaneous Q8H Patrecia Pour, Christean Grief, MD   5,000 Units at 08/05/17 [phone]  . hydrALAZINE (APRESOLINE) tablet 100 mg  100 mg Oral TID Patrecia Pour, Christean Grief, MD   100 mg at 08/05/17 1005  . hydrOXYzine (ATARAX/VISTARIL) tablet 25 mg  25 mg Oral BID PRN Patrecia Pour, Christean Grief, MD   25 mg at 08/05/17 0302  . insulin aspart (novoLOG) injection 0-15 Units  0-15 Units Subcutaneous TID WC Patrecia Pour, Christean Grief, MD   2 Units at 08/05/17 1006  . insulin aspart protamine- aspart (NOVOLOG MIX 70/30) injection 20 Units  20 Units Subcutaneous QAC breakfast Patrecia Pour, Christean Grief, MD   20 Units at 08/05/17 1006  . insulin aspart protamine- aspart (NOVOLOG MIX 70/30) injection 25 Units  25 Units Subcutaneous Q supper Patrecia Pour, Christean Grief, MD      . isosorbide mononitrate (IMDUR) 24 hr tablet 30 mg  30 mg Oral Daily Patrecia Pour, Christean Grief, MD   30 mg at 08/05/17 1004  . nitroGLYCERIN (NITROSTAT) SL tablet 0.4 mg  0.4 mg Sublingual Q5 min PRN Patrecia Pour, Christean Grief, MD      . ondansetron [facility]) injection 4 mg  4 mg Intravenous Q6H PRN Patrecia Pour, Christean Grief, MD      . oxyCODONE-acetaminophen (PERCOCET/ROXICET) 5-325 MG per tablet 1-2 tablet  1-2 tablet Oral Q4H PRN Patrecia Pour, Christean Grief, MD   2 tablet at 08/05/17 1211  . sevelamer carbonate (RENVELA) tablet 800 mg  800 mg Oral TID WC Patrecia Pour,  Christean Grief, MD   800 mg at 08/05/17 1005  . tiZANidine (ZANAFLEX) tablet 4 mg  4 mg Oral Daily PRN Patrecia Pour, Christean Grief, MD       Labs: Basic Metabolic Panel: Recent Labs  Lab 08/04/17 1228  NA 137  K 4.4  CL 98*  CO2 26  GLUCOSE 178*  BUN 35*  CREATININE 5.37*  CALCIUM 10.0   CBC: Recent Labs  Lab 08/04/17 1228  WBC 7.9  HGB 11.8*  HCT 36.4  MCV 81.8  PLT 190   Studies/Results: Dg Chest 2 View  Result Date: 08/04/2017 CLINICAL DATA:  Chest pain and shortness of breath. Left arm numbness. EXAM: CHEST - 2 VIEW COMPARISON:  07/13/2016 FINDINGS: Postsurgical changes of the left chest wall/axilla. The cardiac silhouette is enlarged. Mediastinal contours appear intact. Calcific atherosclerotic disease of the aorta. There is no evidence of focal airspace consolidation, pleural effusion or pneumothorax. Pulmonary vascular congestion. Osseous structures are without acute abnormality. Soft tissues are grossly normal. IMPRESSION: Moderately enlarged cardiac silhouette. Pulmonary vascular congestion. Calcific atherosclerotic disease of the aorta. Electronically Signed   By: Fidela Salisbury M.D.   On: 08/04/2017 12:49   Ct Head Wo Contrast  Result Date: 08/04/2017 CLINICAL DATA:  Right-sided head pain beginning yesterday. EXAM: CT HEAD WITHOUT CONTRAST TECHNIQUE: Contiguous axial images were obtained from the base of the skull through the vertex without intravenous contrast. COMPARISON:  CT head without contrast 09/28/2016 FINDINGS: Brain: Mild atrophy and white matter changes are stable. No acute infarct, hemorrhage, or mass lesion is present. The ventricles are proportionate to the degree of atrophy. No significant extra-axial fluid collection is present. Vascular: Dense atherosclerotic calcifications are present within the cavernous internal carotid arteries bilaterally. There is no hyperdense vessel. Calcifications are present at the dural margin of both vertebral arteries. Skull: Calvarium  is intact. No focal lytic or blastic lesions are present. No significant extracranial soft tissue injury is evident. Sinuses/Orbits: The paranasal sinuses and mastoid air cells  are clear. A left lens replacement is again noted. Globes and orbits are otherwise normal. IMPRESSION: 1. No acute intracranial abnormality. 2. Stable atrophy and white matter disease. This likely reflects the sequela of chronic microvascular ischemia. Electronically Signed   By: San Morelle M.D.   On: 08/04/2017 19:39   Dialysis Orders:  TTS at [place] records pending.  Assessment/Plan: 1.  CP/ Hx CAD: Resolved now. Negative enzymes. Cardiology has evaluated, apparently had short run of VT. 2.  ESRD:  Usual TTS schedule, for short HD today and likely d/c afterwards. 3.  Hypertension/volume: BP variable. No edema on exam, CXR suspicious for fluid. CXR questionable vasc congestion, no edema.  On exam no sig vol overload. Plan HD later tonight.  4.  Anemia: Hgb 11.8, follow. 5.  Metabolic bone disease: Ca ok. Follow. 6.  Type 2 DM: per primary.  Veneta Penton, PA-C 08/05/2017, 12:44 PM  [facility] Pager: [phone]  Pt seen, examined, agree w assess/plan as above with additions as indicated.  Kelly Splinter MD [employer] pager [phone]    cell [phone] 08/05/2017, 3:29 PM

## 2017-08-05 NOTE — Plan of Care (Signed)
Plan d/c to home after Dialysis today.

## 2017-08-05 NOTE — Progress Notes (Signed)
Late entry 1600 Call received from Primary Rn, indicating that pt will be dc after HD, Explained that order was received at 1254 and second shift has been started. Due to increased census Dr Jonnie Finner has been prioritizing pts to be dialyzed

## 2017-08-05 NOTE — Discharge Summary (Signed)
Alexis [name], is a 72 y.o. female  DOB 1945-08-12  MRN [id number].  Admission date:  08/04/2017  Admitting Physician  Doreatha Lew, MD  Discharge Date:  08/05/2017   Primary MD  Nolene Ebbs, MD  Recommendations for primary care physician for things to follow:  CBC Consider outpatient CT angiogram as noted below   Admission Diagnosis  Spinal stenosis of lumbar region with neurogenic claudication [M48.062] Chest pain, unspecified type [R07.9]   Discharge Diagnosis  Spinal stenosis of lumbar region with neurogenic claudication [M48.062] Chest pain, unspecified type [R07.9]   Active Problems:   Chest pain      Past Medical History:  Diagnosis Date  . Arthritis   . Cancer [facility]) 2005    left breast  . CHF (congestive heart failure) ([place])   . Chronic kidney disease 03/2014   dialysis t/th/sa  . Coronary artery disease   . Diabetes mellitus    Type 2  . Diabetic nephropathy ([place]) 01-08-13  . Dyslipidemia 01-08-13  . Hyperlipidemia   . Hypertension   . Proteinuria 01-08-13  . Thyroid disease 01-08-13   Hyper-parathyroidism-secondary    Past Surgical History:  Procedure Laterality Date  . ABDOMINAL AORTAGRAM N/A 08/11/2014   Procedure: ABDOMINAL Maxcine Ham;  Surgeon: Angelia Mould, MD;  Location: [facility] CATH LAB;  Service: Cardiovascular;  Laterality: N/A;  . ABDOMINAL HYSTERECTOMY    . APPENDECTOMY    . Arm surgery     Left arm trauma  . AV FISTULA PLACEMENT Left 08/21/2014   Procedure: LEFT ARM ARTERIOVENOUS (AV) FISTULA CREATION ;  Surgeon: Angelia Mould, MD;  Location: [place];  Service: Vascular;  Laterality: Left;  . BACK SURGERY    . CATARACT EXTRACTION W/PHACO Left 12/22/2014   Procedure: CATARACT EXTRACTION PHACO AND INTRAOCULAR LENS PLACEMENT (I[facility]);  Surgeon: Tonny Branch, MD;  Location: [facility];  Service: Ophthalmology;  Laterality: Left;  CDE 13.48  . CORONARY  ANGIOPLASTY  12/19/2003   inferior wall hypokinesis. ef 50%  . dialysis catheter    . MASTECTOMY     Left  . PERIPHERAL VASCULAR CATHETERIZATION N/A 09/16/2015   Procedure: Fistulagram;  Surgeon: Rosetta Posner, MD;  Location: [place] [facility];  Service: Cardiovascular;  Laterality: N/A;  . SPINE SURGERY    . TONSILLECTOMY    . TRANSTHORACIC ECHOCARDIOGRAM  10/01/2010    Left ventricle: The cavity size was mildly dilated. Wall thickness was increased in a pattern of mild LVH. Systolic function was   mildly reduced. The estimated ejection fraction was in the range  of 45% to 50%.   . TUBAL LIGATION         HPI  from the history and physical done on the day of admission:  Alexis [name] is a 72 y.o. female with medical history significant of CAD status post stent in 2005, CHF, end-stage renal disease on dialysis, diabetes type 2, hypertension who presented to the emergency department complaining of sudden onset of sharp nonradiating left-sided chest pain that awake her  from sleeping at 2:30 AM.  Patient reports associated symptoms were nausea and one episode of vomiting.  She felt that could have been GERD but was scared because he felt the same as when she had a heart attack.  She denies palpitations, shortness of breath and diaphoresis.  Other symptoms include headache " like when I have migraines" patient reported that she slept better and the pain is down to her neck.  Denies dizziness, blurry vision and weakness.  Last echocardiogram in 2015 with EF of 45% and severe TR.  EKG with left bundle branch block which is chronic.   Patient on hemodialysis Tuesday, Thursdays and Saturday had dialysis session yesterday with no complaints.  ED Course: In the ED chest pain has improved, only complaining of headache.  EKG appears unchanged with left bundle branch block seen in prior EKG, initial troponin negative labs otherwise at baseline.        Hospital Course:    72 y.o. female with ESRD on  hemodialysis, CAD with history of previous inferior infarct and PCI, left bundle branch block, type 2 diabetes mellitus, ischemic cardiomyopathy, hyperlipidemia, and hypertension admitted for observation overnight after presenting with atypical chest pain.  Twelve-lead EKG negative for any acute coronary syndrome.  Serial cardiac enzymes was negative.  2D echocardiogram done 08/05/2017 noted EF of 40-45% with basal inferolateral inferior and inferoseptal akinesis consistent with pseudonormal left ventricular filling pattern, grade 2 diastolic dysfunction, peak systolic pressure of 73 mm.Hg.  Patient was seen in consultation by Dr. Lonia Mad of cardiology who recommended no further ischemic testing but to continue with outpatient cardiology follow-up.  It was his opinion that at least based on comparison with old reports in [place], RV dysfunction does look to be worse as well as degree of pulmonary hypertension, and felt that it would not be unreasonable to obtain a chest CTA in this case.  Patient is chest pain-free and would like to go home today, and therefore will be discharged home with continued outpatient follow-up,  with recommendation for consideration of obtaining outpatient CT angiogram as  recommended above       Discharge Condition: Stable  Follow UP-primary cardiologist and PCP     Consults obtained -cardiology  Diet and Activity recommendation:  As advised  Discharge Instructions     Discharge Instructions    Call MD for:   Complete by:  As directed    Call MD for:  difficulty breathing, headache or visual disturbances   Complete by:  As directed    Call MD for:  extreme fatigue   Complete by:  As directed    Call MD for:  hives   Complete by:  As directed    Call MD for:  persistant dizziness or light-headedness   Complete by:  As directed    Call MD for:  persistant nausea and vomiting   Complete by:  As directed    Call MD for:  redness,  tenderness, or signs of infection (pain, swelling, redness, odor or green/yellow discharge around incision site)   Complete by:  As directed    Call MD for:  severe uncontrolled pain   Complete by:  As directed    Call MD for:  temperature >100.4   Complete by:  As directed    Diet - low sodium heart healthy   Complete by:  As directed    Increase activity slowly   Complete by:  As directed  Discharge Medications     Allergies as of 08/05/2017      Reactions   Olmesartan Other (See Comments)   Hyperkalemia      Medication List    TAKE these medications   aspirin 81 MG EC tablet Take 1 tablet (81 mg total) by mouth daily.   AURYXIA 1 GM 210 MG(Fe) tablet Generic drug:  ferric citrate Take 420 mg by mouth 3 (three) times daily with meals.   carvedilol 25 MG tablet Commonly known as:  COREG Take 25 mg by mouth 2 (two) times daily with a meal.   diclofenac sodium 1 % Gel Commonly known as:  VOLTAREN Apply 1 application topically 4 (four) times daily as needed (pain).   docusate sodium 100 MG capsule Commonly known as:  COLACE Take 100 mg by mouth daily as needed for mild constipation.   hydrALAZINE 100 MG tablet Commonly known as:  APRESOLINE Take 100 mg by mouth 3 (three) times daily.   hydrOXYzine 25 MG tablet Commonly known as:  ATARAX/VISTARIL Take 25 mg by mouth 2 (two) times daily as needed for itching.   insulin aspart protamine- aspart (70-30) 100 UNIT/ML injection Commonly known as:  NOVOLOG MIX 70/30 Inject 20-25 Units into the skin 2 (two) times daily with a meal. 20 UNITS IN THE MORNING AND 25 UNITS AT BEDTIME   isosorbide mononitrate 30 MG 24 hr tablet Commonly known as:  IMDUR Take 30 mg by mouth daily.   nitroGLYCERIN 0.4 MG SL tablet Commonly known as:  NITROSTAT Place 0.4 mg under the tongue every 5 (five) minutes as needed for chest pain.   sevelamer carbonate 800 MG tablet Commonly known as:  RENVELA Take 800 mg by mouth 3  (three) times daily with meals.   tiZANidine 4 MG tablet Commonly known as:  ZANAFLEX Take 4 mg by mouth daily as needed for muscle spasms.       Major procedures and Radiology Reports - PLEASE review detailed and final reports for all details, in brief -     Dg Chest 2 View  Result Date: 08/04/2017 CLINICAL DATA:  Chest pain and shortness of breath. Left arm numbness. EXAM: CHEST - 2 VIEW COMPARISON:  07/13/2016 FINDINGS: Postsurgical changes of the left chest wall/axilla. The cardiac silhouette is enlarged. Mediastinal contours appear intact. Calcific atherosclerotic disease of the aorta. There is no evidence of focal airspace consolidation, pleural effusion or pneumothorax. Pulmonary vascular congestion. Osseous structures are without acute abnormality. Soft tissues are grossly normal. IMPRESSION: Moderately enlarged cardiac silhouette. Pulmonary vascular congestion. Calcific atherosclerotic disease of the aorta. Electronically Signed   By: Fidela Salisbury M.D.   On: 08/04/2017 12:49   Ct Head Wo Contrast  Result Date: 08/04/2017 CLINICAL DATA:  Right-sided head pain beginning yesterday. EXAM: CT HEAD WITHOUT CONTRAST TECHNIQUE: Contiguous axial images were obtained from the base of the skull through the vertex without intravenous contrast. COMPARISON:  CT head without contrast 09/28/2016 FINDINGS: Brain: Mild atrophy and white matter changes are stable. No acute infarct, hemorrhage, or mass lesion is present. The ventricles are proportionate to the degree of atrophy. No significant extra-axial fluid collection is present. Vascular: Dense atherosclerotic calcifications are present within the cavernous internal carotid arteries bilaterally. There is no hyperdense vessel. Calcifications are present at the dural margin of both vertebral arteries. Skull: Calvarium is intact. No focal lytic or blastic lesions are present. No significant extracranial soft tissue injury is evident. Sinuses/Orbits:  The paranasal sinuses and mastoid air cells are clear.  A left lens replacement is again noted. Globes and orbits are otherwise normal. IMPRESSION: 1. No acute intracranial abnormality. 2. Stable atrophy and white matter disease. This likely reflects the sequela of chronic microvascular ischemia. Electronically Signed   By: San Morelle M.D.   On: 08/04/2017 19:39    Micro Results   ------------------------------------------------------------------- Transthoracic Echocardiography  Patient:    Alexis [name], Alexis [name] MR #:       [id number] Study Date: 08/05/2017 Gender:     F Age:        49 Height:     167.6 cm Weight:     108 kg BSA:        2.29 m^2 Pt. Status: Room:       6E09C   REFERRING    Minus Breeding, MD  ATTENDING    Benito Mccreedy [id number]  PERFORMING   [facility], Inpatient  ADMITTING    Patrecia Pour, Edwin  ORDERING     Patrecia Pour, Edwin  REFERRING    Mitchell, Christean Grief  SONOGRAPHER  Chelsea Androw  cc:  ------------------------------------------------------------------- LV EF: 40% -   45%  ------------------------------------------------------------------- Indications:      Chest pain 786.51.  ------------------------------------------------------------------- History:   PMH:   Coronary artery disease.  Congestive heart failure.  Risk factors:  Hypertension. Diabetes mellitus. Obese. Dyslipidemia.  ------------------------------------------------------------------- Study Conclusions  - Left ventricle: The cavity size was at the upper limits of   normal. Wall thickness was increased in a pattern of mild LVH.   Systolic function was mildly to moderately reduced. The estimated   ejection fraction was in the range of 40% to 45%. There is   akinesis of the basalinferolateral, inferior, and inferoseptal   myocardium. Features are consistent with a pseudonormal left   ventricular filling pattern, with concomitant abnormal relaxation   and increased filling  pressure (grade 2 diastolic dysfunction). - Aortic valve: Mildly to moderately calcified annulus. Trileaflet;   mildly calcified leaflets. - Mitral valve: Mildly to moderately calcified annulus. Moderately   thickened, moderately calcified leaflets . There was moderate   regurgitation. - Left atrium: The atrium was mildly dilated. - Right ventricle: The cavity size was mildly dilated. Systolic   function was moderately reduced. - Right atrium: The atrium was mildly dilated. Central venous   pressure (est): 15 mm Hg. - Tricuspid valve: There was mild-moderate regurgitation. - Pulmonary arteries: Systolic pressure was severely increased. PA   peak pressure: 73 mm Hg (S). - Pericardium, extracardiac: There was no pericardial effusion.  ------------------------------------------------------------------- Study data:  Comparison was made to the study of 08/21/2012.  Study status:  Routine.  Procedure:  The patient reported no pain pre or post test. Transthoracic echocardiography. Image quality was adequate.  Study completion:  There were no complications. Transthoracic echocardiography.  M-mode, complete 2D, spectral Doppler, and color Doppler.  Birthdate:  Patient birthdate: [birth year].  Age:  Patient is 72 yr old.  Sex:  Gender: female. BMI: 38.5 kg/m^2.  Blood pressure:     137/68  Patient status: Inpatient.  Study date:  Study date: 08/05/2017. Study time: 08:33 AM.  Location:  [facility].  -------------------------------------------------------------------  ------------------------------------------------------------------- Left ventricle:  The cavity size was at the upper limits of normal. Wall thickness was increased in a pattern of mild LVH. Systolic function was mildly to moderately reduced. The estimated ejection fraction was in the range of 40% to 45%.  Regional wall motion abnormalities:   There is akinesis of the basalinferolateral, inferior, and inferoseptal  myocardium. Features  are consistent with a pseudonormal left ventricular filling pattern, with concomitant abnormal relaxation and increased filling pressure (grade 2 diastolic dysfunction).  ------------------------------------------------------------------- Aortic valve:   Mildly to moderately calcified annulus. Trileaflet; mildly calcified leaflets.  Doppler:  There was no significant regurgitation.  ------------------------------------------------------------------- Aorta:  Aortic root: The aortic root was normal in size.  ------------------------------------------------------------------- Mitral valve:   Mildly to moderately calcified annulus. Moderately thickened, moderately calcified leaflets .  Doppler:  There was moderate regurgitation.    Valve area by pressure half-time: 4.78 cm^2. Indexed valve area by pressure half-time: 2.08 cm^2/m^2. Peak gradient (D): 7 mm Hg.  ------------------------------------------------------------------- Left atrium:  The atrium was mildly dilated.  ------------------------------------------------------------------- Right ventricle:  The cavity size was mildly dilated. Systolic function was moderately reduced.  ------------------------------------------------------------------- Pulmonic valve:    The valve appears to be grossly normal. Doppler:  There was trivial regurgitation.  ------------------------------------------------------------------- Tricuspid valve:   The valve appears to be grossly normal. Doppler:  There was mild-moderate regurgitation.  ------------------------------------------------------------------- Pulmonary artery:   Systolic pressure was severely increased.  ------------------------------------------------------------------- Right atrium:  The atrium was mildly dilated.  ------------------------------------------------------------------- Pericardium:  There was no pericardial  effusion.  ------------------------------------------------------------------- Measurements   Left ventricle                           Value          Reference  LV ID, ED, PLAX chordal          (H)     54    mm       43 - 52  LV ID, ES, PLAX chordal          (H)     42    mm       23 - 38  LV fx shortening, PLAX chordal   (L)     22    %        >=29  LV PW thickness, ED                      12    mm       ----------  IVS/LV PW ratio, ED                      1              <=1.3  Stroke volume, 2D                        118   ml       ----------  Stroke volume/bsa, 2D                    51    ml/m^2   ----------    Ventricular septum                       Value          Reference  IVS thickness, ED                        12    mm       ----------    LVOT  Value          Reference  LVOT ID, S                               23    mm       ----------  LVOT area                                4.15  cm^2     ----------  LVOT peak velocity, S                    129   cm/s     ----------  LVOT mean velocity, S                    85.4  cm/s     ----------  LVOT VTI, S                              28.4  cm       ----------  LVOT peak gradient, S                    7     mm Hg    ----------    Aorta                                    Value          Reference  Aortic root ID, ED                       29    mm       ----------    Left atrium                              Value          Reference  LA ID, A-P, ES                           50    mm       ----------  LA ID/bsa, A-P                           2.18  cm/m^2   <=2.2  LA volume, S                             81    ml       ----------  LA volume/bsa, S                         35.3  ml/m^2   ----------  LA volume, ES, 1-p A4C                   70.4  ml       ----------  LA volume/bsa, ES, 1-p A4C               30.7  ml/m^2   ----------  LA volume, ES, 1-p A2C  94    ml       ----------   LA volume/bsa, ES, 1-p A2C               41    ml/m^2   ----------    Mitral valve                             Value          Reference  Mitral E-wave peak velocity              132   cm/s     ----------  Mitral A-wave peak velocity              107   cm/s     ----------  Mitral deceleration time                 158   ms       150 - 230  Mitral pressure half-time                46    ms       ----------  Mitral peak gradient, D                  7     mm Hg    ----------  Mitral E/A ratio, peak                   1.2            ----------  Mitral valve area, PHT, DP               4.78  cm^2     ----------  Mitral valve area/bsa, PHT, DP           2.08  cm^2/m^2 ----------  Mitral maximal regurg velocity,          553   cm/s     ----------  PISA  Mitral regurg VTI, PISA                  192   cm       ----------    Pulmonary arteries                       Value          Reference  PA pressure, S, DP               (H)     73    mm Hg    <=30    Tricuspid valve                          Value          Reference  Tricuspid regurg peak velocity           382   cm/s     ----------  Tricuspid peak RV-RA gradient            58    mm Hg    ----------    Right atrium                             Value          Reference  RA ID, S-I, ES, A4C              (  H)     63.7  mm       34 - 49  RA area, ES, A4C                 (H)     24.5  cm^2     8.3 - 19.5  RA volume, ES, A/L                       77.9  ml       ----------  RA volume/bsa, ES, A/L                   34    ml/m^2   ----------    Systemic veins                           Value          Reference  Estimated CVP                            15    mm Hg    ----------    Right ventricle                          Value          Reference  TAPSE                                    12.7  mm       ----------  RV pressure, S, DP               (H)     73    mm Hg    <=30  RV s&', lateral, S                        7.51  cm/s      ----------  Legend: (L)  and  (H)  mark values outside specified reference range.  ------------------------------------------------------------------- Prepared and Electronically Authenticated by  Rozann Lesches, M.D. 2019-03-23T09:51:38  MERGE Images   Show images for ECHOCARDIOGRAM COMPLETE  Patient Information   Patient Name Alexis [name], Alexis [name] Sex Female DOB [birth year] SSN [id number]  Reason for Exam  Priority: Anticipated Discharge  Not on file  Surgical History   Surgical History   Procedure Laterality Date Comment Source  CORONARY ANGIOPLASTY  12/19/2003 inferior wall hypokinesis. ef 50% Provider    Other Surgical History   Procedure Laterality Date Comment Source  ABDOMINAL AORTAGRAM N/A 08/11/2014 Procedure: ABDOMINAL Maxcine Ham; Surgeon: Angelia Mould, MD; Location: [facility] CATH LAB; Service: Cardiovascular; Laterality: N/A; Provider  ABDOMINAL HYSTERECTOMY    Provider  APPENDECTOMY    Provider  Arm surgery   Left arm trauma Provider  AV FISTULA PLACEMENT Left 08/21/2014 Procedure: LEFT ARM ARTERIOVENOUS (AV) FISTULA CREATION ; Surgeon: Angelia Mould, MD; Location: [place]; Service: Vascular; Laterality: Left; Provider  BACK SURGERY    Provider  CATARACT EXTRACTION W/PHACO Left 12/22/2014 Procedure: CATARACT EXTRACTION PHACO AND INTRAOCULAR LENS PLACEMENT ([place]); Surgeon: Tonny Branch, MD; Location: [facility]; Service: Ophthalmology; Laterality: Left; CDE 13.48 Provider  dialysis catheter    Provider  MASTECTOMY   Left Provider  PERIPHERAL VASCULAR CATHETERIZATION N/A 09/16/2015 Procedure: Fistulagram; Surgeon: Rosetta Posner, MD; Location: [place]  CV LAB; Service: Cardiovascular; Laterality: N/A; Provider  SPINE SURGERY    Provider  TONSILLECTOMY    Provider  TRANSTHORACIC ECHOCARDIOGRAM  10/01/2010 Left ventricle: The cavity size was mildly dilated. Wall thickness was increased in a pattern of mild LVH. Systolic function was  mildly reduced. The  estimated ejection fraction was in the range of 45% to 50%.  Provider  TUBAL LIGATION    Provider    Patient Data   Height 66 in    BP 137/68 mmHg       Performing Technologist/Nurse   Performing Technologist/Nurse: Androw, Chelsea L                    Implants    No active implants to display in this view.  Order-Level Documents - 08/04/2017:   Scan on 08/05/2017 11:36 AM by Default, Provider, MD      Encounter-Level Documents - 08/04/2017:   Scan on 08/05/2017 1:07 PM by Default, Provider, MD  Scan on 08/05/2017 9:07 AM by Default, Provider, MD  Scan on 08/04/2017 11:50 PM by Default, Provider, MD  Electronic signature on 08/04/2017 7:34 PM - Signed  Document on 08/04/2017 7:25 PM by Hayden Rasmussen, MD: ED PB Billing Extract  Electronic signature on 08/04/2017 3:10 PM - Signed  Electronic signature on 08/04/2017 3:10 PM - Signed      Signed   Electronically signed by Satira Sark, MD on 08/05/17 at [phone] EDT  Printable Result Report   Result Report   External Result Report   External Result Report        Recent Results (from the past 240 hour(s))  MRSA PCR Screening     Status: None   Collection Time: 08/05/17  6:31 AM  Result Value Ref Range Status   MRSA by PCR NEGATIVE NEGATIVE Final    Comment:        The GeneXpert MRSA Assay (FDA approved for NASAL specimens only), is one component of a comprehensive MRSA colonization surveillance program. It is not intended to diagnose MRSA infection nor to guide or monitor treatment for MRSA infections. Performed at [facility], [place] [address] [place], [place] [address]        Today   Subjective    Alexis [name] today has no chest pain.  Denies any shortness of breath.  No fever or chills.  No dizziness.  No diaphoresis.  She wants to go home today         Patient has been seen and examined prior to discharge   Objective   Blood pressure 137/68, pulse 83, temperature 98.5 F  (36.9 C), temperature source Oral, resp. rate 16, height 5\' 6"  (1.676 m), weight 108 kg (238 lb 3.2 oz), SpO2 99 %.   Intake/Output Summary (Last 24 hours) at 08/05/2017 1315 Last data filed at 08/04/2017 2100 Gross per 24 hour  Intake 240 ml  Output -  Net 240 ml    Exam Gen:- Awake   HEENT:- [place].AT,   Neck-Supple Neck,No JVD,  Lungs- mostly clear  CV- S1, S2 normal Abd-  +ve B.Sounds, Abd Soft, No tenderness,    Extremity/Skin:- Intact   Data Review   CBC w Diff:  Lab Results  Component Value Date   WBC 7.9 08/04/2017   HGB 11.8 (L) 08/04/2017   HCT 36.4 08/04/2017   PLT 190 08/04/2017   LYMPHOPCT 20 08/01/2012   MONOPCT 8 08/01/2012   EOSPCT 6 (H) 08/01/2012   BASOPCT 1  08/01/2012    CMP:  Lab Results  Component Value Date   NA 137 08/04/2017   K 4.4 08/04/2017   CL 98 (L) 08/04/2017   CO2 26 08/04/2017   BUN 35 (H) 08/04/2017   CREATININE 5.37 (H) 08/04/2017   PROT 6.6 04/20/2012   ALBUMIN 2.9 (L) 04/20/2012   BILITOT 0.3 04/20/2012   ALKPHOS 57 04/20/2012   AST 20 04/20/2012   ALT 21 04/20/2012  .   Total Discharge time is about 33 minutes  Benito Mccreedy M.D on 08/05/2017 at 1:15 PM  [facility]   Office  [phone]  Dragon dictation system was used to create this note, attempts have been made to correct errors, however presence of uncorrected errors is not a reflection quality of care provided

## 2017-08-05 NOTE — Progress Notes (Signed)
CM talked to patient about the benefits from [facility] services; patient has a nurses aide that assist with her care 4 hrs daily; she is refusing all [facility] services at this time. CM informed patient that if she changed her mind and wanted [facility] services after discharge, her PCP can make arrangement from the office. Mindi Slicker [facility] [phone]

## 2017-08-05 NOTE — Progress Notes (Signed)
  Echocardiogram 2D Echocardiogram has been performed.  Nelvin Tomb L Androw 08/05/2017, 8:58 AM

## 2017-08-05 NOTE — Progress Notes (Signed)
Progress Note  Patient Name: Alexis [name] Date of Encounter: 08/05/2017  Primary Cardiologist: [facility] (Dr. Barbaraann Boys)  Subjective   No chest pain at present.  Headache has improved.  No shortness of breath or palpitations at rest.  Inpatient Medications    Scheduled Meds: . carvedilol  25 mg Oral BID WC  . ferric citrate  420 mg Oral TID WC  . heparin  5,000 Units Subcutaneous Q8H  . hydrALAZINE  100 mg Oral TID  . insulin aspart  0-15 Units Subcutaneous TID WC  . insulin aspart protamine- aspart  20 Units Subcutaneous QAC breakfast  . insulin aspart protamine- aspart  25 Units Subcutaneous Q supper  . isosorbide mononitrate  30 mg Oral Daily  . sevelamer carbonate  800 mg Oral TID WC    PRN Meds: acetaminophen, docusate sodium, gi cocktail, hydrOXYzine, nitroGLYCERIN, ondansetron (ZOFRAN) IV, oxyCODONE-acetaminophen, tiZANidine   Vital Signs    Vitals:   08/04/17 1815 08/04/17 1830 08/04/17 2008 08/05/17 0545  BP:  (!) 155/60 (!) 178/71 137/68  Pulse: 87  89 83  Resp: 17 14  16   Temp:   98.1 F (36.7 C) 98.5 F (36.9 C)  TempSrc:   Oral Oral  SpO2: 98%  100% 99%  Weight:   236 lb 12.8 oz (107.4 kg) 238 lb 3.2 oz (108 kg)  Height:        Intake/Output Summary (Last 24 hours) at 08/05/2017 0956 Last data filed at 08/04/2017 2100 Gross per 24 hour  Intake 240 ml  Output -  Net 240 ml   Filed Weights   08/04/17 1159 08/04/17 2008 08/05/17 0545  Weight: 241 lb (109.3 kg) 236 lb 12.8 oz (107.4 kg) 238 lb 3.2 oz (108 kg)    Telemetry    Sinus rhythm, burst of SVT noted.  Personally reviewed.  ECG    Tracing from 08/05/2017 shows sinus rhythm with PVC and left bundle branch block.  Personally reviewed.  Physical Exam   GEN: No acute distress.   Neck: No JVD. Cardiac: RRR, 2/6 systolic murmur, no gallop.  Respiratory: Nonlabored.  No wheezing. GI: Soft, nontender, bowel sounds present. MS: No pitting edema. Neuro:  Nonfocal. Psych: Alert and  oriented x 3. Normal affect.  Labs    Chemistry Recent Labs  Lab 08/04/17 1228  NA 137  K 4.4  CL 98*  CO2 26  GLUCOSE 178*  BUN 35*  CREATININE 5.37*  CALCIUM 10.0  GFRNONAA 7*  GFRAA 8*  ANIONGAP 13     Hematology Recent Labs  Lab 08/04/17 1228  WBC 7.9  RBC 4.45  HGB 11.8*  HCT 36.4  MCV 81.8  MCH 26.5  MCHC 32.4  RDW 14.3  PLT 190    Cardiac EnzymesNo results for input(s): TROPONINI in the last 168 hours.  Recent Labs  Lab 08/04/17 1235 08/04/17 1656  TROPIPOC 0.06 0.02     BNP Recent Labs  Lab 08/04/17 1227  BNP 1,506.3*     DDimer  Recent Labs  Lab 08/04/17 1638  DDIMER 2.84*     Radiology    Dg Chest 2 View  Result Date: 08/04/2017 CLINICAL DATA:  Chest pain and shortness of breath. Left arm numbness. EXAM: CHEST - 2 VIEW COMPARISON:  07/13/2016 FINDINGS: Postsurgical changes of the left chest wall/axilla. The cardiac silhouette is enlarged. Mediastinal contours appear intact. Calcific atherosclerotic disease of the aorta. There is no evidence of focal airspace consolidation, pleural effusion or pneumothorax. Pulmonary vascular congestion. Osseous  structures are without acute abnormality. Soft tissues are grossly normal. IMPRESSION: Moderately enlarged cardiac silhouette. Pulmonary vascular congestion. Calcific atherosclerotic disease of the aorta. Electronically Signed   By: Fidela Salisbury M.D.   On: 08/04/2017 12:49   Ct Head Wo Contrast  Result Date: 08/04/2017 CLINICAL DATA:  Right-sided head pain beginning yesterday. EXAM: CT HEAD WITHOUT CONTRAST TECHNIQUE: Contiguous axial images were obtained from the base of the skull through the vertex without intravenous contrast. COMPARISON:  CT head without contrast 09/28/2016 FINDINGS: Brain: Mild atrophy and white matter changes are stable. No acute infarct, hemorrhage, or mass lesion is present. The ventricles are proportionate to the degree of atrophy. No significant extra-axial fluid  collection is present. Vascular: Dense atherosclerotic calcifications are present within the cavernous internal carotid arteries bilaterally. There is no hyperdense vessel. Calcifications are present at the dural margin of both vertebral arteries. Skull: Calvarium is intact. No focal lytic or blastic lesions are present. No significant extracranial soft tissue injury is evident. Sinuses/Orbits: The paranasal sinuses and mastoid air cells are clear. A left lens replacement is again noted. Globes and orbits are otherwise normal. IMPRESSION: 1. No acute intracranial abnormality. 2. Stable atrophy and white matter disease. This likely reflects the sequela of chronic microvascular ischemia. Electronically Signed   By: San Morelle M.D.   On: 08/04/2017 19:39    Cardiac Studies   Echocardiogram 08/05/2017: Study Conclusions  - Left ventricle: The cavity size was at the upper limits of   normal. Wall thickness was increased in a pattern of mild LVH.   Systolic function was mildly to moderately reduced. The estimated   ejection fraction was in the range of 40% to 45%. There is   akinesis of the basalinferolateral, inferior, and inferoseptal   myocardium. Features are consistent with a pseudonormal left   ventricular filling pattern, with concomitant abnormal relaxation   and increased filling pressure (grade 2 diastolic dysfunction). - Aortic valve: Mildly to moderately calcified annulus. Trileaflet;   mildly calcified leaflets. - Mitral valve: Mildly to moderately calcified annulus. Moderately   thickened, moderately calcified leaflets . There was moderate   regurgitation. - Left atrium: The atrium was mildly dilated. - Right ventricle: The cavity size was mildly dilated. Systolic   function was moderately reduced. - Right atrium: The atrium was mildly dilated. Central venous   pressure (est): 15 mm Hg. - Tricuspid valve: There was mild-moderate regurgitation. - Pulmonary arteries:  Systolic pressure was severely increased. PA   peak pressure: 73 mm Hg (S). - Pericardium, extracardiac: There was no pericardial effusion.  Patient Profile     72 y.o. 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  She presents with atypical chest pain.  Assessment & Plan    1.  Atypical chest pain, presently resolved.  Cardiac enzymes argue against ACS.  ECG shows old left bundle branch block.  2.  Brief episode of SVT noted by telemetry, not entirely clear that this is symptom provoking.  3.  Cardiomyopathy, possibly ischemic with prior history of inferior infarct.  LVEF stable at 40-45% based on comparison with previous records.  She does have moderate diastolic dysfunction and also right ventricular dysfunction with severe pulmonary hypertension, PASP 73 mmHg.  4.  ESRD on hemodialysis.  5.  Headache.  6.  Elevated d-dimer of 2.84, likely nonspecific in light of comorbidities.  Do not anticipate further ischemic  testing at this time.  She does need to continue with cardiology follow-up as an outpatient, would keep visits with provider at [facility].  Fluid status is managed by hemodialysis.  Do not have high suspicion for pulmonary embolus, elevated d-dimer is likely nonspecific in the setting of current comorbidities.  At least based on comparison with old reports in [place], RV dysfunction does look to be worse as well as degree of pulmonary hypertension.  Would not be unreasonable to obtain a chest CTA in this case - will defer to primary team.  Continue Coreg, hydralazine, Imdur, add low-dose aspirin.  Signed, Rozann Lesches, MD  08/05/2017, 9:56 AM

## 2017-08-06 DIAGNOSIS — R0789 Other chest pain: Secondary | ICD-10-CM | POA: Diagnosis not present

## 2017-08-06 LAB — RENAL FUNCTION PANEL
Albumin: 3 g/dL — ABNORMAL LOW (ref 3.5–5.0)
Anion gap: 12 (ref 5–15)
BUN: 57 mg/dL — AB (ref 6–20)
CHLORIDE: 99 mmol/L — AB (ref 101–111)
CO2: 25 mmol/L (ref 22–32)
CREATININE: 8.29 mg/dL — AB (ref 0.44–1.00)
Calcium: 9.6 mg/dL (ref 8.9–10.3)
GFR calc Af Amer: 5 mL/min — ABNORMAL LOW (ref >60)
GFR calc non Af Amer: 4 mL/min — ABNORMAL LOW (ref >60)
Glucose, Bld: 106 mg/dL — ABNORMAL HIGH (ref 65–99)
Phosphorus: 6.7 mg/dL — ABNORMAL HIGH (ref 2.5–4.6)
Potassium: 4.3 mmol/L (ref 3.5–5.1)
Sodium: 136 mmol/L (ref 135–145)

## 2017-08-06 LAB — CBC
HCT: 30.9 % — ABNORMAL LOW (ref 36.0–46.0)
Hemoglobin: 10.1 g/dL — ABNORMAL LOW (ref 12.0–15.0)
MCH: 26 pg (ref 26.0–34.0)
MCHC: 32.7 g/dL (ref 30.0–36.0)
MCV: 79.4 fL (ref 78.0–100.0)
PLATELETS: 186 10*3/uL (ref 150–400)
RBC: 3.89 MIL/uL (ref 3.87–5.11)
RDW: 14.1 % (ref 11.5–15.5)
WBC: 7.2 10*3/uL (ref 4.0–10.5)

## 2017-08-06 LAB — GLUCOSE, CAPILLARY: Glucose-Capillary: 99 mg/dL (ref 65–99)

## 2017-08-06 MED ORDER — OXYCODONE-ACETAMINOPHEN 5-325 MG PO TABS
ORAL_TABLET | ORAL | Status: AC
  Administered 2017-08-06: 1 via ORAL
  Filled 2017-08-06: qty 1

## 2017-08-06 NOTE — Progress Notes (Signed)
HD tx initiated via 15G x2 w/ venous access function issues, VSS, will cont to monitor while on HD tx

## 2017-08-06 NOTE — Progress Notes (Addendum)
Received call from nurse that patient is ready to leave and has completed hemodialysis.  Chart reviewed.  The discharge order and planning was already done.  Patient left  and was not able to see today morning.

## 2017-08-06 NOTE — Progress Notes (Addendum)
[facility] Progress Note   Subjective:  Seen in room, feels ok. Ended up not finishing HD until early this AM. RN had issues with AVF, will pass on to her home HD unit. For discharge today.  Objective Vitals:   08/06/17 0530 08/06/17 0600 08/06/17 0620 08/06/17 0647  BP: 132/62 139/69 135/69 (!) 154/67  Pulse: 82 81 80 87  Resp: 17 18 17 16   Temp:    98.1 F (36.7 C)  TempSrc:    Oral  SpO2: 97% 99% 98% 98%  Weight:    107.3 kg (236 lb 8.9 oz)  Height:       Physical Exam General: Well appearing, NAD Heart:RRR; no murmur Lungs: CTAB Extremities: No LE edema Dialysis Access: L AVF + bruit  Additional Objective Labs: Basic Metabolic Panel: Recent Labs  Lab 08/04/17 1228 08/06/17 0330  NA 137 136  K 4.4 4.3  CL 98* 99*  CO2 26 25  GLUCOSE 178* 106*  BUN 35* 57*  CREATININE 5.37* 8.29*  CALCIUM 10.0 9.6  PHOS  --  6.7*   Liver Function Tests: Recent Labs  Lab 08/06/17 0330  ALBUMIN 3.0*   CBC: Recent Labs  Lab 08/04/17 1228 08/06/17 0330  WBC 7.9 7.2  HGB 11.8* 10.1*  HCT 36.4 30.9*  MCV 81.8 79.4  PLT 190 186   CBG: Recent Labs  Lab 08/05/17 0808 08/05/17 1141 08/05/17 1636 08/05/17 2137 08/06/17 0749  GLUCAP 143* 124* 149* 102* 99   Studies/Results: Dg Chest 2 View  Result Date: 08/04/2017 CLINICAL DATA:  Chest pain and shortness of breath. Left arm numbness. EXAM: CHEST - 2 VIEW COMPARISON:  07/13/2016 FINDINGS: Postsurgical changes of the left chest wall/axilla. The cardiac silhouette is enlarged. Mediastinal contours appear intact. Calcific atherosclerotic disease of the aorta. There is no evidence of focal airspace consolidation, pleural effusion or pneumothorax. Pulmonary vascular congestion. Osseous structures are without acute abnormality. Soft tissues are grossly normal. IMPRESSION: Moderately enlarged cardiac silhouette. Pulmonary vascular congestion. Calcific atherosclerotic disease of the aorta. Electronically Signed    By: Fidela Salisbury M.D.   On: 08/04/2017 12:49   Ct Head Wo Contrast  Result Date: 08/04/2017 CLINICAL DATA:  Right-sided head pain beginning yesterday. EXAM: CT HEAD WITHOUT CONTRAST TECHNIQUE: Contiguous axial images were obtained from the base of the skull through the vertex without intravenous contrast. COMPARISON:  CT head without contrast 09/28/2016 FINDINGS: Brain: Mild atrophy and white matter changes are stable. No acute infarct, hemorrhage, or mass lesion is present. The ventricles are proportionate to the degree of atrophy. No significant extra-axial fluid collection is present. Vascular: Dense atherosclerotic calcifications are present within the cavernous internal carotid arteries bilaterally. There is no hyperdense vessel. Calcifications are present at the dural margin of both vertebral arteries. Skull: Calvarium is intact. No focal lytic or blastic lesions are present. No significant extracranial soft tissue injury is evident. Sinuses/Orbits: The paranasal sinuses and mastoid air cells are clear. A left lens replacement is again noted. Globes and orbits are otherwise normal. IMPRESSION: 1. No acute intracranial abnormality. 2. Stable atrophy and white matter disease. This likely reflects the sequela of chronic microvascular ischemia. Electronically Signed   By: San Morelle M.D.   On: 08/04/2017 19:39   Medications: . sodium chloride    . sodium chloride     . aspirin EC  81 mg Oral Daily  . carvedilol  25 mg Oral BID WC  . ferric citrate  420 mg Oral TID WC  . heparin  5,000 Units Subcutaneous Q8H  . hydrALAZINE  100 mg Oral TID  . insulin aspart  0-15 Units Subcutaneous TID WC  . insulin aspart protamine- aspart  20 Units Subcutaneous QAC breakfast  . insulin aspart protamine- aspart  25 Units Subcutaneous Q supper  . isosorbide mononitrate  30 mg Oral Daily  . sevelamer carbonate  800 mg Oral TID WC    Dialysis Orders: TTS at [facility]   Assessment/Plan: 1.  CP/ Hx CAD: Resolved now. Negative enzymes. Cardiology has evaluated, apparently had short run of VT. 2.  ESRD:  Usual TTS schedule, s/p HD overnight. 3.  Hypertension/volume: BP variable. No edema on exam, CXR with questionable vasc congestion. 4.  Anemia: Hgb 11.8, follow. 5.  Metabolic bone disease: Ca ok. Follow. 6.  Type 2 DM: per primary.    Veneta Penton, PA-C 08/06/2017, 8:58 AM  [facility] Pager: [phone]  Pt seen, examined and agree w A/P as above.  Kelly Splinter MD [employer] pager [phone]   08/06/2017, 5:12 PM

## 2017-08-06 NOTE — Progress Notes (Signed)
Back to room from dialysis. CBG = 99. VSS. Dr. Carolin Sicks notified. Okay to d/c to home.

## 2017-08-06 NOTE — Progress Notes (Signed)
HD tx completed @ 0620 w/ venous access function issues throughout tx, UF goal met, blood rinsed back, VSS, report called to Estanislado Pandy, RN

## 2017-08-13 ENCOUNTER — Other Ambulatory Visit: Payer: Self-pay

## 2017-08-13 ENCOUNTER — Emergency Department (HOSPITAL_COMMUNITY): Payer: Medicare Other

## 2017-08-13 ENCOUNTER — Observation Stay (HOSPITAL_COMMUNITY)
Admission: EM | Admit: 2017-08-13 | Discharge: 2017-08-14 | Disposition: A | Discharge status: Home / Self Care | Payer: Medicare Other | Attending: Internal Medicine | Admitting: Internal Medicine

## 2017-08-13 ENCOUNTER — Encounter (HOSPITAL_COMMUNITY): Payer: Self-pay | Admitting: *Deleted

## 2017-08-13 DIAGNOSIS — Z87891 Personal history of nicotine dependence: Secondary | ICD-10-CM | POA: Insufficient documentation

## 2017-08-13 DIAGNOSIS — Z992 Dependence on renal dialysis: Secondary | ICD-10-CM | POA: Diagnosis not present

## 2017-08-13 DIAGNOSIS — J439 Emphysema, unspecified: Secondary | ICD-10-CM | POA: Diagnosis not present

## 2017-08-13 DIAGNOSIS — M899 Disorder of bone, unspecified: Secondary | ICD-10-CM | POA: Insufficient documentation

## 2017-08-13 DIAGNOSIS — E118 Type 2 diabetes mellitus with unspecified complications: Secondary | ICD-10-CM | POA: Diagnosis present

## 2017-08-13 DIAGNOSIS — Z6838 Body mass index (BMI) 38.0-38.9, adult: Secondary | ICD-10-CM | POA: Insufficient documentation

## 2017-08-13 DIAGNOSIS — Z79899 Other long term (current) drug therapy: Secondary | ICD-10-CM | POA: Insufficient documentation

## 2017-08-13 DIAGNOSIS — Z794 Long term (current) use of insulin: Secondary | ICD-10-CM | POA: Diagnosis not present

## 2017-08-13 DIAGNOSIS — Z7982 Long term (current) use of aspirin: Secondary | ICD-10-CM | POA: Diagnosis not present

## 2017-08-13 DIAGNOSIS — N2581 Secondary hyperparathyroidism of renal origin: Secondary | ICD-10-CM | POA: Insufficient documentation

## 2017-08-13 DIAGNOSIS — R55 Syncope and collapse: Secondary | ICD-10-CM | POA: Diagnosis not present

## 2017-08-13 DIAGNOSIS — I429 Cardiomyopathy, unspecified: Secondary | ICD-10-CM | POA: Insufficient documentation

## 2017-08-13 DIAGNOSIS — E669 Obesity, unspecified: Secondary | ICD-10-CM | POA: Diagnosis not present

## 2017-08-13 DIAGNOSIS — Z23 Encounter for immunization: Secondary | ICD-10-CM | POA: Diagnosis not present

## 2017-08-13 DIAGNOSIS — E1122 Type 2 diabetes mellitus with diabetic chronic kidney disease: Secondary | ICD-10-CM | POA: Insufficient documentation

## 2017-08-13 DIAGNOSIS — Z9012 Acquired absence of left breast and nipple: Secondary | ICD-10-CM | POA: Insufficient documentation

## 2017-08-13 DIAGNOSIS — R0600 Dyspnea, unspecified: Secondary | ICD-10-CM | POA: Diagnosis present

## 2017-08-13 DIAGNOSIS — N186 End stage renal disease: Secondary | ICD-10-CM | POA: Diagnosis not present

## 2017-08-13 DIAGNOSIS — I5042 Chronic combined systolic (congestive) and diastolic (congestive) heart failure: Secondary | ICD-10-CM | POA: Diagnosis not present

## 2017-08-13 DIAGNOSIS — I132 Hypertensive heart and chronic kidney disease with heart failure and with stage 5 chronic kidney disease, or end stage renal disease: Secondary | ICD-10-CM | POA: Insufficient documentation

## 2017-08-13 DIAGNOSIS — R7989 Other specified abnormal findings of blood chemistry: Secondary | ICD-10-CM

## 2017-08-13 DIAGNOSIS — I2510 Atherosclerotic heart disease of native coronary artery without angina pectoris: Secondary | ICD-10-CM | POA: Insufficient documentation

## 2017-08-13 DIAGNOSIS — D649 Anemia, unspecified: Secondary | ICD-10-CM | POA: Insufficient documentation

## 2017-08-13 DIAGNOSIS — R748 Abnormal levels of other serum enzymes: Secondary | ICD-10-CM | POA: Insufficient documentation

## 2017-08-13 DIAGNOSIS — I272 Pulmonary hypertension, unspecified: Secondary | ICD-10-CM | POA: Insufficient documentation

## 2017-08-13 DIAGNOSIS — Z955 Presence of coronary angioplasty implant and graft: Secondary | ICD-10-CM | POA: Insufficient documentation

## 2017-08-13 DIAGNOSIS — Z853 Personal history of malignant neoplasm of breast: Secondary | ICD-10-CM | POA: Insufficient documentation

## 2017-08-13 DIAGNOSIS — R0602 Shortness of breath: Principal | ICD-10-CM | POA: Insufficient documentation

## 2017-08-13 DIAGNOSIS — R778 Other specified abnormalities of plasma proteins: Secondary | ICD-10-CM

## 2017-08-13 DIAGNOSIS — R0609 Other forms of dyspnea: Secondary | ICD-10-CM | POA: Diagnosis not present

## 2017-08-13 HISTORY — DX: Headache, unspecified: R51.9

## 2017-08-13 HISTORY — DX: Dependence on renal dialysis: Z99.2

## 2017-08-13 HISTORY — DX: Other chronic pain: G89.29

## 2017-08-13 HISTORY — DX: Dependence on renal dialysis: N18.6

## 2017-08-13 HISTORY — DX: Left bundle-branch block, unspecified: I44.7

## 2017-08-13 HISTORY — DX: Dorsalgia, unspecified: M54.9

## 2017-08-13 HISTORY — DX: Headache: R51

## 2017-08-13 LAB — BASIC METABOLIC PANEL
Anion gap: 14 (ref 5–15)
BUN: 26 mg/dL — ABNORMAL HIGH (ref 6–20)
CALCIUM: 9.7 mg/dL (ref 8.9–10.3)
CO2: 24 mmol/L (ref 22–32)
CREATININE: 5.14 mg/dL — AB (ref 0.44–1.00)
Chloride: 95 mmol/L — ABNORMAL LOW (ref 101–111)
GFR calc Af Amer: 9 mL/min — ABNORMAL LOW (ref >60)
GFR calc non Af Amer: 8 mL/min — ABNORMAL LOW (ref >60)
GLUCOSE: 206 mg/dL — AB (ref 65–99)
Potassium: 3.9 mmol/L (ref 3.5–5.1)
Sodium: 133 mmol/L — ABNORMAL LOW (ref 135–145)

## 2017-08-13 LAB — CBC WITH DIFFERENTIAL/PLATELET
BASOS ABS: 0 10*3/uL (ref 0.0–0.1)
Basophils Relative: 1 %
EOS PCT: 7 %
Eosinophils Absolute: 0.5 10*3/uL (ref 0.0–0.7)
HCT: 36.1 % (ref 36.0–46.0)
Hemoglobin: 11.7 g/dL — ABNORMAL LOW (ref 12.0–15.0)
LYMPHS ABS: 1.4 10*3/uL (ref 0.7–4.0)
LYMPHS PCT: 19 %
MCH: 25.9 pg — ABNORMAL LOW (ref 26.0–34.0)
MCHC: 32.4 g/dL (ref 30.0–36.0)
MCV: 80 fL (ref 78.0–100.0)
MONO ABS: 0.5 10*3/uL (ref 0.1–1.0)
Monocytes Relative: 7 %
NEUTROS ABS: 4.9 10*3/uL (ref 1.7–7.7)
Neutrophils Relative %: 66 %
PLATELETS: 165 10*3/uL (ref 150–400)
RBC: 4.51 MIL/uL (ref 3.87–5.11)
RDW: 14.2 % (ref 11.5–15.5)
WBC: 7.5 10*3/uL (ref 4.0–10.5)

## 2017-08-13 LAB — BRAIN NATRIURETIC PEPTIDE: B Natriuretic Peptide: 1371 pg/mL — ABNORMAL HIGH (ref 0.0–100.0)

## 2017-08-13 LAB — TROPONIN I
Troponin I: 0.04 ng/mL (ref <0.03)
Troponin I: 0.05 ng/mL (ref <0.03)
Troponin I: 0.06 ng/mL (ref <0.03)

## 2017-08-13 LAB — CBG MONITORING, ED: GLUCOSE-CAPILLARY: 200 mg/dL — AB (ref 65–99)

## 2017-08-13 MED ORDER — SODIUM CHLORIDE 0.9% FLUSH: 3.0000 mL | INTRAVENOUS | Status: DC | PRN

## 2017-08-13 MED ORDER — TIZANIDINE HCL 4 MG PO TABS: 4.0000 mg | ORAL_TABLET | Freq: Every day | ORAL | Status: DC | PRN

## 2017-08-13 MED ORDER — ASPIRIN EC 81 MG PO TBEC
81.0000 mg | DELAYED_RELEASE_TABLET | Freq: Every day | ORAL | Status: DC
  Administered 2017-08-13 – 2017-08-14 (×2): 81 mg via ORAL
  Filled 2017-08-13 (×2): qty 1

## 2017-08-13 MED ORDER — ACETAMINOPHEN 325 MG PO TABS: 650.0000 mg | ORAL_TABLET | Freq: Four times a day (QID) | ORAL | Status: DC | PRN

## 2017-08-13 MED ORDER — SEVELAMER CARBONATE 800 MG PO TABS: 800.0000 mg | ORAL_TABLET | Freq: Three times a day (TID) | ORAL | Status: DC

## 2017-08-13 MED ORDER — ASPIRIN 81 MG PO TBEC: 81.0000 mg | DELAYED_RELEASE_TABLET | Freq: Every day | ORAL | Status: DC

## 2017-08-13 MED ORDER — NITROGLYCERIN 0.4 MG SL SUBL: 0.4000 mg | SUBLINGUAL_TABLET | SUBLINGUAL | Status: DC | PRN

## 2017-08-13 MED ORDER — HYDRALAZINE HCL 25 MG PO TABS
100.0000 mg | ORAL_TABLET | Freq: Three times a day (TID) | ORAL | Status: DC
  Administered 2017-08-13 – 2017-08-14 (×2): 100 mg via ORAL
  Filled 2017-08-13 (×3): qty 4

## 2017-08-13 MED ORDER — HYDRALAZINE HCL 100 MG PO TABS: 100.0000 mg | ORAL_TABLET | Freq: Three times a day (TID) | ORAL | Status: DC

## 2017-08-13 MED ORDER — ACETAMINOPHEN 650 MG RE SUPP: 650.0000 mg | Freq: Four times a day (QID) | RECTAL | Status: DC | PRN

## 2017-08-13 MED ORDER — HEPARIN SODIUM (PORCINE) 5000 UNIT/ML IJ SOLN
5000.0000 [IU] | Freq: Three times a day (TID) | INTRAMUSCULAR | Status: DC
  Administered 2017-08-13: 5000 [IU] via SUBCUTANEOUS
  Filled 2017-08-13 (×2): qty 1

## 2017-08-13 MED ORDER — ISOSORBIDE MONONITRATE ER 60 MG PO TB24
30.0000 mg | ORAL_TABLET | Freq: Every day | ORAL | Status: DC
  Administered 2017-08-13 – 2017-08-14 (×2): 30 mg via ORAL
  Filled 2017-08-13 (×2): qty 1

## 2017-08-13 MED ORDER — HYDROXYZINE HCL 25 MG PO TABS
25.0000 mg | ORAL_TABLET | Freq: Two times a day (BID) | ORAL | Status: DC | PRN
  Administered 2017-08-13: 25 mg via ORAL
  Filled 2017-08-13: qty 1

## 2017-08-13 MED ORDER — PNEUMOCOCCAL VAC POLYVALENT 25 MCG/0.5ML IJ INJ
0.5000 mL | INJECTION | INTRAMUSCULAR | Status: AC
Start: 2017-08-14 — End: 2017-08-14
  Administered 2017-08-14: 0.5 mL via INTRAMUSCULAR
  Filled 2017-08-13: qty 0.5

## 2017-08-13 MED ORDER — SODIUM CHLORIDE 0.9 % IV SOLN: 250.0000 mL | INTRAVENOUS | Status: DC | PRN

## 2017-08-13 MED ORDER — DOCUSATE SODIUM 100 MG PO CAPS: 100.0000 mg | ORAL_CAPSULE | Freq: Every day | ORAL | Status: DC | PRN

## 2017-08-13 MED ORDER — FERRIC CITRATE 1 GM 210 MG(FE) PO TABS
420.0000 mg | ORAL_TABLET | Freq: Three times a day (TID) | ORAL | Status: DC
  Administered 2017-08-14 (×3): 420 mg via ORAL
  Filled 2017-08-13 (×9): qty 2

## 2017-08-13 MED ORDER — SODIUM CHLORIDE 0.9% FLUSH: 3.0000 mL | Freq: Two times a day (BID) | INTRAVENOUS | Status: DC

## 2017-08-13 MED ORDER — SEVELAMER CARBONATE 800 MG PO TABS
800.0000 mg | ORAL_TABLET | Freq: Three times a day (TID) | ORAL | Status: DC
  Administered 2017-08-13 – 2017-08-14 (×3): 800 mg via ORAL
  Filled 2017-08-13 (×3): qty 1

## 2017-08-13 MED ORDER — CARVEDILOL 12.5 MG PO TABS
25.0000 mg | ORAL_TABLET | Freq: Two times a day (BID) | ORAL | Status: DC
  Administered 2017-08-13: 25 mg via ORAL
  Filled 2017-08-13 (×2): qty 2

## 2017-08-13 NOTE — ED Notes (Signed)
CRITICAL VALUE ALERT  Critical Value:  Trop 0.04  Date & Time Notied:  08/13/17, 1224  Provider Notified: Dr. Thurnell Garbe  Orders Received/Actions taken: no new orders at this time

## 2017-08-13 NOTE — Progress Notes (Signed)
Notified Dr. Georges Mouse that the patient does not have an IV access at this time and that multiple attempts were made to procure and IV also with an ultrasound, but we were unable to get IV access. Dr. Georges Mouse said she was okay for now not to have an IV access.

## 2017-08-13 NOTE — ED Notes (Signed)
Patient requesting to sit in chair in room.  Patient assisted to chair side.  Family in room.

## 2017-08-13 NOTE — H&P (Signed)
TRH H&P   Patient Demographics:    Alexis [name], is a 72 y.o. female  MRN: [id number]   DOB - Jul 17, 1945  Admit Date - 08/13/2017  Outpatient Primary MD for the patient is Nolene Ebbs, MD  Referring MD/NP/PA: Francine Graven  Outpatient Specialists:   Patient coming from: home  Chief Complaint  Patient presents with  . Shortness of Breath      HPI:    Alexis [name]  is a 72 y.o. female, w ESRD on HD (T, T, S), hypertension, hyperlipidemia, Dm2, CHF, apparently c/o dyspnea and orthopnea.  Pt is not sure if she has gained weight.  Pt notes lower ext edema.   In ED,  Pt had troponin positive and ED was not comfortable with sending her home.   CT brain IMPRESSION: Senescent changes without acute superimposed finding.  CT chest IMPRESSION: 1. Small pulmonary nodules are unchanged from 2012 compatible with benign abnormality. 2. Aortic Atherosclerosis (ICD10-I70.0) and Emphysema (ICD10-J43.9). 3. Three vessel coronary artery calcifications. 4. Gallstones.  Na 133, K 3.9, Bun 26, Creatinine 5.14 BNP 1,371.0 Trop 0.04 Wbc 7.5, Hgb 11.7, Plt 165   Pt will be admitted for dyspnea likely secondary to fluid overload as well as troponin elevation.         Review of systems:    In addition to the HPI above,  No Fever-chills, No Headache, No changes with Vision or hearing, No problems swallowing food or Liquids, No Chest pain, Cough  No Abdominal pain, No Nausea or Vommitting, Bowel movements are regular, No Blood in stool or Urine, No dysuria, No new skin rashes or bruises, No new joints pains-aches,  No new weakness, tingling, numbness in any extremity, No recent weight gain or loss, No polyuria, polydypsia or polyphagia, No significant Mental Stressors.  A full 10 point Review of Systems was done, except as stated above, all other Review of Systems  were negative.   With Past History of the following :    Past Medical History:  Diagnosis Date  . Arthritis   . Cancer [facility]) 2005    left breast  . CHF (congestive heart failure) ([place])   . Chronic back pain   . Chronic kidney disease 03/2014   dialysis t/th/sa  . Coronary artery disease   . Diabetes mellitus    Type 2  . Diabetic nephropathy ([place]) 01-08-13  . Dyslipidemia 01-08-13  . ESRD on hemodialysis ([place])    Tu, Th, Sat  . Headache   . Hyperlipidemia   . Hypertension   . LBBB (left bundle branch block)   . Proteinuria 01-08-13  . Thyroid disease 01-08-13   Hyper-parathyroidism-secondary      Past Surgical History:  Procedure Laterality Date  . ABDOMINAL AORTAGRAM N/A 08/11/2014   Procedure: ABDOMINAL Maxcine Ham;  Surgeon: Angelia Mould, MD;  Location: [facility] CATH LAB;  Service: Cardiovascular;  Laterality: N/A;  .  ABDOMINAL HYSTERECTOMY    . APPENDECTOMY    . Arm surgery     Left arm trauma  . AV FISTULA PLACEMENT Left 08/21/2014   Procedure: LEFT ARM ARTERIOVENOUS (AV) FISTULA CREATION ;  Surgeon: Angelia Mould, MD;  Location: [facility];  Service: Vascular;  Laterality: Left;  . BACK SURGERY    . CATARACT EXTRACTION W/PHACO Left 12/22/2014   Procedure: CATARACT EXTRACTION PHACO AND INTRAOCULAR LENS PLACEMENT (IOC);  Surgeon: Tonny Branch, MD;  Location: [facility];  Service: Ophthalmology;  Laterality: Left;  CDE 13.48  . CORONARY ANGIOPLASTY  12/19/2003   inferior wall hypokinesis. ef 50%  . dialysis catheter    . MASTECTOMY     Left  . PERIPHERAL VASCULAR CATHETERIZATION N/A 09/16/2015   Procedure: Fistulagram;  Surgeon: Rosetta Posner, MD;  Location: [facility] CV LAB;  Service: Cardiovascular;  Laterality: N/A;  . SPINE SURGERY    . TONSILLECTOMY    . TRANSTHORACIC ECHOCARDIOGRAM  10/01/2010    Left ventricle: The cavity size was mildly dilated. Wall thickness was increased in a pattern of mild LVH. Systolic function was   mildly reduced. The estimated ejection  fraction was in the range  of 45% to 50%.   . TUBAL LIGATION        Social History:     Social History   Tobacco Use  . Smoking status: Former Smoker    Packs/day: 1.00    Years: 50.00    Pack years: 50.00    Types: Cigarettes    Last attempt to quit: 05/16/1998    Years since quitting: 19.2  . Smokeless tobacco: Never Used  Substance Use Topics  . Alcohol use: No    Alcohol/week: 0.0 oz     Lives - at home  Mobility - walks by self   Family History :     Family History  Problem Relation Age of Onset  . Breast cancer Mother        Died age 91  . Cancer Mother 76       Breast  . Heart disease Mother   . Hyperlipidemia Mother   . Hypertension Mother   . Heart attack Mother       Home Medications:   Prior to Admission medications   Medication Sig Start Date End Date Taking? Authorizing Provider  aspirin EC 81 MG EC tablet Take 1 tablet (81 mg total) by mouth daily. 08/05/17   Osei-Bonsu, Iona Beard, MD  AURYXIA 1 GM 210 MG(Fe) tablet Take 420 mg by mouth 3 (three) times daily with meals. 07/25/17   [provider]  carvedilol (COREG) 25 MG tablet Take 25 mg by mouth 2 (two) times daily with a meal.    [provider]  diclofenac sodium (VOLTAREN) 1 % GEL Apply 1 application topically 4 (four) times daily as needed (pain).  06/27/17   [provider]  docusate sodium (COLACE) 100 MG capsule Take 100 mg by mouth daily as needed for mild constipation.  03/25/14   [provider]  hydrALAZINE (APRESOLINE) 100 MG tablet Take 100 mg by mouth 3 (three) times daily.    [provider]  hydrOXYzine (ATARAX/VISTARIL) 25 MG tablet Take 25 mg by mouth 2 (two) times daily as needed for itching.    [provider]  insulin aspart protamine-insulin aspart (NOVOLOG 70/30) (70-30) 100 UNIT/ML injection Inject 20-25 Units into the skin 2 (two) times daily with a meal. 20 UNITS IN THE MORNING AND 25 UNITS AT  BEDTIME    [provider]  isosorbide mononitrate (IMDUR) 30 MG 24 hr tablet Take 30 mg by mouth daily.  08/03/15   [provider]  nitroGLYCERIN (NITROSTAT) 0.4 MG SL tablet Place 0.4 mg under the tongue every 5 (five) minutes as needed for chest pain.    [provider]  sevelamer carbonate (RENVELA) 800 MG tablet Take 800 mg by mouth 3 (three) times daily with meals.  03/25/14   [provider]  tiZANidine (ZANAFLEX) 4 MG tablet Take 4 mg by mouth daily as needed for muscle spasms.  07/31/17   [provider]     Allergies:     Allergies  Allergen Reactions  . Olmesartan Other (See Comments)    Hyperkalemia     Physical Exam:   Vitals  Blood pressure (!) 181/60, pulse 81, temperature 98.7 F (37.1 C), temperature source Oral, resp. rate 20, height 5\' 6"  (1.676 m), weight 107.4 kg (236 lb 12.4 oz), SpO2 99 %.   1. General  lying in bed in NAD,    2. Normal affect and insight, Not Suicidal or Homicidal, Awake Alert, Oriented X 3.  3. No F.N deficits, ALL C.Nerves Intact, Strength 5/5 all 4 extremities, Sensation intact all 4 extremities, Plantars down going.  4. Ears and Eyes appear Normal, Conjunctivae clear, PERRLA. Moist Oral Mucosa.  5. Supple Neck, equivocal JVD, No cervical lymphadenopathy appriciated, No Carotid Bruits.  6. Symmetrical Chest wall movement, Good air movement bilaterally, slight decrease in bs bilateral base, no wheezing  7. RRR, s1, s2, 2/6 sem apex  8. Positive Bowel Sounds, Abdomen Soft, No tenderness, No organomegaly appriciated,No rebound -guarding or rigidity.  9.  No Cyanosis, 1+ edema   No Skin Rash or Bruise.  10. Good muscle tone,  joints appear normal , no effusions, Normal ROM.  11. No Palpable Lymph Nodes in Neck or Axillae     Data Review:    CBC Recent Labs  Lab 08/13/17 1147  WBC 7.5  HGB 11.7*  HCT 36.1  PLT 165  MCV 80.0  MCH 25.9*  MCHC 32.4  RDW 14.2  LYMPHSABS 1.4  MONOABS 0.5  EOSABS  0.5  BASOSABS 0.0   ------------------------------------------------------------------------------------------------------------------  Chemistries  Recent Labs  Lab 08/13/17 1147  NA 133*  K 3.9  CL 95*  CO2 24  GLUCOSE 206*  BUN 26*  CREATININE 5.14*  CALCIUM 9.7   ------------------------------------------------------------------------------------------------------------------ estimated creatinine clearance is 12.4 mL/min (A) (by C-G formula based on SCr of 5.14 mg/dL (H)). ------------------------------------------------------------------------------------------------------------------ No results for input(s): TSH, T4TOTAL, T3FREE, THYROIDAB in the last 72 hours.  Invalid input(s): FREET3  Coagulation profile No results for input(s): INR, PROTIME in the last 168 hours. ------------------------------------------------------------------------------------------------------------------- No results for input(s): DDIMER in the last 72 hours. -------------------------------------------------------------------------------------------------------------------  Cardiac Enzymes Recent Labs  Lab 08/13/17 1147 08/13/17 1359  TROPONINI 0.04* 0.05*   ------------------------------------------------------------------------------------------------------------------    Component Value Date/Time   BNP 1,371.0 (H) 08/13/2017 1147     ---------------------------------------------------------------------------------------------------------------  Urinalysis    Component Value Date/Time   COLORURINE YELLOW 08/19/2012 1126   APPEARANCEUR CLOUDY (A) 08/19/2012 1126   LABSPEC 1.018 08/19/2012 1126   PHURINE 5.0 08/19/2012 1126   GLUCOSEU NEGATIVE 08/19/2012 1126   HGBUR SMALL (A) 08/19/2012 [place] 08/19/2012 [place] 08/19/2012 1126   PROTEINUR >300 (A) 08/19/2012 1126   UROBILINOGEN 0.2 08/19/2012 1126   NITRITE NEGATIVE 08/19/2012 1126    LEUKOCYTESUR SMALL (A) 08/19/2012 1126    ----------------------------------------------------------------------------------------------------------------  Imaging Results:    Ct Head Wo Contrast  Result Date: 08/13/2017 CLINICAL DATA:  Generalized muscle weakness. EXAM: CT HEAD WITHOUT CONTRAST TECHNIQUE: Contiguous axial images were obtained from the base of the skull through the vertex without intravenous contrast. COMPARISON:  08/04/2017 FINDINGS: Brain: No evidence of acute infarction, hemorrhage, hydrocephalus, extra-axial collection or mass lesion/mass effect. Mild cerebral volume loss for age. Probable mild chronic small vessel ischemia in the cerebral white matter. Vascular: Atherosclerotic calcification.  No hyperdense vessel. Skull: Negative Sinuses/Orbits: Left cataract resection. IMPRESSION: Senescent changes without acute superimposed finding. Electronically Signed   By: Monte Fantasia M.D.   On: 08/13/2017 13:59   Ct Chest Wo Contrast  Result Date: 08/13/2017 CLINICAL DATA:  Dyspnea. EXAM: CT CHEST WITHOUT CONTRAST TECHNIQUE: Multidetector CT imaging of the chest was performed following the standard protocol without IV contrast. COMPARISON:  10/01/2010 FINDINGS: Cardiovascular: Mild cardiac enlargement. Aortic atherosclerosis. Calcifications within the RCA, LAD and left circumflex coronary arteries identified. Mediastinum/Nodes: Trachea appears patent and is midline. Normal appearance of the esophagus. No enlarged mediastinal or hilar lymph nodes. Status post left axillary nodal dissection. Lungs/Pleura: No pleural effusion. Mild changes of emphysema. 3 mm nodule in the right upper lobe is identified, image 38/4. Within the right middle lobe there is a 7 mm lung nodule, image 80/4. These are both unchanged from previous exam. Upper Abdomen: Stones identified within the gallbladder. Extensive aortic atherosclerosis and branch vessel disease. Musculoskeletal: Degenerative disc disease  noted within the thoracic spine. There is advanced degenerative changes involving both glenohumeral joints. IMPRESSION: 1. Small pulmonary nodules are unchanged from 2012 compatible with benign abnormality. 2. Aortic Atherosclerosis (ICD10-I70.0) and Emphysema (ICD10-J43.9). 3. Three vessel coronary artery calcifications. 4. Gallstones. Electronically Signed   By: Kerby Moors M.D.   On: 08/13/2017 14:06   nsr at 95, Nl axis, LBBB   Assessment & Plan:    Principal Problem:   Dyspnea Active Problems:   CAD (coronary artery disease)   Type II diabetes mellitus with manifestations (HCC)   ESRD (end stage renal disease) ([place])    Dyspnea likely secondary to fluid overload Nephrology consult for dialysis  Troponin elevation likely secondary to renal insufficiency Tele Trop I q6h x3 Check cardiac echo Consider stress testing Cardiology consult in am requested  DM2 fsbs ac and qhs, ISS\  ESRD on HD (T, T, S) Cont sevelamer  CAD, CHF (40-45%) Cont aspirin 81mg  po qday Cont carvedilol 25mg  po bid Cont imdur 30mg  po qday Cont hydralazine 100mg  po tid   DVT Prophylaxis Heparin - - SCDs   AM Labs Ordered, also please review Full Orders  Family Communication: Admission, patients condition and plan of care including tests being ordered have been discussed with the patient who indicate understanding and agree with the plan and Code Status.  Code Status FULL CODE  Likely DC to  home  Condition GUARDED   Consults called: nephrology , cardiology  Admission status: observation  Time spent in minutes : 45   Jani Gravel M.D on 08/13/2017 at 6:01 PM  Between 7am to 7pm - Pager - [phone]  . After 7pm go to [email or web address] - password [facility]  [facility] - Office  [phone]

## 2017-08-13 NOTE — ED Notes (Signed)
Date and time results received: 08/13/17 1450 (use smartphrase ".now" to insert current time)  Test: troponin Critical Value:0.05  Name of Provider Notified: km  Orders Received? Or Actions Taken?: consult

## 2017-08-13 NOTE — ED Provider Notes (Signed)
[facility] EMERGENCY DEPARTMENT Provider Note   CSN: [id number] Arrival date & time: 08/13/17  1046     History   Chief Complaint Chief Complaint  Patient presents with  . Shortness of Breath    HPI Alexis [name] is a 72 y.o. female.  HPI  Pt was seen at 1120. Per pt, c/o sudden onset and resolution of one episode of generalized weakness that occurred approximately 0700 PTA. Pt states while getting ready for church she developed SOB, lightheadedness, and generalized weakness. Pt states she feels slightly better on arrival to ED. Pt states she was "just discharged from [facility] for my heart." Denies palpitations, no cough, no back pain, no abd pain, no N/V/D, no syncope, no fevers. LD HD yesterday.   Past Medical History:  Diagnosis Date  . Arthritis   . Cancer [facility]) 2005    left breast  . CHF (congestive heart failure) ([place])   . Chronic back pain   . Chronic kidney disease 03/2014   dialysis t/th/sa  . Coronary artery disease   . Diabetes mellitus    Type 2  . Diabetic nephropathy ([place]) 01-08-13  . Dyslipidemia 01-08-13  . ESRD on hemodialysis ([place])    Tu, Th, Sat  . Headache   . Hyperlipidemia   . Hypertension   . LBBB (left bundle branch block)   . Proteinuria 01-08-13  . Thyroid disease 01-08-13   Hyper-parathyroidism-secondary    Patient Active Problem List   Diagnosis Date Noted  . Chest pain 08/04/2017  . 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 Essential hypertension 08/15/2012  . CAD (coronary artery disease) 03/25/2011  . Diabetes mellitus 03/25/2011  . Chest pain 03/25/2011  . CHF (congestive heart failure) ([place]) 10/14/2010  . CKD (chronic kidney disease) 10/14/2010  . Obesity 10/14/2010    Past Surgical History:  Procedure Laterality Date  . ABDOMINAL AORTAGRAM N/A 08/11/2014   Procedure: ABDOMINAL Maxcine Ham;  Surgeon: Angelia Mould, MD;  Location: [facility] CATH LAB;  Service: Cardiovascular;  Laterality:  N/A;  . ABDOMINAL HYSTERECTOMY    . APPENDECTOMY    . Arm surgery     Left arm trauma  . AV FISTULA PLACEMENT Left 08/21/2014   Procedure: LEFT ARM ARTERIOVENOUS (AV) FISTULA CREATION ;  Surgeon: Angelia Mould, MD;  Location: [place];  Service: Vascular;  Laterality: Left;  . BACK SURGERY    . CATARACT EXTRACTION W/PHACO Left 12/22/2014   Procedure: CATARACT EXTRACTION PHACO AND INTRAOCULAR LENS PLACEMENT (IOC);  Surgeon: Tonny Branch, MD;  Location: [facility];  Service: Ophthalmology;  Laterality: Left;  CDE 13.48  . CORONARY ANGIOPLASTY  12/19/2003   inferior wall hypokinesis. ef 50%  . dialysis catheter    . MASTECTOMY     Left  . PERIPHERAL VASCULAR CATHETERIZATION N/A 09/16/2015   Procedure: Fistulagram;  Surgeon: Rosetta Posner, MD;  Location: [place] CV LAB;  Service: Cardiovascular;  Laterality: N/A;  . SPINE SURGERY    . TONSILLECTOMY    . TRANSTHORACIC ECHOCARDIOGRAM  10/01/2010    Left ventricle: The cavity size was mildly dilated. Wall thickness was increased in a pattern of mild LVH. Systolic function was   mildly reduced. The estimated ejection fraction was in the range  of 45% to 50%.   . TUBAL LIGATION       OB History   None      Home Medications    Prior to Admission medications   Medication Sig Start Date  End Date Taking? Authorizing Provider  aspirin EC 81 MG EC tablet Take 1 tablet (81 mg total) by mouth daily. 08/05/17   Osei-Bonsu, Iona Beard, MD  AURYXIA 1 GM 210 MG(Fe) tablet Take 420 mg by mouth 3 (three) times daily with meals. 07/25/17   [provider]  carvedilol (COREG) 25 MG tablet Take 25 mg by mouth 2 (two) times daily with a meal.    [provider]  diclofenac sodium (VOLTAREN) 1 % GEL Apply 1 application topically 4 (four) times daily as needed (pain).  06/27/17   [provider]  docusate sodium (COLACE) 100 MG capsule Take 100 mg by mouth daily as needed for mild constipation.  03/25/14   [provider]  hydrALAZINE  (APRESOLINE) 100 MG tablet Take 100 mg by mouth 3 (three) times daily.    [provider]  hydrOXYzine (ATARAX/VISTARIL) 25 MG tablet Take 25 mg by mouth 2 (two) times daily as needed for itching.    [provider]  insulin aspart protamine-insulin aspart (NOVOLOG 70/30) (70-30) 100 UNIT/ML injection Inject 20-25 Units into the skin 2 (two) times daily with a meal. 20 UNITS IN THE MORNING AND 25 UNITS AT BEDTIME    [provider]  isosorbide mononitrate (IMDUR) 30 MG 24 hr tablet Take 30 mg by mouth daily.  08/03/15   [provider]  nitroGLYCERIN (NITROSTAT) 0.4 MG SL tablet Place 0.4 mg under the tongue every 5 (five) minutes as needed for chest pain.    [provider]  sevelamer carbonate (RENVELA) 800 MG tablet Take 800 mg by mouth 3 (three) times daily with meals.  03/25/14   [provider]  tiZANidine (ZANAFLEX) 4 MG tablet Take 4 mg by mouth daily as needed for muscle spasms.  07/31/17   [provider]    Family History Family History  Problem Relation Age of Onset  . Breast cancer Mother        Died age 78  . Cancer Mother 77       Breast  . Heart disease Mother   . Hyperlipidemia Mother   . Hypertension Mother   . Heart attack Mother     Social History Social History   Tobacco Use  . Smoking status: Former Smoker    Packs/day: 1.00    Years: 50.00    Pack years: 50.00    Types: Cigarettes    Last attempt to quit: 05/16/1998    Years since quitting: 19.2  . Smokeless tobacco: Never Used  Substance Use Topics  . Alcohol use: No    Alcohol/week: 0.0 oz  . Drug use: No     Allergies   Olmesartan   Review of Systems Review of Systems ROS: Statement: All systems negative except as marked or noted in the HPI; Constitutional: Negative for fever and chills. ; ; Eyes: Negative for eye pain, redness and discharge. ; ; ENMT: Negative for ear pain, hoarseness, nasal congestion, sinus pressure and sore throat.  ; ; Cardiovascular: Negative for chest pain, palpitations, diaphoresis, and peripheral edema. ; ; Respiratory: +SOB. Negative for cough, wheezing and stridor. ; ; Gastrointestinal: Negative for nausea, vomiting, diarrhea, abdominal pain, blood in stool, hematemesis, jaundice and rectal bleeding. . ; ; Genitourinary: Negative for dysuria, flank pain and hematuria. ; ; Musculoskeletal: Negative for back pain and neck pain. Negative for swelling and trauma.; ; Skin: Negative for pruritus, rash, abrasions, blisters, bruising and skin lesion.; ; Neuro: +generalized weakness, lightheadedness. Negative for headache and  neck stiffness. Negative for altered level of consciousness, altered mental status, extremity weakness, paresthesias, involuntary movement, seizure and syncope.       Physical Exam Updated Vital Signs BP (!) 157/68   Pulse 99   Temp 98.6 F (37 C) (Oral)   Resp (!) 25   Ht 5\' 6"  (1.676 m)   Wt 109.3 kg (241 lb)   SpO2 97%   BMI 38.90 kg/m   12:03:04 Orthostatic Vital Signs MM  Orthostatic Lying   BP- Lying: 166/136Abnormal    Pulse- Lying: 93       Orthostatic Sitting  BP- Sitting: 162/72   Pulse- Sitting: 87       Orthostatic Standing at 0 minutes  BP- Standing at 0 minutes: 142/73   Pulse- Standing at 0 minutes: 96       Orthostatic Standing at 3 minutes  BP- Standing at 3 minutes: 157/68   Pulse- Standing at 3 minutes: 99     Physical Exam 1125: Physical examination:  Nursing notes reviewed; Vital signs and O2 SAT reviewed;  Constitutional: Well developed, Well nourished, Well hydrated, In no acute distress; Head:  Normocephalic, atraumatic; Eyes: EOMI, PERRL, No scleral icterus; ENMT: Mouth and pharynx normal, Mucous membranes moist; Neck: Supple, Full range of motion, No lymphadenopathy; Cardiovascular: Regular rate and rhythm, No gallop; Respiratory: Breath sounds clear & equal bilaterally, No wheezes.  Speaking full sentences with ease, Normal respiratory  effort/excursion; Chest: Nontender, Movement normal; Abdomen: Soft, Nontender, Nondistended, Normal bowel sounds; Genitourinary: No CVA tenderness; Extremities: Peripheral pulses normal, No tenderness, No edema, No calf edema or asymmetry.; Neuro: AA&Ox3, Major CN grossly intact.  Speech clear. No gross focal motor or sensory deficits in extremities.; Skin: Color normal, Warm, Dry.; Psych:  Anxious, tearful.     ED Treatments / Results  Labs (all labs ordered are listed, but only abnormal results are displayed)   EKG EKG Interpretation  Date/Time:  Sunday August 13 2017 11:06:12 EDT Ventricular Rate:  95 PR Interval:    QRS Duration: 159 QT Interval:  401 QTC Calculation: 505 R Axis:   -29 Text Interpretation:  Sinus rhythm Left bundle branch block When compared with ECG of 08/05/2017 No significant change was found Confirmed by Francine Graven [phone]) on 08/13/2017 11:49:42 AM   Radiology   Procedures Procedures (including critical care time)  Medications Ordered in ED Medications - No data to display   Initial Impression / Assessment and Plan / ED Course  I have reviewed the triage vital signs and the nursing notes.  Pertinent labs & imaging results that were available during my care of the patient were reviewed by me and considered in my medical decision making (see chart for details).  MDM Reviewed: previous chart, nursing note and vitals Reviewed previous: labs and ECG Interpretation: labs, ECG and CT scan    Results for orders placed or performed during the hospital encounter of 60/45/40  Basic metabolic panel  Result Value Ref Range   Sodium 133 (L) 135 - 145 mmol/L   Potassium 3.9 3.5 - 5.1 mmol/L   Chloride 95 (L) 101 - 111 mmol/L   CO2 24 22 - 32 mmol/L   Glucose, Bld 206 (H) 65 - 99 mg/dL   BUN 26 (H) 6 - 20 mg/dL   Creatinine, Ser 5.14 (H) 0.44 - 1.00 mg/dL   Calcium 9.7 8.9 - 10.3 mg/dL   GFR calc non Af Amer 8 (L) >60 mL/min   GFR calc Af Amer 9  (L) >60  mL/min   Anion gap 14 5 - 15  Brain natriuretic peptide  Result Value Ref Range   B Natriuretic Peptide 1,371.0 (H) 0.0 - 100.0 pg/mL  Troponin I  Result Value Ref Range   Troponin I 0.04 (HH) <0.03 ng/mL  CBC with Differential  Result Value Ref Range   WBC 7.5 4.0 - 10.5 K/uL   RBC 4.51 3.87 - 5.11 MIL/uL   Hemoglobin 11.7 (L) 12.0 - 15.0 g/dL   HCT 36.1 36.0 - 46.0 %   MCV 80.0 78.0 - 100.0 fL   MCH 25.9 (L) 26.0 - 34.0 pg   MCHC 32.4 30.0 - 36.0 g/dL   RDW 14.2 11.5 - 15.5 %   Platelets 165 150 - 400 K/uL   Neutrophils Relative % 66 %   Neutro Abs 4.9 1.7 - 7.7 K/uL   Lymphocytes Relative 19 %   Lymphs Abs 1.4 0.7 - 4.0 K/uL   Monocytes Relative 7 %   Monocytes Absolute 0.5 0.1 - 1.0 K/uL   Eosinophils Relative 7 %   Eosinophils Absolute 0.5 0.0 - 0.7 K/uL   Basophils Relative 1 %   Basophils Absolute 0.0 0.0 - 0.1 K/uL  Troponin I  Result Value Ref Range   Troponin I 0.05 (HH) <0.03 ng/mL  CBG monitoring, ED  Result Value Ref Range   Glucose-Capillary 200 (H) 65 - 99 mg/dL    Ct Head Wo Contrast Result Date: 08/13/2017 CLINICAL DATA:  Generalized muscle weakness. EXAM: CT HEAD WITHOUT CONTRAST TECHNIQUE: Contiguous axial images were obtained from the base of the skull through the vertex without intravenous contrast. COMPARISON:  08/04/2017 FINDINGS: Brain: No evidence of acute infarction, hemorrhage, hydrocephalus, extra-axial collection or mass lesion/mass effect. Mild cerebral volume loss for age. Probable mild chronic small vessel ischemia in the cerebral white matter. Vascular: Atherosclerotic calcification.  No hyperdense vessel. Skull: Negative Sinuses/Orbits: Left cataract resection. IMPRESSION: Senescent changes without acute superimposed finding. Electronically Signed   By: Monte Fantasia M.D.   On: 08/13/2017 13:59   Ct Chest Wo Contrast Result Date: 08/13/2017 CLINICAL DATA:  Dyspnea. EXAM: CT CHEST WITHOUT CONTRAST TECHNIQUE: Multidetector CT imaging  of the chest was performed following the standard protocol without IV contrast. COMPARISON:  10/01/2010 FINDINGS: Cardiovascular: Mild cardiac enlargement. Aortic atherosclerosis. Calcifications within the RCA, LAD and left circumflex coronary arteries identified. Mediastinum/Nodes: Trachea appears patent and is midline. Normal appearance of the esophagus. No enlarged mediastinal or hilar lymph nodes. Status post left axillary nodal dissection. Lungs/Pleura: No pleural effusion. Mild changes of emphysema. 3 mm nodule in the right upper lobe is identified, image 38/4. Within the right middle lobe there is a 7 mm lung nodule, image 80/4. These are both unchanged from previous exam. Upper Abdomen: Stones identified within the gallbladder. Extensive aortic atherosclerosis and branch vessel disease. Musculoskeletal: Degenerative disc disease noted within the thoracic spine. There is advanced degenerative changes involving both glenohumeral joints. IMPRESSION: 1. Small pulmonary nodules are unchanged from 2012 compatible with benign abnormality. 2. Aortic Atherosclerosis (ICD10-I70.0) and Emphysema (ICD10-J43.9). 3. Three vessel coronary artery calcifications. 4. Gallstones. Electronically Signed   By: Kerby Moors M.D.   On: 08/13/2017 14:06   Results for DEZI, BRAUNER (MRN [id number]) as of 08/13/2017 14:22  Ref. Range 08/04/2017 12:27 08/13/2017 11:47  B Natriuretic Peptide Latest Ref Range: 0.0 - 100.0 pg/mL 1,506.3 (H) 1,371.0 (H)    1150:  Pt d/c 1 week ago from [facility] for CP r/o workup: on d/c summary suggested CT-A chest to  be obtained by PMD during f/u.  T/C returned from [facility] Dr. Lowanda Foster, case discussed, including:  HPI, pertinent PM/SHx, VS/PE, dx testing, ED course and treatment:  He knows pt well, it is OK to obtain CT-A if needed, pt will then need HD tomorrow (instead of Tuesday) due to IV dye load if this is completed today.  Pt is agreeable with this.   1415:  ED RN unable to obtain adequate IV  for IV contrast CT.  BNP elevated, but improved from previous and no acute CHF on CXR. Upon Epic chart review: pt did not have troponin cycled during inpt stay (I-stat troponin obtained while in the ED only). Lab troponin elevated today (from baseline). Will obtain 2nd lab troponin and consult Cards MD.  1500: 2nd troponin mildly elevated. T/C returned from [facility] Cards Dr. Marlou Porch, case discussed, including:  HPI, pertinent PM/SHx, VS/PE, dx testing, ED course and treatment:  Agrees that troponin leak probably d/t ESRD/CKD, but since pt does have underlying CAD and did not have enzymes cycled during last admit, OK for pt to stay at [facility] and cycle enzymes overnight, if they significantly elevate (ie: 1.5+) then have [facility] MD call back.   1525:  T/C returned from [facility] Dr. Maudie Mercury, case discussed, including:  HPI, pertinent PM/SHx, VS/PE, dx testing, ED course and treatment:  Agreeable to admit.     Final Clinical Impressions(s) / ED Diagnoses   Final diagnoses:  None    ED Discharge Orders    None       Francine Graven, DO 08/15/17 1254

## 2017-08-13 NOTE — ED Triage Notes (Signed)
[place], patient was getting ready for church this morning, became short of breath, dizzy, weak, denies LOC. Boyfriend called EMS.  Vital signs at time 118/50, HR 107, cbg 187, stated dialysis yesterday per patient.  Recently discharged from [facility] with "chest pain".

## 2017-08-13 NOTE — ED Notes (Signed)
Patient to CT.

## 2017-08-14 ENCOUNTER — Observation Stay (HOSPITAL_COMMUNITY): Payer: Medicare Other

## 2017-08-14 DIAGNOSIS — N186 End stage renal disease: Secondary | ICD-10-CM

## 2017-08-14 DIAGNOSIS — R748 Abnormal levels of other serum enzymes: Secondary | ICD-10-CM | POA: Diagnosis not present

## 2017-08-14 DIAGNOSIS — Z992 Dependence on renal dialysis: Secondary | ICD-10-CM

## 2017-08-14 DIAGNOSIS — E118 Type 2 diabetes mellitus with unspecified complications: Secondary | ICD-10-CM | POA: Diagnosis not present

## 2017-08-14 DIAGNOSIS — R0602 Shortness of breath: Secondary | ICD-10-CM | POA: Diagnosis not present

## 2017-08-14 DIAGNOSIS — Z794 Long term (current) use of insulin: Secondary | ICD-10-CM

## 2017-08-14 DIAGNOSIS — R778 Other specified abnormalities of plasma proteins: Secondary | ICD-10-CM

## 2017-08-14 DIAGNOSIS — R7989 Other specified abnormal findings of blood chemistry: Secondary | ICD-10-CM

## 2017-08-14 DIAGNOSIS — R0609 Other forms of dyspnea: Secondary | ICD-10-CM

## 2017-08-14 LAB — CBC
HEMATOCRIT: 32.9 % — AB (ref 36.0–46.0)
Hemoglobin: 10.6 g/dL — ABNORMAL LOW (ref 12.0–15.0)
MCH: 26 pg (ref 26.0–34.0)
MCHC: 32.2 g/dL (ref 30.0–36.0)
MCV: 80.8 fL (ref 78.0–100.0)
Platelets: 187 10*3/uL (ref 150–400)
RBC: 4.07 MIL/uL (ref 3.87–5.11)
RDW: 14.3 % (ref 11.5–15.5)
WBC: 6.9 10*3/uL (ref 4.0–10.5)

## 2017-08-14 LAB — COMPREHENSIVE METABOLIC PANEL
ALT: 8 U/L — AB (ref 14–54)
AST: 15 U/L (ref 15–41)
Albumin: 3.2 g/dL — ABNORMAL LOW (ref 3.5–5.0)
Alkaline Phosphatase: 45 U/L (ref 38–126)
Anion gap: 14 (ref 5–15)
BUN: 36 mg/dL — AB (ref 6–20)
CHLORIDE: 97 mmol/L — AB (ref 101–111)
CO2: 26 mmol/L (ref 22–32)
Calcium: 9.8 mg/dL (ref 8.9–10.3)
Creatinine, Ser: 6.44 mg/dL — ABNORMAL HIGH (ref 0.44–1.00)
GFR, EST AFRICAN AMERICAN: 7 mL/min — AB (ref >60)
GFR, EST NON AFRICAN AMERICAN: 6 mL/min — AB (ref >60)
Glucose, Bld: 135 mg/dL — ABNORMAL HIGH (ref 65–99)
POTASSIUM: 4.3 mmol/L (ref 3.5–5.1)
SODIUM: 137 mmol/L (ref 135–145)
Total Bilirubin: 0.8 mg/dL (ref 0.3–1.2)
Total Protein: 7.1 g/dL (ref 6.5–8.1)

## 2017-08-14 LAB — GLUCOSE, CAPILLARY
Glucose-Capillary: 187 mg/dL — ABNORMAL HIGH (ref 65–99)
Glucose-Capillary: 84 mg/dL (ref 65–99)
Glucose-Capillary: 162 mg/dL — ABNORMAL HIGH (ref 65–99)

## 2017-08-14 LAB — TROPONIN I
Troponin I: 0.06 ng/mL (ref <0.03)
Troponin I: 0.06 ng/mL (ref <0.03)

## 2017-08-14 MED ORDER — LIDOCAINE HCL (PF) 1 % IJ SOLN: 5.0000 mL | INTRAMUSCULAR | Status: DC | PRN

## 2017-08-14 MED ORDER — PENTAFLUOROPROP-TETRAFLUOROETH EX AERO: 1.0000 "application " | INHALATION_SPRAY | CUTANEOUS | Status: DC | PRN

## 2017-08-14 MED ORDER — IOPAMIDOL (ISOVUE-370) INJECTION 76%
100.0000 mL | Freq: Once | INTRAVENOUS | Status: AC | PRN
  Administered 2017-08-14: 100 mL via INTRAVENOUS

## 2017-08-14 MED ORDER — SODIUM CHLORIDE 0.9 % IV SOLN: 100.0000 mL | INTRAVENOUS | Status: DC | PRN

## 2017-08-14 MED ORDER — INSULIN ASPART 100 UNIT/ML ~~LOC~~ SOLN
0.0000 [IU] | Freq: Three times a day (TID) | SUBCUTANEOUS | Status: DC
  Administered 2017-08-14 (×2): 2 [IU] via SUBCUTANEOUS

## 2017-08-14 MED ORDER — INSULIN ASPART 100 UNIT/ML ~~LOC~~ SOLN: 0.0000 [IU] | Freq: Every day | SUBCUTANEOUS | Status: DC

## 2017-08-14 MED ORDER — LIDOCAINE-PRILOCAINE 2.5-2.5 % EX CREA: 1.0000 "application " | TOPICAL_CREAM | CUTANEOUS | Status: DC | PRN

## 2017-08-14 NOTE — Consult Note (Signed)
Reason for Consult: End-stage renal disease Referring Physician: Dr. Pricilla Holm is an 72 y.o. female.  HPI: She is a patient who has history of hypertension, diabetes, coronary artery disease status post a stent placement and history of end-stage renal disease on maintenance hemodialysis presently came with complaints of some  shoulder pain and  difficulty breathing for the last couple of days.  Patient was dialyzed on Saturday without significant improvement.  Patient was seen last week because of chest pain and during that time she was found to have increased right ventricular dysfunction and also worsening of pulmonary hypertension.  Because of that outpatient CT scan with angiogram was suggested but was not done as patient want to go home..  Presently patient is admitted to the study.  This morning patient complains of some left-sided shoulder pain otherwise feels okay.  Past Medical History:  Diagnosis Date  . Arthritis   . Cancer [facility]) 2005    left breast  . CHF (congestive heart failure) ([place])   . Chronic back pain   . Chronic kidney disease 03/2014   dialysis t/th/sa  . Coronary artery disease   . Diabetes mellitus    Type 2  . Diabetic nephropathy ([place]) 01-08-13  . Dyslipidemia 01-08-13  . ESRD on hemodialysis ([place])    Tu, Th, Sat  . Headache   . Hyperlipidemia   . Hypertension   . LBBB (left bundle branch block)   . Proteinuria 01-08-13  . Thyroid disease 01-08-13   Hyper-parathyroidism-secondary    Past Surgical History:  Procedure Laterality Date  . ABDOMINAL AORTAGRAM N/A 08/11/2014   Procedure: ABDOMINAL Maxcine Ham;  Surgeon: Angelia Mould, MD;  Location: [facility] CATH LAB;  Service: Cardiovascular;  Laterality: N/A;  . ABDOMINAL HYSTERECTOMY    . APPENDECTOMY    . Arm surgery     Left arm trauma  . AV FISTULA PLACEMENT Left 08/21/2014   Procedure: LEFT ARM ARTERIOVENOUS (AV) FISTULA CREATION ;  Surgeon: Angelia Mould, MD;  Location: [facility];  Service:  Vascular;  Laterality: Left;  . BACK SURGERY    . CATARACT EXTRACTION W/PHACO Left 12/22/2014   Procedure: CATARACT EXTRACTION PHACO AND INTRAOCULAR LENS PLACEMENT (IOC);  Surgeon: Tonny Branch, MD;  Location: [facility] ORS;  Service: Ophthalmology;  Laterality: Left;  CDE 13.48  . CORONARY ANGIOPLASTY  12/19/2003   inferior wall hypokinesis. ef 50%  . dialysis catheter    . MASTECTOMY     Left  . PERIPHERAL VASCULAR CATHETERIZATION N/A 09/16/2015   Procedure: Fistulagram;  Surgeon: Rosetta Posner, MD;  Location: [facility] CV LAB;  Service: Cardiovascular;  Laterality: N/A;  . SPINE SURGERY    . TONSILLECTOMY    . TRANSTHORACIC ECHOCARDIOGRAM  10/01/2010    Left ventricle: The cavity size was mildly dilated. Wall thickness was increased in a pattern of mild LVH. Systolic function was   mildly reduced. The estimated ejection fraction was in the range  of 45% to 50%.   . TUBAL LIGATION      Family History  Problem Relation Age of Onset  . Breast cancer Mother        Died age 48  . Cancer Mother 47       Breast  . Heart disease Mother   . Hyperlipidemia Mother   . Hypertension Mother   . Heart attack Mother     Social History:  reports that she quit smoking about 19 years ago. Her smoking use included cigarettes. She has  a 50.00 pack-year smoking history. She has never used smokeless tobacco. She reports that she does not drink alcohol or use drugs.  Allergies:  Allergies  Allergen Reactions  . Olmesartan Other (See Comments)    Hyperkalemia    Medications: I have reviewed the patient's current medications.  Results for orders placed or performed during the hospital encounter of 08/13/17 (from the past 48 hour(s))  CBG monitoring, ED     Status: Abnormal   Collection Time: 08/13/17 11:25 AM  Result Value Ref Range   Glucose-Capillary 200 (H) 65 - 99 mg/dL  Basic metabolic panel     Status: Abnormal   Collection Time: 08/13/17 11:47 AM  Result Value Ref Range   Sodium 133 (L) 135 - 145  mmol/L   Potassium 3.9 3.5 - 5.1 mmol/L   Chloride 95 (L) 101 - 111 mmol/L   CO2 24 22 - 32 mmol/L   Glucose, Bld 206 (H) 65 - 99 mg/dL   BUN 26 (H) 6 - 20 mg/dL   Creatinine, Ser 5.14 (H) 0.44 - 1.00 mg/dL   Calcium 9.7 8.9 - 10.3 mg/dL   GFR calc non Af Amer 8 (L) >60 mL/min   GFR calc Af Amer 9 (L) >60 mL/min    Comment: (NOTE) The eGFR has been calculated using the CKD EPI equation. This calculation has not been validated in all clinical situations. eGFR's persistently <60 mL/min signify possible Chronic Kidney Disease.    Anion gap 14 5 - 15    Comment: Performed at [facility], [address] [place], [place] [address]  Brain natriuretic peptide     Status: Abnormal   Collection Time: 08/13/17 11:47 AM  Result Value Ref Range   B Natriuretic Peptide 1,371.0 (H) 0.0 - 100.0 pg/mL    Comment: Performed at [facility], [address] [place], [place] [address]  Troponin I     Status: Abnormal   Collection Time: 08/13/17 11:47 AM  Result Value Ref Range   Troponin I 0.04 (HH) <0.03 ng/mL    Comment: CRITICAL RESULT CALLED TO, READ BACK BY AND VERIFIED WITH: MINTER,R'@1224'$  BY MATTHEWS, B 3.31.19 Performed at [facility], [address] [place], [place] [address]   CBC with Differential     Status: Abnormal   Collection Time: 08/13/17 11:47 AM  Result Value Ref Range   WBC 7.5 4.0 - 10.5 K/uL   RBC 4.51 3.87 - 5.11 MIL/uL   Hemoglobin 11.7 (L) 12.0 - 15.0 g/dL   HCT 36.1 36.0 - 46.0 %   MCV 80.0 78.0 - 100.0 fL   MCH 25.9 (L) 26.0 - 34.0 pg   MCHC 32.4 30.0 - 36.0 g/dL   RDW 14.2 11.5 - 15.5 %   Platelets 165 150 - 400 K/uL   Neutrophils Relative % 66 %   Neutro Abs 4.9 1.7 - 7.7 K/uL   Lymphocytes Relative 19 %   Lymphs Abs 1.4 0.7 - 4.0 K/uL   Monocytes Relative 7 %   Monocytes Absolute 0.5 0.1 - 1.0 K/uL   Eosinophils Relative 7 %   Eosinophils Absolute 0.5 0.0 - 0.7 K/uL   Basophils Relative 1 %   Basophils Absolute 0.0 0.0 - 0.1 K/uL    Comment: Performed  at [facility], [address], [place], [place] [address]  Troponin I     Status: Abnormal   Collection Time: 08/13/17  1:59 PM  Result Value Ref Range   Troponin I 0.05 (HH) <0.03 ng/mL  Comment: CRITICAL RESULT CALLED TO, READ BACK BY AND VERIFIED WITH: MOORE,M'@1451'$  BY MATTHEWS, B 3.31.19 Performed at [facility], [address] [place], [place] [address]   Troponin I     Status: Abnormal   Collection Time: 08/13/17  8:16 PM  Result Value Ref Range   Troponin I 0.06 (HH) <0.03 ng/mL    Comment: CRITICAL VALUE NOTED.  VALUE IS CONSISTENT WITH PREVIOUSLY REPORTED AND CALLED VALUE. Performed at [facility], [address], [place], [place] [address]   Comprehensive metabolic panel     Status: Abnormal   Collection Time: 08/14/17  3:04 AM  Result Value Ref Range   Sodium 137 135 - 145 mmol/L   Potassium 4.3 3.5 - 5.1 mmol/L   Chloride 97 (L) 101 - 111 mmol/L   CO2 26 22 - 32 mmol/L   Glucose, Bld 135 (H) 65 - 99 mg/dL   BUN 36 (H) 6 - 20 mg/dL   Creatinine, Ser 6.44 (H) 0.44 - 1.00 mg/dL   Calcium 9.8 8.9 - 10.3 mg/dL   Total Protein 7.1 6.5 - 8.1 g/dL   Albumin 3.2 (L) 3.5 - 5.0 g/dL   AST 15 15 - 41 U/L   ALT 8 (L) 14 - 54 U/L   Alkaline Phosphatase 45 38 - 126 U/L   Total Bilirubin 0.8 0.3 - 1.2 mg/dL   GFR calc non Af Amer 6 (L) >60 mL/min   GFR calc Af Amer 7 (L) >60 mL/min    Comment: (NOTE) The eGFR has been calculated using the CKD EPI equation. This calculation has not been validated in all clinical situations. eGFR's persistently <60 mL/min signify possible Chronic Kidney Disease.    Anion gap 14 5 - 15    Comment: Performed at [facility], [address], [place], [place] [address]  CBC     Status: Abnormal   Collection Time: 08/14/17  3:04 AM  Result Value Ref Range   WBC 6.9 4.0 - 10.5 K/uL   RBC 4.07 3.87 - 5.11 MIL/uL   Hemoglobin 10.6 (L) 12.0 - 15.0 g/dL   HCT 32.9 (L) 36.0 - 46.0 %   MCV 80.8 78.0 - 100.0 fL   MCH 26.0 26.0 - 34.0 pg   MCHC 32.2  30.0 - 36.0 g/dL   RDW 14.3 11.5 - 15.5 %   Platelets 187 150 - 400 K/uL    Comment: Performed at [facility], [address], [place], [place] [address]  Troponin I     Status: Abnormal   Collection Time: 08/14/17  3:04 AM  Result Value Ref Range   Troponin I 0.06 (HH) <0.03 ng/mL    Comment: CRITICAL VALUE NOTED.  VALUE IS CONSISTENT WITH PREVIOUSLY REPORTED AND CALLED VALUE. Performed at [facility], [address], [place], [place] [address]     Ct Head Wo Contrast  Result Date: 08/13/2017 CLINICAL DATA:  Generalized muscle weakness. EXAM: CT HEAD WITHOUT CONTRAST TECHNIQUE: Contiguous axial images were obtained from the base of the skull through the vertex without intravenous contrast. COMPARISON:  08/04/2017 FINDINGS: Brain: No evidence of acute infarction, hemorrhage, hydrocephalus, extra-axial collection or mass lesion/mass effect. Mild cerebral volume loss for age. Probable mild chronic small vessel ischemia in the cerebral white matter. Vascular: Atherosclerotic calcification.  No hyperdense vessel. Skull: Negative Sinuses/Orbits: Left cataract resection. IMPRESSION: Senescent changes without acute superimposed finding. Electronically Signed   By: Monte Fantasia M.D.   On: 08/13/2017 13:59   Ct Chest Wo Contrast  Result Date:  08/13/2017 CLINICAL DATA:  Dyspnea. EXAM: CT CHEST WITHOUT CONTRAST TECHNIQUE: Multidetector CT imaging of the chest was performed following the standard protocol without IV contrast. COMPARISON:  10/01/2010 FINDINGS: Cardiovascular: Mild cardiac enlargement. Aortic atherosclerosis. Calcifications within the RCA, LAD and left circumflex coronary arteries identified. Mediastinum/Nodes: Trachea appears patent and is midline. Normal appearance of the esophagus. No enlarged mediastinal or hilar lymph nodes. Status post left axillary nodal dissection. Lungs/Pleura: No pleural effusion. Mild changes of emphysema. 3 mm nodule in the right upper lobe is identified, image  38/4. Within the right middle lobe there is a 7 mm lung nodule, image 80/4. These are both unchanged from previous exam. Upper Abdomen: Stones identified within the gallbladder. Extensive aortic atherosclerosis and branch vessel disease. Musculoskeletal: Degenerative disc disease noted within the thoracic spine. There is advanced degenerative changes involving both glenohumeral joints. IMPRESSION: 1. Small pulmonary nodules are unchanged from 2012 compatible with benign abnormality. 2. Aortic Atherosclerosis (ICD10-I70.0) and Emphysema (ICD10-J43.9). 3. Three vessel coronary artery calcifications. 4. Gallstones. Electronically Signed   By: Kerby Moors M.D.   On: 08/13/2017 14:06    Review of Systems  Constitutional: Negative for chills and fever.  Respiratory: Positive for shortness of breath.   Cardiovascular: Positive for orthopnea. Negative for chest pain.  Gastrointestinal: Negative for nausea and vomiting.  Musculoskeletal:       Shoulder pain   Blood pressure 123/61, pulse 77, temperature 98.1 F (36.7 C), temperature source Oral, resp. rate (!) 21, height '5\' 6"'$  (1.676 m), weight 107.5 kg (236 lb 15.9 oz), SpO2 95 %. Physical Exam  Constitutional: She is oriented to person, place, and time. No distress.  Eyes: No scleral icterus.  Neck: No JVD present.  Cardiovascular: Normal rate and regular rhythm.  Respiratory: No respiratory distress. She has no wheezes.  GI: She exhibits no distension. There is no tenderness.  Musculoskeletal: She exhibits edema.  Neurological: She is alert and oriented to person, place, and time.    Assessment/Plan: 1] difficulty breathing: Patient does not have any significant sign of fluid overload.  Also does not improve with dialysis.  Presently patient is admitted for CT scan with angiogram. 2] end-stage renal disease: She is status post hemodialysis on Saturday.  Her potassium is normal.  Patient presently does not have any uremic signs and  symptoms. 3] hypertension: Her blood pressure is reasonably controlled 4] anemia 5] history of diabetes 6] bone and mineral disorder: Her calcium is 0 range 7] coronary artery disease: Status post a stent placement.  Presently she did not have any chest pain. Plan: We will make arrangement for her to get dialysis today after CT scan.  If she is not going to have CAT scan done today then will dialyze her tomorrow which is her regular schedule. 2] will remove about 3 L if systolic blood pressure remains above 90 3] we will check a CBC and renal panel in the morning.  Shafin Pollio S 08/14/2017, 7:56 AM

## 2017-08-14 NOTE — Procedures (Signed)
    HEMODIALYSIS TREATMENT NOTE:   3 hour heparin-free dialysis completed via left upper arm AVF (15g/antegrade). Goal NOT met: Pt terminated session 1 hour early/AMA stating, "I have to go to my regular dialysis tomorrow."  Net UF 2 L.  She is being discharged after this treatment and reports no complaints.  Hemodynamically stable, afebrile, saturating 97% on room air.  Report called to Sharyn Lull, Therapist, sports.  Rockwell Alexandria, RN, CDN

## 2017-08-14 NOTE — Care Management Obs Status (Signed)
[place] NOTIFICATION   Patient Details  Name: AZIYA ARENA MRN: [id number] Date of Birth: [birth year]   Medicare Observation Status Notification Given:  Yes    Ezri Fanguy, Chauncey Reading, RN 08/14/2017, 10:53 AM

## 2017-08-14 NOTE — Progress Notes (Signed)
PROGRESS NOTE  Alexis [name] CXK:[id number] DOB: 02-21-1946 DOA: 08/13/2017 PCP: Nolene Ebbs, MD  Brief History:  72 year old female with a history of coronary artery disease with PCI/stent 2005, ESRD (t-Th-Sat), systolic and diastolic CHF, hyperlipidemia, hypertension presenting with acute onset of shortness of breath and near syncope.  The patient states that she was getting ready for church on the morning of 08/13/2017 when she developed shortness of breath and near syncope causing her to have to sit down.  Patient denied any fevers, chills chest pain, nausea, vomiting, abdominal pain.  For the most part, the patient has been compliant with dialysis in the outpatient setting.  She did cut dialysis short approximately 55 minutes on 08/10/2017, but she completed a full dialysis on 08/12/2017.  The patient states that she has been having intermittent PND type symptoms.  She was recently admitted to [facility] from 08/04/17 through 08/05/2017 for chest pain.  Cardiology did not recommend any further workup at that time.  The patient has been afebrile hemodynamically stable saturating 99% on room air since admission.  She states that she has not had any dyspnea or chest discomfort since admission.  Assessment/Plan: Dyspnea and near syncope -Etiology unclear, but concerned that this may be related to the patient's cardiomyopathy and pulmonary hypertension -Suspect this is multifactorial including deconditioning -08/05/2017 echo EF 40-45%, grade 2 DD, PAS P 73, moderate MR, mild to moderate TR -08/13/2017 CT chest without contrast--stable pulmonary nodules.  Mild emphysema -Try to place IV and obtain CT angiogram chest  ESRD -She will be due for dialysis on 08/15/2017  Essential hypertension -Continue Coreg, hydralazine, Imdur  Coronary artery disease -No chest pain presently -Continue aspirin  Elevated troponin -Likely secondary to ESRD -trend is flat  Diabetes mellitus type 2 -Check  hemoglobin A1c -NovoLog sliding scale  RLE edema -venous duplex    Disposition Plan:   Home 4/1 or  4/2 if stable Family Communication:  Spouse updated at bedside--Total time spent 35 minutes.  Greater than 50% spent face to face counseling and coordinating care.   Consultants:  Renal, cardiology  Code Status:  FULL   DVT Prophylaxis:  [place] Heparin   Procedures: As Listed in Progress Note Above  Antibiotics: None    Subjective: Patient denies fevers, chills, headache, chest pain, dyspnea, nausea, vomiting, diarrhea, abdominal pain, dysuria, hematuria, hematochezia, and melena.   Objective: Vitals:   08/13/17 1400 08/13/17 1728 08/13/17 2100 08/14/17 0500  BP: (!) 139/56 (!) 181/60 128/80 123/61  Pulse: 87 81 87 77  Resp: (!) 23 20 20  (!) 21  Temp:  98.7 F (37.1 C) 98.5 F (36.9 C) 98.1 F (36.7 C)  TempSrc:  Oral Oral Oral  SpO2: 98% 99% 95% 95%  Weight:  107.4 kg (236 lb 12.4 oz)  107.5 kg (236 lb 15.9 oz)  Height:  5\' 6"  (1.676 m)      Intake/Output Summary (Last 24 hours) at 08/14/2017 0736 Last data filed at 08/13/2017 1730 Gross per 24 hour  Intake 240 ml  Output -  Net 240 ml   Weight change:  Exam:   General:  Pt is alert, follows commands appropriately, not in acute distress  HEENT: No icterus, No thrush, No neck mass, [place]/AT  Cardiovascular: RRR, S1/S2, no rubs, no gallops  Respiratory: CTA bilaterally, no wheezing, no crackles, no rhonchi  Abdomen: Soft/+BS, non tender, non distended, no guarding  Extremities: 1+ RLE edema, No lymphangitis, No petechiae, No  rashes, no synovitis   Data Reviewed: I have personally reviewed following labs and imaging studies Basic Metabolic Panel: Recent Labs  Lab 08/13/17 1147 08/14/17 0304  NA 133* 137  K 3.9 4.3  CL 95* 97*  CO2 24 26  GLUCOSE 206* 135*  BUN 26* 36*  CREATININE 5.14* 6.44*  CALCIUM 9.7 9.8   Liver Function Tests: Recent Labs  Lab 08/14/17 0304  AST 15  ALT 8*  ALKPHOS  45  BILITOT 0.8  PROT 7.1  ALBUMIN 3.2*   No results for input(s): LIPASE, AMYLASE in the last 168 hours. No results for input(s): AMMONIA in the last 168 hours. Coagulation Profile: No results for input(s): INR, PROTIME in the last 168 hours. CBC: Recent Labs  Lab 08/13/17 1147 08/14/17 0304  WBC 7.5 6.9  NEUTROABS 4.9  --   HGB 11.7* 10.6*  HCT 36.1 32.9*  MCV 80.0 80.8  PLT 165 187   Cardiac Enzymes: Recent Labs  Lab 08/13/17 1147 08/13/17 1359 08/13/17 2016 08/14/17 0304  TROPONINI 0.04* 0.05* 0.06* 0.06*   BNP: Invalid input(s): POCBNP CBG: Recent Labs  Lab 08/13/17 1125  GLUCAP 200*   HbA1C: No results for input(s): HGBA1C in the last 72 hours. Urine analysis:    Component Value Date/Time   COLORURINE YELLOW 08/19/2012 1126   APPEARANCEUR CLOUDY (A) 08/19/2012 1126   LABSPEC 1.018 08/19/2012 1126   PHURINE 5.0 08/19/2012 1126   GLUCOSEU NEGATIVE 08/19/2012 1126   HGBUR SMALL (A) 08/19/2012 1126   [place] 08/19/2012 1126   KETONESUR NEGATIVE 08/19/2012 1126   PROTEINUR >300 (A) 08/19/2012 1126   UROBILINOGEN 0.2 08/19/2012 1126   NITRITE NEGATIVE 08/19/2012 1126   LEUKOCYTESUR SMALL (A) 08/19/2012 1126   Sepsis Labs: @LABRCNTIP (procalcitonin:4,lacticidven:4) ) Recent Results (from the past 240 hour(s))  MRSA PCR Screening     Status: None   Collection Time: 08/05/17  6:31 AM  Result Value Ref Range Status   MRSA by PCR NEGATIVE NEGATIVE Final    Comment:        The GeneXpert MRSA Assay (FDA approved for NASAL specimens only), is one component of a comprehensive MRSA colonization surveillance program. It is not intended to diagnose MRSA infection nor to guide or monitor treatment for MRSA infections. Performed at [facility], [place] [address], [place], [place] [address]      Scheduled Meds: . aspirin EC  81 mg Oral Daily  . carvedilol  25 mg Oral BID WC  . ferric citrate  420 mg Oral TID WC  . heparin  5,000 Units  Subcutaneous Q8H  . hydrALAZINE  100 mg Oral Q8H  . isosorbide mononitrate  30 mg Oral Daily  . pneumococcal 23 valent vaccine  0.5 mL Intramuscular Tomorrow-1000  . sevelamer carbonate  800 mg Oral TID WC  . sodium chloride flush  3 mL Intravenous Q12H   Continuous Infusions: . sodium chloride      Procedures/Studies: Dg Chest 2 View  Result Date: 08/04/2017 CLINICAL DATA:  Chest pain and shortness of breath. Left arm numbness. EXAM: CHEST - 2 VIEW COMPARISON:  07/13/2016 FINDINGS: Postsurgical changes of the left chest wall/axilla. The cardiac silhouette is enlarged. Mediastinal contours appear intact. Calcific atherosclerotic disease of the aorta. There is no evidence of focal airspace consolidation, pleural effusion or pneumothorax. Pulmonary vascular congestion. Osseous structures are without acute abnormality. Soft tissues are grossly normal. IMPRESSION: Moderately enlarged cardiac silhouette. Pulmonary vascular congestion. Calcific atherosclerotic disease of the aorta. Electronically Signed  By: Fidela Salisbury M.D.   On: 08/04/2017 12:49   Ct Head Wo Contrast  Result Date: 08/13/2017 CLINICAL DATA:  Generalized muscle weakness. EXAM: CT HEAD WITHOUT CONTRAST TECHNIQUE: Contiguous axial images were obtained from the base of the skull through the vertex without intravenous contrast. COMPARISON:  08/04/2017 FINDINGS: Brain: No evidence of acute infarction, hemorrhage, hydrocephalus, extra-axial collection or mass lesion/mass effect. Mild cerebral volume loss for age. Probable mild chronic small vessel ischemia in the cerebral white matter. Vascular: Atherosclerotic calcification.  No hyperdense vessel. Skull: Negative Sinuses/Orbits: Left cataract resection. IMPRESSION: Senescent changes without acute superimposed finding. Electronically Signed   By: Monte Fantasia M.D.   On: 08/13/2017 13:59   Ct Head Wo Contrast  Result Date: 08/04/2017 CLINICAL DATA:  Right-sided head pain  beginning yesterday. EXAM: CT HEAD WITHOUT CONTRAST TECHNIQUE: Contiguous axial images were obtained from the base of the skull through the vertex without intravenous contrast. COMPARISON:  CT head without contrast 09/28/2016 FINDINGS: Brain: Mild atrophy and white matter changes are stable. No acute infarct, hemorrhage, or mass lesion is present. The ventricles are proportionate to the degree of atrophy. No significant extra-axial fluid collection is present. Vascular: Dense atherosclerotic calcifications are present within the cavernous internal carotid arteries bilaterally. There is no hyperdense vessel. Calcifications are present at the dural margin of both vertebral arteries. Skull: Calvarium is intact. No focal lytic or blastic lesions are present. No significant extracranial soft tissue injury is evident. Sinuses/Orbits: The paranasal sinuses and mastoid air cells are clear. A left lens replacement is again noted. Globes and orbits are otherwise normal. IMPRESSION: 1. No acute intracranial abnormality. 2. Stable atrophy and white matter disease. This likely reflects the sequela of chronic microvascular ischemia. Electronically Signed   By: San Morelle M.D.   On: 08/04/2017 19:39   Ct Chest Wo Contrast  Result Date: 08/13/2017 CLINICAL DATA:  Dyspnea. EXAM: CT CHEST WITHOUT CONTRAST TECHNIQUE: Multidetector CT imaging of the chest was performed following the standard protocol without IV contrast. COMPARISON:  10/01/2010 FINDINGS: Cardiovascular: Mild cardiac enlargement. Aortic atherosclerosis. Calcifications within the RCA, LAD and left circumflex coronary arteries identified. Mediastinum/Nodes: Trachea appears patent and is midline. Normal appearance of the esophagus. No enlarged mediastinal or hilar lymph nodes. Status post left axillary nodal dissection. Lungs/Pleura: No pleural effusion. Mild changes of emphysema. 3 mm nodule in the right upper lobe is identified, image 38/4. Within the right  middle lobe there is a 7 mm lung nodule, image 80/4. These are both unchanged from previous exam. Upper Abdomen: Stones identified within the gallbladder. Extensive aortic atherosclerosis and branch vessel disease. Musculoskeletal: Degenerative disc disease noted within the thoracic spine. There is advanced degenerative changes involving both glenohumeral joints. IMPRESSION: 1. Small pulmonary nodules are unchanged from 2012 compatible with benign abnormality. 2. Aortic Atherosclerosis (ICD10-I70.0) and Emphysema (ICD10-J43.9). 3. Three vessel coronary artery calcifications. 4. Gallstones. Electronically Signed   By: Kerby Moors M.D.   On: 08/13/2017 14:06    Orson Eva, DO  [facility] Pager [phone]  If 7PM-7AM, please contact night-coverage [email or web address] Password [email or web address] 08/14/2017, 7:36 AM   LOS: 0 days

## 2017-08-14 NOTE — Discharge Summary (Signed)
,    Physician Discharge Summary  Alexis [name] [facility]:[id number] DOB: 1945/07/27 DOA: 08/13/2017  PCP: Nolene Ebbs, MD  Admit date: 08/13/2017 Discharge date: 08/14/2017  Admitted From: Home Disposition:  Home  Recommendations for Outpatient Follow-up:  1. Follow up with PCP in 1-2 weeks 2. Please obtain BMP/CBC in one week 3. Follow up with cardiology as outpt   Discharge Condition: Stable CODE STATUS: FULL Diet recommendation: Heart Healthy   Brief/Interim Summary: 72 year old female with a history of coronary artery disease with PCI/stent 2005, ESRD (t-Th-Sat), systolic and diastolic CHF, hyperlipidemia, hypertension presenting with acute onset of shortness of breath and near syncope.  The patient states that she was getting ready for church on the morning of 08/13/2017 when she developed shortness of breath and near syncope causing her to have to sit down.  Patient denied any fevers, chills chest pain, nausea, vomiting, abdominal pain.  For the most part, the patient has been compliant with dialysis in the outpatient setting.  She did cut dialysis short approximately 55 minutes on 08/10/2017, but she completed a full dialysis on 08/12/2017.  The patient states that she has been having intermittent PND type symptoms.  She was recently admitted to [facility] from 08/04/17 through 08/05/2017 for chest pain.  Cardiology did not recommend any further workup at that time.  The patient has been afebrile hemodynamically stable saturating 99% on room air since admission.  She states that she has not had any dyspnea or chest discomfort since admission.  CT angiogram of the chest was subsequently obtained and was negative for pulmonary embolus.  The patient remained asymptomatic throughout the hospitalization.  The case was discussed with cardiology who felt the patient was appropriate for outpatient follow-up.    Discharge Diagnoses:  Dyspnea and near syncope -Etiology unclear, but concerned that  this may be related to the patient's cardiomyopathy and pulmonary hypertension -Suspect this is multifactorial including deconditioning -08/05/2017 echo EF 40-45%, grade 2 DD, PASP 73, moderate MR, mild to moderate TR -08/13/2017 CT chest without contrast--stable pulmonary nodules.  Mild emphysema -08/14/17 CTA chest--RUL and RML lung nodules unchanged, bibasilar atelectasis, no PE or dissection -Patient remained hemodynamically stable and asymptomatic throughout the hospitalization.  She did not have any hypoxia maintaining oxygen saturation 98-99% on room air. -Case was discussed with cardiology--> appropriate for outpatient follow-up  ESRD -Had HD 08/14/17  Essential hypertension -Continue Coreg, hydralazine, Imdur  Coronary artery disease -No chest pain presently -Continue aspirin  Elevated troponin -Likely secondary to ESRD -trend is flat  Diabetes mellitus type 2 -CBGs remained controlled -NovoLog sliding scale      Discharge Instructions  Discharge Instructions    Diet - low sodium heart healthy   Complete by:  As directed    Increase activity slowly   Complete by:  As directed      Allergies as of 08/14/2017      Reactions   Olmesartan Other (See Comments)   Hyperkalemia      Medication List    TAKE these medications   aspirin 81 MG EC tablet Take 1 tablet (81 mg total) by mouth daily.   AURYXIA 1 GM 210 MG(Fe) tablet Generic drug:  ferric citrate Take 420 mg by mouth 3 (three) times daily with meals.   carvedilol 25 MG tablet Commonly known as:  COREG Take 25 mg by mouth 2 (two) times daily with a meal.   docusate sodium 100 MG capsule Commonly known as:  COLACE Take 100 mg by mouth  daily as needed for mild constipation.   hydrALAZINE 100 MG tablet Commonly known as:  APRESOLINE Take 100 mg by mouth 3 (three) times daily.   hydrOXYzine 25 MG tablet Commonly known as:  ATARAX/VISTARIL Take 25 mg by mouth 2 (two) times daily as needed for  itching.   insulin aspart protamine- aspart (70-30) 100 UNIT/ML injection Commonly known as:  NOVOLOG MIX 70/30 Inject 20-25 Units into the skin 2 (two) times daily with a meal. 20 UNITS IN THE MORNING AND 25 UNITS AT BEDTIME   isosorbide mononitrate 30 MG 24 hr tablet Commonly known as:  IMDUR Take 30 mg by mouth daily.   nitroGLYCERIN 0.4 MG SL tablet Commonly known as:  NITROSTAT Place 0.4 mg under the tongue every 5 (five) minutes as needed for chest pain.   sevelamer carbonate 800 MG tablet Commonly known as:  RENVELA Take 800 mg by mouth 3 (three) times daily with meals.       Allergies  Allergen Reactions  . Olmesartan Other (See Comments)    Hyperkalemia    Consultations:  renal   Procedures/Studies: Dg Chest 2 View  Result Date: 08/04/2017 CLINICAL DATA:  Chest pain and shortness of breath. Left arm numbness. EXAM: CHEST - 2 VIEW COMPARISON:  07/13/2016 FINDINGS: Postsurgical changes of the left chest wall/axilla. The cardiac silhouette is enlarged. Mediastinal contours appear intact. Calcific atherosclerotic disease of the aorta. There is no evidence of focal airspace consolidation, pleural effusion or pneumothorax. Pulmonary vascular congestion. Osseous structures are without acute abnormality. Soft tissues are grossly normal. IMPRESSION: Moderately enlarged cardiac silhouette. Pulmonary vascular congestion. Calcific atherosclerotic disease of the aorta. Electronically Signed   By: Fidela Salisbury M.D.   On: 08/04/2017 12:49   Ct Head Wo Contrast  Result Date: 08/13/2017 CLINICAL DATA:  Generalized muscle weakness. EXAM: CT HEAD WITHOUT CONTRAST TECHNIQUE: Contiguous axial images were obtained from the base of the skull through the vertex without intravenous contrast. COMPARISON:  08/04/2017 FINDINGS: Brain: No evidence of acute infarction, hemorrhage, hydrocephalus, extra-axial collection or mass lesion/mass effect. Mild cerebral volume loss for age. Probable  mild chronic small vessel ischemia in the cerebral white matter. Vascular: Atherosclerotic calcification.  No hyperdense vessel. Skull: Negative Sinuses/Orbits: Left cataract resection. IMPRESSION: Senescent changes without acute superimposed finding. Electronically Signed   By: Monte Fantasia M.D.   On: 08/13/2017 13:59   Ct Head Wo Contrast  Result Date: 08/04/2017 CLINICAL DATA:  Right-sided head pain beginning yesterday. EXAM: CT HEAD WITHOUT CONTRAST TECHNIQUE: Contiguous axial images were obtained from the base of the skull through the vertex without intravenous contrast. COMPARISON:  CT head without contrast 09/28/2016 FINDINGS: Brain: Mild atrophy and white matter changes are stable. No acute infarct, hemorrhage, or mass lesion is present. The ventricles are proportionate to the degree of atrophy. No significant extra-axial fluid collection is present. Vascular: Dense atherosclerotic calcifications are present within the cavernous internal carotid arteries bilaterally. There is no hyperdense vessel. Calcifications are present at the dural margin of both vertebral arteries. Skull: Calvarium is intact. No focal lytic or blastic lesions are present. No significant extracranial soft tissue injury is evident. Sinuses/Orbits: The paranasal sinuses and mastoid air cells are clear. A left lens replacement is again noted. Globes and orbits are otherwise normal. IMPRESSION: 1. No acute intracranial abnormality. 2. Stable atrophy and white matter disease. This likely reflects the sequela of chronic microvascular ischemia. Electronically Signed   By: San Morelle M.D.   On: 08/04/2017 19:39   Ct Chest Wo  Contrast  Result Date: 08/13/2017 CLINICAL DATA:  Dyspnea. EXAM: CT CHEST WITHOUT CONTRAST TECHNIQUE: Multidetector CT imaging of the chest was performed following the standard protocol without IV contrast. COMPARISON:  10/01/2010 FINDINGS: Cardiovascular: Mild cardiac enlargement. Aortic  atherosclerosis. Calcifications within the RCA, LAD and left circumflex coronary arteries identified. Mediastinum/Nodes: Trachea appears patent and is midline. Normal appearance of the esophagus. No enlarged mediastinal or hilar lymph nodes. Status post left axillary nodal dissection. Lungs/Pleura: No pleural effusion. Mild changes of emphysema. 3 mm nodule in the right upper lobe is identified, image 38/4. Within the right middle lobe there is a 7 mm lung nodule, image 80/4. These are both unchanged from previous exam. Upper Abdomen: Stones identified within the gallbladder. Extensive aortic atherosclerosis and branch vessel disease. Musculoskeletal: Degenerative disc disease noted within the thoracic spine. There is advanced degenerative changes involving both glenohumeral joints. IMPRESSION: 1. Small pulmonary nodules are unchanged from 2012 compatible with benign abnormality. 2. Aortic Atherosclerosis (ICD10-I70.0) and Emphysema (ICD10-J43.9). 3. Three vessel coronary artery calcifications. 4. Gallstones. Electronically Signed   By: Kerby Moors M.D.   On: 08/13/2017 14:06   Ct Angio Chest Pe W Or Wo Contrast  Result Date: 08/14/2017 CLINICAL DATA:  History of left-sided breast cancer, hypertension and CHF, now with shortness of breath. Evaluate embolism. EXAM: CT ANGIOGRAPHY CHEST WITH CONTRAST TECHNIQUE: Multidetector CT imaging of the chest was performed using the standard protocol during bolus administration of intravenous contrast. Multiplanar CT image reconstructions and MIPs were obtained to evaluate the vascular anatomy. CONTRAST:  187mL ISOVUE-370 IOPAMIDOL (ISOVUE-370) INJECTION 76% COMPARISON:  Chest CT-08/13/2017; 10/01/2010 FINDINGS: Vascular Findings: There is adequate opacification of the pulmonary arterial system with the main pulmonary artery measuring 332 Hounsfield units. There no discrete filling defects within the pulmonary arterial tree to suggest pulmonary embolism. Normal caliber the  main pulmonary artery. Cardiomegaly. Coronary artery calcifications. Calcifications within the mitral valve annulus. Small amount of pericardial fluid, presumably physiologic. Scattered atherosclerotic plaque within a normal caliber thoracic aorta. No definite evidence of thoracic aortic dissection or periaortic stranding on this nongated examination. Conventional configuration of the aortic arch. The branch vessels of the aortic arch appear patent throughout their imaged course. Review of the MIP images confirms the above findings. ---------------------------------------------------------------------------------- Nonvascular Findings: Mediastinum/Lymph Nodes: No bulky mediastinal, hilar or axillary lymphadenopathy. Lungs/Pleura: Evaluation the pulmonary parenchyma is degraded secondary to combination of patient respiratory artifact as well as quantum mottle artifact due to patient body habitus. There is minimal subsegmental atelectasis, most conspicuous within the bilateral lung bases. No discrete focal airspace opacities. Re demonstrated punctate (approximately 2 mm) nodule within the subpleural aspect the right upper lobe (image 30, series 6) as well as an approximately 0.6 cm nodule within the peripheral subpleural aspect of the right middle lobe (image 64, series 6), both of which are unchanged compared to the 09/2010 examination and thus of benign etiology. No new discrete pulmonary nodules. No pleural effusion or pneumothorax. The central pulmonary airways appear widely patent. Upper abdomen: Limited early arterial phase evaluation of the upper abdomen demonstrates atherosclerotic plaque within the abdominal aorta as well as the splenic artery. Large amount of debris is seen within the stomach. Radiopaque gallstones are seen within neck of the gallbladder. Suspected atrophy of the bilateral kidneys, incompletely evaluated. Musculoskeletal: Post left-sided mastectomy and left axillary adenectomy. No acute or  aggressive osseous abnormalities regional soft tissues appear normal. Normal appearance of the thyroid gland. IMPRESSION: 1. No acute cardiopulmonary disease. Specifically, no evidence of pulmonary embolism.  2. Cardiomegaly. 3. Coronary artery calcifications. Aortic Atherosclerosis (ICD10-I70.0). 4. Cholelithiasis. Electronically Signed   By: Sandi Mariscal M.D.   On: 08/14/2017 17:04        Discharge Exam: Vitals:   08/14/17 1705 08/14/17 1730  BP: (!) 149/66 (!) 113/58  Pulse: 82 92  Resp:    Temp:    SpO2:     Vitals:   08/14/17 0500 08/14/17 1650 08/14/17 1705 08/14/17 1730  BP: 123/61 (!) 159/75 (!) 149/66 (!) 113/58  Pulse: 77 82 82 92  Resp: (!) 21 20    Temp: 98.1 F (36.7 C) 98 F (36.7 C)    TempSrc: Oral Oral    SpO2: 95% 97%    Weight: 107.5 kg (236 lb 15.9 oz) 107.9 kg (237 lb 14 oz)    Height:        General: Pt is alert, awake, not in acute distress Cardiovascular: RRR, S1/S2 +, no rubs, no gallops Respiratory: CTA bilaterally, no wheezing, no rhonchi Abdominal: Soft, NT, ND, bowel sounds + Extremities: no edema, no cyanosis   The results of significant diagnostics from this hospitalization (including imaging, microbiology, ancillary and laboratory) are listed below for reference.    Significant Diagnostic Studies: Dg Chest 2 View  Result Date: 08/04/2017 CLINICAL DATA:  Chest pain and shortness of breath. Left arm numbness. EXAM: CHEST - 2 VIEW COMPARISON:  07/13/2016 FINDINGS: Postsurgical changes of the left chest wall/axilla. The cardiac silhouette is enlarged. Mediastinal contours appear intact. Calcific atherosclerotic disease of the aorta. There is no evidence of focal airspace consolidation, pleural effusion or pneumothorax. Pulmonary vascular congestion. Osseous structures are without acute abnormality. Soft tissues are grossly normal. IMPRESSION: Moderately enlarged cardiac silhouette. Pulmonary vascular congestion. Calcific atherosclerotic disease of  the aorta. Electronically Signed   By: Fidela Salisbury M.D.   On: 08/04/2017 12:49   Ct Head Wo Contrast  Result Date: 08/13/2017 CLINICAL DATA:  Generalized muscle weakness. EXAM: CT HEAD WITHOUT CONTRAST TECHNIQUE: Contiguous axial images were obtained from the base of the skull through the vertex without intravenous contrast. COMPARISON:  08/04/2017 FINDINGS: Brain: No evidence of acute infarction, hemorrhage, hydrocephalus, extra-axial collection or mass lesion/mass effect. Mild cerebral volume loss for age. Probable mild chronic small vessel ischemia in the cerebral white matter. Vascular: Atherosclerotic calcification.  No hyperdense vessel. Skull: Negative Sinuses/Orbits: Left cataract resection. IMPRESSION: Senescent changes without acute superimposed finding. Electronically Signed   By: Monte Fantasia M.D.   On: 08/13/2017 13:59   Ct Head Wo Contrast  Result Date: 08/04/2017 CLINICAL DATA:  Right-sided head pain beginning yesterday. EXAM: CT HEAD WITHOUT CONTRAST TECHNIQUE: Contiguous axial images were obtained from the base of the skull through the vertex without intravenous contrast. COMPARISON:  CT head without contrast 09/28/2016 FINDINGS: Brain: Mild atrophy and white matter changes are stable. No acute infarct, hemorrhage, or mass lesion is present. The ventricles are proportionate to the degree of atrophy. No significant extra-axial fluid collection is present. Vascular: Dense atherosclerotic calcifications are present within the cavernous internal carotid arteries bilaterally. There is no hyperdense vessel. Calcifications are present at the dural margin of both vertebral arteries. Skull: Calvarium is intact. No focal lytic or blastic lesions are present. No significant extracranial soft tissue injury is evident. Sinuses/Orbits: The paranasal sinuses and mastoid air cells are clear. A left lens replacement is again noted. Globes and orbits are otherwise normal. IMPRESSION: 1. No acute  intracranial abnormality. 2. Stable atrophy and white matter disease. This likely reflects the sequela of chronic  microvascular ischemia. Electronically Signed   By: San Morelle M.D.   On: 08/04/2017 19:39   Ct Chest Wo Contrast  Result Date: 08/13/2017 CLINICAL DATA:  Dyspnea. EXAM: CT CHEST WITHOUT CONTRAST TECHNIQUE: Multidetector CT imaging of the chest was performed following the standard protocol without IV contrast. COMPARISON:  10/01/2010 FINDINGS: Cardiovascular: Mild cardiac enlargement. Aortic atherosclerosis. Calcifications within the RCA, LAD and left circumflex coronary arteries identified. Mediastinum/Nodes: Trachea appears patent and is midline. Normal appearance of the esophagus. No enlarged mediastinal or hilar lymph nodes. Status post left axillary nodal dissection. Lungs/Pleura: No pleural effusion. Mild changes of emphysema. 3 mm nodule in the right upper lobe is identified, image 38/4. Within the right middle lobe there is a 7 mm lung nodule, image 80/4. These are both unchanged from previous exam. Upper Abdomen: Stones identified within the gallbladder. Extensive aortic atherosclerosis and branch vessel disease. Musculoskeletal: Degenerative disc disease noted within the thoracic spine. There is advanced degenerative changes involving both glenohumeral joints. IMPRESSION: 1. Small pulmonary nodules are unchanged from 2012 compatible with benign abnormality. 2. Aortic Atherosclerosis (ICD10-I70.0) and Emphysema (ICD10-J43.9). 3. Three vessel coronary artery calcifications. 4. Gallstones. Electronically Signed   By: Kerby Moors M.D.   On: 08/13/2017 14:06   Ct Angio Chest Pe W Or Wo Contrast  Result Date: 08/14/2017 CLINICAL DATA:  History of left-sided breast cancer, hypertension and CHF, now with shortness of breath. Evaluate embolism. EXAM: CT ANGIOGRAPHY CHEST WITH CONTRAST TECHNIQUE: Multidetector CT imaging of the chest was performed using the standard protocol during  bolus administration of intravenous contrast. Multiplanar CT image reconstructions and MIPs were obtained to evaluate the vascular anatomy. CONTRAST:  168mL ISOVUE-370 IOPAMIDOL (ISOVUE-370) INJECTION 76% COMPARISON:  Chest CT-08/13/2017; 10/01/2010 FINDINGS: Vascular Findings: There is adequate opacification of the pulmonary arterial system with the main pulmonary artery measuring 332 Hounsfield units. There no discrete filling defects within the pulmonary arterial tree to suggest pulmonary embolism. Normal caliber the main pulmonary artery. Cardiomegaly. Coronary artery calcifications. Calcifications within the mitral valve annulus. Small amount of pericardial fluid, presumably physiologic. Scattered atherosclerotic plaque within a normal caliber thoracic aorta. No definite evidence of thoracic aortic dissection or periaortic stranding on this nongated examination. Conventional configuration of the aortic arch. The branch vessels of the aortic arch appear patent throughout their imaged course. Review of the MIP images confirms the above findings. ---------------------------------------------------------------------------------- Nonvascular Findings: Mediastinum/Lymph Nodes: No bulky mediastinal, hilar or axillary lymphadenopathy. Lungs/Pleura: Evaluation the pulmonary parenchyma is degraded secondary to combination of patient respiratory artifact as well as quantum mottle artifact due to patient body habitus. There is minimal subsegmental atelectasis, most conspicuous within the bilateral lung bases. No discrete focal airspace opacities. Re demonstrated punctate (approximately 2 mm) nodule within the subpleural aspect the right upper lobe (image 30, series 6) as well as an approximately 0.6 cm nodule within the peripheral subpleural aspect of the right middle lobe (image 64, series 6), both of which are unchanged compared to the 09/2010 examination and thus of benign etiology. No new discrete pulmonary nodules. No  pleural effusion or pneumothorax. The central pulmonary airways appear widely patent. Upper abdomen: Limited early arterial phase evaluation of the upper abdomen demonstrates atherosclerotic plaque within the abdominal aorta as well as the splenic artery. Large amount of debris is seen within the stomach. Radiopaque gallstones are seen within neck of the gallbladder. Suspected atrophy of the bilateral kidneys, incompletely evaluated. Musculoskeletal: Post left-sided mastectomy and left axillary adenectomy. No acute or aggressive osseous abnormalities regional soft tissues  appear normal. Normal appearance of the thyroid gland. IMPRESSION: 1. No acute cardiopulmonary disease. Specifically, no evidence of pulmonary embolism. 2. Cardiomegaly. 3. Coronary artery calcifications. Aortic Atherosclerosis (ICD10-I70.0). 4. Cholelithiasis. Electronically Signed   By: Sandi Mariscal M.D.   On: 08/14/2017 17:04     Microbiology: Recent Results (from the past 240 hour(s))  MRSA PCR Screening     Status: None   Collection Time: 08/05/17  6:31 AM  Result Value Ref Range Status   MRSA by PCR NEGATIVE NEGATIVE Final    Comment:        The GeneXpert MRSA Assay (FDA approved for NASAL specimens only), is one component of a comprehensive MRSA colonization surveillance program. It is not intended to diagnose MRSA infection nor to guide or monitor treatment for MRSA infections. Performed at [facility], [place] [address] [place], [place] [address]      Labs: Basic Metabolic Panel: Recent Labs  Lab 08/13/17 1147 08/14/17 0304  NA 133* 137  K 3.9 4.3  CL 95* 97*  CO2 24 26  GLUCOSE 206* 135*  BUN 26* 36*  CREATININE 5.14* 6.44*  CALCIUM 9.7 9.8   Liver Function Tests: Recent Labs  Lab 08/14/17 0304  AST 15  ALT 8*  ALKPHOS 45  BILITOT 0.8  PROT 7.1  ALBUMIN 3.2*   No results for input(s): LIPASE, AMYLASE in the last 168 hours. No results for input(s): AMMONIA in the last 168  hours. CBC: Recent Labs  Lab 08/13/17 1147 08/14/17 0304  WBC 7.5 6.9  NEUTROABS 4.9  --   HGB 11.7* 10.6*  HCT 36.1 32.9*  MCV 80.0 80.8  PLT 165 187   Cardiac Enzymes: Recent Labs  Lab 08/13/17 1147 08/13/17 1359 08/13/17 2016 08/14/17 0304 08/14/17 0854  TROPONINI 0.04* 0.05* 0.06* 0.06* 0.06*   BNP: Invalid input(s): POCBNP CBG: Recent Labs  Lab 08/13/17 1125 08/14/17 0802 08/14/17 1201 08/14/17 1702  GLUCAP 200* 187* 84 162*    Time coordinating discharge:  Greater than 30 minutes  Signed:  Orson Eva, DO [facility] Pager: [phone] 08/14/2017, 5:49 PM

## 2017-08-22 ENCOUNTER — Telehealth: Payer: Self-pay | Admitting: *Deleted

## 2017-08-22 ENCOUNTER — Other Ambulatory Visit: Payer: Self-pay | Admitting: *Deleted

## 2017-08-22 ENCOUNTER — Encounter: Payer: Self-pay | Admitting: *Deleted

## 2017-08-22 NOTE — Progress Notes (Signed)
Left arm Fistulogram with possible interventions scheduled at the request of Dr. Lowanda Foster. Patient has HD on T,T,S at [facility] [phone]) Joy faxed request due to difficult cannulation, prolonged bleeding and increased arterial pressures. I have scheduled this fistulogram for Friday 08-25-17 at 10:00am with Dr. Scot Dock. Joy will inform patient and give preop instructions.

## 2017-08-22 NOTE — Telephone Encounter (Signed)
Call to patient to change procedure time for case on 08/25/17. Patient states she can not get to hospital earlier than 7 am. Instructed to report to [facility] Admitting at 7 am on 08/25/17.

## 2017-08-25 ENCOUNTER — Ambulatory Visit (HOSPITAL_COMMUNITY)
Admission: RE | Admit: 2017-08-25 | Discharge: 2017-08-25 | Disposition: A | Discharge status: Home / Self Care | Payer: Medicare Other | Source: Ambulatory Visit | Attending: Vascular Surgery | Admitting: Vascular Surgery

## 2017-08-25 ENCOUNTER — Encounter (HOSPITAL_COMMUNITY)
Admission: RE | Disposition: A | Discharge status: Home / Self Care | Payer: Self-pay | Source: Ambulatory Visit | Attending: Vascular Surgery

## 2017-08-25 ENCOUNTER — Encounter (HOSPITAL_COMMUNITY): Payer: Self-pay | Admitting: Vascular Surgery

## 2017-08-25 DIAGNOSIS — T82858A Stenosis of vascular prosthetic devices, implants and grafts, initial encounter: Secondary | ICD-10-CM | POA: Insufficient documentation

## 2017-08-25 DIAGNOSIS — E1121 Type 2 diabetes mellitus with diabetic nephropathy: Secondary | ICD-10-CM | POA: Insufficient documentation

## 2017-08-25 DIAGNOSIS — M1990 Unspecified osteoarthritis, unspecified site: Secondary | ICD-10-CM | POA: Diagnosis not present

## 2017-08-25 DIAGNOSIS — Z992 Dependence on renal dialysis: Secondary | ICD-10-CM

## 2017-08-25 DIAGNOSIS — Z803 Family history of malignant neoplasm of breast: Secondary | ICD-10-CM | POA: Insufficient documentation

## 2017-08-25 DIAGNOSIS — E785 Hyperlipidemia, unspecified: Secondary | ICD-10-CM | POA: Insufficient documentation

## 2017-08-25 DIAGNOSIS — T82898A Other specified complication of vascular prosthetic devices, implants and grafts, initial encounter: Secondary | ICD-10-CM | POA: Diagnosis not present

## 2017-08-25 DIAGNOSIS — Z853 Personal history of malignant neoplasm of breast: Secondary | ICD-10-CM | POA: Diagnosis not present

## 2017-08-25 DIAGNOSIS — T82838A Hemorrhage of vascular prosthetic devices, implants and grafts, initial encounter: Secondary | ICD-10-CM | POA: Diagnosis present

## 2017-08-25 DIAGNOSIS — Z888 Allergy status to other drugs, medicaments and biological substances status: Secondary | ICD-10-CM | POA: Diagnosis not present

## 2017-08-25 DIAGNOSIS — N186 End stage renal disease: Secondary | ICD-10-CM | POA: Insufficient documentation

## 2017-08-25 DIAGNOSIS — Z8249 Family history of ischemic heart disease and other diseases of the circulatory system: Secondary | ICD-10-CM | POA: Insufficient documentation

## 2017-08-25 DIAGNOSIS — E1122 Type 2 diabetes mellitus with diabetic chronic kidney disease: Secondary | ICD-10-CM | POA: Insufficient documentation

## 2017-08-25 DIAGNOSIS — I509 Heart failure, unspecified: Secondary | ICD-10-CM | POA: Insufficient documentation

## 2017-08-25 DIAGNOSIS — I2510 Atherosclerotic heart disease of native coronary artery without angina pectoris: Secondary | ICD-10-CM | POA: Diagnosis not present

## 2017-08-25 DIAGNOSIS — I447 Left bundle-branch block, unspecified: Secondary | ICD-10-CM | POA: Insufficient documentation

## 2017-08-25 DIAGNOSIS — Z87891 Personal history of nicotine dependence: Secondary | ICD-10-CM | POA: Insufficient documentation

## 2017-08-25 DIAGNOSIS — I132 Hypertensive heart and chronic kidney disease with heart failure and with stage 5 chronic kidney disease, or end stage renal disease: Secondary | ICD-10-CM | POA: Insufficient documentation

## 2017-08-25 DIAGNOSIS — E079 Disorder of thyroid, unspecified: Secondary | ICD-10-CM | POA: Insufficient documentation

## 2017-08-25 DIAGNOSIS — Y832 Surgical operation with anastomosis, bypass or graft as the cause of abnormal reaction of the patient, or of later complication, without mention of misadventure at the time of the procedure: Secondary | ICD-10-CM | POA: Insufficient documentation

## 2017-08-25 HISTORY — PX: PERIPHERAL VASCULAR BALLOON ANGIOPLASTY: CATH118281

## 2017-08-25 HISTORY — PX: A/V FISTULAGRAM: CATH118298

## 2017-08-25 LAB — POCT I-STAT, CHEM 8
BUN: 21 mg/dL — AB (ref 6–20)
CALCIUM ION: 1.13 mmol/L — AB (ref 1.15–1.40)
Chloride: 96 mmol/L — ABNORMAL LOW (ref 101–111)
Creatinine, Ser: 4.6 mg/dL — ABNORMAL HIGH (ref 0.44–1.00)
GLUCOSE: 130 mg/dL — AB (ref 65–99)
HCT: 35 % — ABNORMAL LOW (ref 36.0–46.0)
Hemoglobin: 11.9 g/dL — ABNORMAL LOW (ref 12.0–15.0)
Potassium: 4.3 mmol/L (ref 3.5–5.1)
SODIUM: 138 mmol/L (ref 135–145)
TCO2: 31 mmol/L (ref 22–32)

## 2017-08-25 SURGERY — A/V FISTULAGRAM
Anesthesia: LOCAL

## 2017-08-25 MED ORDER — SODIUM CHLORIDE 0.9% FLUSH: 3.0000 mL | Freq: Two times a day (BID) | INTRAVENOUS | Status: DC

## 2017-08-25 MED ORDER — MIDAZOLAM HCL 2 MG/2ML IJ SOLN
INTRAMUSCULAR | Status: DC | PRN
  Administered 2017-08-25: 0.5 mg via INTRAVENOUS

## 2017-08-25 MED ORDER — SODIUM CHLORIDE 0.9 % IV SOLN: 250.0000 mL | INTRAVENOUS | Status: DC | PRN

## 2017-08-25 MED ORDER — SODIUM CHLORIDE 0.9% FLUSH: 3.0000 mL | INTRAVENOUS | Status: DC | PRN

## 2017-08-25 MED ORDER — MIDAZOLAM HCL 2 MG/2ML IJ SOLN
INTRAMUSCULAR | Status: AC
  Filled 2017-08-25: qty 2

## 2017-08-25 MED ORDER — HEPARIN (PORCINE) IN NACL 2-0.9 UNIT/ML-% IJ SOLN
INTRAMUSCULAR | Status: AC | PRN
  Administered 2017-08-25: 500 mL

## 2017-08-25 MED ORDER — HEPARIN SODIUM (PORCINE) 1000 UNIT/ML IJ SOLN
INTRAMUSCULAR | Status: DC | PRN
  Administered 2017-08-25: 4000 [IU] via INTRAVENOUS

## 2017-08-25 MED ORDER — FENTANYL CITRATE (PF) 100 MCG/2ML IJ SOLN
INTRAMUSCULAR | Status: AC
  Filled 2017-08-25: qty 2

## 2017-08-25 MED ORDER — HEPARIN (PORCINE) IN NACL 2-0.9 UNIT/ML-% IJ SOLN
INTRAMUSCULAR | Status: AC
  Filled 2017-08-25: qty 500

## 2017-08-25 MED ORDER — LIDOCAINE HCL (PF) 1 % IJ SOLN
INTRAMUSCULAR | Status: AC
  Filled 2017-08-25: qty 30

## 2017-08-25 MED ORDER — FENTANYL CITRATE (PF) 100 MCG/2ML IJ SOLN
INTRAMUSCULAR | Status: DC | PRN
  Administered 2017-08-25: 25 ug via INTRAVENOUS

## 2017-08-25 MED ORDER — HEPARIN SODIUM (PORCINE) 1000 UNIT/ML IJ SOLN
INTRAMUSCULAR | Status: AC
  Filled 2017-08-25: qty 1

## 2017-08-25 MED ORDER — IODIXANOL 320 MG/ML IV SOLN
INTRAVENOUS | Status: DC | PRN
  Administered 2017-08-25: 40 mL via INTRAVENOUS

## 2017-08-25 MED ORDER — LIDOCAINE HCL (PF) 1 % IJ SOLN
INTRAMUSCULAR | Status: DC | PRN
  Administered 2017-08-25: 2 mL

## 2017-08-25 SURGICAL SUPPLY — 17 items
BAG SNAP BAND KOVER 36X36 (MISCELLANEOUS) ×3 IMPLANT
BALLN MUSTANG 10.0X40 75 (BALLOONS) ×3
BALLN MUSTANG 8.0X40 75 (BALLOONS) ×3
BALLOON MUSTANG 10.0X40 75 (BALLOONS) ×2 IMPLANT
BALLOON MUSTANG 8.0X40 75 (BALLOONS) ×2 IMPLANT
COVER DOME SNAP 22 D (MISCELLANEOUS) ×3 IMPLANT
COVER PRB 48X5XTLSCP FOLD TPE (BAG) ×2 IMPLANT
COVER PROBE 5X48 (BAG) ×1
KIT ENCORE 26 ADVANTAGE (KITS) ×3 IMPLANT
KIT MICROPUNCTURE NIT STIFF (SHEATH) ×3 IMPLANT
PROTECTION STATION PRESSURIZED (MISCELLANEOUS) ×3
SHEATH PINNACLE R/O II 6F 4CM (SHEATH) ×3 IMPLANT
STATION PROTECTION PRESSURIZED (MISCELLANEOUS) ×2 IMPLANT
STOPCOCK MORSE 400PSI 3WAY (MISCELLANEOUS) ×3 IMPLANT
TRAY PV CATH (CUSTOM PROCEDURE TRAY) ×3 IMPLANT
TUBING CIL FLEX 10 FLL-RA (TUBING) ×3 IMPLANT
WIRE BENTSON .035X145CM (WIRE) ×3 IMPLANT

## 2017-08-25 NOTE — Op Note (Signed)
PATIENT: Alexis [name]      MRN: [id number] DOB: [birth year]    DATE OF PROCEDURE: 08/25/2017  INDICATIONS:    EMONI WHITWORTH is a 72 y.o. 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  Ultrasound-guided access to left brachiocephalic fistula 2.  Fistulogram left brachiocephalic fistula 3.  Venoplasty of left brachiocephalic fistula 4.  Conscious sedation  SURGEON: Judeth Cornfield. Scot Dock, MD, FACS  ANESTHESIA: Local with sedation  EBL: Minimal  TECHNIQUE: The patient was taken to the peripheral vascular lab and was sedated. The period of conscious sedation was 26 minutes.  During that time period, I was present face-to-face 100% of the time.  The patient was administered half a milligram of Versed and 25 mcg of fentanyl. The patient's heart rate, blood pressure, and oxygen saturation were monitored by the nurse continuously during the procedure.  The left arm was prepped and draped in usual sterile fashion.  Under ultrasound guidance, after the skin was anesthetized, the proximal fistula was cannulated with a micropuncture needle and a micropuncture sheath introduced over the wire.  A fistulogram was obtained which demonstrated a 90% stenosis just where the cephalic vein entered the subclavian vein.  I elected to address this with venoplasty.  The short micropuncture sheath was exchanged for a short 6 French sheath over a Bentson wire and the patient was then heparinized with 4000 units of heparin.  I selected initially an 8 mm x 4 cm balloon.  This was positioned across the stenosis and inflated to 20 atm for 1 minute.  With the balloon inflated we did do a reflux shot to evaluate the arterial anastomosis which was patent.  The balloon was deflated and then follow-up film showed some residual stenosis.  There was also one area in the central portion of the fistula that appeared that there might be a slight web here.  I inflated the balloon  here and there is really no significant placed.  I then went back with a 10 mm x 4 cm balloon which was inflated to 20 atm for 1 minute.  Completion film showed an excellent result in the thrill at this point was much improved.  The cannulation site was then closed with a 4-0 Monocryl.  Pressure was held for hemostasis.  No immediate complications were noted.  FINDINGS:   1.  Patent left brachiocephalic fistula 2.  Venoplasty of cephalic vein stenosis as described above No other significant problems identified with the fistula.3.   [name], MD, FACS [facility]  DATE OF DICTATION:   08/25/2017

## 2017-08-25 NOTE — Discharge Instructions (Signed)

## 2017-08-25 NOTE — H&P (Signed)
Patient name: Alexis [name] MRN: [id number] DOB: 1945/12/14 Sex: female   REASON FOR VISIT:    Poorly functioning left arm fistula.  The consult is requested by Dr. Lowanda Foster.  HPI:   Alexis [name] is a pleasant 72 y.o. female, who has a left upper arm AV fistula.  She dialyzes on Tuesdays Thursdays and Saturdays.  They have had problems cannulating her fistula and have had prolonged bleeding times and increased arterial pressures.  We were asked to perform a fistulogram.  She denies any recent uremic symptoms.  Specifically, she denies nausea, vomiting, fatigue, anorexia, or palpitations.  Via  Past Medical History:  Diagnosis Date  . Arthritis   . Cancer [facility]) 2005    left breast  . CHF (congestive heart failure) ([place])   . Chronic back pain   . Chronic kidney disease 03/2014   dialysis t/th/sa  . Coronary artery disease   . Diabetes mellitus    Type 2  . Diabetic nephropathy ([place]) 01-08-13  . Dyslipidemia 01-08-13  . ESRD on hemodialysis ([place])    Tu, Th, Sat  . Headache   . Hyperlipidemia   . Hypertension   . LBBB (left bundle branch block)   . Proteinuria 01-08-13  . Thyroid disease 01-08-13   Hyper-parathyroidism-secondary    Family History  Problem Relation Age of Onset  . Breast cancer Mother        Died age 55  . Cancer Mother 57       Breast  . Heart disease Mother   . Hyperlipidemia Mother   . Hypertension Mother   . Heart attack Mother     SOCIAL HISTORY: Social History   Socioeconomic History  . Marital status: Legally Separated    Spouse name: Not on file  . Number of children: 3  . Years of education: Not on file  . Highest education level: Not on file  Occupational History  . Occupation: Retired  Scientific laboratory technician  . Financial resource strain: Not on file  . Food insecurity:    Worry: Not on file    Inability: Not on file  . Transportation needs:    Medical: Not on file    Non-medical: Not on file  Tobacco Use  . Smoking status: Former  Smoker    Packs/day: 1.00    Years: 50.00    Pack years: 50.00    Types: Cigarettes    Last attempt to quit: 05/16/1998    Years since quitting: 19.2  . Smokeless tobacco: Never Used  Substance and Sexual Activity  . Alcohol use: No    Alcohol/week: 0.0 oz  . Drug use: No  . Sexual activity: Yes    Birth control/protection: Post-menopausal  Lifestyle  . Physical activity:    Days per week: Not on file    Minutes per session: Not on file  . Stress: Not on file  Relationships  . Social connections:    Talks on phone: Not on file    Gets together: Not on file    Attends religious service: Not on file    Active member of club or organization: Not on file    Attends meetings of clubs or organizations: Not on file    Relationship status: Not on file  . Intimate partner violence:    Fear of current or ex partner: Not on file    Emotionally abused: Not on file    Physically abused: Not on file    Forced sexual activity:  Not on file  Other Topics Concern  . Not on file  Social History Narrative   Lives at [place] alone (most of the time).  Three grandchildren and a one year old great grandchild    Allergies  Allergen Reactions  . Olmesartan Other (See Comments)    Hyperkalemia    Current Facility-Administered Medications  Medication Dose Route Frequency Provider Last Rate Last Dose  . 0.9 %  sodium chloride infusion  250 mL Intravenous PRN Angelia Mould, MD      . sodium chloride flush (NS) 0.9 % injection 3 mL  3 mL Intravenous Q12H Angelia Mould, MD      . sodium chloride flush (NS) 0.9 % injection 3 mL  3 mL Intravenous PRN Angelia Mould, MD        REVIEW OF SYSTEMS:  [X]  denotes positive finding, [ ]  denotes negative finding Cardiac  Comments:  Chest pain or chest pressure:    Shortness of breath upon exertion: x   Short of breath when lying flat:    Irregular heart rhythm:        Vascular    Pain in calf, thigh, or hip brought on by  ambulation:    Pain in feet at night that wakes you up from your sleep:     Blood clot in your veins:    Leg swelling:         Pulmonary    Oxygen at home:    Productive cough:     Wheezing:         Neurologic    Sudden weakness in arms or legs:     Sudden numbness in arms or legs:     Sudden onset of difficulty speaking or slurred speech:    Temporary loss of vision in one eye:     Problems with dizziness:         Gastrointestinal    Blood in stool:     Vomited blood:         Genitourinary    Burning when urinating:     Blood in urine:        Psychiatric    Major depression:         Hematologic    Bleeding problems:    Problems with blood clotting too easily:        Skin    Rashes or ulcers:        Constitutional    Fever or chills:     PHYSICAL EXAM:   Vitals:   08/25/17 0705  BP: (!) 119/49  Pulse: 72  Temp: 98.5 F (36.9 C)  TempSrc: Oral  SpO2: 93%  Weight: 241 lb (109.3 kg)  Height: 5\' 6"  (1.676 m)    GENERAL: The patient is a well-nourished female, in no acute distress. The vital signs are documented above. CARDIAC: There is a regular rate and rhythm.  VASCULAR: She has a palpable left radial pulse. Her left upper arm fistula is pulsatile. PULMONARY: There is good air exchange bilaterally without wheezing or rales. ABDOMEN: Soft and non-tender with normal pitched bowel sounds.  MUSCULOSKELETAL: There are no major deformities or cyanosis. NEUROLOGIC: No focal weakness or paresthesias are detected. SKIN: There are no ulcers or rashes noted. PSYCHIATRIC: The patient has a normal affect.  DATA:    Potassium is 4.3.  Hemoglobin is 11.9.  MEDICAL ISSUES:   POORLY FUNCTIONING LEFT UPPER ARM FISTULA: I have recommended that we proceed with a fistulogram.  I  explained that if we find something amenable to venoplasty we would proceed at the same time.  I have discussed the procedure and potential complications with the patient and she is agreeable to  proceed  [name] [facility] [employer] [phone]

## 2017-08-27 ENCOUNTER — Encounter (HOSPITAL_COMMUNITY): Payer: Self-pay | Admitting: Emergency Medicine

## 2017-08-27 ENCOUNTER — Emergency Department (HOSPITAL_COMMUNITY)
Admission: EM | Admit: 2017-08-27 | Discharge: 2017-08-27 | Disposition: A | Discharge status: Home / Self Care | Payer: Medicare Other | Attending: Emergency Medicine | Admitting: Emergency Medicine

## 2017-08-27 DIAGNOSIS — N186 End stage renal disease: Secondary | ICD-10-CM | POA: Diagnosis not present

## 2017-08-27 DIAGNOSIS — Z7982 Long term (current) use of aspirin: Secondary | ICD-10-CM | POA: Diagnosis not present

## 2017-08-27 DIAGNOSIS — E1122 Type 2 diabetes mellitus with diabetic chronic kidney disease: Secondary | ICD-10-CM | POA: Insufficient documentation

## 2017-08-27 DIAGNOSIS — I509 Heart failure, unspecified: Secondary | ICD-10-CM | POA: Insufficient documentation

## 2017-08-27 DIAGNOSIS — Z992 Dependence on renal dialysis: Secondary | ICD-10-CM | POA: Diagnosis not present

## 2017-08-27 DIAGNOSIS — Z9012 Acquired absence of left breast and nipple: Secondary | ICD-10-CM | POA: Insufficient documentation

## 2017-08-27 DIAGNOSIS — I132 Hypertensive heart and chronic kidney disease with heart failure and with stage 5 chronic kidney disease, or end stage renal disease: Secondary | ICD-10-CM | POA: Insufficient documentation

## 2017-08-27 DIAGNOSIS — G8918 Other acute postprocedural pain: Secondary | ICD-10-CM | POA: Diagnosis not present

## 2017-08-27 DIAGNOSIS — Z87891 Personal history of nicotine dependence: Secondary | ICD-10-CM | POA: Insufficient documentation

## 2017-08-27 DIAGNOSIS — Z79899 Other long term (current) drug therapy: Secondary | ICD-10-CM | POA: Insufficient documentation

## 2017-08-27 DIAGNOSIS — Z955 Presence of coronary angioplasty implant and graft: Secondary | ICD-10-CM | POA: Insufficient documentation

## 2017-08-27 DIAGNOSIS — Z794 Long term (current) use of insulin: Secondary | ICD-10-CM | POA: Insufficient documentation

## 2017-08-27 DIAGNOSIS — Z853 Personal history of malignant neoplasm of breast: Secondary | ICD-10-CM | POA: Diagnosis not present

## 2017-08-27 MED ORDER — OXYCODONE-ACETAMINOPHEN 5-325 MG PO TABS
1.0000 | ORAL_TABLET | Freq: Once | ORAL | Status: AC
  Administered 2017-08-27: 1 via ORAL
  Filled 2017-08-27: qty 1

## 2017-08-27 MED ORDER — HYDROCODONE-ACETAMINOPHEN 5-325 MG PO TABS: 2.0000 | ORAL_TABLET | Freq: Four times a day (QID) | ORAL | 0 refills | Status: DC | PRN

## 2017-08-27 MED ORDER — ACETAMINOPHEN 325 MG PO TABS
650.0000 mg | ORAL_TABLET | Freq: Once | ORAL | Status: AC
  Administered 2017-08-27: 650 mg via ORAL
  Filled 2017-08-27: qty 2

## 2017-08-27 NOTE — Discharge Instructions (Signed)
As discussed, take your pain medication as needed and call Dr. Scot Dock in the morning for follow up. Return if symptoms worsen or new concerning symptoms in the meanitme.

## 2017-08-27 NOTE — ED Provider Notes (Signed)
[name] EMERGENCY DEPARTMENT Provider Note   CSN: [id number] Arrival date & time: 08/27/17  1044     History   Chief Complaint Chief Complaint  Patient presents with  . Arm Pain  . Post-op Problem    HPI Alexis [name] is a 72 y.o. female with past medical history significant for arthritis, CHF, end-stage renal disease on dialysis, CAD, diabetes, hyperlipidemia, hypertension, presenting with pain and swelling at her fistula site on the left upper extremity.  Reports that she had a fistula put in on Friday.  He was told to return if any pain or swelling in the reports that last night it started to hurt and swell focally.  She denies any other symptoms, she is otherwise feeling well and reports significant improvement after Percocet on arrival.  She denies any chest pain, shortness of breath, dizziness, loss of balance, weakness, she denies any decrease in her arm mobility, no fever, chills, redness.  HPI  Past Medical History:  Diagnosis Date  . Arthritis   . Cancer [facility]) 2005    left breast  . CHF (congestive heart failure) ([place])   . Chronic back pain   . Chronic kidney disease 03/2014   dialysis t/th/sa  . Coronary artery disease   . Diabetes mellitus    Type 2  . Diabetic nephropathy ([place]) 01-08-13  . Dyslipidemia 01-08-13  . ESRD on hemodialysis ([place])    Tu, Th, Sat  . Headache   . Hyperlipidemia   . Hypertension   . LBBB (left bundle branch block)   . Proteinuria 01-08-13  . Thyroid disease 01-08-13   Hyper-parathyroidism-secondary    Patient Active Problem List   Diagnosis Date Noted  . Elevated troponin   . ESRD on hemodialysis ([place])   . ESRD (end stage renal disease) ([place]) 08/13/2017  . Dyspnea 08/13/2017  . Chest pain 08/04/2017  . 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 Essential hypertension 08/15/2012  . CAD (coronary artery disease) 03/25/2011  . Type II diabetes mellitus with manifestations  ([place]) 03/25/2011  . Chest pain 03/25/2011  . CHF (congestive heart failure) ([place]) 10/14/2010  . CKD (chronic kidney disease) 10/14/2010  . Obesity 10/14/2010    Past Surgical History:  Procedure Laterality Date  . A/V FISTULAGRAM N/A 08/25/2017   Procedure: A/V FISTULAGRAM - Left Arm;  Surgeon: Angelia Mould, MD;  Location: [place] CV LAB;  Service: Cardiovascular;  Laterality: N/A;  . ABDOMINAL AORTAGRAM N/A 08/11/2014   Procedure: ABDOMINAL Maxcine Ham;  Surgeon: Angelia Mould, MD;  Location[facility] CATH LAB;  Service: Cardiovascular;  Laterality: N/A;  . ABDOMINAL HYSTERECTOMY    . APPENDECTOMY    . Arm surgery     Left arm trauma  . AV FISTULA PLACEMENT Left 08/21/2014   Procedure: LEFT ARM ARTERIOVENOUS (AV) FISTULA CREATION ;  Surgeon: Angelia Mould, MD;  Location: [place];  Service: Vascular;  Laterality: Left;  . BACK SURGERY    . CATARACT EXTRACTION W/PHACO Left 12/22/2014   Procedure: CATARACT EXTRACTION PHACO AND INTRAOCULAR LENS PLACEMENT (IOC);  Surgeon: Tonny Branch, MD;  Location: [facility] ORS;  Service: Ophthalmology;  Laterality: Left;  CDE 13.48  . CORONARY ANGIOPLASTY  12/19/2003   inferior wall hypokinesis. ef 50%  . dialysis catheter    . MASTECTOMY     Left  . PERIPHERAL VASCULAR BALLOON ANGIOPLASTY Left 08/25/2017   Procedure: PERIPHERAL VASCULAR BALLOON ANGIOPLASTY;  Surgeon: Angelia Mould, MD;  Location: [place]  CV LAB;  Service: Cardiovascular;  Laterality: Left;  arm fistula  . PERIPHERAL VASCULAR CATHETERIZATION N/A 09/16/2015   Procedure: Fistulagram;  Surgeon: Rosetta Posner, MD;  Location: [place] CV LAB;  Service: Cardiovascular;  Laterality: N/A;  . SPINE SURGERY    . TONSILLECTOMY    . TRANSTHORACIC ECHOCARDIOGRAM  10/01/2010    Left ventricle: The cavity size was mildly dilated. Wall thickness was increased in a pattern of mild LVH. Systolic function was   mildly reduced. The estimated ejection fraction was in the range  of 45% to  50%.   . TUBAL LIGATION       OB History   None      Home Medications    Prior to Admission medications   Medication Sig Start Date End Date Taking? Authorizing Provider  aspirin EC 81 MG EC tablet Take 1 tablet (81 mg total) by mouth daily. 08/05/17  Yes Osei-Bonsu, Iona Beard, MD  AURYXIA 1 GM 210 MG(Fe) tablet Take 420 mg by mouth 3 (three) times daily with meals. 07/25/17  Yes [provider]  carvedilol (COREG) 25 MG tablet Take 25 mg by mouth 2 (two) times daily with a meal.   Yes [provider]  diclofenac sodium (VOLTAREN) 1 % GEL Apply 1 application topically daily as needed. Right arm   Yes [provider]  docusate sodium (COLACE) 100 MG capsule Take 100 mg by mouth daily as needed for mild constipation.  03/25/14  Yes [provider]  hydrALAZINE (APRESOLINE) 100 MG tablet Take 100 mg by mouth 3 (three) times daily.   Yes [provider]  hydrOXYzine (ATARAX/VISTARIL) 25 MG tablet Take 25 mg by mouth 2 (two) times daily as needed for itching.   Yes [provider]  insulin aspart protamine-insulin aspart (NOVOLOG 70/30) (70-30) 100 UNIT/ML injection Inject 20-25 Units into the skin 2 (two) times daily with a meal. 20 UNITS IN THE MORNING AND 25 UNITS AT BEDTIME   Yes [provider]  isosorbide mononitrate (IMDUR) 30 MG 24 hr tablet Take 30 mg by mouth daily.  08/03/15  Yes [provider]  nitroGLYCERIN (NITROSTAT) 0.4 MG SL tablet Place 0.4 mg under the tongue every 5 (five) minutes as needed for chest pain.   Yes [provider]  sevelamer carbonate (RENVELA) 800 MG tablet Take 800 mg by mouth 3 (three) times daily with meals.  03/25/14  Yes [provider]  tiZANidine (ZANAFLEX) 4 MG tablet Take 4 mg by mouth at bedtime as needed for muscle spasms. 08/22/17  Yes [provider]  traMADol (ULTRAM) 50 MG tablet Take 1 tablet by mouth daily as needed. 08/22/17  Yes [provider]    HYDROcodone-acetaminophen (NORCO/VICODIN) 5-325 MG tablet Take 2 tablets by mouth every 6 (six) hours as needed for moderate pain or severe pain. 08/27/17   Emeline General, PA-C    Family History Family History  Problem Relation Age of Onset  . Breast cancer Mother        Died age 53  . Cancer Mother 40       Breast  . Heart disease Mother   . Hyperlipidemia Mother   . Hypertension Mother   . Heart attack Mother     Social History Social History   Tobacco Use  . Smoking status: Former Smoker    Packs/day: 1.00    Years: 50.00    Pack years: 50.00    Types: Cigarettes    Last attempt to  quit: 05/16/1998    Years since quitting: 19.2  . Smokeless tobacco: Never Used  Substance Use Topics  . Alcohol use: No    Alcohol/week: 0.0 oz  . Drug use: No     Allergies   Olmesartan   Review of Systems Review of Systems  Constitutional: Negative for chills, diaphoresis, fatigue and fever.  HENT: Negative for congestion.   Eyes: Negative for visual disturbance.  Respiratory: Negative for cough, choking, chest tightness, shortness of breath, wheezing and stridor.   Cardiovascular: Negative for chest pain, palpitations and leg swelling.  Gastrointestinal: Negative for abdominal distention, abdominal pain, nausea and vomiting.  Musculoskeletal: Positive for myalgias. Negative for gait problem, neck pain and neck stiffness.  Skin: Negative for color change, pallor, rash and wound.       Focal pain over the fistula site and edema  Neurological: Negative for dizziness, tremors, syncope, facial asymmetry, speech difficulty, weakness, light-headedness, numbness and headaches.  Psychiatric/Behavioral: Negative for behavioral problems.     Physical Exam Updated Vital Signs BP 135/62   Pulse 81   Temp 99.3 F (37.4 C) (Oral)   Resp 17   SpO2 96%   Physical Exam  Constitutional: She appears well-developed and well-nourished. No distress.  Afebrile, nontoxic-appearing,  lying comfortably in bed no acute distress.  HENT:  Head: Normocephalic and atraumatic.  Eyes: Conjunctivae and EOM are normal.  Neck: Normal range of motion.  Cardiovascular: Normal rate, regular rhythm, normal heart sounds and intact distal pulses.  Strong radial pulse  Pulmonary/Chest: Effort normal and breath sounds normal. No stridor. No respiratory distress. She has no wheezes. She has no rales.  Musculoskeletal: Normal range of motion. She exhibits no edema.  Patient has full range of motion at the elbow and can lift her shoulder to 90 degree which is baseline for her.  No reduction in mobility of the left upper extremity.  Neurological: She is alert. No sensory deficit. She exhibits normal muscle tone.  Skin: Skin is warm and dry. No rash noted. She is not diaphoretic. No erythema. No pallor.  Well-healed incision at fistula site, mild ecchymosis, 2cm raised area overlying fistula. No circumferential edema, erythema. Mild ttp. Strong thrill and bruit over the fistula.  Psychiatric: She has a normal mood and affect.  Nursing note and vitals reviewed.    ED Treatments / Results  Labs (all labs ordered are listed, but only abnormal results are displayed) Labs Reviewed - No data to display  EKG None  Radiology No results found.  Procedures Procedures (including critical care time)  Medications Ordered in ED Medications  oxyCODONE-acetaminophen (PERCOCET/ROXICET) 5-325 MG per tablet 1 tablet (1 tablet Oral Given 08/27/17 1120)  acetaminophen (TYLENOL) tablet 650 mg (650 mg Oral Given 08/27/17 1452)     Initial Impression / Assessment and Plan / ED Course  I have reviewed the triage vital signs and the nursing notes.  Pertinent labs & imaging results that were available during my care of the patient were reviewed by me and considered in my medical decision making (see chart for details).    Patient presents with raised area over recent fistula procedure 2 days ago and  pain at the site since last night. Reassuring exam, strong bruit and thrill, no reduced mobility, strong distal pulse.  Single suture at entry site without surrounding erythema or purulence Patient improved after analgesia, she denies any other symptoms.  Reviewed procedure note for fistulagram and discussed with Dr Eulis Foster. Will consult vascular for possible aneurysm formation  given new mass and pain at the site of entry.  Unable to reach vascular surgery for over 2 hours. Discussed with Dr. Eulis Foster and plan to discharge home with close follow up with Dr. Scot Dock Vascular first thing in the morning. She is well-appearing afebrile and non-toxic.  Discussed strict return precautions and advised to return to the emergency department if experiencing any new or worsening symptoms. Instructions were understood and patient agreed with discharge plan.  Final Clinical Impressions(s) / ED Diagnoses   Final diagnoses:  Post-operative pain    ED Discharge Orders        Ordered    HYDROcodone-acetaminophen (NORCO/VICODIN) 5-325 MG tablet  Every 6 hours PRN     08/27/17 1559       Emeline General, PA-C 08/27/17 1618    Daleen Bo, MD 08/28/17 1256

## 2017-08-27 NOTE — ED Notes (Signed)
ED Provider at bedside. 

## 2017-08-27 NOTE — ED Notes (Signed)
Pt sitting in chair.  Pt asked to stay in the bed. Pt states chair is more comfortable.  Pt has fall risk socks on.  Family at the bedside helped Pt into chair.

## 2017-08-27 NOTE — ED Triage Notes (Signed)
Patient presents to ED for assessment after having her fistula in her left upper arm opened up from a clot/poor flow on Friday.  States her arm has been hurting since, and she notes some swelling, no pitting in triage.  Thrill and bruit present.

## 2017-08-27 NOTE — ED Provider Notes (Signed)
  Face-to-face evaluation    History: She complains of pain in her left arm, at the site of the fistula which she uses for dialysis, which is status post fistulogram and balloon dilatation procedure, 2 days ago.  She denied pain immediately after the procedure but developed pain at the site yesterday evening.  Physical exam: Left upper arm fistula site, anterior, lower, with 2 x 3 cm.  pulsatile mass, slightly tender, with overlying wound and suture closure.  Fistula palpated above has normal pulse.    15: 35-after multiple attempts to page vascular surgery I was contacted by nurse who states that the doctor (Cain)is scrubbed in and could not talk at this time.  The nurse did not know when the doctor would be available.  Medical screening examination/treatment/procedure(s) were conducted as a shared visit with non-physician practitioner(s) and myself.  I personally evaluated the patient during the encounter    Daleen Bo, MD 08/28/17 1255

## 2017-08-27 NOTE — ED Notes (Signed)
Per MD Pt is able to eat and drink. Pt given food.

## 2017-08-28 ENCOUNTER — Encounter: Payer: Self-pay | Admitting: Surgery

## 2017-08-28 ENCOUNTER — Ambulatory Visit (INDEPENDENT_AMBULATORY_CARE_PROVIDER_SITE_OTHER): Payer: Medicare Other | Admitting: Surgery

## 2017-08-28 ENCOUNTER — Other Ambulatory Visit: Payer: Self-pay

## 2017-08-28 VITALS — BP 153/74 | HR 81 | Temp 98.9°F | Resp 20 | Ht 66.0 in | Wt 240.0 lb

## 2017-08-28 DIAGNOSIS — Z992 Dependence on renal dialysis: Secondary | ICD-10-CM | POA: Diagnosis not present

## 2017-08-28 DIAGNOSIS — N186 End stage renal disease: Secondary | ICD-10-CM

## 2017-08-28 NOTE — Progress Notes (Signed)
Patient name: Alexis [name] MRN: [id number] DOB: 1945/08/04 Sex: female  REASON FOR VISIT:     follow up  HISTORY OF PRESENT ILLNESS:   Alexis [name] is a 72 y.o. female who is status post left arm fistulogram 2 days ago.  She had a 90% stenosis at the junction between the cephalic vein and the subclavian vein.  This was successfully treated with angioplasty using an 8 mm balloon followed by a 10 mm balloon.  The patient went to the emergency department yesterday with complaints of pain in her arm as well as some swelling.  She was sent to our office for follow-up.  CURRENT MEDICATIONS:    Current Outpatient Medications  Medication Sig Dispense Refill  . aspirin EC 81 MG EC tablet Take 1 tablet (81 mg total) by mouth daily. 30 tablet 0  . AURYXIA 1 GM 210 MG(Fe) tablet Take 420 mg by mouth 3 (three) times daily with meals.  0  . carvedilol (COREG) 25 MG tablet Take 25 mg by mouth 2 (two) times daily with a meal.    . diclofenac sodium (VOLTAREN) 1 % GEL Apply 1 application topically daily as needed. Right arm    . docusate sodium (COLACE) 100 MG capsule Take 100 mg by mouth daily as needed for mild constipation.     . hydrALAZINE (APRESOLINE) 100 MG tablet Take 100 mg by mouth 3 (three) times daily.    Marland Kitchen HYDROcodone-acetaminophen (NORCO/VICODIN) 5-325 MG tablet Take 2 tablets by mouth every 6 (six) hours as needed for moderate pain or severe pain. 6 tablet 0  . hydrOXYzine (ATARAX/VISTARIL) 25 MG tablet Take 25 mg by mouth 2 (two) times daily as needed for itching.    . insulin aspart protamine-insulin aspart (NOVOLOG 70/30) (70-30) 100 UNIT/ML injection Inject 20-25 Units into the skin 2 (two) times daily with a meal. 20 UNITS IN THE MORNING AND 25 UNITS AT BEDTIME    . isosorbide mononitrate (IMDUR) 30 MG 24 hr tablet Take 30 mg by mouth daily.     . nitroGLYCERIN (NITROSTAT) 0.4 MG SL tablet Place 0.4 mg under the tongue every 5 (five) minutes as  needed for chest pain.    . sevelamer carbonate (RENVELA) 800 MG tablet Take 800 mg by mouth 3 (three) times daily with meals.     Marland Kitchen tiZANidine (ZANAFLEX) 4 MG tablet Take 4 mg by mouth at bedtime as needed for muscle spasms.  2  . traMADol (ULTRAM) 50 MG tablet Take 1 tablet by mouth daily as needed.  2   Current Facility-Administered Medications  Medication Dose Route Frequency Provider Last Rate Last Dose  . lidocaine (PF) (XYLOCAINE) 1 % injection 0.3 mL  0.3 mL Other Once Magnus Sinning, MD        REVIEW OF SYSTEMS:   [X]  denotes positive finding, [ ]  denotes negative finding Cardiac  Comments:  Chest pain or chest pressure:    Shortness of breath upon exertion:    Short of breath when lying flat:    Irregular heart rhythm:    Constitutional    Fever or chills:      PHYSICAL EXAM:   Vitals:   08/28/17 0927  BP: (!) 153/74  Pulse: 81  Resp: 20  Temp: 98.9 F (37.2 C)  TempSrc: Oral  SpO2: 95%  Weight: 240 lb (108.9 kg)  Height: 5\' 6"  (1.676 m)    GENERAL: The patient is a well-nourished female, in no acute distress. The vital signs are  documented above. CARDIOVASCULAR: There is a regular rate and rhythm. PULMONARY: Non-labored respirations Excellent thrill within fistula.  Small amount of ecchymosis around the cannulation site  STUDIES:   None   MEDICAL ISSUES:   I suspect the patient's pain in her upper arm is secondary to her venoplasty.  There is a small amount of ecchymosis around her cannulation site which should resolve.  I see no complicating features from her procedure.  All of her symptoms should resolve in a short amount of time.  [name], MD [facility] [phone] Pager [phone]

## 2018-01-03 ENCOUNTER — Emergency Department (HOSPITAL_COMMUNITY)
Admission: EM | Admit: 2018-01-03 | Discharge: 2018-01-03 | Disposition: A | Discharge status: Home / Self Care | Payer: Medicare Other | Attending: Emergency Medicine | Admitting: Emergency Medicine

## 2018-01-03 ENCOUNTER — Encounter (HOSPITAL_COMMUNITY): Payer: Self-pay | Admitting: Emergency Medicine

## 2018-01-03 DIAGNOSIS — E1122 Type 2 diabetes mellitus with diabetic chronic kidney disease: Secondary | ICD-10-CM | POA: Diagnosis not present

## 2018-01-03 DIAGNOSIS — Z992 Dependence on renal dialysis: Secondary | ICD-10-CM | POA: Insufficient documentation

## 2018-01-03 DIAGNOSIS — N186 End stage renal disease: Secondary | ICD-10-CM | POA: Insufficient documentation

## 2018-01-03 DIAGNOSIS — Z853 Personal history of malignant neoplasm of breast: Secondary | ICD-10-CM | POA: Diagnosis not present

## 2018-01-03 DIAGNOSIS — I2510 Atherosclerotic heart disease of native coronary artery without angina pectoris: Secondary | ICD-10-CM | POA: Insufficient documentation

## 2018-01-03 DIAGNOSIS — M79601 Pain in right arm: Secondary | ICD-10-CM | POA: Insufficient documentation

## 2018-01-03 DIAGNOSIS — I509 Heart failure, unspecified: Secondary | ICD-10-CM | POA: Insufficient documentation

## 2018-01-03 DIAGNOSIS — Z87891 Personal history of nicotine dependence: Secondary | ICD-10-CM | POA: Insufficient documentation

## 2018-01-03 DIAGNOSIS — E1140 Type 2 diabetes mellitus with diabetic neuropathy, unspecified: Secondary | ICD-10-CM | POA: Insufficient documentation

## 2018-01-03 DIAGNOSIS — I132 Hypertensive heart and chronic kidney disease with heart failure and with stage 5 chronic kidney disease, or end stage renal disease: Secondary | ICD-10-CM | POA: Insufficient documentation

## 2018-01-03 DIAGNOSIS — M79602 Pain in left arm: Secondary | ICD-10-CM | POA: Diagnosis not present

## 2018-01-03 MED ORDER — TRAMADOL HCL 50 MG PO TABS: 50.0000 mg | ORAL_TABLET | Freq: Four times a day (QID) | ORAL | 0 refills | Status: AC | PRN

## 2018-01-03 MED ORDER — TRAMADOL HCL 50 MG PO TABS
50.0000 mg | ORAL_TABLET | Freq: Once | ORAL | Status: DC
  Filled 2018-01-03: qty 1

## 2018-01-03 MED ORDER — HYDROCODONE-ACETAMINOPHEN 5-325 MG PO TABS
2.0000 | ORAL_TABLET | Freq: Once | ORAL | Status: AC
  Administered 2018-01-03: 2 via ORAL
  Filled 2018-01-03: qty 2

## 2018-01-03 NOTE — ED Triage Notes (Signed)
Pt c.o arm back pains that got worse around 1am today. Reports that her doctor wont right her any more pain meds.

## 2018-01-03 NOTE — ED Notes (Signed)
ED Provider at bedside. 

## 2018-01-03 NOTE — ED Provider Notes (Signed)
Emergency Department Provider Note   I have reviewed the triage vital signs and the nursing notes.   HISTORY  Chief Complaint Abdominal Pain   HPI Alexis [name] is a 72 y.o. female with PMH of ESRD on HD, CHF, CAD, HLD, HTN, and chronic arm pain's to the emergency department with pain in both shoulders radiating down the arms bilaterally.  The patient describes some abdominal discomfort yesterday but states this resolved after taking medication for constipation.  She is not having abdominal pain at this time.  She states that her primary care physician will no longer refill her tramadol for pain.  She states they discussed pain management but she was not referred.  She last had dialysis yesterday and it was a normal run.  Family is frustrated because her arm pain has worsened over the last 24 hours.  Patient denies any injury.  No fevers or chills.  No numbness or tingling.  No chest pain or dyspnea. Patient states that this feels like her chronic shoulder/arm pain and is not new or different.    Past Medical History:  Diagnosis Date  . Arthritis   . Cancer [facility]) 2005    left breast  . CHF (congestive heart failure) ([place])   . Chronic back pain   . Chronic kidney disease 03/2014   dialysis t/th/sa  . Coronary artery disease   . Diabetes mellitus    Type 2  . Diabetic nephropathy ([place]) 01-08-13  . Dyslipidemia 01-08-13  . ESRD on hemodialysis ([place])    Tu, Th, Sat  . Headache   . Hyperlipidemia   . Hypertension   . LBBB (left bundle branch block)   . Proteinuria 01-08-13  . Thyroid disease 01-08-13   Hyper-parathyroidism-secondary    Patient Active Problem List   Diagnosis Date Noted  . Elevated troponin   . ESRD on hemodialysis ([place])   . ESRD (end stage renal disease) ([place]) 08/13/2017  . Dyspnea 08/13/2017  . Chest pain 08/04/2017  . 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 Essential hypertension 08/15/2012  . CAD (coronary artery  disease) 03/25/2011  . Type II diabetes mellitus with manifestations ([place]) 03/25/2011  . Chest pain 03/25/2011  . CHF (congestive heart failure) ([place]) 10/14/2010  . CKD (chronic kidney disease) 10/14/2010  . Obesity 10/14/2010    Past Surgical History:  Procedure Laterality Date  . A/V FISTULAGRAM N/A 08/25/2017   Procedure: A/V FISTULAGRAM - Left Arm;  Surgeon: Angelia Mould, MD;  Location: [place] CV LAB;  Service: Cardiovascular;  Laterality: N/A;  . ABDOMINAL AORTAGRAM N/A 08/11/2014   Procedure: ABDOMINAL Maxcine Ham;  Surgeon: Angelia Mould, MD;  [facility] CATH LAB;  Service: Cardiovascular;  Laterality: N/A;  . ABDOMINAL HYSTERECTOMY    . APPENDECTOMY    . Arm surgery     Left arm trauma  . AV FISTULA PLACEMENT Left 08/21/2014   Procedure: LEFT ARM ARTERIOVENOUS (AV) FISTULA CREATION ;  Surgeon: Angelia Mould, MD;  Location: [place];  Service: Vascular;  Laterality: Left;  . BACK SURGERY    . CATARACT EXTRACTION W/PHACO Left 12/22/2014   Procedure: CATARACT EXTRACTION PHACO AND INTRAOCULAR LENS PLACEMENT (IOC);  Surgeon: Tonny Branch, MD;  Location: [facility];  Service: Ophthalmology;  Laterality: Left;  CDE 13.48  . CORONARY ANGIOPLASTY  12/19/2003   inferior wall hypokinesis. ef 50%  . dialysis catheter    . MASTECTOMY     Left  . PERIPHERAL VASCULAR BALLOON ANGIOPLASTY  Left 08/25/2017   Procedure: PERIPHERAL VASCULAR BALLOON ANGIOPLASTY;  Surgeon: Angelia Mould, MD;  Location: [place] CV LAB;  Service: Cardiovascular;  Laterality: Left;  arm fistula  . PERIPHERAL VASCULAR CATHETERIZATION N/A 09/16/2015   Procedure: Fistulagram;  Surgeon: Rosetta Posner, MD;  Location: [place] CV LAB;  Service: Cardiovascular;  Laterality: N/A;  . SPINE SURGERY    . TONSILLECTOMY    . TRANSTHORACIC ECHOCARDIOGRAM  10/01/2010    Left ventricle: The cavity size was mildly dilated. Wall thickness was increased in a pattern of mild LVH. Systolic function was   mildly  reduced. The estimated ejection fraction was in the range  of 45% to 50%.   . TUBAL LIGATION     Allergies Olmesartan  Family History  Problem Relation Age of Onset  . Breast cancer Mother        Died age 73  . Cancer Mother 66       Breast  . Heart disease Mother   . Hyperlipidemia Mother   . Hypertension Mother   . Heart attack Mother     Social History Social History   Tobacco Use  . Smoking status: Former Smoker    Packs/day: 1.00    Years: 50.00    Pack years: 50.00    Types: Cigarettes    Last attempt to quit: 05/16/1998    Years since quitting: 19.6  . Smokeless tobacco: Never Used  Substance Use Topics  . Alcohol use: No    Alcohol/week: 0.0 standard drinks  . Drug use: No    Review of Systems  Constitutional: No fever/chills Eyes: No visual changes. ENT: No sore throat. Cardiovascular: Denies chest pain. Respiratory: Denies shortness of breath. Gastrointestinal: No abdominal pain.  No nausea, no vomiting.  No diarrhea.  No constipation. Genitourinary: Negative for dysuria. Musculoskeletal: Negative for back pain. Positive bilateral arm/shoulder pain.  Skin: Negative for rash. Neurological: Negative for headaches, focal weakness or numbness.  10-point ROS otherwise negative.  ____________________________________________   PHYSICAL EXAM:  VITAL SIGNS: ED Triage Vitals  Enc Vitals Group     BP 01/03/18 0922 (!) 150/72     Pulse Rate 01/03/18 0922 92     Resp 01/03/18 0922 20     Temp 01/03/18 0922 98.6 F (37 C)     Temp Source 01/03/18 0922 Oral     SpO2 01/03/18 0922 99 %     Weight 01/03/18 0922 244 lb (110.7 kg)     Height 01/03/18 0922 5\' 6"  (1.676 m)     Pain Score 01/03/18 0918 8   Constitutional: Alert and oriented. Well appearing and in no acute distress. Eyes: Conjunctivae are normal.  Head: Atraumatic. Nose: No congestion/rhinnorhea. Mouth/Throat: Mucous membranes are moist.  Neck: No stridor.  Cardiovascular: Normal rate,  regular rhythm. Good peripheral circulation. Grossly normal heart sounds.   Respiratory: Normal respiratory effort.  No retractions. Lungs CTAB. Gastrointestinal: Soft and nontender. No distention.  Musculoskeletal: No lower extremity tenderness nor edema. No gross deformities of extremities. Pain with passive ROM of the bilateral shoulders. Normal ROM of the elbows and wrists. No tenderness to palpation.  Neurologic:  Normal speech and language. No gross focal neurologic deficits are appreciated.  Skin:  Skin is warm, dry and intact. No rash noted.   ____________________________________________  RADIOLOGY  None ____________________________________________   PROCEDURES  Procedure(s) performed:   Procedures  None ____________________________________________   INITIAL IMPRESSION / ASSESSMENT AND PLAN / ED COURSE  Pertinent labs & imaging results  that were available during my care of the patient were reviewed by me and considered in my medical decision making (see chart for details).  Patient presents to the emergency department for evaluation of chronic arm pain.  She states that her PCP will no longer prescribe her tramadol.  Patient has pain in the arms which is chronic.  Patient states this feels the same but without her medication she is having more discomfort.  I believe the patient would benefit from pain management.  She was dialyzed yesterday.  I do not feel that imaging or lab work is indicated at this time.  Told her that I could prescribe her several days of tramadol but that future prescriptions will need to come from her primary care physician or pain management clinic. Patient verbalizes understanding.  I did check the Norco on a drug database.  She last had tramadol filled on 7/25.  I will provide a short course of this medication and placed ambulatory referral to pain clinic.   At this time, I do not feel there is any life-threatening condition present. I have reviewed  and discussed all results (EKG, imaging, lab, urine as appropriate), exam findings with patient. I have reviewed nursing notes and appropriate previous records.  I feel the patient is safe to be discharged home without further emergent workup. Discussed usual and customary return precautions. Patient and family (if present) verbalize understanding and are comfortable with this plan.  Patient will follow-up with their primary care provider. If they do not have a primary care provider, information for follow-up has been provided to them. All questions have been answered.    ____________________________________________  FINAL CLINICAL IMPRESSION(S) / ED DIAGNOSES  Final diagnoses:  Pain in both upper extremities     MEDICATIONS GIVEN DURING THIS VISIT:  Medications  traMADol (ULTRAM) tablet 50 mg (has no administration in time range)     NEW OUTPATIENT MEDICATIONS STARTED DURING THIS VISIT:  New Prescriptions   TRAMADOL (ULTRAM) 50 MG TABLET    Take 1 tablet (50 mg total) by mouth every 6 (six) hours as needed for up to 5 days.    Note:  This document was prepared using Dragon voice recognition software and may include unintentional dictation errors.  Nanda Quinton, MD Emergency Medicine    Messiah Rovira, [facility], MD 01/03/18 [id number]

## 2018-01-03 NOTE — Discharge Instructions (Signed)
You were seen in the ED today with chronic arm pain. I am starting you on Tramadol again but any future prescriptions for pain medication will need to come from your PCP or pain management team. Call today to arrange an appointment.

## 2018-01-03 NOTE — ED Notes (Signed)
Attempted to give patient Tramadol pill and patient refused. Provider aware, and patient and patient's family member stated "Well we will just go to [facility] because she needs narcotics, not Tramadol."

## 2018-01-18 DIAGNOSIS — I361 Nonrheumatic tricuspid (valve) insufficiency: Secondary | ICD-10-CM | POA: Insufficient documentation

## 2018-01-18 DIAGNOSIS — I340 Nonrheumatic mitral (valve) insufficiency: Secondary | ICD-10-CM | POA: Insufficient documentation

## 2018-01-18 DIAGNOSIS — M8949 Other hypertrophic osteoarthropathy, multiple sites: Secondary | ICD-10-CM | POA: Insufficient documentation

## 2018-01-18 DIAGNOSIS — E212 Other hyperparathyroidism: Secondary | ICD-10-CM | POA: Insufficient documentation

## 2018-01-18 DIAGNOSIS — M159 Polyosteoarthritis, unspecified: Secondary | ICD-10-CM | POA: Insufficient documentation

## 2018-01-30 ENCOUNTER — Other Ambulatory Visit: Payer: Self-pay

## 2018-01-30 DIAGNOSIS — Z992 Dependence on renal dialysis: Principal | ICD-10-CM

## 2018-01-30 DIAGNOSIS — N186 End stage renal disease: Secondary | ICD-10-CM

## 2018-02-07 ENCOUNTER — Other Ambulatory Visit: Payer: Self-pay | Admitting: *Deleted

## 2018-02-07 ENCOUNTER — Other Ambulatory Visit: Payer: Self-pay

## 2018-02-07 ENCOUNTER — Ambulatory Visit (HOSPITAL_COMMUNITY)
Admission: RE | Admit: 2018-02-07 | Discharge: 2018-02-07 | Disposition: A | Discharge status: Home / Self Care | Payer: Medicare Other | Source: Ambulatory Visit | Attending: Family | Admitting: Family

## 2018-02-07 ENCOUNTER — Ambulatory Visit (INDEPENDENT_AMBULATORY_CARE_PROVIDER_SITE_OTHER): Payer: Medicare Other | Admitting: Physician Assistant

## 2018-02-07 ENCOUNTER — Encounter: Payer: Self-pay | Admitting: *Deleted

## 2018-02-07 VITALS — BP 129/61 | HR 83 | Temp 98.4°F | Resp 14 | Ht 66.0 in | Wt 241.0 lb

## 2018-02-07 DIAGNOSIS — N186 End stage renal disease: Secondary | ICD-10-CM

## 2018-02-07 DIAGNOSIS — Z992 Dependence on renal dialysis: Secondary | ICD-10-CM

## 2018-02-07 NOTE — H&P (View-Only) (Signed)
[facility] [place] HISTORY AND PHYSICAL   History of Present Illness:  Patient is a 72 y.o. year old female who presents with problems cannulating her fistula.  The HD clinic reports pulling out clots during HD.  She is status post left arm fistulogram fistulogram on 08/28/2017.    She had a 90% stenosis at the junction between the cephalic vein and the subclavian vein.  This was performed secondary to a simular problem.    She denise prolonged bleeding, pain and edema in the left UE.  Past medical history includes: ESRD, HTN, CHF, Hyperlipidemia, and DM.  She is not on anticoagulation.  Past Medical History:  Diagnosis Date  . Arthritis   . Cancer [facility]) 2005    left breast  . CHF (congestive heart failure) ([place])   . Chronic back pain   . Chronic kidney disease 03/2014   dialysis t/th/sa  . Coronary artery disease   . Diabetes mellitus    Type 2  . Diabetic nephropathy ([place]) 01-08-13  . Dyslipidemia 01-08-13  . ESRD on hemodialysis ([place])    Tu, Th, Sat  . Headache   . Hyperlipidemia   . Hypertension   . LBBB (left bundle branch block)   . Proteinuria 01-08-13  . Thyroid disease 01-08-13   Hyper-parathyroidism-secondary    Past Surgical History:  Procedure Laterality Date  . A/V FISTULAGRAM N/A 08/25/2017   Procedure: A/V FISTULAGRAM - Left Arm;  Surgeon: Angelia Mould, MD;  Location: [place] CV LAB;  Service: Cardiovascular;  Laterality: N/A;  . ABDOMINAL AORTAGRAM N/A 08/11/2014   Procedure: ABDOMINAL Maxcine Ham;  Surgeon: Angelia Mould, MD;  Location: [facility] CATH LAB;  Service: Cardiovascular;  Laterality: N/A;  . ABDOMINAL HYSTERECTOMY    . APPENDECTOMY    . Arm surgery     Left arm trauma  . AV FISTULA PLACEMENT Left 08/21/2014   Procedure: LEFT ARM ARTERIOVENOUS (AV) FISTULA CREATION ;  Surgeon: Angelia Mould, MD;  Location: [place];  Service: Vascular;  Laterality: Left;  . BACK SURGERY    . CATARACT EXTRACTION W/PHACO Left  12/22/2014   Procedure: CATARACT EXTRACTION PHACO AND INTRAOCULAR LENS PLACEMENT (IOC);  Surgeon: Tonny Branch, MD;  Location[facility];  Service: Ophthalmology;  Laterality: Left;  CDE 13.48  . CORONARY ANGIOPLASTY  12/19/2003   inferior wall hypokinesis. ef 50%  . dialysis catheter    . MASTECTOMY     Left  . PERIPHERAL VASCULAR BALLOON ANGIOPLASTY Left 08/25/2017   Procedure: PERIPHERAL VASCULAR BALLOON ANGIOPLASTY;  Surgeon: Angelia Mould, MD;  Location: [place] CV LAB;  Service: Cardiovascular;  Laterality: Left;  arm fistula  . PERIPHERAL VASCULAR CATHETERIZATION N/A 09/16/2015   Procedure: Fistulagram;  Surgeon: Rosetta Posner, MD;  Location: [place] CV LAB;  Service: Cardiovascular;  Laterality: N/A;  . SPINE SURGERY    . TONSILLECTOMY    . TRANSTHORACIC ECHOCARDIOGRAM  10/01/2010    Left ventricle: The cavity size was mildly dilated. Wall thickness was increased in a pattern of mild LVH. Systolic function was   mildly reduced. The estimated ejection fraction was in the range  of 45% to 50%.   . TUBAL LIGATION       Social History Social History   Tobacco Use  . Smoking status: Former Smoker    Packs/day: 1.00    Years: 50.00    Pack years: 50.00    Types: Cigarettes    Last attempt to quit: 05/16/1998  Years since quitting: 19.7  . Smokeless tobacco: Never Used  Substance Use Topics  . Alcohol use: No    Alcohol/week: 0.0 standard drinks  . Drug use: No    Family History Family History  Problem Relation Age of Onset  . Breast cancer Mother        Died age 72  . Cancer Mother 16       Breast  . Heart disease Mother   . Hyperlipidemia Mother   . Hypertension Mother   . Heart attack Mother     Allergies  Allergies  Allergen Reactions  . Olmesartan Other (See Comments)    Hyperkalemia Hyperkalemia     Current Outpatient Medications  Medication Sig Dispense Refill  . aspirin EC 81 MG EC tablet Take 1 tablet (81 mg total) by mouth daily. 30 tablet 0   . AURYXIA 1 GM 210 MG(Fe) tablet Take 420 mg by mouth 3 (three) times daily with meals.  0  . carvedilol (COREG) 25 MG tablet Take 25 mg by mouth 2 (two) times daily with a meal.    . diclofenac sodium (VOLTAREN) 1 % GEL Apply 1 application topically daily as needed. Right arm    . hydrALAZINE (APRESOLINE) 100 MG tablet Take 100 mg by mouth 3 (three) times daily.    Marland Kitchen HYDROcodone-acetaminophen (NORCO/VICODIN) 5-325 MG tablet Take 2 tablets by mouth every 6 (six) hours as needed for moderate pain or severe pain. 6 tablet 0  . insulin aspart protamine-insulin aspart (NOVOLOG 70/30) (70-30) 100 UNIT/ML injection Inject 20-25 Units into the skin 2 (two) times daily with a meal. 30 UNITS IN THE MORNING AND 25 UNITS AT BEDTIME    . isosorbide mononitrate (IMDUR) 30 MG 24 hr tablet Take 30 mg by mouth daily.     . nitroGLYCERIN (NITROSTAT) 0.4 MG SL tablet Place 0.4 mg under the tongue every 5 (five) minutes as needed for chest pain.    . polyethylene glycol (MIRALAX / GLYCOLAX) packet Take 17 g by mouth 2 (two) times daily.    Marland Kitchen tiZANidine (ZANAFLEX) 4 MG tablet Take 4 mg by mouth at bedtime as needed for muscle spasms.  2   Current Facility-Administered Medications  Medication Dose Route Frequency Provider Last Rate Last Dose  . lidocaine (PF) (XYLOCAINE) 1 % injection 0.3 mL  0.3 mL Other Once Magnus Sinning, MD        ROS:   General:  No weight loss, Fever, chills  HEENT: No recent headaches, no nasal bleeding, no visual changes, no sore throat  Neurologic: No dizziness, blackouts, seizures. No recent symptoms of stroke or mini- stroke. No recent episodes of slurred speech, or temporary blindness.  Cardiac: No recent episodes of chest pain/pressure, no shortness of breath at rest.  No shortness of breath with exertion.  Denies history of atrial fibrillation or irregular heartbeat  Vascular: No history of rest pain in feet.  No history of claudication.  No history of non-healing ulcer, No  history of DVT   Pulmonary: No home oxygen, no productive cough, no hemoptysis,  No asthma or wheezing  Musculoskeletal:  [ ]  Arthritis, [ ]  Low back pain,  [ ]  Joint pain  Hematologic:No history of hypercoagulable state.  No history of easy bleeding.  positive history of anemia  Gastrointestinal: No hematochezia or melena,  No gastroesophageal reflux, no trouble swallowing  Urinary: [ ]  chronic Kidney disease, [x ] on HD - [ ]  MWF or [x ] TTHS, [ ]  Burning with  urination, [ ]  Frequent urination, [ ]  Difficulty urinating;   Skin: No rashes  Psychological: No history of anxiety,  No history of depression   Physical Examination  Vitals:   02/07/18 1543  BP: 129/61  Pulse: 83  Resp: 14  Temp: 98.4 F (36.9 C)  TempSrc: Oral  SpO2: 98%  Weight: 241 lb (109.3 kg)  Height: 5\' 6"  (1.676 m)    Body mass index is 38.9 kg/m.  General:  Alert and oriented, no acute distress HEENT: Normal Neck: No bruit or JVD Pulmonary: Clear to auscultation bilaterally Cardiac: Regular Rate and Rhythm without murmur Gastrointestinal: Soft, non-tender, non-distended, no mass, no scars Skin: No rash Extremity Pulses:  2+ radial, brachial pulses bilaterally, palpable thrill in left UE AV fistula.  Thinning skin over stick sites without scab formation. 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  Hopefully we can regain flow to the fistula.  The patient agrees to proceed as planned.    Roxy Horseman PA-C [facility] [place] Office: [phone]

## 2018-02-07 NOTE — Progress Notes (Signed)
[facility] [place] HISTORY AND PHYSICAL   History of Present Illness:  Patient is a 72 y.o. year old female who presents with problems cannulating her fistula.  The HD clinic reports pulling out clots during HD.  She is status post left arm fistulogram fistulogram on 08/28/2017.    She had a 90% stenosis at the junction between the cephalic vein and the subclavian vein.  This was performed secondary to a simular problem.    She denise prolonged bleeding, pain and edema in the left UE.  Past medical history includes: ESRD, HTN, CHF, Hyperlipidemia, and DM.  She is not on anticoagulation.  Past Medical History:  Diagnosis Date  . Arthritis   . Cancer [facility]) 2005    left breast  . CHF (congestive heart failure) ([place])   . Chronic back pain   . Chronic kidney disease 03/2014   dialysis t/th/sa  . Coronary artery disease   . Diabetes mellitus    Type 2  . Diabetic nephropathy ([place]) 01-08-13  . Dyslipidemia 01-08-13  . ESRD on hemodialysis ([place])    Tu, Th, Sat  . Headache   . Hyperlipidemia   . Hypertension   . LBBB (left bundle branch block)   . Proteinuria 01-08-13  . Thyroid disease 01-08-13   Hyper-parathyroidism-secondary    Past Surgical History:  Procedure Laterality Date  . A/V FISTULAGRAM N/A 08/25/2017   Procedure: A/V FISTULAGRAM - Left Arm;  Surgeon: Angelia Mould, MD;  Location: [facility];  Service: Cardiovascular;  Laterality: N/A;  . ABDOMINAL AORTAGRAM N/A 08/11/2014   Procedure: ABDOMINAL Maxcine Ham;  Surgeon: Angelia Mould, MD;  Location: [facility] CATH LAB;  Service: Cardiovascular;  Laterality: N/A;  . ABDOMINAL HYSTERECTOMY    . APPENDECTOMY    . Arm surgery     Left arm trauma  . AV FISTULA PLACEMENT Left 08/21/2014   Procedure: LEFT ARM ARTERIOVENOUS (AV) FISTULA CREATION ;  Surgeon: Angelia Mould, MD;  Location: [facility];  Service: Vascular;  Laterality: Left;  . BACK SURGERY    . CATARACT EXTRACTION W/PHACO Left  12/22/2014   Procedure: CATARACT EXTRACTION PHACO AND INTRAOCULAR LENS PLACEMENT (IOC);  Surgeon: Tonny Branch, MD;  Location: [facility];  Service: Ophthalmology;  Laterality: Left;  CDE 13.48  . CORONARY ANGIOPLASTY  12/19/2003   inferior wall hypokinesis. ef 50%  . dialysis catheter    . MASTECTOMY     Left  . PERIPHERAL VASCULAR BALLOON ANGIOPLASTY Left 08/25/2017   Procedure: PERIPHERAL VASCULAR BALLOON ANGIOPLASTY;  Surgeon: Angelia Mould, MD;  Location: [facility];  Service: Cardiovascular;  Laterality: Left;  arm fistula  . PERIPHERAL VASCULAR CATHETERIZATION N/A 09/16/2015   Procedure: Fistulagram;  Surgeon: Rosetta Posner, MD;  Location: [facility];  Service: Cardiovascular;  Laterality: N/A;  . SPINE SURGERY    . TONSILLECTOMY    . TRANSTHORACIC ECHOCARDIOGRAM  10/01/2010    Left ventricle: The cavity size was mildly dilated. Wall thickness was increased in a pattern of mild LVH. Systolic function was   mildly reduced. The estimated ejection fraction was in the range  of 45% to 50%.   . TUBAL LIGATION       Social History Social History   Tobacco Use  . Smoking status: Former Smoker    Packs/day: 1.00    Years: 50.00    Pack years: 50.00    Types: Cigarettes    Last attempt to quit: 05/16/1998  Years since quitting: 19.7  . Smokeless tobacco: Never Used  Substance Use Topics  . Alcohol use: No    Alcohol/week: 0.0 standard drinks  . Drug use: No    Family History Family History  Problem Relation Age of Onset  . Breast cancer Mother        Died age 38  . Cancer Mother 70       Breast  . Heart disease Mother   . Hyperlipidemia Mother   . Hypertension Mother   . Heart attack Mother     Allergies  Allergies  Allergen Reactions  . Olmesartan Other (See Comments)    Hyperkalemia Hyperkalemia     Current Outpatient Medications  Medication Sig Dispense Refill  . aspirin EC 81 MG EC tablet Take 1 tablet (81 mg total) by mouth daily. 30 tablet 0   . AURYXIA 1 GM 210 MG(Fe) tablet Take 420 mg by mouth 3 (three) times daily with meals.  0  . carvedilol (COREG) 25 MG tablet Take 25 mg by mouth 2 (two) times daily with a meal.    . diclofenac sodium (VOLTAREN) 1 % GEL Apply 1 application topically daily as needed. Right arm    . hydrALAZINE (APRESOLINE) 100 MG tablet Take 100 mg by mouth 3 (three) times daily.    Marland Kitchen HYDROcodone-acetaminophen (NORCO/VICODIN) 5-325 MG tablet Take 2 tablets by mouth every 6 (six) hours as needed for moderate pain or severe pain. 6 tablet 0  . insulin aspart protamine-insulin aspart (NOVOLOG 70/30) (70-30) 100 UNIT/ML injection Inject 20-25 Units into the skin 2 (two) times daily with a meal. 30 UNITS IN THE MORNING AND 25 UNITS AT BEDTIME    . isosorbide mononitrate (IMDUR) 30 MG 24 hr tablet Take 30 mg by mouth daily.     . nitroGLYCERIN (NITROSTAT) 0.4 MG SL tablet Place 0.4 mg under the tongue every 5 (five) minutes as needed for chest pain.    . polyethylene glycol (MIRALAX / GLYCOLAX) packet Take 17 g by mouth 2 (two) times daily.    Marland Kitchen tiZANidine (ZANAFLEX) 4 MG tablet Take 4 mg by mouth at bedtime as needed for muscle spasms.  2   Current Facility-Administered Medications  Medication Dose Route Frequency Provider Last Rate Last Dose  . lidocaine (PF) (XYLOCAINE) 1 % injection 0.3 mL  0.3 mL Other Once Magnus Sinning, MD        ROS:   General:  No weight loss, Fever, chills  HEENT: No recent headaches, no nasal bleeding, no visual changes, no sore throat  Neurologic: No dizziness, blackouts, seizures. No recent symptoms of stroke or mini- stroke. No recent episodes of slurred speech, or temporary blindness.  Cardiac: No recent episodes of chest pain/pressure, no shortness of breath at rest.  No shortness of breath with exertion.  Denies history of atrial fibrillation or irregular heartbeat  Vascular: No history of rest pain in feet.  No history of claudication.  No history of non-healing ulcer, No  history of DVT   Pulmonary: No home oxygen, no productive cough, no hemoptysis,  No asthma or wheezing  Musculoskeletal:  [ ]  Arthritis, [ ]  Low back pain,  [ ]  Joint pain  Hematologic:No history of hypercoagulable state.  No history of easy bleeding.  positive history of anemia  Gastrointestinal: No hematochezia or melena,  No gastroesophageal reflux, no trouble swallowing  Urinary: [ ]  chronic Kidney disease, [x ] on HD - [ ]  MWF or [x ] TTHS, [ ]  Burning with  urination, [ ]  Frequent urination, [ ]  Difficulty urinating;   Skin: No rashes  Psychological: No history of anxiety,  No history of depression   Physical Examination  Vitals:   02/07/18 1543  BP: 129/61  Pulse: 83  Resp: 14  Temp: 98.4 F (36.9 C)  TempSrc: Oral  SpO2: 98%  Weight: 241 lb (109.3 kg)  Height: 5\' 6"  (1.676 m)    Body mass index is 38.9 kg/m.  General:  Alert and oriented, no acute distress HEENT: Normal Neck: No bruit or JVD Pulmonary: Clear to auscultation bilaterally Cardiac: Regular Rate and Rhythm without murmur Gastrointestinal: Soft, non-tender, non-distended, no mass, no scars Skin: No rash Extremity Pulses:  2+ radial, brachial pulses bilaterally, palpable thrill in left UE AV fistula.  Thinning skin over stick sites without scab formation. 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  Hopefully we can regain flow to the fistula.  The patient agrees to proceed as planned.    Roxy Horseman PA-C [facility] [place] Office: [phone]

## 2018-02-08 ENCOUNTER — Encounter: Payer: Self-pay | Admitting: Physician Assistant

## 2018-02-09 ENCOUNTER — Telehealth: Payer: Self-pay | Admitting: *Deleted

## 2018-02-09 ENCOUNTER — Ambulatory Visit (HOSPITAL_COMMUNITY)
Admission: RE | Admit: 2018-02-09 | Discharge: 2018-02-09 | Disposition: A | Discharge status: Home / Self Care | Payer: Medicare Other | Source: Ambulatory Visit | Attending: Vascular Surgery | Admitting: Vascular Surgery

## 2018-02-09 ENCOUNTER — Other Ambulatory Visit: Payer: Self-pay

## 2018-02-09 ENCOUNTER — Encounter (HOSPITAL_COMMUNITY)
Admission: RE | Disposition: A | Discharge status: Home / Self Care | Payer: Self-pay | Source: Ambulatory Visit | Attending: Vascular Surgery

## 2018-02-09 DIAGNOSIS — M1990 Unspecified osteoarthritis, unspecified site: Secondary | ICD-10-CM | POA: Insufficient documentation

## 2018-02-09 DIAGNOSIS — E1121 Type 2 diabetes mellitus with diabetic nephropathy: Secondary | ICD-10-CM | POA: Diagnosis not present

## 2018-02-09 DIAGNOSIS — T82898A Other specified complication of vascular prosthetic devices, implants and grafts, initial encounter: Secondary | ICD-10-CM

## 2018-02-09 DIAGNOSIS — E079 Disorder of thyroid, unspecified: Secondary | ICD-10-CM | POA: Insufficient documentation

## 2018-02-09 DIAGNOSIS — Z87891 Personal history of nicotine dependence: Secondary | ICD-10-CM | POA: Diagnosis not present

## 2018-02-09 DIAGNOSIS — G8929 Other chronic pain: Secondary | ICD-10-CM | POA: Diagnosis not present

## 2018-02-09 DIAGNOSIS — E1122 Type 2 diabetes mellitus with diabetic chronic kidney disease: Secondary | ICD-10-CM | POA: Insufficient documentation

## 2018-02-09 DIAGNOSIS — I447 Left bundle-branch block, unspecified: Secondary | ICD-10-CM | POA: Diagnosis not present

## 2018-02-09 DIAGNOSIS — Y832 Surgical operation with anastomosis, bypass or graft as the cause of abnormal reaction of the patient, or of later complication, without mention of misadventure at the time of the procedure: Secondary | ICD-10-CM | POA: Insufficient documentation

## 2018-02-09 DIAGNOSIS — Z7982 Long term (current) use of aspirin: Secondary | ICD-10-CM | POA: Diagnosis not present

## 2018-02-09 DIAGNOSIS — E785 Hyperlipidemia, unspecified: Secondary | ICD-10-CM | POA: Insufficient documentation

## 2018-02-09 DIAGNOSIS — Z794 Long term (current) use of insulin: Secondary | ICD-10-CM | POA: Insufficient documentation

## 2018-02-09 DIAGNOSIS — T82858A Stenosis of vascular prosthetic devices, implants and grafts, initial encounter: Secondary | ICD-10-CM | POA: Diagnosis present

## 2018-02-09 DIAGNOSIS — I509 Heart failure, unspecified: Secondary | ICD-10-CM | POA: Diagnosis not present

## 2018-02-09 DIAGNOSIS — I2510 Atherosclerotic heart disease of native coronary artery without angina pectoris: Secondary | ICD-10-CM | POA: Insufficient documentation

## 2018-02-09 DIAGNOSIS — N186 End stage renal disease: Secondary | ICD-10-CM | POA: Insufficient documentation

## 2018-02-09 DIAGNOSIS — I132 Hypertensive heart and chronic kidney disease with heart failure and with stage 5 chronic kidney disease, or end stage renal disease: Secondary | ICD-10-CM | POA: Insufficient documentation

## 2018-02-09 DIAGNOSIS — Z992 Dependence on renal dialysis: Secondary | ICD-10-CM

## 2018-02-09 HISTORY — PX: PERIPHERAL VASCULAR BALLOON ANGIOPLASTY: CATH118281

## 2018-02-09 HISTORY — PX: A/V FISTULAGRAM: CATH118298

## 2018-02-09 LAB — POCT I-STAT, CHEM 8
BUN: 31 mg/dL — ABNORMAL HIGH (ref 8–23)
CALCIUM ION: 1.09 mmol/L — AB (ref 1.15–1.40)
CHLORIDE: 92 mmol/L — AB (ref 98–111)
CREATININE: 6.6 mg/dL — AB (ref 0.44–1.00)
GLUCOSE: 180 mg/dL — AB (ref 70–99)
HCT: 32 % — ABNORMAL LOW (ref 36.0–46.0)
Hemoglobin: 10.9 g/dL — ABNORMAL LOW (ref 12.0–15.0)
Potassium: 3.3 mmol/L — ABNORMAL LOW (ref 3.5–5.1)
Sodium: 135 mmol/L (ref 135–145)
TCO2: 33 mmol/L — ABNORMAL HIGH (ref 22–32)

## 2018-02-09 LAB — GLUCOSE, CAPILLARY: GLUCOSE-CAPILLARY: 139 mg/dL — AB (ref 70–99)

## 2018-02-09 SURGERY — A/V FISTULAGRAM
Anesthesia: LOCAL

## 2018-02-09 MED ORDER — SODIUM CHLORIDE 0.9% FLUSH: 3.0000 mL | INTRAVENOUS | Status: DC | PRN

## 2018-02-09 MED ORDER — HYDRALAZINE HCL 20 MG/ML IJ SOLN: 5.0000 mg | INTRAMUSCULAR | Status: DC | PRN

## 2018-02-09 MED ORDER — ACETAMINOPHEN 325 MG PO TABS: 650.0000 mg | ORAL_TABLET | ORAL | Status: DC | PRN

## 2018-02-09 MED ORDER — FENTANYL CITRATE (PF) 100 MCG/2ML IJ SOLN
INTRAMUSCULAR | Status: DC | PRN
  Administered 2018-02-09: 25 ug via INTRAVENOUS

## 2018-02-09 MED ORDER — LABETALOL HCL 5 MG/ML IV SOLN: 10.0000 mg | INTRAVENOUS | Status: DC | PRN

## 2018-02-09 MED ORDER — HEPARIN (PORCINE) IN NACL 1000-0.9 UT/500ML-% IV SOLN
INTRAVENOUS | Status: DC | PRN
  Administered 2018-02-09: 500 mL

## 2018-02-09 MED ORDER — MORPHINE SULFATE (PF) 10 MG/ML IV SOLN: 2.0000 mg | INTRAVENOUS | Status: DC | PRN

## 2018-02-09 MED ORDER — LIDOCAINE HCL (PF) 1 % IJ SOLN
INTRAMUSCULAR | Status: DC | PRN
  Administered 2018-02-09: 2 mL via INTRADERMAL

## 2018-02-09 MED ORDER — ONDANSETRON HCL 4 MG/2ML IJ SOLN: 4.0000 mg | Freq: Four times a day (QID) | INTRAMUSCULAR | Status: DC | PRN

## 2018-02-09 MED ORDER — FENTANYL CITRATE (PF) 100 MCG/2ML IJ SOLN
INTRAMUSCULAR | Status: AC
  Filled 2018-02-09: qty 2

## 2018-02-09 MED ORDER — LIDOCAINE HCL (PF) 1 % IJ SOLN
INTRAMUSCULAR | Status: AC
  Filled 2018-02-09: qty 30

## 2018-02-09 MED ORDER — SODIUM CHLORIDE 0.9% FLUSH: 3.0000 mL | Freq: Two times a day (BID) | INTRAVENOUS | Status: DC

## 2018-02-09 MED ORDER — OXYCODONE HCL 5 MG PO TABS: 5.0000 mg | ORAL_TABLET | ORAL | Status: DC | PRN

## 2018-02-09 MED ORDER — HEPARIN (PORCINE) IN NACL 1000-0.9 UT/500ML-% IV SOLN
INTRAVENOUS | Status: AC
  Filled 2018-02-09: qty 500

## 2018-02-09 MED ORDER — IODIXANOL 320 MG/ML IV SOLN
INTRAVENOUS | Status: DC | PRN
  Administered 2018-02-09: 60 mL via INTRAVENOUS

## 2018-02-09 MED ORDER — SODIUM CHLORIDE 0.9 % IV SOLN: 250.0000 mL | INTRAVENOUS | Status: DC | PRN

## 2018-02-09 MED ORDER — HEPARIN SODIUM (PORCINE) 1000 UNIT/ML IJ SOLN
INTRAMUSCULAR | Status: DC | PRN
  Administered 2018-02-09: 3000 [IU] via INTRAVENOUS

## 2018-02-09 SURGICAL SUPPLY — 17 items
BAG SNAP BAND KOVER 36X36 (MISCELLANEOUS) ×3 IMPLANT
BALLN MUSTANG 10X20X75 (BALLOONS) ×3
BALLN MUSTANG 8.0X40 75 (BALLOONS) ×3
BALLOON MUSTANG 10X20X75 (BALLOONS) ×2 IMPLANT
BALLOON MUSTANG 8.0X40 75 (BALLOONS) ×2 IMPLANT
COVER DOME SNAP 22 D (MISCELLANEOUS) ×3 IMPLANT
COVER PRB 48X5XTLSCP FOLD TPE (BAG) ×2 IMPLANT
COVER PROBE 5X48 (BAG) ×1
KIT ENCORE 26 ADVANTAGE (KITS) ×3 IMPLANT
KIT MICROPUNCTURE NIT STIFF (SHEATH) ×3 IMPLANT
PROTECTION STATION PRESSURIZED (MISCELLANEOUS) ×3
SHEATH PINNACLE R/O II 6F 4CM (SHEATH) ×3 IMPLANT
STATION PROTECTION PRESSURIZED (MISCELLANEOUS) ×2 IMPLANT
STOPCOCK MORSE 400PSI 3WAY (MISCELLANEOUS) ×3 IMPLANT
TRAY PV CATH (CUSTOM PROCEDURE TRAY) ×3 IMPLANT
TUBING CIL FLEX 10 FLL-RA (TUBING) ×3 IMPLANT
WIRE BENTSON .035X145CM (WIRE) ×3 IMPLANT

## 2018-02-09 NOTE — Telephone Encounter (Signed)
-----   Message from Elam Dutch, MD sent at 02/09/2018  1:55 PM EDT ----- rocedure: Left upper extremity fistulogram with angioplasty of venous outflow stenosis (8 x 40, 10 x 2 angioplasty balloon)   Successful angioplasty of the patient's AV fistula.  This was last done approximately 5 months ago.  She would be a candidate to have another angioplasty if she develops narrowing in the future.  Ruta Hinds, MD [facility] [place] Office: [phone] Pager: [phone]

## 2018-02-09 NOTE — Op Note (Signed)
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  The patient was placed in supine position Angio table.  Left upper extremities prepped and draped in usual sterile fashion.  Ultrasound was used to identify the left arm AV fistula image was obtained for the patient's permanent record the fistula was patent micropuncture needle was used to cannulate the fistula under direct vision of the ultrasound guidance.  A puncture needle was advanced into the fistula.  Micro puncture sheath was then placed over this.  Contrast angiogram was then obtained through the micropuncture sheath.  This showed widely patent central veins.  There is a 70% narrowing of the cephalic subclavian junction.  I was unable to reflux contrast across the arterial anastomosis due to excellent arterial inflow.  At this point the micropuncture sheath was removed over a Bentson wire and exchanged for 6 French short sheath.  I then proceeded to angioplasty the lesion with an 8 mm x 4 cm balloon to nominal pressure for 1 minute.  There was still a small amount of waist at the junction of the cephalic vein subclavian so this was again angioplastied for 1 minute with a 10 x 20 balloon.  Completion angiogram showed widely patent fistula no residual stenosis no evidence of dissection.  This point the sheath was removed and the hole closed with a figure-of-eight stitch.  The patient had been given 3000 units of heparin during the course of the case.  The patient tired procedure well and there were no complications.  Patient was taken to holding area in  stable condition.  The fistula is ready for use.  Operative management: Successful angioplasty of the patient's AV fistula.  This was last done approximately 5 months ago.  She would be a candidate to have another angioplasty if she develops narrowing in the future.  Ruta Hinds, MD [facility] [place] Office: [phone] Pager: [phone]

## 2018-02-09 NOTE — Interval H&P Note (Signed)
History and Physical Interval Note:  02/09/2018 12:59 PM  Alexis [name]  has presented today for surgery, with the diagnosis of complication w/ fistula  The various methods of treatment have been discussed with the patient and family. After consideration of risks, benefits and other options for treatment, the patient has consented to  Procedure(s): A/V FISTULAGRAM (N/A) as a surgical intervention .  The patient's history has been reviewed, patient examined, no change in status, stable for surgery.  I have reviewed the patient's chart and labs.  Questions were answered to the patient's satisfaction.     Ruta Hinds

## 2018-02-09 NOTE — Discharge Instructions (Signed)

## 2018-02-09 NOTE — Interval H&P Note (Signed)
History and Physical Interval Note:  02/09/2018 12:52 PM  Alexis [name]  has presented today for surgery, with the diagnosis of complication w/ fistula  The various methods of treatment have been discussed with the patient and family. After consideration of risks, benefits and other options for treatment, the patient has consented to  Procedure(s): A/V FISTULAGRAM (N/A) as a surgical intervention .  The patient's history has been reviewed, patient examined, no change in status, stable for surgery.  I have reviewed the patient's chart and labs.  Questions were answered to the patient's satisfaction.     Ruta Hinds

## 2018-02-12 ENCOUNTER — Encounter (HOSPITAL_COMMUNITY): Payer: Self-pay | Admitting: Vascular Surgery

## 2018-04-03 ENCOUNTER — Telehealth: Payer: Self-pay | Admitting: *Deleted

## 2018-04-03 ENCOUNTER — Encounter: Payer: Self-pay | Admitting: *Deleted

## 2018-04-03 ENCOUNTER — Other Ambulatory Visit: Payer: Self-pay | Admitting: *Deleted

## 2018-04-03 NOTE — Progress Notes (Signed)
Schedule fistulogram for 04/04/18 per Dr. Carlis Abbott. "Access pulling clots, not running at prescribed blood flow rate, patient c/o pain at access" per [facility].

## 2018-04-03 NOTE — Telephone Encounter (Signed)
Patient called and instructed to be at [facility] admitting department at 8 am on 04/04/18 for fistulogram. Instructed to take 70% of HS insulin tonight and have a high protein snack prior to MN Then NPO past MN except for the following medications in the am with sips of water: Carvedilol, hydralazine, isosorbide. Hold am and insulin and all other medications until after this procedure. Must have a driver and caregiver for discharge to home. Spoke with patient and her AID. Verbalized understanding.

## 2018-04-04 ENCOUNTER — Other Ambulatory Visit: Payer: Self-pay

## 2018-04-04 ENCOUNTER — Encounter (HOSPITAL_COMMUNITY)
Admission: RE | Disposition: A | Discharge status: Home / Self Care | Payer: Self-pay | Source: Ambulatory Visit | Attending: Vascular Surgery

## 2018-04-04 ENCOUNTER — Ambulatory Visit (HOSPITAL_COMMUNITY)
Admission: RE | Admit: 2018-04-04 | Discharge: 2018-04-04 | Disposition: A | Discharge status: Home / Self Care | Payer: Medicare Other | Source: Ambulatory Visit | Attending: Vascular Surgery | Admitting: Vascular Surgery

## 2018-04-04 DIAGNOSIS — Z853 Personal history of malignant neoplasm of breast: Secondary | ICD-10-CM | POA: Insufficient documentation

## 2018-04-04 DIAGNOSIS — E785 Hyperlipidemia, unspecified: Secondary | ICD-10-CM | POA: Diagnosis not present

## 2018-04-04 DIAGNOSIS — Z8249 Family history of ischemic heart disease and other diseases of the circulatory system: Secondary | ICD-10-CM | POA: Insufficient documentation

## 2018-04-04 DIAGNOSIS — Z888 Allergy status to other drugs, medicaments and biological substances status: Secondary | ICD-10-CM | POA: Diagnosis not present

## 2018-04-04 DIAGNOSIS — I447 Left bundle-branch block, unspecified: Secondary | ICD-10-CM | POA: Insufficient documentation

## 2018-04-04 DIAGNOSIS — E1122 Type 2 diabetes mellitus with diabetic chronic kidney disease: Secondary | ICD-10-CM | POA: Diagnosis not present

## 2018-04-04 DIAGNOSIS — Z9012 Acquired absence of left breast and nipple: Secondary | ICD-10-CM | POA: Insufficient documentation

## 2018-04-04 DIAGNOSIS — Z90710 Acquired absence of both cervix and uterus: Secondary | ICD-10-CM | POA: Diagnosis not present

## 2018-04-04 DIAGNOSIS — Z794 Long term (current) use of insulin: Secondary | ICD-10-CM | POA: Diagnosis not present

## 2018-04-04 DIAGNOSIS — Z87891 Personal history of nicotine dependence: Secondary | ICD-10-CM | POA: Insufficient documentation

## 2018-04-04 DIAGNOSIS — Z992 Dependence on renal dialysis: Secondary | ICD-10-CM | POA: Insufficient documentation

## 2018-04-04 DIAGNOSIS — M1990 Unspecified osteoarthritis, unspecified site: Secondary | ICD-10-CM | POA: Diagnosis not present

## 2018-04-04 DIAGNOSIS — E1121 Type 2 diabetes mellitus with diabetic nephropathy: Secondary | ICD-10-CM | POA: Insufficient documentation

## 2018-04-04 DIAGNOSIS — I132 Hypertensive heart and chronic kidney disease with heart failure and with stage 5 chronic kidney disease, or end stage renal disease: Secondary | ICD-10-CM | POA: Insufficient documentation

## 2018-04-04 DIAGNOSIS — E079 Disorder of thyroid, unspecified: Secondary | ICD-10-CM | POA: Insufficient documentation

## 2018-04-04 DIAGNOSIS — Y832 Surgical operation with anastomosis, bypass or graft as the cause of abnormal reaction of the patient, or of later complication, without mention of misadventure at the time of the procedure: Secondary | ICD-10-CM | POA: Diagnosis not present

## 2018-04-04 DIAGNOSIS — I509 Heart failure, unspecified: Secondary | ICD-10-CM | POA: Diagnosis not present

## 2018-04-04 DIAGNOSIS — T82898A Other specified complication of vascular prosthetic devices, implants and grafts, initial encounter: Secondary | ICD-10-CM

## 2018-04-04 DIAGNOSIS — T82858A Stenosis of vascular prosthetic devices, implants and grafts, initial encounter: Secondary | ICD-10-CM | POA: Diagnosis present

## 2018-04-04 DIAGNOSIS — I2510 Atherosclerotic heart disease of native coronary artery without angina pectoris: Secondary | ICD-10-CM | POA: Insufficient documentation

## 2018-04-04 DIAGNOSIS — Z803 Family history of malignant neoplasm of breast: Secondary | ICD-10-CM | POA: Insufficient documentation

## 2018-04-04 DIAGNOSIS — Z9889 Other specified postprocedural states: Secondary | ICD-10-CM | POA: Diagnosis not present

## 2018-04-04 DIAGNOSIS — Z79899 Other long term (current) drug therapy: Secondary | ICD-10-CM | POA: Diagnosis not present

## 2018-04-04 DIAGNOSIS — N186 End stage renal disease: Secondary | ICD-10-CM | POA: Insufficient documentation

## 2018-04-04 DIAGNOSIS — Z9842 Cataract extraction status, left eye: Secondary | ICD-10-CM | POA: Diagnosis not present

## 2018-04-04 DIAGNOSIS — Z7982 Long term (current) use of aspirin: Secondary | ICD-10-CM | POA: Insufficient documentation

## 2018-04-04 HISTORY — PX: PERIPHERAL VASCULAR BALLOON ANGIOPLASTY: CATH118281

## 2018-04-04 HISTORY — PX: A/V FISTULAGRAM: CATH118298

## 2018-04-04 LAB — POCT I-STAT, CHEM 8
BUN: 55 mg/dL — ABNORMAL HIGH (ref 8–23)
CREATININE: 13 mg/dL — AB (ref 0.44–1.00)
Calcium, Ion: 1.01 mmol/L — ABNORMAL LOW (ref 1.15–1.40)
Chloride: 100 mmol/L (ref 98–111)
GLUCOSE: 189 mg/dL — AB (ref 70–99)
HCT: 30 % — ABNORMAL LOW (ref 36.0–46.0)
HEMOGLOBIN: 10.2 g/dL — AB (ref 12.0–15.0)
Potassium: 4 mmol/L (ref 3.5–5.1)
Sodium: 134 mmol/L — ABNORMAL LOW (ref 135–145)
TCO2: 24 mmol/L (ref 22–32)

## 2018-04-04 SURGERY — A/V FISTULAGRAM
Anesthesia: LOCAL | Laterality: Left

## 2018-04-04 MED ORDER — LIDOCAINE HCL (PF) 1 % IJ SOLN
INTRAMUSCULAR | Status: DC | PRN
  Administered 2018-04-04: 5 mL

## 2018-04-04 MED ORDER — FENTANYL CITRATE (PF) 100 MCG/2ML IJ SOLN
INTRAMUSCULAR | Status: DC | PRN
  Administered 2018-04-04 (×2): 25 ug via INTRAVENOUS

## 2018-04-04 MED ORDER — MIDAZOLAM HCL 2 MG/2ML IJ SOLN
INTRAMUSCULAR | Status: DC | PRN
  Administered 2018-04-04: 1 mg via INTRAVENOUS

## 2018-04-04 MED ORDER — FENTANYL CITRATE (PF) 100 MCG/2ML IJ SOLN
INTRAMUSCULAR | Status: AC
  Filled 2018-04-04: qty 2

## 2018-04-04 MED ORDER — SODIUM CHLORIDE 0.9% FLUSH: 3.0000 mL | INTRAVENOUS | Status: DC | PRN

## 2018-04-04 MED ORDER — MIDAZOLAM HCL 2 MG/2ML IJ SOLN
INTRAMUSCULAR | Status: AC
  Filled 2018-04-04: qty 2

## 2018-04-04 MED ORDER — IBUPROFEN 400 MG PO TABS
400.0000 mg | ORAL_TABLET | ORAL | Status: AC
  Administered 2018-04-04: 400 mg via ORAL
  Filled 2018-04-04: qty 1

## 2018-04-04 MED ORDER — LIDOCAINE HCL (PF) 1 % IJ SOLN
INTRAMUSCULAR | Status: AC
  Filled 2018-04-04: qty 30

## 2018-04-04 MED ORDER — HEPARIN SODIUM (PORCINE) 1000 UNIT/ML IJ SOLN
INTRAMUSCULAR | Status: DC | PRN
  Administered 2018-04-04: 3000 [IU] via INTRAVENOUS

## 2018-04-04 MED ORDER — HEPARIN (PORCINE) IN NACL 1000-0.9 UT/500ML-% IV SOLN
INTRAVENOUS | Status: AC
  Filled 2018-04-04: qty 500

## 2018-04-04 MED ORDER — IODIXANOL 320 MG/ML IV SOLN
INTRAVENOUS | Status: DC | PRN
  Administered 2018-04-04: 65 mL via INTRAVENOUS

## 2018-04-04 MED ORDER — HEPARIN (PORCINE) IN NACL 1000-0.9 UT/500ML-% IV SOLN
INTRAVENOUS | Status: DC | PRN
  Administered 2018-04-04: 500 mL

## 2018-04-04 SURGICAL SUPPLY — 16 items
BAG SNAP BAND KOVER 36X36 (MISCELLANEOUS) ×4 IMPLANT
BALLN LUTONIX AV 10X60X75 (BALLOONS) ×2
BALLN MUSTANG 8.0X40 75 (BALLOONS) ×2
BALLOON LUTONIX AV 10X60X75 (BALLOONS) ×1 IMPLANT
BALLOON MUSTANG 8.0X40 75 (BALLOONS) ×1 IMPLANT
COVER DOME SNAP 22 D (MISCELLANEOUS) ×2 IMPLANT
KIT ENCORE 26 ADVANTAGE (KITS) ×2 IMPLANT
KIT MICROPUNCTURE NIT STIFF (SHEATH) ×2 IMPLANT
PROTECTION STATION PRESSURIZED (MISCELLANEOUS) ×2
SHEATH PINNACLE 8F 10CM (SHEATH) ×2 IMPLANT
SHEATH PROBE COVER 6X72 (BAG) ×4 IMPLANT
STATION PROTECTION PRESSURIZED (MISCELLANEOUS) ×1 IMPLANT
STOPCOCK MORSE 400PSI 3WAY (MISCELLANEOUS) ×2 IMPLANT
TRAY PV CATH (CUSTOM PROCEDURE TRAY) ×2 IMPLANT
TUBING CIL FLEX 10 FLL-RA (TUBING) ×2 IMPLANT
WIRE BENTSON .035X145CM (WIRE) ×2 IMPLANT

## 2018-04-04 NOTE — Op Note (Signed)
    OPERATIVE NOTE   PROCEDURE: 1. Left brachiocephalic arteriovenous fistula cannulation under ultrasound guidance 2. Left arm fistulogram 3. Left cephalic vein outflow stenosis angioplasty (8 mm x 40 mm Mustang and 10 mm x 60 mm drug coated Lutonix) 4. 31 minutes of monitored moderate conscious sedation  PRE-OPERATIVE DIAGNOSIS: Malfunctioning left brachiocephalic arteriovenous fistula  POST-OPERATIVE DIAGNOSIS: same as above   SURGEON: Marty Heck, MD  ANESTHESIA: local  ESTIMATED BLOOD LOSS: 5 cc  FINDING(S): 1. There was a greater than 90% stenosis of the cephalic vein at the junction with the axillary vein as a severe outflow stenosis.  After balloon angioplasty including drug-coated balloon angioplasty there was no significant residual stenosis.  SPECIMEN(S):  None  CONTRAST: 65 cc  INDICATIONS: Alexis [name] is a 72 y.o. female who  presents with malfunctioning left brachiocephalic arteriovenous fistula.  The patient is scheduled for left fistlogram.  The patient is aware the risks include but are not limited to: bleeding, infection, thrombosis of the cannulated access, and possible anaphylactic reaction to the contrast.  The patient is aware of the risks of the procedure and elects to proceed forward.  DESCRIPTION: After full informed written consent was obtained, the patient was brought back to the angiography suite and placed supine upon the angiography table.  The patient was connected to monitoring equipment.  The left arm was prepped and draped in the standard fashion for a left arm fistulogram.  Under ultrasound guidance, the left brachiocephalic arteriovenous fistula was cannulated with a micropuncture needle.  The microwire was advanced into the fistula and the needle was exchanged for the a microsheath, which was lodged 2 cm into the access.  The wire was removed and the sheath was connected to the IV extension tubing.  Hand injections were completed to  image the access from the antecubitum up to the level of axilla.  The central venous structures were also imaged by hand injections.  Based on the images, this patient will need: angioplasty of the cephalic vein at the junction with the axillary vein as a severe >90% outflow stenosis.  At that point in time I placed a Bentson wire across the stenosis in the cephalic vein and exchanged for a short 8 [place] sheath.  The patient was given 3000 units of IV heparin.  Initially selected a 8 mm x 40 mm Mustang and predilated the cephalic vein outflow stenosis at the junction of the axillary vein.  I did a brief hand injected arteriogram and confirmed improvement in the residual stenosis and no evidence of perforation.  At that point time I then selected 10 mm x 60 mm drug-coated Lutonix that was then deployed across the cephalic vein outflow stenosis and held to nominal pressure for 2 minutes to allow the drug to coat the lesion.  A final injection of the cephalic vein outflow after the balloon was removed showed no significant residual stenosis and the patient had a brisk thrill in the fistula.  A 4-0 Monocryl purse-string suture was sewn around the sheath.  The sheath was removed while tying down the suture.  A sterile bandage was applied to the puncture site.  COMPLICATIONS: None  CONDITION: Stable  Marty Heck, MD [facility] [place] Office: [phone] Pager: [phone]  04/04/2018 1:15 PM   Marty Heck

## 2018-04-04 NOTE — Discharge Instructions (Signed)

## 2018-04-04 NOTE — H&P (Signed)
H&P     MRN #:  [id number]  History of Present Illness: This is a 72 y.o. female with multiple medical problems that presents for left upper extremity fistulogram.  She had a left brachiocephalic AVF placed by Dr. Oneida Alar about 8 months ago.  States she was at dialysis yesterday and they were pulling clots out of the fistula.  She states it did not work well yesterday and only one needle was placed.  Previously underwent fistulogram with Dr. 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    Past Medical History:  Diagnosis Date  . Arthritis   . Cancer [facility]) 2005    left breast  . CHF (congestive heart failure) ([place])   . Chronic back pain   . Chronic kidney disease 03/2014   dialysis t/th/sa  . Coronary artery disease   . Diabetes mellitus    Type 2  . Diabetic nephropathy ([place]) 01-08-13  . Dyslipidemia 01-08-13  . ESRD on hemodialysis ([place])    Tu, Th, Sat  . Headache   . Hyperlipidemia   . Hypertension   . LBBB (left bundle branch block)   . Proteinuria 01-08-13  . Thyroid disease 01-08-13   Hyper-parathyroidism-secondary    Past Surgical History:  Procedure Laterality Date  . A/V FISTULAGRAM N/A 08/25/2017   Procedure: A/V FISTULAGRAM - Left Arm;  Surgeon: Angelia Mould, MD;  Location: [place] [facility];  Service: Cardiovascular;  Laterality: N/A;  . A/V FISTULAGRAM N/A 02/09/2018   Procedure: A/V RJJOACZYSAY;  Surgeon: Elam Dutch, MD;  Location: [place] [facility];  Service: Cardiovascular;  Laterality: N/A;  . ABDOMINAL AORTAGRAM N/A 08/11/2014   Procedure: ABDOMINAL Maxcine Ham;  Surgeon: Angelia Mould, MD;  Location: [facility] CATH LAB;  Service: Cardiovascular;  Laterality: N/A;  . ABDOMINAL HYSTERECTOMY    . APPENDECTOMY    . Arm surgery     Left arm trauma  . AV FISTULA PLACEMENT Left 08/21/2014   Procedure: LEFT ARM ARTERIOVENOUS (AV) FISTULA CREATION ;  Surgeon: Angelia Mould, MD;  Location: [place];  Service: Vascular;  Laterality:  Left;  . BACK SURGERY    . CATARACT EXTRACTION W/PHACO Left 12/22/2014   Procedure: CATARACT EXTRACTION PHACO AND INTRAOCULAR LENS PLACEMENT (IOC);  Surgeon: Tonny Branch, MD;  Location: [facility];  Service: Ophthalmology;  Laterality: Left;  CDE 13.48  . CORONARY ANGIOPLASTY  12/19/2003   inferior wall hypokinesis. ef 50%  . dialysis catheter    . MASTECTOMY     Left  . PERIPHERAL VASCULAR BALLOON ANGIOPLASTY Left 08/25/2017   Procedure: PERIPHERAL VASCULAR BALLOON ANGIOPLASTY;  Surgeon: Angelia Mould, MD;  Location: [place] [facility];  Service: Cardiovascular;  Laterality: Left;  arm fistula  . PERIPHERAL VASCULAR BALLOON ANGIOPLASTY Left 02/09/2018   Procedure: PERIPHERAL VASCULAR BALLOON ANGIOPLASTY;  Surgeon: Elam Dutch, MD;  Location: [place] [facility];  Service: Cardiovascular;  Laterality: Left;  AV Fistula  . PERIPHERAL VASCULAR CATHETERIZATION N/A 09/16/2015   Procedure: Fistulagram;  Surgeon: Rosetta Posner, MD;  Location: [place] [facility];  Service: Cardiovascular;  Laterality: N/A;  . SPINE SURGERY    . TONSILLECTOMY    . TRANSTHORACIC ECHOCARDIOGRAM  10/01/2010    Left ventricle: The cavity size was mildly dilated. Wall thickness was increased in a pattern of mild LVH. Systolic function was   mildly reduced. The estimated ejection fraction was in the range  of 45% to 50%.   . TUBAL LIGATION  Allergies  Allergen Reactions  . Olmesartan Other (See Comments)    Hyperkalemia Hyperkalemia    Prior to Admission medications   Medication Sig Start Date End Date Taking? Authorizing Provider  aspirin EC 81 MG EC tablet Take 1 tablet (81 mg total) by mouth daily. 08/05/17  Yes Osei-Bonsu, Iona Beard, MD  atorvastatin (LIPITOR) 80 MG tablet Take 80 mg by mouth daily. 01/17/18  Yes [provider]  AURYXIA 1 GM 210 MG(Fe) tablet Take 420 mg by mouth 3 (three) times daily with meals. Take 2 tablets (420 mg) by mouth 3 times daily with meals & take 1 tablet (210 mg) by mouth  once daily with a snack. 07/25/17  Yes [provider]  carvedilol (COREG) 6.25 MG tablet Take 6.25 mg by mouth 2 (two) times daily. 01/17/18  Yes [provider]  diclofenac sodium (VOLTAREN) 1 % GEL Apply 1 application topically daily as needed (pain.).    Yes [provider]  docusate sodium (COLACE) 100 MG capsule Take 100 mg by mouth every other day.   Yes [provider]  DULoxetine (CYMBALTA) 30 MG capsule Take 30 mg by mouth daily. 01/17/18  Yes [provider]  gabapentin (NEURONTIN) 100 MG capsule Take 100 mg by mouth at bedtime and may repeat dose one time if needed.  01/17/18  Yes [provider]  hydrALAZINE (APRESOLINE) 100 MG tablet Take 100 mg by mouth 3 (three) times daily.   Yes [provider]  isosorbide mononitrate (IMDUR) 30 MG 24 hr tablet Take 30 mg by mouth daily.  08/03/15  Yes [provider]  lidocaine-prilocaine (EMLA) cream Apply 1 application topically daily as needed (prior to port being accessed).    Yes [provider]  NOVOLOG MIX 70/30 FLEXPEN (70-30) 100 UNIT/ML FlexPen Inject 30 Units into the skin daily at 6 PM. 1900 01/17/18  Yes [provider]  polyethylene glycol (MIRALAX / GLYCOLAX) packet Take 17 g by mouth daily as needed for mild constipation.    Yes [provider]  tiZANidine (ZANAFLEX) 4 MG tablet Take 4 mg by mouth at bedtime as needed for muscle spasms. 08/22/17  Yes [provider]  HYDROcodone-acetaminophen (NORCO/VICODIN) 5-325 MG tablet Take 2 tablets by mouth every 6 (six) hours as needed for moderate pain or severe pain. Patient not taking: Reported on 04/04/2018 08/27/17   Avie Echevaria B, PA-C  nitroGLYCERIN (NITROSTAT) 0.4 MG SL tablet Place 0.4 mg under the tongue every 5 (five) minutes as needed for chest pain.    [provider]    Social History   Socioeconomic History  . Marital status: Legally Separated    Spouse name: Not  on file  . Number of children: 3  . Years of education: Not on file  . Highest education level: Not on file  Occupational History  . Occupation: Retired  Scientific laboratory technician  . Financial resource strain: Not on file  . Food insecurity:    Worry: Not on file    Inability: Not on file  . Transportation needs:    Medical: Not on file    Non-medical: Not on file  Tobacco Use  . Smoking status: Former Smoker    Packs/day: 1.00    Years: 50.00    Pack years: 50.00    Types: Cigarettes    Last attempt to quit: 05/16/1998    Years since quitting: 19.8  . Smokeless tobacco: Never Used  Substance and Sexual Activity  . Alcohol use: No  Alcohol/week: 0.0 standard drinks  . Drug use: No  . Sexual activity: Yes    Birth control/protection: Post-menopausal  Lifestyle  . Physical activity:    Days per week: Not on file    Minutes per session: Not on file  . Stress: Not on file  Relationships  . Social connections:    Talks on phone: Not on file    Gets together: Not on file    Attends religious service: Not on file    Active member of club or organization: Not on file    Attends meetings of clubs or organizations: Not on file    Relationship status: Not on file  . Intimate partner violence:    Fear of current or ex partner: Not on file    Emotionally abused: Not on file    Physically abused: Not on file    Forced sexual activity: Not on file  Other Topics Concern  . Not on file  Social History Narrative   Lives at [place] alone (most of the time).  Three grandchildren and a one year old great grandchild     Family History  Problem Relation Age of Onset  . Breast cancer Mother        Died age 52  . Cancer Mother 10       Breast  . Heart disease Mother   . Hyperlipidemia Mother   . Hypertension Mother   . Heart attack Mother     ROS: [x]  Positive   [ ]  Negative   [ ]  All sytems reviewed and are negative  Cardiovascular: []  chest pain/pressure []  palpitations []  SOB lying  flat []  DOE []  pain in legs while walking []  pain in legs at rest []  pain in legs at night []  non-healing ulcers []  hx of DVT []  swelling in legs  Pulmonary: []  productive cough []  asthma/wheezing []  home O2  Neurologic: []  weakness in []  arms []  legs []  numbness in []  arms []  legs []  hx of CVA []  mini stroke [] difficulty speaking or slurred speech []  temporary loss of vision in one eye []  dizziness  Hematologic: []  hx of cancer []  bleeding problems []  problems with blood clotting easily  Endocrine:   []  diabetes []  thyroid disease  GI []  vomiting blood []  blood in stool  GU: []  CKD/renal failure []  HD--[]  M/W/F or []  T/T/S []  burning with urination []  blood in urine  Psychiatric: []  anxiety []  depression  Musculoskeletal: []  arthritis []  joint pain  Integumentary: []  rashes []  ulcers  Constitutional: []  fever []  chills   Physical Examination  Vitals:   04/04/18 0833  BP: (!) 109/57  Pulse: 80  Temp: 98.3 F (36.8 C)  SpO2: 92%   Body mass index is 37.93 kg/m.  General:  WDWN in NAD Gait: Not observed HENT: WNL, normocephalic Pulmonary: normal non-labored breathing, without Rales, rhonchi,  wheezing Cardiac: RRR Abdomen: soft, NT/ND, no masses Vascular: Left upper extremity brachiocephalic AVF.  Pulse in fistula.   Musculoskeletal: no muscle wasting or atrophy  Neurologic: A&O X 3; Appropriate Affect ; SENSATION: normal; MOTOR FUNCTION:  moving all extremities equally. Speech is fluent/normal   CBC    Component Value Date/Time   WBC 6.9 08/14/2017 0304   RBC 4.07 08/14/2017 0304   HGB 10.2 (L) 04/04/2018 0944   HCT 30.0 (L) 04/04/2018 0944   PLT 187 08/14/2017 0304   MCV 80.8 08/14/2017 0304   MCH 26.0 08/14/2017 0304   MCHC 32.2 08/14/2017 0304  RDW 14.3 08/14/2017 0304   LYMPHSABS 1.4 08/13/2017 1147   MONOABS 0.5 08/13/2017 1147   EOSABS 0.5 08/13/2017 1147   BASOSABS 0.0 08/13/2017 1147    BMET    Component Value  Date/Time   NA 134 (L) 04/04/2018 0944   K 4.0 04/04/2018 0944   CL 100 04/04/2018 0944   CO2 26 08/14/2017 0304   GLUCOSE 189 (H) 04/04/2018 0944   BUN 55 (H) 04/04/2018 0944   CREATININE 13.00 (H) 04/04/2018 0944   CALCIUM 9.8 08/14/2017 0304   GFRNONAA 6 (L) 08/14/2017 0304   GFRAA 7 (L) 08/14/2017 0304    COAGS: Lab Results  Component Value Date   INR 1.05 03/25/2011   INR 1.07 06/13/2010   INR 2.25 (H) 06/12/2010     Non-Invasive Vascular Imaging:    None    ASSESSMENT/PLAN: This is a 72 y.o. female with ESRD and on HD via left upper arm brachiocephalic AVF that presents for fistulogram given difficulty in dialysis.  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  Will evaluate today.   Marty Heck, MD [facility] Office: [phone] Pager: [place]

## 2018-04-05 ENCOUNTER — Observation Stay
Admission: AD | Admit: 2018-04-05 | Payer: Self-pay | Source: Other Acute Inpatient Hospital | Admitting: Internal Medicine

## 2018-04-05 ENCOUNTER — Other Ambulatory Visit: Payer: Self-pay | Admitting: Radiology

## 2018-04-05 ENCOUNTER — Encounter (HOSPITAL_COMMUNITY): Payer: Self-pay | Admitting: Vascular Surgery

## 2018-04-05 ENCOUNTER — Other Ambulatory Visit (HOSPITAL_COMMUNITY): Payer: Self-pay | Admitting: Nephrology

## 2018-04-05 DIAGNOSIS — N186 End stage renal disease: Secondary | ICD-10-CM

## 2018-04-06 ENCOUNTER — Encounter (HOSPITAL_COMMUNITY): Payer: Self-pay

## 2018-04-06 ENCOUNTER — Other Ambulatory Visit: Payer: Self-pay

## 2018-04-06 ENCOUNTER — Other Ambulatory Visit (HOSPITAL_COMMUNITY): Payer: Self-pay | Admitting: Nephrology

## 2018-04-06 ENCOUNTER — Ambulatory Visit (HOSPITAL_COMMUNITY)
Admission: RE | Admit: 2018-04-06 | Discharge: 2018-04-06 | Disposition: A | Discharge status: Home / Self Care | Payer: Medicare Other | Source: Ambulatory Visit | Attending: Nephrology | Admitting: Nephrology

## 2018-04-06 DIAGNOSIS — N186 End stage renal disease: Secondary | ICD-10-CM

## 2018-04-06 DIAGNOSIS — Z794 Long term (current) use of insulin: Secondary | ICD-10-CM | POA: Diagnosis not present

## 2018-04-06 DIAGNOSIS — Z9842 Cataract extraction status, left eye: Secondary | ICD-10-CM | POA: Insufficient documentation

## 2018-04-06 DIAGNOSIS — Z955 Presence of coronary angioplasty implant and graft: Secondary | ICD-10-CM | POA: Insufficient documentation

## 2018-04-06 DIAGNOSIS — Z7982 Long term (current) use of aspirin: Secondary | ICD-10-CM | POA: Insufficient documentation

## 2018-04-06 DIAGNOSIS — I447 Left bundle-branch block, unspecified: Secondary | ICD-10-CM | POA: Insufficient documentation

## 2018-04-06 DIAGNOSIS — Z9012 Acquired absence of left breast and nipple: Secondary | ICD-10-CM | POA: Diagnosis not present

## 2018-04-06 DIAGNOSIS — I132 Hypertensive heart and chronic kidney disease with heart failure and with stage 5 chronic kidney disease, or end stage renal disease: Secondary | ICD-10-CM | POA: Diagnosis not present

## 2018-04-06 DIAGNOSIS — Z90710 Acquired absence of both cervix and uterus: Secondary | ICD-10-CM | POA: Diagnosis not present

## 2018-04-06 DIAGNOSIS — Z992 Dependence on renal dialysis: Secondary | ICD-10-CM | POA: Diagnosis not present

## 2018-04-06 DIAGNOSIS — E079 Disorder of thyroid, unspecified: Secondary | ICD-10-CM | POA: Insufficient documentation

## 2018-04-06 DIAGNOSIS — Z8249 Family history of ischemic heart disease and other diseases of the circulatory system: Secondary | ICD-10-CM | POA: Diagnosis not present

## 2018-04-06 DIAGNOSIS — E1122 Type 2 diabetes mellitus with diabetic chronic kidney disease: Secondary | ICD-10-CM | POA: Diagnosis not present

## 2018-04-06 DIAGNOSIS — E785 Hyperlipidemia, unspecified: Secondary | ICD-10-CM | POA: Diagnosis not present

## 2018-04-06 DIAGNOSIS — Z87891 Personal history of nicotine dependence: Secondary | ICD-10-CM | POA: Diagnosis not present

## 2018-04-06 DIAGNOSIS — E1140 Type 2 diabetes mellitus with diabetic neuropathy, unspecified: Secondary | ICD-10-CM | POA: Insufficient documentation

## 2018-04-06 DIAGNOSIS — I509 Heart failure, unspecified: Secondary | ICD-10-CM | POA: Insufficient documentation

## 2018-04-06 DIAGNOSIS — Z853 Personal history of malignant neoplasm of breast: Secondary | ICD-10-CM | POA: Insufficient documentation

## 2018-04-06 DIAGNOSIS — M1990 Unspecified osteoarthritis, unspecified site: Secondary | ICD-10-CM | POA: Insufficient documentation

## 2018-04-06 DIAGNOSIS — Z79899 Other long term (current) drug therapy: Secondary | ICD-10-CM | POA: Diagnosis not present

## 2018-04-06 DIAGNOSIS — Z803 Family history of malignant neoplasm of breast: Secondary | ICD-10-CM | POA: Diagnosis not present

## 2018-04-06 DIAGNOSIS — Z888 Allergy status to other drugs, medicaments and biological substances status: Secondary | ICD-10-CM | POA: Diagnosis not present

## 2018-04-06 DIAGNOSIS — Z9889 Other specified postprocedural states: Secondary | ICD-10-CM | POA: Insufficient documentation

## 2018-04-06 HISTORY — PX: IR FLUORO GUIDE CV LINE RIGHT: IMG2283

## 2018-04-06 HISTORY — PX: IR US GUIDE VASC ACCESS RIGHT: IMG2390

## 2018-04-06 LAB — CBC WITH DIFFERENTIAL/PLATELET
Abs Immature Granulocytes: 0.03 10*3/uL (ref 0.00–0.07)
Basophils Absolute: 0 10*3/uL (ref 0.0–0.1)
Basophils Relative: 0 %
EOS ABS: 1.2 10*3/uL — AB (ref 0.0–0.5)
Eosinophils Relative: 12 %
HEMATOCRIT: 28.4 % — AB (ref 36.0–46.0)
HEMOGLOBIN: 8.9 g/dL — AB (ref 12.0–15.0)
Immature Granulocytes: 0 %
Lymphocytes Relative: 15 %
Lymphs Abs: 1.5 10*3/uL (ref 0.7–4.0)
MCH: 25.6 pg — AB (ref 26.0–34.0)
MCHC: 31.3 g/dL (ref 30.0–36.0)
MCV: 81.6 fL (ref 80.0–100.0)
MONOS PCT: 10 %
Monocytes Absolute: 0.9 10*3/uL (ref 0.1–1.0)
NEUTROS ABS: 6.1 10*3/uL (ref 1.7–7.7)
NEUTROS PCT: 63 %
Platelets: 194 10*3/uL (ref 150–400)
RBC: 3.48 MIL/uL — ABNORMAL LOW (ref 3.87–5.11)
RDW: 14.6 % (ref 11.5–15.5)
WBC: 9.7 10*3/uL (ref 4.0–10.5)
nRBC: 0 % (ref 0.0–0.2)

## 2018-04-06 LAB — GLUCOSE, CAPILLARY
Glucose-Capillary: 167 mg/dL — ABNORMAL HIGH (ref 70–99)
Glucose-Capillary: 157 mg/dL — ABNORMAL HIGH (ref 70–99)

## 2018-04-06 LAB — BASIC METABOLIC PANEL
Anion gap: 14 (ref 5–15)
BUN: 65 mg/dL — ABNORMAL HIGH (ref 8–23)
CHLORIDE: 100 mmol/L (ref 98–111)
CO2: 22 mmol/L (ref 22–32)
CREATININE: 14.82 mg/dL — AB (ref 0.44–1.00)
Calcium: 8.9 mg/dL (ref 8.9–10.3)
GFR calc non Af Amer: 2 mL/min — ABNORMAL LOW (ref >60)
GFR, EST AFRICAN AMERICAN: 2 mL/min — AB (ref >60)
Glucose, Bld: 176 mg/dL — ABNORMAL HIGH (ref 70–99)
POTASSIUM: 3.6 mmol/L (ref 3.5–5.1)
SODIUM: 136 mmol/L (ref 135–145)

## 2018-04-06 LAB — PROTIME-INR
INR: 1.12
Prothrombin Time: 14.3 seconds (ref 11.4–15.2)

## 2018-04-06 MED ORDER — LIDOCAINE-EPINEPHRINE (PF) 1 %-1:200000 IJ SOLN
INTRAMUSCULAR | Status: AC | PRN
  Administered 2018-04-06: 10 mL

## 2018-04-06 MED ORDER — CEFAZOLIN SODIUM-DEXTROSE 2-4 GM/100ML-% IV SOLN
INTRAVENOUS | Status: AC
  Filled 2018-04-06: qty 100

## 2018-04-06 MED ORDER — FENTANYL CITRATE (PF) 100 MCG/2ML IJ SOLN
INTRAMUSCULAR | Status: AC | PRN
  Administered 2018-04-06: 50 ug via INTRAVENOUS

## 2018-04-06 MED ORDER — MIDAZOLAM HCL 2 MG/2ML IJ SOLN
INTRAMUSCULAR | Status: AC
  Filled 2018-04-06: qty 2

## 2018-04-06 MED ORDER — CEFAZOLIN SODIUM-DEXTROSE 2-4 GM/100ML-% IV SOLN
2.0000 g | INTRAVENOUS | Status: AC
  Administered 2018-04-06: 2 g via INTRAVENOUS

## 2018-04-06 MED ORDER — MIDAZOLAM HCL 2 MG/2ML IJ SOLN
INTRAMUSCULAR | Status: AC | PRN
  Administered 2018-04-06: 1 mg via INTRAVENOUS

## 2018-04-06 MED ORDER — GELATIN ABSORBABLE 12-7 MM EX MISC
CUTANEOUS | Status: AC
  Filled 2018-04-06: qty 1

## 2018-04-06 MED ORDER — HEPARIN SODIUM (PORCINE) 1000 UNIT/ML IJ SOLN
INTRAMUSCULAR | Status: AC
  Filled 2018-04-06: qty 1

## 2018-04-06 MED ORDER — SODIUM CHLORIDE 0.9 % IV SOLN: INTRAVENOUS | Status: DC

## 2018-04-06 MED ORDER — LIDOCAINE-EPINEPHRINE (PF) 1 %-1:200000 IJ SOLN
INTRAMUSCULAR | Status: AC
  Filled 2018-04-06: qty 30

## 2018-04-06 MED ORDER — FENTANYL CITRATE (PF) 100 MCG/2ML IJ SOLN
INTRAMUSCULAR | Status: AC
  Filled 2018-04-06: qty 2

## 2018-04-06 NOTE — Procedures (Signed)
esrd  Sp RT IJ HD CATH  Tip svcra No comp Stable EBL min Ready for use

## 2018-04-06 NOTE — Discharge Instructions (Signed)
Central Line Dialysis Access Placement, Care After °This sheet gives you information about how to care for yourself after your procedure. Your health care provider may also give you more specific instructions. If you have problems or questions, contact your health care provider. °What can I expect after the procedure? °After the procedure, it is common to have: °· Mild pain or discomfort. °· Mild redness, swelling, or bruising around your incision. °· A small amount of blood or clear fluid coming from your incision. ° °Follow these instructions at home: °Incision care °· Follow instructions from your health care provider about how to take care of your incision. Make sure you: °? Wash your hands with soap and water before you change your bandage (dressing). If soap and water are not available, use hand sanitizer. °? Change your dressing as told by your health care provider. °? Leave stitches (sutures) in place. °· Check your incision area every day for signs of infection. Check for: °? More redness, swelling, or pain. °? More fluid or blood. °? Warmth. °? Pus or a bad smell. °· If directed, put heat on the catheter site as often as told by your health care provider. Use the heat source that your health care provider recommends, such as a moist heat pack or a heating pad. °? Place a towel between your skin and the heat source. °? Leave the heat on for 20-30 minutes. °? Remove the heat if your skin turns bright red. This is especially important if you are unable to feel pain, heat, or cold. You may have a greater risk of getting burned. °· If directed, put ice on the catheter site: °? Put ice in a plastic bag. °? Place a towel between your skin and the bag. °? Leave the ice on for 20 minutes, 2-3 times a day. °Medicines °· Take over-the-counter and prescription medicines only as told by your health care provider. °· If you were prescribed an antibiotic medicine, use it as told by your health care provider. Do not stop  using the antibiotic even if you start to feel better. °Activity °· Return to your normal activities as told by your health care provider. Ask your health care provider what activities are safe for you. °· Do not lift anything that is heavier than 10 lb (4.5 kg) until your health care provider says that this is safe. °Driving °· Do not drive for 24 hours if you were given a medicine to help you relax (sedative) during your procedure. °· Do not drive or use heavy machinery while taking prescription pain medicine. °Lifestyle °· Limit alcohol intake to no more than 1 drink a day for nonpregnant women and 2 drinks a day for men. One drink equals 12 oz of beer, 5 oz of wine, or 1½ oz of hard liquor. °· Do not use any products that contain nicotine or tobacco, such as cigarettes and e-cigarettes. If you need help quitting, ask your health care provider. °General instructions °· Do not take baths or showers, swim, or use a hot tub until your health care provider approves. You may only be allowed to take sponge baths for bathing. °· Wear compression stockings as told by your health care provider. These stockings help to prevent blood clots and reduce swelling in your legs. °· Follow instructions from your health care provider about eating or drinking restrictions. °· Keep all follow-up visits as told by your health care provider. This is important. °Contact a health care provider if: °· Your   catheter gets pulled out of place. °· Your catheter site becomes itchy. °· You develop a rash around your catheter site. °· You have more redness, swelling, or pain around your incision. °· You have more fluid or blood coming from your incision. °· Your incision area feels warm to the touch. °· You have pus or a bad smell coming from your incision. °· You have a fever. °Get help right away if: °· You become light-headed or dizzy. °· You faint. °· You have difficulty breathing. °· Your catheter gets pulled out completely. °This  information is not intended to replace advice given to you by your health care provider. Make sure you discuss any questions you have with your health care provider. °Document Released: 12/15/2003 Document Revised: 01/25/2016 Document Reviewed: 01/25/2016 °[employer] Interactive Patient Education © 2018 [employer] ° °

## 2018-04-06 NOTE — H&P (Signed)
Chief Complaint: Patient was seen in consultation today for tunneled dialysis catheter placement at the request of [place]  Referring Physician(s): Fran Lowes  Supervising Physician: Daryll Brod  Patient Status: [facility] - Out-pt  History of Present Illness: Alexis [name] is a 72 y.o. female   Left upper arm fistula not working Placed 08/2014 Many fistulograms per chart  Most recent intervention 04/04/18: Dr Wilmer Floor: There was a greater than 90% stenosis of the cephalic vein at the junction with theaxillaryvein as a severe outflow stenosis. After balloon angioplasty including drug-coated balloon angioplasty there was no significantresidual stenosis.  Pulling clots per pt report Last successful use Tues this week Thurs noted clots and dialysis not performed  Request for tunneled dialysis catheter placement per Dr Lowanda Foster   Past Medical History:  Diagnosis Date  . Arthritis   . Cancer [facility]) 2005    left breast  . CHF (congestive heart failure) ([place])   . Chronic back pain   . Chronic kidney disease 03/2014   dialysis t/th/sa  . Coronary artery disease   . Diabetes mellitus    Type 2  . Diabetic nephropathy ([place]) 01-08-13  . Dyslipidemia 01-08-13  . ESRD on hemodialysis ([place])    Tu, Th, Sat  . Headache   . Hyperlipidemia   . Hypertension   . LBBB (left bundle branch block)   . Proteinuria 01-08-13  . Thyroid disease 01-08-13   Hyper-parathyroidism-secondary    Past Surgical History:  Procedure Laterality Date  . A/V FISTULAGRAM N/A 08/25/2017   Procedure: A/V FISTULAGRAM - Left Arm;  Surgeon: Angelia Mould, MD;  Location: [place] [facility];  Service: Cardiovascular;  Laterality: N/A;  . A/V FISTULAGRAM N/A 02/09/2018   Procedure: A/V [id number];  Surgeon: Elam Dutch, MD;  Location: [place] [facility];  Service: Cardiovascular;  Laterality: N/A;  . A/V FISTULAGRAM Left 04/04/2018   Procedure: A/V FISTULAGRAM;  Surgeon: Marty Heck, MD;  Location: [place] [facility];  Service: Cardiovascular;  Laterality: Left;  . ABDOMINAL AORTAGRAM N/A 08/11/2014   Procedure: ABDOMINAL Maxcine Ham;  Surgeon: Angelia Mould, MD;  Location: [facility] CATH LAB;  Service: Cardiovascular;  Laterality: N/A;  . ABDOMINAL HYSTERECTOMY    . APPENDECTOMY    . Arm surgery     Left arm trauma  . AV FISTULA PLACEMENT Left 08/21/2014   Procedure: LEFT ARM ARTERIOVENOUS (AV) FISTULA CREATION ;  Surgeon: Angelia Mould, MD;  Location: [place] [facility];  Service: Vascular;  Laterality: Left;  . BACK SURGERY    . CATARACT EXTRACTION W/PHACO Left 12/22/2014   Procedure: CATARACT EXTRACTION PHACO AND INTRAOCULAR LENS PLACEMENT (IOC);  Surgeon: Tonny Branch, MD;  Location: [facility];  Service: Ophthalmology;  Laterality: Left;  CDE 13.48  . CORONARY ANGIOPLASTY  12/19/2003   inferior wall hypokinesis. ef 50%  . dialysis catheter    . MASTECTOMY     Left  . PERIPHERAL VASCULAR BALLOON ANGIOPLASTY Left 08/25/2017   Procedure: PERIPHERAL VASCULAR BALLOON ANGIOPLASTY;  Surgeon: Angelia Mould, MD;  Location: [place] [facility];  Service: Cardiovascular;  Laterality: Left;  arm fistula  . PERIPHERAL VASCULAR BALLOON ANGIOPLASTY Left 02/09/2018   Procedure: PERIPHERAL VASCULAR BALLOON ANGIOPLASTY;  Surgeon: Elam Dutch, MD;  Location: [place] [facility];  Service: Cardiovascular;  Laterality: Left;  AV Fistula  . PERIPHERAL VASCULAR BALLOON ANGIOPLASTY Left 04/04/2018   Procedure: PERIPHERAL VASCULAR BALLOON ANGIOPLASTY;  Surgeon: Marty Heck, MD;  Location: [place] [facility];  Service:  Cardiovascular;  Laterality: Left;  UPPER ARM FISTULA  . PERIPHERAL VASCULAR CATHETERIZATION N/A 09/16/2015   Procedure: Fistulagram;  Surgeon: Rosetta Posner, MD;  Location: [place] [facility];  Service: Cardiovascular;  Laterality: N/A;  . SPINE SURGERY    . TONSILLECTOMY    . TRANSTHORACIC ECHOCARDIOGRAM  10/01/2010    Left ventricle: The cavity size was  mildly dilated. Wall thickness was increased in a pattern of mild LVH. Systolic function was   mildly reduced. The estimated ejection fraction was in the range  of 45% to 50%.   . TUBAL LIGATION      Allergies: Olmesartan  Medications: Prior to Admission medications   Medication Sig Start Date End Date Taking? Authorizing Provider  aspirin EC 81 MG EC tablet Take 1 tablet (81 mg total) by mouth daily. 08/05/17  Yes Osei-Bonsu, Iona Beard, MD  atorvastatin (LIPITOR) 80 MG tablet Take 80 mg by mouth daily. 01/17/18  Yes [provider]  AURYXIA 1 GM 210 MG(Fe) tablet Take 420 mg by mouth 3 (three) times daily with meals. Take 2 tablets (420 mg) by mouth 3 times daily with meals & take 1 tablet (210 mg) by mouth once daily with a snack. 07/25/17  Yes [provider]  carvedilol (COREG) 6.25 MG tablet Take 6.25 mg by mouth 2 (two) times daily. 01/17/18  Yes [provider]  docusate sodium (COLACE) 100 MG capsule Take 100 mg by mouth every other day.   Yes [provider]  DULoxetine (CYMBALTA) 30 MG capsule Take 30 mg by mouth daily. 01/17/18  Yes [provider]  gabapentin (NEURONTIN) 100 MG capsule Take 100 mg by mouth at bedtime and may repeat dose one time if needed.  01/17/18  Yes [provider]  hydrALAZINE (APRESOLINE) 100 MG tablet Take 100 mg by mouth 3 (three) times daily.   Yes [provider]  HYDROcodone-acetaminophen (NORCO/VICODIN) 5-325 MG tablet Take 2 tablets by mouth every 6 (six) hours as needed for moderate pain or severe pain. 08/27/17  Yes Avie Echevaria B, PA-C  isosorbide mononitrate (IMDUR) 30 MG 24 hr tablet Take 30 mg by mouth daily.  08/03/15  Yes [provider]  NOVOLOG MIX 70/30 FLEXPEN (70-30) 100 UNIT/ML FlexPen Inject 30 Units into the skin daily at 6 PM. 1900 01/17/18  Yes [provider]  diclofenac sodium (VOLTAREN) 1 % GEL Apply 1 application topically daily as needed (pain.).     [provider]  lidocaine-prilocaine (EMLA) cream Apply 1 application topically daily as needed (prior to port being accessed).     [provider]  nitroGLYCERIN (NITROSTAT) 0.4 MG SL tablet Place 0.4 mg under the tongue every 5 (five) minutes as needed for chest pain.    [provider]  polyethylene glycol (MIRALAX / GLYCOLAX) packet Take 17 g by mouth daily as needed for mild constipation.     [provider]  tiZANidine (ZANAFLEX) 4 MG tablet Take 4 mg by mouth at bedtime as needed for muscle spasms. 08/22/17   [provider]     Family History  Problem Relation Age of Onset  . Breast cancer Mother        Died age 68  . Cancer Mother 15       Breast  . Heart disease Mother   . Hyperlipidemia Mother   . Hypertension Mother   . Heart attack Mother     Social History   Socioeconomic History  . Marital status: Legally Separated  Spouse name: Not on file  . Number of children: 3  . Years of education: Not on file  . Highest education level: Not on file  Occupational History  . Occupation: Retired  Scientific laboratory technician  . Financial resource strain: Not on file  . Food insecurity:    Worry: Not on file    Inability: Not on file  . Transportation needs:    Medical: Not on file    Non-medical: Not on file  Tobacco Use  . Smoking status: Former Smoker    Packs/day: 1.00    Years: 50.00    Pack years: 50.00    Types: Cigarettes    Last attempt to quit: 05/16/1998    Years since quitting: 19.9  . Smokeless tobacco: Never Used  Substance and Sexual Activity  . Alcohol use: No    Alcohol/week: 0.0 standard drinks  . Drug use: No  . Sexual activity: Yes    Birth control/protection: Post-menopausal  Lifestyle  . Physical activity:    Days per week: Not on file    Minutes per session: Not on file  . Stress: Not on file  Relationships  . Social connections:    Talks on phone: Not on file    Gets together: Not on file    Attends religious  service: Not on file    Active member of club or organization: Not on file    Attends meetings of clubs or organizations: Not on file    Relationship status: Not on file  Other Topics Concern  . Not on file  Social History Narrative   Lives at [place] alone (most of the time).  Three grandchildren and a one year old great grandchild    Review of Systems: A 12 point ROS discussed and pertinent positives are indicated in the HPI above.  All other systems are negative.  Review of Systems  Constitutional: Negative for activity change, fatigue and fever.  Respiratory: Negative for shortness of breath.   Cardiovascular: Negative for chest pain.  Gastrointestinal: Negative for abdominal pain.  Neurological: Negative for weakness.  Psychiatric/Behavioral: Negative for behavioral problems and confusion.    Vital Signs: BP (!) 144/60   Pulse 84   Temp 98.2 F (36.8 C)   Ht 5\' 6"  (1.676 m)   Wt 235 lb (106.6 kg)   SpO2 98%   BMI 37.93 kg/m   Physical Exam  Constitutional: She is oriented to person, place, and time.  Cardiovascular: Normal rate and regular rhythm.  Pulmonary/Chest: Effort normal and breath sounds normal.  Abdominal: Soft. Bowel sounds are normal.  Musculoskeletal: Normal range of motion.  Left upper arm fistula- no thrill - no pulse  Neurological: She is alert and oriented to person, place, and time.  Skin: Skin is warm and dry.  Psychiatric: She has a normal mood and affect. Her behavior is normal. Judgment and thought content normal.  Vitals reviewed.   Imaging: No results found.  Labs:  CBC: Recent Labs    08/04/17 1228 08/06/17 0330 08/13/17 1147 08/14/17 0304 08/25/17 0736 02/09/18 1042 04/04/18 0944  WBC 7.9 7.2 7.5 6.9  --   --   --   HGB 11.8* 10.1* 11.7* 10.6* 11.9* 10.9* 10.2*  HCT 36.4 30.9* 36.1 32.9* 35.0* 32.0* 30.0*  PLT 190 186 165 187  --   --   --     COAGS: No results for input(s): INR, APTT in the last 8760  hours.  BMP: Recent Labs    08/04/17  1228 08/06/17 0330 08/13/17 1147 08/14/17 0304 08/25/17 0736 02/09/18 1042 04/04/18 0944  NA 137 136 133* 137 138 135 134*  K 4.4 4.3 3.9 4.3 4.3 3.3* 4.0  CL 98* 99* 95* 97* 96* 92* 100  CO2 26 25 24 26   --   --   --   GLUCOSE 178* 106* 206* 135* 130* 180* 189*  BUN 35* 57* 26* 36* 21* 31* 55*  CALCIUM 10.0 9.6 9.7 9.8  --   --   --   CREATININE 5.37* 8.29* 5.14* 6.44* 4.60* 6.60* 13.00*  GFRNONAA 7* 4* 8* 6*  --   --   --   GFRAA 8* 5* 9* 7*  --   --   --     LIVER FUNCTION TESTS: Recent Labs    08/06/17 0330 08/14/17 0304  BILITOT  --  0.8  AST  --  15  ALT  --  8*  ALKPHOS  --  45  PROT  --  7.1  ALBUMIN 3.0* 3.2*    TUMOR MARKERS: No results for input(s): AFPTM, CEA, CA199, CHROMGRNA in the last 8760 hours.  Assessment and Plan:  ESRD Left upper arm fistula not working Last intervention 04/04/18- Dr Carlis Abbott (many prev interventions)-- this fistula placed 2016 Dr Lowanda Foster requesting tunneled dialysis catheter placement Now scheduled for same Risks and benefits discussed with the patient including, but not limited to bleeding, infection, vascular injury, pneumothorax which may require chest tube placement, air embolism or even death  All of the patient's questions were answered, patient is agreeable to proceed. Consent signed and in chart.  Thank you for this interesting consult.  I greatly enjoyed meeting Alexis [name] and look forward to participating in their care.  A copy of this report was sent to the requesting provider on this date.  Electronically Signed: Lavonia Drafts, PA-C 04/06/2018, 9:02 AM   I spent a total of  30 Minutes   in face to face in clinical consultation, greater than 50% of which was counseling/coordinating care for HD catheter placement

## 2018-04-20 ENCOUNTER — Other Ambulatory Visit (HOSPITAL_COMMUNITY): Payer: Self-pay | Admitting: Nephrology

## 2018-04-20 DIAGNOSIS — N186 End stage renal disease: Secondary | ICD-10-CM

## 2018-04-20 DIAGNOSIS — Z992 Dependence on renal dialysis: Principal | ICD-10-CM

## 2018-04-25 ENCOUNTER — Encounter: Payer: Self-pay | Admitting: Physician Assistant

## 2018-04-25 ENCOUNTER — Ambulatory Visit (HOSPITAL_COMMUNITY)
Admission: RE | Admit: 2018-04-25 | Discharge: 2018-04-25 | Disposition: A | Discharge status: Home / Self Care | Payer: Medicare Other | Source: Ambulatory Visit | Attending: Nephrology | Admitting: Nephrology

## 2018-04-25 DIAGNOSIS — Z992 Dependence on renal dialysis: Secondary | ICD-10-CM | POA: Insufficient documentation

## 2018-04-25 DIAGNOSIS — N186 End stage renal disease: Secondary | ICD-10-CM | POA: Insufficient documentation

## 2018-04-25 DIAGNOSIS — Z452 Encounter for adjustment and management of vascular access device: Secondary | ICD-10-CM | POA: Diagnosis present

## 2018-04-25 HISTORY — PX: IR REMOVAL TUN CV CATH W/O FL: IMG2289

## 2018-04-25 MED ORDER — LIDOCAINE HCL 1 % IJ SOLN
INTRAMUSCULAR | Status: DC | PRN
  Administered 2018-04-25: 5 mL

## 2018-04-25 MED ORDER — CHLORHEXIDINE GLUCONATE 4 % EX LIQD
CUTANEOUS | Status: AC
  Filled 2018-04-25: qty 15

## 2018-04-25 MED ORDER — LIDOCAINE HCL 1 % IJ SOLN
INTRAMUSCULAR | Status: AC
  Filled 2018-04-25: qty 20

## 2018-04-25 MED ORDER — CHLORHEXIDINE GLUCONATE 4 % EX LIQD
CUTANEOUS | Status: DC | PRN
  Administered 2018-04-25: 1 via TOPICAL

## 2018-04-25 NOTE — Procedures (Signed)
Pre procedural Dx: ESRD Post procedural Dx: Same  Successful removal of tunneled HD catheter  EBL: None No immediate complications.  Please see imaging section of Epic for full dictation.  Candiss Norse, PA-C

## 2018-05-17 DIAGNOSIS — R898 Other abnormal findings in specimens from other organs, systems and tissues: Secondary | ICD-10-CM | POA: Insufficient documentation

## 2018-06-07 ENCOUNTER — Other Ambulatory Visit: Payer: Self-pay

## 2018-06-07 ENCOUNTER — Emergency Department (HOSPITAL_COMMUNITY)
Admission: EM | Admit: 2018-06-07 | Discharge: 2018-06-07 | Disposition: A | Discharge status: Home / Self Care | Payer: Medicare Other | Attending: Emergency Medicine | Admitting: Emergency Medicine

## 2018-06-07 ENCOUNTER — Encounter (HOSPITAL_COMMUNITY): Payer: Self-pay | Admitting: Emergency Medicine

## 2018-06-07 DIAGNOSIS — N186 End stage renal disease: Secondary | ICD-10-CM | POA: Insufficient documentation

## 2018-06-07 DIAGNOSIS — E1122 Type 2 diabetes mellitus with diabetic chronic kidney disease: Secondary | ICD-10-CM | POA: Insufficient documentation

## 2018-06-07 DIAGNOSIS — R2232 Localized swelling, mass and lump, left upper limb: Secondary | ICD-10-CM | POA: Diagnosis present

## 2018-06-07 DIAGNOSIS — Z87891 Personal history of nicotine dependence: Secondary | ICD-10-CM | POA: Insufficient documentation

## 2018-06-07 DIAGNOSIS — Z79899 Other long term (current) drug therapy: Secondary | ICD-10-CM | POA: Diagnosis not present

## 2018-06-07 DIAGNOSIS — L03114 Cellulitis of left upper limb: Secondary | ICD-10-CM

## 2018-06-07 DIAGNOSIS — Z7982 Long term (current) use of aspirin: Secondary | ICD-10-CM | POA: Diagnosis not present

## 2018-06-07 DIAGNOSIS — I132 Hypertensive heart and chronic kidney disease with heart failure and with stage 5 chronic kidney disease, or end stage renal disease: Secondary | ICD-10-CM | POA: Diagnosis not present

## 2018-06-07 DIAGNOSIS — I509 Heart failure, unspecified: Secondary | ICD-10-CM | POA: Diagnosis not present

## 2018-06-07 DIAGNOSIS — T82898A Other specified complication of vascular prosthetic devices, implants and grafts, initial encounter: Secondary | ICD-10-CM | POA: Diagnosis not present

## 2018-06-07 DIAGNOSIS — Z992 Dependence on renal dialysis: Secondary | ICD-10-CM | POA: Insufficient documentation

## 2018-06-07 LAB — COMPREHENSIVE METABOLIC PANEL
ALBUMIN: 3.2 g/dL — AB (ref 3.5–5.0)
ALT: 7 U/L (ref 0–44)
ANION GAP: 16 — AB (ref 5–15)
AST: 14 U/L — ABNORMAL LOW (ref 15–41)
Alkaline Phosphatase: 45 U/L (ref 38–126)
BILIRUBIN TOTAL: 0.4 mg/dL (ref 0.3–1.2)
BUN: 25 mg/dL — ABNORMAL HIGH (ref 8–23)
CHLORIDE: 97 mmol/L — AB (ref 98–111)
CO2: 24 mmol/L (ref 22–32)
Calcium: 9.3 mg/dL (ref 8.9–10.3)
Creatinine, Ser: 9.22 mg/dL — ABNORMAL HIGH (ref 0.44–1.00)
GFR calc Af Amer: 4 mL/min — ABNORMAL LOW (ref >60)
GFR calc non Af Amer: 4 mL/min — ABNORMAL LOW (ref >60)
GLUCOSE: 150 mg/dL — AB (ref 70–99)
POTASSIUM: 3.7 mmol/L (ref 3.5–5.1)
Sodium: 137 mmol/L (ref 135–145)
TOTAL PROTEIN: 8.3 g/dL — AB (ref 6.5–8.1)

## 2018-06-07 LAB — CBC WITH DIFFERENTIAL/PLATELET
Abs Immature Granulocytes: 0.04 10*3/uL (ref 0.00–0.07)
BASOS ABS: 0.1 10*3/uL (ref 0.0–0.1)
BASOS PCT: 1 %
EOS ABS: 0.8 10*3/uL — AB (ref 0.0–0.5)
EOS PCT: 8 %
HEMATOCRIT: 31.1 % — AB (ref 36.0–46.0)
Hemoglobin: 9.6 g/dL — ABNORMAL LOW (ref 12.0–15.0)
IMMATURE GRANULOCYTES: 0 %
LYMPHS ABS: 1.8 10*3/uL (ref 0.7–4.0)
Lymphocytes Relative: 18 %
MCH: 25.9 pg — ABNORMAL LOW (ref 26.0–34.0)
MCHC: 30.9 g/dL (ref 30.0–36.0)
MCV: 83.8 fL (ref 80.0–100.0)
Monocytes Absolute: 0.9 10*3/uL (ref 0.1–1.0)
Monocytes Relative: 9 %
NEUTROS PCT: 64 %
NRBC: 0 % (ref 0.0–0.2)
Neutro Abs: 6.3 10*3/uL (ref 1.7–7.7)
PLATELETS: 206 10*3/uL (ref 150–400)
RBC: 3.71 MIL/uL — AB (ref 3.87–5.11)
RDW: 14.5 % (ref 11.5–15.5)
WBC: 9.9 10*3/uL (ref 4.0–10.5)

## 2018-06-07 MED ORDER — CEPHALEXIN 500 MG PO CAPS: 500.0000 mg | ORAL_CAPSULE | Freq: Four times a day (QID) | ORAL | 0 refills | Status: AC

## 2018-06-07 NOTE — ED Notes (Signed)
Vascular at bedside

## 2018-06-07 NOTE — Consult Note (Signed)
Hospital Consult    Reason for Consult:  Concern for infected left arm fistula Referring Physician:  ED MRN #:  [id number]  History of Present Illness: This is a 73 y.o. female with multiple medical comorbidities including end-stage renal disease that is currently dialyzing via left upper extremity brachiocephalic fistula.  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  She was referred to the ED this morning by her dialysis center after she presented with some redness around her left upper extremity brachiocephalic fistula.  She has a aneurysmal segment of the fistula that has been well documented on previous fistulogram's and she has a little bit of focal cellulitis around this.  She denies any fevers or chills.  She denies feeling bad.  She denies any pain around her fistula.  She denies any prolonged bleeding after cannulas were removed.  She states the fistula was used on Tuesday without any problems.  He states they did not access it today.  No profound arm swelling.  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  Past Medical History:  Diagnosis Date  . Arthritis   . Cancer [facility]) 2005    left breast  . CHF (congestive heart failure) ([place])   . Chronic back pain   . Chronic kidney disease 03/2014   dialysis t/th/sa  . Coronary artery disease   . Diabetes mellitus    Type 2  . Diabetic nephropathy ([place]) 01-08-13  . Dyslipidemia 01-08-13  . ESRD on hemodialysis ([address])    Tu, Th, Sat  . Headache   . Hyperlipidemia   . Hypertension   . LBBB (left bundle branch block)   . Proteinuria 01-08-13  . Thyroid disease 01-08-13   Hyper-parathyroidism-secondary    Past Surgical History:  Procedure Laterality Date  . A/V FISTULAGRAM N/A 08/25/2017   Procedure: A/V FISTULAGRAM - Left Arm;  Surgeon: Angelia Mould, MD;  Location: [place] [facility];  Service:  Cardiovascular;  Laterality: N/A;  . A/V FISTULAGRAM N/A 02/09/2018   Procedure: A/V SFSELTRVUYE;  Surgeon: Elam Dutch, MD;  Location: [place] [facility];  Service: Cardiovascular;  Laterality: N/A;  . A/V FISTULAGRAM Left 04/04/2018   Procedure: A/V FISTULAGRAM;  Surgeon: Marty Heck, MD;  Location: [place] [facility];  Service: Cardiovascular;  Laterality: Left;  . ABDOMINAL AORTAGRAM N/A 08/11/2014   Procedure: ABDOMINAL Maxcine Ham;  Surgeon: Angelia Mould, MD;  Location: [facility] CATH LAB;  Service: Cardiovascular;  Laterality: N/A;  . ABDOMINAL HYSTERECTOMY    . APPENDECTOMY    . Arm surgery     Left arm trauma  . AV FISTULA PLACEMENT Left 08/21/2014   Procedure: LEFT ARM ARTERIOVENOUS (AV) FISTULA CREATION ;  Surgeon: Angelia Mould, MD;  Location: [place];  Service: Vascular;  Laterality: Left;  . BACK SURGERY    . CATARACT EXTRACTION W/PHACO Left 12/22/2014   Procedure: CATARACT EXTRACTION PHACO AND INTRAOCULAR LENS PLACEMENT (IOC);  Surgeon: Tonny Branch, MD;  Location: [facility];  Service: Ophthalmology;  Laterality: Left;  CDE 13.48  . CORONARY ANGIOPLASTY  12/19/2003   inferior wall hypokinesis. ef 50%  . dialysis catheter    . IR FLUORO GUIDE CV LINE RIGHT  04/06/2018  . IR REMOVAL TUN CV CATH W/O FL  04/25/2018  . IR US GUIDE VASC ACCESS RIGHT  04/06/2018  . MASTECTOMY     Left  .  PERIPHERAL VASCULAR BALLOON ANGIOPLASTY Left 08/25/2017   Procedure: PERIPHERAL VASCULAR BALLOON ANGIOPLASTY;  Surgeon: Angelia Mould, MD;  Location: [place] [facility];  Service: Cardiovascular;  Laterality: Left;  arm fistula  . PERIPHERAL VASCULAR BALLOON ANGIOPLASTY Left 02/09/2018   Procedure: PERIPHERAL VASCULAR BALLOON ANGIOPLASTY;  Surgeon: Elam Dutch, MD;  Location: [place] [facility];  Service: Cardiovascular;  Laterality: Left;  AV Fistula  . PERIPHERAL VASCULAR BALLOON ANGIOPLASTY Left 04/04/2018   Procedure: PERIPHERAL VASCULAR BALLOON ANGIOPLASTY;  Surgeon:  Marty Heck, MD;  Location: [place] [facility];  Service: Cardiovascular;  Laterality: Left;  UPPER ARM FISTULA  . PERIPHERAL VASCULAR CATHETERIZATION N/A 09/16/2015   Procedure: Fistulagram;  Surgeon: Rosetta Posner, MD;  Location: [place] [facility];  Service: Cardiovascular;  Laterality: N/A;  . SPINE SURGERY    . TONSILLECTOMY    . TRANSTHORACIC ECHOCARDIOGRAM  10/01/2010    Left ventricle: The cavity size was mildly dilated. Wall thickness was increased in a pattern of mild LVH. Systolic function was   mildly reduced. The estimated ejection fraction was in the range  of 45% to 50%.   . TUBAL LIGATION      Allergies  Allergen Reactions  . Olmesartan Other (See Comments)    Hyperkalemia Hyperkalemia    Prior to Admission medications   Medication Sig Start Date End Date Taking? Authorizing Provider  aspirin EC 81 MG EC tablet Take 1 tablet (81 mg total) by mouth daily. 08/05/17  Yes Osei-Bonsu, Iona Beard, MD  AURYXIA 1 GM 210 MG(Fe) tablet Take 420 mg by mouth 3 (three) times daily with meals. Take 2 tablets (420 mg) by mouth 3 times daily with meals & take 1 tablet (210 mg) by mouth once daily with a snack. 07/25/17  Yes [provider]  calcitRIOL (ROCALTROL) 0.25 MCG capsule Take 0.25 mcg by mouth 3 (three) times a week. On Dialysis days Harrell Lark, Sat. 03/26/14  Yes [provider]  carvedilol (COREG) 6.25 MG tablet Take 6.25 mg by mouth 2 (two) times daily. 01/17/18  Yes [provider]  diclofenac sodium (VOLTAREN) 1 % GEL Apply 1 application topically daily as needed (pain.).    Yes [provider]  docusate sodium (COLACE) 100 MG capsule Take 100 mg by mouth every other day.   Yes [provider]  DULoxetine (CYMBALTA) 30 MG capsule Take 30 mg by mouth daily. 01/17/18  Yes [provider]  gabapentin (NEURONTIN) 100 MG capsule Take 100 mg by mouth at bedtime and may repeat dose one time if needed.  01/17/18  Yes [provider]  hydrALAZINE (APRESOLINE) 100 MG tablet Take 100 mg by mouth 3 (three) times daily.   Yes [provider]  lidocaine-prilocaine (EMLA) cream Apply 1 application topically daily as needed (prior to port being accessed).    Yes [provider]  nitroGLYCERIN (NITROSTAT) 0.4 MG SL tablet Place 0.4 mg under the tongue every 5 (five) minutes as needed for chest pain.   Yes [provider]  NOVOLOG MIX 70/30 FLEXPEN (70-30) 100 UNIT/ML FlexPen Inject 30 Units into the skin daily at 6 PM. 1900 01/17/18  Yes [provider]  polyethylene glycol (MIRALAX / GLYCOLAX) packet Take 17 g by mouth daily as needed for mild constipation.    Yes [provider]  tiZANidine (ZANAFLEX) 4 MG tablet Take 4 mg by mouth at bedtime as needed for muscle spasms. 08/22/17  Yes [provider]  HYDROcodone-acetaminophen (NORCO/VICODIN) 5-325 MG  tablet Take 2 tablets by mouth every 6 (six) hours as needed for moderate pain or severe pain. Patient not taking: Reported on 06/07/2018 08/27/17   Dossie Der    Social History   Socioeconomic History  . Marital status: Legally Separated    Spouse name: Not on file  . Number of children: 3  . Years of education: Not on file  . Highest education level: Not on file  Occupational History  . Occupation: Retired  Scientific laboratory technician  . Financial resource strain: Not on file  . Food insecurity:    Worry: Not on file    Inability: Not on file  . Transportation needs:    Medical: Not on file    Non-medical: Not on file  Tobacco Use  . Smoking status: Former Smoker    Packs/day: 1.00    Years: 50.00    Pack years: 50.00    Types: Cigarettes    Last attempt to quit: 05/16/1998    Years since quitting: 20.0  . Smokeless tobacco: Never Used  Substance and Sexual Activity  . Alcohol use: No    Alcohol/week: 0.0 standard drinks  . Drug use: No  . Sexual activity: Yes    Birth control/protection: Post-menopausal    Lifestyle  . Physical activity:    Days per week: Not on file    Minutes per session: Not on file  . Stress: Not on file  Relationships  . Social connections:    Talks on phone: Not on file    Gets together: Not on file    Attends religious service: Not on file    Active member of club or organization: Not on file    Attends meetings of clubs or organizations: Not on file    Relationship status: Not on file  . Intimate partner violence:    Fear of current or ex partner: Not on file    Emotionally abused: Not on file    Physically abused: Not on file    Forced sexual activity: Not on file  Other Topics Concern  . Not on file  Social History Narrative   Lives at [place] alone (most of the time).  Three grandchildren and a one year old great grandchild     Family History  Problem Relation Age of Onset  . Breast cancer Mother        Died age 70  . Cancer Mother 50       Breast  . Heart disease Mother   . Hyperlipidemia Mother   . Hypertension Mother   . Heart attack Mother     ROS: [x]  Positive   [ ]  Negative   [ ]  All sytems reviewed and are negative  Cardiovascular: []  chest pain/pressure []  palpitations []  SOB lying flat []  DOE []  pain in legs while walking []  pain in legs at rest []  pain in legs at night []  non-healing ulcers []  hx of DVT []  swelling in legs  Pulmonary: []  productive cough []  asthma/wheezing []  home O2  Neurologic: []  weakness in []  arms []  legs []  numbness in []  arms []  legs []  hx of CVA []  mini stroke [] difficulty speaking or slurred speech []  temporary loss of vision in one eye []  dizziness  Hematologic: []  hx of cancer []  bleeding problems []  problems with blood clotting easily  Endocrine:   []  diabetes []  thyroid disease  GI []  vomiting blood []  blood in stool  GU: []  CKD/renal failure []  HD--[]  M/W/F or []   T/T/S []  burning with urination []  blood in urine  Psychiatric: []  anxiety []   depression  Musculoskeletal: []  arthritis []  joint pain  Integumentary: []  rashes []  ulcers  Constitutional: []  fever []  chills   Physical Examination  Vitals:   06/07/18 1015 06/07/18 1030  BP: 136/60 (!) 144/61  Pulse:    Resp:    Temp:    SpO2:     Body mass index is 38.74 kg/m.  General:  WDWN in NAD HENT: WNL, normocephalic Pulmonary: normal non-labored breathing, without Rales, rhonchi,  wheezing Cardiac: regular, without  Murmurs, rubs or gallops Abdomen:soft, NT/ND, no masses Vascular Exam/Pulses: Left upper arm brachiocephalic AVF with aneurysmal segment in mid upper arm, some faint blanching erythema, no pain, no tenderness, good thrill in fistula, skin intact Left radial pulse palpabke Extremities: without ischemic changes, without Gangrene , without cellulitis; without open wounds;  Musculoskeletal: no muscle wasting or atrophy  Neurologic: A&O X 3; Appropriate Affect ; SENSATION: normal; MOTOR FUNCTION:  moving all extremities equally. Speech is fluent/normal   CBC    Component Value Date/Time   WBC 9.9 06/07/2018 1056   RBC 3.71 (L) 06/07/2018 1056   HGB 9.6 (L) 06/07/2018 1056   HCT 31.1 (L) 06/07/2018 1056   PLT 206 06/07/2018 1056   MCV 83.8 06/07/2018 1056   MCH 25.9 (L) 06/07/2018 1056   MCHC 30.9 06/07/2018 1056   RDW 14.5 06/07/2018 1056   LYMPHSABS 1.8 06/07/2018 1056   MONOABS 0.9 06/07/2018 1056   EOSABS 0.8 (H) 06/07/2018 1056   BASOSABS 0.1 06/07/2018 1056    BMET    Component Value Date/Time   NA 137 06/07/2018 1056   K 3.7 06/07/2018 1056   CL 97 (L) 06/07/2018 1056   CO2 24 06/07/2018 1056   GLUCOSE 150 (H) 06/07/2018 1056   BUN 25 (H) 06/07/2018 1056   CREATININE 9.22 (H) 06/07/2018 1056   CALCIUM 9.3 06/07/2018 1056   GFRNONAA 4 (L) 06/07/2018 1056   GFRAA 4 (L) 06/07/2018 1056    COAGS: Lab Results  Component Value Date   INR 1.12 04/06/2018   INR 1.05 03/25/2011   INR 1.07 06/13/2010     Non-Invasive  Vascular Imaging:    None   ASSESSMENT/PLAN: This is a 73 y.o. female that presents to the ED with concern for infection of her left upper arm brachiocephalic fistula (referred by dialysis center).  In reviewing the records and confirming with the patient this is an autogenous vein fistula using her own cephalic vein.  She has a little bit of discrete cellulitis on exam but nothing profound.  She denies any pain around the fistula, has a normal white count, and has been afebrile.  I think it would be okay for dialysis center to access her fistula for dialysis and she states this has been working fine and has a good thrill on exam.  We will recommend 7 days of Keflex at discharge.  We can arrange follow-up next week in clinic just to confirm that things are continuing to show improvement.  Given the fact that she has required multiple fistulogram's with intervention I confirmed that there have been no issues with flow volumes or access to the fistula and patient denies any problems that would warrant fistulogram at this time.  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  As a result I do not see any immediate role for revision today.  Marty Heck, MD [facility] Office: [phone] Pager: [place]

## 2018-06-07 NOTE — ED Triage Notes (Signed)
Pt in from dialysis center, sent by Nephrologist for L arm swelling and fistula not working. States last dialysis Tuesday, L arm redness and swelling x 3 wks. Denies fevers or sob

## 2018-06-07 NOTE — ED Provider Notes (Signed)
[facility] DEPARTMENT Provider Note   CSN: [id number] Arrival date & time: 06/07/18  8101     History   Chief Complaint Chief Complaint  Patient presents with  . Fistula Problem  . Arm Swelling    HPI Alexis [name] is a 73 y.o. female.  The history is provided by the patient, the spouse and medical records. No language interpreter was used.  Rash  Location:  Shoulder/arm Shoulder/arm rash location:  R upper arm Quality: redness and swelling   Quality: not burning, not itchy, not painful and not weeping   Severity:  Mild Onset quality:  Gradual Duration:  2 days Timing:  Constant Progression:  Unchanged Chronicity:  New Relieved by:  Nothing Worsened by:  Nothing Ineffective treatments:  None tried Associated symptoms: no abdominal pain, no diarrhea, no fatigue, no fever, no headaches, no nausea, no shortness of breath, no URI, not vomiting and not wheezing     Past Medical History:  Diagnosis Date  . Arthritis   . Cancer [facility]) 2005    left breast  . CHF (congestive heart failure) ([place])   . Chronic back pain   . Chronic kidney disease 03/2014   dialysis t/th/sa  . Coronary artery disease   . Diabetes mellitus    Type 2  . Diabetic nephropathy ([place]) 01-08-13  . Dyslipidemia 01-08-13  . ESRD on hemodialysis ([place])    Tu, Th, Sat  . Headache   . Hyperlipidemia   . Hypertension   . LBBB (left bundle branch block)   . Proteinuria 01-08-13  . Thyroid disease 01-08-13   Hyper-parathyroidism-secondary    Patient Active Problem List   Diagnosis Date Noted  . Elevated troponin   . ESRD on hemodialysis ([place])   . ESRD (end stage renal disease) ([place]) 08/13/2017  . Dyspnea 08/13/2017  . Chest pain 08/04/2017  . 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 Essential hypertension 08/15/2012  . CAD (coronary artery disease) 03/25/2011  . Type II diabetes mellitus with manifestations ([place]) 03/25/2011  .  Chest pain 03/25/2011  . CHF (congestive heart failure) ([place]) 10/14/2010  . CKD (chronic kidney disease) 10/14/2010  . Obesity 10/14/2010    Past Surgical History:  Procedure Laterality Date  . A/V FISTULAGRAM N/A 08/25/2017   Procedure: A/V FISTULAGRAM - Left Arm;  Surgeon: Angelia Mould, MD;  Location: [facility] CV LAB;  Service: Cardiovascular;  Laterality: N/A;  . A/V FISTULAGRAM N/A 02/09/2018   Procedure: A/V BPZWCHENIDP;  Surgeon: Elam Dutch, MD;  Location: [facility];  Service: Cardiovascular;  Laterality: N/A;  . A/V FISTULAGRAM Left 04/04/2018   Procedure: A/V FISTULAGRAM;  Surgeon: Marty Heck, MD;  Location: [place] [facility];  Service: Cardiovascular;  Laterality: Left;  . ABDOMINAL AORTAGRAM N/A 08/11/2014   Procedure: ABDOMINAL Maxcine Ham;  Surgeon: Angelia Mould, MD;  Location: [facility] CATH LAB;  Service: Cardiovascular;  Laterality: N/A;  . ABDOMINAL HYSTERECTOMY    . APPENDECTOMY    . Arm surgery     Left arm trauma  . AV FISTULA PLACEMENT Left 08/21/2014   Procedure: LEFT ARM ARTERIOVENOUS (AV) FISTULA CREATION ;  Surgeon: Angelia Mould, MD;  Location: [facility];  Service: Vascular;  Laterality: Left;  . BACK SURGERY    . CATARACT EXTRACTION W/PHACO Left 12/22/2014   Procedure: CATARACT EXTRACTION PHACO AND INTRAOCULAR LENS PLACEMENT (IOC);  Surgeon: Tonny Branch, MD;  Location: [facility];  Service: Ophthalmology;  Laterality:  Left;  CDE 13.48  . CORONARY ANGIOPLASTY  12/19/2003   inferior wall hypokinesis. ef 50%  . dialysis catheter    . IR FLUORO GUIDE CV LINE RIGHT  04/06/2018  . IR REMOVAL TUN CV CATH W/O FL  04/25/2018  . IR US GUIDE VASC ACCESS RIGHT  04/06/2018  . MASTECTOMY     Left  . PERIPHERAL VASCULAR BALLOON ANGIOPLASTY Left 08/25/2017   Procedure: PERIPHERAL VASCULAR BALLOON ANGIOPLASTY;  Surgeon: Angelia Mould, MD;  Location: [facility];  Service: Cardiovascular;  Laterality: Left;  arm fistula  .  PERIPHERAL VASCULAR BALLOON ANGIOPLASTY Left 02/09/2018   Procedure: PERIPHERAL VASCULAR BALLOON ANGIOPLASTY;  Surgeon: Elam Dutch, MD;  Location: [facility];  Service: Cardiovascular;  Laterality: Left;  AV Fistula  . PERIPHERAL VASCULAR BALLOON ANGIOPLASTY Left 04/04/2018   Procedure: PERIPHERAL VASCULAR BALLOON ANGIOPLASTY;  Surgeon: Marty Heck, MD;  Location: [facility];  Service: Cardiovascular;  Laterality: Left;  UPPER ARM FISTULA  . PERIPHERAL VASCULAR CATHETERIZATION N/A 09/16/2015   Procedure: Fistulagram;  Surgeon: Rosetta Posner, MD;  Location: [facility];  Service: Cardiovascular;  Laterality: N/A;  . SPINE SURGERY    . TONSILLECTOMY    . TRANSTHORACIC ECHOCARDIOGRAM  10/01/2010    Left ventricle: The cavity size was mildly dilated. Wall thickness was increased in a pattern of mild LVH. Systolic function was   mildly reduced. The estimated ejection fraction was in the range  of 45% to 50%.   . TUBAL LIGATION       OB History   No obstetric history on file.      Home Medications    Prior to Admission medications   Medication Sig Start Date End Date Taking? Authorizing Provider  aspirin EC 81 MG EC tablet Take 1 tablet (81 mg total) by mouth daily. 08/05/17   Benito Mccreedy, MD  atorvastatin (LIPITOR) 80 MG tablet Take 80 mg by mouth daily. 01/17/18   [provider]  AURYXIA 1 GM 210 MG(Fe) tablet Take 420 mg by mouth 3 (three) times daily with meals. Take 2 tablets (420 mg) by mouth 3 times daily with meals & take 1 tablet (210 mg) by mouth once daily with a snack. 07/25/17   [provider]  carvedilol (COREG) 6.25 MG tablet Take 6.25 mg by mouth 2 (two) times daily. 01/17/18   [provider]  diclofenac sodium (VOLTAREN) 1 % GEL Apply 1 application topically daily as needed (pain.).     [provider]  docusate sodium (COLACE) 100 MG capsule Take 100 mg by mouth every other day.    [provider]  DULoxetine (CYMBALTA) 30 MG capsule Take 30 mg by mouth daily. 01/17/18   [provider]  gabapentin (NEURONTIN) 100 MG capsule Take 100 mg by mouth at bedtime and may repeat dose one time if needed.  01/17/18   [provider]  hydrALAZINE (APRESOLINE) 100 MG tablet Take 100 mg by mouth 3 (three) times daily.    [provider]  HYDROcodone-acetaminophen (NORCO/VICODIN) 5-325 MG tablet Take 2 tablets by mouth every 6 (six) hours as needed for moderate pain or severe pain. 08/27/17   Avie Echevaria B, PA-C  isosorbide mononitrate (IMDUR) 30 MG 24 hr tablet Take 30 mg by mouth daily.  08/03/15   [provider]  lidocaine-prilocaine (EMLA) cream Apply 1 application topically daily as needed (prior to port being accessed).     [provider]  nitroGLYCERIN (NITROSTAT) 0.4 MG SL tablet Place 0.4 mg under the tongue every 5 (five) minutes as needed for chest pain.    [provider]  NOVOLOG MIX 70/30 FLEXPEN (70-30) 100 UNIT/ML FlexPen Inject 30 Units into the skin daily at 6 PM. 1900 01/17/18   [provider]  polyethylene glycol (MIRALAX / GLYCOLAX) packet Take 17 g by mouth daily as needed for mild constipation.     [provider]  tiZANidine (ZANAFLEX) 4 MG tablet Take 4 mg by mouth at bedtime as needed for muscle spasms. 08/22/17   [provider]    Family History Family History  Problem Relation Age of Onset  . Breast cancer Mother        Died age 92  . Cancer Mother 50       Breast  . Heart disease Mother   . Hyperlipidemia Mother   . Hypertension Mother   . Heart attack Mother     Social History Social History   Tobacco Use  . Smoking status: Former Smoker    Packs/day: 1.00    Years: 50.00    Pack years: 50.00    Types: Cigarettes    Last attempt to quit: 05/16/1998    Years since quitting: 20.0  . Smokeless tobacco: Never Used  Substance Use Topics  . Alcohol use: No    Alcohol/week: 0.0  standard drinks  . Drug use: No     Allergies   Olmesartan   Review of Systems Review of Systems  Constitutional: Negative for chills, diaphoresis, fatigue and fever.  HENT: Negative for congestion and rhinorrhea.   Respiratory: Negative for shortness of breath and wheezing.   Gastrointestinal: Negative for abdominal pain, constipation, diarrhea, nausea and vomiting.  Genitourinary: Negative for flank pain.  Musculoskeletal: Negative for back pain, neck pain and neck stiffness.  Skin: Positive for rash. Negative for wound.  Neurological: Negative for light-headedness, numbness and headaches.  Psychiatric/Behavioral: Negative for agitation.  All other systems reviewed and are negative.    Physical Exam Updated Vital Signs BP 136/65 (BP Location: Right Arm)   Pulse 88   Temp 98.3 F (36.8 C) (Oral)   Resp 16   Wt 108.9 kg   SpO2 95%   BMI 38.74 kg/m   Physical Exam Vitals signs and nursing note reviewed.  Constitutional:      General: She is not in acute distress.    Appearance: She is well-developed. She is obese. She is not ill-appearing, toxic-appearing or diaphoretic.  HENT:     Head: Normocephalic and atraumatic.     Right Ear: External ear normal.     Left Ear: External ear normal.     Nose: Nose normal.     Mouth/Throat:     Pharynx: No oropharyngeal exudate.  Eyes:     Conjunctiva/sclera: Conjunctivae normal.     Pupils: Pupils are equal, round, and reactive to light.  Neck:     Musculoskeletal: Normal range of motion and neck supple.  Cardiovascular:     Rate and Rhythm: Normal rate.     Pulses: Normal pulses.     Heart sounds: No murmur.  Pulmonary:     Effort: No respiratory distress.     Breath sounds: No stridor. No wheezing, rhonchi or rales.  Chest:     Chest wall: No tenderness.  Abdominal:     General: There is no distension.     Tenderness: There is no abdominal tenderness. There is no rebound.  Musculoskeletal:  General:  Swelling present. No tenderness.     Left upper arm: She exhibits swelling.       Arms:     Right lower leg: No edema.     Left lower leg: No edema.  Skin:    General: Skin is warm.     Capillary Refill: Capillary refill takes less than 2 seconds.     Coloration: Skin is not pale.     Findings: Erythema present. No rash.  Neurological:     General: No focal deficit present.     Mental Status: She is alert and oriented to person, place, and time.     Motor: No abnormal muscle tone.     Coordination: Coordination normal.     Deep Tendon Reflexes: Reflexes are normal and symmetric.      ED Treatments / Results  Labs (all labs ordered are listed, but only abnormal results are displayed) Labs Reviewed  CBC WITH DIFFERENTIAL/PLATELET - Abnormal; Notable for the following components:      Result Value   RBC 3.71 (*)    Hemoglobin 9.6 (*)    HCT 31.1 (*)    MCH 25.9 (*)    Eosinophils Absolute 0.8 (*)    All other components within normal limits  COMPREHENSIVE METABOLIC PANEL - Abnormal; Notable for the following components:   Chloride 97 (*)    Glucose, Bld 150 (*)    BUN 25 (*)    Creatinine, Ser 9.22 (*)    Total Protein 8.3 (*)    Albumin 3.2 (*)    AST 14 (*)    GFR calc non Af Amer 4 (*)    GFR calc Af Amer 4 (*)    Anion gap 16 (*)    All other components within normal limits    EKG None  Radiology No results found.  Procedures Procedures (including critical care time)  Medications Ordered in ED Medications - No data to display   Initial Impression / Assessment and Plan / ED Course  I have reviewed the triage vital signs and the nursing notes.  Pertinent labs & imaging results that were available during my care of the patient were reviewed by me and considered in my medical decision making (see chart for details).     Alexis [name] is a 73 y.o. female with a past medical history significant for ESRD on dialysis Tuesday, Thursday, Saturday, CAD,  diabetes, CHF, and obesity who presents from her dialysis center for concern for abnormality with her left arm fistula.  Patient reports that she has had problems with her left arm fistula in the past requiring balloon angioplasty several months ago.  She says this does not feel like that is there is no pain however there is some mild erythema over the left upper arm as well as some swelling.  Patient denies significant pain, fevers, chills, or other complaints.  She went to her dialysis center today and when I saw her arm they sent her to the emergency department for evaluation and rule out of infection.  They did not use the fistula today and it dialyze normally 2 days ago.  Patient denies other complaints and has no other problems.  On exam, patient's left upper arm has an area of erythema near the site of her fistula.  Patient had a good thrill and a stable appearing swelling in the left upper arm.  Due to concern for possible infection or fistula problem, vascular surgery was called.  Screening laboratory  testing was ordered at the recommendation.  Labs were reassuring with no evidence of significant systemic infection.  Dr. Carlis Abbott with vascular surgery came and evaluate the patient.  They suspect she has a mild cellulitis and will be stable for discharge home with antibiotics.  They did not feel there was a problem with her fistula as there is a good thrill and good pulse distally.  Normal grip strength and sensation.  They feel they can follow-up with her in clinic and patient understood return precautions.  Patient given prescription for antibiotics for cellulitis and was discharged in good condition.  Final Clinical Impressions(s) / ED Diagnoses   Final diagnoses:  Cellulitis of left upper extremity    ED Discharge Orders         Ordered    cephALEXin (KEFLEX) 500 MG capsule  4 times daily     06/07/18 1251          Clinical Impression: 1. Cellulitis of left upper extremity      Disposition: Admit  This note was prepared with assistance of Dragon voice recognition software. Occasional wrong-word or sound-a-like substitutions may have occurred due to the inherent limitations of voice recognition software.     Tegeler, Gwenyth Allegra, MD 06/07/18 2012

## 2018-06-07 NOTE — H&P (View-Only) (Signed)
Hospital Consult    Reason for Consult:  Concern for infected left arm fistula Referring Physician:  ED MRN #:  [id number]  History of Present Illness: This is a 73 y.o. female with multiple medical comorbidities including end-stage renal disease that is currently dialyzing via left upper extremity brachiocephalic fistula.  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  She was referred to the ED this morning by her dialysis center after she presented with some redness around her left upper extremity brachiocephalic fistula.  She has a aneurysmal segment of the fistula that has been well documented on previous fistulogram's and she has a little bit of focal cellulitis around this.  She denies any fevers or chills.  She denies feeling bad.  She denies any pain around her fistula.  She denies any prolonged bleeding after cannulas were removed.  She states the fistula was used on Tuesday without any problems.  He states they did not access it today.  No profound arm swelling.  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  Past Medical History:  Diagnosis Date  . Arthritis   . Cancer [facility]) 2005    left breast  . CHF (congestive heart failure) ([place])   . Chronic back pain   . Chronic kidney disease 03/2014   dialysis t/th/sa  . Coronary artery disease   . Diabetes mellitus    Type 2  . Diabetic nephropathy ([place]) 01-08-13  . Dyslipidemia 01-08-13  . ESRD on hemodialysis ([place])    Tu, Th, Sat  . Headache   . Hyperlipidemia   . Hypertension   . LBBB (left bundle branch block)   . Proteinuria 01-08-13  . Thyroid disease 01-08-13   Hyper-parathyroidism-secondary    Past Surgical History:  Procedure Laterality Date  . A/V FISTULAGRAM N/A 08/25/2017   Procedure: A/V FISTULAGRAM - Left Arm;  Surgeon: Angelia Mould, MD;  Location: [place] [facility];  Service:  Cardiovascular;  Laterality: N/A;  . A/V FISTULAGRAM N/A 02/09/2018   Procedure: A/V ZHYQMVHQION;  Surgeon: Elam Dutch, MD;  Location: [place] [facility];  Service: Cardiovascular;  Laterality: N/A;  . A/V FISTULAGRAM Left 04/04/2018   Procedure: A/V FISTULAGRAM;  Surgeon: Marty Heck, MD;  Location: [place] [facility];  Service: Cardiovascular;  Laterality: Left;  . ABDOMINAL AORTAGRAM N/A 08/11/2014   Procedure: ABDOMINAL Maxcine Ham;  Surgeon: Angelia Mould, MD;  Location: [facility];  Service: Cardiovascular;  Laterality: N/A;  . ABDOMINAL HYSTERECTOMY    . APPENDECTOMY    . Arm surgery     Left arm trauma  . AV FISTULA PLACEMENT Left 08/21/2014   Procedure: LEFT ARM ARTERIOVENOUS (AV) FISTULA CREATION ;  Surgeon: Angelia Mould, MD;  Location: [place];  Service: Vascular;  Laterality: Left;  . BACK SURGERY    . CATARACT EXTRACTION W/PHACO Left 12/22/2014   Procedure: CATARACT EXTRACTION PHACO AND INTRAOCULAR LENS PLACEMENT (IOC);  Surgeon: Tonny Branch, MD;  Location: [facility];  Service: Ophthalmology;  Laterality: Left;  CDE 13.48  . CORONARY ANGIOPLASTY  12/19/2003   inferior wall hypokinesis. ef 50%  . dialysis catheter    . IR FLUORO GUIDE CV LINE RIGHT  04/06/2018  . IR REMOVAL TUN CV CATH W/O FL  04/25/2018  . IR US GUIDE VASC ACCESS RIGHT  04/06/2018  . MASTECTOMY     Left  .  PERIPHERAL VASCULAR BALLOON ANGIOPLASTY Left 08/25/2017   Procedure: PERIPHERAL VASCULAR BALLOON ANGIOPLASTY;  Surgeon: Angelia Mould, MD;  Location: [place] [facility];  Service: Cardiovascular;  Laterality: Left;  arm fistula  . PERIPHERAL VASCULAR BALLOON ANGIOPLASTY Left 02/09/2018   Procedure: PERIPHERAL VASCULAR BALLOON ANGIOPLASTY;  Surgeon: Elam Dutch, MD;  Location: [place] [facility];  Service: Cardiovascular;  Laterality: Left;  AV Fistula  . PERIPHERAL VASCULAR BALLOON ANGIOPLASTY Left 04/04/2018   Procedure: PERIPHERAL VASCULAR BALLOON ANGIOPLASTY;  Surgeon:  Marty Heck, MD;  Location: [place] [facility];  Service: Cardiovascular;  Laterality: Left;  UPPER ARM FISTULA  . PERIPHERAL VASCULAR CATHETERIZATION N/A 09/16/2015   Procedure: Fistulagram;  Surgeon: Rosetta Posner, MD;  Location: [place] [facility];  Service: Cardiovascular;  Laterality: N/A;  . SPINE SURGERY    . TONSILLECTOMY    . TRANSTHORACIC ECHOCARDIOGRAM  10/01/2010    Left ventricle: The cavity size was mildly dilated. Wall thickness was increased in a pattern of mild LVH. Systolic function was   mildly reduced. The estimated ejection fraction was in the range  of 45% to 50%.   . TUBAL LIGATION      Allergies  Allergen Reactions  . Olmesartan Other (See Comments)    Hyperkalemia Hyperkalemia    Prior to Admission medications   Medication Sig Start Date End Date Taking? Authorizing Provider  aspirin EC 81 MG EC tablet Take 1 tablet (81 mg total) by mouth daily. 08/05/17  Yes Osei-Bonsu, Iona Beard, MD  AURYXIA 1 GM 210 MG(Fe) tablet Take 420 mg by mouth 3 (three) times daily with meals. Take 2 tablets (420 mg) by mouth 3 times daily with meals & take 1 tablet (210 mg) by mouth once daily with a snack. 07/25/17  Yes [provider]  calcitRIOL (ROCALTROL) 0.25 MCG capsule Take 0.25 mcg by mouth 3 (three) times a week. On Dialysis days Harrell Lark, Sat. 03/26/14  Yes [provider]  carvedilol (COREG) 6.25 MG tablet Take 6.25 mg by mouth 2 (two) times daily. 01/17/18  Yes [provider]  diclofenac sodium (VOLTAREN) 1 % GEL Apply 1 application topically daily as needed (pain.).    Yes [provider]  docusate sodium (COLACE) 100 MG capsule Take 100 mg by mouth every other day.   Yes [provider]  DULoxetine (CYMBALTA) 30 MG capsule Take 30 mg by mouth daily. 01/17/18  Yes [provider]  gabapentin (NEURONTIN) 100 MG capsule Take 100 mg by mouth at bedtime and may repeat dose one time if needed.  01/17/18  Yes [provider]  hydrALAZINE (APRESOLINE) 100 MG tablet Take 100 mg by mouth 3 (three) times daily.   Yes [provider]  lidocaine-prilocaine (EMLA) cream Apply 1 application topically daily as needed (prior to port being accessed).    Yes [provider]  nitroGLYCERIN (NITROSTAT) 0.4 MG SL tablet Place 0.4 mg under the tongue every 5 (five) minutes as needed for chest pain.   Yes [provider]  NOVOLOG MIX 70/30 FLEXPEN (70-30) 100 UNIT/ML FlexPen Inject 30 Units into the skin daily at 6 PM. 1900 01/17/18  Yes [provider]  polyethylene glycol (MIRALAX / GLYCOLAX) packet Take 17 g by mouth daily as needed for mild constipation.    Yes [provider]  tiZANidine (ZANAFLEX) 4 MG tablet Take 4 mg by mouth at bedtime as needed for muscle spasms. 08/22/17  Yes [provider]  HYDROcodone-acetaminophen (NORCO/VICODIN) 5-325 MG  tablet Take 2 tablets by mouth every 6 (six) hours as needed for moderate pain or severe pain. Patient not taking: Reported on 06/07/2018 08/27/17   Dossie Der    Social History   Socioeconomic History  . Marital status: Legally Separated    Spouse name: Not on file  . Number of children: 3  . Years of education: Not on file  . Highest education level: Not on file  Occupational History  . Occupation: Retired  Scientific laboratory technician  . Financial resource strain: Not on file  . Food insecurity:    Worry: Not on file    Inability: Not on file  . Transportation needs:    Medical: Not on file    Non-medical: Not on file  Tobacco Use  . Smoking status: Former Smoker    Packs/day: 1.00    Years: 50.00    Pack years: 50.00    Types: Cigarettes    Last attempt to quit: 05/16/1998    Years since quitting: 20.0  . Smokeless tobacco: Never Used  Substance and Sexual Activity  . Alcohol use: No    Alcohol/week: 0.0 standard drinks  . Drug use: No  . Sexual activity: Yes    Birth control/protection: Post-menopausal    Lifestyle  . Physical activity:    Days per week: Not on file    Minutes per session: Not on file  . Stress: Not on file  Relationships  . Social connections:    Talks on phone: Not on file    Gets together: Not on file    Attends religious service: Not on file    Active member of club or organization: Not on file    Attends meetings of clubs or organizations: Not on file    Relationship status: Not on file  . Intimate partner violence:    Fear of current or ex partner: Not on file    Emotionally abused: Not on file    Physically abused: Not on file    Forced sexual activity: Not on file  Other Topics Concern  . Not on file  Social History Narrative   Lives at [place] alone (most of the time).  Three grandchildren and a one year old great grandchild     Family History  Problem Relation Age of Onset  . Breast cancer Mother        Died age 68  . Cancer Mother 69       Breast  . Heart disease Mother   . Hyperlipidemia Mother   . Hypertension Mother   . Heart attack Mother     ROS: [x]  Positive   [ ]  Negative   [ ]  All sytems reviewed and are negative  Cardiovascular: []  chest pain/pressure []  palpitations []  SOB lying flat []  DOE []  pain in legs while walking []  pain in legs at rest []  pain in legs at night []  non-healing ulcers []  hx of DVT []  swelling in legs  Pulmonary: []  productive cough []  asthma/wheezing []  home O2  Neurologic: []  weakness in []  arms []  legs []  numbness in []  arms []  legs []  hx of CVA []  mini stroke [] difficulty speaking or slurred speech []  temporary loss of vision in one eye []  dizziness  Hematologic: []  hx of cancer []  bleeding problems []  problems with blood clotting easily  Endocrine:   []  diabetes []  thyroid disease  GI []  vomiting blood []  blood in stool  GU: []  CKD/renal failure []  HD--[]  M/W/F or []   T/T/S []  burning with urination []  blood in urine  Psychiatric: []  anxiety []   depression  Musculoskeletal: []  arthritis []  joint pain  Integumentary: []  rashes []  ulcers  Constitutional: []  fever []  chills   Physical Examination  Vitals:   06/07/18 1015 06/07/18 1030  BP: 136/60 (!) 144/61  Pulse:    Resp:    Temp:    SpO2:     Body mass index is 38.74 kg/m.  General:  WDWN in NAD HENT: WNL, normocephalic Pulmonary: normal non-labored breathing, without Rales, rhonchi,  wheezing Cardiac: regular, without  Murmurs, rubs or gallops Abdomen:soft, NT/ND, no masses Vascular Exam/Pulses: Left upper arm brachiocephalic AVF with aneurysmal segment in mid upper arm, some faint blanching erythema, no pain, no tenderness, good thrill in fistula, skin intact Left radial pulse palpabke Extremities: without ischemic changes, without Gangrene , without cellulitis; without open wounds;  Musculoskeletal: no muscle wasting or atrophy  Neurologic: A&O X 3; Appropriate Affect ; SENSATION: normal; MOTOR FUNCTION:  moving all extremities equally. Speech is fluent/normal   CBC    Component Value Date/Time   WBC 9.9 06/07/2018 1056   RBC 3.71 (L) 06/07/2018 1056   HGB 9.6 (L) 06/07/2018 1056   HCT 31.1 (L) 06/07/2018 1056   PLT 206 06/07/2018 1056   MCV 83.8 06/07/2018 1056   MCH 25.9 (L) 06/07/2018 1056   MCHC 30.9 06/07/2018 1056   RDW 14.5 06/07/2018 1056   LYMPHSABS 1.8 06/07/2018 1056   MONOABS 0.9 06/07/2018 1056   EOSABS 0.8 (H) 06/07/2018 1056   BASOSABS 0.1 06/07/2018 1056    BMET    Component Value Date/Time   NA 137 06/07/2018 1056   K 3.7 06/07/2018 1056   CL 97 (L) 06/07/2018 1056   CO2 24 06/07/2018 1056   GLUCOSE 150 (H) 06/07/2018 1056   BUN 25 (H) 06/07/2018 1056   CREATININE 9.22 (H) 06/07/2018 1056   CALCIUM 9.3 06/07/2018 1056   GFRNONAA 4 (L) 06/07/2018 1056   GFRAA 4 (L) 06/07/2018 1056    COAGS: Lab Results  Component Value Date   INR 1.12 04/06/2018   INR 1.05 03/25/2011   INR 1.07 06/13/2010     Non-Invasive  Vascular Imaging:    None   ASSESSMENT/PLAN: This is a 73 y.o. female that presents to the ED with concern for infection of her left upper arm brachiocephalic fistula (referred by dialysis center).  In reviewing the records and confirming with the patient this is an autogenous vein fistula using her own cephalic vein.  She has a little bit of discrete cellulitis on exam but nothing profound.  She denies any pain around the fistula, has a normal white count, and has been afebrile.  I think it would be okay for dialysis center to access her fistula for dialysis and she states this has been working fine and has a good thrill on exam.  We will recommend 7 days of Keflex at discharge.  We can arrange follow-up next week in clinic just to confirm that things are continuing to show improvement.  Given the fact that she has required multiple fistulogram's with intervention I confirmed that there have been no issues with flow volumes or access to the fistula and patient denies any problems that would warrant fistulogram at this time.  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  As a result I do not see any immediate role for revision today.  Marty Heck, MD [facility] Office: [phone] Pager: [place]

## 2018-06-07 NOTE — Discharge Instructions (Signed)
Your history, exam, and vascular surgery evaluation were consistent with a skin infection, cellulitis of your arm.  Please take the antibiotics for the next week to help treat this.  Please follow-up with your PCP and your vascular team.  If any symptoms change or worsen, please return to the nearest emergency department.

## 2018-06-07 NOTE — ED Notes (Signed)
Patient verbalizes understanding of discharge instructions. Opportunity for questioning and answers were provided. Armband removed by staff, pt discharged from ED.  

## 2018-06-08 ENCOUNTER — Telehealth: Payer: Self-pay | Admitting: Vascular Surgery

## 2018-06-08 NOTE — Telephone Encounter (Signed)
sch appt spk to pt 06/15/2018 315pm wound check

## 2018-06-08 NOTE — Telephone Encounter (Signed)
-----   Message from Marty Heck, MD sent at 06/07/2018 12:34 PM EST ----- Level 3 consult in ED.  Could we arrange left arm check next week in [facility] clinic?  Thanks,  Gerald Stabs

## 2018-06-12 ENCOUNTER — Other Ambulatory Visit: Payer: Self-pay

## 2018-06-12 ENCOUNTER — Encounter (HOSPITAL_COMMUNITY): Payer: Self-pay | Admitting: Vascular Surgery

## 2018-06-12 ENCOUNTER — Other Ambulatory Visit: Payer: Self-pay | Admitting: *Deleted

## 2018-06-12 NOTE — Anesthesia Preprocedure Evaluation (Addendum)
Anesthesia Evaluation  Patient identified by MRN, date of birth, ID band Patient awake    Reviewed: Allergy & Precautions, NPO status , Patient's Chart, lab work & pertinent test results  Airway Mallampati: II  TM Distance: >3 FB Neck ROM: Full    Dental   Pulmonary former smoker,    Pulmonary exam normal        Cardiovascular hypertension, Pt. on medications Normal cardiovascular exam     Neuro/Psych    GI/Hepatic   Endo/Other  diabetes, Type 2, Insulin Dependent  Renal/GU ESRF and DialysisRenal disease     Musculoskeletal   Abdominal   Peds  Hematology   Anesthesia Other Findings   Reproductive/Obstetrics                            Anesthesia Physical Anesthesia Plan  ASA: III  Anesthesia Plan: General   Post-op Pain Management:    Induction: Intravenous  PONV Risk Score and Plan: 2  Airway Management Planned: LMA  Additional Equipment:   Intra-op Plan:   Post-operative Plan: Extubation in OR  Informed Consent: I have reviewed the patients History and Physical, chart, labs and discussed the procedure including the risks, benefits and alternatives for the proposed anesthesia with the patient or authorized representative who has indicated his/her understanding and acceptance.       Plan Discussed with: CRNA and Surgeon  Anesthesia Plan Comments: (See PAT note written 06/12/2018 by Myra Gianotti, PA-C. )      Anesthesia Quick Evaluation

## 2018-06-12 NOTE — Progress Notes (Signed)
Pt denies any acute cardiopulmonary issues. Pt under the care of Dr. Norman Clay, Cardiology. Pt stated that a stress test was performed > 5 years ago. Pt made aware to stop taking vitamins fish oil and herbal medications. Do not take any NSAIDs ie: Ibuprofen, Advil, Naproxen (ALeve), MOtrin, BC and Goody Powder or Voltaren Gel. Pt made aware to take 21 units of Novolog 70/30 tonight. Pt made aware to check BG every 2 hours prior to arrival to hospital on DOS. Pt made aware to treat a BG < 70 with 4 ounces of apple or cranberry juice, wait 15 minutes after intervention to recheck BG, if BG remains < 70, call Short Stay unit to speak with a nurse. Pt verbalized understanding of all pre-op instructions.

## 2018-06-12 NOTE — Progress Notes (Signed)
Anesthesia Chart Review: SAME DAY WORK-UP   Case:  [id number] Date/Time:  06/13/18 1027   Procedures:      INSERTION OF DIALYSIS CATHETER (N/A )     ARTERIOVENOUS (AV) FISTULA CREATION vs. GRAFT (N/A )   Anesthesia type:  Monitor Anesthesia Care   Pre-op diagnosis:  clotted fistula   Location:  [facility] OR ROOM 16 / [place] [facility]   Surgeon:  Rosetta Posner, MD      DISCUSSION: Patient is a 73 year old female scheduled for the above procedure.   History includes ESRD (HD TTS; left brachiocephalic AVF, s/p cephalic vein angioplasty 08/25/17, 02/09/18 & 04/04/18), former smoker, DM2, HTN, CAD (MI, s/p RCA stent ~ 2003, [place]; RCA occlusion prior to stent, fills right-to-left collaterals 12/15/97), chronic systolic CHF, left BBB, dyspnea, HLD, left breast cancer (s/p mastectomy ~ 2005), PAD (right 5th toe wound '16), eosinophilia (saw hematology 05/17/18). She had severe pulmonary hypertension by 07/2017 echo.  - Admission 08/13/17-08/14/17 for SOB and near syncope. Recent echo 08/05/17 showed EF 40-45%, grade II diastolic dysfunction, moderate RV dysfunction, PASP 73, moderate MR, mild-moderate TR. 08/14/08 CT was negative for PE. Troponins elevated with flat trend (0.04-0.05-0.06-0.06-0.06) and thought due to ESRD. BNP 1371. Symptoms thought to be multifactorial including fluid overload, deconditioning, cardiomyopathy, and pulmonary hypertension. Hospitalist discussed case with cardiology who felt out-patient follow-up was appropriated. She underwent hemodialysis prior to discharge. - Admission (per Hospitalist) 08/04/17-08/05/17 for chest pain. Cardiology consulted Percival Spanish, Jeneen Rinks, MD) and felt symptoms were atypical for cardiac etiology. Troponin 0.06-0.02. BNP 1506. D-dimer 2.84. She did have a brief episode of SVT on telemetry. Echo results as below with stable EF, but RV dysfunction with severe pulmonary hypertension. Elevated d-dimer felt likely nonspecific in light of her comorbidities, but decision for CTA chest deferred  to primary team. Last cardiology in-patient encounter by Rozann Lesches, MD on 08/05/17 did not anticipate further ischemic testing at that time, but recommended continued out-patient follow-up with her primary cardiologist regarding RV dysfunction and worsening pulmonary hypertension.   - According to hematology noted in [place], she was referred to rheumatology on 06/07/18 by hematologist Wanita Chamberlain, MD [facility], see Care Everywhere) following blood work suggesting a "connective tissue disorder such as lupus."   She was last seen by her primary cardiologist Dr. Norman Clay in 08/2017 following her back-to-back hospitalizations. Continued medical therapy with one year follow-up recommended. There was no mention of the 08/05/17 echo report, so I will route report to Dr. Norman Clay (fax [phone]). Other than the increase in severity of pulmonary hypertension, I think most other findings were old.  Patient in need of hemodialysis access. Above reviewed with anesthesiologist Hoy Morn, MD. Anesthesiologist to evaluate on the day of surgery. Definitive plan at that time.   PROVIDERS: - She was seen by Neville Route, MD with [facility] on 01/17/18 to establish care ([place]). Previously was seen Nolene Ebbs, MD. - Fran Lowes, MD is nephrologist ([place]). Wanita Chamberlain, MD is hematologist. Last visit 05/17/18 (see [place]). Mercie Eon, MD is cardiologist. Last visit 08/18/17 (see [place]). Dr. Norman Clay recommended continued medical therapy. (03/18/14 echo referenced in note, but not more recent echo from 08/05/17.) Previously she was seen by Mertie Moores, MD in 2014 and Eileen Stanford, MD in 2015-2017.    LABS: As of 06/07/18 labs showed: Lab Results  Component Value Date   WBC 9.9 06/07/2018   HGB 9.6 (L) 06/07/2018   HCT  31.1 (L) 06/07/2018   PLT 206 06/07/2018   GLUCOSE 150 (H)  06/07/2018   ALT 7 06/07/2018   AST 14 (L) 06/07/2018   NA 137 06/07/2018   K 3.7 06/07/2018   CL 97 (L) 06/07/2018   CREATININE 9.22 (H) 06/07/2018   BUN 25 (H) 06/07/2018   CO2 24 06/07/2018   She is for ISTAT4 on arrival. A1c 6.9% on 01/17/18 ([place]).   IMAGES: CTA chest 08/14/17: IMPRESSION: 1. No acute cardiopulmonary disease. Specifically, no evidence of pulmonary embolism. 2. Cardiomegaly. 3. Coronary artery calcifications. Aortic Atherosclerosis (ICD10-I70.0). 4. Cholelithiasis.  CXR 08/04/17: IMPRESSION: Moderately enlarged cardiac silhouette. Pulmonary vascular congestion. Calcific atherosclerotic disease of the aorta.   EKG: 08/13/17: SR, left BBB. Left BBB is old (at least since 08/11/14).    CV: Echo 08/05/17: Study Conclusions - Left ventricle: The cavity size was at the upper limits of   normal. Wall thickness was increased in a pattern of mild LVH.   Systolic function was mildly to moderately reduced. The estimated   ejection fraction was in the range of 40% to 45%. There is   akinesis of the basalinferolateral, inferior, and inferoseptal   myocardium. Features are consistent with a pseudonormal left   ventricular filling pattern, with concomitant abnormal relaxation   and increased filling pressure (grade 2 diastolic dysfunction). - Aortic valve: Mildly to moderately calcified annulus. Trileaflet;   mildly calcified leaflets. - Mitral valve: Mildly to moderately calcified annulus. Moderately   thickened, moderately calcified leaflets . There was moderate   regurgitation. - Left atrium: The atrium was mildly dilated. - Right ventricle: The cavity size was mildly dilated. Systolic   function was moderately reduced. - Right atrium: The atrium was mildly dilated. Central venous   pressure (est): 15 mm Hg. - Tricuspid valve: There was mild-moderate regurgitation. - Pulmonary arteries: Systolic pressure was severely increased. PA   peak  pressure: 73 mm Hg (S). - Pericardium, extracardiac: There was no pericardial effusion. (Comparison echo 03/18/14: LVEF 40-45%, mild-moderate global LV hypokinesis, moderate MR, severe TR, RVSP 45.5 mmHg; 10/21/12: LVEF 30-35%, moderate MR, moderate-severe TR, RV function moderately reducted; 08/21/12: EF 40-45%, PA peak pressure 34 mmHg).  Cardiac cath 12/19/03: ANGIOGRAPHY: 1. The left main is smooth and normal. 2. The left anterior descending artery has mild to moderate stenosis in the     proximal left anterior descending.  This stenosis is approximately 30%.     There are minor luminal irregularities throughout the remainder of the     left anterior descending. 3. The first diagonal vessel is a moderate size vessel with 30-40% stenosis. 4. The left circumflex artery is a moderate to large vessel.  There is a     large first obtuse marginal artery that branches.  The inferior most     branch has a 90% stenosis.  This branch is approximately 1.5-2 mm in     diameter and is not an ideal candidate for stenting because of its small     size and the diffuse nature of the blockage.  We certainly would not be     able to fit any of the new drug eluding stents and a small metal stent     would likely occlude. 5. The right coronary artery is proximally occluded just prior to the stent.     There is a long area of occlusion in the distal and mid right coronary     artery filled via left to right collaterals.  LEFT VENTRICULOGRAM: The ejection fraction is approximately 50%. Inferior  wall hypokinesis. CONCLUSIONS: 1. The patient has a history of an inferior lateral myocardial infarction.     The right coronary artery stent is occluded, and it appears to be     chronic.  I do not think that we will be successful in opening this up     total occlusion.  In addition, she is completely asymptomatic and the     Cardiolite scan reveals a completed scar in this region. 2. She does have evidence of anterior  lateral ischemia on the Cardiolite     scan, and this is most likely due to this small branch of the obtuse     marginal artery.  This vessel is fairly small and although we could     attempt angioplasty I think that the risk of reclosure is quite high.  It     certainly would not decrease angioplasty of this vessel but certainly     would not decrease the risks of perioperative myocardial infarction with     her upcoming arm surgery.  I think that we need to continue with medical     therapy for this lesion.  She is at some increased risk for perioperative     myocardial infarction, but the area supplied by this vessel is quite     small and I think that she could be managed medically through this.     Past Medical History:  Diagnosis Date  . Arthritis   . Cancer [facility]) 2005    left breast  . CHF (congestive heart failure) ([place])   . Chronic back pain   . Chronic kidney disease 03/2014   dialysis t/th/sa  . Coronary artery disease   . Diabetes mellitus    Type 2  . Diabetic nephropathy ([place]) 01-08-13  . Dyslipidemia 01-08-13  . ESRD on hemodialysis ([place])    Tu, Th, Sat  . Headache   . Hyperlipidemia   . Hypertension   . LBBB (left bundle branch block)   . Proteinuria 01-08-13  . Pulmonary hypertension ([place])    PA peak pressure 73 mmHg 08/05/17 echo  . Thyroid disease 01-08-13   Hyper-parathyroidism-secondary    Past Surgical History:  Procedure Laterality Date  . A/V FISTULAGRAM N/A 08/25/2017   Procedure: A/V FISTULAGRAM - Left Arm;  Surgeon: Angelia Mould, MD;  Location: [place] CV LAB;  Service: Cardiovascular;  Laterality: N/A;  . A/V FISTULAGRAM N/A 02/09/2018   Procedure: A/V ALPFXTKWIOX;  Surgeon: Elam Dutch, MD;  Location: [place] CV LAB;  Service: Cardiovascular;  Laterality: N/A;  . A/V FISTULAGRAM Left 04/04/2018   Procedure: A/V FISTULAGRAM;  Surgeon: Marty Heck, MD;  Location: [place] CV LAB;  Service: Cardiovascular;   Laterality: Left;  . ABDOMINAL AORTAGRAM N/A 08/11/2014   Procedure: ABDOMINAL Maxcine Ham;  Surgeon: Angelia Mould, MD;  Location: [facility] CATH LAB;  Service: Cardiovascular;  Laterality: N/A;  . ABDOMINAL HYSTERECTOMY    . APPENDECTOMY    . Arm surgery     Left arm trauma  . AV FISTULA PLACEMENT Left 08/21/2014   Procedure: LEFT ARM ARTERIOVENOUS (AV) FISTULA CREATION ;  Surgeon: Angelia Mould, MD;  Location: [place];  Service: Vascular;  Laterality: Left;  . BACK SURGERY    . CATARACT EXTRACTION W/PHACO Left 12/22/2014   Procedure: CATARACT EXTRACTION PHACO AND INTRAOCULAR LENS PLACEMENT (IOC);  Surgeon: Tonny Branch, MD;  Location: [facility];  Service: Ophthalmology;  Laterality: Left;  CDE 13.48  . CORONARY ANGIOPLASTY  12/19/2003   inferior wall hypokinesis. ef 50%  . dialysis catheter    . IR FLUORO GUIDE CV LINE RIGHT  04/06/2018  . IR REMOVAL TUN CV CATH W/O FL  04/25/2018  . IR US GUIDE VASC ACCESS RIGHT  04/06/2018  . MASTECTOMY     Left  . PERIPHERAL VASCULAR BALLOON ANGIOPLASTY Left 08/25/2017   Procedure: PERIPHERAL VASCULAR BALLOON ANGIOPLASTY;  Surgeon: Angelia Mould, MD;  Location: [place] CV LAB;  Service: Cardiovascular;  Laterality: Left;  arm fistula  . PERIPHERAL VASCULAR BALLOON ANGIOPLASTY Left 02/09/2018   Procedure: PERIPHERAL VASCULAR BALLOON ANGIOPLASTY;  Surgeon: Elam Dutch, MD;  Location: [place] CV LAB;  Service: Cardiovascular;  Laterality: Left;  AV Fistula  . PERIPHERAL VASCULAR BALLOON ANGIOPLASTY Left 04/04/2018   Procedure: PERIPHERAL VASCULAR BALLOON ANGIOPLASTY;  Surgeon: Marty Heck, MD;  Location: [place] CV LAB;  Service: Cardiovascular;  Laterality: Left;  UPPER ARM FISTULA  . PERIPHERAL VASCULAR CATHETERIZATION N/A 09/16/2015   Procedure: Fistulagram;  Surgeon: Rosetta Posner, MD;  Location: [place] CV LAB;  Service: Cardiovascular;  Laterality: N/A;  . SPINE SURGERY    . TONSILLECTOMY    . TRANSTHORACIC  ECHOCARDIOGRAM  10/01/2010    Left ventricle: The cavity size was mildly dilated. Wall thickness was increased in a pattern of mild LVH. Systolic function was   mildly reduced. The estimated ejection fraction was in the range  of 45% to 50%.   . TUBAL LIGATION      MEDICATIONS: . lidocaine (PF) (XYLOCAINE) 1 % injection 0.3 mL   . aspirin EC 81 MG EC tablet  . AURYXIA 1 GM 210 MG(Fe) tablet  . calcitRIOL (ROCALTROL) 0.25 MCG capsule  . carvedilol (COREG) 6.25 MG tablet  . cephALEXin (KEFLEX) 500 MG capsule  . diclofenac sodium (VOLTAREN) 1 % GEL  . docusate sodium (COLACE) 100 MG capsule  . DULoxetine (CYMBALTA) 30 MG capsule  . gabapentin (NEURONTIN) 100 MG capsule  . hydrALAZINE (APRESOLINE) 100 MG tablet  . HYDROcodone-acetaminophen (NORCO/VICODIN) 5-325 MG tablet  . lidocaine-prilocaine (EMLA) cream  . nitroGLYCERIN (NITROSTAT) 0.4 MG SL tablet  . NOVOLOG MIX 70/30 FLEXPEN (70-30) 100 UNIT/ML FlexPen  . polyethylene glycol (MIRALAX / GLYCOLAX) packet  . tiZANidine (ZANAFLEX) 4 MG tablet    Myra Gianotti, PA-C Surgical Short Stay/Anesthesiology [facility] Phone [phone] [facility] Phone [phone] 06/12/2018 3:29 PM

## 2018-06-13 ENCOUNTER — Encounter (HOSPITAL_COMMUNITY)
Admission: RE | Disposition: A | Discharge status: Home / Self Care | Payer: Self-pay | Source: Ambulatory Visit | Attending: Vascular Surgery

## 2018-06-13 ENCOUNTER — Ambulatory Visit (HOSPITAL_COMMUNITY): Payer: Medicare Other | Admitting: Vascular Surgery

## 2018-06-13 ENCOUNTER — Ambulatory Visit (HOSPITAL_COMMUNITY)
Admission: RE | Admit: 2018-06-13 | Discharge: 2018-06-13 | Disposition: A | Discharge status: Home / Self Care | Payer: Medicare Other | Source: Ambulatory Visit | Attending: Vascular Surgery | Admitting: Vascular Surgery

## 2018-06-13 ENCOUNTER — Ambulatory Visit (HOSPITAL_COMMUNITY): Payer: Medicare Other

## 2018-06-13 ENCOUNTER — Encounter (HOSPITAL_COMMUNITY): Payer: Self-pay

## 2018-06-13 DIAGNOSIS — N2581 Secondary hyperparathyroidism of renal origin: Secondary | ICD-10-CM | POA: Insufficient documentation

## 2018-06-13 DIAGNOSIS — E1122 Type 2 diabetes mellitus with diabetic chronic kidney disease: Secondary | ICD-10-CM | POA: Diagnosis not present

## 2018-06-13 DIAGNOSIS — Z79899 Other long term (current) drug therapy: Secondary | ICD-10-CM | POA: Diagnosis not present

## 2018-06-13 DIAGNOSIS — Z853 Personal history of malignant neoplasm of breast: Secondary | ICD-10-CM | POA: Insufficient documentation

## 2018-06-13 DIAGNOSIS — I2510 Atherosclerotic heart disease of native coronary artery without angina pectoris: Secondary | ICD-10-CM | POA: Diagnosis not present

## 2018-06-13 DIAGNOSIS — Z955 Presence of coronary angioplasty implant and graft: Secondary | ICD-10-CM | POA: Insufficient documentation

## 2018-06-13 DIAGNOSIS — I272 Pulmonary hypertension, unspecified: Secondary | ICD-10-CM | POA: Diagnosis not present

## 2018-06-13 DIAGNOSIS — I5022 Chronic systolic (congestive) heart failure: Secondary | ICD-10-CM | POA: Diagnosis not present

## 2018-06-13 DIAGNOSIS — Z794 Long term (current) use of insulin: Secondary | ICD-10-CM | POA: Insufficient documentation

## 2018-06-13 DIAGNOSIS — I132 Hypertensive heart and chronic kidney disease with heart failure and with stage 5 chronic kidney disease, or end stage renal disease: Secondary | ICD-10-CM | POA: Diagnosis present

## 2018-06-13 DIAGNOSIS — Z992 Dependence on renal dialysis: Secondary | ICD-10-CM | POA: Insufficient documentation

## 2018-06-13 DIAGNOSIS — E1151 Type 2 diabetes mellitus with diabetic peripheral angiopathy without gangrene: Secondary | ICD-10-CM | POA: Insufficient documentation

## 2018-06-13 DIAGNOSIS — Y832 Surgical operation with anastomosis, bypass or graft as the cause of abnormal reaction of the patient, or of later complication, without mention of misadventure at the time of the procedure: Secondary | ICD-10-CM | POA: Insufficient documentation

## 2018-06-13 DIAGNOSIS — Z7982 Long term (current) use of aspirin: Secondary | ICD-10-CM | POA: Diagnosis not present

## 2018-06-13 DIAGNOSIS — I429 Cardiomyopathy, unspecified: Secondary | ICD-10-CM | POA: Insufficient documentation

## 2018-06-13 DIAGNOSIS — I252 Old myocardial infarction: Secondary | ICD-10-CM | POA: Insufficient documentation

## 2018-06-13 DIAGNOSIS — Z87891 Personal history of nicotine dependence: Secondary | ICD-10-CM | POA: Diagnosis not present

## 2018-06-13 DIAGNOSIS — N186 End stage renal disease: Secondary | ICD-10-CM | POA: Insufficient documentation

## 2018-06-13 DIAGNOSIS — Z9889 Other specified postprocedural states: Secondary | ICD-10-CM

## 2018-06-13 DIAGNOSIS — T82858A Stenosis of vascular prosthetic devices, implants and grafts, initial encounter: Secondary | ICD-10-CM | POA: Diagnosis not present

## 2018-06-13 HISTORY — DX: Pulmonary hypertension, unspecified: I27.20

## 2018-06-13 HISTORY — DX: Presence of spectacles and contact lenses: Z97.3

## 2018-06-13 HISTORY — PX: AV FISTULA PLACEMENT: SHX1204

## 2018-06-13 HISTORY — PX: INSERTION OF DIALYSIS CATHETER: SHX1324

## 2018-06-13 LAB — GLUCOSE, CAPILLARY
Glucose-Capillary: 120 mg/dL — ABNORMAL HIGH (ref 70–99)
Glucose-Capillary: 113 mg/dL — ABNORMAL HIGH (ref 70–99)
Glucose-Capillary: 133 mg/dL — ABNORMAL HIGH (ref 70–99)

## 2018-06-13 LAB — POCT I-STAT 4, (NA,K, GLUC, HGB,HCT)
Glucose, Bld: 115 mg/dL — ABNORMAL HIGH (ref 70–99)
HCT: 26 % — ABNORMAL LOW (ref 36.0–46.0)
Hemoglobin: 8.8 g/dL — ABNORMAL LOW (ref 12.0–15.0)
Potassium: 3.8 mmol/L (ref 3.5–5.1)
Sodium: 135 mmol/L (ref 135–145)

## 2018-06-13 SURGERY — INSERTION OF DIALYSIS CATHETER
Anesthesia: General | Site: Chest | Laterality: Right

## 2018-06-13 MED ORDER — 0.9 % SODIUM CHLORIDE (POUR BTL) OPTIME
TOPICAL | Status: DC | PRN
  Administered 2018-06-13: 1000 mL

## 2018-06-13 MED ORDER — ONDANSETRON HCL 4 MG/2ML IJ SOLN
INTRAMUSCULAR | Status: AC
  Filled 2018-06-13: qty 2

## 2018-06-13 MED ORDER — MIDAZOLAM HCL 2 MG/2ML IJ SOLN
INTRAMUSCULAR | Status: AC
  Filled 2018-06-13: qty 2

## 2018-06-13 MED ORDER — LACTATED RINGERS IV SOLN
INTRAVENOUS | Status: DC | PRN
  Administered 2018-06-13: 10:00:00 via INTRAVENOUS

## 2018-06-13 MED ORDER — HEPARIN SODIUM (PORCINE) 1000 UNIT/ML IJ SOLN
INTRAMUSCULAR | Status: AC
  Filled 2018-06-13: qty 1

## 2018-06-13 MED ORDER — HYDROCODONE-ACETAMINOPHEN 5-325 MG PO TABS: 2.0000 | ORAL_TABLET | Freq: Four times a day (QID) | ORAL | 0 refills | Status: DC | PRN

## 2018-06-13 MED ORDER — FENTANYL CITRATE (PF) 100 MCG/2ML IJ SOLN
INTRAMUSCULAR | Status: DC | PRN
  Administered 2018-06-13: 25 ug via INTRAVENOUS
  Administered 2018-06-13: 50 ug via INTRAVENOUS
  Administered 2018-06-13: 25 ug via INTRAVENOUS

## 2018-06-13 MED ORDER — PROPOFOL 500 MG/50ML IV EMUL
INTRAVENOUS | Status: DC | PRN
  Administered 2018-06-13: 70 ug/kg/min via INTRAVENOUS

## 2018-06-13 MED ORDER — LIDOCAINE 2% (20 MG/ML) 5 ML SYRINGE
INTRAMUSCULAR | Status: AC
  Filled 2018-06-13: qty 5

## 2018-06-13 MED ORDER — CEFAZOLIN SODIUM-DEXTROSE 2-4 GM/100ML-% IV SOLN
INTRAVENOUS | Status: AC
  Filled 2018-06-13: qty 100

## 2018-06-13 MED ORDER — PROTAMINE SULFATE 10 MG/ML IV SOLN
INTRAVENOUS | Status: AC
  Filled 2018-06-13: qty 25

## 2018-06-13 MED ORDER — FENTANYL CITRATE (PF) 250 MCG/5ML IJ SOLN
INTRAMUSCULAR | Status: AC
  Filled 2018-06-13: qty 5

## 2018-06-13 MED ORDER — LIDOCAINE-EPINEPHRINE 0.5 %-1:200000 IJ SOLN
INTRAMUSCULAR | Status: AC
  Filled 2018-06-13: qty 1

## 2018-06-13 MED ORDER — SODIUM CHLORIDE 0.9 % IV SOLN
INTRAVENOUS | Status: DC | PRN
  Administered 2018-06-13: 500 mL

## 2018-06-13 MED ORDER — HEPARIN SODIUM (PORCINE) 1000 UNIT/ML IJ SOLN
INTRAMUSCULAR | Status: DC | PRN
  Administered 2018-06-13: 3400 [IU]

## 2018-06-13 MED ORDER — MEPERIDINE HCL 50 MG/ML IJ SOLN: 6.2500 mg | INTRAMUSCULAR | Status: DC | PRN

## 2018-06-13 MED ORDER — ALBUMIN HUMAN 5 % IV SOLN
INTRAVENOUS | Status: DC | PRN
  Administered 2018-06-13: 12:00:00 via INTRAVENOUS

## 2018-06-13 MED ORDER — ONDANSETRON HCL 4 MG/2ML IJ SOLN: 4.0000 mg | Freq: Once | INTRAMUSCULAR | Status: DC | PRN

## 2018-06-13 MED ORDER — PHENYLEPHRINE 40 MCG/ML (10ML) SYRINGE FOR IV PUSH (FOR BLOOD PRESSURE SUPPORT)
PREFILLED_SYRINGE | INTRAVENOUS | Status: AC
  Filled 2018-06-13: qty 10

## 2018-06-13 MED ORDER — CEFAZOLIN SODIUM-DEXTROSE 2-4 GM/100ML-% IV SOLN
2.0000 g | INTRAVENOUS | Status: AC
  Administered 2018-06-13: 2 g via INTRAVENOUS

## 2018-06-13 MED ORDER — SODIUM CHLORIDE 0.9 % IV SOLN
INTRAVENOUS | Status: AC
  Filled 2018-06-13: qty 1.2

## 2018-06-13 MED ORDER — SODIUM CHLORIDE 0.9 % IV SOLN: INTRAVENOUS | Status: DC

## 2018-06-13 MED ORDER — PROPOFOL 10 MG/ML IV BOLUS
INTRAVENOUS | Status: DC | PRN
  Administered 2018-06-13 (×2): 20 mg via INTRAVENOUS

## 2018-06-13 MED ORDER — EPHEDRINE SULFATE-NACL 50-0.9 MG/10ML-% IV SOSY
PREFILLED_SYRINGE | INTRAVENOUS | Status: DC | PRN
  Administered 2018-06-13: 10 mg via INTRAVENOUS
  Administered 2018-06-13: 5 mg via INTRAVENOUS
  Administered 2018-06-13: 10 mg via INTRAVENOUS

## 2018-06-13 MED ORDER — ONDANSETRON HCL 4 MG/2ML IJ SOLN
INTRAMUSCULAR | Status: DC | PRN
  Administered 2018-06-13: 4 mg via INTRAVENOUS

## 2018-06-13 MED ORDER — HYDROMORPHONE HCL 1 MG/ML IJ SOLN: 0.2500 mg | INTRAMUSCULAR | Status: DC | PRN

## 2018-06-13 MED ORDER — SODIUM CHLORIDE 0.9 % IV SOLN
INTRAVENOUS | Status: DC | PRN
  Administered 2018-06-13: 20 ug/min via INTRAVENOUS

## 2018-06-13 MED ORDER — PHENYLEPHRINE 40 MCG/ML (10ML) SYRINGE FOR IV PUSH (FOR BLOOD PRESSURE SUPPORT)
PREFILLED_SYRINGE | INTRAVENOUS | Status: DC | PRN
  Administered 2018-06-13 (×2): 120 ug via INTRAVENOUS
  Administered 2018-06-13: 80 ug via INTRAVENOUS
  Administered 2018-06-13 (×6): 120 ug via INTRAVENOUS

## 2018-06-13 SURGICAL SUPPLY — 56 items
ARMBAND PINK RESTRICT EXTREMIT (MISCELLANEOUS) ×8 IMPLANT
BAG DECANTER FOR FLEXI CONT (MISCELLANEOUS) ×4 IMPLANT
BIOPATCH RED 1 DISK 7.0 (GAUZE/BANDAGES/DRESSINGS) ×3 IMPLANT
BIOPATCH RED 1IN DISK 7.0MM (GAUZE/BANDAGES/DRESSINGS) ×1
CANISTER SUCT 3000ML PPV (MISCELLANEOUS) ×4 IMPLANT
CANNULA VESSEL 3MM 2 BLNT TIP (CANNULA) ×4 IMPLANT
CATH PALINDROME RT-P 15FX19CM (CATHETERS) IMPLANT
CATH PALINDROME RT-P 15FX23CM (CATHETERS) ×2 IMPLANT
CATH PALINDROME RT-P 15FX28CM (CATHETERS) IMPLANT
CATH PALINDROME RT-P 15FX55CM (CATHETERS) IMPLANT
CLIP LIGATING EXTRA MED SLVR (CLIP) ×4 IMPLANT
CLIP LIGATING EXTRA SM BLUE (MISCELLANEOUS) ×4 IMPLANT
COVER DOME SNAP 22 D (MISCELLANEOUS) ×2 IMPLANT
COVER PROBE W GEL 5X96 (DRAPES) ×4 IMPLANT
COVER SURGICAL LIGHT HANDLE (MISCELLANEOUS) ×4 IMPLANT
COVER WAND RF STERILE (DRAPES) ×4 IMPLANT
DECANTER SPIKE VIAL GLASS SM (MISCELLANEOUS) ×4 IMPLANT
DERMABOND ADHESIVE PROPEN (GAUZE/BANDAGES/DRESSINGS) ×2
DERMABOND ADVANCED (GAUZE/BANDAGES/DRESSINGS) ×4
DERMABOND ADVANCED .7 DNX12 (GAUZE/BANDAGES/DRESSINGS) ×2 IMPLANT
DERMABOND ADVANCED .7 DNX6 (GAUZE/BANDAGES/DRESSINGS) IMPLANT
DRAPE C-ARM 42X72 X-RAY (DRAPES) ×2 IMPLANT
DRAPE CHEST BREAST 15X10 FENES (DRAPES) ×4 IMPLANT
ELECT REM PT RETURN 9FT ADLT (ELECTROSURGICAL) ×4
ELECTRODE REM PT RTRN 9FT ADLT (ELECTROSURGICAL) ×2 IMPLANT
GAUZE 4X4 16PLY RFD (DISPOSABLE) ×4 IMPLANT
GLOVE SS BIOGEL STRL SZ 7.5 (GLOVE) ×2 IMPLANT
GLOVE SUPERSENSE BIOGEL SZ 7.5 (GLOVE) ×2
GOWN STRL REUS W/ TWL LRG LVL3 (GOWN DISPOSABLE) ×6 IMPLANT
GOWN STRL REUS W/TWL LRG LVL3 (GOWN DISPOSABLE) ×10
GRAFT GORETEX STRT 4-7X45 (Vascular Products) ×2 IMPLANT
KIT BASIN OR (CUSTOM PROCEDURE TRAY) ×4 IMPLANT
KIT TURNOVER KIT B (KITS) ×4 IMPLANT
NDL 18GX1X1/2 (RX/OR ONLY) (NEEDLE) ×2 IMPLANT
NDL HYPO 25GX1X1/2 BEV (NEEDLE) ×2 IMPLANT
NEEDLE 18GX1X1/2 (RX/OR ONLY) (NEEDLE) ×4 IMPLANT
NEEDLE 22X1 1/2 (OR ONLY) (NEEDLE) IMPLANT
NEEDLE HYPO 25GX1X1/2 BEV (NEEDLE) ×4 IMPLANT
NS IRRIG 1000ML POUR BTL (IV SOLUTION) ×4 IMPLANT
PACK CV ACCESS (CUSTOM PROCEDURE TRAY) ×4 IMPLANT
PACK SURGICAL SETUP 50X90 (CUSTOM PROCEDURE TRAY) ×4 IMPLANT
PAD ARMBOARD 7.5X6 YLW CONV (MISCELLANEOUS) ×8 IMPLANT
SOAP 2 % CHG 4 OZ (WOUND CARE) ×4 IMPLANT
SUT ETHILON 3 0 PS 1 (SUTURE) ×4 IMPLANT
SUT PROLENE 6 0 CC (SUTURE) ×8 IMPLANT
SUT VIC AB 3-0 SH 27 (SUTURE) ×2
SUT VIC AB 3-0 SH 27X BRD (SUTURE) ×2 IMPLANT
SUT VICRYL 4-0 PS2 18IN ABS (SUTURE) ×4 IMPLANT
SYR 10ML LL (SYRINGE) ×4 IMPLANT
SYR 20CC LL (SYRINGE) ×4 IMPLANT
SYR 5ML LL (SYRINGE) ×6 IMPLANT
SYR CONTROL 10ML LL (SYRINGE) ×4 IMPLANT
TOWEL GREEN STERILE (TOWEL DISPOSABLE) ×10 IMPLANT
TOWEL GREEN STERILE FF (TOWEL DISPOSABLE) ×2 IMPLANT
UNDERPAD 30X30 (UNDERPADS AND DIAPERS) ×4 IMPLANT
WATER STERILE IRR 1000ML POUR (IV SOLUTION) ×4 IMPLANT

## 2018-06-13 NOTE — Transfer of Care (Signed)
Immediate Anesthesia Transfer of Care Note  Patient: Alexis [name]  Procedure(s) Performed: INSERTION OF DIALYSIS CATHETER, right internal jugular (Right Chest) insertion of right arm ARTERIOVENOUS (AV) gore-tex GRAFT (Right Arm Upper)  Patient Location: PACU  Anesthesia Type:General  Level of Consciousness: awake and alert   Airway & Oxygen Therapy: Patient Spontanous Breathing and Patient connected to face mask oxygen  Post-op Assessment: Report given to RN  Post vital signs: stable  Last Vitals:  Vitals Value Taken Time  BP 105/72 06/13/2018 12:31 PM  Temp    Pulse 112 06/13/2018 12:35 PM  Resp 21 06/13/2018 12:35 PM  SpO2 100 % 06/13/2018 12:35 PM  Vitals shown include unvalidated device data.  Last Pain:  Vitals:   06/13/18 1230  TempSrc:   PainSc: (P) 0-No pain      Patients Stated Pain Goal: 3 (76/22/63 3354)  Complications: No apparent anesthesia complications

## 2018-06-13 NOTE — Discharge Instructions (Signed)
° °  [facility] ° °Discharge Instructions ° °AV Fistula or Graft Surgery for Dialysis Access ° °Please refer to the following instructions for your post-procedure care. Your surgeon or physician assistant will discuss any changes with you. ° °Activity ° °You may drive the day following your surgery, if you are comfortable and no longer taking prescription pain medication. Resume full activity as the soreness in your incision resolves. ° °Bathing/Showering ° °You may shower after you go home. Keep your incision dry for 48 hours. Do not soak in a bathtub, hot tub, or swim until the incision heals completely. You may not shower if you have a hemodialysis catheter. ° °Incision Care ° °Clean your incision with mild soap and water after 48 hours. Pat the area dry with a clean towel. You do not need a bandage unless otherwise instructed. Do not apply any ointments or creams to your incision. You may have skin glue on your incision. Do not peel it off. It will come off on its own in about one week. Your arm may swell a bit after surgery. To reduce swelling use pillows to elevate your arm so it is above your heart. Your doctor will tell you if you need to lightly wrap your arm with an ACE bandage. ° °Diet ° °Resume your normal diet. There are not special food restrictions following this procedure. In order to heal from your surgery, it is CRITICAL to get adequate nutrition. Your body requires vitamins, minerals, and protein. Vegetables are the best source of vitamins and minerals. Vegetables also provide the perfect balance of protein. Processed food has little nutritional value, so try to avoid this. ° °Medications ° °Resume taking all of your medications. If your incision is causing pain, you may take over-the counter pain relievers such as acetaminophen (Tylenol). If you were prescribed a stronger pain medication, please be aware these medications can cause nausea and constipation. Prevent  nausea by taking the medication with a snack or meal. Avoid constipation by drinking plenty of fluids and eating foods with high amount of fiber, such as fruits, vegetables, and grains. Do not take Tylenol if you are taking prescription pain medications. ° ° ° ° °Follow up °Your surgeon may want to see you in the office following your access surgery. If so, this will be arranged at the time of your surgery. ° °Please call us immediately for any of the following conditions: ° °Increased pain, redness, drainage (pus) from your incision site °Fever of 101 degrees or higher °Severe or worsening pain at your incision site °Hand pain or numbness. ° °Reduce your risk of vascular disease: ° °Stop smoking. If you would like help, call [facility] at 1-800-QUIT-NOW (1-800-784-8669) or [place] at [phone] ° °Manage your cholesterol °Maintain a desired weight °Control your diabetes °Keep your blood pressure down ° °Dialysis ° °It will take several weeks to several months for your new dialysis access to be ready for use. Your surgeon will determine when it is OK to use it. Your nephrologist will continue to direct your dialysis. You can continue to use your Permcath until your new access is ready for use. ° °If you have any questions, please call the office at [phone]. ° °

## 2018-06-13 NOTE — Op Note (Signed)
    OPERATIVE REPORT  DATE OF SURGERY: 06/13/2018  PATIENT: Alexis [name], 73 y.o. female MRN: [id number]  DOB: February 27, 1946  PRE-OPERATIVE DIAGNOSIS: End-stage renal disease  POST-OPERATIVE DIAGNOSIS:  Same  PROCEDURE: #1 right IJ tunnel catheter with SonoSite visualization, #2 right forearm loop AV Gore-Tex graft  SURGEON:  Curt Jews, M.D.  PHYSICIAN ASSISTANT: Laurence Slate, PA-C  ANESTHESIA: LMA  EBL: per anesthesia record  Total I/O In: 750 [I.V.:400; IV Piggyback:350] Out: 20 [Blood:20]  BLOOD ADMINISTERED: none  DRAINS: none  SPECIMEN: none  COUNTS CORRECT:  YES  PATIENT DISPOSITION:  PACU - hemodynamically stable  PROCEDURE DETAILS: Patient was taken the operating placed supine position where the area of the right neck was prepped and draped in usual sterile fashion.  SonoSite ultrasound was used to visualize the internal jugular vein which was widely patent.  Using an 18-gauge needle and SonoSite visualization the right internal jugular vein was accessed at the base of the neck and a guidewire was passed centrally.  This was confirmed with fluoroscopy.  A dilator and peel-away sheath was passed over the guidewire.  Dilator and guidewire removed.  The catheter was brought through the peel-away sheath which was then removed as well.  The tips of the catheter was positioned at the level of the distal right atrium.  The catheter was brought through a subcutaneous tunnel through a separate stab incision and the 2 lm ports were attached.  Both lumens flushed and aspirated easily and were locked with 1000/cc heparin.  The catheter was secured to the skin with a 3-0 nylon stitch and the entry site was closed with a 4-0 subcuticular Vicryl stitch.  All dressing was applied.  Next attention was turned to the right arm.  SonoSite ultrasound was used to visualize the cephalic and basilic vein which were both very small and deep under the surface of the fat.  The patient did have a  moderate-sized brachial vein alongside the brachial artery at the antecubital space.  An incision was made over the antecubital space and the brachial artery was isolated and was of good caliber.  The brachial vein was also good caliber.  Tributary branches were ligated with 4-0 silk ties and divided on the artery and vein.  A separate incision was made over the distal forearm in the loop configuration tunnel was created.  A 4 x 7 tapered Gore-Tex graft was brought through the tunnel.  The artery was occluded proximally distally and was opened with 11 blade and sent longitudinally with Potts scissors.  The graft was spatulated with approximately 5 mm segment left intact.  The graft was sewn end-to-side to the brachial artery with a running 6-0 Prolene suture.  This anastomosis was tested and found to be adequate.  The graft was flushed with heparinized saline and reoccluded.  Next the brachial vein was occluded proximally distally and was opened with an 11 blade sent longitudinally with Potts scissors.  The graft was cut to the appropriate length and was spatulated and sewn end-to-side to the vein with a running 6-0 Prolene suture.  Clamps removed and excellent thrill was noted.  The wounds were irrigated with saline.  Hemostasis obtained electrocautery.  Wounds were closed with 3-0 Vicryl in the subcutaneous and subcuticular tissue.  Sterile dressing was applied and the patient was transferred to the recovery room where chest x-rays pending   Rosetta Posner, M.D., [facility] 06/13/2018 2:54 PM

## 2018-06-13 NOTE — Anesthesia Procedure Notes (Signed)
Procedure Name: LMA Insertion Date/Time: 06/13/2018 11:30 AM Performed by: Lillia Abed, MD Pre-anesthesia Checklist: Patient identified, Emergency Drugs available, Patient being monitored, Suction available and Timeout performed Patient Re-evaluated:Patient Re-evaluated prior to induction Oxygen Delivery Method: Circle system utilized and Simple face mask Preoxygenation: Pre-oxygenation with 100% oxygen Induction Type: IV induction Ventilation: Oral airway inserted - appropriate to patient size and Mask ventilation without difficulty LMA: LMA inserted LMA Size: 4.0 Number of attempts: 1 Tube secured with: Tape Dental Injury: Teeth and Oropharynx as per pre-operative assessment  Comments: Due to position of IV, LMA placed instead of proceeding with MAC. Placed by Raymond Gurney, SRNA

## 2018-06-13 NOTE — Interval H&P Note (Signed)
History and Physical Interval Note:  06/13/2018 9:41 AM  Alexis [name]  has presented today for surgery, with the diagnosis of clotted fistula  The various methods of treatment have been discussed with the patient and family. After consideration of risks, benefits and other options for treatment, the patient has consented to  Procedure(s): INSERTION OF DIALYSIS CATHETER (N/A) ARTERIOVENOUS (AV) FISTULA CREATION vs. GRAFT (N/A) as a surgical intervention .  The patient's history has been reviewed, patient examined, no change in status, stable for surgery.  I have reviewed the patient's chart and labs.  Questions were answered to the patient's satisfaction.    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  No further options for left arm access.  Discussed with the patient.  Recommend catheter for acute dialysis and right arm access for long-term dialysis.  Will determine fistula versus graft in the operating room with SonoSite visualization.  Discussed with the patient and her family present   Curt Jews

## 2018-06-13 NOTE — Anesthesia Postprocedure Evaluation (Signed)
Anesthesia Post Note  Patient: Alexis [name]  Procedure(s) Performed: INSERTION OF DIALYSIS CATHETER, right internal jugular (Right Chest) insertion of right arm ARTERIOVENOUS (AV) gore-tex GRAFT (Right Arm Upper)     Patient location during evaluation: PACU Anesthesia Type: General Level of consciousness: awake and alert Pain management: pain level controlled Vital Signs Assessment: post-procedure vital signs reviewed and stable Respiratory status: spontaneous breathing, nonlabored ventilation, respiratory function stable and patient connected to nasal cannula oxygen Cardiovascular status: blood pressure returned to baseline and stable Postop Assessment: no apparent nausea or vomiting Anesthetic complications: no    Last Vitals:  Vitals:   06/13/18 1231 06/13/18 1310  BP: 105/72 (!) 135/51  Pulse: (!) 110 100  Resp: (!) 23 18  Temp:  36.7 C  SpO2: 100% 98%    Last Pain:  Vitals:   06/13/18 1230  TempSrc:   PainSc: 0-No pain                 Alfonzia Woolum DAVID

## 2018-06-14 ENCOUNTER — Encounter (HOSPITAL_COMMUNITY): Payer: Self-pay | Admitting: Vascular Surgery

## 2018-06-15 ENCOUNTER — Ambulatory Visit: Payer: Medicare Other | Admitting: Physician Assistant

## 2018-06-15 ENCOUNTER — Encounter (HOSPITAL_COMMUNITY): Payer: Self-pay | Admitting: Physician Assistant

## 2018-06-15 ENCOUNTER — Encounter: Payer: Self-pay | Admitting: Family

## 2018-07-04 DIAGNOSIS — R5381 Other malaise: Secondary | ICD-10-CM | POA: Insufficient documentation

## 2018-07-04 DIAGNOSIS — M8589 Other specified disorders of bone density and structure, multiple sites: Secondary | ICD-10-CM | POA: Insufficient documentation

## 2018-07-18 ENCOUNTER — Emergency Department (HOSPITAL_COMMUNITY)
Admission: EM | Admit: 2018-07-18 | Discharge: 2018-07-18 | Disposition: A | Discharge status: Home / Self Care | Payer: Medicare Other | Attending: Emergency Medicine | Admitting: Emergency Medicine

## 2018-07-18 ENCOUNTER — Encounter (HOSPITAL_COMMUNITY): Payer: Self-pay | Admitting: Emergency Medicine

## 2018-07-18 ENCOUNTER — Emergency Department (HOSPITAL_COMMUNITY): Payer: Medicare Other

## 2018-07-18 ENCOUNTER — Other Ambulatory Visit (HOSPITAL_COMMUNITY): Payer: Self-pay | Admitting: Nephrology

## 2018-07-18 ENCOUNTER — Other Ambulatory Visit: Payer: Self-pay

## 2018-07-18 DIAGNOSIS — Z992 Dependence on renal dialysis: Secondary | ICD-10-CM | POA: Insufficient documentation

## 2018-07-18 DIAGNOSIS — I2510 Atherosclerotic heart disease of native coronary artery without angina pectoris: Secondary | ICD-10-CM | POA: Diagnosis not present

## 2018-07-18 DIAGNOSIS — Z794 Long term (current) use of insulin: Secondary | ICD-10-CM | POA: Diagnosis not present

## 2018-07-18 DIAGNOSIS — I132 Hypertensive heart and chronic kidney disease with heart failure and with stage 5 chronic kidney disease, or end stage renal disease: Secondary | ICD-10-CM | POA: Insufficient documentation

## 2018-07-18 DIAGNOSIS — Y998 Other external cause status: Secondary | ICD-10-CM | POA: Insufficient documentation

## 2018-07-18 DIAGNOSIS — W19XXXA Unspecified fall, initial encounter: Secondary | ICD-10-CM

## 2018-07-18 DIAGNOSIS — Z9861 Coronary angioplasty status: Secondary | ICD-10-CM | POA: Diagnosis not present

## 2018-07-18 DIAGNOSIS — Z87891 Personal history of nicotine dependence: Secondary | ICD-10-CM | POA: Insufficient documentation

## 2018-07-18 DIAGNOSIS — W010XXA Fall on same level from slipping, tripping and stumbling without subsequent striking against object, initial encounter: Secondary | ICD-10-CM | POA: Diagnosis not present

## 2018-07-18 DIAGNOSIS — N186 End stage renal disease: Secondary | ICD-10-CM | POA: Insufficient documentation

## 2018-07-18 DIAGNOSIS — Y9389 Activity, other specified: Secondary | ICD-10-CM | POA: Diagnosis not present

## 2018-07-18 DIAGNOSIS — E1122 Type 2 diabetes mellitus with diabetic chronic kidney disease: Secondary | ICD-10-CM | POA: Insufficient documentation

## 2018-07-18 DIAGNOSIS — Y92002 Bathroom of unspecified non-institutional (private) residence single-family (private) house as the place of occurrence of the external cause: Secondary | ICD-10-CM | POA: Insufficient documentation

## 2018-07-18 DIAGNOSIS — S3992XA Unspecified injury of lower back, initial encounter: Secondary | ICD-10-CM | POA: Insufficient documentation

## 2018-07-18 DIAGNOSIS — Z7982 Long term (current) use of aspirin: Secondary | ICD-10-CM | POA: Insufficient documentation

## 2018-07-18 DIAGNOSIS — Z79899 Other long term (current) drug therapy: Secondary | ICD-10-CM | POA: Insufficient documentation

## 2018-07-18 DIAGNOSIS — Z853 Personal history of malignant neoplasm of breast: Secondary | ICD-10-CM | POA: Diagnosis not present

## 2018-07-18 DIAGNOSIS — I509 Heart failure, unspecified: Secondary | ICD-10-CM | POA: Diagnosis not present

## 2018-07-18 DIAGNOSIS — Y92009 Unspecified place in unspecified non-institutional (private) residence as the place of occurrence of the external cause: Secondary | ICD-10-CM

## 2018-07-18 MED ORDER — TRAMADOL HCL 50 MG PO TABS: 50.0000 mg | ORAL_TABLET | Freq: Four times a day (QID) | ORAL | 0 refills | Status: DC | PRN

## 2018-07-18 MED ORDER — TRAMADOL HCL 50 MG PO TABS
100.0000 mg | ORAL_TABLET | Freq: Once | ORAL | Status: AC
  Administered 2018-07-18: 100 mg via ORAL
  Filled 2018-07-18: qty 2

## 2018-07-18 NOTE — ED Notes (Signed)
Pt d/c home with husband per MD order. Discharge summary reviewed, pt verbalizes understanding. Off unit via wheelchair.

## 2018-07-18 NOTE — ED Notes (Signed)
Patient transported to X-ray 

## 2018-07-18 NOTE — ED Notes (Signed)
Pt brought to room. Changed into gown, monitors applied and pt assessed. Reports pain all over from fall but specifically in back. Family at bedside.

## 2018-07-18 NOTE — ED Provider Notes (Signed)
[facility] DEPT Provider Note   CSN: [id number] Arrival date & time: 07/18/18  0554    History   Chief Complaint Chief Complaint  Patient presents with  . Fall    HPI Alexis [name] is a 73 y.o. female.     The history is provided by the patient and the spouse. No language interpreter was used.  Fall      73 year old female with history of chronic back pain, dialysis patient presenting for evaluation of fall.  Patient is mostly wheelchair-bound.  Yesterday she was trying to get onto the commode, lost balance, and fell to her left side.  She denies hitting her head or loss of consciousness.  She did call for help and when EMS arrived, she was able to stand up with assist.  She did not have any significant pain at that time and does not want to go to ED.  However, she is currently complaining of pain to her lower back.  She described pain as "hurt", 6 out of 10, nonradiating.  There is no associated headache, neck pain, focal numbness or weakness, saddle anesthesia, bowel bladder incontinence or leg weakness.  No specific treatment tried.  She does not think she has any broken bones.  She denies any pain to her abdomen or flank.  Past Medical History:  Diagnosis Date  . Arthritis   . Cancer [facility]) 2005    left breast  . CHF (congestive heart failure) ([place])   . Chronic back pain   . Chronic kidney disease 03/2014   dialysis t/th/sa  . Coronary artery disease   . Diabetes mellitus    Type 2  . Diabetic nephropathy ([place]) 01-08-13  . Dyslipidemia 01-08-13  . ESRD on hemodialysis ([place])    Tu, Th, Sat  . Headache   . Hyperlipidemia   . Hypertension   . LBBB (left bundle branch block)   . Proteinuria 01-08-13  . Pulmonary hypertension ([place])    PA peak pressure 73 mmHg 08/05/17 echo  . Thyroid disease 01-08-13   Hyper-parathyroidism-secondary  . Wears glasses     Patient Active Problem List   Diagnosis Date Noted  . Elevated troponin   . ESRD on  hemodialysis ([place])   . ESRD (end stage renal disease) ([place]) 08/13/2017  . Dyspnea 08/13/2017  . Chest pain 08/04/2017  . 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 Essential hypertension 08/15/2012  . CAD (coronary artery disease) 03/25/2011  . Type II diabetes mellitus with manifestations ([place]) 03/25/2011  . Chest pain 03/25/2011  . CHF (congestive heart failure) ([place]) 10/14/2010  . CKD (chronic kidney disease) 10/14/2010  . Obesity 10/14/2010    Past Surgical History:  Procedure Laterality Date  . A/V FISTULAGRAM N/A 08/25/2017   Procedure: A/V FISTULAGRAM - Left Arm;  Surgeon: Angelia Mould, MD;  Location: [place] CV LAB;  Service: Cardiovascular;  Laterality: N/A;  . A/V FISTULAGRAM N/A 02/09/2018   Procedure: A/V [id number];  Surgeon: Elam Dutch, MD;  Location: [place] CV LAB;  Service: Cardiovascular;  Laterality: N/A;  . A/V FISTULAGRAM Left 04/04/2018   Procedure: A/V FISTULAGRAM;  Surgeon: Marty Heck, MD;  Location: [place] CV LAB;  Service: Cardiovascular;  Laterality: Left;  . ABDOMINAL AORTAGRAM N/A 08/11/2014   Procedure: ABDOMINAL Maxcine Ham;  Surgeon: Angelia Mould, MD;  Location: [facility] CATH LAB;  Service: Cardiovascular;  Laterality: N/A;  . ABDOMINAL HYSTERECTOMY    . APPENDECTOMY    .  Arm surgery     Left arm trauma  . AV FISTULA PLACEMENT Left 08/21/2014   Procedure: LEFT ARM ARTERIOVENOUS (AV) FISTULA CREATION ;  Surgeon: Angelia Mould, MD;  Location: [facility] OR;  Service: Vascular;  Laterality: Left;  . AV FISTULA PLACEMENT Right 06/13/2018   Procedure: insertion of right arm ARTERIOVENOUS (AV) gore-tex GRAFT;  Surgeon: Rosetta Posner, MD;  Location: [facility];  Service: Vascular;  Laterality: Right;  . BACK SURGERY    . CATARACT EXTRACTION W/PHACO Left 12/22/2014   Procedure: CATARACT EXTRACTION PHACO AND INTRAOCULAR LENS PLACEMENT (IOC);  Surgeon: Tonny Branch, MD;  [facility]: [facility];  Service:  Ophthalmology;  Laterality: Left;  CDE 13.48  . CORONARY ANGIOPLASTY  12/19/2003   inferior wall hypokinesis. ef 50%  . dialysis catheter    . INSERTION OF DIALYSIS CATHETER Right 06/13/2018   Procedure: INSERTION OF DIALYSIS CATHETER, right internal jugular;  Surgeon: Rosetta Posner, MD;  Location: [facility];  Service: Vascular;  Laterality: Right;  . IR FLUORO GUIDE CV LINE RIGHT  04/06/2018  . IR REMOVAL TUN CV CATH W/O FL  04/25/2018  . IR US GUIDE VASC ACCESS RIGHT  04/06/2018  . MASTECTOMY     Left  . PERIPHERAL VASCULAR BALLOON ANGIOPLASTY Left 08/25/2017   Procedure: PERIPHERAL VASCULAR BALLOON ANGIOPLASTY;  Surgeon: Angelia Mould, MD;  Location: [place] CV LAB;  Service: Cardiovascular;  Laterality: Left;  arm fistula  . PERIPHERAL VASCULAR BALLOON ANGIOPLASTY Left 02/09/2018   Procedure: PERIPHERAL VASCULAR BALLOON ANGIOPLASTY;  Surgeon: Elam Dutch, MD;  Location: [place] CV LAB;  Service: Cardiovascular;  Laterality: Left;  AV Fistula  . PERIPHERAL VASCULAR BALLOON ANGIOPLASTY Left 04/04/2018   Procedure: PERIPHERAL VASCULAR BALLOON ANGIOPLASTY;  Surgeon: Marty Heck, MD;  Location: [place] CV LAB;  Service: Cardiovascular;  Laterality: Left;  UPPER ARM FISTULA  . PERIPHERAL VASCULAR CATHETERIZATION N/A 09/16/2015   Procedure: Fistulagram;  Surgeon: Rosetta Posner, MD;  Location: [place] CV LAB;  Service: Cardiovascular;  Laterality: N/A;  . SPINE SURGERY    . TONSILLECTOMY    . TRANSTHORACIC ECHOCARDIOGRAM  10/01/2010    Left ventricle: The cavity size was mildly dilated. Wall thickness was increased in a pattern of mild LVH. Systolic function was   mildly reduced. The estimated ejection fraction was in the range  of 45% to 50%.   . TUBAL LIGATION       OB History   No obstetric history on file.      Home Medications    Prior to Admission medications   Medication Sig Start Date End Date Taking? Authorizing Provider  aspirin EC 81 MG EC tablet  Take 1 tablet (81 mg total) by mouth daily. 08/05/17  Yes Osei-Bonsu, Iona Beard, MD  AURYXIA 1 GM 210 MG(Fe) tablet Take 420 mg by mouth 3 (three) times daily with meals. Take 2 tablets (420 mg) by mouth 3 times daily with meals & take 1 tablet (210 mg) by mouth once daily with a snack. 07/25/17  Yes [provider]  calcitRIOL (ROCALTROL) 0.25 MCG capsule Take 0.25 mcg by mouth 3 (three) times a week. On Dialysis days Harrell Lark, Sat. 03/26/14  Yes [provider]  carvedilol (COREG) 6.25 MG tablet Take 6.25 mg by mouth 2 (two) times daily. 01/17/18  Yes [provider]  diclofenac sodium (VOLTAREN) 1 % GEL Apply 1 application topically daily as needed (pain.).    Yes [provider]  docusate sodium (COLACE) 100  MG capsule Take 100 mg by mouth every other day.   Yes [provider]  gabapentin (NEURONTIN) 100 MG capsule Take 100 mg by mouth at bedtime and may repeat dose one time if needed.  01/17/18  Yes [provider]  hydrALAZINE (APRESOLINE) 100 MG tablet Take 100 mg by mouth 3 (three) times daily.   Yes [provider]  isosorbide mononitrate (IMDUR) 30 MG 24 hr tablet Take 30 mg by mouth daily.   Yes [provider]  lidocaine-prilocaine (EMLA) cream Apply 1 application topically daily as needed (prior to port being accessed).    Yes [provider]  nitroGLYCERIN (NITROSTAT) 0.4 MG SL tablet Place 0.4 mg under the tongue every 5 (five) minutes as needed for chest pain.   Yes [provider]  NOVOLOG MIX 70/30 FLEXPEN (70-30) 100 UNIT/ML FlexPen Inject 55 Units into the skin daily at 6 PM. 1900 01/17/18  Yes [provider]  polyethylene glycol (MIRALAX / GLYCOLAX) packet Take 17 g by mouth daily as needed for mild constipation.    Yes [provider]  sevelamer carbonate (RENVELA) 800 MG tablet Take 1,600 mg by mouth 3 (three) times daily with meals.   Yes [provider]  tiZANidine  (ZANAFLEX) 4 MG tablet Take 4 mg by mouth at bedtime as needed for muscle spasms. 08/22/17  Yes [provider]  HYDROcodone-acetaminophen (NORCO/VICODIN) 5-325 MG tablet Take 2 tablets by mouth every 6 (six) hours as needed for moderate pain or severe pain. Patient not taking: Reported on 07/18/2018 06/13/18   Ulyses Amor, PA-C    Family History Family History  Problem Relation Age of Onset  . Breast cancer Mother        Died age 30  . Cancer Mother 34       Breast  . Heart disease Mother   . Hyperlipidemia Mother   . Hypertension Mother   . Heart attack Mother     Social History Social History   Tobacco Use  . Smoking status: Former Smoker    Packs/day: 1.00    Years: 50.00    Pack years: 50.00    Types: Cigarettes    Last attempt to quit: 05/16/1998    Years since quitting: 20.1  . Smokeless tobacco: Never Used  Substance Use Topics  . Alcohol use: No    Alcohol/week: 0.0 standard drinks  . Drug use: No     Allergies   Olmesartan   Review of Systems Review of Systems  All other systems reviewed and are negative.    Physical Exam Updated Vital Signs BP (!) 158/67 (BP Location: Left Wrist)   Pulse 94   Temp 98.3 F (36.8 C) (Oral)   Resp 19   Ht 5\' 6"  (1.676 m)   Wt 107.5 kg   SpO2 97%   BMI 38.25 kg/m   Physical Exam Vitals signs and nursing note reviewed.  Constitutional:      General: She is not in acute distress.    Appearance: She is well-developed. She is obese.  HENT:     Head: Atraumatic.  Eyes:     Conjunctiva/sclera: Conjunctivae normal.  Neck:     Musculoskeletal: Neck supple.  Abdominal:     Palpations: Abdomen is soft.     Tenderness: There is no abdominal tenderness.  Musculoskeletal:        General: Tenderness (Tenderness to lumbar spine without crepitus step-off.  No paraspinal muscle tenderness and no flank tenderness) present.  Skin:    Findings: No rash.  Neurological:     Mental Status: She is alert.      Comments: Able to raise both legs with poor effort.  Patellar deep tendon reflex intact bilaterally.      ED Treatments / Results  Labs (all labs ordered are listed, but only abnormal results are displayed) Labs Reviewed - No data to display  EKG None  Radiology Dg Lumbar Spine Complete  Result Date: 07/18/2018 CLINICAL DATA:  Recent fall with low back pain, initial encounter EXAM: LUMBAR SPINE - COMPLETE 4+ VIEW COMPARISON:  None. FINDINGS: Five lumbar vertebra are noted. The fifth lumbar vertebra is partially sacralized. Multilevel disc space narrowing is noted but worst at L3-4 and L4-5. Facet hypertrophic changes are noted. No anterolisthesis is noted. Diffuse vascular calcifications are seen. IMPRESSION: Degenerative change without acute abnormality. Electronically Signed   By: Inez Catalina M.D.   On: 07/18/2018 07:40    Procedures Procedures (including critical care time)  Medications Ordered in ED Medications  traMADol (ULTRAM) tablet 100 mg (100 mg Oral Given 07/18/18 0748)     Initial Impression / Assessment and Plan / ED Course  I have reviewed the triage vital signs and the nursing notes.  Pertinent labs & imaging results that were available during my care of the patient were reviewed by me and considered in my medical decision making (see chart for details).        BP (!) 158/81   Pulse 89   Temp 98.3 F (36.8 C) (Oral)   Resp 18   Ht 5\' 6"  (1.676 m)   Wt 107.5 kg   SpO2 97%   BMI 38.25 kg/m    Final Clinical Impressions(s) / ED Diagnoses   Final diagnoses:  Fall in home, initial encounter  Lower back injury, initial encounter    ED Discharge Orders         Ordered    traMADol (ULTRAM) 50 MG tablet  Every 6 hours PRN     07/18/18 0830         6:51 AM Obese patient with history of chronic pain who had a mechanical fall and hurt her lower back from the fall yesterday.  No red flags.  L-spine x-rays ordered.  Pain medication given.  8:27  AM Xray neg for acute fracture/dislocation.  Degenerative changes noted.  RICE therapy discussed.     Domenic Moras, PA-C 07/18/18 0831    Molpus, Jenny Reichmann, MD 07/18/18 2232

## 2018-07-18 NOTE — ED Triage Notes (Signed)
Patient had a fall yesterday. Patient states she was trying to use the bathroom and fell between the tub and the toilet. Patient states she is hurting all over from the fall.

## 2018-07-20 ENCOUNTER — Encounter (HOSPITAL_COMMUNITY): Payer: Self-pay | Admitting: Physician Assistant

## 2018-07-20 ENCOUNTER — Ambulatory Visit (HOSPITAL_COMMUNITY)
Admission: RE | Admit: 2018-07-20 | Discharge: 2018-07-20 | Disposition: A | Discharge status: Home / Self Care | Payer: Medicare Other | Source: Ambulatory Visit | Attending: Nephrology | Admitting: Nephrology

## 2018-07-20 DIAGNOSIS — Z452 Encounter for adjustment and management of vascular access device: Secondary | ICD-10-CM | POA: Insufficient documentation

## 2018-07-20 DIAGNOSIS — Z992 Dependence on renal dialysis: Secondary | ICD-10-CM | POA: Insufficient documentation

## 2018-07-20 DIAGNOSIS — N186 End stage renal disease: Secondary | ICD-10-CM | POA: Diagnosis not present

## 2018-07-20 HISTORY — PX: IR REMOVAL TUN CV CATH W/O FL: IMG2289

## 2018-07-20 MED ORDER — LIDOCAINE HCL 1 % IJ SOLN
INTRAMUSCULAR | Status: AC | PRN
  Administered 2018-07-20: 5 mL

## 2018-07-20 MED ORDER — CHLORHEXIDINE GLUCONATE 4 % EX LIQD
CUTANEOUS | Status: AC
  Filled 2018-07-20: qty 15

## 2018-07-20 MED ORDER — LIDOCAINE HCL 1 % IJ SOLN
INTRAMUSCULAR | Status: AC
  Filled 2018-07-20: qty 20

## 2018-07-20 NOTE — Procedures (Signed)
Pre procedural Dx: ESRD Post procedural Dx: Same  Successful removal of tunneled HD catheter  EBL: <1 mL No immediate complications.  Please see imaging section of Epic for full dictation.  Joaquim Nam PA-C 07/20/2018 11:00 AM

## 2018-11-08 DIAGNOSIS — S42291A Other displaced fracture of upper end of right humerus, initial encounter for closed fracture: Secondary | ICD-10-CM | POA: Insufficient documentation

## 2019-01-23 DIAGNOSIS — S81801A Unspecified open wound, right lower leg, initial encounter: Secondary | ICD-10-CM | POA: Insufficient documentation

## 2019-03-06 DIAGNOSIS — I721 Aneurysm of artery of upper extremity: Secondary | ICD-10-CM | POA: Insufficient documentation

## 2019-03-11 ENCOUNTER — Other Ambulatory Visit: Payer: Self-pay

## 2019-03-11 ENCOUNTER — Other Ambulatory Visit: Payer: Self-pay | Admitting: *Deleted

## 2019-03-11 ENCOUNTER — Encounter: Payer: Self-pay | Admitting: *Deleted

## 2019-03-11 ENCOUNTER — Encounter: Payer: Self-pay | Admitting: Family

## 2019-03-11 ENCOUNTER — Ambulatory Visit (INDEPENDENT_AMBULATORY_CARE_PROVIDER_SITE_OTHER): Payer: Medicare Other | Admitting: Family

## 2019-03-11 VITALS — BP 138/80 | HR 86 | Temp 96.7°F | Resp 14 | Ht 66.0 in | Wt 227.0 lb

## 2019-03-11 DIAGNOSIS — Z992 Dependence on renal dialysis: Secondary | ICD-10-CM | POA: Diagnosis not present

## 2019-03-11 DIAGNOSIS — N186 End stage renal disease: Secondary | ICD-10-CM

## 2019-03-11 DIAGNOSIS — T82590A Other mechanical complication of surgically created arteriovenous fistula, initial encounter: Secondary | ICD-10-CM

## 2019-03-11 NOTE — Progress Notes (Signed)
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 (1946-01-19) female who is s/p left cephalic vein outflow stenosis angioplasty on 04-04-18, angioplasty of venous outflow stenosis on 02-09-18, Venoplasty of left brachiocephalic fistula on 0-96-04, and right IJ tunnel catheter with SonoSite visualization and  right forearm loop AV Gore-Tex graft insertion on 06-13-18 by Dr. Donnetta Hutching.  Pt dialyzes on TTS  via left forearm AVG at [facility] in [place]. She no longer has a TDC.   Pt states no problems with her right arm AVG, no problems during hemodialysis. She denies any steal sx's in either upper extremity.    The patient is right hand dominant.    Pt returns today at the request of Dr. Remo Lipps Case for evaluation of aneurysmal left arm access. Pt states the distal aspect of her left arm access (that is no longer used for dialysis) is painful, and that the distal aneurysmal section is getting larger.   She states that she is seeing an orthopod due to falling on her right arm. She states that she walks with a walker at home.     Past Medical History:  Diagnosis Date  . Arthritis   . Cancer [facility]) 2005    left breast  . CHF (congestive heart failure) ([place])   . Chronic back pain   . Chronic kidney disease 03/2014   dialysis t/th/sa  . Coronary artery disease   . Diabetes mellitus    Type 2  . Diabetic nephropathy ([place]) 01-08-13  . Dyslipidemia 01-08-13  . ESRD on hemodialysis ([place] [address])    Tu, Th, Sat  . Headache   . Hyperlipidemia   . Hypertension   . LBBB (left bundle branch block)   . Proteinuria 01-08-13  . Pulmonary hypertension ([place])    PA peak pressure 73 mmHg 08/05/17 echo  . Thyroid disease 01-08-13   Hyper-parathyroidism-secondary  . Wears glasses     Social History Social History   Tobacco Use  . Smoking status: Former Smoker    Packs/day: 1.00    Years: 50.00     Pack years: 50.00    Types: Cigarettes    Quit date: 05/16/1998    Years since quitting: 20.8  . Smokeless tobacco: Never Used  Substance Use Topics  . Alcohol use: No    Alcohol/week: 0.0 standard drinks  . Drug use: No    Family History Family History  Problem Relation Age of Onset  . Breast cancer Mother        Died age 23  . Cancer Mother 62       Breast  . Heart disease Mother   . Hyperlipidemia Mother   . Hypertension Mother   . Heart attack Mother     Surgical History Past Surgical History:  Procedure Laterality Date  . A/V FISTULAGRAM N/A 08/25/2017   Procedure: A/V FISTULAGRAM - Left Arm;  Surgeon: Angelia Mould, MD;  Location: [place] CV LAB;  Service: Cardiovascular;  Laterality: N/A;  . A/V FISTULAGRAM N/A 02/09/2018   Procedure: A/V [id number];  Surgeon: Elam Dutch, MD;  Location: [place] CV LAB;  Service: Cardiovascular;  Laterality: N/A;  . A/V FISTULAGRAM Left 04/04/2018   Procedure: A/V FISTULAGRAM;  Surgeon: Marty Heck, MD;  Location: [place] CV LAB;  Service: Cardiovascular;  Laterality: Left;  . ABDOMINAL AORTAGRAM N/A 08/11/2014  Procedure: ABDOMINAL AORTAGRAM;  Surgeon: Angelia Mould, MD;  Location: [facility] CATH LAB;  Service: Cardiovascular;  Laterality: N/A;  . ABDOMINAL HYSTERECTOMY    . APPENDECTOMY    . Arm surgery     Left arm trauma  . AV FISTULA PLACEMENT Left 08/21/2014   Procedure: LEFT ARM ARTERIOVENOUS (AV) FISTULA CREATION ;  Surgeon: Angelia Mould, MD;  Location: [facility] OR;  Service: Vascular;  Laterality: Left;  . AV FISTULA PLACEMENT Right 06/13/2018   Procedure: insertion of right arm ARTERIOVENOUS (AV) gore-tex GRAFT;  Surgeon: Rosetta Posner, MD;  Location: [place];  Service: Vascular;  Laterality: Right;  . BACK SURGERY    . CATARACT EXTRACTION W/PHACO Left 12/22/2014   Procedure: CATARACT EXTRACTION PHACO AND INTRAOCULAR LENS PLACEMENT (IOC);  Surgeon: Tonny Branch, MD;  [facility];   Service: Ophthalmology;  Laterality: Left;  CDE 13.48  . CORONARY ANGIOPLASTY  12/19/2003   inferior wall hypokinesis. ef 50%  . dialysis catheter    . INSERTION OF DIALYSIS CATHETER Right 06/13/2018   Procedure: INSERTION OF DIALYSIS CATHETER, right internal jugular;  Surgeon: Rosetta Posner, MD;  Location: [place];  Service: Vascular;  Laterality: Right;  . IR FLUORO GUIDE CV LINE RIGHT  04/06/2018  . IR REMOVAL TUN CV CATH W/O FL  04/25/2018  . IR REMOVAL TUN CV CATH W/O FL  07/20/2018  . IR US GUIDE VASC ACCESS RIGHT  04/06/2018  . MASTECTOMY     Left  . PERIPHERAL VASCULAR BALLOON ANGIOPLASTY Left 08/25/2017   Procedure: PERIPHERAL VASCULAR BALLOON ANGIOPLASTY;  Surgeon: Angelia Mould, MD;  Location: [place] CV LAB;  Service: Cardiovascular;  Laterality: Left;  arm fistula  . PERIPHERAL VASCULAR BALLOON ANGIOPLASTY Left 02/09/2018   Procedure: PERIPHERAL VASCULAR BALLOON ANGIOPLASTY;  Surgeon: Elam Dutch, MD;  Location: [place] CV LAB;  Service: Cardiovascular;  Laterality: Left;  AV Fistula  . PERIPHERAL VASCULAR BALLOON ANGIOPLASTY Left 04/04/2018   Procedure: PERIPHERAL VASCULAR BALLOON ANGIOPLASTY;  Surgeon: Marty Heck, MD;  Location: [place] CV LAB;  Service: Cardiovascular;  Laterality: Left;  UPPER ARM FISTULA  . PERIPHERAL VASCULAR CATHETERIZATION N/A 09/16/2015   Procedure: Fistulagram;  Surgeon: Rosetta Posner, MD;  Location: [place] CV LAB;  Service: Cardiovascular;  Laterality: N/A;  . SPINE SURGERY    . TONSILLECTOMY    . TRANSTHORACIC ECHOCARDIOGRAM  10/01/2010    Left ventricle: The cavity size was mildly dilated. Wall thickness was increased in a pattern of mild LVH. Systolic function was   mildly reduced. The estimated ejection fraction was in the range  of 45% to 50%.   . TUBAL LIGATION      Allergies  Allergen Reactions  . Olmesartan Other (See Comments)    Hyperkalemia Hyperkalemia    Current Outpatient Medications  Medication Sig  Dispense Refill  . aspirin EC 81 MG EC tablet Take 1 tablet (81 mg total) by mouth daily. 30 tablet 0  . AURYXIA 1 GM 210 MG(Fe) tablet Take 420 mg by mouth 3 (three) times daily with meals. Take 2 tablets (420 mg) by mouth 3 times daily with meals & take 1 tablet (210 mg) by mouth once daily with a snack.  0  . calcitRIOL (ROCALTROL) 0.25 MCG capsule Take 0.25 mcg by mouth 3 (three) times a week. On Dialysis days Tues, Thur, Sat.    . carvedilol (COREG) 6.25 MG tablet Take 6.25 mg by mouth 2 (two) times daily.  11  . diclofenac  sodium (VOLTAREN) 1 % GEL Apply 1 application topically daily as needed (pain.).     Marland Kitchen docusate sodium (COLACE) 100 MG capsule Take 100 mg by mouth every other day.    . gabapentin (NEURONTIN) 100 MG capsule Take 100 mg by mouth at bedtime and may repeat dose one time if needed.   3  . hydrALAZINE (APRESOLINE) 100 MG tablet Take 100 mg by mouth 3 (three) times daily.    . isosorbide mononitrate (IMDUR) 30 MG 24 hr tablet Take 30 mg by mouth daily.    Marland Kitchen lidocaine-prilocaine (EMLA) cream Apply 1 application topically daily as needed (prior to port being accessed).     . nitroGLYCERIN (NITROSTAT) 0.4 MG SL tablet Place 0.4 mg under the tongue every 5 (five) minutes as needed for chest pain.    Marland Kitchen NOVOLOG MIX 70/30 FLEXPEN (70-30) 100 UNIT/ML FlexPen Inject 55 Units into the skin daily at 6 PM. 1900  11  . polyethylene glycol (MIRALAX / GLYCOLAX) packet Take 17 g by mouth daily as needed for mild constipation.     . sevelamer carbonate (RENVELA) 800 MG tablet Take 1,600 mg by mouth 3 (three) times daily with meals.    Marland Kitchen tiZANidine (ZANAFLEX) 4 MG tablet Take 4 mg by mouth at bedtime as needed for muscle spasms.  2  . HYDROcodone-acetaminophen (NORCO/VICODIN) 5-325 MG tablet Take 2 tablets by mouth every 6 (six) hours as needed for moderate pain or severe pain. (Patient not taking: Reported on 03/11/2019) 6 tablet 0  . traMADol (ULTRAM) 50 MG tablet Take 1 tablet (50 mg total) by  mouth every 6 (six) hours as needed for moderate pain. (Patient not taking: Reported on 03/11/2019) 6 tablet 0   Current Facility-Administered Medications  Medication Dose Route Frequency Provider Last Rate Last Dose  . lidocaine (PF) (XYLOCAINE) 1 % injection 0.3 mL  0.3 mL Other Once Magnus Sinning, MD         REVIEW OF SYSTEMS: see HPI for pertinent positives and negatives    PHYSICAL EXAMINATION:  Vitals:   03/11/19 1004  BP: 138/80  Pulse: 86  Resp: 14  Temp: (!) 96.7 F (35.9 C)  TempSrc: Temporal  SpO2: 97%  Weight: 227 lb (103 kg)  Height: 5\' 6"  (1.676 m)   Body mass index is 36.64 kg/m.  General: Obese female seated in a w/c HEENT:  No gross abnormalities Pulmonary: Respirations are non-labored, adequate air movement in all fields, no rales, rhonchi, or wheezes Abdomen: Softly obese and non-tender with normal bowel sounds. Musculoskeletal: There are no major deformities.   Neurologic: No focal weakness or paresthesias are detected, bilateral hand grip strength is 4/5 Skin: There are no ulcer or rashes noted. Psychiatric: The patient has normal affect. Cardiovascular: There is a regular rate and rhythm without significant murmur appreciated.  Right a forearm AV graft with + bruit and pulsatile.  Left forearm AVF (that is no longer used for hemodialysis) has a small to moderate sized aneurysm at the distal aspect of the AVF, is pulsatile, skin is not thin of the aneurysmal section.  Left arm AVF aneurysmal section is not tender to touch.  Right radial pulse is not palpable, left radial pulse is 1+ palpable.    Medical Decision Making  KEZIYAH KNEALE is a 73 y.o. 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 The skin is not thin over  the aneurysmal section, and the aneurysmal section is not painful to touch.  She dialyses via a right forearm AV graft,  has no problems with the graft, no problems during dialysis, no steal symptoms in either upper extremities.  Pt states she wants to get rid of the aneurysm at her left arm access that is no longer used.   I discussed the above with Dr. Trula Slade.  Will schedule pt for ligation of the LEFT upper arm AVF that is no longer used for hemodialysis.    Clemon Chambers, RN, MSN, FNP-C [facility] Office: [phone]  03/11/2019, 10:23 AM  Clinic MD: Trula Slade

## 2019-03-11 NOTE — H&P (View-Only) (Signed)
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 ([birth year]) female who is s/p left cephalic vein outflow stenosis angioplasty on 04-04-18, angioplasty of venous outflow stenosis on 02-09-18, Venoplasty of left brachiocephalic fistula on 4-94-49, and right IJ tunnel catheter with SonoSite visualization and  right forearm loop AV Gore-Tex graft insertion on 06-13-18 by Dr. Donnetta Hutching.  Pt dialyzes on TTS  via left forearm AVG at [facility] in [place]. She no longer has a TDC.   Pt states no problems with her right arm AVG, no problems during hemodialysis. She denies any steal sx's in either upper extremity.    The patient is right hand dominant.    Pt returns today at the request of Dr. Remo Lipps Case for evaluation of aneurysmal left arm access. Pt states the distal aspect of her left arm access (that is no longer used for dialysis) is painful, and that the distal aneurysmal section is getting larger.   She states that she is seeing an orthopod due to falling on her right arm. She states that she walks with a walker at home.     Past Medical History:  Diagnosis Date  . Arthritis   . Cancer [facility]) 2005    left breast  . CHF (congestive heart failure) ([place])   . Chronic back pain   . Chronic kidney disease 03/2014   dialysis t/th/sa  . Coronary artery disease   . Diabetes mellitus    Type 2  . Diabetic nephropathy ([place]) 01-08-13  . Dyslipidemia 01-08-13  . ESRD on hemodialysis ([place])    Tu, Th, Sat  . Headache   . Hyperlipidemia   . Hypertension   . LBBB (left bundle branch block)   . Proteinuria 01-08-13  . Pulmonary hypertension ([place])    PA peak pressure 73 mmHg 08/05/17 echo  . Thyroid disease 01-08-13   Hyper-parathyroidism-secondary  . Wears glasses     Social History Social History   Tobacco Use  . Smoking status: Former Smoker    Packs/day: 1.00    Years: 50.00     Pack years: 50.00    Types: Cigarettes    Quit date: 05/16/1998    Years since quitting: 20.8  . Smokeless tobacco: Never Used  Substance Use Topics  . Alcohol use: No    Alcohol/week: 0.0 standard drinks  . Drug use: No    Family History Family History  Problem Relation Age of Onset  . Breast cancer Mother        Died age 34  . Cancer Mother 31       Breast  . Heart disease Mother   . Hyperlipidemia Mother   . Hypertension Mother   . Heart attack Mother     Surgical History Past Surgical History:  Procedure Laterality Date  . A/V FISTULAGRAM N/A 08/25/2017   Procedure: A/V FISTULAGRAM - Left Arm;  Surgeon: Angelia Mould, MD;  Location: [place] [facility];  Service: Cardiovascular;  Laterality: N/A;  . A/V FISTULAGRAM N/A 02/09/2018   Procedure: A/V [id number];  Surgeon: Elam Dutch, MD;  Location: [place] [facility];  Service: Cardiovascular;  Laterality: N/A;  . A/V FISTULAGRAM Left 04/04/2018   Procedure: A/V FISTULAGRAM;  Surgeon: Marty Heck, MD;  Location: [place] [facility];  Service: Cardiovascular;  Laterality: Left;  . ABDOMINAL AORTAGRAM N/A 08/11/2014  Procedure: ABDOMINAL AORTAGRAM;  Surgeon: Angelia Mould, MD;  Location: [facility] CATH LAB;  Service: Cardiovascular;  Laterality: N/A;  . ABDOMINAL HYSTERECTOMY    . APPENDECTOMY    . Arm surgery     Left arm trauma  . AV FISTULA PLACEMENT Left 08/21/2014   Procedure: LEFT ARM ARTERIOVENOUS (AV) FISTULA CREATION ;  Surgeon: Angelia Mould, MD;  Location: [facility] OR;  Service: Vascular;  Laterality: Left;  . AV FISTULA PLACEMENT Right 06/13/2018   Procedure: insertion of right arm ARTERIOVENOUS (AV) gore-tex GRAFT;  Surgeon: Rosetta Posner, MD;  Location: [place];  Service: Vascular;  Laterality: Right;  . BACK SURGERY    . CATARACT EXTRACTION W/PHACO Left 12/22/2014   Procedure: CATARACT EXTRACTION PHACO AND INTRAOCULAR LENS PLACEMENT (IOC);  Surgeon: Tonny Branch, MD;  Location: [facility];   Service: Ophthalmology;  Laterality: Left;  CDE 13.48  . CORONARY ANGIOPLASTY  12/19/2003   inferior wall hypokinesis. ef 50%  . dialysis catheter    . INSERTION OF DIALYSIS CATHETER Right 06/13/2018   Procedure: INSERTION OF DIALYSIS CATHETER, right internal jugular;  Surgeon: Rosetta Posner, MD;  Location: [place];  Service: Vascular;  Laterality: Right;  . IR FLUORO GUIDE CV LINE RIGHT  04/06/2018  . IR REMOVAL TUN CV CATH W/O FL  04/25/2018  . IR REMOVAL TUN CV CATH W/O FL  07/20/2018  . IR US GUIDE VASC ACCESS RIGHT  04/06/2018  . MASTECTOMY     Left  . PERIPHERAL VASCULAR BALLOON ANGIOPLASTY Left 08/25/2017   Procedure: PERIPHERAL VASCULAR BALLOON ANGIOPLASTY;  Surgeon: Angelia Mould, MD;  Location: [place] [facility];  Service: Cardiovascular;  Laterality: Left;  arm fistula  . PERIPHERAL VASCULAR BALLOON ANGIOPLASTY Left 02/09/2018   Procedure: PERIPHERAL VASCULAR BALLOON ANGIOPLASTY;  Surgeon: Elam Dutch, MD;  Location: [place] [facility];  Service: Cardiovascular;  Laterality: Left;  AV Fistula  . PERIPHERAL VASCULAR BALLOON ANGIOPLASTY Left 04/04/2018   Procedure: PERIPHERAL VASCULAR BALLOON ANGIOPLASTY;  Surgeon: Marty Heck, MD;  Location: [place] [facility];  Service: Cardiovascular;  Laterality: Left;  UPPER ARM FISTULA  . PERIPHERAL VASCULAR CATHETERIZATION N/A 09/16/2015   Procedure: Fistulagram;  Surgeon: Rosetta Posner, MD;  Location: [place] [facility];  Service: Cardiovascular;  Laterality: N/A;  . SPINE SURGERY    . TONSILLECTOMY    . TRANSTHORACIC ECHOCARDIOGRAM  10/01/2010    Left ventricle: The cavity size was mildly dilated. Wall thickness was increased in a pattern of mild LVH. Systolic function was   mildly reduced. The estimated ejection fraction was in the range  of 45% to 50%.   . TUBAL LIGATION      Allergies  Allergen Reactions  . Olmesartan Other (See Comments)    Hyperkalemia Hyperkalemia    Current Outpatient Medications  Medication Sig  Dispense Refill  . aspirin EC 81 MG EC tablet Take 1 tablet (81 mg total) by mouth daily. 30 tablet 0  . AURYXIA 1 GM 210 MG(Fe) tablet Take 420 mg by mouth 3 (three) times daily with meals. Take 2 tablets (420 mg) by mouth 3 times daily with meals & take 1 tablet (210 mg) by mouth once daily with a snack.  0  . calcitRIOL (ROCALTROL) 0.25 MCG capsule Take 0.25 mcg by mouth 3 (three) times a week. On Dialysis days Tues, Thur, Sat.    . carvedilol (COREG) 6.25 MG tablet Take 6.25 mg by mouth 2 (two) times daily.  11  . diclofenac  sodium (VOLTAREN) 1 % GEL Apply 1 application topically daily as needed (pain.).     Marland Kitchen docusate sodium (COLACE) 100 MG capsule Take 100 mg by mouth every other day.    . gabapentin (NEURONTIN) 100 MG capsule Take 100 mg by mouth at bedtime and may repeat dose one time if needed.   3  . hydrALAZINE (APRESOLINE) 100 MG tablet Take 100 mg by mouth 3 (three) times daily.    . isosorbide mononitrate (IMDUR) 30 MG 24 hr tablet Take 30 mg by mouth daily.    Marland Kitchen lidocaine-prilocaine (EMLA) cream Apply 1 application topically daily as needed (prior to port being accessed).     . nitroGLYCERIN (NITROSTAT) 0.4 MG SL tablet Place 0.4 mg under the tongue every 5 (five) minutes as needed for chest pain.    Marland Kitchen NOVOLOG MIX 70/30 FLEXPEN (70-30) 100 UNIT/ML FlexPen Inject 55 Units into the skin daily at 6 PM. 1900  11  . polyethylene glycol (MIRALAX / GLYCOLAX) packet Take 17 g by mouth daily as needed for mild constipation.     . sevelamer carbonate (RENVELA) 800 MG tablet Take 1,600 mg by mouth 3 (three) times daily with meals.    Marland Kitchen tiZANidine (ZANAFLEX) 4 MG tablet Take 4 mg by mouth at bedtime as needed for muscle spasms.  2  . HYDROcodone-acetaminophen (NORCO/VICODIN) 5-325 MG tablet Take 2 tablets by mouth every 6 (six) hours as needed for moderate pain or severe pain. (Patient not taking: Reported on 03/11/2019) 6 tablet 0  . traMADol (ULTRAM) 50 MG tablet Take 1 tablet (50 mg total) by  mouth every 6 (six) hours as needed for moderate pain. (Patient not taking: Reported on 03/11/2019) 6 tablet 0   Current Facility-Administered Medications  Medication Dose Route Frequency Provider Last Rate Last Dose  . lidocaine (PF) (XYLOCAINE) 1 % injection 0.3 mL  0.3 mL Other Once Magnus Sinning, MD         REVIEW OF SYSTEMS: see HPI for pertinent positives and negatives    PHYSICAL EXAMINATION:  Vitals:   03/11/19 1004  BP: 138/80  Pulse: 86  Resp: 14  Temp: (!) 96.7 F (35.9 C)  TempSrc: Temporal  SpO2: 97%  Weight: 227 lb (103 kg)  Height: 5\' 6"  (1.676 m)   Body mass index is 36.64 kg/m.  General: Obese female seated in a w/c HEENT:  No gross abnormalities Pulmonary: Respirations are non-labored, adequate air movement in all fields, no rales, rhonchi, or wheezes Abdomen: Softly obese and non-tender with normal bowel sounds. Musculoskeletal: There are no major deformities.   Neurologic: No focal weakness or paresthesias are detected, bilateral hand grip strength is 4/5 Skin: There are no ulcer or rashes noted. Psychiatric: The patient has normal affect. Cardiovascular: There is a regular rate and rhythm without significant murmur appreciated.  Right a forearm AV graft with + bruit and pulsatile.  Left forearm AVF (that is no longer used for hemodialysis) has a small to moderate sized aneurysm at the distal aspect of the AVF, is pulsatile, skin is not thin of the aneurysmal section.  Left arm AVF aneurysmal section is not tender to touch.  Right radial pulse is not palpable, left radial pulse is 1+ palpable.    Medical Decision Making  Alexis [name] is a 73 y.o. 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 The skin is not thin over  the aneurysmal section, and the aneurysmal section is not painful to touch.  She dialyses via a right forearm AV graft,  has no problems with the graft, no problems during dialysis, no steal symptoms in either upper extremities.  Pt states she wants to get rid of the aneurysm at her left arm access that is no longer used.   I discussed the above with Dr. Trula Slade.  Will schedule pt for ligation of the LEFT upper arm AVF that is no longer used for hemodialysis.    Clemon Chambers, RN, MSN, FNP-C [facility] Office: [phone]  03/11/2019, 10:23 AM  Clinic MD: Trula Slade

## 2019-03-14 ENCOUNTER — Other Ambulatory Visit: Payer: Self-pay

## 2019-03-14 ENCOUNTER — Other Ambulatory Visit (HOSPITAL_COMMUNITY)
Admission: RE | Admit: 2019-03-14 | Discharge: 2019-03-14 | Disposition: A | Discharge status: Home / Self Care | Payer: Medicare Other | Source: Ambulatory Visit | Attending: Vascular Surgery | Admitting: Vascular Surgery

## 2019-03-15 ENCOUNTER — Other Ambulatory Visit: Payer: Self-pay

## 2019-03-15 ENCOUNTER — Other Ambulatory Visit (HOSPITAL_COMMUNITY)
Admission: RE | Admit: 2019-03-15 | Discharge: 2019-03-15 | Disposition: A | Discharge status: Home / Self Care | Payer: Medicare Other | Source: Ambulatory Visit | Attending: Vascular Surgery | Admitting: Vascular Surgery

## 2019-03-15 DIAGNOSIS — Z20828 Contact with and (suspected) exposure to other viral communicable diseases: Secondary | ICD-10-CM | POA: Diagnosis not present

## 2019-03-15 DIAGNOSIS — Z01812 Encounter for preprocedural laboratory examination: Secondary | ICD-10-CM | POA: Insufficient documentation

## 2019-03-15 LAB — SARS CORONAVIRUS 2 (TAT 6-24 HRS): SARS Coronavirus 2: NEGATIVE

## 2019-03-15 NOTE — Progress Notes (Signed)
Pt provided with Pre-op instructions only. Please complete pt assessment on DOS. Pt denies SOB and chest pain. Pt stated that an EKG was performed at [facility]; records requested. Pt made aware to stop taking  vitamins, fish oil and herbal medications. Do not take any NSAIDs ie: Ibuprofen, Advil, Naproxen (Aleve), Motrin, BC and Goody Powder. Pt made aware to take 38 units of Novolog 70/30 insulin at dinner time on Sunday. Pt made aware to check CBG every 2 hours prior to arrival to hospital on DOS. Pt made aware to treat a CBG < 70 with 4 ounces of apple juice, wait 15 minutes after intervention to recheck BG, if BG remains < 70, call Short Stay unit to speak with a nurse. Pt verbalized understanding of all pre-op instructions. PA, Anesthesiology, asked to review pt history.

## 2019-03-15 NOTE — Anesthesia Preprocedure Evaluation (Addendum)
Anesthesia Evaluation  Patient identified by MRN, date of birth, ID band Patient awake    Reviewed: Allergy & Precautions, H&P , NPO status , Patient's Chart, lab work & pertinent test results  Airway Mallampati: II   Neck ROM: full    Dental   Pulmonary shortness of breath, former smoker,    breath sounds clear to auscultation       Cardiovascular hypertension, + CAD and +CHF  + dysrhythmias  Rhythm:regular Rate:Normal  Pulmonary HTN   Neuro/Psych  Headaches,    GI/Hepatic   Endo/Other  diabetes  Renal/GU ESRF and DialysisRenal disease     Musculoskeletal  (+) Arthritis ,   Abdominal   Peds  Hematology   Anesthesia Other Findings   Reproductive/Obstetrics                            Anesthesia Physical Anesthesia Plan  ASA: IV  Anesthesia Plan: MAC   Post-op Pain Management:    Induction: Intravenous  PONV Risk Score and Plan: 2 and Ondansetron, Propofol infusion and Treatment may vary due to age or medical condition  Airway Management Planned: Simple Face Mask  Additional Equipment:   Intra-op Plan:   Post-operative Plan:   Informed Consent: I have reviewed the patients History and Physical, chart, labs and discussed the procedure including the risks, benefits and alternatives for the proposed anesthesia with the patient or authorized representative who has indicated his/her understanding and acceptance.       Plan Discussed with: CRNA, Anesthesiologist and Surgeon  Anesthesia Plan Comments: (PAT note written 03/15/2019 by Myra Gianotti, PA-C. SAME DAY WORK-UP   )       Anesthesia Quick Evaluation

## 2019-03-15 NOTE — Progress Notes (Signed)
Anesthesia Chart Review:  Case: [id number] Date/Time: 03/18/19 1101   Procedure: LIGATION OF ARTERIOVENOUS  FISTULA (Left )   Anesthesia type: Choice   Pre-op diagnosis: MALFUNCTIONING ARTERIOVENOUS GRAFT   Location: [facility] OR ROOM 16 / [place] OR   Surgeon: Angelia Mould, MD      DISCUSSION: Patient is a 73 year old female scheduled for the above procedure. 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 S/p right forearm AVGG 06/13/18.   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 She had severe pulmonary hypertension by 07/2017 echo.  - By notes, fall on 11/01/18 with right proximal comminuted humeral shaft fracture. Treated conservatively by orthopedist Case, Remo Lipps, MD. Healed by 03/06/19 xray, but referred patient to vascular surgery for LUE AVF pseudoaneurysm.    - Admission 08/13/17-08/14/17 for SOB and near syncope. 08/05/17 echo showed EF 40-45%, grade II diastolic dysfunction, moderate RV dysfunction, PASP 73, moderate MR, mild-moderate TR (PA pressures increased since 2015 echo, but otherwise most other findings also noted on 2014-2015 echocardiograms). 08/14/08 CT was negative for PE. Mildly elevated Troponins, flat trends thought to be due to ESRD.  BNP 1371. Underwent hemodialysis. Cardiology recommended out-patient follow-up. Readmission 08/04/17-08/05/17 for chest pain. Cardiology consulted Percival Spanish, Jeneen Rinks, MD) and felt symptoms were atypical for cardiac etiology. Troponin 0.06-0.02. BNP 1506. D-dimer 2.84. She did have a brief episode of SVT on telemetry. March echo reviewed. Elevated d-dimer felt  likely nonspecific in light of her comorbidities. Per 08/05/17 note, cardiologist Rozann Lesches, MD did not anticipate further ischemic testing at that time, but recommended continued out-patient follow-up with primary cardiologist regarding RV dysfunction and worsening pulmonary hypertension. (Last seen by primary cardiologist Mercie Eon, MD on 08/18/17 with continued medical therapy recommended.)  Patient needs AVGG ligated due to enlarging pseudoaneurysm. She does have a functioning AVGG on the other arm. She undergoes hemodialysis TTS. Overall seemed to be doing well at her PCP follow-up in June except for chronic pain issues. Right humerus healed per recent orthopedic notes. She is a same day work-up, so will need EKG (known LBBB), labs, and anesthesia team evaluation on arrival.    PROVIDERS: - Neville Route, MD [facility]  Established care on 01/17/18. (Previously was seen Nolene Ebbs, MD.) Last primary care evaluation on 11/19/18 for fall follow-up and seen for chronic medical condition follow-up 10/25/18. No chest pain, presyncope, palpitations, cough, or dyspnea at that time.  Fran Lowes, MD is nephrologist ([place]).  Wanita Chamberlain, MD is hematologist. Last visit 07/24/18 (see [place]).  By notes, elevated eosinophil levels felt due to dog dander, dust mites, and cockroaches, and "It does not appear that you have a bone marrow problem."  - Mercie Eon, MD is cardiologist. Last visit 08/18/17 (see [place]). Dr. Norman Clay recommended continued medical therapy with one year follow-up. (Previously she was seen by Mertie Moores, MD in 2014 and Eileen Stanford, MD in 2015-2017.)    LABS: For day of surgery. A1c 7.6% 10/25/18 [facility]).   IMAGES: According to Dr. Aliene Beams 03/06/19 note in [place]: - X-ray right humerus 03/06/19: "Healed  proximal humeral shaft fracture apex anterior angulation.  Humeral head is articulating with the glenoid without dislocation. Less than 45 degrees of angulation present at the fracture site"  - X-rays left elbow 03/06/19: "There is a soft tissue swelling anterior antecubital space in the area of multiple vascular clips. No underlying bony abnormality or erosion"  1V PCXR 06/13/18: IMPRESSION: Stable cardiomegaly with central pulmonary vascular congestion. Interval placement of right internal jugular dialysis catheter.   EKG: For updated EKG on arrival.   Last EKG seen was from 08/13/17: SR, left BBB. Left BBB is old (at least since 08/11/14).    CV: Echo 08/05/17: Study Conclusions - Left ventricle: The cavity size was at the upper limits of normal. Wall thickness was increased in a pattern of mild LVH. Systolic function was mildly to moderately reduced. The estimated ejection fraction was in the range of 40% to 45%. There is akinesis of the basalinferolateral, inferior, and inferoseptal myocardium. Features are consistent with a pseudonormal left ventricular filling pattern, with concomitant abnormal relaxation and increased filling pressure (grade 2 diastolic dysfunction). - Aortic valve: Mildly to moderately calcified annulus. Trileaflet; mildly calcified leaflets. - Mitral valve: Mildly to moderately calcified annulus. Moderately thickened, moderately calcified leaflets . There was moderate regurgitation. - Left atrium: The atrium was mildly dilated. - Right ventricle: The cavity size was mildly dilated. Systolic function was moderately reduced. - Right atrium: The atrium was mildly dilated. Central venous pressure (est): 15 mm Hg. - Tricuspid valve: There was mild-moderate regurgitation. - Pulmonary arteries: Systolic pressure was severely increased. PA peak pressure: 73 mm Hg (S). - Pericardium, extracardiac: There was no pericardial effusion. (Comparison echo 03/18/14: LVEF 40-45%, mild-moderate global  LV hypokinesis, moderate MR, severe TR, RVSP 45.5 mmHg; 10/21/12: LVEF 30-35%, moderate MR, moderate-severe TR, RV function moderately reduced; 08/21/12: EF 40-45%, diffuse hypokinesis, moderate inferior hypokinesis, PA peak pressure 34 mmHg).  Cardiac cath 12/19/03: ANGIOGRAPHY: 1. The left main is smooth and normal. 2. The left anterior descending artery has mild to moderate stenosis in the proximal left anterior descending. This stenosis is approximately 30%. There are minor luminal irregularities throughout the remainder of the left anterior descending. 3. The first diagonal vessel is a moderate size vessel with 30-40% stenosis. 4. The left circumflex artery is a moderate to large vessel. There is a large first obtuse marginal artery that branches. The inferior most branch has a 90% stenosis. This branch is approximately 1.5-2 mm in diameter and is not an ideal candidate for stenting because of its small size and the diffuse nature of the blockage. We certainly would not be able to fit any of the new drug eluding stents and a small metal stent would likely occlude. 5. The right coronary artery is proximally occluded just prior to the stent. There is a long area of occlusion in the distal and mid right coronary artery filled via left to right collaterals. LEFT VENTRICULOGRAM: The ejection fraction is approximately 50%. Inferior  wall hypokinesis. CONCLUSIONS: 1. The patient has a history of an inferior lateral myocardial infarction. The right coronary artery stent is occluded, and it appears to be chronic. I do not think that we will be successful in opening this up total occlusion. In addition, she is completely asymptomatic and the Cardiolite scan reveals a completed scar in this region. 2. She does have evidence of anterior lateral ischemia on the Cardiolite scan, and this is most likely due to this small branch of  the obtuse marginal artery. This vessel is  fairly small and although we could attempt angioplasty I think that the risk of reclosure is quite high. It certainly would not decrease angioplasty of this vessel but certainly would not decrease the risks of perioperative myocardial infarction with her upcoming arm surgery. I think that we need to continue with medical therapy for this lesion. She is at some increased risk for perioperative myocardial infarction, but the area supplied by this vessel is quite small and I think that she could be managed medically through this.    Past Medical History:  Diagnosis Date  . Arthritis   . Cancer [facility]) 2005    left breast  . CHF (congestive heart failure) ([place])   . Chronic back pain   . Chronic kidney disease 03/2014   dialysis t/th/sa  . Coronary artery disease   . Diabetes mellitus    Type 2  . Diabetic nephropathy ([place]) 01-08-13  . Dyslipidemia 01-08-13  . ESRD on hemodialysis ([place])    Tu, Th, Sat  . Headache   . Hyperlipidemia   . Hypertension   . LBBB (left bundle branch block)   . Proteinuria 01-08-13  . Pulmonary hypertension ([place])    PA peak pressure 73 mmHg 08/05/17 echo  . Thyroid disease 01-08-13   Hyper-parathyroidism-secondary  . Wears glasses     Past Surgical History:  Procedure Laterality Date  . A/V FISTULAGRAM N/A 08/25/2017   Procedure: A/V FISTULAGRAM - Left Arm;  Surgeon: Angelia Mould, MD;  Location: [place] CV LAB;  Service: Cardiovascular;  Laterality: N/A;  . A/V FISTULAGRAM N/A 02/09/2018   Procedure: A/V [id number];  Surgeon: Elam Dutch, MD;  Location: [place] CV LAB;  Service: Cardiovascular;  Laterality: N/A;  . A/V FISTULAGRAM Left 04/04/2018   Procedure: A/V FISTULAGRAM;  Surgeon: Marty Heck, MD;  Location: [place] CV LAB;  Service: Cardiovascular;  Laterality: Left;  . ABDOMINAL AORTAGRAM N/A 08/11/2014   Procedure: ABDOMINAL  Maxcine Ham;  Surgeon: Angelia Mould, MD;  Location: [facility] CATH LAB;  Service: Cardiovascular;  Laterality: N/A;  . ABDOMINAL HYSTERECTOMY    . APPENDECTOMY    . Arm surgery     Left arm trauma  . AV FISTULA PLACEMENT Left 08/21/2014   Procedure: LEFT ARM ARTERIOVENOUS (AV) FISTULA CREATION ;  Surgeon: Angelia Mould, MD;  [facility];  Service: Vascular;  Laterality: Left;  . AV FISTULA PLACEMENT Right 06/13/2018   Procedure: insertion of right arm ARTERIOVENOUS (AV) gore-tex GRAFT;  Surgeon: Rosetta Posner, MD;  Location: [place];  Service: Vascular;  Laterality: Right;  . BACK SURGERY    . CATARACT EXTRACTION W/PHACO Left 12/22/2014   Procedure: CATARACT EXTRACTION PHACO AND INTRAOCULAR LENS PLACEMENT (IOC);  Surgeon: Tonny Branch, MD;  Location: [facility];  Service: Ophthalmology;  Laterality: Left;  CDE 13.48  . CORONARY ANGIOPLASTY  12/19/2003   inferior wall hypokinesis. ef 50%  . dialysis catheter    . INSERTION OF DIALYSIS CATHETER Right 06/13/2018   Procedure: INSERTION OF DIALYSIS CATHETER, right internal jugular;  Surgeon: Rosetta Posner, MD;  Location: [place];  Service: Vascular;  Laterality: Right;  . IR FLUORO GUIDE CV LINE RIGHT  04/06/2018  . IR REMOVAL TUN CV CATH W/O FL  04/25/2018  . IR REMOVAL TUN CV CATH W/O FL  07/20/2018  . IR US GUIDE VASC ACCESS RIGHT  04/06/2018  . MASTECTOMY     Left  . PERIPHERAL VASCULAR BALLOON ANGIOPLASTY Left 08/25/2017   Procedure: PERIPHERAL VASCULAR BALLOON  ANGIOPLASTY;  Surgeon: Angelia Mould, MD;  Location: [place] CV LAB;  Service: Cardiovascular;  Laterality: Left;  arm fistula  . PERIPHERAL VASCULAR BALLOON ANGIOPLASTY Left 02/09/2018   Procedure: PERIPHERAL VASCULAR BALLOON ANGIOPLASTY;  Surgeon: Elam Dutch, MD;  Location: [place] CV LAB;  Service: Cardiovascular;  Laterality: Left;  AV Fistula  . PERIPHERAL VASCULAR BALLOON ANGIOPLASTY Left 04/04/2018   Procedure: PERIPHERAL VASCULAR BALLOON ANGIOPLASTY;  Surgeon:  Marty Heck, MD;  Location: [place] CV LAB;  Service: Cardiovascular;  Laterality: Left;  UPPER ARM FISTULA  . PERIPHERAL VASCULAR CATHETERIZATION N/A 09/16/2015   Procedure: Fistulagram;  Surgeon: Rosetta Posner, MD;  Location: [place] CV LAB;  Service: Cardiovascular;  Laterality: N/A;  . SPINE SURGERY    . TONSILLECTOMY    . TRANSTHORACIC ECHOCARDIOGRAM  10/01/2010    Left ventricle: The cavity size was mildly dilated. Wall thickness was increased in a pattern of mild LVH. Systolic function was   mildly reduced. The estimated ejection fraction was in the range  of 45% to 50%.   . TUBAL LIGATION      MEDICATIONS: . lidocaine (PF) (XYLOCAINE) 1 % injection 0.3 mL   . acetaminophen (TYLENOL) 325 MG tablet  . albuterol (VENTOLIN HFA) 108 (90 Base) MCG/ACT inhaler  . amitriptyline (ELAVIL) 25 MG tablet  . aspirin EC 81 MG EC tablet  . atorvastatin (LIPITOR) 80 MG tablet  . AURYXIA 1 GM 210 MG(Fe) tablet  . Calcium-Vitamin D-Vitamin K 500-500-40 MG-UNT-MCG CHEW  . carvedilol (COREG) 6.25 MG tablet  . diclofenac sodium (VOLTAREN) 1 % GEL  . docusate sodium (COLACE) 100 MG capsule  . gabapentin (NEURONTIN) 100 MG capsule  . hydrALAZINE (APRESOLINE) 100 MG tablet  . HYDROcodone-acetaminophen (NORCO/VICODIN) 5-325 MG tablet  . isosorbide mononitrate (IMDUR) 30 MG 24 hr tablet  . lidocaine-prilocaine (EMLA) cream  . nitroGLYCERIN (NITROSTAT) 0.4 MG SL tablet  . NOVOLOG MIX 70/30 FLEXPEN (70-30) 100 UNIT/ML FlexPen  . polyethylene glycol (MIRALAX / GLYCOLAX) packet  . sevelamer carbonate (RENVELA) 800 MG tablet  . tiZANidine (ZANAFLEX) 4 MG tablet  . triamcinolone cream (KENALOG) 0.5 %  . calcitRIOL (ROCALTROL) 0.25 MCG capsule    Myra Gianotti, PA-C Surgical Short Stay/Anesthesiology [facility] Phone [phone] [facility] Phone [phone] 03/15/2019 2:17 PM

## 2019-03-16 ENCOUNTER — Other Ambulatory Visit (HOSPITAL_COMMUNITY): Payer: Medicare Other

## 2019-03-18 ENCOUNTER — Encounter (HOSPITAL_COMMUNITY)
Admission: RE | Disposition: A | Discharge status: Home / Self Care | Payer: Self-pay | Source: Home / Self Care | Attending: Vascular Surgery

## 2019-03-18 ENCOUNTER — Ambulatory Visit (HOSPITAL_COMMUNITY)
Admission: RE | Admit: 2019-03-18 | Discharge: 2019-03-18 | Disposition: A | Discharge status: Home / Self Care | Payer: Medicare Other | Attending: Vascular Surgery | Admitting: Vascular Surgery

## 2019-03-18 ENCOUNTER — Encounter (HOSPITAL_COMMUNITY): Payer: Self-pay | Admitting: Certified Registered Nurse Anesthetist

## 2019-03-18 ENCOUNTER — Ambulatory Visit (HOSPITAL_COMMUNITY): Payer: Medicare Other | Admitting: Physician Assistant

## 2019-03-18 ENCOUNTER — Other Ambulatory Visit: Payer: Self-pay

## 2019-03-18 DIAGNOSIS — E669 Obesity, unspecified: Secondary | ICD-10-CM | POA: Diagnosis not present

## 2019-03-18 DIAGNOSIS — I2510 Atherosclerotic heart disease of native coronary artery without angina pectoris: Secondary | ICD-10-CM | POA: Insufficient documentation

## 2019-03-18 DIAGNOSIS — Y832 Surgical operation with anastomosis, bypass or graft as the cause of abnormal reaction of the patient, or of later complication, without mention of misadventure at the time of the procedure: Secondary | ICD-10-CM | POA: Diagnosis not present

## 2019-03-18 DIAGNOSIS — Z7982 Long term (current) use of aspirin: Secondary | ICD-10-CM | POA: Diagnosis not present

## 2019-03-18 DIAGNOSIS — Z9012 Acquired absence of left breast and nipple: Secondary | ICD-10-CM | POA: Diagnosis not present

## 2019-03-18 DIAGNOSIS — E079 Disorder of thyroid, unspecified: Secondary | ICD-10-CM | POA: Diagnosis not present

## 2019-03-18 DIAGNOSIS — T829XXA Unspecified complication of cardiac and vascular prosthetic device, implant and graft, initial encounter: Secondary | ICD-10-CM | POA: Diagnosis present

## 2019-03-18 DIAGNOSIS — Z87891 Personal history of nicotine dependence: Secondary | ICD-10-CM | POA: Diagnosis not present

## 2019-03-18 DIAGNOSIS — Z794 Long term (current) use of insulin: Secondary | ICD-10-CM | POA: Insufficient documentation

## 2019-03-18 DIAGNOSIS — E785 Hyperlipidemia, unspecified: Secondary | ICD-10-CM | POA: Diagnosis not present

## 2019-03-18 DIAGNOSIS — M1990 Unspecified osteoarthritis, unspecified site: Secondary | ICD-10-CM | POA: Diagnosis not present

## 2019-03-18 DIAGNOSIS — I509 Heart failure, unspecified: Secondary | ICD-10-CM | POA: Insufficient documentation

## 2019-03-18 DIAGNOSIS — Z79899 Other long term (current) drug therapy: Secondary | ICD-10-CM | POA: Diagnosis not present

## 2019-03-18 DIAGNOSIS — I272 Pulmonary hypertension, unspecified: Secondary | ICD-10-CM | POA: Diagnosis not present

## 2019-03-18 DIAGNOSIS — Z888 Allergy status to other drugs, medicaments and biological substances status: Secondary | ICD-10-CM | POA: Insufficient documentation

## 2019-03-18 DIAGNOSIS — Z6836 Body mass index (BMI) 36.0-36.9, adult: Secondary | ICD-10-CM | POA: Diagnosis not present

## 2019-03-18 DIAGNOSIS — I132 Hypertensive heart and chronic kidney disease with heart failure and with stage 5 chronic kidney disease, or end stage renal disease: Secondary | ICD-10-CM | POA: Insufficient documentation

## 2019-03-18 DIAGNOSIS — Z992 Dependence on renal dialysis: Secondary | ICD-10-CM | POA: Diagnosis not present

## 2019-03-18 DIAGNOSIS — E1122 Type 2 diabetes mellitus with diabetic chronic kidney disease: Secondary | ICD-10-CM | POA: Diagnosis not present

## 2019-03-18 DIAGNOSIS — Z8249 Family history of ischemic heart disease and other diseases of the circulatory system: Secondary | ICD-10-CM | POA: Diagnosis not present

## 2019-03-18 DIAGNOSIS — N186 End stage renal disease: Secondary | ICD-10-CM | POA: Insufficient documentation

## 2019-03-18 DIAGNOSIS — T82898A Other specified complication of vascular prosthetic devices, implants and grafts, initial encounter: Secondary | ICD-10-CM

## 2019-03-18 DIAGNOSIS — Z95828 Presence of other vascular implants and grafts: Secondary | ICD-10-CM | POA: Diagnosis not present

## 2019-03-18 HISTORY — PX: LIGATION OF ARTERIOVENOUS  FISTULA: SHX5948

## 2019-03-18 LAB — POCT I-STAT, CHEM 8
BUN: 32 mg/dL — ABNORMAL HIGH (ref 8–23)
Calcium, Ion: 0.84 mmol/L — CL (ref 1.15–1.40)
Chloride: 101 mmol/L (ref 98–111)
Creatinine, Ser: 8.6 mg/dL — ABNORMAL HIGH (ref 0.44–1.00)
Glucose, Bld: 126 mg/dL — ABNORMAL HIGH (ref 70–99)
HCT: 37 % (ref 36.0–46.0)
Hemoglobin: 12.6 g/dL (ref 12.0–15.0)
Potassium: 3.9 mmol/L (ref 3.5–5.1)
Sodium: 132 mmol/L — ABNORMAL LOW (ref 135–145)
TCO2: 26 mmol/L (ref 22–32)

## 2019-03-18 LAB — GLUCOSE, CAPILLARY
Glucose-Capillary: 101 mg/dL — ABNORMAL HIGH (ref 70–99)
Glucose-Capillary: 111 mg/dL — ABNORMAL HIGH (ref 70–99)

## 2019-03-18 SURGERY — LIGATION OF ARTERIOVENOUS  FISTULA
Anesthesia: Monitor Anesthesia Care | Site: Arm Upper | Laterality: Left

## 2019-03-18 MED ORDER — PROPOFOL 500 MG/50ML IV EMUL
INTRAVENOUS | Status: DC | PRN
  Administered 2019-03-18: 100 ug/kg/min via INTRAVENOUS

## 2019-03-18 MED ORDER — OXYCODONE-ACETAMINOPHEN 5-325 MG PO TABS: 1.0000 | ORAL_TABLET | ORAL | 0 refills | Status: DC | PRN

## 2019-03-18 MED ORDER — PHENYLEPHRINE HCL (PRESSORS) 10 MG/ML IV SOLN
INTRAVENOUS | Status: DC | PRN
  Administered 2019-03-18: 200 ug via INTRAVENOUS
  Administered 2019-03-18: 80 ug via INTRAVENOUS

## 2019-03-18 MED ORDER — OXYCODONE HCL 5 MG/5ML PO SOLN: 5.0000 mg | Freq: Once | ORAL | Status: DC | PRN

## 2019-03-18 MED ORDER — PHENYLEPHRINE 40 MCG/ML (10ML) SYRINGE FOR IV PUSH (FOR BLOOD PRESSURE SUPPORT)
PREFILLED_SYRINGE | INTRAVENOUS | Status: AC
  Filled 2019-03-18: qty 10

## 2019-03-18 MED ORDER — ONDANSETRON HCL 4 MG/2ML IJ SOLN: 4.0000 mg | Freq: Four times a day (QID) | INTRAMUSCULAR | Status: DC | PRN

## 2019-03-18 MED ORDER — EPHEDRINE SULFATE 50 MG/ML IJ SOLN
INTRAMUSCULAR | Status: DC | PRN
  Administered 2019-03-18: 5 mg via INTRAVENOUS

## 2019-03-18 MED ORDER — FENTANYL CITRATE (PF) 250 MCG/5ML IJ SOLN
INTRAMUSCULAR | Status: AC
  Filled 2019-03-18: qty 5

## 2019-03-18 MED ORDER — CEFAZOLIN SODIUM-DEXTROSE 2-4 GM/100ML-% IV SOLN
INTRAVENOUS | Status: AC
  Filled 2019-03-18: qty 100

## 2019-03-18 MED ORDER — CEFAZOLIN SODIUM-DEXTROSE 2-4 GM/100ML-% IV SOLN
2.0000 g | INTRAVENOUS | Status: AC
  Administered 2019-03-18: 2 g via INTRAVENOUS

## 2019-03-18 MED ORDER — VASOPRESSIN 20 UNIT/ML IV SOLN
INTRAVENOUS | Status: DC | PRN
  Administered 2019-03-18: 1 [IU] via INTRAVENOUS

## 2019-03-18 MED ORDER — SODIUM CHLORIDE 0.9 % IV SOLN
INTRAVENOUS | Status: DC
  Administered 2019-03-18: 09:00:00 via INTRAVENOUS

## 2019-03-18 MED ORDER — FENTANYL CITRATE (PF) 100 MCG/2ML IJ SOLN: 25.0000 ug | INTRAMUSCULAR | Status: DC | PRN

## 2019-03-18 MED ORDER — PHENYLEPHRINE HCL-NACL 10-0.9 MG/250ML-% IV SOLN
INTRAVENOUS | Status: DC | PRN
  Administered 2019-03-18: 50 ug/min via INTRAVENOUS

## 2019-03-18 MED ORDER — FENTANYL CITRATE (PF) 100 MCG/2ML IJ SOLN
INTRAMUSCULAR | Status: DC | PRN
  Administered 2019-03-18: 50 ug via INTRAVENOUS

## 2019-03-18 MED ORDER — LIDOCAINE-EPINEPHRINE (PF) 1 %-1:200000 IJ SOLN
INTRAMUSCULAR | Status: DC | PRN
  Administered 2019-03-18: 14 mL

## 2019-03-18 MED ORDER — ONDANSETRON HCL 4 MG/2ML IJ SOLN
INTRAMUSCULAR | Status: AC
  Filled 2019-03-18: qty 2

## 2019-03-18 MED ORDER — OXYCODONE HCL 5 MG PO TABS: 5.0000 mg | ORAL_TABLET | Freq: Once | ORAL | Status: DC | PRN

## 2019-03-18 MED ORDER — LIDOCAINE HCL (PF) 1 % IJ SOLN
INTRAMUSCULAR | Status: AC
  Filled 2019-03-18: qty 30

## 2019-03-18 MED ORDER — ONDANSETRON HCL 4 MG/2ML IJ SOLN
INTRAMUSCULAR | Status: DC | PRN
  Administered 2019-03-18: 4 mg via INTRAVENOUS

## 2019-03-18 MED ORDER — 0.9 % SODIUM CHLORIDE (POUR BTL) OPTIME
TOPICAL | Status: DC | PRN
  Administered 2019-03-18: 1000 mL

## 2019-03-18 SURGICAL SUPPLY — 34 items
CANISTER SUCT 3000ML PPV (MISCELLANEOUS) ×3 IMPLANT
CANNULA VESSEL 3MM 2 BLNT TIP (CANNULA) IMPLANT
CLIP VESOCCLUDE MED 6/CT (CLIP) ×3 IMPLANT
CLIP VESOCCLUDE SM WIDE 6/CT (CLIP) ×3 IMPLANT
COVER WAND RF STERILE (DRAPES) ×3 IMPLANT
DERMABOND ADVANCED (GAUZE/BANDAGES/DRESSINGS) ×2
DERMABOND ADVANCED .7 DNX12 (GAUZE/BANDAGES/DRESSINGS) ×1 IMPLANT
ELECT REM PT RETURN 9FT ADLT (ELECTROSURGICAL) ×3
ELECTRODE REM PT RTRN 9FT ADLT (ELECTROSURGICAL) ×1 IMPLANT
GLOVE BIO SURGEON STRL SZ 6.5 (GLOVE) ×2 IMPLANT
GLOVE BIO SURGEON STRL SZ7.5 (GLOVE) ×3 IMPLANT
GLOVE BIO SURGEONS STRL SZ 6.5 (GLOVE) ×1
GLOVE BIOGEL PI IND STRL 6.5 (GLOVE) ×2 IMPLANT
GLOVE BIOGEL PI IND STRL 8 (GLOVE) ×1 IMPLANT
GLOVE BIOGEL PI INDICATOR 6.5 (GLOVE) ×4
GLOVE BIOGEL PI INDICATOR 8 (GLOVE) ×2
GOWN STRL REUS W/ TWL LRG LVL3 (GOWN DISPOSABLE) ×3 IMPLANT
GOWN STRL REUS W/TWL LRG LVL3 (GOWN DISPOSABLE) ×6
KIT BASIN OR (CUSTOM PROCEDURE TRAY) ×3 IMPLANT
KIT TURNOVER KIT B (KITS) ×3 IMPLANT
NEEDLE HYPO 25GX1X1/2 BEV (NEEDLE) IMPLANT
NS IRRIG 1000ML POUR BTL (IV SOLUTION) ×3 IMPLANT
PACK CV ACCESS (CUSTOM PROCEDURE TRAY) ×3 IMPLANT
PAD ARMBOARD 7.5X6 YLW CONV (MISCELLANEOUS) ×6 IMPLANT
SPONGE SURGIFOAM ABS GEL 100 (HEMOSTASIS) IMPLANT
SUT ETHILON 3 0 PS 1 (SUTURE) IMPLANT
SUT PROLENE 6 0 BV (SUTURE) IMPLANT
SUT SILK 0 TIES 10X30 (SUTURE) ×3 IMPLANT
SUT VIC AB 3-0 SH 27 (SUTURE) ×2
SUT VIC AB 3-0 SH 27X BRD (SUTURE) ×1 IMPLANT
SUT VICRYL 4-0 PS2 18IN ABS (SUTURE) ×3 IMPLANT
TOWEL GREEN STERILE (TOWEL DISPOSABLE) ×3 IMPLANT
UNDERPAD 30X30 (UNDERPADS AND DIAPERS) ×3 IMPLANT
WATER STERILE IRR 1000ML POUR (IV SOLUTION) ×3 IMPLANT

## 2019-03-18 NOTE — Transfer of Care (Signed)
Immediate Anesthesia Transfer of Care Note  Patient: Alexis [name]  Procedure(s) Performed: LIGATION OF ARTERIOVENOUS  FISTULA  LEFT ARM (Left Arm Upper)  Patient Location: PACU  Anesthesia Type:MAC  Level of Consciousness: awake and alert   Airway & Oxygen Therapy: Patient Spontanous Breathing  Post-op Assessment: Report given to RN and Post -op Vital signs reviewed and stable  Post vital signs: Reviewed and stable  Last Vitals:  Vitals Value Taken Time  BP 140/103 03/18/19 1154  Temp 36.2 C 03/18/19 1148  Pulse 110 03/18/19 1158  Resp 30 03/18/19 1158  SpO2 96 % 03/18/19 1158  Vitals shown include unvalidated device data.  Last Pain:  Vitals:   03/18/19 1148  TempSrc:   PainSc: 0-No pain         Complications: No apparent anesthesia complications

## 2019-03-18 NOTE — Anesthesia Procedure Notes (Signed)
Procedure Name: MAC Date/Time: 03/18/2019 10:30 AM Performed by: Inda Coke, CRNA Pre-anesthesia Checklist: Patient identified, Emergency Drugs available, Suction available, Timeout performed and Patient being monitored Patient Re-evaluated:Patient Re-evaluated prior to induction Oxygen Delivery Method: Simple face mask Induction Type: IV induction Dental Injury: Teeth and Oropharynx as per pre-operative assessment

## 2019-03-18 NOTE — Interval H&P Note (Signed)
History and Physical Interval Note:  03/18/2019 10:14 AM  Alexis [name]  has presented today for surgery, with the diagnosis of MALFUNCTIONING ARTERIOVENOUS GRAFT.  The various methods of treatment have been discussed with the patient and family. After consideration of risks, benefits and other options for treatment, the patient has consented to  Procedure(s): LIGATION OF ARTERIOVENOUS  FISTULA (Left) as a surgical intervention.  The patient's history has been reviewed, patient examined, no change in status, stable for surgery.  I have reviewed the patient's chart and labs.  Questions were answered to the patient's satisfaction.     [name]

## 2019-03-18 NOTE — Op Note (Signed)
    NAME: Alexis [name]    MRN: [id number] DOB: [birth year]    DATE OF OPERATION: 03/18/2019  PREOP DIAGNOSIS:    Painful left upper arm fistula  POSTOP DIAGNOSIS:    Same  PROCEDURE:    Ligation of left brachiocephalic fistula and excision of large aneurysm  SURGEON: Judeth Cornfield. Scot Dock, MD  ASSIST: None  ANESTHESIA: Local with sedation  EBL: Minimal  INDICATIONS:    Alexis [name] is a 73 y.o. female who had a fistula in the left upper arm that was not being used.  It was significantly painful.  She was seen in the office by the nurse practitioner and set up for ligation of her fistula.  FINDINGS:   She had a large aneurysm in her proximal fistula and I excised this.  TECHNIQUE:   The patient was taken to the operating room and was sedated by anesthesia.  The left arm was prepped and draped in usual sterile fashion.  After the skin was anesthetized with 1% lidocaine there was a large aneurysm in the proximal fistula and I dissected this free circumferentially.  I then ligated the fistula proximal to the aneurysm and also distal to the aneurysm.  This large aneurysm was then excised.  Hemostasis was obtained in the wound.  The wound was closed with a deep layer of 3-0 Vicryl and the skin closed with 4-0 Vicryl.  Dermabond was applied.  The patient tolerated procedure well and was transferred to the recovery room in stable condition.  All needle and sponge counts were correct.  [name], MD, FACS [facility]  DATE OF DICTATION:   03/18/2019

## 2019-03-18 NOTE — Progress Notes (Signed)
CRITICAL VALUE ALERT  Critical Value:  Calcium Ion, 0.84--Obtained from [facility]  Date & Time Notied:  03/18/19 8:34 AM  Provider Notified: Dr. Marcie Bal   Orders Received/Actions taken: No new orders

## 2019-03-19 ENCOUNTER — Encounter (HOSPITAL_COMMUNITY): Payer: Self-pay | Admitting: Vascular Surgery

## 2019-03-19 NOTE — Anesthesia Postprocedure Evaluation (Signed)
Anesthesia Post Note  Patient: Alexis [name]  Procedure(s) Performed: LIGATION OF ARTERIOVENOUS  FISTULA  LEFT ARM (Left Arm Upper)     Patient location during evaluation: PACU Anesthesia Type: MAC Level of consciousness: awake and alert Pain management: pain level controlled Vital Signs Assessment: post-procedure vital signs reviewed and stable Respiratory status: spontaneous breathing, nonlabored ventilation, respiratory function stable and patient connected to nasal cannula oxygen Cardiovascular status: stable and blood pressure returned to baseline Postop Assessment: no apparent nausea or vomiting Anesthetic complications: no    Last Vitals:  Vitals:   03/18/19 1205 03/18/19 1218  BP: 116/89 (!) 122/97  Pulse: (!) 105 96  Resp: 19 18  Temp:  36.7 C  SpO2: 96% 96%    Last Pain:  Vitals:   03/18/19 1218  TempSrc:   PainSc: 0-No pain                 Harjit Leider S

## 2019-04-08 IMAGING — CT CT HEAD W/O CM
3 series · 15 of 47 positions shown, 18 images · non-contrast
Comparison: CT head without contrast 09/28/2016

CLINICAL DATA: Right-sided head pain beginning yesterday.

EXAM:
CT HEAD WITHOUT CONTRAST
TECHNIQUE: Contiguous axial images were obtained from the base of the skull
through the vertex without intravenous contrast.

[Series 2: head wo · axial · 0.47mm/px · z∈[-139,-4]mm · 9 of 33 slices shown, 12 images]
[im 3/33  brain]
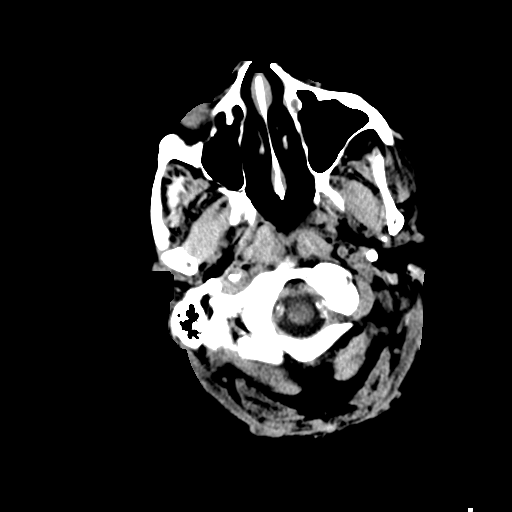
[im 3/33  bone]
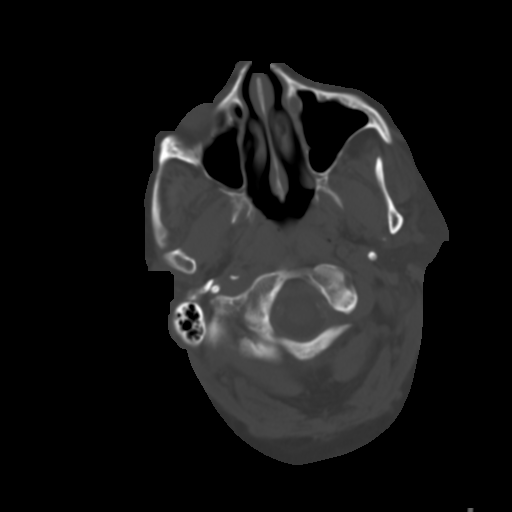
[im 6/33  brain]
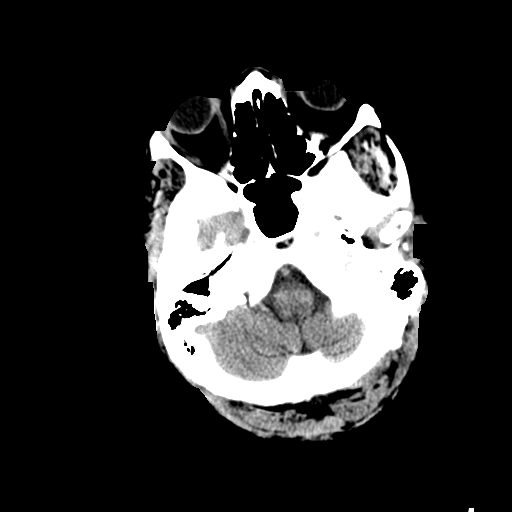
[im 9/33  brain]
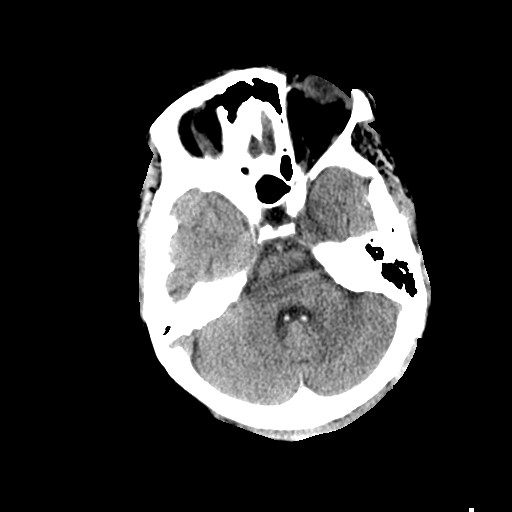
[im 13/33  brain]
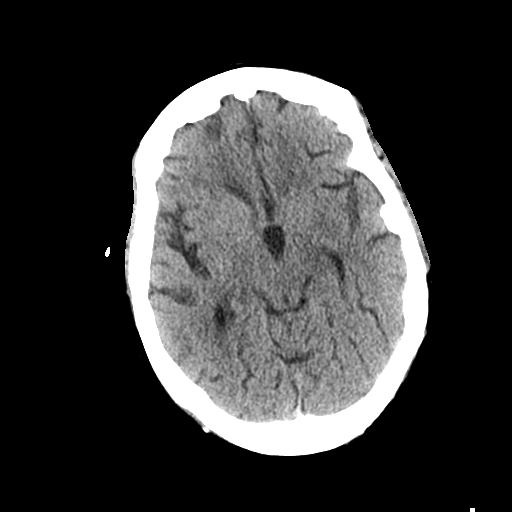
[im 17/33  brain]
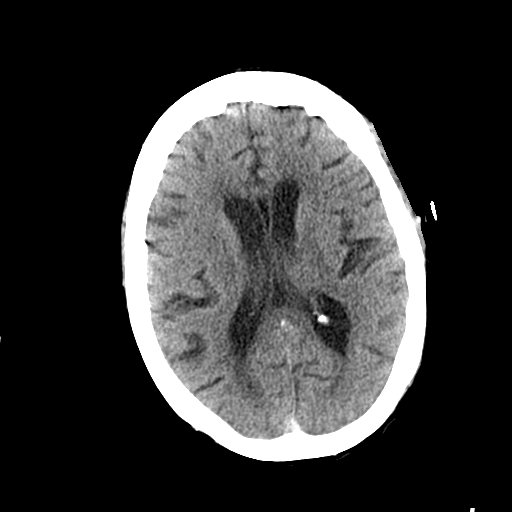
[im 17/33  bone]
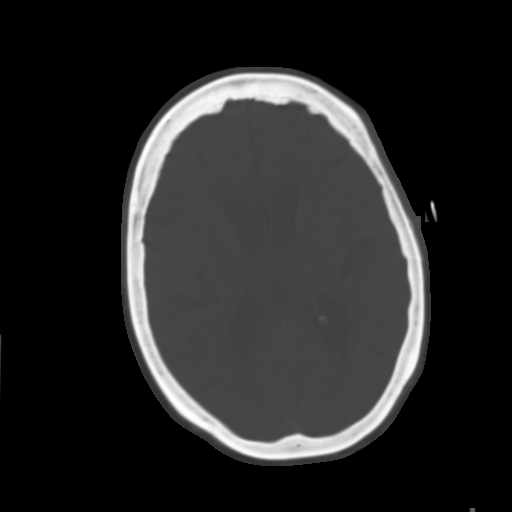
[im 20/33  brain]
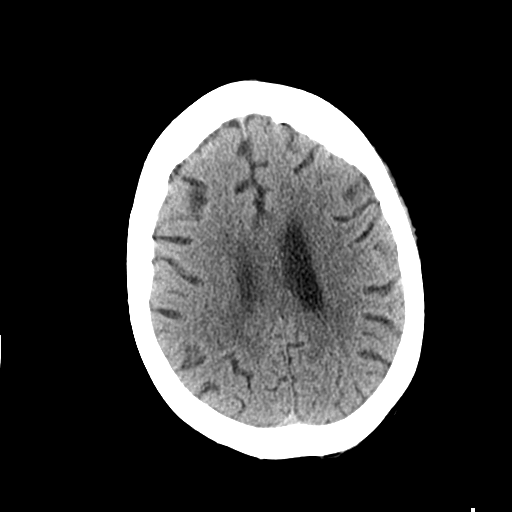
[im 24/33  brain]
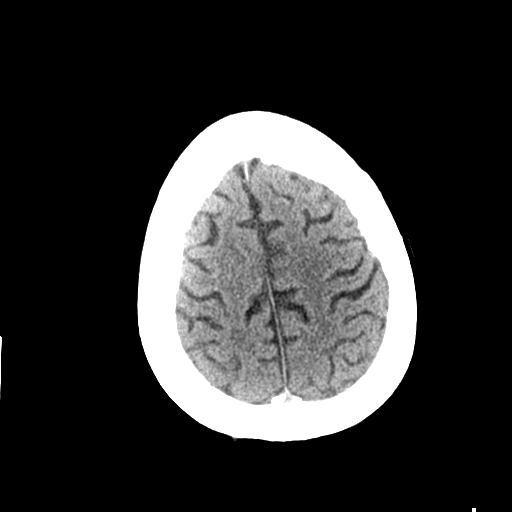
[im 27/33  brain]
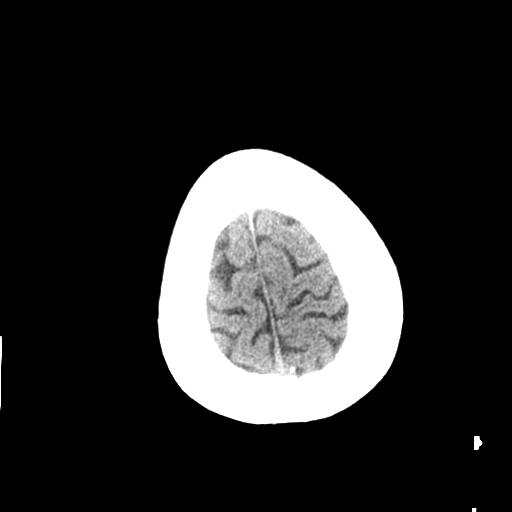
[im 30/33  brain]
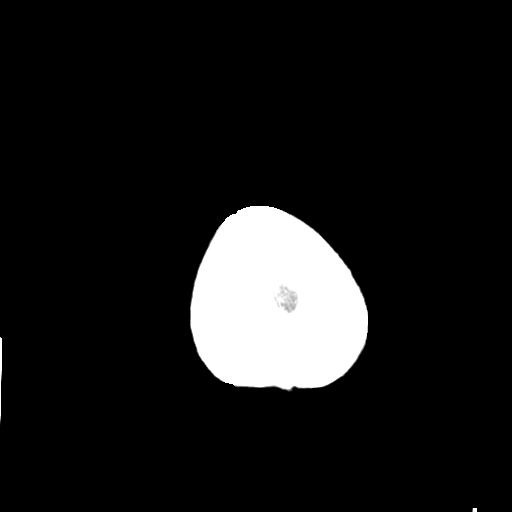
[im 30/33  bone]
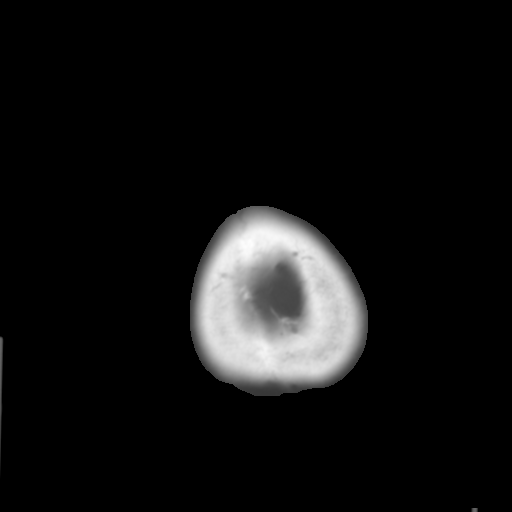

[Series 5: coronal soft tissue · coronal · 0.32mm/px · 3 of 70 slices shown]
[im 24/70  brain]
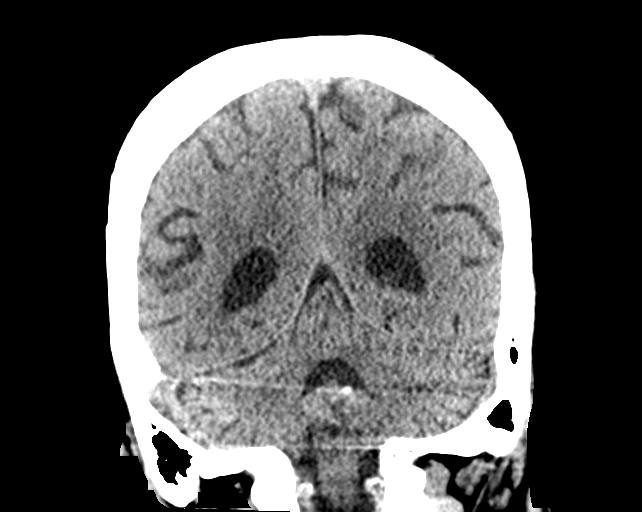
[im 31/70  brain]
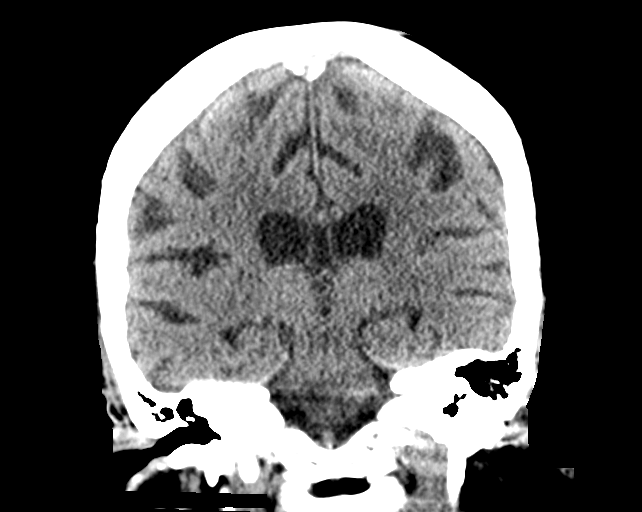
[im 39/70  brain]
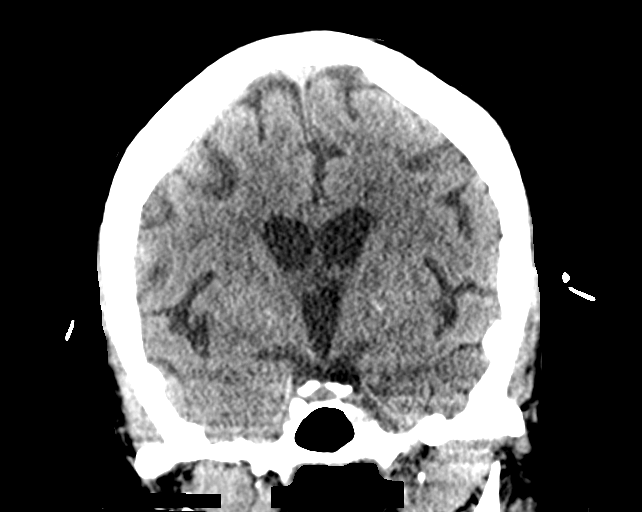

[Series 6: sagittal soft tissue · sagittal · 0.32mm/px · 3 of 68 slices shown]
[im 23/68  brain]
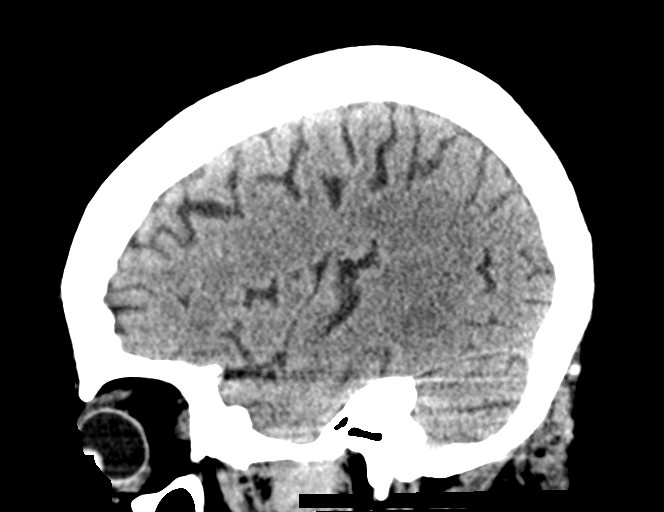
[im 34/68  brain]
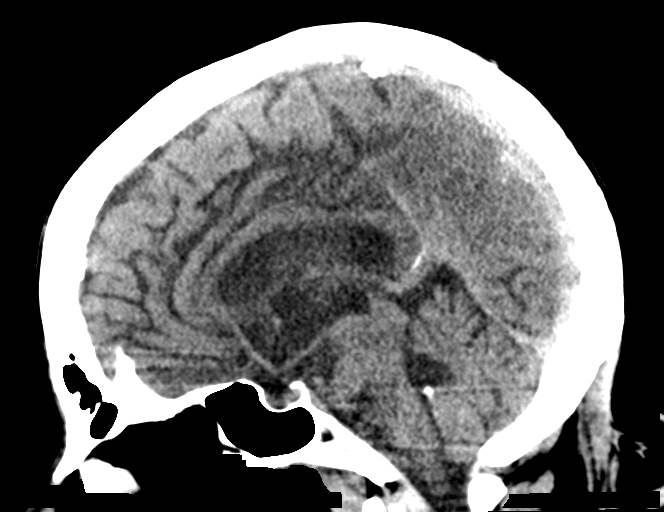
[im 45/68  brain]
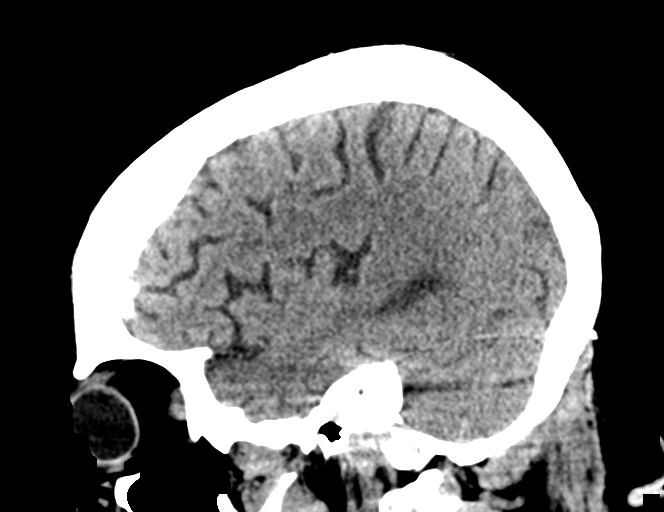

[15 of 47 positions shown; findings below may reference images not displayed]

FINDINGS: Brain: Mild atrophy and white matter changes are stable. No acute
infarct, hemorrhage, or mass lesion is present. The ventricles are
proportionate to the degree of atrophy. No significant extra-axial
fluid collection is present.

Vascular: Dense atherosclerotic calcifications are present within
the cavernous internal carotid arteries bilaterally. There is no
hyperdense vessel. Calcifications are present at the dural margin of
both vertebral arteries.

Skull: Calvarium is intact. No focal lytic or blastic lesions are
present. No significant extracranial soft tissue injury is evident.

Sinuses/Orbits: The paranasal sinuses and mastoid air cells are
clear. A left lens replacement is again noted. Globes and orbits are
otherwise normal.
IMPRESSION: 1. No acute intracranial abnormality.
2. Stable atrophy and white matter disease. This likely reflects the
sequela of chronic microvascular ischemia.

## 2019-05-09 ENCOUNTER — Other Ambulatory Visit: Payer: Self-pay

## 2019-05-09 ENCOUNTER — Encounter (HOSPITAL_COMMUNITY): Payer: Self-pay

## 2019-05-09 ENCOUNTER — Emergency Department (HOSPITAL_COMMUNITY): Payer: Medicare Other

## 2019-05-09 ENCOUNTER — Inpatient Hospital Stay (HOSPITAL_COMMUNITY)
Admission: EM | Admit: 2019-05-09 | Discharge: 2019-05-15 | DRG: 308 | Disposition: A | Discharge status: Home Health | Payer: Medicare Other | Attending: Family Medicine | Admitting: Family Medicine

## 2019-05-09 ENCOUNTER — Inpatient Hospital Stay (HOSPITAL_COMMUNITY): Payer: Medicare Other

## 2019-05-09 DIAGNOSIS — K219 Gastro-esophageal reflux disease without esophagitis: Secondary | ICD-10-CM | POA: Diagnosis present

## 2019-05-09 DIAGNOSIS — I252 Old myocardial infarction: Secondary | ICD-10-CM

## 2019-05-09 DIAGNOSIS — Z79899 Other long term (current) drug therapy: Secondary | ICD-10-CM | POA: Diagnosis not present

## 2019-05-09 DIAGNOSIS — I340 Nonrheumatic mitral (valve) insufficiency: Secondary | ICD-10-CM

## 2019-05-09 DIAGNOSIS — I25118 Atherosclerotic heart disease of native coronary artery with other forms of angina pectoris: Secondary | ICD-10-CM | POA: Diagnosis present

## 2019-05-09 DIAGNOSIS — I5042 Chronic combined systolic (congestive) and diastolic (congestive) heart failure: Secondary | ICD-10-CM | POA: Diagnosis present

## 2019-05-09 DIAGNOSIS — E669 Obesity, unspecified: Secondary | ICD-10-CM | POA: Diagnosis present

## 2019-05-09 DIAGNOSIS — I132 Hypertensive heart and chronic kidney disease with heart failure and with stage 5 chronic kidney disease, or end stage renal disease: Secondary | ICD-10-CM | POA: Diagnosis present

## 2019-05-09 DIAGNOSIS — Z955 Presence of coronary angioplasty implant and graft: Secondary | ICD-10-CM

## 2019-05-09 DIAGNOSIS — R140 Abdominal distension (gaseous): Secondary | ICD-10-CM

## 2019-05-09 DIAGNOSIS — R0789 Other chest pain: Secondary | ICD-10-CM | POA: Diagnosis not present

## 2019-05-09 DIAGNOSIS — E8889 Other specified metabolic disorders: Secondary | ICD-10-CM | POA: Diagnosis present

## 2019-05-09 DIAGNOSIS — I272 Pulmonary hypertension, unspecified: Secondary | ICD-10-CM | POA: Diagnosis present

## 2019-05-09 DIAGNOSIS — I509 Heart failure, unspecified: Secondary | ICD-10-CM

## 2019-05-09 DIAGNOSIS — Z992 Dependence on renal dialysis: Secondary | ICD-10-CM | POA: Diagnosis not present

## 2019-05-09 DIAGNOSIS — R109 Unspecified abdominal pain: Secondary | ICD-10-CM

## 2019-05-09 DIAGNOSIS — I208 Other forms of angina pectoris: Secondary | ICD-10-CM | POA: Diagnosis not present

## 2019-05-09 DIAGNOSIS — I5022 Chronic systolic (congestive) heart failure: Secondary | ICD-10-CM | POA: Diagnosis not present

## 2019-05-09 DIAGNOSIS — Z87891 Personal history of nicotine dependence: Secondary | ICD-10-CM | POA: Diagnosis not present

## 2019-05-09 DIAGNOSIS — E785 Hyperlipidemia, unspecified: Secondary | ICD-10-CM | POA: Diagnosis present

## 2019-05-09 DIAGNOSIS — R079 Chest pain, unspecified: Secondary | ICD-10-CM

## 2019-05-09 DIAGNOSIS — Z20828 Contact with and (suspected) exposure to other viral communicable diseases: Secondary | ICD-10-CM | POA: Diagnosis present

## 2019-05-09 DIAGNOSIS — N2581 Secondary hyperparathyroidism of renal origin: Secondary | ICD-10-CM | POA: Diagnosis present

## 2019-05-09 DIAGNOSIS — E876 Hypokalemia: Secondary | ICD-10-CM

## 2019-05-09 DIAGNOSIS — I959 Hypotension, unspecified: Secondary | ICD-10-CM | POA: Diagnosis present

## 2019-05-09 DIAGNOSIS — R0600 Dyspnea, unspecified: Secondary | ICD-10-CM | POA: Diagnosis present

## 2019-05-09 DIAGNOSIS — I2510 Atherosclerotic heart disease of native coronary artery without angina pectoris: Secondary | ICD-10-CM | POA: Diagnosis not present

## 2019-05-09 DIAGNOSIS — I4891 Unspecified atrial fibrillation: Secondary | ICD-10-CM | POA: Diagnosis present

## 2019-05-09 DIAGNOSIS — Z794 Long term (current) use of insulin: Secondary | ICD-10-CM | POA: Diagnosis not present

## 2019-05-09 DIAGNOSIS — Z90710 Acquired absence of both cervix and uterus: Secondary | ICD-10-CM

## 2019-05-09 DIAGNOSIS — Z7982 Long term (current) use of aspirin: Secondary | ICD-10-CM

## 2019-05-09 DIAGNOSIS — N186 End stage renal disease: Secondary | ICD-10-CM | POA: Diagnosis present

## 2019-05-09 DIAGNOSIS — D649 Anemia, unspecified: Secondary | ICD-10-CM | POA: Diagnosis present

## 2019-05-09 DIAGNOSIS — E1122 Type 2 diabetes mellitus with diabetic chronic kidney disease: Secondary | ICD-10-CM | POA: Diagnosis present

## 2019-05-09 LAB — BASIC METABOLIC PANEL
Anion gap: 13 (ref 5–15)
BUN: 16 mg/dL (ref 8–23)
CO2: 26 mmol/L (ref 22–32)
Calcium: 8.6 mg/dL — ABNORMAL LOW (ref 8.9–10.3)
Chloride: 97 mmol/L — ABNORMAL LOW (ref 98–111)
Creatinine, Ser: 4.35 mg/dL — ABNORMAL HIGH (ref 0.44–1.00)
GFR calc Af Amer: 11 mL/min — ABNORMAL LOW (ref >60)
GFR calc non Af Amer: 9 mL/min — ABNORMAL LOW (ref >60)
Glucose, Bld: 83 mg/dL (ref 70–99)
Potassium: 3 mmol/L — ABNORMAL LOW (ref 3.5–5.1)
Sodium: 136 mmol/L (ref 135–145)

## 2019-05-09 LAB — APTT: aPTT: 31 seconds (ref 24–36)

## 2019-05-09 LAB — CBG MONITORING, ED: Glucose-Capillary: 186 mg/dL — ABNORMAL HIGH (ref 70–99)

## 2019-05-09 LAB — CBC
HCT: 37.2 % (ref 36.0–46.0)
Hemoglobin: 11.8 g/dL — ABNORMAL LOW (ref 12.0–15.0)
MCH: 25.6 pg — ABNORMAL LOW (ref 26.0–34.0)
MCHC: 31.7 g/dL (ref 30.0–36.0)
MCV: 80.7 fL (ref 80.0–100.0)
Platelets: 148 10*3/uL — ABNORMAL LOW (ref 150–400)
RBC: 4.61 MIL/uL (ref 3.87–5.11)
RDW: 15.5 % (ref 11.5–15.5)
WBC: 8.5 10*3/uL (ref 4.0–10.5)
nRBC: 0 % (ref 0.0–0.2)

## 2019-05-09 LAB — TSH: TSH: 1.267 u[IU]/mL (ref 0.350–4.500)

## 2019-05-09 LAB — GLUCOSE, CAPILLARY: Glucose-Capillary: 141 mg/dL — ABNORMAL HIGH (ref 70–99)

## 2019-05-09 LAB — PROTIME-INR
INR: 1 (ref 0.8–1.2)
Prothrombin Time: 13.5 seconds (ref 11.4–15.2)

## 2019-05-09 LAB — POC SARS CORONAVIRUS 2 AG -  ED
SARS Coronavirus 2 Ag: NEGATIVE
SARS Coronavirus 2 Ag: NEGATIVE

## 2019-05-09 LAB — MAGNESIUM: Magnesium: 1.8 mg/dL (ref 1.7–2.4)

## 2019-05-09 LAB — SARS CORONAVIRUS 2 BY RT PCR (HOSPITAL ORDER, PERFORMED IN ~~LOC~~ HOSPITAL LAB): SARS Coronavirus 2: NEGATIVE

## 2019-05-09 LAB — HEMOGLOBIN A1C
Hgb A1c MFr Bld: 6.4 % — ABNORMAL HIGH (ref 4.8–5.6)
Mean Plasma Glucose: 136.98 mg/dL

## 2019-05-09 MED ORDER — APIXABAN 5 MG PO TABS
5.0000 mg | ORAL_TABLET | Freq: Two times a day (BID) | ORAL | Status: DC
  Administered 2019-05-09 – 2019-05-15 (×13): 5 mg via ORAL
  Filled 2019-05-09 (×13): qty 1

## 2019-05-09 MED ORDER — TIZANIDINE HCL 2 MG PO TABS
4.0000 mg | ORAL_TABLET | Freq: Every evening | ORAL | Status: DC | PRN
  Administered 2019-05-13: 23:00:00 4 mg via ORAL
  Filled 2019-05-09: qty 2

## 2019-05-09 MED ORDER — METOPROLOL TARTRATE 25 MG PO TABS
12.5000 mg | ORAL_TABLET | Freq: Two times a day (BID) | ORAL | Status: DC
  Administered 2019-05-09: 22:00:00 12.5 mg via ORAL
  Filled 2019-05-09: qty 1

## 2019-05-09 MED ORDER — ISOSORBIDE MONONITRATE ER 30 MG PO TB24
30.0000 mg | ORAL_TABLET | Freq: Every evening | ORAL | Status: DC
  Filled 2019-05-09: qty 1

## 2019-05-09 MED ORDER — GABAPENTIN 100 MG PO CAPS: 200.0000 mg | ORAL_CAPSULE | Freq: Every evening | ORAL | Status: DC | PRN

## 2019-05-09 MED ORDER — ACETAMINOPHEN 325 MG PO TABS: 650.0000 mg | ORAL_TABLET | Freq: Four times a day (QID) | ORAL | Status: DC | PRN

## 2019-05-09 MED ORDER — ATORVASTATIN CALCIUM 40 MG PO TABS
80.0000 mg | ORAL_TABLET | Freq: Every day | ORAL | Status: DC
  Administered 2019-05-09 – 2019-05-15 (×7): 80 mg via ORAL
  Filled 2019-05-09 (×7): qty 2

## 2019-05-09 MED ORDER — ASPIRIN EC 81 MG PO TBEC
81.0000 mg | DELAYED_RELEASE_TABLET | Freq: Every day | ORAL | Status: DC
  Administered 2019-05-09 – 2019-05-13 (×5): 81 mg via ORAL
  Filled 2019-05-09 (×5): qty 1

## 2019-05-09 MED ORDER — SODIUM CHLORIDE 0.9% FLUSH: 3.0000 mL | INTRAVENOUS | Status: DC | PRN

## 2019-05-09 MED ORDER — DILTIAZEM HCL-DEXTROSE 125-5 MG/125ML-% IV SOLN (PREMIX)
5.0000 mg/h | INTRAVENOUS | Status: DC
  Administered 2019-05-09: 5 mg/h via INTRAVENOUS
  Filled 2019-05-09: qty 125

## 2019-05-09 MED ORDER — AMITRIPTYLINE HCL 25 MG PO TABS
25.0000 mg | ORAL_TABLET | Freq: Every day | ORAL | Status: DC
  Administered 2019-05-09 – 2019-05-15 (×6): 25 mg via ORAL
  Filled 2019-05-09 (×7): qty 1

## 2019-05-09 MED ORDER — ALBUTEROL SULFATE HFA 108 (90 BASE) MCG/ACT IN AERS
1.0000 | INHALATION_SPRAY | Freq: Four times a day (QID) | RESPIRATORY_TRACT | Status: DC | PRN
  Filled 2019-05-09 (×2): qty 6.7

## 2019-05-09 MED ORDER — METOPROLOL TARTRATE 50 MG PO TABS
50.0000 mg | ORAL_TABLET | Freq: Once | ORAL | Status: AC
  Administered 2019-05-09: 50 mg via ORAL
  Filled 2019-05-09: qty 1

## 2019-05-09 MED ORDER — INSULIN ASPART 100 UNIT/ML ~~LOC~~ SOLN
0.0000 [IU] | Freq: Three times a day (TID) | SUBCUTANEOUS | Status: DC
  Administered 2019-05-12 – 2019-05-14 (×3): 1 [IU] via SUBCUTANEOUS

## 2019-05-09 MED ORDER — SEVELAMER CARBONATE 800 MG PO TABS
1600.0000 mg | ORAL_TABLET | Freq: Three times a day (TID) | ORAL | Status: DC
  Administered 2019-05-10 – 2019-05-15 (×12): 1600 mg via ORAL
  Filled 2019-05-09 (×17): qty 2

## 2019-05-09 MED ORDER — DILTIAZEM LOAD VIA INFUSION
10.0000 mg | Freq: Once | INTRAVENOUS | Status: AC
  Administered 2019-05-09: 10 mg via INTRAVENOUS
  Filled 2019-05-09: qty 10

## 2019-05-09 MED ORDER — SODIUM CHLORIDE 0.9% FLUSH
3.0000 mL | Freq: Two times a day (BID) | INTRAVENOUS | Status: DC
  Administered 2019-05-09 – 2019-05-15 (×12): 3 mL via INTRAVENOUS

## 2019-05-09 MED ORDER — HYDRALAZINE HCL 25 MG PO TABS
100.0000 mg | ORAL_TABLET | Freq: Three times a day (TID) | ORAL | Status: DC
  Administered 2019-05-09: 16:00:00 100 mg via ORAL
  Filled 2019-05-09 (×3): qty 4

## 2019-05-09 MED ORDER — DOCUSATE SODIUM 100 MG PO CAPS
100.0000 mg | ORAL_CAPSULE | ORAL | Status: DC
  Administered 2019-05-09 – 2019-05-15 (×4): 100 mg via ORAL
  Filled 2019-05-09 (×6): qty 1

## 2019-05-09 MED ORDER — ACETAMINOPHEN 650 MG RE SUPP: 650.0000 mg | Freq: Four times a day (QID) | RECTAL | Status: DC | PRN

## 2019-05-09 MED ORDER — CARVEDILOL 3.125 MG PO TABS
6.2500 mg | ORAL_TABLET | Freq: Two times a day (BID) | ORAL | Status: DC
  Administered 2019-05-09: 6.25 mg via ORAL
  Filled 2019-05-09: qty 1

## 2019-05-09 MED ORDER — POLYETHYLENE GLYCOL 3350 17 G PO PACK: 17.0000 g | PACK | Freq: Every day | ORAL | Status: DC | PRN

## 2019-05-09 MED ORDER — ONDANSETRON HCL 4 MG/2ML IJ SOLN
4.0000 mg | Freq: Four times a day (QID) | INTRAMUSCULAR | Status: DC | PRN
  Administered 2019-05-14: 4 mg via INTRAVENOUS
  Filled 2019-05-09: qty 2

## 2019-05-09 MED ORDER — SODIUM CHLORIDE 0.9 % IV SOLN: 250.0000 mL | INTRAVENOUS | Status: DC | PRN

## 2019-05-09 MED ORDER — INSULIN ASPART 100 UNIT/ML ~~LOC~~ SOLN: 0.0000 [IU] | Freq: Every day | SUBCUTANEOUS | Status: DC

## 2019-05-09 MED ORDER — NITROGLYCERIN 0.4 MG SL SUBL
0.4000 mg | SUBLINGUAL_TABLET | SUBLINGUAL | Status: DC | PRN
  Administered 2019-05-12: 0.4 mg via SUBLINGUAL
  Filled 2019-05-09: qty 1

## 2019-05-09 MED ORDER — FERRIC CITRATE 1 GM 210 MG(FE) PO TABS
420.0000 mg | ORAL_TABLET | Freq: Three times a day (TID) | ORAL | Status: DC
  Administered 2019-05-10 – 2019-05-15 (×12): 420 mg via ORAL
  Filled 2019-05-09 (×24): qty 2

## 2019-05-09 MED ORDER — ONDANSETRON HCL 4 MG PO TABS: 4.0000 mg | ORAL_TABLET | Freq: Four times a day (QID) | ORAL | Status: DC | PRN

## 2019-05-09 MED ORDER — CALCIUM CARBONATE-VITAMIN D 500-200 MG-UNIT PO TABS
1.0000 | ORAL_TABLET | Freq: Every day | ORAL | Status: DC
  Administered 2019-05-09 – 2019-05-15 (×7): 1 via ORAL
  Filled 2019-05-09 (×8): qty 1

## 2019-05-09 MED ORDER — POTASSIUM CHLORIDE CRYS ER 20 MEQ PO TBCR
40.0000 meq | EXTENDED_RELEASE_TABLET | Freq: Once | ORAL | Status: AC
  Administered 2019-05-09: 40 meq via ORAL
  Filled 2019-05-09: qty 2

## 2019-05-09 NOTE — Progress Notes (Signed)
*  PRELIMINARY RESULTS* Echocardiogram 2D Echocardiogram has been performed.  Alexis [name] 05/09/2019, 3:00 PM

## 2019-05-09 NOTE — H&P (Signed)
History and Physical    Alexis [name]:[id number] DOB: 01-Apr-1946 DOA: 05/09/2019  PCP: System, Provider Not In   Patient coming from: Dialysis center  Chief Complaint: New onset atrial fibrillation with RVR noted in dialysis  HPI: Alexis [name] is a 73 y.o. female with medical history significant for obesity, ESRD on HD TTS, dyslipidemia, hypertension, type 2 diabetes, and CHF with LVEF 40-45% with grade 2 diastolic dysfunction who was brought to the ED today from her dialysis facility after she was noted to be tachycardic.  She was able to complete her entire 4-hour dialysis session without any difficulties and she was otherwise asymptomatic.  She denies any shortness of breath, chest pain, fever, chills, abdominal pain, nausea, or vomiting.  She takes her usual home medications as prescribed with no other difficulties.  She has no prior knowledge of having atrial fibrillation.   ED Course: Vital signs demonstrate tachycardia with atrial fibrillation on telemetry.  No fever or other abnormalities noted.  Patient was given oral metoprolol without any response and will be started on Cardizem bolus and drip.  She is currently on room air with COVID-19 study pending.  TSH within normal limits.  Her potassium is 3 and platelets are 148.  Review of Systems: All others reviewed as noted above and otherwise negative.  Past Medical History:  Diagnosis Date  . Arthritis   . Cancer [facility]) 2005    left breast  . CHF (congestive heart failure) (HCC)   . Chronic back pain   . Chronic kidney disease 03/2014   dialysis t/th/sa  . Coronary artery disease   . Diabetes mellitus    Type 2  . Diabetic nephropathy (HCC) 01-08-13  . Dyslipidemia 01-08-13  . ESRD on hemodialysis (HCC)    Tu, Th, Sat  . Headache   . Hyperlipidemia   . Hypertension   . LBBB (left bundle branch block)   . Proteinuria 01-08-13  . Pulmonary hypertension (HCC)    PA peak pressure 73 mmHg 08/05/17 echo  . Thyroid disease  01-08-13   Hyper-parathyroidism-secondary  . Wears glasses     Past Surgical History:  Procedure Laterality Date  . A/V FISTULAGRAM N/A 08/25/2017   Procedure: A/V FISTULAGRAM - Left Arm;  Surgeon: Chuck Hint, MD;  Location: [facility] INVASIVE CV LAB;  Service: Cardiovascular;  Laterality: N/A;  . A/V FISTULAGRAM N/A 02/09/2018   Procedure: A/V [id number];  Surgeon: Sherren Kerns, MD;  Location: [facility] INVASIVE CV LAB;  Service: Cardiovascular;  Laterality: N/A;  . A/V FISTULAGRAM Left 04/04/2018   Procedure: A/V FISTULAGRAM;  Surgeon: Cephus Shelling, MD;  Location: [facility] INVASIVE CV LAB;  Service: Cardiovascular;  Laterality: Left;  . ABDOMINAL AORTAGRAM N/A 08/11/2014   Procedure: ABDOMINAL Ronny Flurry;  Surgeon: Chuck Hint, MD;  Location: [facility] CATH LAB;  Service: Cardiovascular;  Laterality: N/A;  . ABDOMINAL HYSTERECTOMY    . APPENDECTOMY    . Arm surgery     Left arm trauma  . AV FISTULA PLACEMENT Left 08/21/2014   Procedure: LEFT ARM ARTERIOVENOUS (AV) FISTULA CREATION ;  Surgeon: Chuck Hint, MD;  Location: [facility] OR;  Service: Vascular;  Laterality: Left;  . AV FISTULA PLACEMENT Right 06/13/2018   Procedure: insertion of right arm ARTERIOVENOUS (AV) gore-tex GRAFT;  Surgeon: Larina Earthly, MD;  Location: [facility] OR;  Service: Vascular;  Laterality: Right;  . BACK SURGERY    . CATARACT EXTRACTION W/PHACO Left 12/22/2014   Procedure: CATARACT EXTRACTION PHACO AND INTRAOCULAR LENS  PLACEMENT (IOC);  Surgeon: Gemma Payor, MD;  Location: [facility] ORS;  Service: Ophthalmology;  Laterality: Left;  CDE 13.48  . CORONARY ANGIOPLASTY  12/19/2003   inferior wall hypokinesis. ef 50%  . dialysis catheter    . INSERTION OF DIALYSIS CATHETER Right 06/13/2018   Procedure: INSERTION OF DIALYSIS CATHETER, right internal jugular;  Surgeon: Larina Earthly, MD;  Location: [facility] OR;  Service: Vascular;  Laterality: Right;  . IR FLUORO GUIDE CV LINE RIGHT  04/06/2018  . IR REMOVAL TUN CV CATH W/O FL   04/25/2018  . IR REMOVAL TUN CV CATH W/O FL  07/20/2018  . IR US GUIDE VASC ACCESS RIGHT  04/06/2018  . LIGATION OF ARTERIOVENOUS  FISTULA Left 03/18/2019   Procedure: LIGATION OF ARTERIOVENOUS  FISTULA  LEFT ARM;  Surgeon: Chuck Hint, MD;  Location: [facility] OR;  Service: Vascular;  Laterality: Left;  Marland Kitchen MASTECTOMY     Left  . PERIPHERAL VASCULAR BALLOON ANGIOPLASTY Left 08/25/2017   Procedure: PERIPHERAL VASCULAR BALLOON ANGIOPLASTY;  Surgeon: Chuck Hint, MD;  Location: [facility] INVASIVE CV LAB;  Service: Cardiovascular;  Laterality: Left;  arm fistula  . PERIPHERAL VASCULAR BALLOON ANGIOPLASTY Left 02/09/2018   Procedure: PERIPHERAL VASCULAR BALLOON ANGIOPLASTY;  Surgeon: Sherren Kerns, MD;  Location: [facility] INVASIVE CV LAB;  Service: Cardiovascular;  Laterality: Left;  AV Fistula  . PERIPHERAL VASCULAR BALLOON ANGIOPLASTY Left 04/04/2018   Procedure: PERIPHERAL VASCULAR BALLOON ANGIOPLASTY;  Surgeon: Cephus Shelling, MD;  Location: [facility] INVASIVE CV LAB;  Service: Cardiovascular;  Laterality: Left;  UPPER ARM FISTULA  . PERIPHERAL VASCULAR CATHETERIZATION N/A 09/16/2015   Procedure: Fistulagram;  Surgeon: Larina Earthly, MD;  Location: [facility] INVASIVE CV LAB;  Service: Cardiovascular;  Laterality: N/A;  . SPINE SURGERY    . TONSILLECTOMY    . TRANSTHORACIC ECHOCARDIOGRAM  10/01/2010    Left ventricle: The cavity size was mildly dilated. Wall thickness was increased in a pattern of mild LVH. Systolic function was   mildly reduced. The estimated ejection fraction was in the range  of 45% to 50%.   . TUBAL LIGATION       reports that she quit smoking about 20 years ago. Her smoking use included cigarettes. She has a 50.00 pack-year smoking history. She has never used smokeless tobacco. She reports that she does not drink alcohol or use drugs.  Allergies  Allergen Reactions  . Olmesartan Other (See Comments)    Hyperkalemia     Family History  Problem Relation Age of Onset  . Breast  cancer Mother        Died age 51  . Cancer Mother 56       Breast  . Heart disease Mother   . Hyperlipidemia Mother   . Hypertension Mother   . Heart attack Mother     Prior to Admission medications   Medication Sig Start Date End Date Taking? Authorizing Provider  acetaminophen (TYLENOL) 325 MG tablet Take 650 mg by mouth every 6 (six) hours as needed for moderate pain.   Yes [provider]  amitriptyline (ELAVIL) 25 MG tablet Take 25 mg by mouth at bedtime.   Yes [provider]  aspirin EC 81 MG EC tablet Take 1 tablet (81 mg total) by mouth daily. 08/05/17  Yes Osei-Bonsu, Greggory Stallion, MD  atorvastatin (LIPITOR) 80 MG tablet Take 80 mg by mouth daily.   Yes [provider]  AURYXIA 1 GM 210 MG(Fe) tablet Take 420 mg by mouth 3 (three) times  daily with meals. Take 2 tablets (420 mg) by mouth 3 times daily with meals 07/25/17  Yes [provider]  calcitRIOL (ROCALTROL) 0.25 MCG capsule Take 0.25 mcg by mouth 3 (three) times a week. On Dialysis days Pat Kocher, Sat. 03/26/14  Yes [provider]  Calcium-Vitamin D-Vitamin K 500-500-40 MG-UNT-MCG CHEW Chew 1 tablet by mouth daily.   Yes [provider]  carvedilol (COREG) 6.25 MG tablet Take 6.25 mg by mouth 2 (two) times daily. 01/17/18  Yes [provider]  diclofenac sodium (VOLTAREN) 1 % GEL Apply 1 application topically 2 (two) times daily as needed (pain.).    Yes [provider]  docusate sodium (COLACE) 100 MG capsule Take 100 mg by mouth every other day.   Yes [provider]  gabapentin (NEURONTIN) 100 MG capsule Take 200 mg by mouth at bedtime as needed (pain).  01/17/18  Yes [provider]  hydrALAZINE (APRESOLINE) 100 MG tablet Take 100 mg by mouth 3 (three) times daily.   Yes [provider]  isosorbide mononitrate (IMDUR) 30 MG 24 hr tablet Take 30 mg by mouth every evening.    Yes [provider]  lidocaine-prilocaine (EMLA)  cream Apply 1 application topically daily as needed (prior to port being accessed).    Yes [provider]  nitroGLYCERIN (NITROSTAT) 0.4 MG SL tablet Place 0.4 mg under the tongue every 5 (five) minutes as needed for chest pain.   Yes [provider]  NOVOLOG MIX 70/30 FLEXPEN (70-30) 100 UNIT/ML FlexPen Inject 55 Units into the skin daily at 6 PM. 1900 01/17/18  Yes [provider]  sevelamer carbonate (RENVELA) 800 MG tablet Take 1,600 mg by mouth 3 (three) times daily with meals.   Yes [provider]  tiZANidine (ZANAFLEX) 4 MG tablet Take 4 mg by mouth at bedtime as needed for muscle spasms. 08/22/17  Yes [provider]  triamcinolone cream (KENALOG) 0.5 % Apply 1 application topically 2 (two) times daily as needed (rash).   Yes [provider]  albuterol (VENTOLIN HFA) 108 (90 Base) MCG/ACT inhaler Inhale 1-2 puffs into the lungs every 6 (six) hours as needed for wheezing or shortness of breath.    [provider]  HYDROcodone-acetaminophen (NORCO/VICODIN) 5-325 MG tablet Take 2 tablets by mouth every 6 (six) hours as needed for moderate pain or severe pain. Patient not taking: Reported on 05/09/2019 06/13/18   Lars Mage, PA-C  oxyCODONE-acetaminophen (PERCOCET) 5-325 MG tablet Take 1 tablet by mouth every 4 (four) hours as needed for severe pain. Patient not taking: Reported on 05/09/2019 03/18/19 03/17/20  Chuck Hint, MD  polyethylene glycol [facility] Ethelene Hal) packet Take 17 g by mouth daily as needed for mild constipation.     [provider]    Physical Exam: Vitals:   05/09/19 1046 05/09/19 1100 05/09/19 1130 05/09/19 1200  BP: 132/62 123/62 107/76 113/64  Pulse: (!) 115 (!) 104 (!) 135 (!) 125  Resp: 19 18 20 15   Temp: 98.9 F (37.2 C)     TempSrc: Oral     SpO2: 98% 96% 97% 96%  Weight:      Height:        Constitutional: NAD, calm, comfortable, obese Vitals:   05/09/19 1046 05/09/19 1100  05/09/19 1130 05/09/19 1200  BP: 132/62 123/62 107/76 113/64  Pulse: (!) 115 (!) 104 (!) 135 (!) 125  Resp: 19 18 20 15   Temp: 98.9 F (37.2 C)  TempSrc: Oral     SpO2: 98% 96% 97% 96%  Weight:      Height:       Eyes: lids and conjunctivae normal ENMT: Mucous membranes are moist.  Neck: normal, supple Respiratory: clear to auscultation bilaterally. Normal respiratory effort. No accessory muscle use.  Currently on room air Cardiovascular: Irregular and tachycardic, no murmurs. No extremity edema. Abdomen: no tenderness, no distention. Bowel sounds positive.  Musculoskeletal:  No joint deformity upper and lower extremities.   Skin: no rashes, lesions, ulcers.  Psychiatric: Normal judgment and insight. Alert and oriented x 3. Normal mood.   Labs on Admission: I have personally reviewed following labs and imaging studies  CBC: Recent Labs  Lab 05/09/19 1134  WBC 8.5  HGB 11.8*  HCT 37.2  MCV 80.7  PLT 148*   Basic Metabolic Panel: Recent Labs  Lab 05/09/19 1134  NA 136  K 3.0*  CL 97*  CO2 26  GLUCOSE 83  BUN 16  CREATININE 4.35*  CALCIUM 8.6*  MG 1.8   GFR: Estimated Creatinine Clearance: 15.5 mL/min (A) (by C-G formula based on SCr of 4.35 mg/dL (H)). Liver Function Tests: No results for input(s): AST, ALT, ALKPHOS, BILITOT, PROT, ALBUMIN in the last 168 hours. No results for input(s): LIPASE, AMYLASE in the last 168 hours. No results for input(s): AMMONIA in the last 168 hours. Coagulation Profile: Recent Labs  Lab 05/09/19 1134  INR 1.0   Cardiac Enzymes: No results for input(s): CKTOTAL, CKMB, CKMBINDEX, TROPONINI in the last 168 hours. BNP (last 3 results) No results for input(s): PROBNP in the last 8760 hours. HbA1C: No results for input(s): HGBA1C in the last 72 hours. CBG: No results for input(s): GLUCAP in the last 168 hours. Lipid Profile: No results for input(s): CHOL, HDL, LDLCALC, TRIG, CHOLHDL, LDLDIRECT in the last 72 hours. Thyroid  Function Tests: Recent Labs    05/09/19 1134  TSH 1.267   Anemia Panel: No results for input(s): VITAMINB12, FOLATE, FERRITIN, TIBC, IRON, RETICCTPCT in the last 72 hours. Urine analysis:    Component Value Date/Time   COLORURINE YELLOW 08/19/2012 1126   APPEARANCEUR CLOUDY (A) 08/19/2012 1126   LABSPEC 1.018 08/19/2012 1126   PHURINE 5.0 08/19/2012 1126   GLUCOSEU NEGATIVE 08/19/2012 1126   HGBUR SMALL (A) 08/19/2012 1126   BILIRUBINUR NEGATIVE 08/19/2012 1126   KETONESUR NEGATIVE 08/19/2012 1126   PROTEINUR >300 (A) 08/19/2012 1126   UROBILINOGEN 0.2 08/19/2012 1126   NITRITE NEGATIVE 08/19/2012 1126   LEUKOCYTESUR SMALL (A) 08/19/2012 1126    Radiological Exams on Admission: DG Chest Port 1 View  Result Date: 05/09/2019 CLINICAL DATA:  Acute onset of atrial fibrillation after hemodialysis earlier today. EXAM: PORTABLE CHEST 1 VIEW COMPARISON:  09/07/2018 and earlier. FINDINGS: Cardiac silhouette moderately to markedly enlarged, unchanged. Pulmonary vascularity normal. Lungs clear. No pleural effusions. Stable chronic mild elevation of the RIGHT hemidiaphragm. IMPRESSION: 1. Stable moderate to marked cardiomegaly. No acute cardiopulmonary disease. 2. Stable chronic elevation of the RIGHT hemidiaphragm. Electronically Signed   By: Hulan Saas M.D.   On: 05/09/2019 11:23    EKG: Independently reviewed. Afib 104bpm.  Assessment/Plan Active Problems:   Atrial fibrillation with RVR (HCC)    New onset atrial fibrillation with RVR-asymptomatic -Patient did not respond to oral metoprolol and will be started on Cardizem infusion -Monitor closely on telemetry -TSH 1.267 -We will order 2D echocardiogram with prior done on 07/2017 with LVEF 40-45% and grade 2 diastolic dysfunction -We will plan to wean  to oral medications with cardiology outpatient on discharge -CHA2DS2-VASc score of 3 and will plan to start on Eliquis  ESRD on HD TTS -Patient completed hemodialysis today  and will require further hemodialysis on 12/26 -We will plan to notify nephrology at that point  Mild hypokalemia -Replete orally and recheck in a.m.  History of combined CHF -No signs of active decompensation currently noted and will recheck 2D echocardiogram  Type 2 diabetes -Currently without hyperglycemia -Maintain on NovoLog 70/30 as well as SSI  Dyslipidemia -Maintain on statin  Obesity -Lifestyle modification  Hypertension -Currently stable, continue home meds  DVT prophylaxis: Plan to start Eliquis Code Status: Full Family Communication: None at bedside Disposition Plan: Admit for A. fib with RVR evaluation Consults called: None Admission status: Inpatient, stepdown unit   Maurene Hollin D Sherryll Burger DO [facility] Pager [phone]  If 7PM-7AM, please contact night-coverage [email or web address] Password [email or web address]  05/09/2019, 1:27 PM

## 2019-05-09 NOTE — ED Notes (Signed)
Pt repositioned and set up in bed for lunch tray, informed pt her daughter called and would call her back after she eats

## 2019-05-09 NOTE — Progress Notes (Signed)
ANTICOAGULATION CONSULT NOTE - Initial Consult  Pharmacy Consult for apixaban Indication: atrial fibrillation  Allergies  Allergen Reactions  . Olmesartan Other (See Comments)    Hyperkalemia     Patient Measurements: Height: 5\' 6"  (167.6 cm) Weight: 274 lb (124.3 kg) IBW/kg (Calculated) : 59.3   Vital Signs: Temp: 98.9 F (37.2 C) (12/24 1046) Temp Source: Oral (12/24 1046) BP: 113/64 (12/24 1200) Pulse Rate: 125 (12/24 1200)  Labs: Recent Labs    05/09/19 1134  HGB 11.8*  HCT 37.2  PLT 148*  APTT 31  LABPROT 13.5  INR 1.0  CREATININE 4.35*    Estimated Creatinine Clearance: 15.5 mL/min (A) (by C-G formula based on SCr of 4.35 mg/dL (H)).   Medical History: Past Medical History:  Diagnosis Date  . Arthritis   . Cancer [facility]) 2005    left breast  . CHF (congestive heart failure) ([place])   . Chronic back pain   . Chronic kidney disease 03/2014   dialysis t/th/sa  . Coronary artery disease   . Diabetes mellitus    Type 2  . Diabetic nephropathy ([place]) 01-08-13  . Dyslipidemia 01-08-13  . ESRD on hemodialysis ([name])    Tu, Th, Sat  . Headache   . Hyperlipidemia   . Hypertension   . LBBB (left bundle branch block)   . Proteinuria 01-08-13  . Pulmonary hypertension ([place])    PA peak pressure 73 mmHg 08/05/17 echo  . Thyroid disease 01-08-13   Hyper-parathyroidism-secondary  . Wears glasses     Medications:  (Not in a hospital admission)   Assessment: Pharmacy consulted to dose apixaban in patient with atrial fibrillation.  Patient is not on anticoagulation prior to admission. CBC WNL  Goal of Therapy:   Monitor platelets by anticoagulation protocol: Yes   Plan:  Apixaban 5 mg twice daily. Monitor labs and s/s of bleeding.  Revonda Standard Teralyn Mullins 05/09/2019,2:01 PM

## 2019-05-09 NOTE — ED Notes (Signed)
Went in to give pt her meds and pt was very upset stating she has been calling out for 30 mins and noone came; I informed pt that I was sorry but I came as quick as I could due to my other pts I was not able to come at the time she called; pt then stated someone could have come, I then informed pt that the other nurses have been busy as well.  Pt stated she was not going to take her medications and that she was leaving.  I informed pt I would get in touch with her doctor

## 2019-05-09 NOTE — ED Triage Notes (Signed)
Pt received dialysis today and they noticed some tachycardia.  EMS arrived and says pt in afib rate 120's.  Denies history of afib.

## 2019-05-09 NOTE — ED Provider Notes (Signed)
[facility] EMERGENCY DEPARTMENT Provider Note   CSN: [id number] Arrival date & time: 05/09/19  1038     History Chief Complaint  Patient presents with  . Atrial Fibrillation    Alexis [name] is a 73 y.o. female.  HPI   This patient is a 73 year old female with a known history of end-stage renal disease, diabetes, she is on dialysis and was at dialysis today when she was noted to be tachycardic.  She was able to complete the entire 4-hour dialysis session without any difficulties, she is asymptomatic from the tachycardia but the paramedics noted that she was in atrial fibrillation with rapid ventricular rate in the 120s range.  The patient has no prior history of cardiac disease that she knows of though congestive heart failure is listed in her diagnoses.  She also has a history of pulmonary hypertension.  Medical record reviewed at length, the patient is taking multiple medications on a daily basis, she does take Coreg, hydralazine, Imdur, and insulin among multiple other medications as well.  She denies fevers, chest pain, coughing or shortness of breath and has had no significant swelling of her legs.  Recently she has been feeling well.  She was unaware that she had atrial fibrillation, no medications given prehospital.  Past Medical History:  Diagnosis Date  . Arthritis   . Cancer [facility]) 2005    left breast  . CHF (congestive heart failure) ([place])   . Chronic back pain   . Chronic kidney disease 03/2014   dialysis t/th/sa  . Coronary artery disease   . Diabetes mellitus    Type 2  . Diabetic nephropathy ([place]) 01-08-13  . Dyslipidemia 01-08-13  . ESRD on hemodialysis ([place])    Tu, Th, Sat  . Headache   . Hyperlipidemia   . Hypertension   . LBBB (left bundle branch block)   . Proteinuria 01-08-13  . Pulmonary hypertension ([place])    PA peak pressure 73 mmHg 08/05/17 echo  . Thyroid disease 01-08-13   Hyper-parathyroidism-secondary  . Wears glasses     Patient Active  Problem List   Diagnosis Date Noted  . Elevated troponin   . ESRD on hemodialysis ([place])   . ESRD (end stage renal disease) ([place]) 08/13/2017  . Dyspnea 08/13/2017  . Chest pain 08/04/2017  . 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 Essential hypertension 08/15/2012  . CAD (coronary artery disease) 03/25/2011  . Type II diabetes mellitus with manifestations ([place]) 03/25/2011  . Chest pain 03/25/2011  . CHF (congestive heart failure) ([place]) 10/14/2010  . CKD (chronic kidney disease) 10/14/2010  . Obesity 10/14/2010    Past Surgical History:  Procedure Laterality Date  . A/V FISTULAGRAM N/A 08/25/2017   Procedure: A/V FISTULAGRAM - Left Arm;  Surgeon: Angelia Mould, MD;  Location: [place] CV LAB;  Service: Cardiovascular;  Laterality: N/A;  . A/V FISTULAGRAM N/A 02/09/2018   Procedure: A/V [id number];  Surgeon: Elam Dutch, MD;  Location: [place] CV LAB;  Service: Cardiovascular;  Laterality: N/A;  . A/V FISTULAGRAM Left 04/04/2018   Procedure: A/V FISTULAGRAM;  Surgeon: Marty Heck, MD;  Location: [place] CV LAB;  Service: Cardiovascular;  Laterality: Left;  . ABDOMINAL AORTAGRAM N/A 08/11/2014   Procedure: ABDOMINAL Maxcine Ham;  Surgeon: Angelia Mould, MD;  Location: [facility] CATH LAB;  Service: Cardiovascular;  Laterality: N/A;  . ABDOMINAL HYSTERECTOMY    . APPENDECTOMY    . Arm surgery     Left  arm trauma  . AV FISTULA PLACEMENT Left 08/21/2014   Procedure: LEFT ARM ARTERIOVENOUS (AV) FISTULA CREATION ;  Surgeon: Angelia Mould, MD;  Location[facility] OR;  Service: Vascular;  Laterality: Left;  . AV FISTULA PLACEMENT Right 06/13/2018   Procedure: insertion of right arm ARTERIOVENOUS (AV) gore-tex GRAFT;  Surgeon: Rosetta Posner, MD;  Location: [place];  Service: Vascular;  Laterality: Right;  . BACK SURGERY    . CATARACT EXTRACTION W/PHACO Left 12/22/2014   Procedure: CATARACT EXTRACTION PHACO AND INTRAOCULAR LENS  PLACEMENT (IOC);  Surgeon: Tonny Branch, MD;  Location: [facility];  Service: Ophthalmology;  Laterality: Left;  CDE 13.48  . CORONARY ANGIOPLASTY  12/19/2003   inferior wall hypokinesis. ef 50%  . dialysis catheter    . INSERTION OF DIALYSIS CATHETER Right 06/13/2018   Procedure: INSERTION OF DIALYSIS CATHETER, right internal jugular;  Surgeon: Rosetta Posner, MD;  Location: [place];  Service: Vascular;  Laterality: Right;  . IR FLUORO GUIDE CV LINE RIGHT  04/06/2018  . IR REMOVAL TUN CV CATH W/O FL  04/25/2018  . IR REMOVAL TUN CV CATH W/O FL  07/20/2018  . IR US GUIDE VASC ACCESS RIGHT  04/06/2018  . LIGATION OF ARTERIOVENOUS  FISTULA Left 03/18/2019   Procedure: LIGATION OF ARTERIOVENOUS  FISTULA  LEFT ARM;  Surgeon: Angelia Mould, MD;  Location: [place];  Service: Vascular;  Laterality: Left;  Marland Kitchen MASTECTOMY     Left  . PERIPHERAL VASCULAR BALLOON ANGIOPLASTY Left 08/25/2017   Procedure: PERIPHERAL VASCULAR BALLOON ANGIOPLASTY;  Surgeon: Angelia Mould, MD;  Location: [place] CV LAB;  Service: Cardiovascular;  Laterality: Left;  arm fistula  . PERIPHERAL VASCULAR BALLOON ANGIOPLASTY Left 02/09/2018   Procedure: PERIPHERAL VASCULAR BALLOON ANGIOPLASTY;  Surgeon: Elam Dutch, MD;  Location: [place] CV LAB;  Service: Cardiovascular;  Laterality: Left;  AV Fistula  . PERIPHERAL VASCULAR BALLOON ANGIOPLASTY Left 04/04/2018   Procedure: PERIPHERAL VASCULAR BALLOON ANGIOPLASTY;  Surgeon: Marty Heck, MD;  Location: [place] CV LAB;  Service: Cardiovascular;  Laterality: Left;  UPPER ARM FISTULA  . PERIPHERAL VASCULAR CATHETERIZATION N/A 09/16/2015   Procedure: Fistulagram;  Surgeon: Rosetta Posner, MD;  Location: [place] CV LAB;  Service: Cardiovascular;  Laterality: N/A;  . SPINE SURGERY    . TONSILLECTOMY    . TRANSTHORACIC ECHOCARDIOGRAM  10/01/2010    Left ventricle: The cavity size was mildly dilated. Wall thickness was increased in a pattern of mild LVH. Systolic  function was   mildly reduced. The estimated ejection fraction was in the range  of 45% to 50%.   . TUBAL LIGATION       OB History   No obstetric history on file.     Family History  Problem Relation Age of Onset  . Breast cancer Mother        Died age 1  . Cancer Mother 90       Breast  . Heart disease Mother   . Hyperlipidemia Mother   . Hypertension Mother   . Heart attack Mother     Social History   Tobacco Use  . Smoking status: Former Smoker    Packs/day: 1.00    Years: 50.00    Pack years: 50.00    Types: Cigarettes    Quit date: 05/16/1998    Years since quitting: 20.9  . Smokeless tobacco: Never Used  Substance Use Topics  . Alcohol use: No    Alcohol/week: 0.0 standard drinks  .  Drug use: No    Home Medications Prior to Admission medications   Medication Sig Start Date End Date Taking? Authorizing Provider  acetaminophen (TYLENOL) 325 MG tablet Take 650 mg by mouth every 6 (six) hours as needed for moderate pain.    [provider]  albuterol (VENTOLIN HFA) 108 (90 Base) MCG/ACT inhaler Inhale 1-2 puffs into the lungs every 6 (six) hours as needed for wheezing or shortness of breath.    [provider]  amitriptyline (ELAVIL) 25 MG tablet Take 25 mg by mouth at bedtime.    [provider]  aspirin EC 81 MG EC tablet Take 1 tablet (81 mg total) by mouth daily. 08/05/17   Benito Mccreedy, MD  atorvastatin (LIPITOR) 80 MG tablet Take 80 mg by mouth daily.    [provider]  AURYXIA 1 GM 210 MG(Fe) tablet Take 420 mg by mouth 3 (three) times daily with meals. Take 2 tablets (420 mg) by mouth 3 times daily with meals 07/25/17   [provider]  calcitRIOL (ROCALTROL) 0.25 MCG capsule Take 0.25 mcg by mouth 3 (three) times a week. On Dialysis days Harrell Lark, Sat. 03/26/14   [provider]  Calcium-Vitamin D-Vitamin K 500-500-40 MG-UNT-MCG CHEW Chew 1 tablet by mouth daily.    [provider]    carvedilol (COREG) 6.25 MG tablet Take 6.25 mg by mouth 2 (two) times daily. 01/17/18   [provider]  diclofenac sodium (VOLTAREN) 1 % GEL Apply 1 application topically 2 (two) times daily as needed (pain.).     [provider]  docusate sodium (COLACE) 100 MG capsule Take 100 mg by mouth every other day.    [provider]  gabapentin (NEURONTIN) 100 MG capsule Take 200 mg by mouth at bedtime as needed (pain).  01/17/18   [provider]  hydrALAZINE (APRESOLINE) 100 MG tablet Take 100 mg by mouth 3 (three) times daily.    [provider]  HYDROcodone-acetaminophen (NORCO/VICODIN) 5-325 MG tablet Take 2 tablets by mouth every 6 (six) hours as needed for moderate pain or severe pain. 06/13/18   Ulyses Amor, PA-C  isosorbide mononitrate (IMDUR) 30 MG 24 hr tablet Take 30 mg by mouth every evening.     [provider]  lidocaine-prilocaine (EMLA) cream Apply 1 application topically daily as needed (prior to port being accessed).     [provider]  nitroGLYCERIN (NITROSTAT) 0.4 MG SL tablet Place 0.4 mg under the tongue every 5 (five) minutes as needed for chest pain.    [provider]  NOVOLOG MIX 70/30 FLEXPEN (70-30) 100 UNIT/ML FlexPen Inject 55 Units into the skin daily at 6 PM. 1900 01/17/18   [provider]  oxyCODONE-acetaminophen (PERCOCET) 5-325 MG tablet Take 1 tablet by mouth every 4 (four) hours as needed for severe pain. 03/18/19 03/17/20  Angelia Mould, MD  polyethylene glycol [facility] Floria Raveling) packet Take 17 g by mouth daily as needed for mild constipation.     [provider]  sevelamer carbonate (RENVELA) 800 MG tablet Take 1,600 mg by mouth 3 (three) times daily with meals.    [provider]  tiZANidine (ZANAFLEX) 4 MG tablet Take 4 mg by mouth at bedtime as needed for muscle spasms. 08/22/17   [provider]  triamcinolone cream (KENALOG) 0.5 % Apply 1 application  topically 2 (two) times daily as needed (rash).    [provider]    Allergies    Olmesartan  Review of Systems   Review of Systems  All other systems reviewed and are negative.   Physical Exam Updated Vital Signs BP 107/76   Pulse (!) 135   Temp 98.9 F (37.2 C) (Oral)   Resp 20   Ht 1.676 m (5\' 6" )   Wt 124.3 kg   SpO2 97%   BMI 44.22 kg/m   Physical Exam Vitals and nursing note reviewed.  Constitutional:      General: She is not in acute distress.    Appearance: She is well-developed.  HENT:     Head: Normocephalic and atraumatic.     Mouth/Throat:     Mouth: Mucous membranes are moist.     Pharynx: No oropharyngeal exudate.     Comments: Pale mucous membranes Eyes:     General: No scleral icterus.       Right [name]: No discharge.        Left [name]: No discharge.     Conjunctiva/sclera: Conjunctivae normal.     Pupils: Pupils are equal, round, and reactive to light.  Neck:     Thyroid: No thyromegaly.     Vascular: No JVD.  Cardiovascular:     Rate and Rhythm: Tachycardia present. Rhythm irregular.     Heart sounds: Normal heart sounds. No murmur. No friction rub. No gallop.      Comments: Atrial fibrillation with rapid ventricular rate clinically.  Fistula present in the right arm, normal thrill, no surrounding erythema induration or warmth Pulmonary:     Effort: Pulmonary effort is normal. No respiratory distress.     Breath sounds: Normal breath sounds. No wheezing or rales.     Comments: Prior mastectomy, well-healed surgical scar to the left chest wall chaperone present for exam Chest:     Chest wall: No tenderness.  Abdominal:     General: Bowel sounds are normal. There is no distension.     Palpations: Abdomen is soft. There is no mass.     Tenderness: There is no abdominal tenderness.  Musculoskeletal:        General: No tenderness. Normal range of motion.     Cervical back: Normal range of motion and neck supple.  Lymphadenopathy:      Cervical: No cervical adenopathy.  Skin:    General: Skin is warm and dry.     Findings: No erythema or rash.  Neurological:     General: No focal deficit present.     Mental Status: She is alert.     Coordination: Coordination normal.     Comments: The patient is able to move all 4 extremities with normal mental status and coordination, no facial droop  Psychiatric:        Behavior: Behavior normal.     ED Results / Procedures / Treatments   Labs (all labs ordered are listed, but only abnormal results are displayed) Labs Reviewed  BASIC METABOLIC PANEL - Abnormal; Notable for the following components:      Result Value   Potassium 3.0 (*)    Chloride 97 (*)    Creatinine, Ser 4.35 (*)    Calcium 8.6 (*)    GFR calc non Af Amer 9 (*)    GFR calc Af Amer 11 (*)    All other components within normal limits  CBC - Abnormal; Notable for the following components:   Hemoglobin 11.8 (*)    MCH 25.6 (*)    Platelets 148 (*)    All other components within normal limits  MAGNESIUM  PROTIME-INR  APTT  TSH  POC SARS CORONAVIRUS 2 AG -  ED    EKG EKG Interpretation  Date/Time:  Thursday May 09 2019 10:45:48 EST Ventricular Rate:  104 PR Interval:    QRS Duration: 170 QT Interval:  422 QTC Calculation: 556 R Axis:   -45 Text Interpretation: Atrial fibrillation Paired ventricular premature complexes Left bundle branch block Since last tracing rate faster afib now likely preasent Confirmed by Noemi Chapel [phone]) on 05/09/2019 10:53:03 AM   Radiology DG Chest Port 1 View  Result Date: 05/09/2019 CLINICAL DATA:  Acute onset of atrial fibrillation after hemodialysis earlier today. EXAM: PORTABLE CHEST 1 VIEW COMPARISON:  09/07/2018 and earlier. FINDINGS: Cardiac silhouette moderately to markedly enlarged, unchanged. Pulmonary vascularity normal. Lungs clear. No pleural effusions. Stable chronic mild elevation of the RIGHT hemidiaphragm. IMPRESSION: 1. Stable moderate to  marked cardiomegaly. No acute cardiopulmonary disease. 2. Stable chronic elevation of the RIGHT hemidiaphragm. Electronically Signed   By: Evangeline Dakin M.D.   On: 05/09/2019 11:23    Procedures .Critical Care Performed by: Noemi Chapel, MD Authorized by: Noemi Chapel, MD   Critical care provider statement:    Critical care time (minutes):  35   Critical care time was exclusive of:  Separately billable procedures and treating other patients and teaching time   Critical care was necessary to treat or prevent imminent or life-threatening deterioration of the following conditions:  Cardiac failure   Critical care was time spent personally by me on the following activities:  Blood draw for specimens, development of treatment plan with patient or surrogate, discussions with consultants, evaluation of patient's response to treatment, examination of patient, obtaining history from patient or surrogate, ordering and performing treatments and interventions, ordering and review of laboratory studies, ordering and review of radiographic studies, pulse oximetry, re-evaluation of patient's condition and review of old charts   (including critical care time)  Medications Ordered in ED Medications  diltiazem (CARDIZEM) 1 mg/mL load via infusion 10 mg (has no administration in time range)    And  diltiazem (CARDIZEM) 125 mg in dextrose 5% 125 mL (1 mg/mL) infusion (has no administration in time range)  metoprolol tartrate (LOPRESSOR) tablet 50 mg (50 mg Oral Given 05/09/19 1148)    ED Course  I have reviewed the triage vital signs and the nursing notes.  Pertinent labs & imaging results that were available during my care of the patient were reviewed by me and considered in my medical decision making (see chart for details).    MDM Rules/Calculators/A&P                       The patient has what appears to be new onset atrial fibrillation, she appears hemodynamically and clinically stable but  will need evaluation for the same, she does appear pale in the mucous membranes, check for anemia, patient agreeable to the plan.  Cardiac monitoring, EKG, labs.  EKG shows left bundle branch block similar to old EKG however there does appear to be some ectopy today and it is difficult to see if there is any P waves.  There is irregularity which could be consistent with atrial fibrillation.  We will give a dose of beta-blocker, recheck EKG, the patient otherwise appears clinically stable, blood pressure 132/65.  She is afebrile  The HR continues to be elevated at 130.  There is no clear onset as the patient doesn't feel palpitations.  She is not anticoagulated - cardizem  drip has been started - the pt will need to be admitted for rate control and likely anticoagulation.  D.w Dr. Manuella Ghazi who has been kind enough to admit to the hospital.  Final Clinical Impression(s) / ED Diagnoses Final diagnoses:  Atrial fibrillation with rapid ventricular response ([place])  Hypokalemia    Rx / DC Orders ED Discharge Orders         Ordered    Amb referral to AFIB Clinic     05/09/19 1050           Noemi Chapel, MD 05/09/19 1221

## 2019-05-09 NOTE — ED Notes (Signed)
Called and spoke with pt's daughter and told her pt was wanting to leave and was very irate; daughter asking to speak with pt; phone transferred.  Thayer Headings took pt upstairs

## 2019-05-09 NOTE — ED Notes (Signed)
Called pt's daughter so she can speak with her; echocardiogram complete

## 2019-05-09 NOTE — Progress Notes (Signed)
Since admit to unit, patient having soft B/P's (97/34, 88/44) with maps in the 50's and heart rate is in the 60's on the cardizem drip at 5mg . Cardizem drip stopped & Dr. Manuella Ghazi made aware. Patient alert and oriented with no complaints.

## 2019-05-10 LAB — CBC
HCT: 38.3 % (ref 36.0–46.0)
Hemoglobin: 12.3 g/dL (ref 12.0–15.0)
MCH: 26.1 pg (ref 26.0–34.0)
MCHC: 32.1 g/dL (ref 30.0–36.0)
MCV: 81.3 fL (ref 80.0–100.0)
Platelets: 160 10*3/uL (ref 150–400)
RBC: 4.71 MIL/uL (ref 3.87–5.11)
RDW: 15.6 % — ABNORMAL HIGH (ref 11.5–15.5)
WBC: 8 10*3/uL (ref 4.0–10.5)
nRBC: 0 % (ref 0.0–0.2)

## 2019-05-10 LAB — GLUCOSE, CAPILLARY
Glucose-Capillary: 135 mg/dL — ABNORMAL HIGH (ref 70–99)
Glucose-Capillary: 140 mg/dL — ABNORMAL HIGH (ref 70–99)
Glucose-Capillary: 123 mg/dL — ABNORMAL HIGH (ref 70–99)
Glucose-Capillary: 123 mg/dL — ABNORMAL HIGH (ref 70–99)

## 2019-05-10 LAB — MAGNESIUM: Magnesium: 1.8 mg/dL (ref 1.7–2.4)

## 2019-05-10 LAB — BASIC METABOLIC PANEL
Anion gap: 14 (ref 5–15)
BUN: 25 mg/dL — ABNORMAL HIGH (ref 8–23)
CO2: 25 mmol/L (ref 22–32)
Calcium: 8.8 mg/dL — ABNORMAL LOW (ref 8.9–10.3)
Chloride: 97 mmol/L — ABNORMAL LOW (ref 98–111)
Creatinine, Ser: 6.18 mg/dL — ABNORMAL HIGH (ref 0.44–1.00)
GFR calc Af Amer: 7 mL/min — ABNORMAL LOW (ref >60)
GFR calc non Af Amer: 6 mL/min — ABNORMAL LOW (ref >60)
Glucose, Bld: 143 mg/dL — ABNORMAL HIGH (ref 70–99)
Potassium: 3.8 mmol/L (ref 3.5–5.1)
Sodium: 136 mmol/L (ref 135–145)

## 2019-05-10 MED ORDER — CHLORHEXIDINE GLUCONATE CLOTH 2 % EX PADS
6.0000 | MEDICATED_PAD | Freq: Every day | CUTANEOUS | Status: DC
  Administered 2019-05-10 – 2019-05-13 (×4): 6 via TOPICAL

## 2019-05-10 MED ORDER — METOPROLOL TARTRATE 25 MG PO TABS
25.0000 mg | ORAL_TABLET | Freq: Two times a day (BID) | ORAL | Status: DC
  Administered 2019-05-10: 21:00:00 25 mg via ORAL
  Filled 2019-05-10 (×4): qty 1

## 2019-05-10 MED ORDER — ALBUTEROL SULFATE (2.5 MG/3ML) 0.083% IN NEBU: 2.5000 mg | INHALATION_SOLUTION | Freq: Four times a day (QID) | RESPIRATORY_TRACT | Status: DC | PRN

## 2019-05-10 NOTE — TOC Progression Note (Signed)
Informed by MD that pt may dc home today or tomorrow on Eliquis. Gave 30 day Eliquis coupon to pt's RN, Lilia Pro, with request to give to pt.

## 2019-05-10 NOTE — Progress Notes (Signed)
PROGRESS NOTE    Alexis [name]  WNU:[id number] DOB: 06-28-1945 DOA: 05/09/2019 PCP: System, Provider Not In   Brief Narrative:  Per HPI: Alexis [name] is a 73 y.o. female with medical history significant for obesity, ESRD on HD TTS, dyslipidemia, hypertension, type 2 diabetes, and CHF with LVEF 40-45% with grade 2 diastolic dysfunction who was brought to the ED today from her dialysis facility after she was noted to be tachycardic.  She was able to complete her entire 4-hour dialysis session without any difficulties and she was otherwise asymptomatic.  She denies any shortness of breath, chest pain, fever, chills, abdominal pain, nausea, or vomiting.  She takes her usual home medications as prescribed with no other difficulties.  She has no prior knowledge of having atrial fibrillation.  12/25: Patient was admitted with asymptomatic, new onset atrial fibrillation with RVR and required temporary use of Cardizem infusion.  This began during her hemodialysis session on 12/24.  She continues without any further complaints and remains off Cardizem infusion.  She is still in atrial fibrillation with heart rates in the low 100s.  She has some softer blood pressure readings today.  2D echocardiogram performed, but with results pending.  TSH within normal limits.  We will plan to hold hydralazine and Imdur today and maintain on metoprolol 25 mg twice daily and monitor heart rate control.  Assessment & Plan:   Active Problems:   Atrial fibrillation with RVR (HCC)   New onset atrial fibrillation with RVR-asymptomatic -Continue metoprolol 25 mg twice daily as prescribed and hold infusion -Hold Imdur as well as hydralazine given borderline hypotension -Monitor closely on telemetry -TSH 1.267 -2D echocardiogram performed and results currently pending.  Prior 2D echocardiogram 07/2017 -Outpatient cardiology follow-up -CHA2DS2-VASc score of 3; keep on Eliquis  ESRD on HD TTS -Patient completed  hemodialysis today and will require further hemodialysis on 12/26 -We will plan to notify nephrology in a.m. for dialysis  Mild hypokalemia-resolved -Recheck a.m. BMP  History of combined CHF -No signs of active decompensation currently noted and will recheck 2D echocardiogram  Type 2 diabetes -Currently without hyperglycemia -Maintain on NovoLog 70/30 as well as SSI  Dyslipidemia -Maintain on statin  Obesity -Lifestyle modification  Hypertension -Currently stable, continue home meds   DVT prophylaxis: Eliquis Code Status: Full Family Communication: None at bedside Disposition Plan: Continue ongoing monitoring and treatments.  PT evaluation prior to discharge.  2D echocardiogram pending.  Likely need for hemodialysis in a.m. with nephrology to be consulted.   Consultants:   None  Procedures:   None  Antimicrobials:   None   Subjective: Patient seen and evaluated today with no new acute complaints or concerns. No acute concerns or events noted overnight.  She continues to have heart rates in the low 100 range as well as some hypotension.  Objective: Vitals:   05/10/19 1105 05/10/19 1124 05/10/19 1138 05/10/19 1314  BP: (!) 81/42   (!) 90/22  Pulse: (!) 116     Resp:      Temp:   98.1 F (36.7 C)   TempSrc:   Oral   SpO2: 99% 96%  96%  Weight:      Height:        Intake/Output Summary (Last 24 hours) at 05/10/2019 1503 Last data filed at 05/09/2019 1809 Gross per 24 hour  Intake 21.83 ml  Output --  Net 21.83 ml   Filed Weights   05/09/19 1041 05/09/19 1818  Weight: 124.3 kg 99.3 kg  Examination:  General exam: Appears calm and comfortable  Respiratory system: Clear to auscultation. Respiratory effort normal.  Currently on room air. Cardiovascular system: S1 & S2 heard, irregular and tachycardic. No JVD, murmurs, rubs, gallops or clicks. No pedal edema. Gastrointestinal system: Abdomen is nondistended, soft and nontender. No  organomegaly or masses felt. Normal bowel sounds heard. Central nervous system: Alert and oriented. No focal neurological deficits. Extremities: Symmetric 5 x 5 power. Skin: No rashes, lesions or ulcers Psychiatry: Judgement and insight appear normal. Mood & affect appropriate.     Data Reviewed: I have personally reviewed following labs and imaging studies  CBC: Recent Labs  Lab 05/09/19 1134 05/10/19 0520  WBC 8.5 8.0  HGB 11.8* 12.3  HCT 37.2 38.3  MCV 80.7 81.3  PLT 148* 160   Basic Metabolic Panel: Recent Labs  Lab 05/09/19 1134 05/10/19 0520  NA 136 136  K 3.0* 3.8  CL 97* 97*  CO2 26 25  GLUCOSE 83 143*  BUN 16 25*  CREATININE 4.35* 6.18*  CALCIUM 8.6* 8.8*  MG 1.8 1.8   GFR: Estimated Creatinine Clearance: 9.6 mL/min (A) (by C-G formula based on SCr of 6.18 mg/dL (H)). Liver Function Tests: No results for input(s): AST, ALT, ALKPHOS, BILITOT, PROT, ALBUMIN in the last 168 hours. No results for input(s): LIPASE, AMYLASE in the last 168 hours. No results for input(s): AMMONIA in the last 168 hours. Coagulation Profile: Recent Labs  Lab 05/09/19 1134  INR 1.0   Cardiac Enzymes: No results for input(s): CKTOTAL, CKMB, CKMBINDEX, TROPONINI in the last 168 hours. BNP (last 3 results) No results for input(s): PROBNP in the last 8760 hours. HbA1C: Recent Labs    05/09/19 1134  HGBA1C 6.4*   CBG: Recent Labs  Lab 05/09/19 1644 05/09/19 2132 05/10/19 0744 05/10/19 1127  GLUCAP 186* 141* 135* 140*   Lipid Profile: No results for input(s): CHOL, HDL, LDLCALC, TRIG, CHOLHDL, LDLDIRECT in the last 72 hours. Thyroid Function Tests: Recent Labs    05/09/19 1134  TSH 1.267   Anemia Panel: No results for input(s): VITAMINB12, FOLATE, FERRITIN, TIBC, IRON, RETICCTPCT in the last 72 hours. Sepsis Labs: No results for input(s): PROCALCITON, LATICACIDVEN in the last 168 hours.  Recent Results (from the past 240 hour(s))  SARS Coronavirus 2 by RT PCR  (hospital order, performed in [facility] hospital lab)     Status: None   Collection Time: 05/09/19  6:04 PM  Result Value Ref Range Status   SARS Coronavirus 2 NEGATIVE NEGATIVE Final    Comment: (NOTE) SARS-CoV-2 target nucleic acids are NOT DETECTED. The SARS-CoV-2 RNA is generally detectable in upper and lower respiratory specimens during the acute phase of infection. The lowest concentration of SARS-CoV-2 viral copies this assay can detect is 250 copies / mL. A negative result does not preclude SARS-CoV-2 infection and should not be used as the sole basis for treatment or other patient management decisions.  A negative result may occur with improper specimen collection / handling, submission of specimen other than nasopharyngeal swab, presence of viral mutation(s) within the areas targeted by this assay, and inadequate number of viral copies (<250 copies / mL). A negative result must be combined with clinical observations, patient history, and epidemiological information. Fact Sheet for Patients:   BoilerBrush.com.cy Fact Sheet for Healthcare Providers: https://pope.com/ This test is not yet approved or cleared  by the [place] FDA and has been authorized for detection and/or diagnosis of SARS-CoV-2 by FDA under an Emergency Use  Authorization (EUA).  This EUA will remain in effect (meaning this test can be used) for the duration of the COVID-19 declaration under Section 564(b)(1) of the Act, 21 U.S.C. section 360bbb-3(b)(1), unless the authorization is terminated or revoked sooner. Performed at [facility], [address], [place], [place] [address]          Radiology Studies: [facility] 1 View  Result Date: 05/09/2019 CLINICAL DATA:  Acute onset of atrial fibrillation after hemodialysis earlier today. EXAM: PORTABLE CHEST 1 VIEW COMPARISON:  09/07/2018 and earlier. FINDINGS: Cardiac silhouette moderately to markedly  enlarged, unchanged. Pulmonary vascularity normal. Lungs clear. No pleural effusions. Stable chronic mild elevation of the RIGHT hemidiaphragm. IMPRESSION: 1. Stable moderate to marked cardiomegaly. No acute cardiopulmonary disease. 2. Stable chronic elevation of the RIGHT hemidiaphragm. Electronically Signed   By: Hulan Saas M.D.   On: 05/09/2019 11:23        Scheduled Meds: . amitriptyline  25 mg Oral QHS  . apixaban  5 mg Oral BID  . aspirin EC  81 mg Oral Daily  . atorvastatin  80 mg Oral Daily  . calcium-vitamin D  1 tablet Oral Daily  . Chlorhexidine Gluconate Cloth  6 each Topical Daily  . docusate sodium  100 mg Oral QODAY  . ferric citrate  420 mg Oral TID WC  . insulin aspart  0-5 Units Subcutaneous QHS  . insulin aspart  0-6 Units Subcutaneous TID WC  . metoprolol tartrate  25 mg Oral BID  . sevelamer carbonate  1,600 mg Oral TID WC  . sodium chloride flush  3 mL Intravenous Q12H   Continuous Infusions: . sodium chloride       LOS: 1 day    Time spent: 30 minutes    Khole Arterburn Hoover Brunette, DO [facility] Pager [phone]  If 7PM-7AM, please contact night-coverage [email or web address] Password [facility] 05/10/2019, 3:03 PM

## 2019-05-11 LAB — CBC
HCT: 37.9 % (ref 36.0–46.0)
Hemoglobin: 12.1 g/dL (ref 12.0–15.0)
MCH: 26.1 pg (ref 26.0–34.0)
MCHC: 31.9 g/dL (ref 30.0–36.0)
MCV: 81.9 fL (ref 80.0–100.0)
Platelets: 187 10*3/uL (ref 150–400)
RBC: 4.63 MIL/uL (ref 3.87–5.11)
RDW: 15.8 % — ABNORMAL HIGH (ref 11.5–15.5)
WBC: 8.8 10*3/uL (ref 4.0–10.5)
nRBC: 0 % (ref 0.0–0.2)

## 2019-05-11 LAB — BASIC METABOLIC PANEL
Anion gap: 14 (ref 5–15)
BUN: 34 mg/dL — ABNORMAL HIGH (ref 8–23)
CO2: 25 mmol/L (ref 22–32)
Calcium: 8.9 mg/dL (ref 8.9–10.3)
Chloride: 95 mmol/L — ABNORMAL LOW (ref 98–111)
Creatinine, Ser: 7.95 mg/dL — ABNORMAL HIGH (ref 0.44–1.00)
GFR calc Af Amer: 5 mL/min — ABNORMAL LOW (ref >60)
GFR calc non Af Amer: 5 mL/min — ABNORMAL LOW (ref >60)
Glucose, Bld: 132 mg/dL — ABNORMAL HIGH (ref 70–99)
Potassium: 3.9 mmol/L (ref 3.5–5.1)
Sodium: 134 mmol/L — ABNORMAL LOW (ref 135–145)

## 2019-05-11 LAB — GLUCOSE, CAPILLARY
Glucose-Capillary: 120 mg/dL — ABNORMAL HIGH (ref 70–99)
Glucose-Capillary: 127 mg/dL — ABNORMAL HIGH (ref 70–99)
Glucose-Capillary: 95 mg/dL (ref 70–99)
Glucose-Capillary: 136 mg/dL — ABNORMAL HIGH (ref 70–99)

## 2019-05-11 LAB — MAGNESIUM: Magnesium: 2 mg/dL (ref 1.7–2.4)

## 2019-05-11 MED ORDER — PENTAFLUOROPROP-TETRAFLUOROETH EX AERO
1.0000 "application " | INHALATION_SPRAY | CUTANEOUS | Status: DC | PRN
  Filled 2019-05-11: qty 116

## 2019-05-11 MED ORDER — LIDOCAINE HCL (PF) 1 % IJ SOLN: 5.0000 mL | INTRAMUSCULAR | Status: DC | PRN

## 2019-05-11 MED ORDER — LIDOCAINE-PRILOCAINE 2.5-2.5 % EX CREA: 1.0000 "application " | TOPICAL_CREAM | CUTANEOUS | Status: DC | PRN

## 2019-05-11 MED ORDER — CHLORHEXIDINE GLUCONATE CLOTH 2 % EX PADS
6.0000 | MEDICATED_PAD | Freq: Every day | CUTANEOUS | Status: DC
  Administered 2019-05-12 – 2019-05-13 (×2): 6 via TOPICAL

## 2019-05-11 MED ORDER — SODIUM CHLORIDE 0.9 % IV SOLN: 100.0000 mL | INTRAVENOUS | Status: DC | PRN

## 2019-05-11 MED ORDER — HEPARIN SODIUM (PORCINE) 1000 UNIT/ML DIALYSIS
20.0000 [IU]/kg | INTRAMUSCULAR | Status: DC | PRN
  Filled 2019-05-11: qty 2

## 2019-05-11 NOTE — Procedures (Signed)
    HEMODIALYSIS TREATMENT NOTE:  3.5 hour low-heparin dialysis completed via right forearm loop AVG (retrograde flow confirmed). Goal met: 1 liter removed.  All blood was returned and hemostasis was achieved in 15 minutes.   Rockwell Alexandria, RN

## 2019-05-11 NOTE — Progress Notes (Signed)
This RN and another RN attempted to get pt up with a walker to ambulate around unit for HR and pulse ox reading on RA. Pt needed assistance to get out of bed and to stand with walker. While standing pt c/o dizziness and weakness. At highest HR was 118 and O2 sat was 90% on RA at the lowest. Unable to ambulate pt d/t weakness and pt stating she could not. Reports she wants to go home today and not stay another day. Explained to pt how importance of ambulation and HR control as well as maintaining oxygen levels. Pt state she "doesn't care, I don't want to stay another day. The doctor said I was going home and I want to go home". Asked pt if she would be willing to go to a rehab facility and she states "as long as it isn't here". MD Manuella Ghazi notified.

## 2019-05-11 NOTE — Progress Notes (Signed)
PROGRESS NOTE    Alexis [name]  HYQ:[id number] DOB: 1945/10/07 DOA: 05/09/2019 PCP: System, Provider Not In   Brief Narrative:  Per HPI: Molli Posey a 73 y.o.femalewith medical history significant forobesity, ESRD on HD TTS, dyslipidemia, hypertension, type 2 diabetes, and CHF with LVEF 40-45% with grade 2 diastolic dysfunction who was brought to the ED today from her dialysis facility after she was noted to be tachycardic. She was able to complete her entire 4-hour dialysis session without any difficulties and she was otherwise asymptomatic. She denies any shortness of breath, chest pain, fever, chills, abdominal pain, nausea, or vomiting. She takes her usual home medications as prescribed with no other difficulties. She has no prior knowledge of having atrial fibrillation.  12/25: Patient was admitted with asymptomatic, new onset atrial fibrillation with RVR and required temporary use of Cardizem infusion.  This began during her hemodialysis session on 12/24.  She continues without any further complaints and remains off Cardizem infusion.  She is still in atrial fibrillation with heart rates in the low 100s.  She has some softer blood pressure readings today.  2D echocardiogram performed, but with results pending.  TSH within normal limits.  We will plan to hold hydralazine and Imdur today and maintain on metoprolol 25 mg twice daily and monitor heart rate control.  12/26: Patient continues to have atrial fibrillation which is now rate controlled at around 90 to 100 bpm.  She denies any symptoms and is overall just frustrated with being in the hospital and wants to go home as soon as possible.  Unfortunately, her 2D echocardiogram results are still pending.  I have asked nursing staff to try to ambulate her, but she is becoming quite weak and dizzy with standing up.  I have ordered PT evaluation.  Discussed case with nephrology for further hemodialysis while here.  Continue metoprolol  as ordered and hold Coreg as well as Imdur and hydralazine for now and monitor blood pressures.  Assessment & Plan:   Active Problems:   Atrial fibrillation with RVR (HCC)   New onset atrial fibrillation with RVR-asymptomatic; currently rate controlled -Continue metoprolol 25 mg twice daily as prescribed and hold infusion -Hold Imdur as well as hydralazine given borderline hypotension -Monitor closely on telemetry -TSH 1.267 -2D echocardiogram performed and results still pending.  Prior 2D echocardiogram 07/2017 -Outpatient cardiology follow-up -CHA2DS2-VASc score of 3; keep on Eliquis  ESRD on HD TTS -Patient completed hemodialysis today and will require further hemodialysis on 12/26 -Notified nephrology on 12/26 who will see patient in consultation  Mild hypokalemia-resolved -Recheck a.m. BMP  History of combined CHF -No signs of active decompensation currently noted and will recheck 2D echocardiogram  Type 2 diabetes -Currently without hyperglycemia -Maintain on NovoLog 70/30 as well as SSI  Dyslipidemia -Maintain on statin  Obesity -Lifestyle modification  Hypertension, currently with hypotension -Continue metoprolol and keep of hydralazine as well as Imdur and monitor   DVT prophylaxis: Eliquis Code Status: Full Family Communication: None at bedside Disposition Plan: Continue ongoing monitoring and treatments.  PT evaluation prior to discharge.  2D echocardiogram pending.    Nephrology consulted for hemodialysis.   Consultants:   Nephrology  Procedures:   None  Antimicrobials:   None  Subjective: Patient seen and evaluated today and continues to be frustrated with having to remain in the hospital and wants to go home.  Nursing staff had tried to ambulate the patient but she is still having some weakness and dizziness.  Her rates  are better controlled and she denies any chest pain or shortness of breath.  Objective: Vitals:   05/11/19  0405 05/11/19 0500 05/11/19 0600 05/11/19 0859  BP:  121/63 (!) 109/91 (!) 119/50  Pulse:  94 91 94  Resp:  (!) 23 19   Temp: 98.3 F (36.8 C)   97.7 F (36.5 C)  TempSrc: Oral   Axillary  SpO2:  95% 92%   Weight:      Height:        Intake/Output Summary (Last 24 hours) at 05/11/2019 0957 Last data filed at 05/10/2019 1729 Gross per 24 hour  Intake 240 ml  Output --  Net 240 ml   Filed Weights   05/09/19 1041 05/09/19 1818  Weight: 124.3 kg 99.3 kg    Examination:  General exam: Appears calm and comfortable  Respiratory system: Clear to auscultation. Respiratory effort normal.  Currently on room air. Cardiovascular system: S1 & S2 heard, irregular but rate controlled. No JVD, murmurs, rubs, gallops or clicks. No pedal edema. Gastrointestinal system: Abdomen is nondistended, soft and nontender. No organomegaly or masses felt. Normal bowel sounds heard. Central nervous system: Alert and oriented. No focal neurological deficits. Extremities: Symmetric 5 x 5 power. Skin: No rashes, lesions or ulcers Psychiatry: Disgruntled and upset with her care.    Data Reviewed: I have personally reviewed following labs and imaging studies  CBC: Recent Labs  Lab 05/09/19 1134 05/10/19 0520 05/11/19 0403  WBC 8.5 8.0 8.8  HGB 11.8* 12.3 12.1  HCT 37.2 38.3 37.9  MCV 80.7 81.3 81.9  PLT 148* 160 187   Basic Metabolic Panel: Recent Labs  Lab 05/09/19 1134 05/10/19 0520 05/11/19 0403  NA 136 136 134*  K 3.0* 3.8 3.9  CL 97* 97* 95*  CO2 26 25 25   GLUCOSE 83 143* 132*  BUN 16 25* 34*  CREATININE 4.35* 6.18* 7.95*  CALCIUM 8.6* 8.8* 8.9  MG 1.8 1.8 2.0   GFR: Estimated Creatinine Clearance: 7.5 mL/min (A) (by C-G formula based on SCr of 7.95 mg/dL (H)). Liver Function Tests: No results for input(s): AST, ALT, ALKPHOS, BILITOT, PROT, ALBUMIN in the last 168 hours. No results for input(s): LIPASE, AMYLASE in the last 168 hours. No results for input(s): AMMONIA in the  last 168 hours. Coagulation Profile: Recent Labs  Lab 05/09/19 1134  INR 1.0   Cardiac Enzymes: No results for input(s): CKTOTAL, CKMB, CKMBINDEX, TROPONINI in the last 168 hours. BNP (last 3 results) No results for input(s): PROBNP in the last 8760 hours. HbA1C: Recent Labs    05/09/19 1134  HGBA1C 6.4*   CBG: Recent Labs  Lab 05/10/19 0744 05/10/19 1127 05/10/19 1615 05/10/19 2128 05/11/19 0801  GLUCAP 135* 140* 123* 123* 120*   Lipid Profile: No results for input(s): CHOL, HDL, LDLCALC, TRIG, CHOLHDL, LDLDIRECT in the last 72 hours. Thyroid Function Tests: Recent Labs    05/09/19 1134  TSH 1.267   Anemia Panel: No results for input(s): VITAMINB12, FOLATE, FERRITIN, TIBC, IRON, RETICCTPCT in the last 72 hours. Sepsis Labs: No results for input(s): PROCALCITON, LATICACIDVEN in the last 168 hours.  Recent Results (from the past 240 hour(s))  SARS Coronavirus 2 by RT PCR (hospital order, performed in [facility] hospital lab)     Status: None   Collection Time: 05/09/19  6:04 PM  Result Value Ref Range Status   SARS Coronavirus 2 NEGATIVE NEGATIVE Final    Comment: (NOTE) SARS-CoV-2 target nucleic acids are NOT DETECTED. The SARS-CoV-2 RNA  is generally detectable in upper and lower respiratory specimens during the acute phase of infection. The lowest concentration of SARS-CoV-2 viral copies this assay can detect is 250 copies / mL. A negative result does not preclude SARS-CoV-2 infection and should not be used as the sole basis for treatment or other patient management decisions.  A negative result may occur with improper specimen collection / handling, submission of specimen other than nasopharyngeal swab, presence of viral mutation(s) within the areas targeted by this assay, and inadequate number of viral copies (<250 copies / mL). A negative result must be combined with clinical observations, patient history, and epidemiological information. Fact Sheet for  Patients:   BoilerBrush.com.cy Fact Sheet for Healthcare Providers: https://pope.com/ This test is not yet approved or cleared  by the [place] FDA and has been authorized for detection and/or diagnosis of SARS-CoV-2 by FDA under an Emergency Use Authorization (EUA).  This EUA will remain in effect (meaning this test can be used) for the duration of the COVID-19 declaration under Section 564(b)(1) of the Act, 21 U.S.C. section 360bbb-3(b)(1), unless the authorization is terminated or revoked sooner. Performed at [facility], [address], [place], [place] [address]          Radiology Studies: [facility] [place] [place] 1 View  Result Date: 05/09/2019 CLINICAL DATA:  Acute onset of atrial fibrillation after hemodialysis earlier today. EXAM: PORTABLE CHEST 1 VIEW COMPARISON:  09/07/2018 and earlier. FINDINGS: Cardiac silhouette moderately to markedly enlarged, unchanged. Pulmonary vascularity normal. Lungs clear. No pleural effusions. Stable chronic mild elevation of the RIGHT hemidiaphragm. IMPRESSION: 1. Stable moderate to marked cardiomegaly. No acute cardiopulmonary disease. 2. Stable chronic elevation of the RIGHT hemidiaphragm. Electronically Signed   By: Hulan Saas M.D.   On: 05/09/2019 11:23        Scheduled Meds: . amitriptyline  25 mg Oral QHS  . apixaban  5 mg Oral BID  . aspirin EC  81 mg Oral Daily  . atorvastatin  80 mg Oral Daily  . calcium-vitamin D  1 tablet Oral Daily  . Chlorhexidine Gluconate Cloth  6 each Topical Daily  . docusate sodium  100 mg Oral QODAY  . ferric citrate  420 mg Oral TID WC  . insulin aspart  0-5 Units Subcutaneous QHS  . insulin aspart  0-6 Units Subcutaneous TID WC  . metoprolol tartrate  25 mg Oral BID  . sevelamer carbonate  1,600 mg Oral TID WC  . sodium chloride flush  3 mL Intravenous Q12H   Continuous Infusions: . sodium chloride       LOS: 2 days    Time spent: 30  minutes    Laurie Penado Hoover Brunette, DO [facility] Pager [phone]  If 7PM-7AM, please contact night-coverage [email or web address] Password [facility] 05/11/2019, 9:57 AM

## 2019-05-11 NOTE — Progress Notes (Signed)
Dialysis at bedside, pt is resting with eyes closed. Will continue to monitor.

## 2019-05-11 NOTE — Consult Note (Signed)
[name] Renal Consultation Note    Indication for Consultation:  Management of ESRD/hemodialysis; anemia, hypertension/volume and secondary hyperparathyroidism  HPI: Alexis [name] is a 73 y.o. female.  Patient has a PMH significant for obesity, DM, HTN, combined diastolic and systolic CHF, and ESRD who presented to [facility] ED after HD due to persistent tachycardia.  In the ED she was noted to have new onset atrial fibrillation with RVR and relative hypotension.  She was admitted on 05/09/19 for rate control with cardizem drip and workup.  We were consulted to provide HD while she remains an inpatient.  Past Medical History:  Diagnosis Date  . Arthritis   . Cancer [facility]) 2005    left breast  . CHF (congestive heart failure) ([place])   . Chronic back pain   . Chronic kidney disease 03/2014   dialysis t/th/sa  . Coronary artery disease   . Diabetes mellitus    Type 2  . Diabetic nephropathy ([place]) 01-08-13  . Dyslipidemia 01-08-13  . ESRD on hemodialysis ([place])    Tu, Th, Sat  . Headache   . Hyperlipidemia   . Hypertension   . LBBB (left bundle branch block)   . Proteinuria 01-08-13  . Pulmonary hypertension ([place])    PA peak pressure 73 mmHg 08/05/17 echo  . Thyroid disease 01-08-13   Hyper-parathyroidism-secondary  . Wears glasses    Past Surgical History:  Procedure Laterality Date  . A/V FISTULAGRAM N/A 08/25/2017   Procedure: A/V FISTULAGRAM - Left Arm;  Surgeon: Angelia Mould, MD;  Location: [place] CV LAB;  Service: Cardiovascular;  Laterality: N/A;  . A/V FISTULAGRAM N/A 02/09/2018   Procedure: A/V [id number];  Surgeon: Elam Dutch, MD;  Location: [place] CV LAB;  Service: Cardiovascular;  Laterality: N/A;  . A/V FISTULAGRAM Left 04/04/2018   Procedure: A/V FISTULAGRAM;  Surgeon: Marty Heck, MD;  Location: [place] CV LAB;  Service: Cardiovascular;  Laterality: Left;  . ABDOMINAL AORTAGRAM N/A 08/11/2014   Procedure: ABDOMINAL  Maxcine Ham;  Surgeon: Angelia Mould, MD;  Location: [facility] CATH LAB;  Service: Cardiovascular;  Laterality: N/A;  . ABDOMINAL HYSTERECTOMY    . APPENDECTOMY    . Arm surgery     Left arm trauma  . AV FISTULA PLACEMENT Left 08/21/2014   Procedure: LEFT ARM ARTERIOVENOUS (AV) FISTULA CREATION ;  Surgeon: Angelia Mould, MD;  Location: [facility] OR;  Service: Vascular;  Laterality: Left;  . AV FISTULA PLACEMENT Right 06/13/2018   Procedure: insertion of right arm ARTERIOVENOUS (AV) gore-tex GRAFT;  Surgeon: Rosetta Posner, MD;  Location: [place];  Service: Vascular;  Laterality: Right;  . BACK SURGERY    . CATARACT EXTRACTION W/PHACO Left 12/22/2014   Procedure: CATARACT EXTRACTION PHACO AND INTRAOCULAR LENS PLACEMENT (IOC);  Surgeon: Tonny Branch, MD;  Location: [facility];  Service: Ophthalmology;  Laterality: Left;  CDE 13.48  . CORONARY ANGIOPLASTY  12/19/2003   inferior wall hypokinesis. ef 50%  . dialysis catheter    . INSERTION OF DIALYSIS CATHETER Right 06/13/2018   Procedure: INSERTION OF DIALYSIS CATHETER, right internal jugular;  Surgeon: Rosetta Posner, MD;  Location: [place];  Service: Vascular;  Laterality: Right;  . IR FLUORO GUIDE CV LINE RIGHT  04/06/2018  . IR REMOVAL TUN CV CATH W/O FL  04/25/2018  . IR REMOVAL TUN CV CATH W/O FL  07/20/2018  . IR US GUIDE VASC ACCESS RIGHT  04/06/2018  . LIGATION OF ARTERIOVENOUS  FISTULA Left 03/18/2019  Procedure: LIGATION OF ARTERIOVENOUS  FISTULA  LEFT ARM;  Surgeon: Angelia Mould, MD;  Location: [place];  Service: Vascular;  Laterality: Left;  Marland Kitchen MASTECTOMY     Left  . PERIPHERAL VASCULAR BALLOON ANGIOPLASTY Left 08/25/2017   Procedure: PERIPHERAL VASCULAR BALLOON ANGIOPLASTY;  Surgeon: Angelia Mould, MD;  Location: [place] CV LAB;  Service: Cardiovascular;  Laterality: Left;  arm fistula  . PERIPHERAL VASCULAR BALLOON ANGIOPLASTY Left 02/09/2018   Procedure: PERIPHERAL VASCULAR BALLOON ANGIOPLASTY;  Surgeon: Elam Dutch, MD;   Location: [place] CV LAB;  Service: Cardiovascular;  Laterality: Left;  AV Fistula  . PERIPHERAL VASCULAR BALLOON ANGIOPLASTY Left 04/04/2018   Procedure: PERIPHERAL VASCULAR BALLOON ANGIOPLASTY;  Surgeon: Marty Heck, MD;  Location: [place] CV LAB;  Service: Cardiovascular;  Laterality: Left;  UPPER ARM FISTULA  . PERIPHERAL VASCULAR CATHETERIZATION N/A 09/16/2015   Procedure: Fistulagram;  Surgeon: Rosetta Posner, MD;  Location: [place] CV LAB;  Service: Cardiovascular;  Laterality: N/A;  . SPINE SURGERY    . TONSILLECTOMY    . TRANSTHORACIC ECHOCARDIOGRAM  10/01/2010    Left ventricle: The cavity size was mildly dilated. Wall thickness was increased in a pattern of mild LVH. Systolic function was   mildly reduced. The estimated ejection fraction was in the range  of 45% to 50%.   . TUBAL LIGATION     Family History:   Family History  Problem Relation Age of Onset  . Breast cancer Mother        Died age 30  . Cancer Mother 67       Breast  . Heart disease Mother   . Hyperlipidemia Mother   . Hypertension Mother   . Heart attack Mother    Social History:  reports that she quit smoking about 21 years ago. Her smoking use included cigarettes. She has a 50.00 pack-year smoking history. She has never used smokeless tobacco. She reports that she does not drink alcohol or use drugs. Allergies  Allergen Reactions  . Olmesartan Other (See Comments)    Hyperkalemia    Prior to Admission medications   Medication Sig Start Date End Date Taking? Authorizing Provider  acetaminophen (TYLENOL) 325 MG tablet Take 650 mg by mouth every 6 (six) hours as needed for moderate pain.   Yes [provider]  amitriptyline (ELAVIL) 25 MG tablet Take 25 mg by mouth at bedtime.   Yes [provider]  aspirin EC 81 MG EC tablet Take 1 tablet (81 mg total) by mouth daily. 08/05/17  Yes Osei-Bonsu, Iona Beard, MD  atorvastatin (LIPITOR) 80 MG tablet Take 80 mg by mouth daily.   Yes  [provider]  AURYXIA 1 GM 210 MG(Fe) tablet Take 420 mg by mouth 3 (three) times daily with meals. Take 2 tablets (420 mg) by mouth 3 times daily with meals 07/25/17  Yes [provider]  calcitRIOL (ROCALTROL) 0.25 MCG capsule Take 0.25 mcg by mouth 3 (three) times a week. On Dialysis days Harrell Lark, Sat. 03/26/14  Yes [provider]  Calcium-Vitamin D-Vitamin K 500-500-40 MG-UNT-MCG CHEW Chew 1 tablet by mouth daily.   Yes [provider]  carvedilol (COREG) 6.25 MG tablet Take 6.25 mg by mouth 2 (two) times daily. 01/17/18  Yes [provider]  diclofenac sodium (VOLTAREN) 1 % GEL Apply 1 application topically 2 (two) times daily as needed (pain.).    Yes [provider]  docusate sodium (COLACE) 100 MG capsule Take 100 mg  by mouth every other day.   Yes [provider]  gabapentin (NEURONTIN) 100 MG capsule Take 200 mg by mouth at bedtime as needed (pain).  01/17/18  Yes [provider]  hydrALAZINE (APRESOLINE) 100 MG tablet Take 100 mg by mouth 3 (three) times daily.   Yes [provider]  isosorbide mononitrate (IMDUR) 30 MG 24 hr tablet Take 30 mg by mouth every evening.    Yes [provider]  lidocaine-prilocaine (EMLA) cream Apply 1 application topically daily as needed (prior to port being accessed).    Yes [provider]  nitroGLYCERIN (NITROSTAT) 0.4 MG SL tablet Place 0.4 mg under the tongue every 5 (five) minutes as needed for chest pain.   Yes [provider]  NOVOLOG MIX 70/30 FLEXPEN (70-30) 100 UNIT/ML FlexPen Inject 55 Units into the skin daily at 6 PM. 1900 01/17/18  Yes [provider]  sevelamer carbonate (RENVELA) 800 MG tablet Take 1,600 mg by mouth 3 (three) times daily with meals.   Yes [provider]  tiZANidine (ZANAFLEX) 4 MG tablet Take 4 mg by mouth at bedtime as needed for muscle spasms. 08/22/17  Yes [provider]  triamcinolone  cream (KENALOG) 0.5 % Apply 1 application topically 2 (two) times daily as needed (rash).   Yes [provider]  albuterol (VENTOLIN HFA) 108 (90 Base) MCG/ACT inhaler Inhale 1-2 puffs into the lungs every 6 (six) hours as needed for wheezing or shortness of breath.    [provider]  HYDROcodone-acetaminophen (NORCO/VICODIN) 5-325 MG tablet Take 2 tablets by mouth every 6 (six) hours as needed for moderate pain or severe pain. Patient not taking: Reported on 05/09/2019 06/13/18   Ulyses Amor, PA-C  oxyCODONE-acetaminophen (PERCOCET) 5-325 MG tablet Take 1 tablet by mouth every 4 (four) hours as needed for severe pain. Patient not taking: Reported on 05/09/2019 03/18/19 03/17/20  Angelia Mould, MD  polyethylene glycol [facility] / Floria Raveling) packet Take 17 g by mouth daily as needed for mild constipation.     [provider]   Current Facility-Administered Medications  Medication Dose Route Frequency Provider Last Rate Last Admin  . 0.9 %  sodium chloride infusion  250 mL Intravenous PRN Manuella Ghazi, Pratik D, DO      . acetaminophen (TYLENOL) tablet 650 mg  650 mg Oral Q6H PRN Manuella Ghazi, Pratik D, DO       Or  . acetaminophen (TYLENOL) suppository 650 mg  650 mg Rectal Q6H PRN Manuella Ghazi, Pratik D, DO      . acetaminophen (TYLENOL) tablet 650 mg  650 mg Oral Q6H PRN Manuella Ghazi, Pratik D, DO      . albuterol (PROVENTIL) (2.5 MG/3ML) 0.083% nebulizer solution 2.5 mg  2.5 mg Nebulization Q6H PRN Manuella Ghazi, Pratik D, DO      . amitriptyline (ELAVIL) tablet 25 mg  25 mg Oral QHS Shah, Pratik D, DO   25 mg at 05/10/19 2129  . apixaban (ELIQUIS) tablet 5 mg  5 mg Oral BID Manuella Ghazi, Pratik D, DO   5 mg at 05/11/19 0900  . aspirin EC tablet 81 mg  81 mg Oral Daily Manuella Ghazi, Pratik D, DO   81 mg at 05/11/19 0900  . atorvastatin (LIPITOR) tablet 80 mg  80 mg Oral Daily Manuella Ghazi, Pratik D, DO   80 mg at 05/11/19 0900  . calcium-vitamin D (OSCAL WITH D) 500-200 MG-UNIT per tablet 1 tablet  1 tablet Oral Daily  Manuella Ghazi, Pratik D, DO   1 tablet  at 05/11/19 0900  . Chlorhexidine Gluconate Cloth 2 % PADS 6 each  6 each Topical Daily Heath Lark D, DO   6 each at 05/11/19 0901  . Chlorhexidine Gluconate Cloth 2 % PADS 6 each  6 each Topical Q0600 Donato Heinz, MD      . docusate sodium (COLACE) capsule 100 mg  100 mg Oral QODAY Shah, Pratik D, DO   100 mg at 05/11/19 0900  . ferric citrate (AURYXIA) tablet 420 mg  420 mg Oral TID WC Shah, Pratik D, DO   420 mg at 05/11/19 0859  . gabapentin (NEURONTIN) capsule 200 mg  200 mg Oral QHS PRN Manuella Ghazi, Pratik D, DO      . insulin aspart (novoLOG) injection 0-5 Units  0-5 Units Subcutaneous QHS Manuella Ghazi, Pratik D, DO      . insulin aspart (novoLOG) injection 0-6 Units  0-6 Units Subcutaneous TID WC Shah, Pratik D, DO      . metoprolol tartrate (LOPRESSOR) tablet 25 mg  25 mg Oral BID Manuella Ghazi, Pratik D, DO   25 mg at 05/10/19 2129  . nitroGLYCERIN (NITROSTAT) SL tablet 0.4 mg  0.4 mg Sublingual Q5 min PRN Manuella Ghazi, Pratik D, DO      . ondansetron (ZOFRAN) tablet 4 mg  4 mg Oral Q6H PRN Manuella Ghazi, Pratik D, DO       Or  . ondansetron (ZOFRAN) injection 4 mg  4 mg Intravenous Q6H PRN Manuella Ghazi, Pratik D, DO      . polyethylene glycol (MIRALAX / GLYCOLAX) packet 17 g  17 g Oral Daily PRN Manuella Ghazi, Pratik D, DO      . sevelamer carbonate (RENVELA) tablet 1,600 mg  1,600 mg Oral TID WC Shah, Pratik D, DO   1,600 mg at 05/11/19 0859  . sodium chloride flush (NS) 0.9 % injection 3 mL  3 mL Intravenous Q12H Shah, Pratik D, DO   3 mL at 05/11/19 0856  . sodium chloride flush (NS) 0.9 % injection 3 mL  3 mL Intravenous PRN Manuella Ghazi, Pratik D, DO      . tiZANidine (ZANAFLEX) tablet 4 mg  4 mg Oral QHS PRN Heath Lark D, DO       Labs: Basic Metabolic Panel: Recent Labs  Lab 05/09/19 1134 05/10/19 0520 05/11/19 0403  NA 136 136 134*  K 3.0* 3.8 3.9  CL 97* 97* 95*  CO2 26 25 25   GLUCOSE 83 143* 132*  BUN 16 25* 34*  CREATININE 4.35* 6.18* 7.95*  CALCIUM 8.6* 8.8* 8.9   Liver Function  Tests: No results for input(s): AST, ALT, ALKPHOS, BILITOT, PROT, ALBUMIN in the last 168 hours. No results for input(s): LIPASE, AMYLASE in the last 168 hours. No results for input(s): AMMONIA in the last 168 hours. CBC: Recent Labs  Lab 05/09/19 1134 05/10/19 0520 05/11/19 0403  WBC 8.5 8.0 8.8  HGB 11.8* 12.3 12.1  HCT 37.2 38.3 37.9  MCV 80.7 81.3 81.9  PLT 148* 160 187   Cardiac Enzymes: No results for input(s): CKTOTAL, CKMB, CKMBINDEX, TROPONINI in the last 168 hours. CBG: Recent Labs  Lab 05/10/19 0744 05/10/19 1127 05/10/19 1615 05/10/19 2128 05/11/19 0801  GLUCAP 135* 140* 123* 123* 120*   Iron Studies: No results for input(s): IRON, TIBC, TRANSFERRIN, FERRITIN in the last 72 hours. Studies/Results: DG Chest Port 1 View  Result Date: 05/09/2019 CLINICAL DATA:  Acute onset of atrial fibrillation after hemodialysis earlier today. EXAM: PORTABLE CHEST 1 VIEW COMPARISON:  09/07/2018 and earlier. FINDINGS:  Cardiac silhouette moderately to markedly enlarged, unchanged. Pulmonary vascularity normal. Lungs clear. No pleural effusions. Stable chronic mild elevation of the RIGHT hemidiaphragm. IMPRESSION: 1. Stable moderate to marked cardiomegaly. No acute cardiopulmonary disease. 2. Stable chronic elevation of the RIGHT hemidiaphragm. Electronically Signed   By: Evangeline Dakin M.D.   On: 05/09/2019 11:23    ROS: A comprehensive review of systems was negative except for: Constitutional: positive for fatigue Physical Exam: Vitals:   05/11/19 0600 05/11/19 0859 05/11/19 0900 05/11/19 1000  BP: (!) 109/91 (!) 119/50 (!) 119/49 (!) 114/58  Pulse: 91 94 (!) 117 (!) 117  Resp: 19  14 (!) 25  Temp:  97.7 F (36.5 C)    TempSrc:  Axillary    SpO2: 92%  98% 95%  Weight:      Height:          Weight change:   Intake/Output Summary (Last 24 hours) at 05/11/2019 1052 Last data filed at 05/10/2019 1729 Gross per 24 hour  Intake 240 ml  Output --  Net 240 ml   BP (!)  114/58   Pulse (!) 117   Temp 97.7 F (36.5 C) (Axillary)   Resp (!) 25   Ht 5\' 6"  (1.676 m)   Wt 99.3 kg   SpO2 95%   BMI 35.33 kg/m  General appearance: alert, cooperative, no distress and moderately obese Head: Normocephalic, without obvious abnormality, atraumatic Resp: clear to auscultation bilaterally Cardio: tachy at 117 GI: soft, non-tender; bowel sounds normal; no masses,  no organomegaly Extremities: extremities normal, atraumatic, no cyanosis or edema and Right forearm AVG +T/B Dialysis Access:  Dialysis Orders: Center: [place]  on TTS . EDW 98.5kg HD Bath 2K/2.5Ca  Time 4:15 Heparin 2000 units IVP then 1000/hr. Access RAVG BFR 400 DFR 600    Hectoral 4.5 mcg IV/HD Epogen 4000   Units IV/HD  Venofer  50 mg IV q Tuesday  Other sensipar 30 mg qtx  Assessment/Plan: 1.  Atrial fibrillation with RVR- new onset during HD on 05/09/19.  Now on metoprolol, holding hydralazine and imdur.  ECHO pending. 2.  ESRD -  Plan for HD today to keep on outpatient schedule 3.  Hypertension/volume  - BP soft, no edema 4.  Anemia  - cont with ESA and iron 5.  Metabolic bone disease -  Continue with outpatient meds. 6.  Nutrition - renal diet 7. DM- per primary 8. Combined systolic and diastolic CHF- stable 9. Disposition- discharge to home per primary svc.   Donetta Potts, MD [employer] Pager [phone] 05/11/2019, 10:52 AM

## 2019-05-12 ENCOUNTER — Inpatient Hospital Stay (HOSPITAL_COMMUNITY): Payer: Medicare Other

## 2019-05-12 DIAGNOSIS — I208 Other forms of angina pectoris: Secondary | ICD-10-CM

## 2019-05-12 DIAGNOSIS — E785 Hyperlipidemia, unspecified: Secondary | ICD-10-CM

## 2019-05-12 DIAGNOSIS — R0600 Dyspnea, unspecified: Secondary | ICD-10-CM

## 2019-05-12 DIAGNOSIS — I272 Pulmonary hypertension, unspecified: Secondary | ICD-10-CM

## 2019-05-12 DIAGNOSIS — R0789 Other chest pain: Secondary | ICD-10-CM

## 2019-05-12 DIAGNOSIS — N186 End stage renal disease: Secondary | ICD-10-CM

## 2019-05-12 DIAGNOSIS — K219 Gastro-esophageal reflux disease without esophagitis: Secondary | ICD-10-CM | POA: Diagnosis present

## 2019-05-12 DIAGNOSIS — Z992 Dependence on renal dialysis: Secondary | ICD-10-CM

## 2019-05-12 DIAGNOSIS — E876 Hypokalemia: Secondary | ICD-10-CM

## 2019-05-12 DIAGNOSIS — I2510 Atherosclerotic heart disease of native coronary artery without angina pectoris: Secondary | ICD-10-CM

## 2019-05-12 LAB — BASIC METABOLIC PANEL
Anion gap: 15 (ref 5–15)
BUN: 24 mg/dL — ABNORMAL HIGH (ref 8–23)
CO2: 26 mmol/L (ref 22–32)
Calcium: 9.3 mg/dL (ref 8.9–10.3)
Chloride: 93 mmol/L — ABNORMAL LOW (ref 98–111)
Creatinine, Ser: 5.85 mg/dL — ABNORMAL HIGH (ref 0.44–1.00)
GFR calc Af Amer: 8 mL/min — ABNORMAL LOW (ref >60)
GFR calc non Af Amer: 7 mL/min — ABNORMAL LOW (ref >60)
Glucose, Bld: 138 mg/dL — ABNORMAL HIGH (ref 70–99)
Potassium: 3.7 mmol/L (ref 3.5–5.1)
Sodium: 134 mmol/L — ABNORMAL LOW (ref 135–145)

## 2019-05-12 LAB — GLUCOSE, CAPILLARY
Glucose-Capillary: 129 mg/dL — ABNORMAL HIGH (ref 70–99)
Glucose-Capillary: 153 mg/dL — ABNORMAL HIGH (ref 70–99)
Glucose-Capillary: 165 mg/dL — ABNORMAL HIGH (ref 70–99)
Glucose-Capillary: 149 mg/dL — ABNORMAL HIGH (ref 70–99)
Glucose-Capillary: 148 mg/dL — ABNORMAL HIGH (ref 70–99)

## 2019-05-12 LAB — ECHOCARDIOGRAM COMPLETE
Height: 66 in
Weight: 4384 oz

## 2019-05-12 LAB — TROPONIN I (HIGH SENSITIVITY)
Troponin I (High Sensitivity): 71 ng/L — ABNORMAL HIGH (ref <18)
Troponin I (High Sensitivity): 68 ng/L — ABNORMAL HIGH (ref <18)

## 2019-05-12 MED ORDER — METOPROLOL TARTRATE 50 MG PO TABS: 50.0000 mg | ORAL_TABLET | Freq: Two times a day (BID) | ORAL | Status: DC

## 2019-05-12 MED ORDER — METOPROLOL TARTRATE 25 MG PO TABS
25.0000 mg | ORAL_TABLET | Freq: Two times a day (BID) | ORAL | Status: DC
  Administered 2019-05-12 – 2019-05-13 (×4): 25 mg via ORAL
  Filled 2019-05-12 (×4): qty 1

## 2019-05-12 MED ORDER — BISACODYL 10 MG RE SUPP
10.0000 mg | Freq: Once | RECTAL | Status: DC
  Filled 2019-05-12: qty 1

## 2019-05-12 MED ORDER — BISMUTH SUBSALICYLATE 262 MG/15ML PO SUSP
30.0000 mL | Freq: Once | ORAL | Status: AC
  Administered 2019-05-12: 09:00:00 30 mL via ORAL
  Filled 2019-05-12: qty 118

## 2019-05-12 MED ORDER — FAMOTIDINE 20 MG PO TABS
10.0000 mg | ORAL_TABLET | Freq: Once | ORAL | Status: AC
  Administered 2019-05-12: 09:00:00 10 mg via ORAL
  Filled 2019-05-12: qty 1

## 2019-05-12 MED ORDER — PANTOPRAZOLE SODIUM 40 MG PO TBEC
40.0000 mg | DELAYED_RELEASE_TABLET | Freq: Two times a day (BID) | ORAL | Status: DC
  Administered 2019-05-12 – 2019-05-15 (×7): 40 mg via ORAL
  Filled 2019-05-12 (×7): qty 1

## 2019-05-12 MED ORDER — METOPROLOL TARTRATE 5 MG/5ML IV SOLN: 2.5000 mg | Freq: Four times a day (QID) | INTRAVENOUS | Status: DC | PRN

## 2019-05-12 NOTE — Progress Notes (Signed)
PROGRESS NOTE [facility]   Alexis [name]  BZJ:[id number]  DOB: October 22, 1945  DOA: 05/09/2019 PCP: System, Provider Not In   Brief Admission Hx: 73 year old female with end-stage renal disease on HD TTS, hypertension, diabetes mellitus, coronary artery disease, stable angina, HFrEF, grade 2 diastolic dysfunction, dyslipidemia presented to the ED by EMS from her dialysis facility with tachycardia and noted to be in atrial fibrillation with RVR which was a new onset for her.  She was asymptomatic from this.  MDM/Assessment & Plan:   1. Atrial fibrillation with RVR-asymptomatic and initially required temporary Cardizem infusion and then subsequently started on oral metoprolol 25 mg twice a day.  Her home hydralazine and Imdur has been held due to soft blood pressure readings.  2D echocardiogram has been performed with results noted below not a significant change from prior echo.  Patient has been frustrated and wanting to discharge home however we have not been able to get her heart rate fully controlled so that she could go home safely.  They had held her metoprolol yesterday due to concerns about low blood pressure.  We have been able to restart the metoprolol today and will be able to evaluate if they can effectively control her heart rate.  Her TSH has been within normal limits.  We will asked the cardiology team to help with managing heart rate.  She is on apixaban for full anticoagulation dosed by the Pharm.D. 2. End-stage renal disease on HD TTS-patient had hemodialysis 05/11/2019 and tolerated it well.  Nephrology has been following her.  She is hopeful to discharge home before her next treatment session. 3. Chest pain/angina-patient has a history of angina and is complaining of chest pain for several hours since early this morning.  I ordered an EKG that was done with no acute findings.  High-sensitivity troponin with negative delta change.  She was given a sublingual nitroglycerin.  I  think her symptoms are mostly GI related.  I do not suspect ACS.  Her chest x-ray and abdominal x-rays were personally reviewed she does have abdominal gas that could be contributing to her symptoms.  Laxatives ordered. 4. Hypokalemia-this is been repleted. 5. Combined systolic diastolic CHF-her echocardiogram was repeated and EF is 40 to 45% severe hypokinesis of the left ventricular, basal-mid inferior wall, inferoseptal wall and inferolateral wall. 6. Essential hypertension-the patient has been having soft blood pressures which has made titrating the metoprolol more difficult.  We are holding her home Coreg Imdur and hydralazine at this time. 7. Dyslipidemia-she has been resumed on her home statin therapy. 8. GERD-she is having symptoms and I have added Protonix and gave her a one-time dose of Pepcid. 9. Type 2 diabetes mellitus-she has been resumed on her home insulin regimen and supplemental sliding scale coverage.  Continue to monitor CBGs.   DVT prophylaxis: Apixaban Code Status: Full Family Communication: Patient updated at bedside, verbalized understanding I had been unable to reach husband. Disposition Plan: Home in 1 to 2 days  Consultants:    Procedures: Echocardiogram IMPRESSIONS  1. Left ventricular ejection fraction, by visual estimation, is 40 to 45%. The left ventricle has moderately decreased function. There is moderately increased left ventricular hypertrophy.  2. Severe hypokinesis of the left ventricular, basal-mid inferior wall, inferoseptal wall and inferolateral wall.  3. Left ventricular diastolic function could not be evaluated.  4. The left ventricle demonstrates regional wall motion abnormalities.  5. Global right ventricle has moderately reduced systolic function.The right ventricular size is moderately enlarged.  Right vetricular wall thickness was not assessed.  6. Left atrial size was severely dilated.  7. Right atrial size was severely dilated.  8. The  pericardial effusion is posterior to the left ventricle.  9. Trivial pericardial effusion is present. 10. Moderate mitral annular calcification. 11. The mitral valve is degenerative. Mild to moderate mitral valve regurgitation. 12. The tricuspid valve is normal in structure. 13. The aortic valve is tricuspid. Aortic valve regurgitation is not visualized. Mild aortic valve sclerosis without stenosis. 14. The pulmonic valve was not well visualized. Pulmonic valve regurgitation is not visualized. 15. Mildly elevated pulmonary artery systolic pressure. 16. The inferior vena cava is normal in size with greater than 50% respiratory variability, suggesting right atrial pressure of 3 mmHg. 17. No significant change from prior study. 18. Prior images reviewed side by side.  Antimicrobials:  n/a   Subjective: Patient complaining of chest pain since 3 AM.  She also has had epigastric abdominal pain.  Last bowel movement was 2 days ago.  NTG was given and it did not have any effect on the pain symptoms.  She says that she normally takes Pepto-Bismol for the symptoms which has been given.  She refused a suppository that was offered.  Objective: Vitals:   05/12/19 0800 05/12/19 1300 05/12/19 1400 05/12/19 1500  BP: 103/76 (!) 133/97 (!) 97/53 (!) 94/57  Pulse: 93 (!) 49 89 88  Resp: (!) 24 20 (!) 24 (!) 26  Temp:      TempSrc:      SpO2: 99% 100% 96% 100%  Weight:      Height:        Intake/Output Summary (Last 24 hours) at 05/12/2019 1603 Last data filed at 05/11/2019 1800 Gross per 24 hour  Intake 350 ml  Output --  Net 350 ml   Filed Weights   05/09/19 1818 05/11/19 1100 05/12/19 0500  Weight: 99.3 kg 98.7 kg 100 kg     REVIEW OF SYSTEMS  As per history otherwise all reviewed and reported negative  Exam:  General exam: Chronically ill-appearing female sitting up in the bed she is awake and alert and does not appear to be in any distress. Respiratory system: Clear. No  increased work of breathing. Cardiovascular system: Normal S1 & S2 heard. No JVD, murmurs, gallops, clicks or pedal edema. Gastrointestinal system: Abdomen is nondistended, soft and mild epigastric tenderness with deep palpation but no guarding. Normal bowel sounds heard. Central nervous system: Alert and oriented. No focal neurological deficits. Extremities: no CCE.  Data Reviewed: Basic Metabolic Panel: Recent Labs  Lab 05/09/19 1134 05/10/19 0520 05/11/19 0403 05/12/19 0431  NA 136 136 134* 134*  K 3.0* 3.8 3.9 3.7  CL 97* 97* 95* 93*  CO2 26 25 25 26   GLUCOSE 83 143* 132* 138*  BUN 16 25* 34* 24*  CREATININE 4.35* 6.18* 7.95* 5.85*  CALCIUM 8.6* 8.8* 8.9 9.3  MG 1.8 1.8 2.0  --    Liver Function Tests: No results for input(s): AST, ALT, ALKPHOS, BILITOT, PROT, ALBUMIN in the last 168 hours. No results for input(s): LIPASE, AMYLASE in the last 168 hours. No results for input(s): AMMONIA in the last 168 hours. CBC: Recent Labs  Lab 05/09/19 1134 05/10/19 0520 05/11/19 0403  WBC 8.5 8.0 8.8  HGB 11.8* 12.3 12.1  HCT 37.2 38.3 37.9  MCV 80.7 81.3 81.9  PLT 148* 160 187   Cardiac Enzymes: No results for input(s): CKTOTAL, CKMB, CKMBINDEX, TROPONINI in the last 168 hours.  CBG (last 3)  Recent Labs    05/12/19 0724 05/12/19 1143 05/12/19 1354  GLUCAP 129* 153* 165*   Recent Results (from the past 240 hour(s))  SARS Coronavirus 2 by RT PCR (hospital order, performed in [facility] hospital lab)     Status: None   Collection Time: 05/09/19  6:04 PM  Result Value Ref Range Status   SARS Coronavirus 2 NEGATIVE NEGATIVE Final    Comment: (NOTE) SARS-CoV-2 target nucleic acids are NOT DETECTED. The SARS-CoV-2 RNA is generally detectable in upper and lower respiratory specimens during the acute phase of infection. The lowest concentration of SARS-CoV-2 viral copies this assay can detect is 250 copies / mL. A negative result does not preclude SARS-CoV-2 infection and  should not be used as the sole basis for treatment or other patient management decisions.  A negative result may occur with improper specimen collection / handling, submission of specimen other than nasopharyngeal swab, presence of viral mutation(s) within the areas targeted by this assay, and inadequate number of viral copies (<250 copies / mL). A negative result must be combined with clinical observations, patient history, and epidemiological information. Fact Sheet for Patients:   StrictlyIdeas.no Fact Sheet for Healthcare Providers: BankingDealers.co.za This test is not yet approved or cleared  by the [place] FDA and has been authorized for detection and/or diagnosis of SARS-CoV-2 by FDA under an Emergency Use Authorization (EUA).  This EUA will remain in effect (meaning this test can be used) for the duration of the COVID-19 declaration under Section 564(b)(1) of the Act, 21 U.S.C. section 360bbb-3(b)(1), unless the authorization is terminated or revoked sooner. Performed at [facility], [address] [place], [place] [address]      Studies: DG CHEST PORT 1 VIEW  Result Date: 05/12/2019 CLINICAL DATA:  Tachycardia. End-stage renal disease. EXAM: PORTABLE CHEST 1 VIEW COMPARISON:  May 09, 2019 FINDINGS: The mediastinal contour is normal. The heart size is enlarged. The lungs are clear. There is no focal infiltrate, pulmonary edema, or pleural effusion. Soft tissues and osseous structures are stable. IMPRESSION: No active cardiopulmonary disease. Electronically Signed   By: Abelardo Diesel M.D.   On: 05/12/2019 09:06   DG Abd Portable 1V  Result Date: 05/12/2019 CLINICAL DATA:  No indication provided. EXAM: PORTABLE ABDOMEN - 1 VIEW COMPARISON:  December 26, 2018 FINDINGS: The bowel gas pattern is normal. No radio-opaque calculi or other significant radiographic abnormality are seen. IMPRESSION: No acute abdominal findings.  Electronically Signed   By: Abelardo Diesel M.D.   On: 05/12/2019 09:08     Scheduled Meds: . amitriptyline  25 mg Oral QHS  . apixaban  5 mg Oral BID  . aspirin EC  81 mg Oral Daily  . atorvastatin  80 mg Oral Daily  . bisacodyl  10 mg Rectal Once  . calcium-vitamin D  1 tablet Oral Daily  . Chlorhexidine Gluconate Cloth  6 each Topical Daily  . Chlorhexidine Gluconate Cloth  6 each Topical Q0600  . docusate sodium  100 mg Oral QODAY  . ferric citrate  420 mg Oral TID WC  . insulin aspart  0-5 Units Subcutaneous QHS  . insulin aspart  0-6 Units Subcutaneous TID WC  . metoprolol tartrate  25 mg Oral BID  . pantoprazole  40 mg Oral BID  . sevelamer carbonate  1,600 mg Oral TID WC  . sodium chloride flush  3 mL Intravenous Q12H   Continuous Infusions: . sodium chloride    . sodium  chloride    . sodium chloride      Principal Problem:   Atrial fibrillation with RVR (HCC) Active Problems:   CHF (congestive heart failure) (HCC)   CAD (coronary artery disease)   Chest pain   ESRD (end stage renal disease) (HCC)   Dyspnea   ESRD on hemodialysis (HCC)   Hypokalemia   Hyperlipidemia   Pulmonary hypertension (HCC)   GERD (gastroesophageal reflux disease)   Stable angina ([place])   Time spent:   Irwin Brakeman, MD [facility] 05/12/2019, 4:03 PM    LOS: 3 days  How to contact the [facility] Attending or Consulting provider [place] or covering provider during after hours [place], for this patient?  1. Check the care team in [facility] and look for a) attending/consulting [facility] provider listed and b) the [facility] team listed 2. Log into [email or web address] and use [place]'s universal password to access. If you do not have the password, please contact the hospital operator. 3. Locate the [facility] provider you are looking for under [facility] and page to a number that you can be directly reached. 4. If you still have difficulty reaching the provider, please page the [facility] (Director on Call) for  the Hospitalists listed on amion for assistance.

## 2019-05-13 DIAGNOSIS — I5022 Chronic systolic (congestive) heart failure: Secondary | ICD-10-CM

## 2019-05-13 DIAGNOSIS — I4891 Unspecified atrial fibrillation: Principal | ICD-10-CM

## 2019-05-13 LAB — CBC
HCT: 39.2 % (ref 36.0–46.0)
Hemoglobin: 12.3 g/dL (ref 12.0–15.0)
MCH: 25.8 pg — ABNORMAL LOW (ref 26.0–34.0)
MCHC: 31.4 g/dL (ref 30.0–36.0)
MCV: 82.4 fL (ref 80.0–100.0)
Platelets: 179 10*3/uL (ref 150–400)
RBC: 4.76 MIL/uL (ref 3.87–5.11)
RDW: 15.4 % (ref 11.5–15.5)
WBC: 8.1 10*3/uL (ref 4.0–10.5)
nRBC: 0 % (ref 0.0–0.2)

## 2019-05-13 LAB — RENAL FUNCTION PANEL
Albumin: 3.4 g/dL — ABNORMAL LOW (ref 3.5–5.0)
Anion gap: 19 — ABNORMAL HIGH (ref 5–15)
BUN: 37 mg/dL — ABNORMAL HIGH (ref 8–23)
CO2: 22 mmol/L (ref 22–32)
Calcium: 9.3 mg/dL (ref 8.9–10.3)
Chloride: 94 mmol/L — ABNORMAL LOW (ref 98–111)
Creatinine, Ser: 7.62 mg/dL — ABNORMAL HIGH (ref 0.44–1.00)
GFR calc Af Amer: 6 mL/min — ABNORMAL LOW (ref >60)
GFR calc non Af Amer: 5 mL/min — ABNORMAL LOW (ref >60)
Glucose, Bld: 141 mg/dL — ABNORMAL HIGH (ref 70–99)
Phosphorus: 5.2 mg/dL — ABNORMAL HIGH (ref 2.5–4.6)
Potassium: 3.9 mmol/L (ref 3.5–5.1)
Sodium: 135 mmol/L (ref 135–145)

## 2019-05-13 LAB — GLUCOSE, CAPILLARY
Glucose-Capillary: 132 mg/dL — ABNORMAL HIGH (ref 70–99)
Glucose-Capillary: 138 mg/dL — ABNORMAL HIGH (ref 70–99)
Glucose-Capillary: 140 mg/dL — ABNORMAL HIGH (ref 70–99)
Glucose-Capillary: 163 mg/dL — ABNORMAL HIGH (ref 70–99)
Glucose-Capillary: 185 mg/dL — ABNORMAL HIGH (ref 70–99)

## 2019-05-13 LAB — MAGNESIUM: Magnesium: 2.1 mg/dL (ref 1.7–2.4)

## 2019-05-13 LAB — MRSA PCR SCREENING: MRSA by PCR: NEGATIVE

## 2019-05-13 MED ORDER — AMIODARONE LOAD VIA INFUSION
150.0000 mg | Freq: Once | INTRAVENOUS | Status: AC
  Administered 2019-05-13: 150 mg via INTRAVENOUS
  Filled 2019-05-13: qty 83.34

## 2019-05-13 MED ORDER — AMIODARONE HCL IN DEXTROSE 360-4.14 MG/200ML-% IV SOLN: 30.0000 mg/h | INTRAVENOUS | Status: DC

## 2019-05-13 MED ORDER — CHLORHEXIDINE GLUCONATE CLOTH 2 % EX PADS: 6.0000 | MEDICATED_PAD | Freq: Every day | CUTANEOUS | Status: DC

## 2019-05-13 MED ORDER — AMIODARONE HCL IN DEXTROSE 360-4.14 MG/200ML-% IV SOLN
60.0000 mg/h | INTRAVENOUS | Status: DC
  Administered 2019-05-13: 60 mg/h via INTRAVENOUS
  Filled 2019-05-13 (×2): qty 200

## 2019-05-13 MED ORDER — DOXERCALCIFEROL 4 MCG/2ML IV SOLN
4.5000 ug | INTRAVENOUS | Status: DC
  Administered 2019-05-14: 4.5 ug via INTRAVENOUS
  Filled 2019-05-13: qty 4

## 2019-05-13 NOTE — Consult Note (Signed)
Cardiology Consultation:   Patient ID: Alexis [name] MRN: [id number]; DOB: 06-08-1945  Admit date: 05/09/2019 Date of Consult: 05/13/2019  Primary Care Provider: System, Provider Not In Primary Cardiologist: Followed at [facility] Primary Electrophysiologist:  None    Patient Profile:   Alexis [name] is a 73 y.o. female with a hx of CAD and chronic systolic HFwho is being seen today for the evaluation of afib with RVR at the request of Dr Laural Benes.  History of Present Illness:   Alexis [name] 73 yo female history of CAD with prior stent in 2005 in setting of inferior MI based on prior notes, tricuspid regurgitation, ESRD, chronic LBBB, DM2, HL, HTN, systolic HF LVE 40-45%, pulmonary HTN PASP 73. She is followed at [facility]. Admitted from her dialysis center with tachycardia, found to be in new onset afib.  Mg 2.1 K 3.9 Cr 7.62 WBC 8.1 Plt 179  hstrop 71-->68 CXR moderate cardiomegaly, no acute process EKG afib, LBBB 04/2019 echo LVE 40-45%, inferior hypokinesis, mod RV dysfunction, severe LAE   FOr afib was started on dilt gtt. Rate control has been complicated by low bps -   Heart Pathway Score:     Past Medical History:  Diagnosis Date  . Arthritis   . Cancer [facility]) 2005    left breast  . CHF (congestive heart failure) (HCC)   . Chronic back pain   . Chronic kidney disease 03/2014   dialysis t/th/sa  . Coronary artery disease   . Diabetes mellitus    Type 2  . Diabetic nephropathy (HCC) 01-08-13  . Dyslipidemia 01-08-13  . ESRD on hemodialysis (HCC)    Tu, Th, Sat  . Headache   . Hyperlipidemia   . Hypertension   . LBBB (left bundle [name] block)   . Proteinuria 01-08-13  . Pulmonary hypertension (HCC)    PA peak pressure 73 mmHg 08/05/17 echo  . Thyroid disease 01-08-13   Hyper-parathyroidism-secondary  . Wears glasses     Past Surgical History:  Procedure Laterality Date  . A/V FISTULAGRAM N/A 08/25/2017   Procedure: A/V FISTULAGRAM - Left Arm;  Surgeon:  Chuck Hint, MD;  Location: [facility] INVASIVE CV LAB;  Service: Cardiovascular;  Laterality: N/A;  . A/V FISTULAGRAM N/A 02/09/2018   Procedure: A/V [id number];  Surgeon: Sherren Kerns, MD;  Location: [facility] INVASIVE CV LAB;  Service: Cardiovascular;  Laterality: N/A;  . A/V FISTULAGRAM Left 04/04/2018   Procedure: A/V FISTULAGRAM;  Surgeon: Cephus Shelling, MD;  Location: [facility] INVASIVE CV LAB;  Service: Cardiovascular;  Laterality: Left;  . ABDOMINAL AORTAGRAM N/A 08/11/2014   Procedure: ABDOMINAL Ronny Flurry;  Surgeon: Chuck Hint, MD;  Location: [facility] CATH LAB;  Service: Cardiovascular;  Laterality: N/A;  . ABDOMINAL HYSTERECTOMY    . APPENDECTOMY    . Arm surgery     Left arm trauma  . AV FISTULA PLACEMENT Left 08/21/2014   Procedure: LEFT ARM ARTERIOVENOUS (AV) FISTULA CREATION ;  Surgeon: Chuck Hint, MD;  Location: [facility] OR;  Service: Vascular;  Laterality: Left;  . AV FISTULA PLACEMENT Right 06/13/2018   Procedure: insertion of right arm ARTERIOVENOUS (AV) gore-tex GRAFT;  Surgeon: Larina Earthly, MD;  Location: [facility] OR;  Service: Vascular;  Laterality: Right;  . BACK SURGERY    . CATARACT EXTRACTION W/PHACO Left 12/22/2014   Procedure: CATARACT EXTRACTION PHACO AND INTRAOCULAR LENS PLACEMENT (IOC);  Surgeon: Gemma Payor, MD;  Location: [facility] ORS;  Service: Ophthalmology;  Laterality: Left;  CDE 13.48  .  CORONARY ANGIOPLASTY  12/19/2003   inferior wall hypokinesis. ef 50%  . dialysis catheter    . INSERTION OF DIALYSIS CATHETER Right 06/13/2018   Procedure: INSERTION OF DIALYSIS CATHETER, right internal jugular;  Surgeon: Larina Earthly, MD;  Location: [facility] OR;  Service: Vascular;  Laterality: Right;  . IR FLUORO GUIDE CV LINE RIGHT  04/06/2018  . IR REMOVAL TUN CV CATH W/O FL  04/25/2018  . IR REMOVAL TUN CV CATH W/O FL  07/20/2018  . IR US GUIDE VASC ACCESS RIGHT  04/06/2018  . LIGATION OF ARTERIOVENOUS  FISTULA Left 03/18/2019   Procedure: LIGATION OF ARTERIOVENOUS  FISTULA  LEFT  ARM;  Surgeon: Chuck Hint, MD;  Location: [facility] OR;  Service: Vascular;  Laterality: Left;  Marland Kitchen MASTECTOMY     Left  . PERIPHERAL VASCULAR BALLOON ANGIOPLASTY Left 08/25/2017   Procedure: PERIPHERAL VASCULAR BALLOON ANGIOPLASTY;  Surgeon: Chuck Hint, MD;  Location: [facility] INVASIVE CV LAB;  Service: Cardiovascular;  Laterality: Left;  arm fistula  . PERIPHERAL VASCULAR BALLOON ANGIOPLASTY Left 02/09/2018   Procedure: PERIPHERAL VASCULAR BALLOON ANGIOPLASTY;  Surgeon: Sherren Kerns, MD;  Location: [facility] INVASIVE CV LAB;  Service: Cardiovascular;  Laterality: Left;  AV Fistula  . PERIPHERAL VASCULAR BALLOON ANGIOPLASTY Left 04/04/2018   Procedure: PERIPHERAL VASCULAR BALLOON ANGIOPLASTY;  Surgeon: Cephus Shelling, MD;  Location: [facility] INVASIVE CV LAB;  Service: Cardiovascular;  Laterality: Left;  UPPER ARM FISTULA  . PERIPHERAL VASCULAR CATHETERIZATION N/A 09/16/2015   Procedure: Fistulagram;  Surgeon: Larina Earthly, MD;  Location: [facility] INVASIVE CV LAB;  Service: Cardiovascular;  Laterality: N/A;  . SPINE SURGERY    . TONSILLECTOMY    . TRANSTHORACIC ECHOCARDIOGRAM  10/01/2010    Left ventricle: The cavity size was mildly dilated. Wall thickness was increased in a pattern of mild LVH. Systolic function was   mildly reduced. The estimated ejection fraction was in the range  of 45% to 50%.   . TUBAL LIGATION       Inpatient Medications: Scheduled Meds: . amitriptyline  25 mg Oral QHS  . apixaban  5 mg Oral BID  . aspirin EC  81 mg Oral Daily  . atorvastatin  80 mg Oral Daily  . bisacodyl  10 mg Rectal Once  . calcium-vitamin D  1 tablet Oral Daily  . Chlorhexidine Gluconate Cloth  6 each Topical Daily  . Chlorhexidine Gluconate Cloth  6 each Topical Q0600  . Chlorhexidine Gluconate Cloth  6 each Topical Q0600  . docusate sodium  100 mg Oral QODAY  . [START ON 05/14/2019] doxercalciferol  4.5 mcg Intravenous Q T,Th,Sa-HD  . ferric citrate  420 mg Oral TID WC  . insulin aspart  0-5  Units Subcutaneous QHS  . insulin aspart  0-6 Units Subcutaneous TID WC  . metoprolol tartrate  25 mg Oral BID  . pantoprazole  40 mg Oral BID  . sevelamer carbonate  1,600 mg Oral TID WC  . sodium chloride flush  3 mL Intravenous Q12H   Continuous Infusions: . sodium chloride    . sodium chloride    . sodium chloride     PRN Meds: sodium chloride, sodium chloride, sodium chloride, acetaminophen **OR** acetaminophen, acetaminophen, albuterol, gabapentin, heparin, lidocaine (PF), lidocaine-prilocaine, metoprolol tartrate, nitroGLYCERIN, ondansetron **OR** ondansetron (ZOFRAN) IV, pentafluoroprop-tetrafluoroeth, polyethylene glycol, sodium chloride flush, tiZANidine  Allergies:    Allergies  Allergen Reactions  . Olmesartan Other (See Comments)    Hyperkalemia     Social History:   Social  History   Socioeconomic History  . Marital status: Legally Separated    Spouse name: Not on file  . Number of children: 3  . Years of education: Not on file  . Highest education level: Not on file  Occupational History  . Occupation: Retired  Tobacco Use  . Smoking status: Former Smoker    Packs/day: 1.00    Years: 50.00    Pack years: 50.00    Types: Cigarettes    Quit date: 05/16/1998    Years since quitting: 21.0  . Smokeless tobacco: Never Used  Substance and Sexual Activity  . Alcohol use: No    Alcohol/week: 0.0 standard drinks  . Drug use: No  . Sexual activity: Yes    Birth control/protection: Post-menopausal  Other Topics Concern  . Not on file  Social History Narrative   Lives at [place] alone (most of the time).  Three grandchildren and a one year old great grandchild   Social Determinants of Health   Financial Resource Strain:   . Difficulty of Paying Living Expenses: Not on file  Food Insecurity:   . Worried About Programme researcher, broadcasting/film/video in the Last Year: Not on file  . Ran Out of Food in the Last Year: Not on file  Transportation Needs:   . Lack of Transportation  (Medical): Not on file  . Lack of Transportation (Non-Medical): Not on file  Physical Activity:   . Days of Exercise per Week: Not on file  . Minutes of Exercise per Session: Not on file  Stress:   . Feeling of Stress : Not on file  Social Connections:   . Frequency of Communication with Friends and Family: Not on file  . Frequency of Social Gatherings with Friends and Family: Not on file  . Attends Religious Services: Not on file  . Active Member of Clubs or Organizations: Not on file  . Attends Banker Meetings: Not on file  . Marital Status: Not on file  Intimate Partner Violence:   . Fear of Current or Ex-Partner: Not on file  . Emotionally Abused: Not on file  . Physically Abused: Not on file  . Sexually Abused: Not on file    Family History:    Family History  Problem Relation Age of Onset  . Breast cancer Mother        Died age 53  . Cancer Mother 67       Breast  . Heart disease Mother   . Hyperlipidemia Mother   . Hypertension Mother   . Heart attack Mother      ROS:  Please see the history of present illness.  All other ROS reviewed and negative.     Physical Exam/Data:   Vitals:   05/13/19 0734 05/13/19 0800 05/13/19 0941 05/13/19 0942  BP:  122/82 (!) 112/98 (!) 112/98  Pulse:    (!) 115  Resp: (!) 22 (!) 21  20  Temp: 97.9 F (36.6 C)     TempSrc: Oral     SpO2:      Weight:      Height:        Intake/Output Summary (Last 24 hours) at 05/13/2019 1017 Last data filed at 05/13/2019 0947 Gross per 24 hour  Intake 6 ml  Output --  Net 6 ml   Last 3 Weights 05/13/2019 05/12/2019 05/11/2019  Weight (lbs) 218 lb 4.1 oz 220 lb 7.4 oz 217 lb 9.5 oz  Weight (kg) 99 kg 100 kg 98.7  kg     Body mass index is 35.23 kg/m.  General:  Well nourished, well developed, in no acute distress HEENT: normal Lymph: no adenopathy Neck: no JVD Endocrine:  No thryomegaly Cardiac:  Irreg, tachy, no m/r//g Lungs:  clear to auscultation bilaterally,  no wheezing, rhonchi or rales  Abd: soft, nontender, no hepatomegaly  Ext: no edema Musculoskeletal:  No deformities, BUE and BLE strength normal and equal Skin: warm and dry  Neuro:  CNs 2-12 intact, no focal abnormalities noted Psych:  Normal affect     Laboratory Data:  High Sensitivity Troponin:   Recent Labs  Lab 05/12/19 0849 05/12/19 1140  TROPONINIHS 71* 68*     Chemistry Recent Labs  Lab 05/11/19 0403 05/12/19 0431 05/13/19 0402  NA 134* 134* 135  K 3.9 3.7 3.9  CL 95* 93* 94*  CO2 25 26 22   GLUCOSE 132* 138* 141*  BUN 34* 24* 37*  CREATININE 7.95* 5.85* 7.62*  CALCIUM 8.9 9.3 9.3  GFRNONAA 5* 7* 5*  GFRAA 5* 8* 6*  ANIONGAP 14 15 19*    Recent Labs  Lab 05/13/19 0402  ALBUMIN 3.4*   Hematology Recent Labs  Lab 05/10/19 0520 05/11/19 0403 05/13/19 0402  WBC 8.0 8.8 8.1  RBC 4.71 4.63 4.76  HGB 12.3 12.1 12.3  HCT 38.3 37.9 39.2  MCV 81.3 81.9 82.4  MCH 26.1 26.1 25.8*  MCHC 32.1 31.9 31.4  RDW 15.6* 15.8* 15.4  PLT 160 187 179   BNPNo results for input(s): BNP, PROBNP in the last 168 hours.  DDimer No results for input(s): DDIMER in the last 168 hours.   Radiology/Studies:  DG CHEST PORT 1 VIEW  Result Date: 05/12/2019 CLINICAL DATA:  Tachycardia. End-stage renal disease. EXAM: PORTABLE CHEST 1 VIEW COMPARISON:  May 09, 2019 FINDINGS: The mediastinal contour is normal. The heart size is enlarged. The lungs are clear. There is no focal infiltrate, pulmonary edema, or pleural effusion. Soft tissues and osseous structures are stable. IMPRESSION: No active cardiopulmonary disease. Electronically Signed   By: Sherian Rein M.D.   On: 05/12/2019 09:06   DG Chest Port 1 View  Result Date: 05/09/2019 CLINICAL DATA:  Acute onset of atrial fibrillation after hemodialysis earlier today. EXAM: PORTABLE CHEST 1 VIEW COMPARISON:  09/07/2018 and earlier. FINDINGS: Cardiac silhouette moderately to markedly enlarged, unchanged. Pulmonary vascularity  normal. Lungs clear. No pleural effusions. Stable chronic mild elevation of the RIGHT hemidiaphragm. IMPRESSION: 1. Stable moderate to marked cardiomegaly. No acute cardiopulmonary disease. 2. Stable chronic elevation of the RIGHT hemidiaphragm. Electronically Signed   By: Hulan Saas M.D.   On: 05/09/2019 11:23   DG Abd Portable 1V  Result Date: 05/12/2019 CLINICAL DATA:  No indication provided. EXAM: PORTABLE ABDOMEN - 1 VIEW COMPARISON:  December 26, 2018 FINDINGS: The bowel gas pattern is normal. No radio-opaque calculi or other significant radiographic abnormality are seen. IMPRESSION: No acute abdominal findings. Electronically Signed   By: Sherian Rein M.D.   On: 05/12/2019 09:08   ECHOCARDIOGRAM COMPLETE  Result Date: 05/12/2019   ECHOCARDIOGRAM REPORT   Patient Name:   Alexis [name] Date of Exam: 05/09/2019 Medical Rec #:  [id number]      Height:       66.0 in Accession #:    [id number]     Weight:       274.0 lb Date of Birth:  [birth year]      BSA:          2.29 m  Patient Age:    73 years       BP:           113/64 mmHg Patient Gender: F              HR:           125 bpm. [facility] Procedure: 2D Echo Indications:    Atrial Fibrillation 427.31 / I48.91  History:        Patient has prior history of Echocardiogram examinations, most                 recent 08/05/2017. CHF, CAD, Arrythmias:Atrial Fibrillation,                 Signs/Symptoms:Chest Pain; Risk Factors:Hypertension and                 Diabetes. ESRD, Elevated troponin.  Sonographer:    Jeryl Columbia RDCS (AE) Referring Phys: [phone] [facility] IMPRESSIONS  1. Left ventricular ejection fraction, by visual estimation, is 40 to 45%. The left ventricle has moderately decreased function. There is moderately increased left ventricular hypertrophy.  2. Severe hypokinesis of the left ventricular, basal-mid inferior wall, inferoseptal wall and inferolateral wall.  3. Left ventricular diastolic function could not be  evaluated.  4. The left ventricle demonstrates regional wall motion abnormalities.  5. Global right ventricle has moderately reduced systolic function.The right ventricular size is moderately enlarged. Right vetricular wall thickness was not assessed.  6. Left atrial size was severely dilated.  7. Right atrial size was severely dilated.  8. The pericardial effusion is posterior to the left ventricle.  9. Trivial pericardial effusion is present. 10. Moderate mitral annular calcification. 11. The mitral valve is degenerative. Mild to moderate mitral valve regurgitation. 12. The tricuspid valve is normal in structure. 13. The aortic valve is tricuspid. Aortic valve regurgitation is not visualized. Mild aortic valve sclerosis without stenosis. 14. The pulmonic valve was not well visualized. Pulmonic valve regurgitation is not visualized. 15. Mildly elevated pulmonary artery systolic pressure. 16. The inferior vena cava is normal in size with greater than 50% respiratory variability, suggesting right atrial pressure of 3 mmHg. 17. No significant change from prior study. 18. Prior images reviewed side by side. FINDINGS  Left Ventricle: Left ventricular ejection fraction, by visual estimation, is 40 to 45%. The left ventricle has moderately decreased function. Severe hypokinesis of the left ventricular, basal-mid inferior wall, inferoseptal wall and inferolateral wall. The left ventricle demonstrates regional wall motion abnormalities. The left ventricular internal cavity size was the left ventricle is normal in size. There is moderately increased left ventricular hypertrophy. Concentric left ventricular hypertrophy. The left ventricular diastology could not be evaluated due to atrial fibrillation. Left ventricular diastolic function could not be evaluated. Right Ventricle: The right ventricular size is moderately enlarged. Right vetricular wall thickness was not assessed. Global RV systolic function is has moderately  reduced systolic function. The tricuspid regurgitant velocity is 2.62 m/s, and with an assumed right atrial pressure of 10 mmHg, the estimated right ventricular systolic pressure is mildly elevated at 37.5 mmHg. Left Atrium: Left atrial size was severely dilated. Right Atrium: Right atrial size was severely dilated Pericardium: Trivial pericardial effusion is present. The pericardial effusion is posterior to the left ventricle. Mitral Valve: The mitral valve is degenerative in appearance. Moderate mitral annular calcification. Mild to moderate mitral valve regurgitation, with centrally-directed jet. Tricuspid Valve: The tricuspid valve is normal in structure. Tricuspid valve regurgitation is  trivial. Aortic Valve: The aortic valve is tricuspid. Aortic valve regurgitation is not visualized. Mild aortic valve sclerosis is present, with no evidence of aortic valve stenosis. Pulmonic Valve: The pulmonic valve was not well visualized. Pulmonic valve regurgitation is not visualized. Pulmonic regurgitation is not visualized. Aorta: The aortic root is normal in size and structure. Venous: The inferior vena cava is normal in size with greater than 50% respiratory variability, suggesting right atrial pressure of 3 mmHg. IAS/Shunts: No atrial level shunt detected by color flow Doppler.  LEFT VENTRICLE PLAX 2D LVIDd:         4.81 cm  Diastology LVIDs:         3.97 cm  LV e' lateral:   4.05 cm/s LV PW:         1.42 cm  LV E/e' lateral: 32.6 LV IVS:        1.46 cm  LV e' medial:    4.87 cm/s LVOT diam:     1.90 cm  LV E/e' medial:  27.1 LV SV:         39 ml LV SV Index:   15.89 LVOT Area:     2.84 cm  RIGHT VENTRICLE RV S prime:     5.43 cm/s TAPSE (M-mode): 0.9 cm LEFT ATRIUM              Index       RIGHT ATRIUM           Index LA diam:        4.90 cm  2.14 cm/m  RA Area:     26.80 cm LA Vol (A2C):   80.3 ml  35.13 ml/m RA Volume:   89.50 ml  39.15 ml/m LA Vol (A4C):   100.0 ml 43.74 ml/m LA Biplane Vol: 92.9 ml  40.64  ml/m   AORTA Ao Root diam: 2.70 cm MITRAL VALVE                        TRICUSPID VALVE MV Area (PHT): 5.38 cm             TR Peak grad:   27.5 mmHg MV PHT:        40.89 msec           TR Vmax:        266.00 cm/s MV Decel Time: 141 msec MR Peak grad: 66.3 mmHg             SHUNTS MR Mean grad: 44.0 mmHg             Systemic Diam: 1.90 cm MR Vmax:      407.00 cm/s MR Vmean:     312.0 cm/s MV E velocity: 132.00 cm/s 103 cm/s  Rachelle Hora Croitoru MD Electronically signed by Thurmon Fair MD Signature Date/Time: 05/12/2019/11:42:51 AM    Final    {   Assessment and Plan:   1. Afib with RVR - new diagnosis this admission - rate control complicated by soft bp's - started on eliquis, would stop ASA - currently on lopressor 25mg  bid for rate control. HOme bp meds held for room with bp - start amio gtt in setting of uncontrolled afib with RVR with soft bp's in patient with systolic HF and CAD, ESRD.   -there are no plans for a TEE/DCCV at this time. With her severe LAE dont think she would hold SR on her own, could consider once loaded on amiodarone   2. CAD -  no active issues  3. Chronic systolic HF - fluid management per HD - does not appear volume overloaded - home hydral and imdur on hold due to soft bp's   For questions or updates, please contact [facility] Please consult [email or web address] for contact info under     Signed, Dina Rich, MD  05/13/2019 10:17 AM

## 2019-05-13 NOTE — Progress Notes (Addendum)
Amiodarone paused at 2105 due to hypotension. Midlevel coverage contacted and per midlevel's verbal order due to severe hypotension keep drip paused. To call back with further orders. Patient is asymptomatic. Continue to monitor.

## 2019-05-13 NOTE — Progress Notes (Signed)
PROGRESS NOTE [facility]   CECIL BIXBY  DTO:[id number]  DOB: [birth year]  DOA: 05/09/2019 PCP: System, Provider Not In   Brief Admission Hx: 73 year old female with end-stage renal disease on HD TTS, hypertension, diabetes mellitus, coronary artery disease, stable angina, HFrEF, grade 2 diastolic dysfunction, dyslipidemia presented to the ED by EMS from her dialysis facility with tachycardia and noted to be in atrial fibrillation with RVR which was a new onset for her.  She was asymptomatic from this.  MDM/Assessment & Plan:   1. Atrial fibrillation with RVR-asymptomatic and initially required temporary Cardizem infusion and then subsequently started on oral metoprolol 25 mg twice a day.  Her BPs have been soft.  Her home hydralazine and Imdur has been held due to soft blood pressure readings.  2D echocardiogram has been performed with results noted below not a significant change from prior echo.  Patient has been frustrated and wanting to discharge home however we have not been able to get her heart rate fully controlled so that she could go home safely.  Her TSH has been within normal limits.  We will asked the cardiology team to help with managing heart rate.  She is on apixaban for full anticoagulation dosed by the Pharm.D.  Cardiology saw her 12/28 and started amiodarone infusion.  2. End-stage renal disease on HD TTS-patient had hemodialysis 05/11/2019 and tolerated it well.  Nephrology has been following her.  She is due to be treated 12/29 on her regular schedule. 3. Chest pain/angina-patient has a history of angina and complained of chest pain 12/27 but work up was unrevealing and likely was GI related, resolved with peptobismol x 1 dose.   EKG that was done with no acute findings.  High-sensitivity troponin with negative delta change.  She was given a sublingual nitroglycerin with no change in symptom.  I think her symptoms were GI related.  I do not suspect ACS.  Her chest x-ray  and abdominal x-rays were personally reviewed she does have abdominal gas that could be contributing to her symptoms.  Laxatives ordered. 4. Hypokalemia-this is been repleted. 5. Combined systolic diastolic CHF-her echocardiogram was repeated and EF is 40 to 45% severe hypokinesis of the left ventricular, basal-mid inferior wall, inferoseptal wall and inferolateral wall. 6. Essential hypertension-the patient has been having soft blood pressures which has made titrating the metoprolol more difficult.  We are holding her home Coreg Imdur and hydralazine at this time. 7. Dyslipidemia-she has been resumed on her home statin therapy. 8. GERD-she is having symptoms and I have added Protonix and gave her a one-time dose of Pepcid. 9. Type 2 diabetes mellitus-she has been resumed on her home insulin regimen and supplemental sliding scale coverage.  Continue to monitor CBGs.   DVT prophylaxis: Apixaban Code Status: Full Family Communication: Patient updated at bedside, verbalized understanding I had been unable to reach husband. Disposition Plan: Home when medically stabilized, AFib RVR is still uncontrolled.   Consultants:    Procedures: Echocardiogram IMPRESSIONS  1. Left ventricular ejection fraction, by visual estimation, is 40 to 45%. The left ventricle has moderately decreased function. There is moderately increased left ventricular hypertrophy.  2. Severe hypokinesis of the left ventricular, basal-mid inferior wall, inferoseptal wall and inferolateral wall.  3. Left ventricular diastolic function could not be evaluated.  4. The left ventricle demonstrates regional wall motion abnormalities.  5. Global right ventricle has moderately reduced systolic function.The right ventricular size is moderately enlarged. Right vetricular wall thickness was not assessed.  6. Left atrial size was severely dilated.  7. Right atrial size was severely dilated.  8. The pericardial effusion is posterior to the  left ventricle.  9. Trivial pericardial effusion is present. 10. Moderate mitral annular calcification. 11. The mitral valve is degenerative. Mild to moderate mitral valve regurgitation. 12. The tricuspid valve is normal in structure. 13. The aortic valve is tricuspid. Aortic valve regurgitation is not visualized. Mild aortic valve sclerosis without stenosis. 14. The pulmonic valve was not well visualized. Pulmonic valve regurgitation is not visualized. 15. Mildly elevated pulmonary artery systolic pressure. 16. The inferior vena cava is normal in size with greater than 50% respiratory variability, suggesting right atrial pressure of 3 mmHg. 17. No significant change from prior study. 18. Prior images reviewed side by side.  Antimicrobials:  n/a   Subjective: Patient says she slept better last night, no further chest pain.  Objective: Vitals:   05/13/19 0800 05/13/19 0941 05/13/19 0942 05/13/19 1116  BP: 122/82 (!) 112/98 (!) 112/98   Pulse:   (!) 115   Resp: (!) 21  20 16   Temp:    98.3 F (36.8 C)  TempSrc:    Oral  SpO2:      Weight:      Height:        Intake/Output Summary (Last 24 hours) at 05/13/2019 1119 Last data filed at 05/13/2019 0947 Gross per 24 hour  Intake 6 ml  Output --  Net 6 ml   Filed Weights   05/11/19 1100 05/12/19 0500 05/13/19 0500  Weight: 98.7 kg 100 kg 99 kg   REVIEW OF SYSTEMS  As per history otherwise all reviewed and reported negative  Exam:  General exam: Chronically ill-appearing female sitting up in the bed she is awake and alert and does not appear to be in any distress. Respiratory system: Clear. No increased work of breathing. Cardiovascular system: Normal S1 & S2 heard. No JVD, murmurs, gallops, clicks or pedal edema. Gastrointestinal system: Abdomen is nondistended, soft and mild epigastric tenderness with deep palpation but no guarding. Normal bowel sounds heard. Central nervous system: Alert and oriented. No focal  neurological deficits. Extremities: no CCE.  Data Reviewed: Basic Metabolic Panel: Recent Labs  Lab 05/09/19 1134 05/10/19 0520 05/11/19 0403 05/12/19 0431 05/13/19 0402  NA 136 136 134* 134* 135  K 3.0* 3.8 3.9 3.7 3.9  CL 97* 97* 95* 93* 94*  CO2 26 25 25 26 22   GLUCOSE 83 143* 132* 138* 141*  BUN 16 25* 34* 24* 37*  CREATININE 4.35* 6.18* 7.95* 5.85* 7.62*  CALCIUM 8.6* 8.8* 8.9 9.3 9.3  MG 1.8 1.8 2.0  --  2.1  PHOS  --   --   --   --  5.2*   Liver Function Tests: Recent Labs  Lab 05/13/19 0402  ALBUMIN 3.4*   No results for input(s): LIPASE, AMYLASE in the last 168 hours. No results for input(s): AMMONIA in the last 168 hours. CBC: Recent Labs  Lab 05/09/19 1134 05/10/19 0520 05/11/19 0403 05/13/19 0402  WBC 8.5 8.0 8.8 8.1  HGB 11.8* 12.3 12.1 12.3  HCT 37.2 38.3 37.9 39.2  MCV 80.7 81.3 81.9 82.4  PLT 148* 160 187 179   Cardiac Enzymes: No results for input(s): CKTOTAL, CKMB, CKMBINDEX, TROPONINI in the last 168 hours. CBG (last 3)  Recent Labs    05/13/19 0017 05/13/19 0733 05/13/19 1115  GLUCAP 163* 132* 138*   Recent Results (from the past 240 hour(s))  SARS Coronavirus  2 by RT PCR (hospital order, performed in [place] hospital lab)     Status: None   Collection Time: 05/09/19  6:04 PM  Result Value Ref Range Status   SARS Coronavirus 2 NEGATIVE NEGATIVE Final    Comment: (NOTE) SARS-CoV-2 target nucleic acids are NOT DETECTED. The SARS-CoV-2 RNA is generally detectable in upper and lower respiratory specimens during the acute phase of infection. The lowest concentration of SARS-CoV-2 viral copies this assay can detect is 250 copies / mL. A negative result does not preclude SARS-CoV-2 infection and should not be used as the sole basis for treatment or other patient management decisions.  A negative result may occur with improper specimen collection / handling, submission of specimen other than nasopharyngeal swab, presence of viral  mutation(s) within the areas targeted by this assay, and inadequate number of viral copies (<250 copies / mL). A negative result must be combined with clinical observations, patient history, and epidemiological information. Fact Sheet for Patients:   StrictlyIdeas.no Fact Sheet for Healthcare Providers: BankingDealers.co.za This test is not yet approved or cleared  by the [place] FDA and has been authorized for detection and/or diagnosis of SARS-CoV-2 by FDA under an Emergency Use Authorization (EUA).  This EUA will remain in effect (meaning this test can be used) for the duration of the COVID-19 declaration under Section 564(b)(1) of the Act, 21 U.S.C. section 360bbb-3(b)(1), unless the authorization is terminated or revoked sooner. Performed at [facility], [address], [place], [place] [address]      Studies: DG CHEST PORT 1 VIEW  Result Date: 05/12/2019 CLINICAL DATA:  Tachycardia. End-stage renal disease. EXAM: PORTABLE CHEST 1 VIEW COMPARISON:  May 09, 2019 FINDINGS: The mediastinal contour is normal. The heart size is enlarged. The lungs are clear. There is no focal infiltrate, pulmonary edema, or pleural effusion. Soft tissues and osseous structures are stable. IMPRESSION: No active cardiopulmonary disease. Electronically Signed   By: Abelardo Diesel M.D.   On: 05/12/2019 09:06   DG Abd Portable 1V  Result Date: 05/12/2019 CLINICAL DATA:  No indication provided. EXAM: PORTABLE ABDOMEN - 1 VIEW COMPARISON:  December 26, 2018 FINDINGS: The bowel gas pattern is normal. No radio-opaque calculi or other significant radiographic abnormality are seen. IMPRESSION: No acute abdominal findings. Electronically Signed   By: Abelardo Diesel M.D.   On: 05/12/2019 09:08     Scheduled Meds: . amiodarone  150 mg Intravenous Once  . amitriptyline  25 mg Oral QHS  . apixaban  5 mg Oral BID  . atorvastatin  80 mg Oral Daily  . bisacodyl   10 mg Rectal Once  . calcium-vitamin D  1 tablet Oral Daily  . Chlorhexidine Gluconate Cloth  6 each Topical Daily  . Chlorhexidine Gluconate Cloth  6 each Topical Q0600  . Chlorhexidine Gluconate Cloth  6 each Topical Q0600  . docusate sodium  100 mg Oral QODAY  . [START ON 05/14/2019] doxercalciferol  4.5 mcg Intravenous Q T,Th,Sa-HD  . ferric citrate  420 mg Oral TID WC  . insulin aspart  0-5 Units Subcutaneous QHS  . insulin aspart  0-6 Units Subcutaneous TID WC  . metoprolol tartrate  25 mg Oral BID  . pantoprazole  40 mg Oral BID  . sevelamer carbonate  1,600 mg Oral TID WC  . sodium chloride flush  3 mL Intravenous Q12H   Continuous Infusions: . sodium chloride    . sodium chloride    . sodium chloride    .  amiodarone     Followed by  . amiodarone      Principal Problem:   Atrial fibrillation with RVR (HCC) Active Problems:   CHF (congestive heart failure) (HCC)   CAD (coronary artery disease)   Chest pain   ESRD (end stage renal disease) (HCC)   Dyspnea   ESRD on hemodialysis (HCC)   Hypokalemia   Hyperlipidemia   Pulmonary hypertension (HCC)   GERD (gastroesophageal reflux disease)   Stable angina ([place])   Time spent:   Irwin Brakeman, MD [facility] 05/13/2019, 11:19 AM    LOS: 4 days  How to contact the [facility] Attending or Consulting provider [place] or covering provider during after hours [place], for this patient?  1. Check the care team in [facility] and look for a) attending/consulting TRH provider listed and b) the [facility] team listed 2. Log into [email or web address] and use [place]'s universal password to access. If you do not have the password, please contact the hospital operator. 3. Locate the [facility] provider you are looking for under [facility] and page to a number that you can be directly reached. 4. If you still have difficulty reaching the provider, please page the [facility] (Director on Call) for the Hospitalists listed on amion for assistance.

## 2019-05-13 NOTE — Progress Notes (Signed)
B/P 93/48 MAP 62 reported to B[place], PA following orders to report diastolic less than 60.  Patient in no acute distress.

## 2019-05-13 NOTE — Progress Notes (Signed)
Per midlevel NP, continue to hold Amiodarone drip as this as helped increase her blood pressure to a stable level. Monitor patient's heart rate. If patient begins to experience Afib or unstable sinus tachycardia page midlevel NP again. Continue to monitor.

## 2019-05-13 NOTE — Progress Notes (Signed)
[facility] Progress Note  Name: Alexis [name] MRN: [id number] DOB: Mar 11, 1946  Chief Complaint:  Elevated HR   Subjective:  Feels ok.  Still in ICU.  HR 120's.  Had HD on 12/26 with 1 kg UF.  Nursing states she hasn't been NPO - had breakfast; wsa to have TEE today per cardiology. TEE was rescheduled to tomorrow per cardiology.    Review of systems:  Denies shortness of breath  Denies n/v Denies chest pain  ------- Background on consult:   Patient has a PMH significant for obesity, DM, HTN, combined diastolic and systolic CHF, and ESRD who presented to [facility] ED after HD due to persistent tachycardia.  In the ED she was noted to have new onset atrial fibrillation with RVR and relative hypotension.  She was admitted on 05/09/19 for rate control with cardizem drip and workup.  We were consulted to provide HD while she remains an inpatient.   Intake/Output Summary (Last 24 hours) at 05/13/2019 0817 Last data filed at 05/12/2019 2123 Gross per 24 hour  Intake 3 ml  Output --  Net 3 ml    Vitals:  Vitals:   05/13/19 0500 05/13/19 0600 05/13/19 0700 05/13/19 0734  BP:  91/72 98/68   Pulse:      Resp:  (!) 24  (!) 22  Temp:    97.9 F (36.6 C)  TempSrc:    Oral  SpO2:      Weight: 99 kg     Height:         Physical Exam:  General adult female in bed in no acute distress HEENT normocephalic atraumatic extraocular movements intact sclera anicteric Neck supple trachea midline Lungs clear to auscultation bilaterally normal work of breathing at rest; room air Heart irregularly irregular; tachy 120's; no rub Abdomen soft nontender nondistended Extremities no edema appreciated Psych normal mood and affect RUE graft thrill/bruit   Medications reviewed    Labs:  BMP Latest Ref Rng & Units 05/13/2019 05/12/2019 05/11/2019  Glucose 70 - 99 mg/dL 141(H) 138(H) 132(H)  BUN 8 - 23 mg/dL 37(H) 24(H) 34(H)  Creatinine 0.44 - 1.00 mg/dL 7.62(H) 5.85(H) 7.95(H)   Sodium 135 - 145 mmol/L 135 134(L) 134(L)  Potassium 3.5 - 5.1 mmol/L 3.9 3.7 3.9  Chloride 98 - 111 mmol/L 94(L) 93(L) 95(L)  CO2 22 - 32 mmol/L 22 26 25   Calcium 8.9 - 10.3 mg/dL 9.3 9.3 8.9   Outpatient orders:  Center: [facility]  on TTS . EDW 98.5kg HD Bath 2K/2.5Ca  Time 4:15 Heparin 2000 units IVP then 1000/hr. Access RAVG BFR 400 DFR 600    Hectoral 4.5 mcg IV/HD Epogen 4000   Units IV/HD  Venofer  50 mg IV q Tuesday  Other sensipar 30 mg qtx   Assessment/Plan:   1.  Atrial fibrillation with RVR- new onset during HD on 05/09/19. Control per primary team.  Cardiology plans for TEE on 12/29  2.  ESRD -  HD per TTS schedule, next on 12/29 after TEE   3.  Hypertension/volume  - no overt overload; on room air    4.  Anemia  - no acute indication for ESA.  Hb above goal   5.  Metabolic bone disease -  Continue with outpatient meds; mild hyperphos   6.  Nutrition - renal diet  7. DM- per primary  8. Combined systolic and diastolic CHF- stable  Disposition- currently in ICU   Claudia Desanctis, MD 05/13/2019 8:17 AM\

## 2019-05-14 LAB — RENAL FUNCTION PANEL
Albumin: 3.3 g/dL — ABNORMAL LOW (ref 3.5–5.0)
Anion gap: 19 — ABNORMAL HIGH (ref 5–15)
BUN: 51 mg/dL — ABNORMAL HIGH (ref 8–23)
CO2: 22 mmol/L (ref 22–32)
Calcium: 9 mg/dL (ref 8.9–10.3)
Chloride: 93 mmol/L — ABNORMAL LOW (ref 98–111)
Creatinine, Ser: 9.78 mg/dL — ABNORMAL HIGH (ref 0.44–1.00)
GFR calc Af Amer: 4 mL/min — ABNORMAL LOW (ref >60)
GFR calc non Af Amer: 4 mL/min — ABNORMAL LOW (ref >60)
Glucose, Bld: 158 mg/dL — ABNORMAL HIGH (ref 70–99)
Phosphorus: 6.3 mg/dL — ABNORMAL HIGH (ref 2.5–4.6)
Potassium: 4.4 mmol/L (ref 3.5–5.1)
Sodium: 134 mmol/L — ABNORMAL LOW (ref 135–145)

## 2019-05-14 LAB — GLUCOSE, CAPILLARY
Glucose-Capillary: 111 mg/dL — ABNORMAL HIGH (ref 70–99)
Glucose-Capillary: 153 mg/dL — ABNORMAL HIGH (ref 70–99)
Glucose-Capillary: 146 mg/dL — ABNORMAL HIGH (ref 70–99)
Glucose-Capillary: 108 mg/dL — ABNORMAL HIGH (ref 70–99)

## 2019-05-14 LAB — CORTISOL: Cortisol, Plasma: 12.7 ug/dL

## 2019-05-14 MED ORDER — CHLORHEXIDINE GLUCONATE CLOTH 2 % EX PADS
6.0000 | MEDICATED_PAD | Freq: Every day | CUTANEOUS | Status: DC
  Administered 2019-05-14: 6 via TOPICAL

## 2019-05-14 MED ORDER — AMIODARONE HCL 200 MG PO TABS
400.0000 mg | ORAL_TABLET | Freq: Two times a day (BID) | ORAL | Status: DC
  Administered 2019-05-14 – 2019-05-15 (×3): 400 mg via ORAL
  Filled 2019-05-14 (×3): qty 2

## 2019-05-14 MED ORDER — SODIUM CHLORIDE 0.9 % IV BOLUS
500.0000 mL | Freq: Once | INTRAVENOUS | Status: AC
  Administered 2019-05-14: 500 mL via INTRAVENOUS

## 2019-05-14 MED ORDER — MIDODRINE HCL 5 MG PO TABS
5.0000 mg | ORAL_TABLET | Freq: Three times a day (TID) | ORAL | Status: DC
  Administered 2019-05-14 – 2019-05-15 (×3): 5 mg via ORAL
  Filled 2019-05-14 (×3): qty 1

## 2019-05-14 MED ORDER — METOPROLOL TARTRATE 25 MG PO TABS: 12.5000 mg | ORAL_TABLET | Freq: Two times a day (BID) | ORAL | Status: DC

## 2019-05-14 NOTE — TOC Initial Note (Signed)
Transition of Care [facility]) - Initial/Assessment Note    Patient Details  Name: Alexis [name] MRN: [id number] Date of Birth: January 17, 1946  [facility]) CM/SW Contact:    [place] Ganoe, Chrystine Oiler, RN Phone Number: 05/14/2019, 3:24 PM  Clinical Narrative:     Afib with RVR. FRom home with husband, has cap aide services. Discussed SNF vs home with supervision. Husband and patient would like for patient to return home with home health PT. She has all necessary DME.               Expected Discharge Plan: Skilled Nursing Facility Barriers to Discharge: Continued Medical Work up   Patient Goals and CMS Choice   CMS Medicare.gov Compare Post Acute Care list provided to:: Other (Comment Required)(husband) Choice offered to / list presented to : Patient  Expected Discharge Plan and Services Expected Discharge Plan: Skilled Nursing Facility   Discharge Planning Services: CM Consult Post Acute Care Choice: Home Health Living arrangements for the past 2 months: Single Family Home                               Date [facility] Agency Contacted: 05/14/19 Time HH Agency Contacted: 1522 Representative spoke with at [facility] Agency: Bonita Quin  Prior Living Arrangements/Services Living arrangements for the past 2 months: Single Family Home Lives with:: Spouse   Do you feel safe going back to the place where you live?: No      Need for Family Participation in Patient Care: Yes (Comment) Care giver support system in place?: Yes (comment)   Criminal Activity/Legal Involvement Pertinent to Current Situation/Hospitalization: No - Comment as needed  Activities of Daily Living Home Assistive Devices/Equipment: Walker (specify type), CBG Meter, Grab bars in shower ADL Screening (condition at time of admission) Patient's cognitive ability adequate to safely complete daily activities?: Yes Is the patient deaf or have difficulty hearing?: No Does the patient have difficulty seeing, even when wearing  glasses/contacts?: No Does the patient have difficulty concentrating, remembering, or making decisions?: No Patient able to express need for assistance with ADLs?: Yes Does the patient have difficulty dressing or bathing?: Yes Independently performs ADLs?: No Communication: Needs assistance Is this a change from baseline?: Pre-admission baseline Dressing (OT): Needs assistance Is this a change from baseline?: Pre-admission baseline Grooming: Needs assistance Is this a change from baseline?: Pre-admission baseline Feeding: Needs assistance Is this a change from baseline?: Pre-admission baseline Bathing: Needs assistance Is this a change from baseline?: Pre-admission baseline Toileting: Needs assistance Is this a change from baseline?: Pre-admission baseline In/Out Bed: Needs assistance Is this a change from baseline?: Pre-admission baseline Walks in Home: Needs assistance Is this a change from baseline?: Pre-admission baseline Does the patient have difficulty walking or climbing stairs?: Yes Weakness of Legs: None Weakness of Arms/Hands: None           Admission diagnosis:  Hypokalemia [E87.6] Atrial fibrillation with rapid ventricular response (HCC) [I48.91] Atrial fibrillation with RVR (HCC) [I48.91] Patient Active Problem List   Diagnosis Date Noted  . Hypokalemia 05/12/2019  . Hyperlipidemia   . Pulmonary hypertension (HCC)   . GERD (gastroesophageal reflux disease)   . Stable angina (HCC)   . Atrial fibrillation with RVR (HCC) 05/09/2019  . Elevated troponin   . ESRD on hemodialysis (HCC)   . ESRD (end stage renal disease) (HCC) 08/13/2017  . Dyspnea 08/13/2017  . Chest pain 08/04/2017  . Spinal stenosis of lumbar  region with neurogenic claudication 04/28/2016  . Essential hypertension 08/15/2012  . CAD (coronary artery disease) 03/25/2011  . Type II diabetes mellitus with manifestations (HCC) 03/25/2011  . Chest pain 03/25/2011  . CHF (congestive heart failure)  (HCC) 10/14/2010  . CKD (chronic kidney disease) 10/14/2010  . Obesity 10/14/2010   PCP:  System, Provider Not In Pharmacy:   [facility] [phone] - [place], [place] - [address] AT [facility] [address] [place] [address] Phone: [phone] Fax: [phone]     Social Determinants of Health (SDOH) Interventions    Readmission Risk Interventions No flowsheet data found.

## 2019-05-14 NOTE — Procedures (Signed)
    HEMODIALYSIS TREATMENT NOTE:  SBP in 90s pre-dialysis.  HR 100-110 Afib.  HD was initiated with 0.5L goal.  Pt had an asymptomatic episode of hypotension 75/65 after 45 minutes of therapy at which time she also converted to NSR.  UF was suspended for 30 minutes total while BP stabilized.  4 hour treatment completed. Goal met: 532 cc removed.  All blood was returned and hemostasis was achieved in 10 minutes.   , RN 

## 2019-05-14 NOTE — Plan of Care (Signed)
  Problem: Acute Rehab PT Goals(only PT should resolve) Goal: Pt Will Go Supine/Side To Sit Outcome: Progressing Flowsheets (Taken 05/14/2019 1108) Pt will go Supine/Side to Sit: with minimal assist Goal: Patient Will Transfer Sit To/From Stand Outcome: Progressing Flowsheets (Taken 05/14/2019 1108) Patient will transfer sit to/from stand: with min guard assist Goal: Pt Will Transfer Bed To Chair/Chair To Bed Outcome: Progressing Flowsheets (Taken 05/14/2019 1108) Pt will Transfer Bed to Chair/Chair to Bed:  min guard assist  with min assist Goal: Pt Will Ambulate Outcome: Progressing Flowsheets (Taken 05/14/2019 1108) Pt will Ambulate:  50 feet  with min guard assist  with minimal assist  with rolling walker   11:09 AM, 05/14/19 Lonell Grandchild, MPT Physical Therapist with [facility], [phone] office [phone] mobile phone

## 2019-05-14 NOTE — Progress Notes (Signed)
Progress Note  Patient Name: Alexis [name] Date of Encounter: 05/14/2019  Primary Cardiologist: [facility]  Subjective   No complaits  Inpatient Medications    Scheduled Meds: . amitriptyline  25 mg Oral QHS  . apixaban  5 mg Oral BID  . atorvastatin  80 mg Oral Daily  . bisacodyl  10 mg Rectal Once  . calcium-vitamin D  1 tablet Oral Daily  . Chlorhexidine Gluconate Cloth  6 each Topical Q0600  . docusate sodium  100 mg Oral QODAY  . doxercalciferol  4.5 mcg Intravenous Q T,Th,Sa-HD  . ferric citrate  420 mg Oral TID WC  . insulin aspart  0-5 Units Subcutaneous QHS  . insulin aspart  0-6 Units Subcutaneous TID WC  . metoprolol tartrate  12.5 mg Oral BID  . pantoprazole  40 mg Oral BID  . sevelamer carbonate  1,600 mg Oral TID WC  . sodium chloride flush  3 mL Intravenous Q12H   Continuous Infusions: . sodium chloride    . sodium chloride    . sodium chloride    . amiodarone Stopped (05/13/19 2100)   PRN Meds: sodium chloride, sodium chloride, sodium chloride, acetaminophen **OR** acetaminophen, acetaminophen, albuterol, gabapentin, heparin, lidocaine (PF), lidocaine-prilocaine, nitroGLYCERIN, ondansetron **OR** ondansetron (ZOFRAN) IV, pentafluoroprop-tetrafluoroeth, polyethylene glycol, sodium chloride flush, tiZANidine   Vital Signs    Vitals:   05/14/19 0430 05/14/19 0500 05/14/19 0530 05/14/19 0720  BP: (!) 82/43 (!) 90/53 (!) 83/57   Pulse: (!) 47 67 (!) 41 (!) 35  Resp: 18 18 19  (!) 23  Temp:    97.6 F (36.4 C)  TempSrc:    Axillary  SpO2: 95% 95% 90% 98%  Weight:  99 kg    Height:        Intake/Output Summary (Last 24 hours) at 05/14/2019 0939 Last data filed at 05/14/2019 0415 Gross per 24 hour  Intake 1001.59 ml  Output --  Net 1001.59 ml   Last 3 Weights 05/14/2019 05/13/2019 05/12/2019  Weight (lbs) 218 lb 4.1 oz 218 lb 4.1 oz 220 lb 7.4 oz  Weight (kg) 99 kg 99 kg 100 kg      Telemetry    afib 70s-100s - Personally  Reviewed  ECG    n/a - Personally Reviewed  Physical Exam   GEN: No acute distress.   Neck: No JVD Cardiac: irreg, tach, no murmurs, rubs, or gallops.  Respiratory: Clear to auscultation bilaterally. GI: Soft, nontender, non-distended  MS: No edema; No deformity. Neuro:  Nonfocal  Psych: Normal affect   Labs    High Sensitivity Troponin:   Recent Labs  Lab 05/12/19 0849 05/12/19 1140  TROPONINIHS 71* 68*      Chemistry Recent Labs  Lab 05/11/19 0403 05/12/19 0431 05/13/19 0402  NA 134* 134* 135  K 3.9 3.7 3.9  CL 95* 93* 94*  CO2 25 26 22   GLUCOSE 132* 138* 141*  BUN 34* 24* 37*  CREATININE 7.95* 5.85* 7.62*  CALCIUM 8.9 9.3 9.3  ALBUMIN  --   --  3.4*  GFRNONAA 5* 7* 5*  GFRAA 5* 8* 6*  ANIONGAP 14 15 19*     Hematology Recent Labs  Lab 05/10/19 0520 05/11/19 0403 05/13/19 0402  WBC 8.0 8.8 8.1  RBC 4.71 4.63 4.76  HGB 12.3 12.1 12.3  HCT 38.3 37.9 39.2  MCV 81.3 81.9 82.4  MCH 26.1 26.1 25.8*  MCHC 32.1 31.9 31.4  RDW 15.6* 15.8* 15.4  PLT 160 187 179  BNPNo results for input(s): BNP, PROBNP in the last 168 hours.   DDimer No results for input(s): DDIMER in the last 168 hours.   Radiology    No results found.  Cardiac Studies     Patient Profile     Alexis [name] is a 73 y.o. female with a hx of CAD and chronic systolic HFwho is being seen today for the evaluation of afib with RVR at the request of Dr Wynetta Emery  Assessment & Plan    1. Afib with RVR - new diagnosis this admission - rate control complicated by soft bp's. Yesterday we started amiodarone drip n setting of uncontrolled afib with RVR with soft bp's in patient with systolic HF and CAD, ESRD.  - started on eliquis - due to low bp's, we will d/c lopressor  -amio pauses last night due to low bp's. Of note she was on lopressor 25mg  bid yesterday - rates improved 70s-100s with short term amio yesterday. Low 100s today. Will see if able to tolerate oral amio 400mg  bid.     2. CAD - no active issues  3. Chronic systolic HF - fluid management per HD - does not appear volume overloaded - home hydral and imdur on hold due to soft bp's  4. Relative Hypotension - echo with stable LVEF 40-45%, mod RV dysfunction. NO clear acute cardiac etiology.  - normal WBC, afebrile. No clear signs of sepsis - her coreg and hydralazine are held.  - from renal notes I believe dry weight is 98.5 kg, she is 99 kg today.Would argue against hypovolemia - consider checking cortisol level - start midodrine 5mg  tid for now.    For questions or updates, please contact [place] Please consult [email or web address] for contact info under        Signed, Carlyle Dolly, MD  05/14/2019, 9:39 AM

## 2019-05-14 NOTE — Progress Notes (Signed)
[facility] Progress Note  Name: Alexis [name] MRN: [id number] DOB: 25-Mar-1946  Chief Complaint:  Elevated HR   Subjective:  She remains in ICU - was started on amio yesterday and this was paused for hypotension.  Last had HD on 12/26 with 1 kg UF.  No plans for TEE/DCCV at this time per cardiology note.  Blood pressure 92/62 on my exam.  Review of systems:  Denies shortness of breath  Denies n/v Denies chest pain  Doesn't think she has any extra fluid on ------- Background on consult:   Patient has a PMH significant for obesity, DM, HTN, combined diastolic and systolic CHF, and ESRD who presented to [facility] ED after HD due to persistent tachycardia.  In the ED she was noted to have new onset atrial fibrillation with RVR and relative hypotension.  She was admitted on 05/09/19 for rate control with cardizem drip and workup.  We were consulted to provide HD while she remains an inpatient.   Intake/Output Summary (Last 24 hours) at 05/14/2019 0807 Last data filed at 05/14/2019 0415 Gross per 24 hour  Intake 1001.59 ml  Output --  Net 1001.59 ml    Vitals:  Vitals:   05/14/19 0430 05/14/19 0500 05/14/19 0530 05/14/19 0720  BP: (!) 82/43 (!) 90/53 (!) 83/57   Pulse: (!) 47 67 (!) 41 (!) 35  Resp: 18 18 19  (!) 23  Temp:    97.6 F (36.4 C)  TempSrc:    Axillary  SpO2: 95% 95% 90% 98%  Weight:  99 kg    Height:         Physical Exam:  General adult female in bed in no acute distress HEENT normocephalic atraumatic extraocular movements intact sclera anicteric Neck supple trachea midline Lungs clear to auscultation bilaterally normal work of breathing at rest; room air Heart S1S2 no rub; no rub Abdomen soft nontender nondistended Extremities no edema appreciated Psych normal mood and affect RUE graft thrill/bruit   Medications reviewed    Labs:  BMP Latest Ref Rng & Units 05/13/2019 05/12/2019 05/11/2019  Glucose 70 - 99 mg/dL 141(H) 138(H) 132(H)  BUN 8  - 23 mg/dL 37(H) 24(H) 34(H)  Creatinine 0.44 - 1.00 mg/dL 7.62(H) 5.85(H) 7.95(H)  Sodium 135 - 145 mmol/L 135 134(L) 134(L)  Potassium 3.5 - 5.1 mmol/L 3.9 3.7 3.9  Chloride 98 - 111 mmol/L 94(L) 93(L) 95(L)  CO2 22 - 32 mmol/L 22 26 25   Calcium 8.9 - 10.3 mg/dL 9.3 9.3 8.9   Outpatient orders:  Center: [facility]  on TTS . EDW 98.5kg HD Bath 2K/2.5Ca  Time 4:15 Heparin 2000 units IVP then 1000/hr. Access RAVG BFR 400 DFR 600    Hectoral 4.5 mcg IV/HD Epogen 4000   Units IV/HD  Venofer  50 mg IV q Tuesday  Other sensipar 30 mg qtx   Assessment/Plan:   1.  Atrial fibrillation with RVR- new onset during HD on 05/09/19. Control per cardiology and primary team.  Note no current plans for TEE per cardiology note.  Off amio currently.    2.  ESRD -  HD per TTS schedule.  Ordered renal panel   3.  Hypertension/volume  - no overt overload; on room air- minimal UF with HD as tolerated   4.  Anemia  - no acute indication for ESA.  Hb above goal   5.  Metabolic bone disease -  Continue with outpatient meds; mild hyperphos   6.  Nutrition - renal diet  7. DM- per primary  8. Combined systolic and diastolic CHF- stable  Disposition- currently in ICU   Claudia Desanctis, MD 05/14/2019 8:07 AM\

## 2019-05-14 NOTE — Evaluation (Signed)
Physical Therapy Evaluation Patient Details Name: Alexis [name] MRN: [id number] DOB: 1945/05/18 Today's Date: 05/14/2019   History of Present Illness  Alexis [name] is a 73 y.o. female with medical history significant for obesity, ESRD on HD TTS, dyslipidemia, hypertension, type 2 diabetes, and CHF with LVEF 40-45% with grade 2 diastolic dysfunction who was brought to the ED today from her dialysis facility after she was noted to be tachycardic.  She was able to complete her entire 4-hour dialysis session without any difficulties and she was otherwise asymptomatic.  She denies any shortness of breath, chest pain, fever, chills, abdominal pain, nausea, or vomiting.  She takes her usual home medications as prescribed with no other difficulties.  She has no prior knowledge of having atrial fibrillation.    Clinical Impression  Patient limited for bed mobility mostly due to c/o increased pain with movement of LUE, once seated able to rock trunk and complete sit to stand with Min assist, demonstrates slow slightly labored cadence ambulating in room without loss of balance, limited secondary to c/o fatigue and tolerated sitting up in chair after therapy - RN notified.  Patient will benefit from continued physical therapy in hospital and recommended venue below to increase strength, balance, endurance for safe ADLs and gait.    Follow Up Recommendations SNF;Supervision for mobility/OOB;Supervision - Intermittent    Equipment Recommendations  None recommended by PT    Recommendations for Other Services       Precautions / Restrictions Precautions Precautions: Fall Restrictions Weight Bearing Restrictions: No      Mobility  Bed Mobility Overal bed mobility: Needs Assistance Bed Mobility: Supine to Sit     Supine to sit: Mod assist;Max assist     General bed mobility comments: slow labored movement, difficulty using BUE secondary to LUE pain/weakness  Transfers Overall transfer level:  Needs assistance Equipment used: Rolling walker (2 wheeled) Transfers: Sit to/from [facility] Sit to Stand: Min assist Stand pivot transfers: Min assist       General transfer comment: had place LUE onto RW due to increased pain, limited shoulder ROM  Ambulation/Gait Ambulation/Gait assistance: Min assist Gait Distance (Feet): 20 Feet Assistive device: Rolling walker (2 wheeled) Gait Pattern/deviations: Decreased step length - right;Decreased step length - left;Decreased stride length;Wide base of support Gait velocity: decreased   General Gait Details: slow slightly labored cadence, wide base of support, no loss of balance, limited secondary to c/o fatigue  Stairs            Wheelchair Mobility    Modified Rankin (Stroke Patients Only)       Balance Overall balance assessment: Needs assistance Sitting-balance support: Feet supported;No upper extremity supported Sitting balance-Leahy Scale: Fair Sitting balance - Comments: seated at EOB   Standing balance support: During functional activity;Bilateral upper extremity supported Standing balance-Leahy Scale: Fair Standing balance comment: using RW                             Pertinent Vitals/Pain Pain Assessment: Faces Faces Pain Scale: Hurts even more Pain Location: LUE with pressure movement Pain Descriptors / Indicators: Sore;Grimacing;Guarding;Discomfort Pain Intervention(s): Limited activity within patient's tolerance;Monitored during session    Home Living Family/patient expects to be discharged to:: Private residence Living Arrangements: Spouse/significant other Available Help at Discharge: Family;Available 24 hours/day Type of Home: House Home Access: Stairs to enter Entrance Stairs-Rails: Right;Left;Can reach both Entrance Stairs-Number of Steps: 2 Home Layout: One level Home  Equipment: Gilford Rile - 4 wheels;Walker - 2 wheels;Wheelchair - manual;Shower seat      Prior  Function Level of Independence: Independent with assistive device(s)         Comments: household ambulator with RW     Hand Dominance        Extremity/Trunk Assessment   Upper Extremity Assessment Upper Extremity Assessment: Generalized weakness;RUE deficits/detail;LUE deficits/detail RUE Deficits / Details: grossly 4+/5 RUE Sensation: WNL RUE Coordination: WNL LUE Deficits / Details: grossly -3/5, limited shoulder ROM 0-90 degrees due to increased pain LUE: Unable to fully assess due to pain LUE Sensation: WNL LUE Coordination: WNL    Lower Extremity Assessment Lower Extremity Assessment: Generalized weakness    Cervical / Trunk Assessment Cervical / Trunk Assessment: Normal  Communication   Communication: No difficulties  Cognition Arousal/Alertness: Awake/alert Behavior During Therapy: WFL for tasks assessed/performed Overall Cognitive Status: Within Functional Limits for tasks assessed                                        General Comments      Exercises     Assessment/Plan    PT Assessment Patient needs continued PT services  PT Problem List Decreased strength;Decreased activity tolerance;Decreased balance;Decreased mobility;Decreased range of motion;Pain       PT Treatment Interventions Gait training;Stair training;Functional mobility training;Therapeutic activities;Therapeutic exercise;Patient/family education    PT Goals (Current goals can be found in the Care Plan section)  Acute Rehab PT Goals Patient Stated Goal: return home with spouse to assist PT Goal Formulation: With patient Time For Goal Achievement: 05/28/19 Potential to Achieve Goals: Good    Frequency Min 3X/week   Barriers to discharge        Co-evaluation               AM-PAC PT "6 Clicks" Mobility  Outcome Measure Help needed turning from your back to your side while in a flat bed without using bedrails?: A Lot Help needed moving from lying on your  back to sitting on the side of a flat bed without using bedrails?: A Lot Help needed moving to and from a bed to a chair (including a wheelchair)?: A Little Help needed standing up from a chair using your arms (e.g., wheelchair or bedside chair)?: A Little Help needed to walk in hospital room?: A Little Help needed climbing 3-5 steps with a railing? : A Lot 6 Click Score: 15    End of Session   Activity Tolerance: Patient tolerated treatment well;Patient limited by fatigue Patient left: in chair;with call bell/phone within reach Nurse Communication: Mobility status PT Visit Diagnosis: Unsteadiness on feet (R26.81);Other abnormalities of gait and mobility (R26.89);Muscle weakness (generalized) (M62.81)    Time: 1610-9604 PT Time Calculation (min) (ACUTE ONLY): 31 min   Charges:   PT Evaluation $PT Eval Moderate Complexity: 1 Mod PT Treatments $Therapeutic Activity: 23-37 mins        11:05 AM, 05/14/19 Lonell Grandchild, MPT Physical Therapist with [facility] 336 [phone] office [phone] mobile phone

## 2019-05-14 NOTE — Progress Notes (Signed)
PROGRESS NOTE [facility]   Alexis [name]  ALP:[id number]  DOB: 07/19/1945  DOA: 05/09/2019 PCP: System, Provider Not In   Brief Admission Hx: 73 year old female with end-stage renal disease on HD TTS, hypertension, diabetes mellitus, coronary artery disease, stable angina, HFrEF, grade 2 diastolic dysfunction, dyslipidemia presented to the ED by EMS from her dialysis facility with tachycardia and noted to be in atrial fibrillation with RVR which was a new onset for her.  She was asymptomatic from this.  MDM/Assessment & Plan:   1. Atrial fibrillation with RVR-asymptomatic and initially required temporary Cardizem infusion and then subsequently started on oral metoprolol 25 mg twice a day.  Her BPs have been soft.  Metoprolol had to be discontinued but she has been started on oral amiodarone by cardiology today. The IV amiodarone was discontinued due to hypotension.  Her home hydralazine and Imdur has been held due to soft blood pressure readings.  2D echocardiogram has been performed with results noted below not a significant change from prior echo.   Her TSH has been within normal limits.  We will asked the cardiology team to help with managing heart rate.  She is on apixaban for full anticoagulation dosed by the Pharm.D.  Cardiology is following.   2. End-stage renal disease on HD TTS-patient had hemodialysis 05/11/2019 and tolerated it well.  Nephrology has been following her.  She is due to be treated 12/29 on her regular schedule. 3. Chest pain/angina-patient has a history of angina and complained of chest pain 12/27 but work up was unrevealing and likely was GI related, resolved with peptobismol x 1 dose.   EKG that was done with no acute findings.  High-sensitivity troponin with negative delta change.  She was given a sublingual nitroglycerin with no change in symptom.  I think her symptoms were GI related.  I do not suspect ACS.  Her chest x-ray and abdominal x-rays were personally  reviewed she does have abdominal gas that could be contributing to her symptoms.  Laxatives ordered. 4. Hypokalemia-this is been repleted. 5. Combined systolic diastolic CHF-her echocardiogram was repeated and EF is 40 to 45% severe hypokinesis of the left ventricular, basal-mid inferior wall, inferoseptal wall and inferolateral wall. 6. Essential hypertension-the patient has been having soft blood pressures which has made titrating the metoprolol more difficult.  We are holding her home Coreg Imdur and hydralazine at this time. 7. Dyslipidemia-she has been resumed on her home statin therapy. 8. GERD-she is having symptoms and I have added Protonix and gave her a one-time dose of Pepcid. 9. Type 2 diabetes mellitus-she has been resumed on her home insulin regimen and supplemental sliding scale coverage.  Continue to monitor CBGs.   DVT prophylaxis: Apixaban Code Status: Full Family Communication: spoke with her husband when he visited today Disposition Plan: Home when medically stabilized, AFib RVR is still uncontrolled.   Consultants:    Procedures: Echocardiogram IMPRESSIONS  1. Left ventricular ejection fraction, by visual estimation, is 40 to 45%. The left ventricle has moderately decreased function. There is moderately increased left ventricular hypertrophy.  2. Severe hypokinesis of the left ventricular, basal-mid inferior wall, inferoseptal wall and inferolateral wall.  3. Left ventricular diastolic function could not be evaluated.  4. The left ventricle demonstrates regional wall motion abnormalities.  5. Global right ventricle has moderately reduced systolic function.The right ventricular size is moderately enlarged. Right vetricular wall thickness was not assessed.  6. Left atrial size was severely dilated.  7. Right atrial size  was severely dilated.  8. The pericardial effusion is posterior to the left ventricle.  9. Trivial pericardial effusion is present. 10. Moderate  mitral annular calcification. 11. The mitral valve is degenerative. Mild to moderate mitral valve regurgitation. 12. The tricuspid valve is normal in structure. 13. The aortic valve is tricuspid. Aortic valve regurgitation is not visualized. Mild aortic valve sclerosis without stenosis. 14. The pulmonic valve was not well visualized. Pulmonic valve regurgitation is not visualized. 15. Mildly elevated pulmonary artery systolic pressure. 16. The inferior vena cava is normal in size with greater than 50% respiratory variability, suggesting right atrial pressure of 3 mmHg. 17. No significant change from prior study. 18. Prior images reviewed side by side.  Antimicrobials:  n/a   Subjective: Patient sitting up in chair today.  She is confused at times but has no specific complaints.   Objective: Vitals:   05/14/19 1500 05/14/19 1600 05/14/19 1612 05/14/19 1700  BP: (!) 96/34 (!) 112/57  (!) 105/38  Pulse: (!) 116 (!) 115 (!) 118 (!) 117  Resp: (!) 22 19 (!) 24 20  Temp:   98.6 F (37 C)   TempSrc:   Oral   SpO2: 97% 92% 97% 93%  Weight:      Height:        Intake/Output Summary (Last 24 hours) at 05/14/2019 1838 Last data filed at 05/14/2019 1700 Gross per 24 hour  Intake 1237.6 ml  Output --  Net 1237.6 ml   Filed Weights   05/12/19 0500 05/13/19 0500 05/14/19 0500  Weight: 100 kg 99 kg 99 kg   REVIEW OF SYSTEMS  As per history otherwise all reviewed and reported negative  Exam:  General exam: Chronically ill-appearing female sitting up in the bed she is awake and alert and does not appear to be in any distress. Respiratory system: Clear. No increased work of breathing. Cardiovascular system: irregularly irregular Normal S1 & S2 heard. No JVD, murmurs, gallops, clicks or pedal edema. Gastrointestinal system: Abdomen is nondistended, soft and mild epigastric tenderness with deep palpation but no guarding. Normal bowel sounds heard. Central nervous system: Alert and  oriented to person. No focal neurological deficits. Extremities: no CCE.  Data Reviewed: Basic Metabolic Panel: Recent Labs  Lab 05/09/19 1134 05/10/19 0520 05/11/19 0403 05/12/19 0431 05/13/19 0402 05/14/19 0948  NA 136 136 134* 134* 135 134*  K 3.0* 3.8 3.9 3.7 3.9 4.4  CL 97* 97* 95* 93* 94* 93*  CO2 26 25 25 26 22 22   GLUCOSE 83 143* 132* 138* 141* 158*  BUN 16 25* 34* 24* 37* 51*  CREATININE 4.35* 6.18* 7.95* 5.85* 7.62* 9.78*  CALCIUM 8.6* 8.8* 8.9 9.3 9.3 9.0  MG 1.8 1.8 2.0  --  2.1  --   PHOS  --   --   --   --  5.2* 6.3*   Liver Function Tests: Recent Labs  Lab 05/13/19 0402 05/14/19 0948  ALBUMIN 3.4* 3.3*   No results for input(s): LIPASE, AMYLASE in the last 168 hours. No results for input(s): AMMONIA in the last 168 hours. CBC: Recent Labs  Lab 05/09/19 1134 05/10/19 0520 05/11/19 0403 05/13/19 0402  WBC 8.5 8.0 8.8 8.1  HGB 11.8* 12.3 12.1 12.3  HCT 37.2 38.3 37.9 39.2  MCV 80.7 81.3 81.9 82.4  PLT 148* 160 187 179   Cardiac Enzymes: No results for input(s): CKTOTAL, CKMB, CKMBINDEX, TROPONINI in the last 168 hours. CBG (last 3)  Recent Labs    05/14/19 0719  05/14/19 1105 05/14/19 1611  GLUCAP 111* 153* 146*   Recent Results (from the past 240 hour(s))  SARS Coronavirus 2 by RT PCR (hospital order, performed in [place] hospital lab)     Status: None   Collection Time: 05/09/19  6:04 PM  Result Value Ref Range Status   SARS Coronavirus 2 NEGATIVE NEGATIVE Final    Comment: (NOTE) SARS-CoV-2 target nucleic acids are NOT DETECTED. The SARS-CoV-2 RNA is generally detectable in upper and lower respiratory specimens during the acute phase of infection. The lowest concentration of SARS-CoV-2 viral copies this assay can detect is 250 copies / mL. A negative result does not preclude SARS-CoV-2 infection and should not be used as the sole basis for treatment or other patient management decisions.  A negative result may occur with improper  specimen collection / handling, submission of specimen other than nasopharyngeal swab, presence of viral mutation(s) within the areas targeted by this assay, and inadequate number of viral copies (<250 copies / mL). A negative result must be combined with clinical observations, patient history, and epidemiological information. Fact Sheet for Patients:   StrictlyIdeas.no Fact Sheet for Healthcare Providers: BankingDealers.co.za This test is not yet approved or cleared  by the [place] FDA and has been authorized for detection and/or diagnosis of SARS-CoV-2 by FDA under an Emergency Use Authorization (EUA).  This EUA will remain in effect (meaning this test can be used) for the duration of the COVID-19 declaration under Section 564(b)(1) of the Act, 21 U.S.C. section 360bbb-3(b)(1), unless the authorization is terminated or revoked sooner. Performed at [facility], [address], [place], [place] [address]   MRSA PCR Screening     Status: None   Collection Time: 05/13/19  6:22 PM   Specimen: Nasal Mucosa; Nasopharyngeal  Result Value Ref Range Status   MRSA by PCR NEGATIVE NEGATIVE Final    Comment:        The GeneXpert MRSA Assay (FDA approved for NASAL specimens only), is one component of a comprehensive MRSA colonization surveillance program. It is not intended to diagnose MRSA infection nor to guide or monitor treatment for MRSA infections. Performed at [facility], [address], [place], [place] [address]      Studies: No results found.   Scheduled Meds: . amiodarone  400 mg Oral BID  . amitriptyline  25 mg Oral QHS  . apixaban  5 mg Oral BID  . atorvastatin  80 mg Oral Daily  . bisacodyl  10 mg Rectal Once  . calcium-vitamin D  1 tablet Oral Daily  . Chlorhexidine Gluconate Cloth  6 each Topical Q0600  . docusate sodium  100 mg Oral QODAY  . doxercalciferol  4.5 mcg Intravenous Q T,Th,Sa-HD  . ferric citrate   420 mg Oral TID WC  . insulin aspart  0-5 Units Subcutaneous QHS  . insulin aspart  0-6 Units Subcutaneous TID WC  . midodrine  5 mg Oral TID WC  . pantoprazole  40 mg Oral BID  . sevelamer carbonate  1,600 mg Oral TID WC  . sodium chloride flush  3 mL Intravenous Q12H   Continuous Infusions: . sodium chloride    . sodium chloride    . sodium chloride      Principal Problem:   Atrial fibrillation with RVR (HCC) Active Problems:   CHF (congestive heart failure) (HCC)   CAD (coronary artery disease)   Chest pain   ESRD (end stage renal disease) (HCC)   Dyspnea   ESRD  on hemodialysis ([place])   Hypokalemia   Hyperlipidemia   Pulmonary hypertension (HCC)   GERD (gastroesophageal reflux disease)   Stable angina [facility])  Critical Care Procedure Note Authorized and Performed by: Murvin Natal MD  Total Critical Care time:  33 minutes  Due to a high probability of clinically significant, life threatening deterioration, the patient required my highest level of preparedness to intervene emergently and I personally spent this critical care time directly and personally managing the patient.  This critical care time included obtaining a history; examining the patient, pulse oximetry; ordering and review of studies; arranging urgent treatment with development of a management plan; evaluation of patient's response of treatment; frequent reassessment; and discussions with other providers.  This critical care time was performed to assess and manage the high probability of imminent and life threatening deterioration that could result in multi-organ failure.  It was exclusive of separately billable procedures and treating other patients and teaching time.    Irwin Brakeman, MD [facility] 05/14/2019, 6:38 PM    LOS: 5 days  How to contact the [facility] Attending or Consulting provider [place] or covering provider during after hours [place], for this patient?  1. Check the care team in [facility] and look  for a) attending/consulting [facility] provider listed and b) the [facility] team listed 2. Log into [email or web address] and use [place]'s universal password to access. If you do not have the password, please contact the hospital operator. 3. Locate the [facility] provider you are looking for under [facility] and page to a number that you can be directly reached. 4. If you still have difficulty reaching the provider, please page the [facility] (Director on Call) for the Hospitalists listed on amion for assistance.

## 2019-05-14 NOTE — Progress Notes (Signed)
Patient is back in Afib but controlled in the 60-75 range. BP remains soft. MD notified of low blood pressure. Continue to monitor.

## 2019-05-15 LAB — RENAL FUNCTION PANEL
Albumin: 3.3 g/dL — ABNORMAL LOW (ref 3.5–5.0)
Anion gap: 18 — ABNORMAL HIGH (ref 5–15)
BUN: 24 mg/dL — ABNORMAL HIGH (ref 8–23)
CO2: 23 mmol/L (ref 22–32)
Calcium: 8.5 mg/dL — ABNORMAL LOW (ref 8.9–10.3)
Chloride: 93 mmol/L — ABNORMAL LOW (ref 98–111)
Creatinine, Ser: 5.67 mg/dL — ABNORMAL HIGH (ref 0.44–1.00)
GFR calc Af Amer: 8 mL/min — ABNORMAL LOW (ref >60)
GFR calc non Af Amer: 7 mL/min — ABNORMAL LOW (ref >60)
Glucose, Bld: 118 mg/dL — ABNORMAL HIGH (ref 70–99)
Phosphorus: 3.7 mg/dL (ref 2.5–4.6)
Potassium: 3.8 mmol/L (ref 3.5–5.1)
Sodium: 134 mmol/L — ABNORMAL LOW (ref 135–145)

## 2019-05-15 LAB — CBC
HCT: 37 % (ref 36.0–46.0)
Hemoglobin: 11.8 g/dL — ABNORMAL LOW (ref 12.0–15.0)
MCH: 26.1 pg (ref 26.0–34.0)
MCHC: 31.9 g/dL (ref 30.0–36.0)
MCV: 81.9 fL (ref 80.0–100.0)
Platelets: 167 10*3/uL (ref 150–400)
RBC: 4.52 MIL/uL (ref 3.87–5.11)
RDW: 15.6 % — ABNORMAL HIGH (ref 11.5–15.5)
WBC: 11.4 10*3/uL — ABNORMAL HIGH (ref 4.0–10.5)
nRBC: 0 % (ref 0.0–0.2)

## 2019-05-15 LAB — GLUCOSE, CAPILLARY: Glucose-Capillary: 116 mg/dL — ABNORMAL HIGH (ref 70–99)

## 2019-05-15 MED ORDER — MIDODRINE HCL 5 MG PO TABS: 5.0000 mg | ORAL_TABLET | Freq: Three times a day (TID) | ORAL | 1 refills | Status: DC

## 2019-05-15 MED ORDER — APIXABAN 5 MG PO TABS: 5.0000 mg | ORAL_TABLET | Freq: Two times a day (BID) | ORAL | 1 refills | Status: DC

## 2019-05-15 MED ORDER — AMIODARONE HCL 200 MG PO TABS: ORAL_TABLET | ORAL | 1 refills | Status: DC

## 2019-05-15 NOTE — Discharge Summary (Signed)
Physician Discharge Summary  Alexis [name] WYO:[id number] DOB: February 23, 1946 DOA: 05/09/2019  PCP: System, Provider Not In  Admit date: 05/09/2019 Discharge date: 05/15/2019  Admitted From:  HOME  Disposition:  HOME   Recommendations for Outpatient Follow-up:  1. Follow up with PCP in 1 weeks 2. Follow up with cardiology in 2 weeks 3. amio instructions given: 400mg  bid x 1 week, then 200mg  bid for 2 weeks, then 200mg  daily  Home Health: PT RN  Discharge Condition: STABLE  CODE STATUS: FULL    Brief Hospitalization Summary: Please see all hospital notes, images, labs for full details of the hospitalization. ADMISSION HPI: Alexis [name] is a 73 y.o. female with medical history significant for obesity, ESRD on HD TTS, dyslipidemia, hypertension, type 2 diabetes, and CHF with LVEF 40-45% with grade 2 diastolic dysfunction who was brought to the ED today from her dialysis facility after she was noted to be tachycardic.  She was able to complete her entire 4-hour dialysis session without any difficulties and she was otherwise asymptomatic.  She denies any shortness of breath, chest pain, fever, chills, abdominal pain, nausea, or vomiting.  She takes her usual home medications as prescribed with no other difficulties.  She has no prior knowledge of having atrial fibrillation.   ED Course: Vital signs demonstrate tachycardia with atrial fibrillation on telemetry.  No fever or other abnormalities noted.  Patient was given oral metoprolol without any response and will be started on Cardizem bolus and drip.  She is currently on room air with COVID-19 study pending.  TSH within normal limits.  Her potassium is 3 and platelets are 148.  Brief Admission Hx: 73 year old female with end-stage renal disease on HD TTS, hypertension, diabetes mellitus, coronary artery disease, stable angina, HFrEF, grade 2 diastolic dysfunction, dyslipidemia presented to the ED by EMS from her dialysis facility with  tachycardia and noted to be in atrial fibrillation with RVR which was a new onset for her.  She was asymptomatic from this.  MDM/Assessment & Plan:   1. Atrial fibrillation with RVR-asymptomatic and initially required temporary Cardizem infusion and then subsequently started on oral metoprolol 25 mg twice a day but discontinued.  Her BPs have been soft.  Metoprolol had to be discontinued due to soft BPs but she has been started on oral amiodarone by cardiology and has converted to sinus rhythm. The IV amiodarone was discontinued due to hypotension.  Her home hydralazine and Imdur has been discontinued due to soft blood pressure readings.  2D echocardiogram has been performed with results noted below not a significant change from prior echo.   Her TSH has been within normal limits.  She is on apixaban for full anticoagulation dosed by the Pharm.D.  Her rate is controlled. Cardiology recommended continuing oral amiodarone 400 mg BID x 1 week, 200 mg BID x 2 weeks, then 200 mg daily, Instructions given to patient and husband who verbalized understanding.    2. End-stage renal disease on HD TTS-patient had hemodialysis 05/11/2019 and 12/29 and tolerated it well.  Nephrology has been following her.  Resume  her regular schedule outpatient. 3. Chest pain/angina-patient has a history of angina and complained of chest pain 12/27 but work up was unrevealing and likely was GI related, resolved with peptobismol x 1 dose.   EKG that was done with no acute findings.  High-sensitivity troponin with negative delta change.  She was given a sublingual nitroglycerin with no change in symptom.  I think her symptoms were GI  related.  I do not suspect ACS.  Her chest x-ray and abdominal x-rays were personally reviewed she does have abdominal gas that could be contributing to her symptoms.  Laxatives ordered. 4. Hypokalemia-this is been repleted. 5. Combined systolic diastolic CHF-her echocardiogram was repeated and EF is 40 to  45% severe hypokinesis of the left ventricular, basal-mid inferior wall, inferoseptal wall and inferolateral wall. 6. Essential hypertension-the patient has been having soft blood pressures and has been started on midodrine with good results and will continue at discharge. 7. Dyslipidemia-she has been resumed on her home statin therapy. 8. GERD-she is having symptoms and I have added Protonix and gave her a one-time dose of Pepcid. 9. Type 2 diabetes mellitus-she has not required much insulin and 70/30 was discontined due to high risk for severe hypoglycemia. Follow up with PCP but would not recommend insulin at this time based on blood sugars being consistently less than 140 in hospital for several days of monitoring.   DVT prophylaxis: Apixaban Code Status: Full Family Communication: spoke with her husband  Disposition Plan: Home   Consultants:    Procedures: Echocardiogram IMPRESSIONS 1. Left ventricular ejection fraction, by visual estimation, is 40 to 45%. The left ventricle has moderately decreased function. There is moderately increased left ventricular hypertrophy. 2. Severe hypokinesis of the left ventricular, basal-mid inferior wall, inferoseptal wall and inferolateral wall. 3. Left ventricular diastolic function could not be evaluated. 4. The left ventricle demonstrates regional wall motion abnormalities. 5. Global right ventricle has moderately reduced systolic function.The right ventricular size is moderately enlarged. Right vetricular wall thickness was not assessed. 6. Left atrial size was severely dilated. 7. Right atrial size was severely dilated. 8. The pericardial effusion is posterior to the left ventricle. 9. Trivial pericardial effusion is present. 10. Moderate mitral annular calcification. 11. The mitral valve is degenerative. Mild to moderate mitral valve regurgitation. 12. The tricuspid valve is normal in structure. 13. The aortic valve is  tricuspid. Aortic valve regurgitation is not visualized. Mild aortic valve sclerosis without stenosis. 14. The pulmonic valve was not well visualized. Pulmonic valve regurgitation is not visualized. 15. Mildly elevated pulmonary artery systolic pressure. 16. The inferior vena cava is normal in size with greater than 50% respiratory variability, suggesting right atrial pressure of 3 mmHg. 17. No significant change from prior study. 18. Prior images reviewed side by side.  Antimicrobials:  n/a    Discharge Diagnoses:  Principal Problem:   Atrial fibrillation with rapid ventricular response (HCC) Active Problems:   CHF (congestive heart failure) (HCC)   CAD (coronary artery disease)   Chest pain   ESRD (end stage renal disease) (HCC)   Dyspnea   ESRD on hemodialysis (HCC)   Hypokalemia   Hyperlipidemia   Pulmonary hypertension (HCC)   GERD (gastroesophageal reflux disease)   Stable angina [facility])   Discharge Instructions: Discharge Instructions    Amb referral to [facility]   Complete by: As directed      Allergies as of 05/15/2019      Reactions   Olmesartan Other (See Comments)   Hyperkalemia      Medication List    STOP taking these medications   aspirin 81 MG EC tablet   carvedilol 6.25 MG tablet Commonly known as: COREG   hydrALAZINE 100 MG tablet Commonly known as: APRESOLINE   HYDROcodone-acetaminophen 5-325 MG tablet Commonly known as: NORCO/VICODIN   isosorbide mononitrate 30 MG 24 hr tablet Commonly known as: IMDUR   NovoLOG Mix 70/30 FlexPen (70-30)  100 UNIT/ML FlexPen Generic drug: insulin aspart protamine - aspart   oxyCODONE-acetaminophen 5-325 MG tablet Commonly known as: Percocet     TAKE these medications   acetaminophen 325 MG tablet Commonly known as: TYLENOL Take 650 mg by mouth every 6 (six) hours as needed for moderate pain.   albuterol 108 (90 Base) MCG/ACT inhaler Commonly known as: VENTOLIN HFA Inhale 1-2 puffs into the  lungs every 6 (six) hours as needed for wheezing or shortness of breath.   amiodarone 200 MG tablet Commonly known as: PACERONE 2 po bid x 1 week, then 1 po BID x 2 weeks, then 1 po daily   amitriptyline 25 MG tablet Commonly known as: ELAVIL Take 25 mg by mouth at bedtime.   apixaban 5 MG Tabs tablet Commonly known as: ELIQUIS Take 1 tablet (5 mg total) by mouth 2 (two) times daily.   atorvastatin 80 MG tablet Commonly known as: LIPITOR Take 80 mg by mouth daily.   Auryxia 1 GM 210 MG(Fe) tablet Generic drug: ferric citrate Take 420 mg by mouth 3 (three) times daily with meals. Take 2 tablets (420 mg) by mouth 3 times daily with meals   calcitRIOL 0.25 MCG capsule Commonly known as: ROCALTROL Take 0.25 mcg by mouth 3 (three) times a week. On Dialysis days Tues, Thur, Sat.   Calcium-Vitamin D-Vitamin K 500-500-40 MG-UNT-MCG Chew Chew 1 tablet by mouth daily.   diclofenac sodium 1 % Gel Commonly known as: VOLTAREN Apply 1 application topically 2 (two) times daily as needed (pain.).   docusate sodium 100 MG capsule Commonly known as: COLACE Take 100 mg by mouth every other day.   gabapentin 100 MG capsule Commonly known as: NEURONTIN Take 200 mg by mouth at bedtime as needed (pain).   lidocaine-prilocaine cream Commonly known as: EMLA Apply 1 application topically daily as needed (prior to port being accessed).   midodrine 5 MG tablet Commonly known as: PROAMATINE Take 1 tablet (5 mg total) by mouth 3 (three) times daily with meals.   nitroGLYCERIN 0.4 MG SL tablet Commonly known as: NITROSTAT Place 0.4 mg under the tongue every 5 (five) minutes as needed for chest pain.   polyethylene glycol 17 g packet Commonly known as: MIRALAX / GLYCOLAX Take 17 g by mouth daily as needed for mild constipation.   sevelamer carbonate 800 MG tablet Commonly known as: RENVELA Take 1,600 mg by mouth 3 (three) times daily with meals.   tiZANidine 4 MG tablet Commonly known  as: ZANAFLEX Take 4 mg by mouth at bedtime as needed for muscle spasms.   triamcinolone cream 0.5 % Commonly known as: KENALOG Apply 1 application topically 2 (two) times daily as needed (rash).      Follow-up Information    [facility] Follow up.   Why: home health RN and PT Contact information: [address] [place]       Primary care physician. Schedule an appointment as soon as possible for a visit in 1 week(s).   Why: Hospital Follow Up         Allergies  Allergen Reactions  . Olmesartan Other (See Comments)    Hyperkalemia    Allergies as of 05/15/2019      Reactions   Olmesartan Other (See Comments)   Hyperkalemia      Medication List    STOP taking these medications   aspirin 81 MG EC tablet   carvedilol 6.25 MG tablet Commonly known as: COREG  hydrALAZINE 100 MG tablet Commonly known as: APRESOLINE   HYDROcodone-acetaminophen 5-325 MG tablet Commonly known as: NORCO/VICODIN   isosorbide mononitrate 30 MG 24 hr tablet Commonly known as: IMDUR   NovoLOG Mix 70/30 FlexPen (70-30) 100 UNIT/ML FlexPen Generic drug: insulin aspart protamine - aspart   oxyCODONE-acetaminophen 5-325 MG tablet Commonly known as: Percocet     TAKE these medications   acetaminophen 325 MG tablet Commonly known as: TYLENOL Take 650 mg by mouth every 6 (six) hours as needed for moderate pain.   albuterol 108 (90 Base) MCG/ACT inhaler Commonly known as: VENTOLIN HFA Inhale 1-2 puffs into the lungs every 6 (six) hours as needed for wheezing or shortness of breath.   amiodarone 200 MG tablet Commonly known as: PACERONE 2 po bid x 1 week, then 1 po BID x 2 weeks, then 1 po daily   amitriptyline 25 MG tablet Commonly known as: ELAVIL Take 25 mg by mouth at bedtime.   apixaban 5 MG Tabs tablet Commonly known as: ELIQUIS Take 1 tablet (5 mg total) by mouth 2 (two) times daily.   atorvastatin 80 MG  tablet Commonly known as: LIPITOR Take 80 mg by mouth daily.   Auryxia 1 GM 210 MG(Fe) tablet Generic drug: ferric citrate Take 420 mg by mouth 3 (three) times daily with meals. Take 2 tablets (420 mg) by mouth 3 times daily with meals   calcitRIOL 0.25 MCG capsule Commonly known as: ROCALTROL Take 0.25 mcg by mouth 3 (three) times a week. On Dialysis days Tues, Thur, Sat.   Calcium-Vitamin D-Vitamin K 500-500-40 MG-UNT-MCG Chew Chew 1 tablet by mouth daily.   diclofenac sodium 1 % Gel Commonly known as: VOLTAREN Apply 1 application topically 2 (two) times daily as needed (pain.).   docusate sodium 100 MG capsule Commonly known as: COLACE Take 100 mg by mouth every other day.   gabapentin 100 MG capsule Commonly known as: NEURONTIN Take 200 mg by mouth at bedtime as needed (pain).   lidocaine-prilocaine cream Commonly known as: EMLA Apply 1 application topically daily as needed (prior to port being accessed).   midodrine 5 MG tablet Commonly known as: PROAMATINE Take 1 tablet (5 mg total) by mouth 3 (three) times daily with meals.   nitroGLYCERIN 0.4 MG SL tablet Commonly known as: NITROSTAT Place 0.4 mg under the tongue every 5 (five) minutes as needed for chest pain.   polyethylene glycol 17 g packet Commonly known as: MIRALAX / GLYCOLAX Take 17 g by mouth daily as needed for mild constipation.   sevelamer carbonate 800 MG tablet Commonly known as: RENVELA Take 1,600 mg by mouth 3 (three) times daily with meals.   tiZANidine 4 MG tablet Commonly known as: ZANAFLEX Take 4 mg by mouth at bedtime as needed for muscle spasms.   triamcinolone cream 0.5 % Commonly known as: KENALOG Apply 1 application topically 2 (two) times daily as needed (rash).       Procedures/Studies: DG CHEST PORT 1 VIEW  Result Date: 05/12/2019 CLINICAL DATA:  Tachycardia. End-stage renal disease. EXAM: PORTABLE CHEST 1 VIEW COMPARISON:  May 09, 2019 FINDINGS: The mediastinal  contour is normal. The heart size is enlarged. The lungs are clear. There is no focal infiltrate, pulmonary edema, or pleural effusion. Soft tissues and osseous structures are stable. IMPRESSION: No active cardiopulmonary disease. Electronically Signed   By: Abelardo Diesel M.D.   On: 05/12/2019 09:06   DG Chest Port 1 View  Result Date: 05/09/2019 CLINICAL DATA:  Acute onset  of atrial fibrillation after hemodialysis earlier today. EXAM: PORTABLE CHEST 1 VIEW COMPARISON:  09/07/2018 and earlier. FINDINGS: Cardiac silhouette moderately to markedly enlarged, unchanged. Pulmonary vascularity normal. Lungs clear. No pleural effusions. Stable chronic mild elevation of the RIGHT hemidiaphragm. IMPRESSION: 1. Stable moderate to marked cardiomegaly. No acute cardiopulmonary disease. 2. Stable chronic elevation of the RIGHT hemidiaphragm. Electronically Signed   By: Evangeline Dakin M.D.   On: 05/09/2019 11:23   DG Abd Portable 1V  Result Date: 05/12/2019 CLINICAL DATA:  No indication provided. EXAM: PORTABLE ABDOMEN - 1 VIEW COMPARISON:  December 26, 2018 FINDINGS: The bowel gas pattern is normal. No radio-opaque calculi or other significant radiographic abnormality are seen. IMPRESSION: No acute abdominal findings. Electronically Signed   By: Abelardo Diesel M.D.   On: 05/12/2019 09:08   ECHOCARDIOGRAM COMPLETE  Result Date: 05/12/2019   ECHOCARDIOGRAM REPORT   Patient Name:   Alexis [name] Date of Exam: 05/09/2019 Medical Rec #:  [id number]      Height:       66.0 in Accession #:    [id number]     Weight:       274.0 lb Date of Birth:  1945/10/18      BSA:          2.29 m Patient Age:    28 years       BP:           113/64 mmHg Patient Gender: F              HR:           125 bpm. Exam Location:  [facility] Procedure: 2D Echo Indications:    Atrial Fibrillation 427.31 / I48.91  History:        Patient has prior history of Echocardiogram examinations, most                 recent 08/05/2017. CHF, CAD,  Arrythmias:Atrial Fibrillation,                 Signs/Symptoms:Chest Pain; Risk Factors:Hypertension and                 Diabetes. ESRD, Elevated troponin.  Sonographer:    Leavy Cella RDCS (AE) Referring Phys: [id number] Royanne Foots Herlong  1. Left ventricular ejection fraction, by visual estimation, is 40 to 45%. The left ventricle has moderately decreased function. There is moderately increased left ventricular hypertrophy.  2. Severe hypokinesis of the left ventricular, basal-mid inferior wall, inferoseptal wall and inferolateral wall.  3. Left ventricular diastolic function could not be evaluated.  4. The left ventricle demonstrates regional wall motion abnormalities.  5. Global right ventricle has moderately reduced systolic function.The right ventricular size is moderately enlarged. Right vetricular wall thickness was not assessed.  6. Left atrial size was severely dilated.  7. Right atrial size was severely dilated.  8. The pericardial effusion is posterior to the left ventricle.  9. Trivial pericardial effusion is present. 10. Moderate mitral annular calcification. 11. The mitral valve is degenerative. Mild to moderate mitral valve regurgitation. 12. The tricuspid valve is normal in structure. 13. The aortic valve is tricuspid. Aortic valve regurgitation is not visualized. Mild aortic valve sclerosis without stenosis. 14. The pulmonic valve was not well visualized. Pulmonic valve regurgitation is not visualized. 15. Mildly elevated pulmonary artery systolic pressure. 16. The inferior vena cava is normal in size with greater than 50% respiratory variability, suggesting right atrial pressure of 3 mmHg. 17. No significant  change from prior study. 18. Prior images reviewed side by side. FINDINGS  Left Ventricle: Left ventricular ejection fraction, by visual estimation, is 40 to 45%. The left ventricle has moderately decreased function. Severe hypokinesis of the left ventricular, basal-mid inferior wall,  inferoseptal wall and inferolateral wall. The left ventricle demonstrates regional wall motion abnormalities. The left ventricular internal cavity size was the left ventricle is normal in size. There is moderately increased left ventricular hypertrophy. Concentric left ventricular hypertrophy. The left ventricular diastology could not be evaluated due to atrial fibrillation. Left ventricular diastolic function could not be evaluated. Right Ventricle: The right ventricular size is moderately enlarged. Right vetricular wall thickness was not assessed. Global RV systolic function is has moderately reduced systolic function. The tricuspid regurgitant velocity is 2.62 m/s, and with an assumed right atrial pressure of 10 mmHg, the estimated right ventricular systolic pressure is mildly elevated at 37.5 mmHg. Left Atrium: Left atrial size was severely dilated. Right Atrium: Right atrial size was severely dilated Pericardium: Trivial pericardial effusion is present. The pericardial effusion is posterior to the left ventricle. Mitral Valve: The mitral valve is degenerative in appearance. Moderate mitral annular calcification. Mild to moderate mitral valve regurgitation, with centrally-directed jet. Tricuspid Valve: The tricuspid valve is normal in structure. Tricuspid valve regurgitation is trivial. Aortic Valve: The aortic valve is tricuspid. Aortic valve regurgitation is not visualized. Mild aortic valve sclerosis is present, with no evidence of aortic valve stenosis. Pulmonic Valve: The pulmonic valve was not well visualized. Pulmonic valve regurgitation is not visualized. Pulmonic regurgitation is not visualized. Aorta: The aortic root is normal in size and structure. Venous: The inferior vena cava is normal in size with greater than 50% respiratory variability, suggesting right atrial pressure of 3 mmHg. IAS/Shunts: No atrial level shunt detected by color flow Doppler.  LEFT VENTRICLE PLAX 2D LVIDd:         4.81 cm   Diastology LVIDs:         3.97 cm  LV e' lateral:   4.05 cm/s LV PW:         1.42 cm  LV E/e' lateral: 32.6 LV IVS:        1.46 cm  LV e' medial:    4.87 cm/s LVOT diam:     1.90 cm  LV E/e' medial:  27.1 LV SV:         39 ml LV SV Index:   15.89 LVOT Area:     2.84 cm  RIGHT VENTRICLE RV S prime:     5.43 cm/s TAPSE (M-mode): 0.9 cm LEFT ATRIUM              Index       RIGHT ATRIUM           Index LA diam:        4.90 cm  2.14 cm/m  RA Area:     26.80 cm LA Vol (A2C):   80.3 ml  35.13 ml/m RA Volume:   89.50 ml  39.15 ml/m LA Vol (A4C):   100.0 ml 43.74 ml/m LA Biplane Vol: 92.9 ml  40.64 ml/m   AORTA Ao Root diam: 2.70 cm MITRAL VALVE                        TRICUSPID VALVE MV Area (PHT): 5.38 cm             TR Peak grad:   27.5 mmHg MV PHT:  40.89 msec           TR Vmax:        266.00 cm/s MV Decel Time: 141 msec MR Peak grad: 66.3 mmHg             SHUNTS MR Mean grad: 44.0 mmHg             Systemic Diam: 1.90 cm MR Vmax:      407.00 cm/s MR Vmean:     312.0 cm/s MV E velocity: 132.00 cm/s 103 cm/s  Dani Gobble Croitoru MD Electronically signed by Sanda Klein MD Signature Date/Time: 05/12/2019/11:42:51 AM    Final       Subjective: Pt reports that she feels well. She really wants to go home.    Discharge Exam: Vitals:   05/15/19 0900 05/15/19 1000  BP: (!) 124/44 104/67  Pulse: 88 84  Resp: 19 19  Temp:    SpO2: 95% 93%   Vitals:   05/15/19 0600 05/15/19 0800 05/15/19 0900 05/15/19 1000  BP: (!) 159/124 (!) 120/55 (!) 124/44 104/67  Pulse: 67 84 88 84  Resp: 20 17 19 19   Temp:  97.7 F (36.5 C)    TempSrc:  Oral    SpO2: 92% 92% 95% 93%  Weight:      Height:       General exam: Chronically ill-appearing female sitting up in the bed she is awake and alert and does not appear to be in any distress. Respiratory system: Clear. No increased work of breathing. Cardiovascular system: irregularly irregular Normal S1 & S2 heard. No JVD, murmurs, gallops, clicks or pedal  edema. Gastrointestinal system: Abdomen is nondistended, soft and mild epigastric tenderness with deep palpation but no guarding. Normal bowel sounds heard. Central nervous system: Alert and oriented to person. No focal neurological deficits. Extremities: no CCE.   The results of significant diagnostics from this hospitalization (including imaging, microbiology, ancillary and laboratory) are listed below for reference.     Microbiology: Recent Results (from the past 240 hour(s))  SARS Coronavirus 2 by RT PCR (hospital order, performed in [facility] hospital lab)     Status: None   Collection Time: 05/09/19  6:04 PM  Result Value Ref Range Status   SARS Coronavirus 2 NEGATIVE NEGATIVE Final    Comment: (NOTE) SARS-CoV-2 target nucleic acids are NOT DETECTED. The SARS-CoV-2 RNA is generally detectable in upper and lower respiratory specimens during the acute phase of infection. The lowest concentration of SARS-CoV-2 viral copies this assay can detect is 250 copies / mL. A negative result does not preclude SARS-CoV-2 infection and should not be used as the sole basis for treatment or other patient management decisions.  A negative result may occur with improper specimen collection / handling, submission of specimen other than nasopharyngeal swab, presence of viral mutation(s) within the areas targeted by this assay, and inadequate number of viral copies (<250 copies / mL). A negative result must be combined with clinical observations, patient history, and epidemiological information. Fact Sheet for Patients:   StrictlyIdeas.no Fact Sheet for Healthcare Providers: BankingDealers.co.za This test is not yet approved or cleared  by the [place] FDA and has been authorized for detection and/or diagnosis of SARS-CoV-2 by FDA under an Emergency Use Authorization (EUA).  This EUA will remain in effect (meaning this test can be used) for the  duration of the COVID-19 declaration under Section 564(b)(1) of the Act, 21 U.S.C. section 360bbb-3(b)(1), unless the authorization is terminated or revoked sooner. Performed  at [facility], [address], [place], [place] [address]   MRSA PCR Screening     Status: None   Collection Time: 05/13/19  6:22 PM   Specimen: Nasal Mucosa; Nasopharyngeal  Result Value Ref Range Status   MRSA by PCR NEGATIVE NEGATIVE Final    Comment:        The GeneXpert MRSA Assay (FDA approved for NASAL specimens only), is one component of a comprehensive MRSA colonization surveillance program. It is not intended to diagnose MRSA infection nor to guide or monitor treatment for MRSA infections. Performed at [facility], [address], [place], [place] [address]      Labs: BNP (last 3 results) No results for input(s): BNP in the last 8760 hours. Basic Metabolic Panel: Recent Labs  Lab 05/09/19 1134 05/10/19 0520 05/11/19 0403 05/12/19 0431 05/13/19 0402 05/14/19 0948 05/15/19 0622  NA 136 136 134* 134* 135 134* 134*  K 3.0* 3.8 3.9 3.7 3.9 4.4 3.8  CL 97* 97* 95* 93* 94* 93* 93*  CO2 26 25 25 26 22 22 23   GLUCOSE 83 143* 132* 138* 141* 158* 118*  BUN 16 25* 34* 24* 37* 51* 24*  CREATININE 4.35* 6.18* 7.95* 5.85* 7.62* 9.78* 5.67*  CALCIUM 8.6* 8.8* 8.9 9.3 9.3 9.0 8.5*  MG 1.8 1.8 2.0  --  2.1  --   --   PHOS  --   --   --   --  5.2* 6.3* 3.7   Liver Function Tests: Recent Labs  Lab 05/13/19 0402 05/14/19 0948 05/15/19 0622  ALBUMIN 3.4* 3.3* 3.3*   No results for input(s): LIPASE, AMYLASE in the last 168 hours. No results for input(s): AMMONIA in the last 168 hours. CBC: Recent Labs  Lab 05/09/19 1134 05/10/19 0520 05/11/19 0403 05/13/19 0402 05/15/19 0622  WBC 8.5 8.0 8.8 8.1 11.4*  HGB 11.8* 12.3 12.1 12.3 11.8*  HCT 37.2 38.3 37.9 39.2 37.0  MCV 80.7 81.3 81.9 82.4 81.9  PLT 148* 160 187 179 167   Cardiac Enzymes: No results for input(s): CKTOTAL, CKMB,  CKMBINDEX, TROPONINI in the last 168 hours. BNP: Invalid input(s): POCBNP CBG: Recent Labs  Lab 05/14/19 0719 05/14/19 1105 05/14/19 1611 05/14/19 2123 05/15/19 0808  GLUCAP 111* 153* 146* 108* 116*   D-Dimer No results for input(s): DDIMER in the last 72 hours. Hgb A1c No results for input(s): HGBA1C in the last 72 hours. Lipid Profile No results for input(s): CHOL, HDL, LDLCALC, TRIG, CHOLHDL, LDLDIRECT in the last 72 hours. Thyroid function studies No results for input(s): TSH, T4TOTAL, T3FREE, THYROIDAB in the last 72 hours.  Invalid input(s): FREET3 Anemia work up No results for input(s): VITAMINB12, FOLATE, FERRITIN, TIBC, IRON, RETICCTPCT in the last 72 hours. Urinalysis    Component Value Date/Time   COLORURINE YELLOW 08/19/2012 1126   APPEARANCEUR CLOUDY (A) 08/19/2012 1126   LABSPEC 1.018 08/19/2012 1126   PHURINE 5.0 08/19/2012 1126   GLUCOSEU NEGATIVE 08/19/2012 1126   HGBUR SMALL (A) 08/19/2012 1126   [place] 08/19/2012 [place] 08/19/2012 1126   PROTEINUR >300 (A) 08/19/2012 1126   UROBILINOGEN 0.2 08/19/2012 1126   NITRITE NEGATIVE 08/19/2012 1126   LEUKOCYTESUR SMALL (A) 08/19/2012 1126   Sepsis Labs Invalid input(s): PROCALCITONIN,  WBC,  LACTICIDVEN Microbiology Recent Results (from the past 240 hour(s))  SARS Coronavirus 2 by RT PCR (hospital order, performed in [facility] hospital lab)     Status: None   Collection Time: 05/09/19  6:04  PM  Result Value Ref Range Status   SARS Coronavirus 2 NEGATIVE NEGATIVE Final    Comment: (NOTE) SARS-CoV-2 target nucleic acids are NOT DETECTED. The SARS-CoV-2 RNA is generally detectable in upper and lower respiratory specimens during the acute phase of infection. The lowest concentration of SARS-CoV-2 viral copies this assay can detect is 250 copies / mL. A negative result does not preclude SARS-CoV-2 infection and should not be used as the sole basis for treatment or  other patient management decisions.  A negative result may occur with improper specimen collection / handling, submission of specimen other than nasopharyngeal swab, presence of viral mutation(s) within the areas targeted by this assay, and inadequate number of viral copies (<250 copies / mL). A negative result must be combined with clinical observations, patient history, and epidemiological information. Fact Sheet for Patients:   StrictlyIdeas.no Fact Sheet for Healthcare Providers: BankingDealers.co.za This test is not yet approved or cleared  by the [place] FDA and has been authorized for detection and/or diagnosis of SARS-CoV-2 by FDA under an Emergency Use Authorization (EUA).  This EUA will remain in effect (meaning this test can be used) for the duration of the COVID-19 declaration under Section 564(b)(1) of the Act, 21 U.S.C. section 360bbb-3(b)(1), unless the authorization is terminated or revoked sooner. Performed at [facility], [address], [place], [place] [address]   MRSA PCR Screening     Status: None   Collection Time: 05/13/19  6:22 PM   Specimen: Nasal Mucosa; Nasopharyngeal  Result Value Ref Range Status   MRSA by PCR NEGATIVE NEGATIVE Final    Comment:        The GeneXpert MRSA Assay (FDA approved for NASAL specimens only), is one component of a comprehensive MRSA colonization surveillance program. It is not intended to diagnose MRSA infection nor to guide or monitor treatment for MRSA infections. Performed at [facility], [address] [place], [place] [address]    Time coordinating discharge: 34 mins  SIGNED:  Irwin Brakeman, MD  [facility] 05/15/2019, 11:31 AM How to contact the [facility] Attending or Consulting provider [place] or covering provider during after hours [place], for this patient?  1. Check the care team in [facility] and look for a) attending/consulting [facility] provider listed and b)  the [facility] team listed 2. Log into [email or web address] and use [facility] universal password to access. If you do not have the password, please contact the hospital operator. 3. Locate the [facility] provider you are looking for under [facility] and page to a number that you can be directly reached. 4. If you still have difficulty reaching the provider, please page the [facility] (Director on Call) for the Hospitalists listed on amion for assistance.

## 2019-05-15 NOTE — Discharge Instructions (Signed)
AMIODARONE INSTRUCTIONS: TAKE 2 TABS TWICE DAILY FOR 1 WEEK, THEN 1 TAB TWICE DAILY FOR 2 WEEKS, THEN 1 TAB DAILY AFTER THAT      Information on my medicine - ELIQUIS (apixaban)  This medication education was reviewed with me or my healthcare representative as part of my discharge preparation.  Why was Eliquis prescribed for you? Eliquis was prescribed for you to reduce the risk of a blood clot forming that can cause a stroke if you have a medical condition called atrial fibrillation (a type of irregular heartbeat).  What do You need to know about Eliquis ? Take your Eliquis TWICE DAILY - one tablet in the morning and one tablet in the evening with or without food. If you have difficulty swallowing the tablet whole please discuss with your pharmacist how to take the medication safely.  Take Eliquis exactly as prescribed by your doctor and DO NOT stop taking Eliquis without talking to the doctor who prescribed the medication.  Stopping may increase your risk of developing a stroke.  Refill your prescription before you run out.  After discharge, you should have regular check-up appointments with your healthcare provider that is prescribing your Eliquis.  In the future your dose may need to be changed if your kidney function or weight changes by a significant amount or as you get older.  What do you do if you miss a dose? If you miss a dose, take it as soon as you remember on the same day and resume taking twice daily.  Do not take more than one dose of ELIQUIS at the same time to make up a missed dose.  Important Safety Information A possible side effect of Eliquis is bleeding. You should call your healthcare provider right away if you experience any of the following: ? Bleeding from an injury or your nose that does not stop. ? Unusual colored urine (red or dark brown) or unusual colored stools (red or black). ? Unusual bruising for unknown reasons. ? A serious fall or if you hit  your head (even if there is no bleeding).  Some medicines may interact with Eliquis and might increase your risk of bleeding or clotting while on Eliquis. To help avoid this, consult your healthcare provider or pharmacist prior to using any new prescription or non-prescription medications, including herbals, vitamins, non-steroidal anti-inflammatory drugs (NSAIDs) and supplements.  This website has more information on Eliquis (apixaban): http://www.eliquis.com/eliquis/home

## 2019-05-15 NOTE — Progress Notes (Signed)
Progress Note  Patient Name: Alexis [name] Date of Encounter: 05/15/2019  Primary Cardiologist: [facility]  Subjective   No complaints  Inpatient Medications    Scheduled Meds: . amiodarone  400 mg Oral BID  . amitriptyline  25 mg Oral QHS  . apixaban  5 mg Oral BID  . atorvastatin  80 mg Oral Daily  . bisacodyl  10 mg Rectal Once  . calcium-vitamin D  1 tablet Oral Daily  . Chlorhexidine Gluconate Cloth  6 each Topical Q0600  . docusate sodium  100 mg Oral QODAY  . doxercalciferol  4.5 mcg Intravenous Q T,Th,Sa-HD  . ferric citrate  420 mg Oral TID WC  . insulin aspart  0-5 Units Subcutaneous QHS  . insulin aspart  0-6 Units Subcutaneous TID WC  . midodrine  5 mg Oral TID WC  . pantoprazole  40 mg Oral BID  . sevelamer carbonate  1,600 mg Oral TID WC  . sodium chloride flush  3 mL Intravenous Q12H   Continuous Infusions: . sodium chloride    . sodium chloride    . sodium chloride     PRN Meds: sodium chloride, sodium chloride, sodium chloride, acetaminophen **OR** acetaminophen, acetaminophen, albuterol, gabapentin, heparin, lidocaine (PF), lidocaine-prilocaine, nitroGLYCERIN, ondansetron **OR** ondansetron (ZOFRAN) IV, pentafluoroprop-tetrafluoroeth, polyethylene glycol, sodium chloride flush, tiZANidine   Vital Signs    Vitals:   05/15/19 0600 05/15/19 0800 05/15/19 0900 05/15/19 1000  BP: (!) 159/124 (!) 120/55 (!) 124/44 104/67  Pulse: 67 84 88 84  Resp: 20 17 19 19   Temp:  97.7 F (36.5 C)    TempSrc:  Oral    SpO2: 92% 92% 95% 93%  Weight:      Height:        Intake/Output Summary (Last 24 hours) at 05/15/2019 1100 Last data filed at 05/14/2019 2315 Gross per 24 hour  Intake 0 ml  Output 532 ml  Net -532 ml   Last 3 Weights 05/15/2019 05/14/2019 05/14/2019  Weight (lbs) 216 lb 11.4 oz 218 lb 4.1 oz 218 lb 4.1 oz  Weight (kg) 98.3 kg 99 kg 99 kg      Telemetry    NSR - Personally Reviewed  ECG    n/a - Personally Reviewed  Physical  Exam   GEN: No acute distress.   Neck: No JVD Cardiac: RRR, no murmurs, rubs, or gallops.  Respiratory: Clear to auscultation bilaterally. GI: Soft, nontender, non-distended  MS: No edema; No deformity. Neuro:  Nonfocal  Psych: Normal affect   Labs    High Sensitivity Troponin:   Recent Labs  Lab 05/12/19 0849 05/12/19 1140  TROPONINIHS 71* 68*      Chemistry Recent Labs  Lab 05/13/19 0402 05/14/19 0948 05/15/19 0622  NA 135 134* 134*  K 3.9 4.4 3.8  CL 94* 93* 93*  CO2 22 22 23   GLUCOSE 141* 158* 118*  BUN 37* 51* 24*  CREATININE 7.62* 9.78* 5.67*  CALCIUM 9.3 9.0 8.5*  ALBUMIN 3.4* 3.3* 3.3*  GFRNONAA 5* 4* 7*  GFRAA 6* 4* 8*  ANIONGAP 19* 19* 18*     Hematology Recent Labs  Lab 05/11/19 0403 05/13/19 0402 05/15/19 0622  WBC 8.8 8.1 11.4*  RBC 4.63 4.76 4.52  HGB 12.1 12.3 11.8*  HCT 37.9 39.2 37.0  MCV 81.9 82.4 81.9  MCH 26.1 25.8* 26.1  MCHC 31.9 31.4 31.9  RDW 15.8* 15.4 15.6*  PLT 187 179 167    BNPNo results for input(s): BNP, PROBNP  in the last 168 hours.   DDimer No results for input(s): DDIMER in the last 168 hours.   Radiology    No results found.  Cardiac Studies    Patient Profile     Alexis [name] a 73 y.o.femalewith a hx of CAD and chronic systolic HFwho is being seen today for the evaluation ofafib with RVRat the request of Dr Wynetta Emery  Assessment & Plan    1. Afib with RVR - new diagnosis this admission - rate control complicated by soft bp's. We started amiodarone drip in setting of uncontrolled afib with RVR with soft bp's in patient with systolic HF and CAD, ESRD. Low bp's on amio gtt, stopped. Started on oral amio - started on eliquis  - would do amio 400mg  bid x 1 week, then 200mg  bid for 2 weeks, then 200mg  daily.    2. CAD - no active issues  3. Chronic systolic HF - fluid management per HD - does not appear volume overloaded - home hydral and imdur on hold due to soft bp's  4. Relative  Hypotension - echo with stable LVEF 40-45%, mod RV dysfunction. NO clear acute cardiac etiology.  - normal WBC, afebrile. No clear signs of sepsis - her coreg and hydralazine are held.  - from renal notes I believe dry weight is 98.5 kg, she is 99 kg today.Would argue against hypovolemia - random cortisol was ok    started on midodrine 5mg  tid. BP's labile but overall improved. Continue current therapy.    [facility] for discharge from cardiac standpoint, see amio recs above. We will arrange f/u 2-3 weeks, she would like to follow in our cardiology clinic going forward    For questions or updates, please contact [place] Please consult [email or web address] for contact info under        Signed, Carlyle Dolly, MD  05/15/2019, 11:01 AM

## 2019-05-15 NOTE — Progress Notes (Signed)
Patient to be discharged home and in stable condition. Patient's IV removed, WNL. Patient given discharge instructions and verbalized understanding. Patient discharged to awaiting vehicle by staff via wheelchair.   Celestia Khat, RN

## 2019-05-29 ENCOUNTER — Ambulatory Visit: Payer: Medicare Other | Admitting: Family Medicine

## 2019-05-29 NOTE — Progress Notes (Signed)
Cardiology Office Note    Date:  05/31/2019   ID:  Alexis, [name] 01-27-1946, MRN [id number]  PCP:  System, Provider Not In  Cardiologist: Carlyle Dolly, MD    Chief Complaint  Patient presents with  . Hospitalization Follow-up    History of Present Illness:    Alexis [name] is a 74 y.o. female with past medical history of CAD (s/p prior stenting in 2015), chronic LBBB, chronic combined systolic and diastolic CHF (EF 62-70% by echo in 04/2019), HTN, HLD, Type 2 DM, COPD and ESRD who presents to the office today for hospital follow-up.  She most recently presented to [facility] ED on 05/09/2019 from dialysis after being found to be in atrial fibrillation with RVR. She was started on IV Cardizem at the time of admission but titration of AV nodal blocking agents was limited given her episodes of hypotension. She was started on IV Amiodarone but also had intermittent episodes of hypotension with this and was changed to Amiodarone 400 mg twice daily with Lopressor being discontinued.  PTA Imdur and Hydralazine were held given soft BP. She did convert back to normal sinus rhythm and it was recommended for her to be on Amiodarone 400 mg twice daily for 1 week then 200 mg twice daily for 2 weeks and 200 mg daily. She was was also continued on Eliquis for anticoagulation. Was started on Midodrine 5mg  TID for hypotension with PTA Coreg, Hydralazine and Imdur being discontinued at the time of discharge.  In talking with the patient today, she denies any recent chest pain or palpitations. Was overall asymptomatic with her arrhythmia. She has baseline dyspnea on exertion but denies any orthopnea, PND or edema. She does report having a decreased appetite for the past several weeks.   BP has improved when checked at HD but she reports her SBP still drops to less than 100 during her sessions. Has dizziness when changing positions. Denies any recent falls but is wearing a gait belt today as she  reports her husband is having to help her with positional changes.    Past Medical History:  Diagnosis Date  . Arthritis   . Cancer [facility]) 2005    left breast  . CHF (congestive heart failure) ([place])   . Chronic back pain   . Chronic kidney disease 03/2014   dialysis t/th/sa  . Coronary artery disease   . Diabetes mellitus    Type 2  . Diabetic nephropathy ([place]) 01-08-13  . Dyslipidemia 01-08-13  . ESRD on hemodialysis ([place])    Tu, Th, Sat  . Headache   . Hyperlipidemia   . Hypertension   . LBBB (left bundle branch block)   . Proteinuria 01-08-13  . Pulmonary hypertension ([place])    PA peak pressure 73 mmHg 08/05/17 echo  . Thyroid disease 01-08-13   Hyper-parathyroidism-secondary  . Wears glasses     Past Surgical History:  Procedure Laterality Date  . A/V FISTULAGRAM N/A 08/25/2017   Procedure: A/V FISTULAGRAM - Left Arm;  Surgeon: Angelia Mould, MD;  Location: [place] CV LAB;  Service: Cardiovascular;  Laterality: N/A;  . A/V FISTULAGRAM N/A 02/09/2018   Procedure: A/V [id number];  Surgeon: Elam Dutch, MD;  Location: [place] CV LAB;  Service: Cardiovascular;  Laterality: N/A;  . A/V FISTULAGRAM Left 04/04/2018   Procedure: A/V FISTULAGRAM;  Surgeon: Marty Heck, MD;  Location: [place] CV LAB;  Service: Cardiovascular;  Laterality: Left;  . ABDOMINAL AORTAGRAM N/A 08/11/2014  Procedure: ABDOMINAL AORTAGRAM;  Surgeon: Angelia Mould, MD;  Location: [facility] CATH LAB;  Service: Cardiovascular;  Laterality: N/A;  . ABDOMINAL HYSTERECTOMY    . APPENDECTOMY    . Arm surgery     Left arm trauma  . AV FISTULA PLACEMENT Left 08/21/2014   Procedure: LEFT ARM ARTERIOVENOUS (AV) FISTULA CREATION ;  Surgeon: Angelia Mould, MD;  Location: [facility] OR;  Service: Vascular;  Laterality: Left;  . AV FISTULA PLACEMENT Right 06/13/2018   Procedure: insertion of right arm ARTERIOVENOUS (AV) gore-tex GRAFT;  Surgeon: Rosetta Posner, MD;  Location: [place];   Service: Vascular;  Laterality: Right;  . BACK SURGERY    . CATARACT EXTRACTION W/PHACO Left 12/22/2014   Procedure: CATARACT EXTRACTION PHACO AND INTRAOCULAR LENS PLACEMENT (IOC);  Surgeon: Tonny Branch, MD;  Location: [facility];  Service: Ophthalmology;  Laterality: Left;  CDE 13.48  . CORONARY ANGIOPLASTY  12/19/2003   inferior wall hypokinesis. ef 50%  . dialysis catheter    . INSERTION OF DIALYSIS CATHETER Right 06/13/2018   Procedure: INSERTION OF DIALYSIS CATHETER, right internal jugular;  Surgeon: Rosetta Posner, MD;  Location: [place];  Service: Vascular;  Laterality: Right;  . IR FLUORO GUIDE CV LINE RIGHT  04/06/2018  . IR REMOVAL TUN CV CATH W/O FL  04/25/2018  . IR REMOVAL TUN CV CATH W/O FL  07/20/2018  . IR US GUIDE VASC ACCESS RIGHT  04/06/2018  . LIGATION OF ARTERIOVENOUS  FISTULA Left 03/18/2019   Procedure: LIGATION OF ARTERIOVENOUS  FISTULA  LEFT ARM;  Surgeon: Angelia Mould, MD;  Location: [place];  Service: Vascular;  Laterality: Left;  Marland Kitchen MASTECTOMY     Left  . PERIPHERAL VASCULAR BALLOON ANGIOPLASTY Left 08/25/2017   Procedure: PERIPHERAL VASCULAR BALLOON ANGIOPLASTY;  Surgeon: Angelia Mould, MD;  Location: [place] CV LAB;  Service: Cardiovascular;  Laterality: Left;  arm fistula  . PERIPHERAL VASCULAR BALLOON ANGIOPLASTY Left 02/09/2018   Procedure: PERIPHERAL VASCULAR BALLOON ANGIOPLASTY;  Surgeon: Elam Dutch, MD;  Location: [place] CV LAB;  Service: Cardiovascular;  Laterality: Left;  AV Fistula  . PERIPHERAL VASCULAR BALLOON ANGIOPLASTY Left 04/04/2018   Procedure: PERIPHERAL VASCULAR BALLOON ANGIOPLASTY;  Surgeon: Marty Heck, MD;  Location: [place] CV LAB;  Service: Cardiovascular;  Laterality: Left;  UPPER ARM FISTULA  . PERIPHERAL VASCULAR CATHETERIZATION N/A 09/16/2015   Procedure: Fistulagram;  Surgeon: Rosetta Posner, MD;  Location: [place] CV LAB;  Service: Cardiovascular;  Laterality: N/A;  . SPINE SURGERY    . TONSILLECTOMY      . TRANSTHORACIC ECHOCARDIOGRAM  10/01/2010    Left ventricle: The cavity size was mildly dilated. Wall thickness was increased in a pattern of mild LVH. Systolic function was   mildly reduced. The estimated ejection fraction was in the range  of 45% to 50%.   . TUBAL LIGATION      Current Medications: Outpatient Medications Prior to Visit  Medication Sig Dispense Refill  . acetaminophen (TYLENOL) 325 MG tablet Take 650 mg by mouth every 6 (six) hours as needed for moderate pain.    Marland Kitchen albuterol (VENTOLIN HFA) 108 (90 Base) MCG/ACT inhaler Inhale 1-2 puffs into the lungs every 6 (six) hours as needed for wheezing or shortness of breath.    Marland Kitchen amitriptyline (ELAVIL) 25 MG tablet Take 25 mg by mouth at bedtime.    Lorin Picket 1 GM 210 MG(Fe) tablet Take 420 mg by mouth 3 (three) times daily with meals. Take  2 tablets (420 mg) by mouth 3 times daily with meals  0  . calcitRIOL (ROCALTROL) 0.25 MCG capsule Take 0.25 mcg by mouth 3 (three) times a week. On Dialysis days Tues, Thur, Sat.    . Calcium-Vitamin D-Vitamin K 500-500-40 MG-UNT-MCG CHEW Chew 1 tablet by mouth daily.    . diclofenac sodium (VOLTAREN) 1 % GEL Apply 1 application topically 2 (two) times daily as needed (pain.).     Marland Kitchen docusate sodium (COLACE) 100 MG capsule Take 100 mg by mouth every other day.    . gabapentin (NEURONTIN) 100 MG capsule Take 200 mg by mouth at bedtime as needed (pain).   3  . lidocaine-prilocaine (EMLA) cream Apply 1 application topically daily as needed (prior to port being accessed).     . nitroGLYCERIN (NITROSTAT) 0.4 MG SL tablet Place 0.4 mg under the tongue every 5 (five) minutes as needed for chest pain.    . polyethylene glycol (MIRALAX / GLYCOLAX) packet Take 17 g by mouth daily as needed for mild constipation.     . sevelamer carbonate (RENVELA) 800 MG tablet Take 1,600 mg by mouth 3 (three) times daily with meals.    Marland Kitchen tiZANidine (ZANAFLEX) 4 MG tablet Take 4 mg by mouth at bedtime as needed for muscle  spasms.  2  . triamcinolone cream (KENALOG) 0.5 % Apply 1 application topically 2 (two) times daily as needed (rash).    Marland Kitchen amiodarone (PACERONE) 200 MG tablet 2 po bid x 1 week, then 1 po BID x 2 weeks, then 1 po daily 60 tablet 1  . apixaban (ELIQUIS) 5 MG TABS tablet Take 1 tablet (5 mg total) by mouth 2 (two) times daily. 60 tablet 1  . atorvastatin (LIPITOR) 80 MG tablet Take 80 mg by mouth daily.    . midodrine (PROAMATINE) 5 MG tablet Take 1 tablet (5 mg total) by mouth 3 (three) times daily with meals. 90 tablet 1   Facility-Administered Medications Prior to Visit  Medication Dose Route Frequency Provider Last Rate Last Admin  . lidocaine (PF) (XYLOCAINE) 1 % injection 0.3 mL  0.3 mL Other Once Magnus Sinning, MD         Allergies:   Olmesartan   Social History   Socioeconomic History  . Marital status: Legally Separated    Spouse name: Not on file  . Number of children: 3  . Years of education: Not on file  . Highest education level: Not on file  Occupational History  . Occupation: Retired  Tobacco Use  . Smoking status: Former Smoker    Packs/day: 1.00    Years: 50.00    Pack years: 50.00    Types: Cigarettes    Quit date: 05/16/1998    Years since quitting: 21.0  . Smokeless tobacco: Never Used  Substance and Sexual Activity  . Alcohol use: No    Alcohol/week: 0.0 standard drinks  . Drug use: No  . Sexual activity: Yes    Birth control/protection: Post-menopausal  Other Topics Concern  . Not on file  Social History Narrative   Lives at [facility] alone (most of the time).  Three grandchildren and a one year old great grandchild   Social Determinants of Health   Financial Resource Strain:   . Difficulty of Paying Living Expenses: Not on file  Food Insecurity:   . Worried About Charity fundraiser in the Last Year: Not on file  . Ran Out of Food in the Last Year: Not on file  Transportation Needs:   . Film/video editor (Medical): Not on file  . Lack of  Transportation (Non-Medical): Not on file  Physical Activity:   . Days of Exercise per Week: Not on file  . Minutes of Exercise per Session: Not on file  Stress:   . Feeling of Stress : Not on file  Social Connections:   . Frequency of Communication with Friends and Family: Not on file  . Frequency of Social Gatherings with Friends and Family: Not on file  . Attends Religious Services: Not on file  . Active Member of Clubs or Organizations: Not on file  . Attends Archivist Meetings: Not on file  . Marital Status: Not on file     Family History:  The patient's family history includes Breast cancer in her mother; Cancer (age of onset: 42) in her mother; Heart attack in her mother; Heart disease in her mother; Hyperlipidemia in her mother; Hypertension in her mother.   Review of Systems:   Please see the history of present illness.     General:  No chills, fever, night sweats or weight changes. Positive for dizziness and decreased appetite.  Cardiovascular:  No chest pain, dyspnea on exertion, edema, orthopnea, palpitations, paroxysmal nocturnal dyspnea. Dermatological: No rash, lesions/masses Respiratory: No cough, dyspnea Urologic: No hematuria, dysuria Abdominal:   No nausea, vomiting, diarrhea, bright red blood per rectum, melena, or hematemesis Neurologic:  No visual changes, wkns, changes in mental status. All other systems reviewed and are otherwise negative except as noted above.   Physical Exam:    VS:  BP (!) 142/84   Pulse 89   Temp (!) 97.3 F (36.3 C)   Ht 5\' 6"  (1.676 m)   SpO2 97%   BMI 34.98 kg/m    General: Well developed, well nourished,female appearing in no acute distress. Head: Normocephalic, atraumatic, sclera non-icteric, no xanthomas, nares are without discharge.  Neck: No carotid bruits. JVD not elevated.  Lungs: Respirations regular and unlabored, without wheezes or rales.  Heart: Regular rate and rhythm. No S3 or S4.  No murmur, no rubs,  or gallops appreciated. Abdomen: Soft, non-tender, non-distended with normoactive bowel sounds. No hepatomegaly. No rebound/guarding. No obvious abdominal masses. Msk:  Strength and tone appear normal for age. No joint deformities or effusions. Extremities: No clubbing or cyanosis. Trace ankle edema bilaterally.  Distal pedal pulses are 2+ bilaterally. Neuro: Alert and oriented X 3. Moves all extremities spontaneously. No focal deficits noted. Psych:  Responds to questions appropriately with a normal affect. Skin: No rashes or lesions noted  Wt Readings from Last 3 Encounters:  05/15/19 216 lb 11.4 oz (98.3 kg)  03/18/19 227 lb (103 kg)  03/11/19 227 lb (103 kg)     Studies/Labs Reviewed:   EKG:  EKG is not ordered today.   Recent Labs: 06/07/2018: ALT 7 05/09/2019: TSH 1.267 05/13/2019: Magnesium 2.1 05/15/2019: BUN 24; Creatinine, Ser 5.67; Hemoglobin 11.8; Platelets 167; Potassium 3.8; Sodium 134   Lipid Panel    Component Value Date/Time   CHOL (H) 08/08/2008 1050    255        ATP III CLASSIFICATION:  <200     mg/dL   Desirable  200-239  mg/dL   Borderline High  >=240    mg/dL   High          TRIG 121 08/08/2008 1050   HDL 45 08/08/2008 1050   CHOLHDL 5.7 08/08/2008 1050   VLDL 24 08/08/2008 1050  LDLCALC (H) 08/08/2008 1050    186        Total Cholesterol/HDL:CHD Risk Coronary Heart Disease Risk Table                     Men   Women  1/2 Average Risk   3.4   3.3  Average Risk       5.0   4.4  2 X Average Risk   9.6   7.1  3 X Average Risk  23.4   11.0        Use the calculated Patient Ratio above and the CHD Risk Table to determine the patient's CHD Risk.        ATP III CLASSIFICATION (LDL):  <100     mg/dL   Optimal  100-129  mg/dL   Near or Above                    Optimal  130-159  mg/dL   Borderline  160-189  mg/dL   High  >190     mg/dL   Very High    Additional studies/ records that were reviewed today include:   Echocardiogram:  04/2019 IMPRESSIONS    1. Left ventricular ejection fraction, by visual estimation, is 40 to 45%. The left ventricle has moderately decreased function. There is moderately increased left ventricular hypertrophy.  2. Severe hypokinesis of the left ventricular, basal-mid inferior wall, inferoseptal wall and inferolateral wall.  3. Left ventricular diastolic function could not be evaluated.  4. The left ventricle demonstrates regional wall motion abnormalities.  5. Global right ventricle has moderately reduced systolic function.The right ventricular size is moderately enlarged. Right vetricular wall thickness was not assessed.  6. Left atrial size was severely dilated.  7. Right atrial size was severely dilated.  8. The pericardial effusion is posterior to the left ventricle.  9. Trivial pericardial effusion is present. 10. Moderate mitral annular calcification. 11. The mitral valve is degenerative. Mild to moderate mitral valve regurgitation. 12. The tricuspid valve is normal in structure. 13. The aortic valve is tricuspid. Aortic valve regurgitation is not visualized. Mild aortic valve sclerosis without stenosis. 14. The pulmonic valve was not well visualized. Pulmonic valve regurgitation is not visualized. 15. Mildly elevated pulmonary artery systolic pressure. 16. The inferior vena cava is normal in size with greater than 50% respiratory variability, suggesting right atrial pressure of 3 mmHg. 17. No significant change from prior study. 18. Prior images reviewed side by side.  Assessment:    1. PAF (paroxysmal atrial fibrillation) ([place])   2. Chronic combined systolic and diastolic congestive heart failure ([place])   3. Coronary artery disease involving native coronary artery of native heart without angina pectoris   4. Hyperlipidemia LDL goal <70   5. ESRD (end stage renal disease) ([place])      Plan:   In order of problems listed above:  1. New-Onset Atrial Fibrillation - new  diagnosis for the patient during her admission last month and she was overall asymptomatic with her arrhythmia. Converted back to NSR during admission and is maintaining NSR by examination today. She reports a decreased appetite since hospital discharge and it is possible her Amiodarone is playing a role. Will go ahead and reduce Amiodarone to 200mg  daily as this was scheduled to be reduced next week.  - she denies any evidence of active bleeding. Continue Eliquis for anticoagulation.   2. Chronic Combined Systolic and Diastolic CHF - she has a known reduced  EF of 40-45% by recent echo with moderately reduced RV function. She had significant hypotension during admission and reports still having hypotension during HD sessions. Did not check orthostatics today as she is not steady on her feet. Will hold off on restarting her Coreg, Imdur or Hydralazine for now given her reported dizziness which seems consistent with orthostasis. Remains on Midodrine 5mg  TID. Pending BP in the future, could perhaps add back low-dose Coreg on non-HD days.   3. CAD - s/p stenting in 2015. Records in [place] do not specify further details. She denies any recent chest pain or changes in her respiratory status. Continue statin therapy. No longer on ASA given the need for anticoagulation and BB discontinued during her recent admission due to hypotension.   4. HLD - followed by PCP. She remains on Atorvastatin 80mg  daily. Goal LDL is less than 70 with known CAD.   5. ESRD - on HD - Tuesday, Thursday, Saturday schedule. Continue Midodrine 5mg  TID given hypotension during HD and concern for orthostasis given her reports of dizziness.   Medication Adjustments/Labs and Tests Ordered: Current medicines are reviewed at length with the patient today.  Concerns regarding medicines are outlined above.  Medication changes, Labs and Tests ordered today are listed in the Patient Instructions below. Patient Instructions   Medication Instructions:  Your physician has recommended you make the following change in your medication:  Decrease Amiodarone to 200 mg Daily   *If you need a refill on your cardiac medications before your next appointment, please call your pharmacy*  Lab Work: NONE  If you have labs (blood work) drawn today and your tests are completely normal, you will receive your results only by: Marland Kitchen MyChart Message (if you have MyChart) OR . A paper copy in the mail If you have any lab test that is abnormal or we need to change your treatment, we will call you to review the results.  Testing/Procedures: NONE   Follow-Up: At [facility], you and your health needs are our priority.  As part of our continuing mission to provide you with exceptional heart care, we have created designated Provider Care Teams.  These Care Teams include your primary Cardiologist (physician) and Advanced Practice Providers (APPs -  Physician Assistants and Nurse Practitioners) who all work together to provide you with the care you need, when you need it.  Your next appointment:   4 month(s)  The format for your next appointment:   In Person  Provider:   Carlyle Dolly, MD  Other Instructions Thank you for choosing [facility]!       Signed, Erma Heritage, PA-C  05/31/2019 9:50 AM    [facility] [address] [place], [place] [address] Phone: [phone] Fax: [phone]

## 2019-05-30 ENCOUNTER — Encounter: Payer: Self-pay | Admitting: Student

## 2019-05-30 ENCOUNTER — Ambulatory Visit (INDEPENDENT_AMBULATORY_CARE_PROVIDER_SITE_OTHER): Payer: Medicare Other | Admitting: Student

## 2019-05-30 VITALS — BP 142/84 | HR 89 | Temp 97.3°F | Ht 66.0 in

## 2019-05-30 DIAGNOSIS — I2510 Atherosclerotic heart disease of native coronary artery without angina pectoris: Secondary | ICD-10-CM

## 2019-05-30 DIAGNOSIS — I480 Paroxysmal atrial fibrillation: Secondary | ICD-10-CM

## 2019-05-30 DIAGNOSIS — N186 End stage renal disease: Secondary | ICD-10-CM

## 2019-05-30 DIAGNOSIS — E785 Hyperlipidemia, unspecified: Secondary | ICD-10-CM | POA: Diagnosis not present

## 2019-05-30 DIAGNOSIS — I5042 Chronic combined systolic (congestive) and diastolic (congestive) heart failure: Secondary | ICD-10-CM

## 2019-05-30 MED ORDER — AMIODARONE HCL 200 MG PO TABS: 200.0000 mg | ORAL_TABLET | Freq: Every day | ORAL | 3 refills | Status: AC

## 2019-05-30 MED ORDER — MIDODRINE HCL 5 MG PO TABS: 5.0000 mg | ORAL_TABLET | Freq: Three times a day (TID) | ORAL | 11 refills | Status: DC

## 2019-05-30 MED ORDER — APIXABAN 5 MG PO TABS: 5.0000 mg | ORAL_TABLET | Freq: Two times a day (BID) | ORAL | 11 refills | Status: DC

## 2019-05-30 MED ORDER — ATORVASTATIN CALCIUM 80 MG PO TABS: 80.0000 mg | ORAL_TABLET | Freq: Every day | ORAL | 3 refills | Status: AC

## 2019-05-30 NOTE — Patient Instructions (Addendum)
Medication Instructions:  Your physician has recommended you make the following change in your medication:  Decrease Amiodarone to 200 mg Daily   *If you need a refill on your cardiac medications before your next appointment, please call your pharmacy*  Lab Work: NONE  If you have labs (blood work) drawn today and your tests are completely normal, you will receive your results only by: Marland Kitchen MyChart Message (if you have MyChart) OR . A paper copy in the mail If you have any lab test that is abnormal or we need to change your treatment, we will call you to review the results.  Testing/Procedures: NONE   Follow-Up: At [facility], you and your health needs are our priority.  As part of our continuing mission to provide you with exceptional heart care, we have created designated Provider Care Teams.  These Care Teams include your primary Cardiologist (physician) and Advanced Practice Providers (APPs -  Physician Assistants and Nurse Practitioners) who all work together to provide you with the care you need, when you need it.  Your next appointment:   4 month(s)  The format for your next appointment:   In Person  Provider:   Carlyle Dolly, MD  Other Instructions Thank you for choosing [facility]!

## 2019-05-31 ENCOUNTER — Encounter: Payer: Self-pay | Admitting: Student

## 2019-06-07 ENCOUNTER — Encounter: Payer: Self-pay | Admitting: *Deleted

## 2019-06-07 NOTE — Progress Notes (Signed)
[facility] request Shuntogram due to prolonged bleed with HD treatment.

## 2019-06-07 NOTE — Progress Notes (Signed)
Reviewed all pre-procedure instructions as per letter(see letter) with Kennyth Lose (significant other and caregiver). Verbalized understanding. All instructions and information faxed to [facility] also.

## 2019-06-11 ENCOUNTER — Other Ambulatory Visit (HOSPITAL_COMMUNITY)
Admission: RE | Admit: 2019-06-11 | Discharge: 2019-06-11 | Disposition: A | Discharge status: Home / Self Care | Payer: Medicare Other | Source: Ambulatory Visit | Attending: Vascular Surgery | Admitting: Vascular Surgery

## 2019-06-11 ENCOUNTER — Encounter (HOSPITAL_COMMUNITY): Payer: Self-pay

## 2019-06-11 ENCOUNTER — Inpatient Hospital Stay (HOSPITAL_COMMUNITY): Payer: Medicare Other

## 2019-06-11 ENCOUNTER — Other Ambulatory Visit: Payer: Self-pay

## 2019-06-11 ENCOUNTER — Emergency Department (HOSPITAL_COMMUNITY): Payer: Medicare Other

## 2019-06-11 ENCOUNTER — Inpatient Hospital Stay (HOSPITAL_COMMUNITY)
Admission: EM | Admit: 2019-06-11 | Discharge: 2019-06-14 | DRG: 091 | Disposition: A | Discharge status: Skilled Nursing Facility | Payer: Medicare Other | Attending: Internal Medicine | Admitting: Internal Medicine

## 2019-06-11 DIAGNOSIS — E1122 Type 2 diabetes mellitus with diabetic chronic kidney disease: Secondary | ICD-10-CM | POA: Diagnosis present

## 2019-06-11 DIAGNOSIS — I447 Left bundle-branch block, unspecified: Secondary | ICD-10-CM | POA: Diagnosis present

## 2019-06-11 DIAGNOSIS — E118 Type 2 diabetes mellitus with unspecified complications: Secondary | ICD-10-CM | POA: Diagnosis present

## 2019-06-11 DIAGNOSIS — N2581 Secondary hyperparathyroidism of renal origin: Secondary | ICD-10-CM | POA: Diagnosis present

## 2019-06-11 DIAGNOSIS — I5042 Chronic combined systolic (congestive) and diastolic (congestive) heart failure: Secondary | ICD-10-CM | POA: Diagnosis present

## 2019-06-11 DIAGNOSIS — R410 Disorientation, unspecified: Secondary | ICD-10-CM | POA: Diagnosis not present

## 2019-06-11 DIAGNOSIS — D631 Anemia in chronic kidney disease: Secondary | ICD-10-CM | POA: Diagnosis present

## 2019-06-11 DIAGNOSIS — Z8249 Family history of ischemic heart disease and other diseases of the circulatory system: Secondary | ICD-10-CM

## 2019-06-11 DIAGNOSIS — Z8349 Family history of other endocrine, nutritional and metabolic diseases: Secondary | ICD-10-CM

## 2019-06-11 DIAGNOSIS — I482 Chronic atrial fibrillation, unspecified: Secondary | ICD-10-CM | POA: Diagnosis present

## 2019-06-11 DIAGNOSIS — I132 Hypertensive heart and chronic kidney disease with heart failure and with stage 5 chronic kidney disease, or end stage renal disease: Secondary | ICD-10-CM | POA: Diagnosis present

## 2019-06-11 DIAGNOSIS — M1990 Unspecified osteoarthritis, unspecified site: Secondary | ICD-10-CM | POA: Diagnosis present

## 2019-06-11 DIAGNOSIS — Z20822 Contact with and (suspected) exposure to covid-19: Secondary | ICD-10-CM | POA: Diagnosis present

## 2019-06-11 DIAGNOSIS — N186 End stage renal disease: Secondary | ICD-10-CM | POA: Diagnosis not present

## 2019-06-11 DIAGNOSIS — Z6835 Body mass index (BMI) 35.0-35.9, adult: Secondary | ICD-10-CM | POA: Diagnosis not present

## 2019-06-11 DIAGNOSIS — T50905A Adverse effect of unspecified drugs, medicaments and biological substances, initial encounter: Secondary | ICD-10-CM | POA: Diagnosis present

## 2019-06-11 DIAGNOSIS — I208 Other forms of angina pectoris: Secondary | ICD-10-CM | POA: Diagnosis not present

## 2019-06-11 DIAGNOSIS — Z803 Family history of malignant neoplasm of breast: Secondary | ICD-10-CM

## 2019-06-11 DIAGNOSIS — I25118 Atherosclerotic heart disease of native coronary artery with other forms of angina pectoris: Secondary | ICD-10-CM | POA: Diagnosis present

## 2019-06-11 DIAGNOSIS — I639 Cerebral infarction, unspecified: Secondary | ICD-10-CM

## 2019-06-11 DIAGNOSIS — I272 Pulmonary hypertension, unspecified: Secondary | ICD-10-CM | POA: Diagnosis not present

## 2019-06-11 DIAGNOSIS — Z853 Personal history of malignant neoplasm of breast: Secondary | ICD-10-CM | POA: Diagnosis not present

## 2019-06-11 DIAGNOSIS — G92 Toxic encephalopathy: Principal | ICD-10-CM | POA: Diagnosis present

## 2019-06-11 DIAGNOSIS — Z888 Allergy status to other drugs, medicaments and biological substances status: Secondary | ICD-10-CM

## 2019-06-11 DIAGNOSIS — E1140 Type 2 diabetes mellitus with diabetic neuropathy, unspecified: Secondary | ICD-10-CM | POA: Diagnosis present

## 2019-06-11 DIAGNOSIS — R443 Hallucinations, unspecified: Secondary | ICD-10-CM | POA: Diagnosis present

## 2019-06-11 DIAGNOSIS — E079 Disorder of thyroid, unspecified: Secondary | ICD-10-CM | POA: Diagnosis present

## 2019-06-11 DIAGNOSIS — Z87891 Personal history of nicotine dependence: Secondary | ICD-10-CM

## 2019-06-11 DIAGNOSIS — I2510 Atherosclerotic heart disease of native coronary artery without angina pectoris: Secondary | ICD-10-CM | POA: Diagnosis present

## 2019-06-11 DIAGNOSIS — I2089 Other forms of angina pectoris: Secondary | ICD-10-CM | POA: Diagnosis present

## 2019-06-11 DIAGNOSIS — R4182 Altered mental status, unspecified: Secondary | ICD-10-CM | POA: Diagnosis present

## 2019-06-11 DIAGNOSIS — K219 Gastro-esophageal reflux disease without esophagitis: Secondary | ICD-10-CM | POA: Diagnosis present

## 2019-06-11 DIAGNOSIS — R531 Weakness: Secondary | ICD-10-CM | POA: Diagnosis not present

## 2019-06-11 DIAGNOSIS — E785 Hyperlipidemia, unspecified: Secondary | ICD-10-CM | POA: Diagnosis present

## 2019-06-11 DIAGNOSIS — Z992 Dependence on renal dialysis: Secondary | ICD-10-CM

## 2019-06-11 DIAGNOSIS — I10 Essential (primary) hypertension: Secondary | ICD-10-CM

## 2019-06-11 DIAGNOSIS — N189 Chronic kidney disease, unspecified: Secondary | ICD-10-CM | POA: Diagnosis present

## 2019-06-11 DIAGNOSIS — I509 Heart failure, unspecified: Secondary | ICD-10-CM

## 2019-06-11 DIAGNOSIS — I953 Hypotension of hemodialysis: Secondary | ICD-10-CM | POA: Diagnosis present

## 2019-06-11 DIAGNOSIS — Z7901 Long term (current) use of anticoagulants: Secondary | ICD-10-CM

## 2019-06-11 DIAGNOSIS — M898X9 Other specified disorders of bone, unspecified site: Secondary | ICD-10-CM | POA: Diagnosis present

## 2019-06-11 LAB — DIFFERENTIAL
Abs Immature Granulocytes: 0.04 10*3/uL (ref 0.00–0.07)
Basophils Absolute: 0.1 10*3/uL (ref 0.0–0.1)
Basophils Relative: 1 %
Eosinophils Absolute: 0.3 10*3/uL (ref 0.0–0.5)
Eosinophils Relative: 4 %
Immature Granulocytes: 0 %
Lymphocytes Relative: 12 %
Lymphs Abs: 1.1 10*3/uL (ref 0.7–4.0)
Monocytes Absolute: 1 10*3/uL (ref 0.1–1.0)
Monocytes Relative: 11 %
Neutro Abs: 6.9 10*3/uL (ref 1.7–7.7)
Neutrophils Relative %: 72 %

## 2019-06-11 LAB — AMMONIA: Ammonia: 42 umol/L — ABNORMAL HIGH (ref 9–35)

## 2019-06-11 LAB — CBC
HCT: 33.2 % — ABNORMAL LOW (ref 36.0–46.0)
Hemoglobin: 10.4 g/dL — ABNORMAL LOW (ref 12.0–15.0)
MCH: 24.8 pg — ABNORMAL LOW (ref 26.0–34.0)
MCHC: 31.3 g/dL (ref 30.0–36.0)
MCV: 79.2 fL — ABNORMAL LOW (ref 80.0–100.0)
Platelets: 222 10*3/uL (ref 150–400)
RBC: 4.19 MIL/uL (ref 3.87–5.11)
RDW: 15.9 % — ABNORMAL HIGH (ref 11.5–15.5)
WBC: 9.5 10*3/uL (ref 4.0–10.5)
nRBC: 0 % (ref 0.0–0.2)

## 2019-06-11 LAB — COMPREHENSIVE METABOLIC PANEL
ALT: 7 U/L (ref 0–44)
AST: 13 U/L — ABNORMAL LOW (ref 15–41)
Albumin: 3.1 g/dL — ABNORMAL LOW (ref 3.5–5.0)
Alkaline Phosphatase: 49 U/L (ref 38–126)
Anion gap: 11 (ref 5–15)
BUN: 38 mg/dL — ABNORMAL HIGH (ref 8–23)
CO2: 30 mmol/L (ref 22–32)
Calcium: 8.8 mg/dL — ABNORMAL LOW (ref 8.9–10.3)
Chloride: 92 mmol/L — ABNORMAL LOW (ref 98–111)
Creatinine, Ser: 8.18 mg/dL — ABNORMAL HIGH (ref 0.44–1.00)
GFR calc Af Amer: 5 mL/min — ABNORMAL LOW (ref >60)
GFR calc non Af Amer: 4 mL/min — ABNORMAL LOW (ref >60)
Glucose, Bld: 120 mg/dL — ABNORMAL HIGH (ref 70–99)
Potassium: 3.9 mmol/L (ref 3.5–5.1)
Sodium: 133 mmol/L — ABNORMAL LOW (ref 135–145)
Total Bilirubin: 0.7 mg/dL (ref 0.3–1.2)
Total Protein: 7.7 g/dL (ref 6.5–8.1)

## 2019-06-11 LAB — VITAMIN B12: Vitamin B-12: 347 pg/mL (ref 180–914)

## 2019-06-11 LAB — RESPIRATORY PANEL BY RT PCR (FLU A&B, COVID)
Influenza A by PCR: NEGATIVE
Influenza B by PCR: NEGATIVE
SARS Coronavirus 2 by RT PCR: NEGATIVE

## 2019-06-11 LAB — GLUCOSE, CAPILLARY: Glucose-Capillary: 125 mg/dL — ABNORMAL HIGH (ref 70–99)

## 2019-06-11 LAB — APTT: aPTT: 51 seconds — ABNORMAL HIGH (ref 24–36)

## 2019-06-11 LAB — ETHANOL: Alcohol, Ethyl (B): <10 mg/dL (ref <10)

## 2019-06-11 LAB — PROTIME-INR
INR: 3.1 — ABNORMAL HIGH (ref 0.8–1.2)
Prothrombin Time: 32.1 seconds — ABNORMAL HIGH (ref 11.4–15.2)

## 2019-06-11 LAB — TSH: TSH: 1.117 u[IU]/mL (ref 0.350–4.500)

## 2019-06-11 MED ORDER — MIDODRINE HCL 5 MG PO TABS
5.0000 mg | ORAL_TABLET | Freq: Three times a day (TID) | ORAL | Status: DC
  Administered 2019-06-12 – 2019-06-14 (×7): 5 mg via ORAL
  Filled 2019-06-11 (×7): qty 1

## 2019-06-11 MED ORDER — SENNOSIDES-DOCUSATE SODIUM 8.6-50 MG PO TABS: 1.0000 | ORAL_TABLET | Freq: Every evening | ORAL | Status: DC | PRN

## 2019-06-11 MED ORDER — HEPARIN SODIUM (PORCINE) 5000 UNIT/ML IJ SOLN: 5000.0000 [IU] | Freq: Three times a day (TID) | INTRAMUSCULAR | Status: DC

## 2019-06-11 MED ORDER — ASPIRIN 300 MG RE SUPP
300.0000 mg | Freq: Every day | RECTAL | Status: DC
  Filled 2019-06-11: qty 1

## 2019-06-11 MED ORDER — ASPIRIN 325 MG PO TABS
325.0000 mg | ORAL_TABLET | Freq: Every day | ORAL | Status: DC
  Administered 2019-06-12: 325 mg via ORAL
  Filled 2019-06-11: qty 1

## 2019-06-11 MED ORDER — FERRIC CITRATE 1 GM 210 MG(FE) PO TABS
420.0000 mg | ORAL_TABLET | Freq: Three times a day (TID) | ORAL | Status: DC
  Administered 2019-06-12 – 2019-06-14 (×7): 420 mg via ORAL
  Filled 2019-06-11 (×17): qty 2

## 2019-06-11 MED ORDER — APIXABAN 5 MG PO TABS: 5.0000 mg | ORAL_TABLET | Freq: Two times a day (BID) | ORAL | Status: DC

## 2019-06-11 MED ORDER — SODIUM CHLORIDE 0.9% FLUSH: 3.0000 mL | INTRAVENOUS | Status: DC | PRN

## 2019-06-11 MED ORDER — HEPARIN SODIUM (PORCINE) 1000 UNIT/ML DIALYSIS
20.0000 [IU]/kg | INTRAMUSCULAR | Status: DC | PRN
  Filled 2019-06-11 (×2): qty 3

## 2019-06-11 MED ORDER — PENTAFLUOROPROP-TETRAFLUOROETH EX AERO: 1.0000 "application " | INHALATION_SPRAY | CUTANEOUS | Status: DC | PRN

## 2019-06-11 MED ORDER — SODIUM CHLORIDE 0.9% FLUSH
3.0000 mL | Freq: Two times a day (BID) | INTRAVENOUS | Status: DC
  Administered 2019-06-12 – 2019-06-13 (×5): 3 mL via INTRAVENOUS

## 2019-06-11 MED ORDER — ATORVASTATIN CALCIUM 40 MG PO TABS
80.0000 mg | ORAL_TABLET | Freq: Every day | ORAL | Status: DC
  Administered 2019-06-12 – 2019-06-13 (×2): 80 mg via ORAL
  Filled 2019-06-11 (×2): qty 2

## 2019-06-11 MED ORDER — CHLORHEXIDINE GLUCONATE CLOTH 2 % EX PADS: 6.0000 | MEDICATED_PAD | Freq: Every day | CUTANEOUS | Status: DC

## 2019-06-11 MED ORDER — ACETAMINOPHEN 650 MG RE SUPP: 650.0000 mg | Freq: Four times a day (QID) | RECTAL | Status: DC | PRN

## 2019-06-11 MED ORDER — LIDOCAINE HCL (PF) 1 % IJ SOLN: 5.0000 mL | INTRAMUSCULAR | Status: DC | PRN

## 2019-06-11 MED ORDER — ACETAMINOPHEN 325 MG PO TABS: 650.0000 mg | ORAL_TABLET | Freq: Four times a day (QID) | ORAL | Status: DC | PRN

## 2019-06-11 MED ORDER — SODIUM CHLORIDE 0.9 % IV SOLN: 100.0000 mL | INTRAVENOUS | Status: DC | PRN

## 2019-06-11 MED ORDER — ALBUTEROL SULFATE (2.5 MG/3ML) 0.083% IN NEBU: 2.5000 mg | INHALATION_SOLUTION | Freq: Four times a day (QID) | RESPIRATORY_TRACT | Status: DC | PRN

## 2019-06-11 MED ORDER — STROKE: EARLY STAGES OF RECOVERY BOOK: Freq: Once | Status: AC

## 2019-06-11 MED ORDER — SODIUM CHLORIDE 0.9 % IV SOLN: 250.0000 mL | INTRAVENOUS | Status: DC | PRN

## 2019-06-11 MED ORDER — POLYETHYLENE GLYCOL 3350 17 G PO PACK
17.0000 g | PACK | Freq: Every day | ORAL | Status: DC | PRN
  Filled 2019-06-11: qty 1

## 2019-06-11 MED ORDER — CALCITRIOL 0.25 MCG PO CAPS
0.2500 ug | ORAL_CAPSULE | ORAL | Status: DC
  Administered 2019-06-12 – 2019-06-14 (×2): 0.25 ug via ORAL
  Filled 2019-06-11: qty 1

## 2019-06-11 MED ORDER — ONDANSETRON HCL 4 MG/2ML IJ SOLN: 4.0000 mg | Freq: Four times a day (QID) | INTRAMUSCULAR | Status: DC | PRN

## 2019-06-11 MED ORDER — SEVELAMER CARBONATE 800 MG PO TABS
1600.0000 mg | ORAL_TABLET | Freq: Three times a day (TID) | ORAL | Status: DC
  Administered 2019-06-12 – 2019-06-14 (×7): 1600 mg via ORAL
  Filled 2019-06-11 (×7): qty 2

## 2019-06-11 MED ORDER — AMIODARONE HCL 200 MG PO TABS
200.0000 mg | ORAL_TABLET | Freq: Every day | ORAL | Status: DC
  Administered 2019-06-12 – 2019-06-14 (×4): 200 mg via ORAL
  Filled 2019-06-11 (×3): qty 1

## 2019-06-11 MED ORDER — ALBUMIN HUMAN 25 % IV SOLN
25.0000 g | Freq: Once | INTRAVENOUS | Status: AC
  Administered 2019-06-11: 20:00:00 25 g via INTRAVENOUS

## 2019-06-11 MED ORDER — LIDOCAINE-PRILOCAINE 2.5-2.5 % EX CREA: 1.0000 "application " | TOPICAL_CREAM | CUTANEOUS | Status: DC | PRN

## 2019-06-11 MED ORDER — DOCUSATE SODIUM 100 MG PO CAPS
100.0000 mg | ORAL_CAPSULE | ORAL | Status: DC
  Administered 2019-06-12 – 2019-06-14 (×3): 100 mg via ORAL
  Filled 2019-06-11 (×3): qty 1

## 2019-06-11 MED ORDER — NITROGLYCERIN 0.4 MG SL SUBL: 0.4000 mg | SUBLINGUAL_TABLET | SUBLINGUAL | Status: DC | PRN

## 2019-06-11 MED ORDER — DARBEPOETIN ALFA 60 MCG/0.3ML IJ SOSY
60.0000 ug | PREFILLED_SYRINGE | INTRAMUSCULAR | Status: DC
  Filled 2019-06-11: qty 0.3

## 2019-06-11 MED ORDER — ONDANSETRON HCL 4 MG PO TABS: 4.0000 mg | ORAL_TABLET | Freq: Four times a day (QID) | ORAL | Status: DC | PRN

## 2019-06-11 NOTE — Progress Notes (Signed)
Patient in dialysis at beginning of shift.

## 2019-06-11 NOTE — ED Triage Notes (Signed)
EMS reports pt was at [facility] and they called ems for ams.  When ems arrived he says pt was alert and oriented but c/o generalized weakness.   EMS says dialysis staff says pt wasn't acting like herself.  EMS says spouse told staff that she hasn't been taking her medications.  Also being treated for UTI.  Denies any pain.  CBG 201 o2 sat 98% on room air, co2 35, hr 80-110, afib and lbbb per ems.  BP 118/70.  Pt has dry cough.

## 2019-06-11 NOTE — ED Notes (Signed)
ED TO INPATIENT HANDOFF REPORT  ED Nurse Name and Phone #: [phone]  S Name/Age/Gender Alexis [name] 74 y.o. female Room/Bed: APA16A/APA16A  Code Status   Code Status: Prior  Home/SNF/Other Home Patient oriented to: self Is this baseline? Yes   Triage Complete: Triage complete  Chief Complaint Altered mental status [R41.82]  Triage Note EMS reports pt was at [facility] and they called ems for ams.  When ems arrived he says pt was alert and oriented but c/o generalized weakness.   EMS says dialysis staff says pt wasn't acting like herself.  EMS says spouse told staff that she hasn't been taking her medications.  Also being treated for UTI.  Denies any pain.  CBG 201 o2 sat 98% on room air, co2 35, hr 80-110, afib and lbbb per ems.  BP 118/70.  Pt has dry cough.      Allergies Allergies  Allergen Reactions  . Olmesartan Other (See Comments)    Hyperkalemia     Level of Care/Admitting Diagnosis ED Disposition    ED Disposition Condition [place]: [facility] [[id number]]  Level of Care: Telemetry [5]  Covid Evaluation: Asymptomatic Screening Protocol (No Symptoms)  Diagnosis: Altered mental status [780.97.ICD-9-CM]  Admitting Physician: Murlean Iba [[id number]]  Attending Physician: Murlean Iba [[id number]]  Estimated length of stay: past midnight tomorrow  Certification:: I certify this patient will need inpatient services for at least 2 midnights       B Medical/Surgery History Past Medical History:  Diagnosis Date  . Arthritis   . Cancer [facility]) 2005    left breast  . CHF (congestive heart failure) ([place])   . Chronic back pain   . Chronic kidney disease 03/2014   dialysis t/th/sa  . Coronary artery disease   . Diabetes mellitus    Type 2  . Diabetic nephropathy ([place]) 01-08-13  . Dyslipidemia 01-08-13  . ESRD on hemodialysis ([place])    Tu, Th, Sat  . Headache   . Hyperlipidemia   . Hypertension   . LBBB (left bundle  branch block)   . Proteinuria 01-08-13  . Pulmonary hypertension ([place])    PA peak pressure 73 mmHg 08/05/17 echo  . Thyroid disease 01-08-13   Hyper-parathyroidism-secondary  . Wears glasses    Past Surgical History:  Procedure Laterality Date  . A/V FISTULAGRAM N/A 08/25/2017   Procedure: A/V FISTULAGRAM - Left Arm;  Surgeon: Angelia Mould, MD;  Location: [place] CV LAB;  Service: Cardiovascular;  Laterality: N/A;  . A/V FISTULAGRAM N/A 02/09/2018   Procedure: A/V [id number];  Surgeon: Elam Dutch, MD;  Location: [place] CV LAB;  Service: Cardiovascular;  Laterality: N/A;  . A/V FISTULAGRAM Left 04/04/2018   Procedure: A/V FISTULAGRAM;  Surgeon: Marty Heck, MD;  Location: [place] CV LAB;  Service: Cardiovascular;  Laterality: Left;  . ABDOMINAL AORTAGRAM N/A 08/11/2014   Procedure: ABDOMINAL Maxcine Ham;  Surgeon: Angelia Mould, MD;  Location: [facility] CATH LAB;  Service: Cardiovascular;  Laterality: N/A;  . ABDOMINAL HYSTERECTOMY    . APPENDECTOMY    . Arm surgery     Left arm trauma  . AV FISTULA PLACEMENT Left 08/21/2014   Procedure: LEFT ARM ARTERIOVENOUS (AV) FISTULA CREATION ;  Surgeon: Angelia Mould, MD;  Location: [place];  Service: Vascular;  Laterality: Left;  . AV FISTULA PLACEMENT Right 06/13/2018   Procedure: insertion of right arm ARTERIOVENOUS (AV) gore-tex GRAFT;  Surgeon: Rosetta Posner,  MD;  Location: [place];  Service: Vascular;  Laterality: Right;  . BACK SURGERY    . CATARACT EXTRACTION W/PHACO Left 12/22/2014   Procedure: CATARACT EXTRACTION PHACO AND INTRAOCULAR LENS PLACEMENT (IOC);  Surgeon: Tonny Branch, MD;  Location: [facility];  Service: Ophthalmology;  Laterality: Left;  CDE 13.48  . CORONARY ANGIOPLASTY  12/19/2003   inferior wall hypokinesis. ef 50%  . dialysis catheter    . INSERTION OF DIALYSIS CATHETER Right 06/13/2018   Procedure: INSERTION OF DIALYSIS CATHETER, right internal jugular;  Surgeon: Rosetta Posner, MD;  Location:  [place];  Service: Vascular;  Laterality: Right;  . IR FLUORO GUIDE CV LINE RIGHT  04/06/2018  . IR REMOVAL TUN CV CATH W/O FL  04/25/2018  . IR REMOVAL TUN CV CATH W/O FL  07/20/2018  . IR US GUIDE VASC ACCESS RIGHT  04/06/2018  . LIGATION OF ARTERIOVENOUS  FISTULA Left 03/18/2019   Procedure: LIGATION OF ARTERIOVENOUS  FISTULA  LEFT ARM;  Surgeon: Angelia Mould, MD;  Location: [place];  Service: Vascular;  Laterality: Left;  Marland Kitchen MASTECTOMY     Left  . PERIPHERAL VASCULAR BALLOON ANGIOPLASTY Left 08/25/2017   Procedure: PERIPHERAL VASCULAR BALLOON ANGIOPLASTY;  Surgeon: Angelia Mould, MD;  Location: [place] CV LAB;  Service: Cardiovascular;  Laterality: Left;  arm fistula  . PERIPHERAL VASCULAR BALLOON ANGIOPLASTY Left 02/09/2018   Procedure: PERIPHERAL VASCULAR BALLOON ANGIOPLASTY;  Surgeon: Elam Dutch, MD;  Location: [place] CV LAB;  Service: Cardiovascular;  Laterality: Left;  AV Fistula  . PERIPHERAL VASCULAR BALLOON ANGIOPLASTY Left 04/04/2018   Procedure: PERIPHERAL VASCULAR BALLOON ANGIOPLASTY;  Surgeon: Marty Heck, MD;  Location: [place] CV LAB;  Service: Cardiovascular;  Laterality: Left;  UPPER ARM FISTULA  . PERIPHERAL VASCULAR CATHETERIZATION N/A 09/16/2015   Procedure: Fistulagram;  Surgeon: Rosetta Posner, MD;  Location: [place] CV LAB;  Service: Cardiovascular;  Laterality: N/A;  . SPINE SURGERY    . TONSILLECTOMY    . TRANSTHORACIC ECHOCARDIOGRAM  10/01/2010    Left ventricle: The cavity size was mildly dilated. Wall thickness was increased in a pattern of mild LVH. Systolic function was   mildly reduced. The estimated ejection fraction was in the range  of 45% to 50%.   . TUBAL LIGATION       A IV Location/Drains/Wounds Patient Lines/Drains/Airways Status   Active Line/Drains/Airways    Name:   Placement date:   Placement time:   Site:   Days:   Peripheral IV 06/11/19 Left Forearm   06/11/19    0923    Forearm   less than 1   Fistula  / Graft Left Upper arm Arteriovenous fistula   08/21/14    1407    Upper arm   1755   Fistula / Graft Right Upper arm Arteriovenous vein graft   06/13/18    1139    Upper arm   363   Sheath 02/09/18 Left   02/09/18    1321    --   487          Intake/Output Last 24 hours No intake or output data in the 24 hours ending 06/11/19 1337  Labs/Imaging Results for orders placed or performed during the hospital encounter of 06/11/19 (from the past 48 hour(s))  Ethanol     Status: None   Collection Time: 06/11/19  9:51 AM  Result Value Ref Range   Alcohol, Ethyl (B) <10 <10 mg/dL    Comment: (NOTE)  Lowest detectable limit for serum alcohol is 10 mg/dL. For medical purposes only. Performed at [facility], [address], [place], [place] [address]   Protime-INR     Status: Abnormal   Collection Time: 06/11/19  9:51 AM  Result Value Ref Range   Prothrombin Time 32.1 (H) 11.4 - 15.2 seconds   INR 3.1 (H) 0.8 - 1.2    Comment: (NOTE) INR goal varies based on device and disease states. Performed at [facility], [address] [place], [place] [address]   APTT     Status: Abnormal   Collection Time: 06/11/19  9:51 AM  Result Value Ref Range   aPTT 51 (H) 24 - 36 seconds    Comment:        IF BASELINE aPTT IS ELEVATED, SUGGEST PATIENT RISK ASSESSMENT BE USED TO DETERMINE APPROPRIATE ANTICOAGULANT THERAPY. Performed at [facility], [address] [place], [place] [address]   CBC     Status: Abnormal   Collection Time: 06/11/19  9:51 AM  Result Value Ref Range   WBC 9.5 4.0 - 10.5 K/uL   RBC 4.19 3.87 - 5.11 MIL/uL   Hemoglobin 10.4 (L) 12.0 - 15.0 g/dL   HCT 33.2 (L) 36.0 - 46.0 %   MCV 79.2 (L) 80.0 - 100.0 fL   MCH 24.8 (L) 26.0 - 34.0 pg   MCHC 31.3 30.0 - 36.0 g/dL   RDW 15.9 (H) 11.5 - 15.5 %   Platelets 222 150 - 400 K/uL   nRBC 0.0 0.0 - 0.2 %    Comment: Performed at [facility], [address] [place], [place] [address]  Differential     Status: None   Collection  Time: 06/11/19  9:51 AM  Result Value Ref Range   Neutrophils Relative % 72 %   Neutro Abs 6.9 1.7 - 7.7 K/uL   Lymphocytes Relative 12 %   Lymphs Abs 1.1 0.7 - 4.0 K/uL   Monocytes Relative 11 %   Monocytes Absolute 1.0 0.1 - 1.0 K/uL   Eosinophils Relative 4 %   Eosinophils Absolute 0.3 0.0 - 0.5 K/uL   Basophils Relative 1 %   Basophils Absolute 0.1 0.0 - 0.1 K/uL   Immature Granulocytes 0 %   Abs Immature Granulocytes 0.04 0.00 - 0.07 K/uL    Comment: Performed at [facility], [address] [place], [place] [address]  Comprehensive metabolic panel     Status: Abnormal   Collection Time: 06/11/19  9:51 AM  Result Value Ref Range   Sodium 133 (L) 135 - 145 mmol/L   Potassium 3.9 3.5 - 5.1 mmol/L   Chloride 92 (L) 98 - 111 mmol/L   CO2 30 22 - 32 mmol/L   Glucose, Bld 120 (H) 70 - 99 mg/dL   BUN 38 (H) 8 - 23 mg/dL   Creatinine, Ser 8.18 (H) 0.44 - 1.00 mg/dL   Calcium 8.8 (L) 8.9 - 10.3 mg/dL   Total Protein 7.7 6.5 - 8.1 g/dL   Albumin 3.1 (L) 3.5 - 5.0 g/dL   AST 13 (L) 15 - 41 U/L   ALT 7 0 - 44 U/L   Alkaline Phosphatase 49 38 - 126 U/L   Total Bilirubin 0.7 0.3 - 1.2 mg/dL   GFR calc non Af Amer 4 (L) >60 mL/min   GFR calc Af Amer 5 (L) >60 mL/min   Anion gap 11 5 - 15    Comment: Performed at [facility], [address] [place], [place]  [address]  Respiratory Panel by RT PCR (Flu A&B, Covid) - Nasopharyngeal Swab     Status: None   Collection Time: 06/11/19 11:07 AM   Specimen: Nasopharyngeal Swab  Result Value Ref Range   SARS Coronavirus 2 by RT PCR NEGATIVE NEGATIVE    Comment: (NOTE) SARS-CoV-2 target nucleic acids are NOT DETECTED. The SARS-CoV-2 RNA is generally detectable in upper respiratoy specimens during the acute phase of infection. The lowest concentration of SARS-CoV-2 viral copies this assay can detect is 131 copies/mL. A negative result does not preclude SARS-Cov-2 infection and should not be used as the sole basis for treatment or other  patient management decisions. A negative result may occur with  improper specimen collection/handling, submission of specimen other than nasopharyngeal swab, presence of viral mutation(s) within the areas targeted by this assay, and inadequate number of viral copies (<131 copies/mL). A negative result must be combined with clinical observations, patient history, and epidemiological information. The expected result is Negative. Fact Sheet for Patients:  PinkCheek.be Fact Sheet for Healthcare Providers:  GravelBags.it This test is not yet ap proved or cleared by the [place] FDA and  has been authorized for detection and/or diagnosis of SARS-CoV-2 by FDA under an Emergency Use Authorization (EUA). This EUA will remain  in effect (meaning this test can be used) for the duration of the COVID-19 declaration under Section 564(b)(1) of the Act, 21 U.S.C. section 360bbb-3(b)(1), unless the authorization is terminated or revoked sooner.    Influenza A by PCR NEGATIVE NEGATIVE   Influenza B by PCR NEGATIVE NEGATIVE    Comment: (NOTE) The Xpert Xpress SARS-CoV-2/FLU/RSV assay is intended as an aid in  the diagnosis of influenza from Nasopharyngeal swab specimens and  should not be used as a sole basis for treatment. Nasal washings and  aspirates are unacceptable for Xpert Xpress SARS-CoV-2/FLU/RSV  testing. Fact Sheet for Patients: PinkCheek.be Fact Sheet for Healthcare Providers: GravelBags.it This test is not yet approved or cleared by the [place] FDA and  has been authorized for detection and/or diagnosis of SARS-CoV-2 by  FDA under an Emergency Use Authorization (EUA). This EUA will remain  in effect (meaning this test can be used) for the duration of the  Covid-19 declaration under Section 564(b)(1) of the Act, 21  U.S.C. section 360bbb-3(b)(1), unless the  authorization is  terminated or revoked. Performed at [facility], [address], [place], [place] [address]    CT HEAD WO CONTRAST  Result Date: 06/11/2019 CLINICAL DATA:  Altered mental status with unclear cause EXAM: CT HEAD WITHOUT CONTRAST TECHNIQUE: Contiguous axial images were obtained from the base of the skull through the vertex without intravenous contrast. COMPARISON:  08/13/2017 FINDINGS: Brain: No evidence of acute infarction, hemorrhage, hydrocephalus, extra-axial collection or mass lesion/mass effect. Cerebral volume loss and mild chronic small vessel ischemic type change. Vascular: No hyperdense vessel or unexpected calcification. Skull: Normal. Negative for fracture or focal lesion. Sinuses/Orbits: Left cataract resection IMPRESSION: Senescent changes without acute or reversible finding. Electronically Signed   By: Monte Fantasia M.D.   On: 06/11/2019 10:47    Pending Labs Unresulted Labs (From admission, onward)    Start     Ordered   06/11/19 0814  Urine Culture  Once,   STAT     06/11/19 0813   06/11/19 0813  Urine rapid drug screen (hosp performed)  ONCE - STAT,   STAT     06/11/19 0812   06/11/19 0813  Urinalysis, Routine w reflex microscopic  ONCE - STAT,   STAT     06/11/19 3888   Signed and Held  CBC WITH DIFFERENTIAL  Tomorrow morning,   R     Signed and Held   Signed and Held  Renal function panel  Tomorrow morning,   R     Signed and Held   Signed and Held  TSH  Add-on,   R     Signed and Held   Signed and Held  Renal function panel  Daily,   R     Signed and Held   Signed and Held  Renal function panel  Once,   R     Signed and Held   Signed and Held  CBC  Once,   R     Signed and Held          Vitals/Pain Today's Vitals   06/11/19 1030 06/11/19 1130 06/11/19 1200 06/11/19 1230  BP: (!) 112/57 (!) 119/56 116/69 114/64  Pulse:   81   Resp:  (!) 21 19 (!) 25  Temp:      TempSrc:      SpO2:    100%  Weight:      Height:      PainSc:         Isolation Precautions No active isolations  Medications Medications  Chlorhexidine Gluconate Cloth 2 % PADS 6 each (has no administration in time range)  Darbepoetin Alfa (ARANESP) injection 60 mcg (has no administration in time range)    Mobility non-ambulatory Low fall risk   Focused Assessments    R Recommendations: See Admitting Provider Note  Report given to:   Additional Notes:

## 2019-06-11 NOTE — H&P (Addendum)
History and Physical  [facility]  Alexis [name]:[id number] DOB: [birth year] DOA: 06/11/2019  PCP: System, Provider Not In  Patient coming from: Dialysis Center  I have personally briefly reviewed patient's old medical records in [facility]  Chief Complaint: Altered mentation   HPI: Alexis [name] is a 74 y.o. female with chronic atrial fibrillation on apixaban, end-stage renal disease on hemodialysis TTS, coronary artery disease, type 2 diabetes mellitus, back pain, history of left breast cancer, osteoarthritis, diabetic neuropathy, chronic headaches, stable angina who was sent to the emergency department from her dialysis center today because she was acting altered and confused which is not her normal mentation.  She week and having hallucinations according to family members which is outside of her normal mentation.  She had not been taking her medications correctly in the last 2 days and not eating or drinking well.  She apparently had recently been treated for UTI.    ED Course: Patient arrived somnolent but able to answer some questions but is very confused and having some hallucinations.  CT scan of the head was negative for acute findings.  Her labs were essentially stable.  Her influenza a and B and SARS 2 coronavirus tests were negative.  Ethanol level less than 10.  EKG with chronic atrial fibrillation.  INR was 3.1.  Chest x-ray pending.  Admission was requested for further evaluation and management of encephalopathy.  Review of Systems: Unable to obtain due to patient being encephalopathic.   Past Medical History:  Diagnosis Date  . Arthritis   . Cancer [facility]) 2005    left breast  . CHF (congestive heart failure) (HCC)   . Chronic back pain   . Chronic kidney disease 03/2014   dialysis t/th/sa  . Coronary artery disease   . Diabetes mellitus    Type 2  . Diabetic nephropathy (HCC) 01-08-13  . Dyslipidemia 01-08-13  . ESRD on hemodialysis (HCC)    Tu, Th, Sat    . Headache   . Hyperlipidemia   . Hypertension   . LBBB (left bundle branch block)   . Proteinuria 01-08-13  . Pulmonary hypertension (HCC)    PA peak pressure 73 mmHg 08/05/17 echo  . Thyroid disease 01-08-13   Hyper-parathyroidism-secondary  . Wears glasses     Past Surgical History:  Procedure Laterality Date  . A/V FISTULAGRAM N/A 08/25/2017   Procedure: A/V FISTULAGRAM - Left Arm;  Surgeon: Chuck Hint, MD;  Location[facility] INVASIVE CV LAB;  Service: Cardiovascular;  Laterality: N/A;  . A/V FISTULAGRAM N/A 02/09/2018   Procedure: A/V [id number];  Surgeon: Sherren Kerns, MD;  Location: [facility] INVASIVE CV LAB;  Service: Cardiovascular;  Laterality: N/A;  . A/V FISTULAGRAM Left 04/04/2018   Procedure: A/V FISTULAGRAM;  Surgeon: Cephus Shelling, MD;  Location: [facility] INVASIVE CV LAB;  Service: Cardiovascular;  Laterality: Left;  . ABDOMINAL AORTAGRAM N/A 08/11/2014   Procedure: ABDOMINAL Ronny Flurry;  Surgeon: Chuck Hint, MD;  Location: [facility] CATH LAB;  Service: Cardiovascular;  Laterality: N/A;  . ABDOMINAL HYSTERECTOMY    . APPENDECTOMY    . Arm surgery     Left arm trauma  . AV FISTULA PLACEMENT Left 08/21/2014   Procedure: LEFT ARM ARTERIOVENOUS (AV) FISTULA CREATION ;  Surgeon: Chuck Hint, MD;  Location[facility] OR;  Service: Vascular;  Laterality: Left;  . AV FISTULA PLACEMENT Right 06/13/2018   Procedure: insertion of right arm ARTERIOVENOUS (AV) gore-tex GRAFT;  Surgeon: Larina Earthly,  MD;  Location: [facility] OR;  Service: Vascular;  Laterality: Right;  . BACK SURGERY    . CATARACT EXTRACTION W/PHACO Left 12/22/2014   Procedure: CATARACT EXTRACTION PHACO AND INTRAOCULAR LENS PLACEMENT (IOC);  Surgeon: Gemma Payor, MD;  Location: [facility] ORS;  Service: Ophthalmology;  Laterality: Left;  CDE 13.48  . CORONARY ANGIOPLASTY  12/19/2003   inferior wall hypokinesis. ef 50%  . dialysis catheter    . INSERTION OF DIALYSIS CATHETER Right 06/13/2018   Procedure: INSERTION OF DIALYSIS  CATHETER, right internal jugular;  Surgeon: Larina Earthly, MD;  Location: [facility] OR;  Service: Vascular;  Laterality: Right;  . IR FLUORO GUIDE CV LINE RIGHT  04/06/2018  . IR REMOVAL TUN CV CATH W/O FL  04/25/2018  . IR REMOVAL TUN CV CATH W/O FL  07/20/2018  . IR US GUIDE VASC ACCESS RIGHT  04/06/2018  . LIGATION OF ARTERIOVENOUS  FISTULA Left 03/18/2019   Procedure: LIGATION OF ARTERIOVENOUS  FISTULA  LEFT ARM;  Surgeon: Chuck Hint, MD;  Location: [facility] OR;  Service: Vascular;  Laterality: Left;  Marland Kitchen MASTECTOMY     Left  . PERIPHERAL VASCULAR BALLOON ANGIOPLASTY Left 08/25/2017   Procedure: PERIPHERAL VASCULAR BALLOON ANGIOPLASTY;  Surgeon: Chuck Hint, MD;  Location: [facility] INVASIVE CV LAB;  Service: Cardiovascular;  Laterality: Left;  arm fistula  . PERIPHERAL VASCULAR BALLOON ANGIOPLASTY Left 02/09/2018   Procedure: PERIPHERAL VASCULAR BALLOON ANGIOPLASTY;  Surgeon: Sherren Kerns, MD;  Location: [facility] INVASIVE CV LAB;  Service: Cardiovascular;  Laterality: Left;  AV Fistula  . PERIPHERAL VASCULAR BALLOON ANGIOPLASTY Left 04/04/2018   Procedure: PERIPHERAL VASCULAR BALLOON ANGIOPLASTY;  Surgeon: Cephus Shelling, MD;  Location: [facility] INVASIVE CV LAB;  Service: Cardiovascular;  Laterality: Left;  UPPER ARM FISTULA  . PERIPHERAL VASCULAR CATHETERIZATION N/A 09/16/2015   Procedure: Fistulagram;  Surgeon: Larina Earthly, MD;  Location: [facility] INVASIVE CV LAB;  Service: Cardiovascular;  Laterality: N/A;  . SPINE SURGERY    . TONSILLECTOMY    . TRANSTHORACIC ECHOCARDIOGRAM  10/01/2010    Left ventricle: The cavity size was mildly dilated. Wall thickness was increased in a pattern of mild LVH. Systolic function was   mildly reduced. The estimated ejection fraction was in the range  of 45% to 50%.   . TUBAL LIGATION       reports that she quit smoking about 21 years ago. Her smoking use included cigarettes. She has a 50.00 pack-year smoking history. She has never used smokeless tobacco. She  reports that she does not drink alcohol or use drugs.  Allergies  Allergen Reactions  . Olmesartan Other (See Comments)    Hyperkalemia     Family History  Problem Relation Age of Onset  . Breast cancer Mother        Died age 34  . Cancer Mother 73       Breast  . Heart disease Mother   . Hyperlipidemia Mother   . Hypertension Mother   . Heart attack Mother      Prior to Admission medications   Medication Sig Start Date End Date Taking? Authorizing Provider  acetaminophen (TYLENOL) 325 MG tablet Take 650 mg by mouth every 6 (six) hours as needed for moderate pain.    [provider]  albuterol (VENTOLIN HFA) 108 (90 Base) MCG/ACT inhaler Inhale 1-2 puffs into the lungs every 6 (six) hours as needed for wheezing or shortness of breath.    [provider]  amiodarone (PACERONE) 200 MG tablet Take  1 tablet (200 mg total) by mouth daily. 05/30/19   Strader, Lennart Pall, PA-C  amitriptyline (ELAVIL) 25 MG tablet Take 25 mg by mouth at bedtime.    [provider]  apixaban (ELIQUIS) 5 MG TABS tablet Take 1 tablet (5 mg total) by mouth 2 (two) times daily. 05/30/19   Strader, Lennart Pall, PA-C  atorvastatin (LIPITOR) 80 MG tablet Take 1 tablet (80 mg total) by mouth daily. 05/30/19   Strader, Lennart Pall, PA-C  AURYXIA 1 GM 210 MG(Fe) tablet Take 420 mg by mouth 3 (three) times daily with meals. Take 2 tablets (420 mg) by mouth 3 times daily with meals 07/25/17   [provider]  calcitRIOL (ROCALTROL) 0.25 MCG capsule Take 0.25 mcg by mouth 3 (three) times a week. On Dialysis days Pat Kocher, Sat. 03/26/14   [provider]  Calcium-Vitamin D-Vitamin K 500-500-40 MG-UNT-MCG CHEW Chew 1 tablet by mouth daily.    [provider]  diclofenac sodium (VOLTAREN) 1 % GEL Apply 1 application topically 2 (two) times daily as needed (pain.).     [provider]  docusate sodium (COLACE) 100 MG capsule Take 100 mg by mouth every other day.     [provider]  gabapentin (NEURONTIN) 100 MG capsule Take 200 mg by mouth at bedtime as needed (pain).  01/17/18   [provider]  lidocaine-prilocaine (EMLA) cream Apply 1 application topically daily as needed (prior to port being accessed).     [provider]  midodrine (PROAMATINE) 5 MG tablet Take 1 tablet (5 mg total) by mouth 3 (three) times daily with meals. 05/30/19   Strader, Lennart Pall, PA-C  nitroGLYCERIN (NITROSTAT) 0.4 MG SL tablet Place 0.4 mg under the tongue every 5 (five) minutes as needed for chest pain.    [provider]  polyethylene glycol (MIRALAX / GLYCOLAX) packet Take 17 g by mouth daily as needed for mild constipation.     [provider]  sevelamer carbonate (RENVELA) 800 MG tablet Take 1,600 mg by mouth 3 (three) times daily with meals.    [provider]  tiZANidine (ZANAFLEX) 4 MG tablet Take 4 mg by mouth at bedtime as needed for muscle spasms. 08/22/17   [provider]  triamcinolone cream (KENALOG) 0.5 % Apply 1 application topically 2 (two) times daily as needed (rash).    [provider]    Physical Exam: Vitals:   06/11/19 1200 06/11/19 1230 06/11/19 1300 06/11/19 1330  BP: 116/69 114/64 116/62 (!) 115/53  Pulse: 81     Resp: 19 (!) 25 (!) 21 (!) 26  Temp:      TempSrc:      SpO2:  100%    Weight:      Height:       Constitutional: Patient is somnolent but arousable confused and  encephalopathic Eyes: PERRL, lids and conjunctivae normal ENMT: Mucous membranes are moist. Posterior pharynx clear of any exudate or lesions.  Neck: normal, supple, no masses, no thyromegaly Respiratory: Diminished breath sounds at both bases, no wheezing, no crackles. Normal respiratory effort. No accessory muscle use.  Cardiovascular: Irregularly irregular, no murmurs / rubs / gallops. No extremity edema. 1+ pedal pulses. No carotid bruits.  Abdomen: no tenderness, no masses palpated. No  hepatosplenomegaly. Bowel sounds positive.  Musculoskeletal: no clubbing / cyanosis.  Right Charcot foot Skin: Chronic changes noted especially in the lower extremities. No induration Neurologic: CN 2-12 grossly intact.  Psychiatric: Encephalopathic.   Labs on Admission:  I have personally reviewed following labs and imaging studies  CBC: Recent Labs  Lab 06/11/19 0951  WBC 9.5  NEUTROABS 6.9  HGB 10.4*  HCT 33.2*  MCV 79.2*  PLT 222   Basic Metabolic Panel: Recent Labs  Lab 06/11/19 0951  NA 133*  K 3.9  CL 92*  CO2 30  GLUCOSE 120*  BUN 38*  CREATININE 8.18*  CALCIUM 8.8*   GFR: Estimated Creatinine Clearance: 8.2 mL/min (A) (by C-G formula based on SCr of 8.18 mg/dL (H)). Liver Function Tests: Recent Labs  Lab 06/11/19 0951  AST 13*  ALT 7  ALKPHOS 49  BILITOT 0.7  PROT 7.7  ALBUMIN 3.1*   No results for input(s): LIPASE, AMYLASE in the last 168 hours. No results for input(s): AMMONIA in the last 168 hours. Coagulation Profile: Recent Labs  Lab 06/11/19 0951  INR 3.1*   Cardiac Enzymes: No results for input(s): CKTOTAL, CKMB, CKMBINDEX, TROPONINI in the last 168 hours. BNP (last 3 results) No results for input(s): PROBNP in the last 8760 hours. HbA1C: No results for input(s): HGBA1C in the last 72 hours. CBG: No results for input(s): GLUCAP in the last 168 hours. Lipid Profile: No results for input(s): CHOL, HDL, LDLCALC, TRIG, CHOLHDL, LDLDIRECT in the last 72 hours. Thyroid Function Tests: No results for input(s): TSH, T4TOTAL, FREET4, T3FREE, THYROIDAB in the last 72 hours. Anemia Panel: No results for input(s): VITAMINB12, FOLATE, FERRITIN, TIBC, IRON, RETICCTPCT in the last 72 hours. Urine analysis:    Component Value Date/Time   COLORURINE YELLOW 08/19/2012 1126   APPEARANCEUR CLOUDY (A) 08/19/2012 1126   LABSPEC 1.018 08/19/2012 1126   PHURINE 5.0 08/19/2012 1126   GLUCOSEU NEGATIVE 08/19/2012 1126   HGBUR SMALL (A) 08/19/2012 1126     BILIRUBINUR NEGATIVE 08/19/2012 1126   KETONESUR NEGATIVE 08/19/2012 1126   PROTEINUR >300 (A) 08/19/2012 1126   UROBILINOGEN 0.2 08/19/2012 1126   NITRITE NEGATIVE 08/19/2012 1126   LEUKOCYTESUR SMALL (A) 08/19/2012 1126    Radiological Exams on Admission: CT HEAD WO CONTRAST  Result Date: 06/11/2019 CLINICAL DATA:  Altered mental status with unclear cause EXAM: CT HEAD WITHOUT CONTRAST TECHNIQUE: Contiguous axial images were obtained from the base of the skull through the vertex without intravenous contrast. COMPARISON:  08/13/2017 FINDINGS: Brain: No evidence of acute infarction, hemorrhage, hydrocephalus, extra-axial collection or mass lesion/mass effect. Cerebral volume loss and mild chronic small vessel ischemic type change. Vascular: No hyperdense vessel or unexpected calcification. Skull: Normal. Negative for fracture or focal lesion. Sinuses/Orbits: Left cataract resection IMPRESSION: Senescent changes without acute or reversible finding. Electronically Signed   By: Marnee Spring M.D.   On: 06/11/2019 10:47    EKG: Independently reviewed.  Chronic atrial fibrillation  Assessment/Plan Principal Problem:   Altered mental status Active Problems:   CHF (congestive heart failure) (HCC)   Anemia in chronic kidney disease (CKD)   CAD (coronary artery disease)   Type II diabetes mellitus with manifestations (HCC)   Essential hypertension   ESRD on hemodialysis (HCC)   Pulmonary hypertension (HCC)   GERD (gastroesophageal reflux disease)   Stable angina (HCC)   Generalized weakness   Acute delirium   Hallucinations   1. Acute metabolic encephalopathy-the patient is being admitted for further evaluation and management.  All of her sedative medications are being held.  Neurochecks ordered.  Check ammonia level.  Hemodialysis treatment today.  CT head with no acute findings.  If patient has not been taking apixaban regularly she is at high  risk for CVA.  MRI brain without contrast  ordered.  Obtain neurology consult if does not improve. 2. Chronic atrial fibrillation-patient had good rate control taking amiodarone 200 mg daily.  She is anticoagulated fully with apixaban although were not able to confirm if she has been taking it regularly. 3. ESRD on hemodialysis-patient is due for hemodialysis treatment today.  She will remain on the TTS service.  The patient was already seen by the nephrology service and they are making arrangements for her to receive her treatment today. 4. Acute delirium-unknown cause could be metabolic versus stroke, further work-up in process. 5. Essential hypertension-blood pressures stable and controlled at this time will follow. 6. Chronic diastolic CHF-volume management with hemodialysis. 7. Type 2 diabetes mellitus-patient is not eating well at the moment we will monitor blood glucose and provide sensitive sliding scale coverage if needed.  CBG testing ordered.  No hypoglycemia has been found so far. 8. GERD-Protonix ordered for GI protection. 9. CAD-stable.   DVT prophylaxis: subcut heparin Code Status: Full Family Communication:  Daughter updated by telephone, requests SNF work up Disposition Plan: Inpatient for work-up noted above Consults called: Nephrology Admission status: INP  Shir Bergman MD [facility] How to contact the [facility] Attending or Consulting provider 7A - 7P or covering provider during after hours 7P -7A, for this patient?  1. Check the care team in [facility] and look for a) attending/consulting [facility] provider listed and b) the [facility] team listed 2. Log into [email or web address] and use [facility]'s universal password to access. If you do not have the password, please contact the hospital operator. 3. Locate the [facility] provider you are looking for under [facility] and page to a number that you can be directly reached. 4. If you still have difficulty reaching the provider, please page the [facility] (Director on Call) for the  Hospitalists listed on amion for assistance.   If 7PM-7AM, please contact night-coverage [email or web address] Password [facility]  06/11/2019, 2:28 PM

## 2019-06-11 NOTE — ED Provider Notes (Signed)
[facility] EMERGENCY DEPARTMENT Provider Note   CSN: [id number] Arrival date & time: 06/11/19  0755     History Chief Complaint  Patient presents with  . Altered Mental Status    Alexis [name] is a 74 y.o. female.  HPI   This patient is a 74 year old female, she has a history of breast cancer, congestive heart failure, chronic kidney disease on dialysis Tuesday Thursdays and Saturdays as well as diabetes on insulin.  She presents to the hospital after being seen at her dialysis this morning and found to be generally weak and mildly confused.  There is no corroborating information at this time but the paramedics report that they were told that this was abnormal for her.  The patient is able to answer questions, she is able to follow commands, she seems slightly confused and generally weak but answers questions appropriately, she knows where she is, she knows what day it is, she denies having any pain nausea vomiting shortness of breath but does endorse having generalized weakness which is unusual for her.  There was a report that she may have had a recent urinary tract infection however reports from the husband to the dialysis staff per the paramedics was that she is not taking any of her medicines including her antibiotic that she was prescribed.  She tells me she only urinates occasionally.  Past Medical History:  Diagnosis Date  . Arthritis   . Cancer [facility]) 2005    left breast  . CHF (congestive heart failure) ([place])   . Chronic back pain   . Chronic kidney disease 03/2014   dialysis t/th/sa  . Coronary artery disease   . Diabetes mellitus    Type 2  . Diabetic nephropathy ([place]) 01-08-13  . Dyslipidemia 01-08-13  . ESRD on hemodialysis ([place])    Tu, Th, Sat  . Headache   . Hyperlipidemia   . Hypertension   . LBBB (left bundle branch block)   . Proteinuria 01-08-13  . Pulmonary hypertension ([place])    PA peak pressure 73 mmHg 08/05/17 echo  . Thyroid disease 01-08-13   Hyper-parathyroidism-secondary  . Wears glasses     Patient Active Problem List   Diagnosis Date Noted  . Hypokalemia 05/12/2019  . Hyperlipidemia   . Pulmonary hypertension ([place])   . GERD (gastroesophageal reflux disease)   . Stable angina (HCC)   . Atrial fibrillation with rapid ventricular response ([place]) 05/09/2019  . Elevated troponin   . ESRD on hemodialysis ([place])   . ESRD (end stage renal disease) ([place]) 08/13/2017  . Dyspnea 08/13/2017  . Chest pain 08/04/2017  . 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 Essential hypertension 08/15/2012  . CAD (coronary artery disease) 03/25/2011  . Type II diabetes mellitus with manifestations ([place]) 03/25/2011  . Chest pain 03/25/2011  . CHF (congestive heart failure) ([place]) 10/14/2010  . CKD (chronic kidney disease) 10/14/2010  . Obesity 10/14/2010    Past Surgical History:  Procedure Laterality Date  . A/V FISTULAGRAM N/A 08/25/2017   Procedure: A/V FISTULAGRAM - Left Arm;  Surgeon: Angelia Mould, MD;  Location: [place] [facility];  Service: Cardiovascular;  Laterality: N/A;  . A/V FISTULAGRAM N/A 02/09/2018   Procedure: A/V [id number];  Surgeon: Elam Dutch, MD;  Location: [place] [facility];  Service: Cardiovascular;  Laterality: N/A;  . A/V FISTULAGRAM Left 04/04/2018   Procedure: A/V FISTULAGRAM;  Surgeon: Marty Heck, MD;  Location: [place] [facility];  Service:  Cardiovascular;  Laterality: Left;  . ABDOMINAL AORTAGRAM N/A 08/11/2014   Procedure: ABDOMINAL Maxcine Ham;  Surgeon: Angelia Mould, MD;  Location: [facility] CATH LAB;  Service: Cardiovascular;  Laterality: N/A;  . ABDOMINAL HYSTERECTOMY    . APPENDECTOMY    . Arm surgery     Left arm trauma  . AV FISTULA PLACEMENT Left 08/21/2014   Procedure: LEFT ARM ARTERIOVENOUS (AV) FISTULA CREATION ;  Surgeon: Angelia Mould, MD;  Location: [facility] OR;  Service: Vascular;  Laterality: Left;  . AV FISTULA PLACEMENT Right  06/13/2018   Procedure: insertion of right arm ARTERIOVENOUS (AV) gore-tex GRAFT;  Surgeon: Rosetta Posner, MD;  Location: [place];  Service: Vascular;  Laterality: Right;  . BACK SURGERY    . CATARACT EXTRACTION W/PHACO Left 12/22/2014   Procedure: CATARACT EXTRACTION PHACO AND INTRAOCULAR LENS PLACEMENT (IOC);  Surgeon: Tonny Branch, MD;  Location: [facility];  Service: Ophthalmology;  Laterality: Left;  CDE 13.48  . CORONARY ANGIOPLASTY  12/19/2003   inferior wall hypokinesis. ef 50%  . dialysis catheter    . INSERTION OF DIALYSIS CATHETER Right 06/13/2018   Procedure: INSERTION OF DIALYSIS CATHETER, right internal jugular;  Surgeon: Rosetta Posner, MD;  Location: [place];  Service: Vascular;  Laterality: Right;  . IR FLUORO GUIDE CV LINE RIGHT  04/06/2018  . IR REMOVAL TUN CV CATH W/O FL  04/25/2018  . IR REMOVAL TUN CV CATH W/O FL  07/20/2018  . IR US GUIDE VASC ACCESS RIGHT  04/06/2018  . LIGATION OF ARTERIOVENOUS  FISTULA Left 03/18/2019   Procedure: LIGATION OF ARTERIOVENOUS  FISTULA  LEFT ARM;  Surgeon: Angelia Mould, MD;  Location: [place];  Service: Vascular;  Laterality: Left;  Marland Kitchen MASTECTOMY     Left  . PERIPHERAL VASCULAR BALLOON ANGIOPLASTY Left 08/25/2017   Procedure: PERIPHERAL VASCULAR BALLOON ANGIOPLASTY;  Surgeon: Angelia Mould, MD;  Location: [place] [facility];  Service: Cardiovascular;  Laterality: Left;  arm fistula  . PERIPHERAL VASCULAR BALLOON ANGIOPLASTY Left 02/09/2018   Procedure: PERIPHERAL VASCULAR BALLOON ANGIOPLASTY;  Surgeon: Elam Dutch, MD;  Location: [place] [facility];  Service: Cardiovascular;  Laterality: Left;  AV Fistula  . PERIPHERAL VASCULAR BALLOON ANGIOPLASTY Left 04/04/2018   Procedure: PERIPHERAL VASCULAR BALLOON ANGIOPLASTY;  Surgeon: Marty Heck, MD;  Location: [place] [facility];  Service: Cardiovascular;  Laterality: Left;  UPPER ARM FISTULA  . PERIPHERAL VASCULAR CATHETERIZATION N/A 09/16/2015   Procedure: Fistulagram;  Surgeon: Rosetta Posner, MD;  Location: [place] [facility];  Service: Cardiovascular;  Laterality: N/A;  . SPINE SURGERY    . TONSILLECTOMY    . TRANSTHORACIC ECHOCARDIOGRAM  10/01/2010    Left ventricle: The cavity size was mildly dilated. Wall thickness was increased in a pattern of mild LVH. Systolic function was   mildly reduced. The estimated ejection fraction was in the range  of 45% to 50%.   . TUBAL LIGATION       OB History   No obstetric history on file.     Family History  Problem Relation Age of Onset  . Breast cancer Mother        Died age 23  . Cancer Mother 53       Breast  . Heart disease Mother   . Hyperlipidemia Mother   . Hypertension Mother   . Heart attack Mother     Social History   Tobacco Use  . Smoking status: Former Smoker    Packs/day:  1.00    Years: 50.00    Pack years: 50.00    Types: Cigarettes    Quit date: 05/16/1998    Years since quitting: 21.0  . Smokeless tobacco: Never Used  Substance Use Topics  . Alcohol use: No    Alcohol/week: 0.0 standard drinks  . Drug use: No    Home Medications Prior to Admission medications   Medication Sig Start Date End Date Taking? Authorizing Provider  acetaminophen (TYLENOL) 325 MG tablet Take 650 mg by mouth every 6 (six) hours as needed for moderate pain.    [provider]  albuterol (VENTOLIN HFA) 108 (90 Base) MCG/ACT inhaler Inhale 1-2 puffs into the lungs every 6 (six) hours as needed for wheezing or shortness of breath.    [provider]  amiodarone (PACERONE) 200 MG tablet Take 1 tablet (200 mg total) by mouth daily. 05/30/19   Strader, Fransisco Hertz, PA-C  amitriptyline (ELAVIL) 25 MG tablet Take 25 mg by mouth at bedtime.    [provider]  apixaban (ELIQUIS) 5 MG TABS tablet Take 1 tablet (5 mg total) by mouth 2 (two) times daily. 05/30/19   Strader, Fransisco Hertz, PA-C  atorvastatin (LIPITOR) 80 MG tablet Take 1 tablet (80 mg total) by mouth daily. 05/30/19   Strader, Fransisco Hertz, PA-C    AURYXIA 1 GM 210 MG(Fe) tablet Take 420 mg by mouth 3 (three) times daily with meals. Take 2 tablets (420 mg) by mouth 3 times daily with meals 07/25/17   [provider]  calcitRIOL (ROCALTROL) 0.25 MCG capsule Take 0.25 mcg by mouth 3 (three) times a week. On Dialysis days Harrell Lark, Sat. 03/26/14   [provider]  Calcium-Vitamin D-Vitamin K 500-500-40 MG-UNT-MCG CHEW Chew 1 tablet by mouth daily.    [provider]  diclofenac sodium (VOLTAREN) 1 % GEL Apply 1 application topically 2 (two) times daily as needed (pain.).     [provider]  docusate sodium (COLACE) 100 MG capsule Take 100 mg by mouth every other day.    [provider]  gabapentin (NEURONTIN) 100 MG capsule Take 200 mg by mouth at bedtime as needed (pain).  01/17/18   [provider]  lidocaine-prilocaine (EMLA) cream Apply 1 application topically daily as needed (prior to port being accessed).     [provider]  midodrine (PROAMATINE) 5 MG tablet Take 1 tablet (5 mg total) by mouth 3 (three) times daily with meals. 05/30/19   Strader, Fransisco Hertz, PA-C  nitroGLYCERIN (NITROSTAT) 0.4 MG SL tablet Place 0.4 mg under the tongue every 5 (five) minutes as needed for chest pain.    [provider]  polyethylene glycol (MIRALAX / GLYCOLAX) packet Take 17 g by mouth daily as needed for mild constipation.     [provider]  sevelamer carbonate (RENVELA) 800 MG tablet Take 1,600 mg by mouth 3 (three) times daily with meals.    [provider]  tiZANidine (ZANAFLEX) 4 MG tablet Take 4 mg by mouth at bedtime as needed for muscle spasms. 08/22/17   [provider]  triamcinolone cream (KENALOG) 0.5 % Apply 1 application topically 2 (two) times daily as needed (rash).    [provider]    Allergies    Olmesartan  Review of Systems   Review of Systems  All other systems reviewed and are negative.   Physical Exam Updated Vital  Signs There were no vitals taken for this visit.  Physical Exam Vitals and nursing  note reviewed.  Constitutional:      General: She is not in acute distress.    Appearance: She is well-developed.  HENT:     Head: Normocephalic and atraumatic.     Mouth/Throat:     Pharynx: No oropharyngeal exudate.  Eyes:     General: No scleral icterus.       Right eye: No discharge.        Left eye: No discharge.     Conjunctiva/sclera: Conjunctivae normal.     Pupils: Pupils are equal, round, and reactive to light.  Neck:     Thyroid: No thyromegaly.     Vascular: No JVD.  Cardiovascular:     Rate and Rhythm: Normal rate. Rhythm irregular.     Heart sounds: Normal heart sounds. No murmur. No friction rub. No gallop.      Comments: Slight irregular heart rate, normal rate Pulmonary:     Effort: Pulmonary effort is normal. No respiratory distress.     Breath sounds: Normal breath sounds. No wheezing or rales.  Abdominal:     General: Bowel sounds are normal. There is no distension.     Palpations: Abdomen is soft. There is no mass.     Tenderness: There is no abdominal tenderness.  Musculoskeletal:        General: No tenderness. Normal range of motion.     Cervical back: Normal range of motion and neck supple.  Lymphadenopathy:     Cervical: No cervical adenopathy.  Skin:    General: Skin is warm and dry.     Findings: No erythema or rash.  Neurological:     Mental Status: She is alert.     Coordination: Coordination normal.     Comments: The patient is generally weak, she cannot move herself from the EMS gurney to the ER stretcher.  She is able to lift both legs off the bed for less than 5 seconds with diffuse weakness, she is able to lift both arms for less than 5 seconds with diffuse weakness, she seems symmetrically weak.  Her speech is clear, she answers questions slowly but seems to answer them okay.  She has no obvious facial droop or slurred speech  Psychiatric:        Behavior:  Behavior normal.     ED Results / Procedures / Treatments   Labs (all labs ordered are listed, but only abnormal results are displayed) Labs Reviewed  PROTIME-INR - Abnormal; Notable for the following components:      Result Value   Prothrombin Time 32.1 (*)    INR 3.1 (*)    All other components within normal limits  APTT - Abnormal; Notable for the following components:   aPTT 51 (*)    All other components within normal limits  CBC - Abnormal; Notable for the following components:   Hemoglobin 10.4 (*)    HCT 33.2 (*)    MCV 79.2 (*)    MCH 24.8 (*)    RDW 15.9 (*)    All other components within normal limits  COMPREHENSIVE METABOLIC PANEL - Abnormal; Notable for the following components:   Sodium 133 (*)    Chloride 92 (*)    Glucose, Bld 120 (*)    BUN 38 (*)    Creatinine, Ser 8.18 (*)    Calcium 8.8 (*)    Albumin 3.1 (*)    AST 13 (*)    GFR calc non Af Amer 4 (*)    GFR calc  Af Amer 5 (*)    All other components within normal limits  RESPIRATORY PANEL BY RT PCR (FLU A&B, COVID)  URINE CULTURE  ETHANOL  DIFFERENTIAL  RAPID URINE DRUG SCREEN, HOSP PERFORMED  URINALYSIS, ROUTINE W REFLEX MICROSCOPIC  I-STAT CHEM 8, ED    EKG EKG Interpretation  Date/Time:  Tuesday June 11 2019 08:40:36 EST Ventricular Rate:  84 PR Interval:    QRS Duration: 171 QT Interval:  461 QTC Calculation: 545 R Axis:   -69 Text Interpretation: Atrial fibrillation Ventricular premature complex Left bundle branch block Since last tracing rate slower Confirmed by Noemi Chapel [id number]) on 06/11/2019 10:14:47 AM   Radiology CT HEAD WO CONTRAST  Result Date: 06/11/2019 CLINICAL DATA:  Altered mental status with unclear cause EXAM: CT HEAD WITHOUT CONTRAST TECHNIQUE: Contiguous axial images were obtained from the base of the skull through the vertex without intravenous contrast. COMPARISON:  08/13/2017 FINDINGS: Brain: No evidence of acute infarction, hemorrhage, hydrocephalus,  extra-axial collection or mass lesion/mass effect. Cerebral volume loss and mild chronic small vessel ischemic type change. Vascular: No hyperdense vessel or unexpected calcification. Skull: Normal. Negative for fracture or focal lesion. Sinuses/Orbits: Left cataract resection IMPRESSION: Senescent changes without acute or reversible finding. Electronically Signed   By: Monte Fantasia M.D.   On: 06/11/2019 10:47    Procedures Procedures (including critical care time)  Medications Ordered in ED Medications - No data to display  ED Course  I have reviewed the triage vital signs and the nursing notes.  Pertinent labs & imaging results that were available during my care of the patient were reviewed by me and considered in my medical decision making (see chart for details).    MDM Rules/Calculators/A&P                      This patient has not had dialysis in 3 days, she was due for it this morning, she did not get it because of this apparent weakness and need for evaluation, they activated EMS to come in for possible stroke that the patient is nonfocal.  The husband is not at the bedside, will need additional information, she will need a CT scan and labs, the patient does not seem to have any focal evidence of stroke.  I discussed the case with the daughter who is her primary contact on the demographic sheet, she states she has not seen her mother this morning and does not know what is going on.  I have asked her to come to the hospital to the bedside to help with determining the patient's baseline.  The patient according to the daughter who is now come to see her states that she is not her normal self, she has been having episodes of delirium where she is confused and disoriented and sometimes hallucinating, sometimes she appears more normal.  At this time she does not seem to be back to baseline according to the daughter.  I have reviewed the CT scan no signs of stroke, labs are unremarkable  except for the expected abnormalities including the kidney function.  I discussed the case with Dr. Wynetta Emery of the hospitalist service will admit the patient to the hospital.  Covid neg  MARLIA SCHEWE was evaluated in Emergency Department on 06/11/2019 for the symptoms described in the history of present illness. She was evaluated in the context of the global COVID-19 pandemic, which necessitated consideration that the patient might be at risk for infection with the SARS-CoV-2  virus that causes COVID-19. Institutional protocols and algorithms that pertain to the evaluation of patients at risk for COVID-19 are in a state of rapid change based on information released by regulatory bodies including the CDC and federal and state organizations. These policies and algorithms were followed during the patient's care in the ED.   8:16 AM Cardiac monitoring reveals atrial fibrillation, as reviewed and interpreted by me. Cardiac monitoring was ordered due to altered mental status and to monitor patient for dysrhythmia.   Final Clinical Impression(s) / ED Diagnoses Final diagnoses:  ESRD (end stage renal disease) ([place])  Delirium      Noemi Chapel, MD 06/11/19 1249

## 2019-06-11 NOTE — Consult Note (Signed)
[facility] Renal Consultation Note    Indication for Consultation:  Management of ESRD/hemodialysis; anemia, hypertension/volume and secondary hyperparathyroidism  HPI: Alexis [name] is a 74 y.o. female.  Patient has a PMH significant for obesity, DM, HTN, left breast cancer, ASCVD, combined diastolic and systolic CHF, atrial fibrillation with RVR, and ESRD who presented to [facility] ED from HD with AMS and weakness.  Per the HD clinic, she was "not herself" and her family reports that she has been confused for the past 2 days with hallucinations and that she had not been taking her medications nor eating.  She apparently has been having intermittent and relapsing episodes of confusion and was unable to answer my questions but was able to speak with Dr. Sabra Heck earlier today.  She did not receive any HD today and was sent here directly from the HD unit.  Of note, she had been treated for an UTI per her family even though she does not urinate frequently.  In the ED a CT scan of her head was unremarkable.  We have been consulted to provide HD during her hospitalization.  Past Medical History:  Diagnosis Date  . Arthritis   . Cancer [facility]) 2005    left breast  . CHF (congestive heart failure) ([place])   . Chronic back pain   . Chronic kidney disease 03/2014   dialysis t/th/sa  . Coronary artery disease   . Diabetes mellitus    Type 2  . Diabetic nephropathy ([place]) 01-08-13  . Dyslipidemia 01-08-13  . ESRD on hemodialysis ([place])    Tu, Th, Sat  . Headache   . Hyperlipidemia   . Hypertension   . LBBB (left bundle branch block)   . Proteinuria 01-08-13  . Pulmonary hypertension ([place])    PA peak pressure 73 mmHg 08/05/17 echo  . Thyroid disease 01-08-13   Hyper-parathyroidism-secondary  . Wears glasses    Past Surgical History:  Procedure Laterality Date  . A/V FISTULAGRAM N/A 08/25/2017   Procedure: A/V FISTULAGRAM - Left Arm;  Surgeon: Angelia Mould, MD;  Location: [place] CV LAB;  Service: Cardiovascular;  Laterality: N/A;  . A/V FISTULAGRAM N/A 02/09/2018   Procedure: A/V SEGBTDVVOHY;  Surgeon: Elam Dutch, MD;  Location: [place] CV LAB;  Service: Cardiovascular;  Laterality: N/A;  . A/V FISTULAGRAM Left 04/04/2018   Procedure: A/V FISTULAGRAM;  Surgeon: Marty Heck, MD;  Location: [place] CV LAB;  Service: Cardiovascular;  Laterality: Left;  . ABDOMINAL AORTAGRAM N/A 08/11/2014   Procedure: ABDOMINAL Maxcine Ham;  Surgeon: Angelia Mould, MD;  Location: [facility] CATH LAB;  Service: Cardiovascular;  Laterality: N/A;  . ABDOMINAL HYSTERECTOMY    . APPENDECTOMY    . Arm surgery     Left arm trauma  . AV FISTULA PLACEMENT Left 08/21/2014   Procedure: LEFT ARM ARTERIOVENOUS (AV) FISTULA CREATION ;  Surgeon: Angelia Mould, MD;  Location: [facility] OR;  Service: Vascular;  Laterality: Left;  . AV FISTULA PLACEMENT Right 06/13/2018   Procedure: insertion of right arm ARTERIOVENOUS (AV) gore-tex GRAFT;  Surgeon: Rosetta Posner, MD;  Location: [place];  Service: Vascular;  Laterality: Right;  . BACK SURGERY    . CATARACT EXTRACTION W/PHACO Left 12/22/2014   Procedure: CATARACT EXTRACTION PHACO AND INTRAOCULAR LENS PLACEMENT (IOC);  Surgeon: Tonny Branch, MD;  Location: [facility];  Service: Ophthalmology;  Laterality: Left;  CDE 13.48  . CORONARY ANGIOPLASTY  12/19/2003   inferior wall hypokinesis. ef 50%  .  dialysis catheter    . INSERTION OF DIALYSIS CATHETER Right 06/13/2018   Procedure: INSERTION OF DIALYSIS CATHETER, right internal jugular;  Surgeon: Rosetta Posner, MD;  Location: [place];  Service: Vascular;  Laterality: Right;  . IR FLUORO GUIDE CV LINE RIGHT  04/06/2018  . IR REMOVAL TUN CV CATH W/O FL  04/25/2018  . IR REMOVAL TUN CV CATH W/O FL  07/20/2018  . IR US GUIDE VASC ACCESS RIGHT  04/06/2018  . LIGATION OF ARTERIOVENOUS  FISTULA Left 03/18/2019   Procedure: LIGATION OF ARTERIOVENOUS  FISTULA  LEFT ARM;  Surgeon: Angelia Mould,  MD;  Location: [place];  Service: Vascular;  Laterality: Left;  Marland Kitchen MASTECTOMY     Left  . PERIPHERAL VASCULAR BALLOON ANGIOPLASTY Left 08/25/2017   Procedure: PERIPHERAL VASCULAR BALLOON ANGIOPLASTY;  Surgeon: Angelia Mould, MD;  Location: [place] CV LAB;  Service: Cardiovascular;  Laterality: Left;  arm fistula  . PERIPHERAL VASCULAR BALLOON ANGIOPLASTY Left 02/09/2018   Procedure: PERIPHERAL VASCULAR BALLOON ANGIOPLASTY;  Surgeon: Elam Dutch, MD;  Location: [place] CV LAB;  Service: Cardiovascular;  Laterality: Left;  AV Fistula  . PERIPHERAL VASCULAR BALLOON ANGIOPLASTY Left 04/04/2018   Procedure: PERIPHERAL VASCULAR BALLOON ANGIOPLASTY;  Surgeon: Marty Heck, MD;  Location: [place] CV LAB;  Service: Cardiovascular;  Laterality: Left;  UPPER ARM FISTULA  . PERIPHERAL VASCULAR CATHETERIZATION N/A 09/16/2015   Procedure: Fistulagram;  Surgeon: Rosetta Posner, MD;  Location: [place] CV LAB;  Service: Cardiovascular;  Laterality: N/A;  . SPINE SURGERY    . TONSILLECTOMY    . TRANSTHORACIC ECHOCARDIOGRAM  10/01/2010    Left ventricle: The cavity size was mildly dilated. Wall thickness was increased in a pattern of mild LVH. Systolic function was   mildly reduced. The estimated ejection fraction was in the range  of 45% to 50%.   . TUBAL LIGATION     Family History:   Family History  Problem Relation Age of Onset  . Breast cancer Mother        Died age 74  . Cancer Mother 39       Breast  . Heart disease Mother   . Hyperlipidemia Mother   . Hypertension Mother   . Heart attack Mother    Social History:  reports that she quit smoking about 21 years ago. Her smoking use included cigarettes. She has a 50.00 pack-year smoking history. She has never used smokeless tobacco. She reports that she does not drink alcohol or use drugs. Allergies  Allergen Reactions  . Olmesartan Other (See Comments)    Hyperkalemia    Prior to Admission medications   Medication  Sig Start Date End Date Taking? Authorizing Provider  acetaminophen (TYLENOL) 325 MG tablet Take 650 mg by mouth every 6 (six) hours as needed for moderate pain.    [provider]  albuterol (VENTOLIN HFA) 108 (90 Base) MCG/ACT inhaler Inhale 1-2 puffs into the lungs every 6 (six) hours as needed for wheezing or shortness of breath.    [provider]  amiodarone (PACERONE) 200 MG tablet Take 1 tablet (200 mg total) by mouth daily. 05/30/19   Strader, Fransisco Hertz, PA-C  amitriptyline (ELAVIL) 25 MG tablet Take 25 mg by mouth at bedtime.    [provider]  apixaban (ELIQUIS) 5 MG TABS tablet Take 1 tablet (5 mg total) by mouth 2 (two) times daily. 05/30/19   Strader, Fransisco Hertz, PA-C  atorvastatin (LIPITOR) 80 MG tablet Take  1 tablet (80 mg total) by mouth daily. 05/30/19   Strader, Fransisco Hertz, PA-C  AURYXIA 1 GM 210 MG(Fe) tablet Take 420 mg by mouth 3 (three) times daily with meals. Take 2 tablets (420 mg) by mouth 3 times daily with meals 07/25/17   [provider]  calcitRIOL (ROCALTROL) 0.25 MCG capsule Take 0.25 mcg by mouth 3 (three) times a week. On Dialysis days Harrell Lark, Sat. 03/26/14   [provider]  Calcium-Vitamin D-Vitamin K 500-500-40 MG-UNT-MCG CHEW Chew 1 tablet by mouth daily.    [provider]  diclofenac sodium (VOLTAREN) 1 % GEL Apply 1 application topically 2 (two) times daily as needed (pain.).     [provider]  docusate sodium (COLACE) 100 MG capsule Take 100 mg by mouth every other day.    [provider]  gabapentin (NEURONTIN) 100 MG capsule Take 200 mg by mouth at bedtime as needed (pain).  01/17/18   [provider]  lidocaine-prilocaine (EMLA) cream Apply 1 application topically daily as needed (prior to port being accessed).     [provider]  midodrine (PROAMATINE) 5 MG tablet Take 1 tablet (5 mg total) by mouth 3 (three) times daily with meals. 05/30/19   Strader, Fransisco Hertz,  PA-C  nitroGLYCERIN (NITROSTAT) 0.4 MG SL tablet Place 0.4 mg under the tongue every 5 (five) minutes as needed for chest pain.    [provider]  polyethylene glycol (MIRALAX / GLYCOLAX) packet Take 17 g by mouth daily as needed for mild constipation.     [provider]  sevelamer carbonate (RENVELA) 800 MG tablet Take 1,600 mg by mouth 3 (three) times daily with meals.    [provider]  tiZANidine (ZANAFLEX) 4 MG tablet Take 4 mg by mouth at bedtime as needed for muscle spasms. 08/22/17   [provider]  triamcinolone cream (KENALOG) 0.5 % Apply 1 application topically 2 (two) times daily as needed (rash).    [provider]   Current Facility-Administered Medications  Medication Dose Route Frequency Provider Last Rate Last Admin  . lidocaine (PF) (XYLOCAINE) 1 % injection 0.3 mL  0.3 mL Other Once Magnus Sinning, MD       Current Outpatient Medications  Medication Sig Dispense Refill  . acetaminophen (TYLENOL) 325 MG tablet Take 650 mg by mouth every 6 (six) hours as needed for moderate pain.    Marland Kitchen albuterol (VENTOLIN HFA) 108 (90 Base) MCG/ACT inhaler Inhale 1-2 puffs into the lungs every 6 (six) hours as needed for wheezing or shortness of breath.    Marland Kitchen amiodarone (PACERONE) 200 MG tablet Take 1 tablet (200 mg total) by mouth daily. 90 tablet 3  . amitriptyline (ELAVIL) 25 MG tablet Take 25 mg by mouth at bedtime.    Marland Kitchen apixaban (ELIQUIS) 5 MG TABS tablet Take 1 tablet (5 mg total) by mouth 2 (two) times daily. 60 tablet 11  . atorvastatin (LIPITOR) 80 MG tablet Take 1 tablet (80 mg total) by mouth daily. 90 tablet 3  . AURYXIA 1 GM 210 MG(Fe) tablet Take 420 mg by mouth 3 (three) times daily with meals. Take 2 tablets (420 mg) by mouth 3 times daily with meals  0  . calcitRIOL (ROCALTROL) 0.25 MCG capsule Take 0.25 mcg by mouth 3 (three) times a week. On Dialysis days Tues, Thur, Sat.    . Calcium-Vitamin D-Vitamin K 500-500-40 MG-UNT-MCG CHEW  Chew 1 tablet by mouth daily.    . diclofenac sodium (VOLTAREN) 1 %  GEL Apply 1 application topically 2 (two) times daily as needed (pain.).     Marland Kitchen docusate sodium (COLACE) 100 MG capsule Take 100 mg by mouth every other day.    . gabapentin (NEURONTIN) 100 MG capsule Take 200 mg by mouth at bedtime as needed (pain).   3  . lidocaine-prilocaine (EMLA) cream Apply 1 application topically daily as needed (prior to port being accessed).     . midodrine (PROAMATINE) 5 MG tablet Take 1 tablet (5 mg total) by mouth 3 (three) times daily with meals. 90 tablet 11  . nitroGLYCERIN (NITROSTAT) 0.4 MG SL tablet Place 0.4 mg under the tongue every 5 (five) minutes as needed for chest pain.    . polyethylene glycol (MIRALAX / GLYCOLAX) packet Take 17 g by mouth daily as needed for mild constipation.     . sevelamer carbonate (RENVELA) 800 MG tablet Take 1,600 mg by mouth 3 (three) times daily with meals.    Marland Kitchen tiZANidine (ZANAFLEX) 4 MG tablet Take 4 mg by mouth at bedtime as needed for muscle spasms.  2  . triamcinolone cream (KENALOG) 0.5 % Apply 1 application topically 2 (two) times daily as needed (rash).     Labs: Basic Metabolic Panel: Recent Labs  Lab 06/11/19 0951  NA 133*  K 3.9  CL 92*  CO2 30  GLUCOSE 120*  BUN 38*  CREATININE 8.18*  CALCIUM 8.8*   Liver Function Tests: Recent Labs  Lab 06/11/19 0951  AST 13*  ALT 7  ALKPHOS 49  BILITOT 0.7  PROT 7.7  ALBUMIN 3.1*   No results for input(s): LIPASE, AMYLASE in the last 168 hours. No results for input(s): AMMONIA in the last 168 hours. CBC: Recent Labs  Lab 06/11/19 0951  WBC 9.5  NEUTROABS 6.9  HGB 10.4*  HCT 33.2*  MCV 79.2*  PLT 222   Cardiac Enzymes: No results for input(s): CKTOTAL, CKMB, CKMBINDEX, TROPONINI in the last 168 hours. CBG: No results for input(s): GLUCAP in the last 168 hours. Iron Studies: No results for input(s): IRON, TIBC, TRANSFERRIN, FERRITIN in the last 72 hours. Studies/Results: CT HEAD WO  CONTRAST  Result Date: 06/11/2019 CLINICAL DATA:  Altered mental status with unclear cause EXAM: CT HEAD WITHOUT CONTRAST TECHNIQUE: Contiguous axial images were obtained from the base of the skull through the vertex without intravenous contrast. COMPARISON:  08/13/2017 FINDINGS: Brain: No evidence of acute infarction, hemorrhage, hydrocephalus, extra-axial collection or mass lesion/mass effect. Cerebral volume loss and mild chronic small vessel ischemic type change. Vascular: No hyperdense vessel or unexpected calcification. Skull: Normal. Negative for fracture or focal lesion. Sinuses/Orbits: Left cataract resection IMPRESSION: Senescent changes without acute or reversible finding. Electronically Signed   By: Monte Fantasia M.D.   On: 06/11/2019 10:47    ROS: Review of systems not obtained due to patient factors. Physical Exam: Vitals:   06/11/19 1030 06/11/19 1130 06/11/19 1200 06/11/19 1230  BP: (!) 112/57 (!) 119/56 116/69 114/64  Pulse:   81   Resp:  (!) 21 19 (!) 25  Temp:      TempSrc:      SpO2:    100%  Weight:      Height:          Weight change:  No intake or output data in the 24 hours ending 06/11/19 1302 BP 114/64   Pulse 81   Temp 98.5 F (36.9 C) (Oral)   Resp (!) 25   Ht 5\' 6"  (1.676 m)  Wt 123.8 kg   SpO2 100%   BMI 44.06 kg/m  General appearance: delirious, moderately obese, uncooperative and chronically ill-appearing female Head: Normocephalic, without obvious abnormality, atraumatic Resp: clear to auscultation bilaterally Cardio: irregularly irregular rhythm and no rub GI: soft, non-tender; bowel sounds normal; no masses,  no organomegaly Extremities: extremities normal, atraumatic, no cyanosis or edema and L AVG +T/B Neuro:  Delirious and weak but nonfocal exam Dialysis Access:LAVG  Dialysis Orders: Center: [place]  on TTS . EDW 98kg HD Bath 2K/2.5Ca  Time 4:15 Heparin 2000 units IVP then 1000/hr. Access RAVG BFR 400 DFR 600    Hectoral 4.5 mcg  IV/HD Epogen 4000   Units IV/HD  Venofer  50 mg IV q Tuesday  Other sensipar 30 mg qtx  Assessment/Plan: 1.  AMS and weakness- currently unclear etiology.  Covid test negative as well as CT scan.  Possibly drug induced (gabapentin, zanaflex, or elavil) vs infectious etiology.  Workup per primary team 2.  ESRD -  Continue with HD qTTS and see if she clears after dialysis 3.  Hypertension/volume  - stable 4.  Anemia  - cont with ESA and follow H/H 5.  Metabolic bone disease -   Cont with renal diet and binders if able to take po 6.  Nutrition - renal diet, carb modified when cleared to eat 7. Atrial fibrillation- rate controlled with amiodarone  Donetta Potts, MD [place] Pager [phone] 06/11/2019, 1:02 PM

## 2019-06-11 NOTE — Procedures (Signed)
    HEMODIALYSIS TREATMENT NOTE:  UF limited by hypotension.  Albumin given once with no effect.  3.5 hour treatment completed with SBPs 90-112 .  Net UF 430cc.  All blood returned.  Rockwell Alexandria, RN

## 2019-06-11 NOTE — Consult Note (Signed)
[place] A. Merlene Laughter, MD     [email or web address]          Alexis [name] is an 74 y.o. female.   ASSESSMENT/PLAN: 1. Acute encephalopathy: The patient appears to have a toxic metabolic process /processes to explain the encephalopathy. She does have of couple small stroke involving the right hemisphere but the small size of these seems seems unlikely bottom step to explain the encephalopathy. Additional labs have been ordered. Consider repeat ammonia, blood cultures in chest x-ray. An EEG will also be obtained. 2. Chronic atrial fibrillation 3. Diabetic neuropathy 4. End-stage renal disease 5. Hypertension    This is a 73 year old black female who presents with 2 day history of drowsiness, confusion and unresponsiveness. She does have a history end-stage renal disease and is on chronic hemodialysis. It appears that she has kept up with her dialysis in fact was sent here from the dialysis center because of the confusion and drowsiness. There are some reports that she may not have been taking her medications over last 2 days. P.O. intake also has been reduced. No clear focal deficit. She is encephalopathic and therefore history cannot be obtained.    GENERAL:  This is an obese female who is stuporous but in no acute distress.  HEENT:  The neck is supple no trauma appreciated.  ABDOMEN: Soft  EXTREMITIES: No edema; she has being dialyzed in the right upper extremity.  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  BACK: Normal alignment.  SKIN: Normal by inspection.    MENTAL STATUS:  She lays in bed with eyes closed. She opens her eyes to painful stimuli. She focuses tracks and follows simple commands. She speaks in simple sentences but is not oriented except to herself. There is no dysarthria.  CRANIAL NERVES: Pupils are equal, round and reactive to light; extraocular movements are full, there is no  significant nystagmus; upper and lower facial muscles are normal in strength and symmetric, there is no flattening of the nasolabial folds; tongue is midline; uvula is midline.  MOTOR:  The patient has 4+ strength in the upper extremities. Bulk and tone are normal. Left lower extremity is 4+ and right lower extremity for. Bulk and tone are also normal.  COORDINATION: Left finger to nose is normal, right finger to nose is normal, No rest tremor; no intention tremor; no postural tremor; no bradykinesia.  REFLEXES: Deep tendon reflexes are symmetrical and normal but brisk in the lower extremities.   SENSATION:  She  Response to painful stimuli bilaterally    Blood pressure (!) 122/52, pulse 75, temperature 98 F (36.7 C), temperature source Oral, resp. rate 20, height 5\' 6"  (1.676 m), weight 98.4 kg, SpO2 100 %.  Past Medical History:  Diagnosis Date  . Arthritis   . Cancer [facility]) 2005    left breast  . CHF (congestive heart failure) ([place])   . Chronic back pain   . Chronic kidney disease 03/2014   dialysis t/th/sa  . Coronary artery disease   . Diabetes mellitus    Type 2  . Diabetic nephropathy ([place]) 01-08-13  . Dyslipidemia 01-08-13  . ESRD on hemodialysis ([place])    Tu, Th, Sat  . Headache   . Hyperlipidemia   . Hypertension   . LBBB (left bundle branch block)   . Proteinuria 01-08-13  . Pulmonary hypertension ([place])    PA peak pressure 73 mmHg 08/05/17 echo  . Thyroid disease 01-08-13  Hyper-parathyroidism-secondary  . Wears glasses     Past Surgical History:  Procedure Laterality Date  . A/V FISTULAGRAM N/A 08/25/2017   Procedure: A/V FISTULAGRAM - Left Arm;  Surgeon: Angelia Mould, MD;  Location: [place] [facility];  Service: Cardiovascular;  Laterality: N/A;  . A/V FISTULAGRAM N/A 02/09/2018   Procedure: A/V [id number];  Surgeon: Elam Dutch, MD;  Location: [place] [facility];  Service: Cardiovascular;  Laterality: N/A;  . A/V FISTULAGRAM Left 04/04/2018    Procedure: A/V FISTULAGRAM;  Surgeon: Marty Heck, MD;  Location: [place] [facility];  Service: Cardiovascular;  Laterality: Left;  . ABDOMINAL AORTAGRAM N/A 08/11/2014   Procedure: ABDOMINAL Maxcine Ham;  Surgeon: Angelia Mould, MD;  Location: [facility] C[facility] LAB;  Service: Cardiovascular;  Laterality: N/A;  . ABDOMINAL HYSTERECTOMY    . APPENDECTOMY    . Arm surgery     Left arm trauma  . AV FISTULA PLACEMENT Left 08/21/2014   Procedure: LEFT ARM ARTERIOVENOUS (AV) FISTULA CREATION ;  Surgeon: Angelia Mould, MD;  Location: [facility] OR;  Service: Vascular;  Laterality: Left;  . AV FISTULA PLACEMENT Right 06/13/2018   Procedure: insertion of right arm ARTERIOVENOUS (AV) gore-tex GRAFT;  Surgeon: Rosetta Posner, MD;  Location: [place];  Service: Vascular;  Laterality: Right;  . BACK SURGERY    . CATARACT EXTRACTION W/PHACO Left 12/22/2014   Procedure: CATARACT EXTRACTION PHACO AND INTRAOCULAR LENS PLACEMENT (IOC);  Surgeon: Tonny Branch, MD;  Location: [facility];  Service: Ophthalmology;  Laterality: Left;  CDE 13.48  . CORONARY ANGIOPLASTY  12/19/2003   inferior wall hypokinesis. ef 50%  . dialysis catheter    . INSERTION OF DIALYSIS CATHETER Right 06/13/2018   Procedure: INSERTION OF DIALYSIS CATHETER, right internal jugular;  Surgeon: Rosetta Posner, MD;  Location: [place];  Service: Vascular;  Laterality: Right;  . IR FLUORO GUIDE CV LINE RIGHT  04/06/2018  . IR REMOVAL TUN CV CATH W/O FL  04/25/2018  . IR REMOVAL TUN CV CATH W/O FL  07/20/2018  . IR US GUIDE VASC ACCESS RIGHT  04/06/2018  . LIGATION OF ARTERIOVENOUS  FISTULA Left 03/18/2019   Procedure: LIGATION OF ARTERIOVENOUS  FISTULA  LEFT ARM;  Surgeon: Angelia Mould, MD;  Location: [place];  Service: Vascular;  Laterality: Left;  Marland Kitchen MASTECTOMY     Left  . PERIPHERAL VASCULAR BALLOON ANGIOPLASTY Left 08/25/2017   Procedure: PERIPHERAL VASCULAR BALLOON ANGIOPLASTY;  Surgeon: Angelia Mould, MD;  Location: [place] [facility];   Service: Cardiovascular;  Laterality: Left;  arm fistula  . PERIPHERAL VASCULAR BALLOON ANGIOPLASTY Left 02/09/2018   Procedure: PERIPHERAL VASCULAR BALLOON ANGIOPLASTY;  Surgeon: Elam Dutch, MD;  Location: [place] [facility];  Service: Cardiovascular;  Laterality: Left;  AV Fistula  . PERIPHERAL VASCULAR BALLOON ANGIOPLASTY Left 04/04/2018   Procedure: PERIPHERAL VASCULAR BALLOON ANGIOPLASTY;  Surgeon: Marty Heck, MD;  Location: [place] [facility];  Service: Cardiovascular;  Laterality: Left;  UPPER ARM FISTULA  . PERIPHERAL VASCULAR CATHETERIZATION N/A 09/16/2015   Procedure: Fistulagram;  Surgeon: Rosetta Posner, MD;  Location: [place] [facility];  Service: Cardiovascular;  Laterality: N/A;  . SPINE SURGERY    . TONSILLECTOMY    . TRANSTHORACIC ECHOCARDIOGRAM  10/01/2010    Left ventricle: The cavity size was mildly dilated. Wall thickness was increased in a pattern of mild LVH. Systolic function was   mildly reduced. The estimated ejection fraction was in the range  of 45%  to 50%.   . TUBAL LIGATION      Family History  Problem Relation Age of Onset  . Breast cancer Mother        Died age 33  . Cancer Mother 72       Breast  . Heart disease Mother   . Hyperlipidemia Mother   . Hypertension Mother   . Heart attack Mother     Social History:  reports that she quit smoking about 21 years ago. Her smoking use included cigarettes. She has a 50.00 pack-year smoking history. She has never used smokeless tobacco. She reports that she does not drink alcohol or use drugs.  Allergies:  Allergies  Allergen Reactions  . Olmesartan Other (See Comments)    Hyperkalemia     Medications: Prior to Admission medications   Medication Sig Start Date End Date Taking? Authorizing Provider  acetaminophen (TYLENOL) 325 MG tablet Take 650 mg by mouth every 6 (six) hours as needed for moderate pain.   Yes [provider]  albuterol (VENTOLIN HFA) 108 (90 Base) MCG/ACT inhaler  Inhale 1-2 puffs into the lungs every 6 (six) hours as needed for wheezing or shortness of breath.   Yes [provider]  amiodarone (PACERONE) 200 MG tablet Take 1 tablet (200 mg total) by mouth daily. 05/30/19  Yes Strader, [place] M, PA-C  amitriptyline (ELAVIL) 25 MG tablet Take 25 mg by mouth at bedtime.   Yes [provider]  apixaban (ELIQUIS) 5 MG TABS tablet Take 1 tablet (5 mg total) by mouth 2 (two) times daily. 05/30/19  Yes Strader, [place] M, PA-C  atorvastatin (LIPITOR) 80 MG tablet Take 1 tablet (80 mg total) by mouth daily. 05/30/19  Yes Strader, [place] M, PA-C  midodrine (PROAMATINE) 5 MG tablet Take 1 tablet (5 mg total) by mouth 3 (three) times daily with meals. 05/30/19  Yes Strader, [place] M, PA-C  sevelamer carbonate (RENVELA) 800 MG tablet Take 1,600 mg by mouth 3 (three) times daily with meals.   Yes [provider]  AURYXIA 1 GM 210 MG(Fe) tablet Take 420 mg by mouth 3 (three) times daily with meals. Take 2 tablets (420 mg) by mouth 3 times daily with meals 07/25/17   [provider]  calcitRIOL (ROCALTROL) 0.25 MCG capsule Take 0.25 mcg by mouth 3 (three) times a week. On Dialysis days Harrell Lark, Sat. 03/26/14   [provider]  Calcium-Vitamin D-Vitamin K 500-500-40 MG-UNT-MCG CHEW Chew 1 tablet by mouth daily.    [provider]  ciprofloxacin (CIPRO) 500 MG tablet Take 500 mg by mouth 2 (two) times daily. 05/29/19   [provider]  diclofenac sodium (VOLTAREN) 1 % GEL Apply 1 application topically 2 (two) times daily as needed (pain.).     [provider]  docusate sodium (COLACE) 100 MG capsule Take 100 mg by mouth every other day.    [provider]  gabapentin (NEURONTIN) 100 MG capsule Take 200 mg by mouth at bedtime as needed (pain).  01/17/18   [provider]  lidocaine-prilocaine (EMLA) cream Apply 1 application topically daily as needed (prior to port being accessed).      [provider]  nitroGLYCERIN (NITROSTAT) 0.4 MG SL tablet Place 0.4 mg under the tongue every 5 (five) minutes as needed for chest pain.    [provider]  NOVOLOG MIX 70/30 FLEXPEN (70-30) 100 UNIT/ML FlexPen Inject 55 Units into the skin daily with breakfast.  06/05/19   [provider]  ondansetron (ZOFRAN-ODT) 4 MG disintegrating tablet Take 4 mg by mouth every 6 (six) hours as needed. 05/18/19   [provider]  polyethylene glycol (MIRALAX / GLYCOLAX) packet Take 17 g by mouth daily as needed for mild constipation.     [provider]  tiZANidine (ZANAFLEX) 4 MG tablet Take 4 mg by mouth at bedtime as needed for muscle spasms. 08/22/17   [provider]  triamcinolone cream (KENALOG) 0.5 % Apply 1 application topically 2 (two) times daily as needed (rash).    [provider]    Scheduled Meds: .  stroke: mapping our early stages of recovery book   Does not apply Once  . [START ON 06/12/2019] amiodarone  200 mg Oral Daily  . aspirin  300 mg Rectal Daily   Or  . aspirin  325 mg Oral Daily  . atorvastatin  80 mg Oral q1800  . [START ON 06/12/2019] calcitRIOL  0.25 mcg Oral Once per day on Mon Wed Fri  . [START ON 06/12/2019] Chlorhexidine Gluconate Cloth  6 each Topical Q0600  . darbepoetin (ARANESP) injection - NON-DIALYSIS  60 mcg Subcutaneous Q Tue-1800  . [START ON 06/12/2019] docusate sodium  100 mg Oral QODAY  . [START ON 06/12/2019] ferric citrate  420 mg Oral TID WC  . midodrine  5 mg Oral TID WC  . sevelamer carbonate  1,600 mg Oral TID WC  . sodium chloride flush  3 mL Intravenous Q12H   Continuous Infusions: . sodium chloride     PRN Meds:.sodium chloride, acetaminophen **OR** acetaminophen, albuterol, nitroGLYCERIN, ondansetron **OR** ondansetron (ZOFRAN) IV, polyethylene glycol, senna-docusate, sodium chloride flush     Results for orders placed or performed during the hospital encounter of 06/11/19 (from the  past 48 hour(s))  Ethanol     Status: None   Collection Time: 06/11/19  9:51 AM  Result Value Ref Range   Alcohol, Ethyl (B) <10 <10 mg/dL    Comment: (NOTE) Lowest detectable limit for serum alcohol is 10 mg/dL. For medical purposes only. Performed at [facility], [address], [place], [place] [address]   Protime-INR     Status: Abnormal   Collection Time: 06/11/19  9:51 AM  Result Value Ref Range   Prothrombin Time 32.1 (H) 11.4 - 15.2 seconds   INR 3.1 (H) 0.8 - 1.2    Comment: (NOTE) INR goal varies based on device and disease states. Performed at [facility], [address], [place], [place] [address]   APTT     Status: Abnormal   Collection Time: 06/11/19  9:51 AM  Result Value Ref Range   aPTT 51 (H) 24 - 36 seconds    Comment:        IF BASELINE aPTT IS ELEVATED, SUGGEST PATIENT RISK ASSESSMENT BE USED TO DETERMINE APPROPRIATE ANTICOAGULANT THERAPY. Performed at [facility], [address] [place], [place] [address]   CBC     Status: Abnormal   Collection Time: 06/11/19  9:51 AM  Result Value Ref Range   WBC 9.5 4.0 - 10.5 K/uL   RBC 4.19 3.87 - 5.11 MIL/uL   Hemoglobin 10.4 (L) 12.0 - 15.0 g/dL   HCT 33.2 (L) 36.0 - 46.0 %   MCV 79.2 (L) 80.0 - 100.0 fL   MCH 24.8 (L) 26.0 - 34.0 pg   MCHC 31.3 30.0 - 36.0 g/dL   RDW 15.9 (H) 11.5 - 15.5 %   Platelets 222 150 - 400 K/uL   nRBC 0.0 0.0 -  0.2 %    Comment: Performed at [facility], [address] [place], [place] [address]  Differential     Status: None   Collection Time: 06/11/19  9:51 AM  Result Value Ref Range   Neutrophils Relative % 72 %   Neutro Abs 6.9 1.7 - 7.7 K/uL   Lymphocytes Relative 12 %   Lymphs Abs 1.1 0.7 - 4.0 K/uL   Monocytes Relative 11 %   Monocytes Absolute 1.0 0.1 - 1.0 K/uL   Eosinophils Relative 4 %   Eosinophils Absolute 0.3 0.0 - 0.5 K/uL   Basophils Relative 1 %   Basophils Absolute 0.1 0.0 - 0.1 K/uL   Immature Granulocytes 0 %   Abs Immature Granulocytes 0.04 0.00 -  0.07 K/uL    Comment: Performed at [facility], [address], [place], [place] [address]  Comprehensive metabolic panel     Status: Abnormal   Collection Time: 06/11/19  9:51 AM  Result Value Ref Range   Sodium 133 (L) 135 - 145 mmol/L   Potassium 3.9 3.5 - 5.1 mmol/L   Chloride 92 (L) 98 - 111 mmol/L   CO2 30 22 - 32 mmol/L   Glucose, Bld 120 (H) 70 - 99 mg/dL   BUN 38 (H) 8 - 23 mg/dL   Creatinine, Ser 8.18 (H) 0.44 - 1.00 mg/dL   Calcium 8.8 (L) 8.9 - 10.3 mg/dL   Total Protein 7.7 6.5 - 8.1 g/dL   Albumin 3.1 (L) 3.5 - 5.0 g/dL   AST 13 (L) 15 - 41 U/L   ALT 7 0 - 44 U/L   Alkaline Phosphatase 49 38 - 126 U/L   Total Bilirubin 0.7 0.3 - 1.2 mg/dL   GFR calc non Af Amer 4 (L) >60 mL/min   GFR calc Af Amer 5 (L) >60 mL/min   Anion gap 11 5 - 15    Comment: Performed at [facility], [address] [place], [place] [address]  Respiratory Panel by RT PCR (Flu A&B, Covid) - Nasopharyngeal Swab     Status: None   Collection Time: 06/11/19 11:07 AM   Specimen: Nasopharyngeal Swab  Result Value Ref Range   SARS Coronavirus 2 by RT PCR NEGATIVE NEGATIVE    Comment: (NOTE) SARS-CoV-2 target nucleic acids are NOT DETECTED. The SARS-CoV-2 RNA is generally detectable in upper respiratoy specimens during the acute phase of infection. The lowest concentration of SARS-CoV-2 viral copies this assay can detect is 131 copies/mL. A negative result does not preclude SARS-Cov-2 infection and should not be used as the sole basis for treatment or other patient management decisions. A negative result may occur with  improper specimen collection/handling, submission of specimen other than nasopharyngeal swab, presence of viral mutation(s) within the areas targeted by this assay, and inadequate number of viral copies (<131 copies/mL). A negative result must be combined with clinical observations, patient history, and epidemiological information. The expected result is Negative. Fact Sheet for  Patients:  PinkCheek.be Fact Sheet for Healthcare Providers:  GravelBags.it This test is not yet ap proved or cleared by the [place] FDA and  has been authorized for detection and/or diagnosis of SARS-CoV-2 by FDA under an Emergency Use Authorization (EUA). This EUA will remain  in effect (meaning this test can be used) for the duration of the COVID-19 declaration under Section 564(b)(1) of the Act, 21 U.S.C. section 360bbb-3(b)(1), unless the authorization is terminated or revoked sooner.    Influenza A by PCR NEGATIVE NEGATIVE  Influenza B by PCR NEGATIVE NEGATIVE    Comment: (NOTE) The Xpert Xpress SARS-CoV-2/FLU/RSV assay is intended as an aid in  the diagnosis of influenza from Nasopharyngeal swab specimens and  should not be used as a sole basis for treatment. Nasal washings and  aspirates are unacceptable for Xpert Xpress SARS-CoV-2/FLU/RSV  testing. Fact Sheet for Patients: PinkCheek.be Fact Sheet for Healthcare Providers: GravelBags.it This test is not yet approved or cleared by the [place] FDA and  has been authorized for detection and/or diagnosis of SARS-CoV-2 by  FDA under an Emergency Use Authorization (EUA). This EUA will remain  in effect (meaning this test can be used) for the duration of the  Covid-19 declaration under Section 564(b)(1) of the Act, 21  U.S.C. section 360bbb-3(b)(1), unless the authorization is  terminated or revoked. Performed at [facility], [address], [place], [place] [address]   Ammonia     Status: Abnormal   Collection Time: 06/11/19  3:04 PM  Result Value Ref Range   Ammonia 42 (H) 9 - 35 umol/L    Comment: Performed at [facility], [address] [place], [place] [address]  TSH     Status: None   Collection Time: 06/11/19  3:04 PM  Result Value Ref Range   TSH 1.117 0.350 - 4.500 uIU/mL    Comment:  Performed by a 3rd Generation assay with a functional sensitivity of <=0.01 uIU/mL. Performed at [facility], [address] [place], [place] [address]   Glucose, capillary     Status: Abnormal   Collection Time: 06/11/19  5:17 PM  Result Value Ref Range   Glucose-Capillary 125 (H) 70 - 99 mg/dL    Studies/Results:  HEAD CT FINDINGS: Brain: No evidence of acute infarction, hemorrhage, hydrocephalus, extra-axial collection or mass lesion/mass effect. Cerebral volume loss and mild chronic small vessel ischemic type change.  Vascular: No hyperdense vessel or unexpected calcification.  Skull: Normal. Negative for fracture or focal lesion.  Sinuses/Orbits: Left cataract resection  IMPRESSION: Senescent changes without acute or reversible finding.    BRAIN MRI MRA FINDINGS: MRI HEAD FINDINGS  Brain: There is a small subcentimeter focus of reduced diffusion in the right centrum semiovale. No evidence of hemorrhage. Patchy and mildly confluent T2 hyperintensity in the supratentorial white matter is nonspecific but probably reflects mild to moderate chronic microvascular ischemic changes. Small chronic right cerebellar infarcts. Prominence of the ventricles and sulci reflects generalized parenchymal volume loss.  There is no intracranial mass, mass effect, hydrocephalus, or extra-axial fluid collection.  Vascular: Major vessel flow voids at the skull base are preserved.  Skull and upper cervical spine: No aggressive osseous lesion.  Sinuses/Orbits: Trace paranasal sinus mucosal thickening. Left lens replacement.  Other: Partial left mastoid fluid opacification. Sella is unremarkable.  MRA HEAD FINDINGS  Artifact is present. Intracranial internal carotid arteries are patent. Proximal middle and anterior cerebral arteries are patent where adequately visualized with suboptimal evaluation due to artifact.  Intracranial vertebral arteries, basilar artery,  proximal left posterior cerebral arteries are patent. Right vertebral artery terminates as a PICA. Right posterior cerebral artery is not well seen.  IMPRESSION: Small acute to subacute infarction within the right centrum semiovale.  Mild to moderate chronic microvascular ischemic changes. Chronic cerebellar infarcts.  Motion degraded vascular imaging. Visualized portions of the proximal circulation are patent. The right posterior cerebral artery is not well seen.      The brain MRI scan and MRA were both reviewed in person.  There is a  small deep right frontal infarcts seen on DWI. There is also a smaller acute infarct also seen on DWI involving the right parietal occipital area. There is marked global atrophy. 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 No hemorrhages appreciated and no clear prior infarct is noted. MRA shows significant disease involving the MCAs bilaterally especially the left side. There is loss of signal involving the right PCA and the distal right vertebral.  Salinda Snedeker A. Merlene Laughter, M.D.  Diplomate, Tax adviser of Psychiatry and Neurology ( Neurology). 06/11/2019, 6:08 PM

## 2019-06-11 NOTE — ED Notes (Signed)
Performed bladder scan on patient. Volume found was 0 ml. Patient stated she urinated 30 minutes before arrival.

## 2019-06-12 ENCOUNTER — Inpatient Hospital Stay (HOSPITAL_COMMUNITY)
Admit: 2019-06-12 | Discharge: 2019-06-12 | Disposition: A | Discharge status: Home / Self Care | Payer: Medicare Other | Attending: Neurology | Admitting: Neurology

## 2019-06-12 ENCOUNTER — Inpatient Hospital Stay (HOSPITAL_COMMUNITY): Payer: Medicare Other

## 2019-06-12 ENCOUNTER — Ambulatory Visit: Payer: Medicare Other | Admitting: Family Medicine

## 2019-06-12 DIAGNOSIS — I639 Cerebral infarction, unspecified: Secondary | ICD-10-CM

## 2019-06-12 LAB — CBC WITH DIFFERENTIAL/PLATELET
Abs Immature Granulocytes: 0.03 10*3/uL (ref 0.00–0.07)
Basophils Absolute: 0 10*3/uL (ref 0.0–0.1)
Basophils Relative: 1 %
Eosinophils Absolute: 0.4 10*3/uL (ref 0.0–0.5)
Eosinophils Relative: 6 %
HCT: 30.5 % — ABNORMAL LOW (ref 36.0–46.0)
Hemoglobin: 9.6 g/dL — ABNORMAL LOW (ref 12.0–15.0)
Immature Granulocytes: 1 %
Lymphocytes Relative: 12 %
Lymphs Abs: 0.8 10*3/uL (ref 0.7–4.0)
MCH: 24.9 pg — ABNORMAL LOW (ref 26.0–34.0)
MCHC: 31.5 g/dL (ref 30.0–36.0)
MCV: 79.2 fL — ABNORMAL LOW (ref 80.0–100.0)
Monocytes Absolute: 0.9 10*3/uL (ref 0.1–1.0)
Monocytes Relative: 13 %
Neutro Abs: 4.5 10*3/uL (ref 1.7–7.7)
Neutrophils Relative %: 67 %
Platelets: 200 10*3/uL (ref 150–400)
RBC: 3.85 MIL/uL — ABNORMAL LOW (ref 3.87–5.11)
RDW: 15.8 % — ABNORMAL HIGH (ref 11.5–15.5)
WBC: 6.6 10*3/uL (ref 4.0–10.5)
nRBC: 0 % (ref 0.0–0.2)

## 2019-06-12 LAB — RENAL FUNCTION PANEL
Albumin: 3.1 g/dL — ABNORMAL LOW (ref 3.5–5.0)
Anion gap: 13 (ref 5–15)
BUN: 17 mg/dL (ref 8–23)
CO2: 28 mmol/L (ref 22–32)
Calcium: 8.2 mg/dL — ABNORMAL LOW (ref 8.9–10.3)
Chloride: 92 mmol/L — ABNORMAL LOW (ref 98–111)
Creatinine, Ser: 4.68 mg/dL — ABNORMAL HIGH (ref 0.44–1.00)
GFR calc Af Amer: 10 mL/min — ABNORMAL LOW (ref >60)
GFR calc non Af Amer: 9 mL/min — ABNORMAL LOW (ref >60)
Glucose, Bld: 93 mg/dL (ref 70–99)
Phosphorus: 2.7 mg/dL (ref 2.5–4.6)
Potassium: 3.4 mmol/L — ABNORMAL LOW (ref 3.5–5.1)
Sodium: 133 mmol/L — ABNORMAL LOW (ref 135–145)

## 2019-06-12 LAB — LIPID PANEL
Cholesterol: 62 mg/dL (ref 0–200)
HDL: 22 mg/dL — ABNORMAL LOW (ref >40)
LDL Cholesterol: 27 mg/dL (ref 0–99)
Total CHOL/HDL Ratio: 2.8 RATIO
Triglycerides: 65 mg/dL (ref <150)
VLDL: 13 mg/dL (ref 0–40)

## 2019-06-12 LAB — AMMONIA: Ammonia: 26 umol/L (ref 9–35)

## 2019-06-12 MED ORDER — APIXABAN 2.5 MG PO TABS
2.5000 mg | ORAL_TABLET | Freq: Two times a day (BID) | ORAL | Status: DC
  Administered 2019-06-12 – 2019-06-14 (×6): 2.5 mg via ORAL
  Filled 2019-06-12 (×5): qty 1

## 2019-06-12 MED ORDER — ASPIRIN EC 81 MG PO TBEC
81.0000 mg | DELAYED_RELEASE_TABLET | Freq: Every day | ORAL | Status: DC
  Administered 2019-06-13 – 2019-06-14 (×2): 81 mg via ORAL
  Filled 2019-06-12 (×2): qty 1

## 2019-06-12 NOTE — Progress Notes (Signed)
  Nursing Home Choices, [name] - daughter requesting [facility] or [place] [address] [place], [place] [address] [phone] Overall rating Much above average 2. 0.4 mi [facility] [address] [place], [place] [address] [phone] Overall rating Much below average 3. 10.4 mi [facility] [place], [place] [address] [phone] Overall rating Above average 4. 12.4 mi [place], [place] [address] [phone] Overall rating Average 5. 13.2 mi [facility] [place], [place] [address] [phone] Overall rating Below average 6. 18.1 mi [place] [address] [place], [place] [address] [phone] Overall rating Average 7. 18.2 mi [facility] [address] [place], [place] [address] [phone] Overall rating Below average 8. 18.6 mi [facility] [place], [place] [address] [phone] Overall rating Below average 9. 18.6 mi [facility] [place] [address] [place], [place] [address] [phone] Overall rating Much below average 10. 19.4 mi [facility] [place], [place] [address] [phone] Overall rating Below average 11. 19.6 mi [place] [facility] [place] [place], [place] [address] [phone] Overall rating Much below average 12. 20.2 [place], [place] [address] [phone] Overall rating Much above average 13. 20.4 [place], [place] [address] [phone] Overall rating Much above average 14. 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 21.3 [place] [address] [place] [address] [phone] Overall rating Average

## 2019-06-12 NOTE — NC FL2 (Signed)
[place] LEVEL OF CARE SCREENING TOOL     IDENTIFICATION  Patient Name: Alexis [name] Birthdate: [birth year] Sex: female Admission Date (Current Location): 06/11/2019  [facility] and [place] Number:  [place] and Address:         Provider Number: [id number]  Attending Physician Name and Address:  Rodena Goldmann, DO  Relative Name and Phone Number:  Sherri Rad - daughter  [phone]    Current Level of Care: Hospital Recommended Level of Care: [place] Prior Approval Number:    Date Approved/Denied:   PASRR Number: Pending  Discharge Plan: SNF    Current Diagnoses: Patient Active Problem List   Diagnosis Date Noted  . Altered mental status 06/11/2019  . Generalized weakness 06/11/2019  . Acute delirium 06/11/2019  . Hallucinations 06/11/2019  . Acute CVA (cerebrovascular accident) ([place]) 06/11/2019  . Hypokalemia 05/12/2019  . Hyperlipidemia   . Pulmonary hypertension ([place])   . GERD (gastroesophageal reflux disease)   . Stable angina (HCC)   . Atrial fibrillation with rapid ventricular response ([place]) 05/09/2019  . Elevated troponin   . ESRD on hemodialysis ([place])   . Dyspnea 08/13/2017  . Chest pain 08/04/2017  . 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 Essential hypertension 08/15/2012  . CAD (coronary artery disease) 03/25/2011  . Type II diabetes mellitus with manifestations ([place]) 03/25/2011  . Chest pain 03/25/2011  . CHF (congestive heart failure) ([place]) 10/14/2010  . Anemia in chronic kidney disease (CKD) 10/14/2010  . Obesity 10/14/2010    Orientation RESPIRATION BLADDER Height & Weight     Self, Time, Situation, Place  Normal Incontinent Weight: 98.4 kg Height:  5\' 6"  (167.6 cm)  BEHAVIORAL SYMPTOMS/MOOD NEUROLOGICAL BOWEL NUTRITION STATUS      Continent Diet(See DC summary)  AMBULATORY STATUS COMMUNICATION OF NEEDS Skin   Extensive Assist Verbally Skin  abrasions(right leg)                       Personal Care Assistance Level of Assistance  Bathing, Feeding, Dressing Bathing Assistance: Maximum assistance Feeding assistance: Limited assistance Dressing Assistance: Maximum assistance     Functional Limitations Info  Sight, Hearing, Speech Sight Info: Adequate Hearing Info: Adequate Speech Info: Adequate    SPECIAL CARE FACTORS FREQUENCY  PT (By licensed PT)     PT Frequency: 5 times a week              Contractures Contractures Info: Not present    Additional Factors Info  Code Status, Allergies Code Status Info: FULL Allergies Info: olmesartan           Current Medications (06/12/2019):  This is the current hospital active medication list Current Facility-Administered Medications  Medication Dose Route Frequency Provider Last Rate Last Admin  .  stroke: mapping our early stages of recovery book   Does not apply Once Johnson, Clanford L, MD      . 0.9 %  sodium chloride infusion  250 mL Intravenous PRN Johnson, Clanford L, MD      . 0.9 %  sodium chloride infusion  100 mL Intravenous PRN Donato Heinz, MD      . 0.9 %  sodium chloride infusion  100 mL Intravenous PRN Donato Heinz, MD      . acetaminophen (TYLENOL) tablet 650 mg  650 mg Oral Q6H PRN Johnson, Clanford L, MD       Or  . acetaminophen (TYLENOL) suppository 650  mg  650 mg Rectal Q6H PRN Johnson, Clanford L, MD      . albuterol (PROVENTIL) (2.5 MG/3ML) 0.083% nebulizer solution 2.5 mg  2.5 mg Inhalation Q6H PRN Johnson, Clanford L, MD      . amiodarone (PACERONE) tablet 200 mg  200 mg Oral Daily Johnson, Clanford L, MD   200 mg at 06/12/19 0935  . aspirin suppository 300 mg  300 mg Rectal Daily Johnson, Clanford L, MD       Or  . aspirin tablet 325 mg  325 mg Oral Daily Johnson, Clanford L, MD   325 mg at 06/12/19 0934  . atorvastatin (LIPITOR) tablet 80 mg  80 mg Oral q1800 Johnson, Clanford L, MD      . calcitRIOL (ROCALTROL) capsule  0.25 mcg  0.25 mcg Oral Once per day on Mon Wed Fri Johnson, Clanford L, MD   0.25 mcg at 06/12/19 9417  . Darbepoetin Alfa (ARANESP) injection 60 mcg  60 mcg Subcutaneous Q Tue-1800 Coladonato, Joseph, MD      . docusate sodium (COLACE) capsule 100 mg  100 mg Oral QODAY Johnson, Clanford L, MD   100 mg at 06/12/19 0935  . ferric citrate (AURYXIA) tablet 420 mg  420 mg Oral TID WC Johnson, Clanford L, MD   420 mg at 06/12/19 1205  . heparin injection 2,500 Units  20 Units/kg Dialysis PRN Donato Heinz, MD      . lidocaine (PF) (XYLOCAINE) 1 % injection 5 mL  5 mL Intradermal PRN Donato Heinz, MD      . lidocaine-prilocaine (EMLA) cream 1 application  1 application Topical PRN Donato Heinz, MD      . midodrine (PROAMATINE) tablet 5 mg  5 mg Oral TID WC Johnson, Clanford L, MD   5 mg at 06/12/19 1204  . nitroGLYCERIN (NITROSTAT) SL tablet 0.4 mg  0.4 mg Sublingual Q5 min PRN Johnson, Clanford L, MD      . ondansetron (ZOFRAN) tablet 4 mg  4 mg Oral Q6H PRN Johnson, Clanford L, MD       Or  . ondansetron (ZOFRAN) injection 4 mg  4 mg Intravenous Q6H PRN Johnson, Clanford L, MD      . pentafluoroprop-tetrafluoroeth (GEBAUERS) aerosol 1 application  1 application Topical PRN Donato Heinz, MD      . polyethylene glycol (MIRALAX / GLYCOLAX) packet 17 g  17 g Oral Daily PRN Johnson, Clanford L, MD      . senna-docusate (Senokot-S) tablet 1 tablet  1 tablet Oral QHS PRN Johnson, Clanford L, MD      . sevelamer carbonate (RENVELA) tablet 1,600 mg  1,600 mg Oral TID WC Johnson, Clanford L, MD   1,600 mg at 06/12/19 1205  . sodium chloride flush (NS) 0.9 % injection 3 mL  3 mL Intravenous Q12H Johnson, Clanford L, MD   3 mL at 06/12/19 0938  . sodium chloride flush (NS) 0.9 % injection 3 mL  3 mL Intravenous PRN Murlean Iba, MD         Discharge Medications: Please see discharge summary for a list of discharge medications.  Relevant Imaging Results:  Relevant Lab  Results:   Additional Information SS# [id number]    Dialysis patient  Boneta Lucks, RN

## 2019-06-12 NOTE — Progress Notes (Signed)
Salvo A. Merlene Laughter, MD     [email or web address]          Alexis [name] is an 74 y.o. female.   Assessment/Plan: 1. Acute encephalopathy: The patient appears to have a toxic metabolic process /processes to explain the encephalopathy.   2. Chronic atrial fibrillation 3. Diabetic neuropathy 4. End-stage renal disease 5. Hypertension 6. Small deep white matter infarcts: continue with anticoagulation. No need for aspirin long-term.   She seems more responsive today but still confused. Workup is still mostly unrevealing.    GENERAL:  This is an obese female who is stuporous but in no acute distress.  HEENT:  The neck is supple no trauma appreciated.   EXTREMITIES: No edema; she has being dialyzed in the right upper extremity.  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  BACK: Normal alignment.  SKIN: Normal by inspection.    MENTAL STATUS:  She lays in bed with eyes closed.  She opens her eyes to verbal commands. She does follow commands briskly. She knows the season the hospital but cannot provide history is why she is in the hospital.  CRANIAL NERVES: Pupils are equal, round and reactive to light; extraocular movements are full, there is no significant nystagmus; upper and lower facial muscles are normal in strength and symmetric, there is no flattening of the nasolabial folds; tongue is midline; uvula is midline.  MOTOR:  The patient has 4+ strength in the upper extremities. Bulk and tone are normal. Left lower extremity is 4+ and right lower extremity for. Bulk and tone are also normal.  COORDINATION: Left finger to nose is normal, right finger to nose is normal, No rest tremor; no intention tremor; no postural tremor; no bradykinesia.  SENSATION:  She  Response to painful stimuli bilaterally     EEEG shows mild global slowing.   Objective: Vital signs in last 24  hours: Temp:  [97.5 F (36.4 C)-98.6 F (37 C)] 98.1 F (36.7 C) (01/27 1730) Pulse Rate:  [68-80] 79 (01/27 1730) Resp:  [16-20] 16 (01/27 1730) BP: (101-160)/(51-90) 119/58 (01/27 1730) SpO2:  [93 %-100 %] 100 % (01/27 1600)  Intake/Output from previous day: 01/26 0701 - 01/27 0700 In: -  Out: 430  Intake/Output this shift: No intake/output data recorded. Nutritional status:  Diet Order            Diet renal/carb modified with fluid restriction Diet-HS Snack? Nothing; Fluid restriction: 1200 mL Fluid; Room service appropriate? Yes; Fluid consistency: Thin  Diet effective now               Lab Results: Results for orders placed or performed during the hospital encounter of 06/11/19 (from the past 48 hour(s))  Ethanol     Status: None   Collection Time: 06/11/19  9:51 AM  Result Value Ref Range   Alcohol, Ethyl (B) <10 <10 mg/dL    Comment: (NOTE) Lowest detectable limit for serum alcohol is 10 mg/dL. For medical purposes only. Performed at [facility], [address] [place], [place] [address]   Protime-INR     Status: Abnormal   Collection Time: 06/11/19  9:51 AM  Result Value Ref Range   Prothrombin Time 32.1 (H) 11.4 - 15.2 seconds   INR 3.1 (H) 0.8 - 1.2    Comment: (NOTE) INR goal varies based on device and disease states. Performed at [facility], [address], [place], [place] [address]   APTT  Status: Abnormal   Collection Time: 06/11/19  9:51 AM  Result Value Ref Range   aPTT 51 (H) 24 - 36 seconds    Comment:        IF BASELINE aPTT IS ELEVATED, SUGGEST PATIENT RISK ASSESSMENT BE USED TO DETERMINE APPROPRIATE ANTICOAGULANT THERAPY. Performed at [facility], [address], [place], [place] [address]   CBC     Status: Abnormal   Collection Time: 06/11/19  9:51 AM  Result Value Ref Range   WBC 9.5 4.0 - 10.5 K/uL   RBC 4.19 3.87 - 5.11 MIL/uL   Hemoglobin 10.4 (L) 12.0 - 15.0 g/dL   HCT 33.2 (L) 36.0 - 46.0 %   MCV 79.2 (L) 80.0 - 100.0  fL   MCH 24.8 (L) 26.0 - 34.0 pg   MCHC 31.3 30.0 - 36.0 g/dL   RDW 15.9 (H) 11.5 - 15.5 %   Platelets 222 150 - 400 K/uL   nRBC 0.0 0.0 - 0.2 %    Comment: Performed at [facility], [address], [place], [place] [address]  Differential     Status: None   Collection Time: 06/11/19  9:51 AM  Result Value Ref Range   Neutrophils Relative % 72 %   Neutro Abs 6.9 1.7 - 7.7 K/uL   Lymphocytes Relative 12 %   Lymphs Abs 1.1 0.7 - 4.0 K/uL   Monocytes Relative 11 %   Monocytes Absolute 1.0 0.1 - 1.0 K/uL   Eosinophils Relative 4 %   Eosinophils Absolute 0.3 0.0 - 0.5 K/uL   Basophils Relative 1 %   Basophils Absolute 0.1 0.0 - 0.1 K/uL   Immature Granulocytes 0 %   Abs Immature Granulocytes 0.04 0.00 - 0.07 K/uL    Comment: Performed at [facility], [address], [place], [place] [address]  Comprehensive metabolic panel     Status: Abnormal   Collection Time: 06/11/19  9:51 AM  Result Value Ref Range   Sodium 133 (L) 135 - 145 mmol/L   Potassium 3.9 3.5 - 5.1 mmol/L   Chloride 92 (L) 98 - 111 mmol/L   CO2 30 22 - 32 mmol/L   Glucose, Bld 120 (H) 70 - 99 mg/dL   BUN 38 (H) 8 - 23 mg/dL   Creatinine, Ser 8.18 (H) 0.44 - 1.00 mg/dL   Calcium 8.8 (L) 8.9 - 10.3 mg/dL   Total Protein 7.7 6.5 - 8.1 g/dL   Albumin 3.1 (L) 3.5 - 5.0 g/dL   AST 13 (L) 15 - 41 U/L   ALT 7 0 - 44 U/L   Alkaline Phosphatase 49 38 - 126 U/L   Total Bilirubin 0.7 0.3 - 1.2 mg/dL   GFR calc non Af Amer 4 (L) >60 mL/min   GFR calc Af Amer 5 (L) >60 mL/min   Anion gap 11 5 - 15    Comment: Performed at [facility], [address] [place], [place] [address]  Vitamin B12     Status: None   Collection Time: 06/11/19  9:51 AM  Result Value Ref Range   Vitamin B-12 347 180 - 914 pg/mL    Comment: (NOTE) This assay is not validated for testing neonatal or myeloproliferative syndrome specimens for Vitamin B12 levels. Performed at [facility], [address], [place], [place] [address]   Respiratory Panel  by RT PCR (Flu A&B, Covid) - Nasopharyngeal Swab     Status: None   Collection Time: 06/11/19 11:07 AM   Specimen: Nasopharyngeal Swab  Result Value Ref Range   SARS Coronavirus 2 by RT PCR NEGATIVE NEGATIVE    Comment: (NOTE) SARS-CoV-2 target nucleic acids are NOT DETECTED. The SARS-CoV-2 RNA is generally detectable in upper respiratoy specimens during the acute phase of infection. The lowest concentration of SARS-CoV-2 viral copies this assay can detect is 131 copies/mL. A negative result does not preclude SARS-Cov-2 infection and should not be used as the sole basis for treatment or other patient management decisions. A negative result may occur with  improper specimen collection/handling, submission of specimen other than nasopharyngeal swab, presence of viral mutation(s) within the areas targeted by this assay, and inadequate number of viral copies (<131 copies/mL). A negative result must be combined with clinical observations, patient history, and epidemiological information. The expected result is Negative. Fact Sheet for Patients:  PinkCheek.be Fact Sheet for Healthcare Providers:  GravelBags.it This test is not yet ap proved or cleared by the [place] FDA and  has been authorized for detection and/or diagnosis of SARS-CoV-2 by FDA under an Emergency Use Authorization (EUA). This EUA will remain  in effect (meaning this test can be used) for the duration of the COVID-19 declaration under Section 564(b)(1) of the Act, 21 U.S.C. section 360bbb-3(b)(1), unless the authorization is terminated or revoked sooner.    Influenza A by PCR NEGATIVE NEGATIVE   Influenza B by PCR NEGATIVE NEGATIVE    Comment: (NOTE) The Xpert Xpress SARS-CoV-2/FLU/RSV assay is intended as an aid in  the diagnosis of influenza from Nasopharyngeal swab specimens and  should not be used as a sole basis for treatment. Nasal washings and   aspirates are unacceptable for Xpert Xpress SARS-CoV-2/FLU/RSV  testing. Fact Sheet for Patients: PinkCheek.be Fact Sheet for Healthcare Providers: GravelBags.it This test is not yet approved or cleared by the [place] FDA and  has been authorized for detection and/or diagnosis of SARS-CoV-2 by  FDA under an Emergency Use Authorization (EUA). This EUA will remain  in effect (meaning this test can be used) for the duration of the  Covid-19 declaration under Section 564(b)(1) of the Act, 21  U.S.C. section 360bbb-3(b)(1), unless the authorization is  terminated or revoked. Performed at [facility], [address], [place], [place] [address]   Ammonia     Status: Abnormal   Collection Time: 06/11/19  3:04 PM  Result Value Ref Range   Ammonia 42 (H) 9 - 35 umol/L    Comment: Performed at [facility], [address], [place], [place] [address]  TSH     Status: None   Collection Time: 06/11/19  3:04 PM  Result Value Ref Range   TSH 1.117 0.350 - 4.500 uIU/mL    Comment: Performed by a 3rd Generation assay with a functional sensitivity of <=0.01 uIU/mL. Performed at [facility], [address] [place], [place] [address]   Glucose, capillary     Status: Abnormal   Collection Time: 06/11/19  5:17 PM  Result Value Ref Range   Glucose-Capillary 125 (H) 70 - 99 mg/dL  CBC WITH DIFFERENTIAL     Status: Abnormal   Collection Time: 06/12/19  4:50 AM  Result Value Ref Range   WBC 6.6 4.0 - 10.5 K/uL   RBC 3.85 (L) 3.87 - 5.11 MIL/uL   Hemoglobin 9.6 (L) 12.0 - 15.0 g/dL   HCT 30.5 (L) 36.0 - 46.0 %   MCV 79.2 (L) 80.0 - 100.0 fL   MCH 24.9 (L) 26.0 - 34.0 pg   MCHC 31.5 30.0 - 36.0  g/dL   RDW 15.8 (H) 11.5 - 15.5 %   Platelets 200 150 - 400 K/uL   nRBC 0.0 0.0 - 0.2 %   Neutrophils Relative % 67 %   Neutro Abs 4.5 1.7 - 7.7 K/uL   Lymphocytes Relative 12 %   Lymphs Abs 0.8 0.7 - 4.0 K/uL   Monocytes Relative 13 %    Monocytes Absolute 0.9 0.1 - 1.0 K/uL   Eosinophils Relative 6 %   Eosinophils Absolute 0.4 0.0 - 0.5 K/uL   Basophils Relative 1 %   Basophils Absolute 0.0 0.0 - 0.1 K/uL   Immature Granulocytes 1 %   Abs Immature Granulocytes 0.03 0.00 - 0.07 K/uL    Comment: Performed at [facility], [address], [place], [place] [address]  Renal function panel     Status: Abnormal   Collection Time: 06/12/19  4:50 AM  Result Value Ref Range   Sodium 133 (L) 135 - 145 mmol/L   Potassium 3.4 (L) 3.5 - 5.1 mmol/L   Chloride 92 (L) 98 - 111 mmol/L   CO2 28 22 - 32 mmol/L   Glucose, Bld 93 70 - 99 mg/dL   BUN 17 8 - 23 mg/dL   Creatinine, Ser 4.68 (H) 0.44 - 1.00 mg/dL    Comment: DELTA CHECK NOTED   Calcium 8.2 (L) 8.9 - 10.3 mg/dL   Phosphorus 2.7 2.5 - 4.6 mg/dL   Albumin 3.1 (L) 3.5 - 5.0 g/dL   GFR calc non Af Amer 9 (L) >60 mL/min   GFR calc Af Amer 10 (L) >60 mL/min   Anion gap 13 5 - 15    Comment: Performed at [facility], [address] [place], [place] [address]  Lipid panel     Status: Abnormal   Collection Time: 06/12/19  4:50 AM  Result Value Ref Range   Cholesterol 62 0 - 200 mg/dL   Triglycerides 65 <150 mg/dL   HDL 22 (L) >40 mg/dL   Total CHOL/HDL Ratio 2.8 RATIO   VLDL 13 0 - 40 mg/dL   LDL Cholesterol 27 0 - 99 mg/dL    Comment:        Total Cholesterol/HDL:CHD Risk Coronary Heart Disease Risk Table                     Men   Women  1/2 Average Risk   3.4   3.3  Average Risk       5.0   4.4  2 X Average Risk   9.6   7.1  3 X Average Risk  23.4   11.0        Use the calculated Patient Ratio above and the CHD Risk Table to determine the patient's CHD Risk.        ATP III CLASSIFICATION (LDL):  <100     mg/dL   Optimal  100-129  mg/dL   Near or Above                    Optimal  130-159  mg/dL   Borderline  160-189  mg/dL   High  >190     mg/dL   Very High Performed at [facility], [address], [place], [place] [address]   Ammonia     Status: None    Collection Time: 06/12/19  7:27 AM  Result Value Ref Range   Ammonia 26 9 - 35 umol/L    Comment: Performed at [facility],  [address], [place], [place] [address]    Lipid Panel Recent Labs    06/12/19 0450  CHOL 62  TRIG 65  HDL 22*  CHOLHDL 2.8  VLDL 13  LDLCALC 27    Studies/Results:   Medications:  Scheduled Meds: . amiodarone  200 mg Oral Daily  . apixaban  2.5 mg Oral BID  . [START ON 06/13/2019] aspirin EC  81 mg Oral Daily  . atorvastatin  80 mg Oral q1800  . calcitRIOL  0.25 mcg Oral Once per day on Mon Wed Fri  . darbepoetin (ARANESP) injection - NON-DIALYSIS  60 mcg Subcutaneous Q Tue-1800  . docusate sodium  100 mg Oral QODAY  . ferric citrate  420 mg Oral TID WC  . midodrine  5 mg Oral TID WC  . sevelamer carbonate  1,600 mg Oral TID WC  . sodium chloride flush  3 mL Intravenous Q12H   Continuous Infusions: . sodium chloride    . sodium chloride    . sodium chloride     PRN Meds:.sodium chloride, sodium chloride, sodium chloride, acetaminophen **OR** acetaminophen, albuterol, heparin, lidocaine (PF), lidocaine-prilocaine, nitroGLYCERIN, ondansetron **OR** ondansetron (ZOFRAN) IV, pentafluoroprop-tetrafluoroeth, polyethylene glycol, senna-docusate, sodium chloride flush     LOS: 1 day   Minal Stuller A. Merlene Laughter, M.D.  Diplomate, Tax adviser of Psychiatry and Neurology ( Neurology).

## 2019-06-12 NOTE — Progress Notes (Signed)
EEG completed, results pending. 

## 2019-06-12 NOTE — Evaluation (Signed)
Occupational Therapy Evaluation Patient Details Name: Alexis [name] MRN: [id number] DOB: 08-29-1945 Today's Date: 06/12/2019    History of Present Illness Alexis [name] is a 74 y.o. female with chronic atrial fibrillation on apixaban, end-stage renal disease on hemodialysis TTS, coronary artery disease, type 2 diabetes mellitus, back pain, history of left breast cancer, osteoarthritis, diabetic neuropathy, chronic headaches, stable angina who was sent to the emergency department from her dialysis center today because she was acting altered and confused which is not her normal mentation.  She week and having hallucinations according to family members which is outside of her normal mentation.  She had not been taking her medications correctly in the last 2 days and not eating or drinking well.  She apparently had recently been treated for UTI.     Clinical Impression   Pt awake, alert, agreeable to OT evaluation this am. Pt unable to give clear description of PLOF, first stating she's been walking with a RW, then stating she has been in the bed for a month. Pt requiring significant assistance with ADLs due to poor BUE mobility, as well as being unable to come to standing to perform tasks. Pt is able to perform simple grooming tasks, however is unable to reach for items or reach to head/behind back/etc. Recommend SNF on discharge to improve pt safety and independence in ADL completion.    Follow Up Recommendations  SNF;Supervision/Assistance - 24 hour    Equipment Recommendations  None recommended by OT       Precautions / Restrictions Precautions Precautions: Fall Restrictions Weight Bearing Restrictions: No      Mobility Bed Mobility Overal bed mobility: Needs Assistance Bed Mobility: Supine to Sit;Sit to Supine     Supine to sit: Max assist Sit to supine: Min assist   General bed mobility comments: Increased time, verbal cuing to pull body towards EOB versus trying to put legs  back in bed when attempting to sit up  Transfers                 General transfer comment: Unable to perform         ADL either performed or assessed with clinical judgement   ADL Overall ADL's : Needs assistance/impaired     Grooming: Wash/dry hands;Wash/dry face;Brushing hair;Set up;Moderate assistance;Sitting Grooming Details (indicate cue type and reason): Pt able to reach face for grooming, unable to reach up to hair due to limited BUE ROM         Upper Body Dressing : Maximal assistance;Sitting Upper Body Dressing Details (indicate cue type and reason): Assist for adjusting gown and managing buttons/ties Lower Body Dressing: Maximal assistance;Sitting/lateral leans Lower Body Dressing Details (indicate cue type and reason): pt unable to perform LB dressing due to weakness               General ADL Comments: Pt requiring heavy assistance with ADLs at this time. Unsure of baseline due to cognition and no family available for PLOF information.      Vision Baseline Vision/History: Wears glasses Wears Glasses: Reading only Patient Visual Report: No change from baseline Vision Assessment?: No apparent visual deficits            Pertinent Vitals/Pain Pain Assessment: No/denies pain     Hand Dominance Right   Extremity/Trunk Assessment Upper Extremity Assessment Upper Extremity Assessment: Generalized weakness(ROM limited to 25% in BUE)   Lower Extremity Assessment Lower Extremity Assessment: Defer to PT evaluation       Communication  Communication Communication: No difficulties   Cognition Arousal/Alertness: Awake/alert Behavior During Therapy: WFL for tasks assessed/performed Overall Cognitive Status: No family/caregiver present to determine baseline cognitive functioning                                                Home Living Family/patient expects to be discharged to:: Private residence Living Arrangements:  Spouse/significant other Available Help at Discharge: Family;Available 24 hours/day Type of Home: Apartment Home Access: Level entry     Home Layout: One level     Bathroom Shower/Tub: Teacher, early years/pre: Standard     Home Equipment: Environmental consultant - 4 wheels;Walker - 2 wheels;Wheelchair - manual;Shower seat          Prior Functioning/Environment Level of Independence: Needs assistance  Gait / Transfers Assistance Needed: pt reports using RW/rollator for mobility; also reports she has not been out of bed in one month ADL's / Homemaking Assistance Needed: Husband and family assist with ADLs, aide 3x/week for 4 hours/day to assist with ADLs            OT Problem List: Decreased strength;Decreased range of motion;Decreased activity tolerance;Impaired balance (sitting and/or standing);Decreased safety awareness;Decreased knowledge of use of DME or AE;Impaired UE functional use    End of Session    Activity Tolerance: Patient limited by fatigue Patient left: in bed;with call bell/phone within reach;with bed alarm set  OT Visit Diagnosis: Muscle weakness (generalized) (M62.81)                Time: 1102-1117 OT Time Calculation (min): 23 min Charges:  OT General Charges $OT Visit: 1 Visit OT Evaluation $OT Eval Moderate Complexity: [place], OTR/L  [phone] 06/12/2019, 8:09 AM

## 2019-06-12 NOTE — TOC Initial Note (Signed)
Transition of Care [facility]) - Initial/Assessment Note    Patient Details  Name: Alexis [name] MRN: [id number] Date of Birth: 29-Oct-1945  Transition of Care [facility]) CM/SW Contact:    Boneta Lucks, RN Phone Number: 06/12/2019, 3:35 PM  Clinical Narrative:      Patient admitted for acute CVA.Patient has a high risk for readmission. Patient lives in an apartment with family. PT is recommending SNF.  Spoke with Daughter - [name] she is agreeable, She needs to be stronger before returning home.  She prefers  [facility] if possible, second choices is [facility].  PASSR is pending, uploading documents requested to [place] Must.  TOC to follow for PASSR # and bed offers.           Expected Discharge Plan: Skilled Nursing Facility Barriers to Discharge: Continued Medical Work up, Other (comment)(PASSR pending)   Patient Goals and CMS Choice Patient states their goals for this hospitalization and ongoing recovery are:: to go to rehab then return home. CMS Medicare.gov Compare Post Acute Care list provided to:: Patient Represenative (must comment) Choice offered to / list presented to : Adult Children  Expected Discharge Plan and Services Expected Discharge Plan: [place]       Living arrangements for the past 2 months: Apartment                                      Prior Living Arrangements/Services Living arrangements for the past 2 months: Apartment Lives with:: Relatives Patient language and need for interpreter reviewed:: Yes        Need for Family Participation in Patient Care: Yes (Comment) Care giver support system in place?: Yes (comment)   Criminal Activity/Legal Involvement Pertinent to Current Situation/Hospitalization: No - Comment as needed  Activities of Daily Living Home Assistive Devices/Equipment: Walker (specify type) ADL Screening (condition at time of admission) Patient's cognitive ability adequate to safely complete daily activities?: Yes Is the  patient deaf or have difficulty hearing?: No Does the patient have difficulty seeing, even when wearing glasses/contacts?: No Does the patient have difficulty concentrating, remembering, or making decisions?: Yes Patient able to express need for assistance with ADLs?: Yes Does the patient have difficulty dressing or bathing?: Yes Independently performs ADLs?: No Communication: Independent Dressing (OT): Needs assistance Is this a change from baseline?: Pre-admission baseline Grooming: Needs assistance Is this a change from baseline?: Pre-admission baseline Feeding: Independent Bathing: Needs assistance Is this a change from baseline?: Pre-admission baseline Toileting: Needs assistance Is this a change from baseline?: Pre-admission baseline In/Out Bed: Needs assistance Is this a change from baseline?: Pre-admission baseline Walks in Home: Needs assistance Is this a change from baseline?: Pre-admission baseline Does the patient have difficulty walking or climbing stairs?: Yes Weakness of Legs: Both Weakness of Arms/Hands: None  Permission Sought/Granted      Share Information with NAME: [name]     Permission granted to share info w Relationship: Daughter     Emotional Assessment       Orientation: : Oriented to Self, Oriented to Place, Oriented to Situation Alcohol / Substance Use: Not Applicable Psych Involvement: No (comment)  Admission diagnosis:  Delirium [R41.0] Altered mental status [R41.82] ESRD (end stage renal disease) ([place]) [N18.6] Patient Active Problem List   Diagnosis Date Noted  . Altered mental status 06/11/2019  . Generalized weakness 06/11/2019  . Acute delirium 06/11/2019  . Hallucinations 06/11/2019  . Acute CVA (  cerebrovascular accident) ([place]) 06/11/2019  . Hypokalemia 05/12/2019  . Hyperlipidemia   . Pulmonary hypertension ([place])   . GERD (gastroesophageal reflux disease)   . Stable angina (HCC)   . Atrial fibrillation with rapid ventricular  response ([place]) 05/09/2019  . Elevated troponin   . ESRD on hemodialysis ([place])   . Dyspnea 08/13/2017  . Chest pain 08/04/2017  . 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 Essential hypertension 08/15/2012  . CAD (coronary artery disease) 03/25/2011  . Type II diabetes mellitus with manifestations ([place]) 03/25/2011  . Chest pain 03/25/2011  . CHF (congestive heart failure) ([place]) 10/14/2010  . Anemia in chronic kidney disease (CKD) 10/14/2010  . Obesity 10/14/2010   PCP:  System, Provider Not In Pharmacy:   [facility], [address] [place] [address] Phone: [phone] Fax: [phone]     Social Determinants of Health (SDOH) Interventions    Readmission Risk Interventions Readmission Risk Prevention Plan 06/12/2019  Transportation Screening Complete  PCP or Specialist Appt within 3-5 Days Not Complete  HRI or [place] Complete  Social Work Consult for [place] Planning/Counseling Complete  Palliative Care Screening Not Complete  Medication Review Press photographer) Complete  Some recent data might be hidden

## 2019-06-12 NOTE — Evaluation (Signed)
Physical Therapy Evaluation Patient Details Name: Alexis [name] MRN: [id number] DOB: 01/23/1946 Today's Date: 06/12/2019   History of Present Illness  Alexis [name] is a 74 y.o. female with chronic atrial fibrillation on apixaban, end-stage renal disease on hemodialysis TTS, coronary artery disease, type 2 diabetes mellitus, back pain, history of left breast cancer, osteoarthritis, diabetic neuropathy, chronic headaches, stable angina who was sent to the emergency department from her dialysis center today because she was acting altered and confused which is not her normal mentation.  She week and having hallucinations according to family members which is outside of her normal mentation.  She had not been taking her medications correctly in the last 2 days and not eating or drinking well.  She apparently had recently been treated for UTI.      Clinical Impression  Patient has difficulty sitting up at bedside due to weakness, tends to lean backwards when attempting sit to stands, required verbal/tactile cueing to lean forward, keep feet behind knees to complete sit to stands with Max assist, limited to a few unsteady side steps at bedside due to fall risk and tolerated sitting up in chair after therapy - RN notified.  Patient will benefit from continued physical therapy in hospital and recommended venue below to increase strength, balance, endurance for safe ADLs and gait.    Follow Up Recommendations SNF    Equipment Recommendations  None recommended by PT    Recommendations for Other Services       Precautions / Restrictions Precautions Precautions: Fall Restrictions Weight Bearing Restrictions: No      Mobility  Bed Mobility Overal bed mobility: Needs Assistance Bed Mobility: Supine to Sit     Supine to sit: Max assist     General bed mobility comments: required verbal cues to prop up on elbows for supine to sitting with fair carryover, but limited due to  weakness  Transfers Overall transfer level: Needs assistance   Transfers: Sit to/from Stand;Stand Pivot Transfers Sit to Stand: Mod assist;Max assist Stand pivot transfers: Mod assist       General transfer comment: has difficulty with sit to stands due to BLE weakness  Ambulation/Gait Ambulation/Gait assistance: Mod assist;Max assist Gait Distance (Feet): 5 Feet Assistive device: Rolling walker (2 wheeled) Gait Pattern/deviations: Decreased step length - right;Decreased step length - left;Decreased stride length Gait velocity: slow   General Gait Details: limited to 5-6 slow unsteady labored steps at bedside due to weakness and apprehension  Stairs            Wheelchair Mobility    Modified Rankin (Stroke Patients Only)       Balance Overall balance assessment: Needs assistance Sitting-balance support: Feet supported;No upper extremity supported Sitting balance-Leahy Scale: Fair Sitting balance - Comments: seated at bedside   Standing balance support: During functional activity;Bilateral upper extremity supported Standing balance-Leahy Scale: Poor Standing balance comment: using RW                             Pertinent Vitals/Pain Pain Assessment: No/denies pain    Home Living Family/patient expects to be discharged to:: Private residence Living Arrangements: Spouse/significant other Available Help at Discharge: Family;Available 24 hours/day Type of Home: Apartment Home Access: Level entry     Home Layout: One level Home Equipment: Walker - 4 wheels;Walker - 2 wheels;Wheelchair - manual;Shower seat      Prior Function Level of Independence: Needs assistance   Gait / Transfers  Assistance Needed: pt reports using RW/rollator for mobility; also reports she has not been out of bed in one month  ADL's / Homemaking Assistance Needed: Husband and family assist with ADLs, aide 3x/week for 4 hours/day to assist with ADLs        Hand  Dominance   Dominant Hand: Right    Extremity/Trunk Assessment   Upper Extremity Assessment Upper Extremity Assessment: Defer to OT evaluation    Lower Extremity Assessment Lower Extremity Assessment: Generalized weakness;RLE deficits/detail;LLE deficits/detail RLE Deficits / Details: grossly -4/5 except right ankle dorsiflexion 1/5 mostly due to plantarflexor contractures, baseline old injury per patient LLE Deficits / Details: grossly -4/5 except hip flexors grossly 3/5    Cervical / Trunk Assessment Cervical / Trunk Assessment: Normal  Communication   Communication: No difficulties  Cognition Arousal/Alertness: Awake/alert Behavior During Therapy: WFL for tasks assessed/performed Overall Cognitive Status: No family/caregiver present to determine baseline cognitive functioning                                        General Comments      Exercises     Assessment/Plan    PT Assessment Patient needs continued PT services  PT Problem List Decreased strength;Decreased activity tolerance;Decreased balance;Decreased mobility       PT Treatment Interventions Gait training;Functional mobility training;Therapeutic activities;Therapeutic exercise;Patient/family education    PT Goals (Current goals can be found in the Care Plan section)  Acute Rehab PT Goals Patient Stated Goal: return home after rehab PT Goal Formulation: With patient Time For Goal Achievement: 06/26/19 Potential to Achieve Goals: Good    Frequency Min 3X/week   Barriers to discharge        Co-evaluation               AM-PAC PT "6 Clicks" Mobility  Outcome Measure Help needed turning from your back to your side while in a flat bed without using bedrails?: A Lot Help needed moving from lying on your back to sitting on the side of a flat bed without using bedrails?: A Lot Help needed moving to and from a bed to a chair (including a wheelchair)?: A Lot Help needed standing up from  a chair using your arms (e.g., wheelchair or bedside chair)?: A Lot Help needed to walk in hospital room?: A Lot Help needed climbing 3-5 steps with a railing? : Total 6 Click Score: 11    End of Session Equipment Utilized During Treatment: Gait belt Activity Tolerance: Patient tolerated treatment well;Patient limited by fatigue Patient left: in chair;with call bell/phone within reach;with chair alarm set Nurse Communication: Mobility status PT Visit Diagnosis: Unsteadiness on feet (R26.81);Other abnormalities of gait and mobility (R26.89);Muscle weakness (generalized) (M62.81)    Time: 6270-3500 PT Time Calculation (min) (ACUTE ONLY): 31 min   Charges:   PT Evaluation $PT Eval Moderate Complexity: 1 Mod PT Treatments $Therapeutic Activity: 23-37 mins        2:31 PM, 06/12/19 Lonell Grandchild, MPT Physical Therapist with [facility] [phone] office [phone] mobile phone

## 2019-06-12 NOTE — Progress Notes (Signed)
Blood pressure increased. Patient is more alert and responsive.  ICU rn notified. Will continue to monitor patient.

## 2019-06-12 NOTE — Progress Notes (Signed)
Patient ID: Alexis [name], female   DOB: 1945-07-10, 74 y.o.   MRN: [id number] S: Pt is more awake and alert this am but doesn't remember anything for the past few days. O:BP (!) 118/58 (BP Location: Left Arm)   Pulse 79   Temp 97.7 F (36.5 C) (Oral)   Resp 16   Ht 5\' 6"  (1.676 m)   Wt 98.4 kg   SpO2 98%   BMI 35.01 kg/m   Intake/Output Summary (Last 24 hours) at 06/12/2019 0954 Last data filed at 06/11/2019 2140 Gross per 24 hour  Intake --  Output 430 ml  Net -430 ml   Intake/Output: I/O last 3 completed shifts: In: -  Out: 430 [Other:430]  Intake/Output this shift:  No intake/output data recorded. Weight change:  Gen: NAD CVS: no rub Resp: cta Abd: +BS, soft, Nt/ND Ext: no edema, RAVG +T/B  Recent Labs  Lab 06/11/19 0951 06/12/19 0450  NA 133* 133*  K 3.9 3.4*  CL 92* 92*  CO2 30 28  GLUCOSE 120* 93  BUN 38* 17  CREATININE 8.18* 4.68*  ALBUMIN 3.1* 3.1*  CALCIUM 8.8* 8.2*  PHOS  --  2.7  AST 13*  --   ALT 7  --    Liver Function Tests: Recent Labs  Lab 06/11/19 0951 06/12/19 0450  AST 13*  --   ALT 7  --   ALKPHOS 49  --   BILITOT 0.7  --   PROT 7.7  --   ALBUMIN 3.1* 3.1*   No results for input(s): LIPASE, AMYLASE in the last 168 hours. Recent Labs  Lab 06/11/19 1504 06/12/19 0727  AMMONIA 42* 26   CBC: Recent Labs  Lab 06/11/19 0951 06/12/19 0450  WBC 9.5 6.6  NEUTROABS 6.9 4.5  HGB 10.4* 9.6*  HCT 33.2* 30.5*  MCV 79.2* 79.2*  PLT 222 200   Cardiac Enzymes: No results for input(s): CKTOTAL, CKMB, CKMBINDEX, TROPONINI in the last 168 hours. CBG: Recent Labs  Lab 06/11/19 1717  GLUCAP 125*    Iron Studies: No results for input(s): IRON, TIBC, TRANSFERRIN, FERRITIN in the last 72 hours. Studies/Results: CT HEAD WO CONTRAST  Result Date: 06/11/2019 CLINICAL DATA:  Altered mental status with unclear cause EXAM: CT HEAD WITHOUT CONTRAST TECHNIQUE: Contiguous axial images were obtained from the base of the skull through the  vertex without intravenous contrast. COMPARISON:  08/13/2017 FINDINGS: Brain: No evidence of acute infarction, hemorrhage, hydrocephalus, extra-axial collection or mass lesion/mass effect. Cerebral volume loss and mild chronic small vessel ischemic type change. Vascular: No hyperdense vessel or unexpected calcification. Skull: Normal. Negative for fracture or focal lesion. Sinuses/Orbits: Left cataract resection IMPRESSION: Senescent changes without acute or reversible finding. Electronically Signed   By: Monte Fantasia M.D.   On: 06/11/2019 10:47   MR ANGIO HEAD WO CONTRAST  Result Date: 06/11/2019 CLINICAL DATA:  Altered mental status EXAM: MRI HEAD WITHOUT CONTRAST MRA HEAD WITHOUT CONTRAST TECHNIQUE: Multiplanar, multiecho pulse sequences of the brain and surrounding structures were obtained without intravenous contrast. Angiographic images of the head were obtained using MRA technique without contrast. COMPARISON:  Correlation made with prior CTs FINDINGS: MRI HEAD FINDINGS Brain: There is a small subcentimeter focus of reduced diffusion in the right centrum semiovale. No evidence of hemorrhage. Patchy and mildly confluent T2 hyperintensity in the supratentorial white matter is nonspecific but probably reflects mild to moderate chronic microvascular ischemic changes. Small chronic right cerebellar infarcts. Prominence of the ventricles and sulci reflects generalized parenchymal volume  loss. There is no intracranial mass, mass effect, hydrocephalus, or extra-axial fluid collection. Vascular: Major vessel flow voids at the skull base are preserved. Skull and upper cervical spine: No aggressive osseous lesion. Sinuses/Orbits: Trace paranasal sinus mucosal thickening. Left lens replacement. Other: Partial left mastoid fluid opacification. Sella is unremarkable. MRA HEAD FINDINGS Artifact is present. Intracranial internal carotid arteries are patent. Proximal middle and anterior cerebral arteries are patent  where adequately visualized with suboptimal evaluation due to artifact. Intracranial vertebral arteries, basilar artery, proximal left posterior cerebral arteries are patent. Right vertebral artery terminates as a PICA. Right posterior cerebral artery is not well seen. IMPRESSION: Small acute to subacute infarction within the right centrum semiovale. Mild to moderate chronic microvascular ischemic changes. Chronic cerebellar infarcts. Motion degraded vascular imaging. Visualized portions of the proximal circulation are patent. The right posterior cerebral artery is not well seen. Electronically Signed   By: Macy Mis M.D.   On: 06/11/2019 16:43   MR BRAIN WO CONTRAST  Result Date: 06/11/2019 CLINICAL DATA:  Altered mental status EXAM: MRI HEAD WITHOUT CONTRAST MRA HEAD WITHOUT CONTRAST TECHNIQUE: Multiplanar, multiecho pulse sequences of the brain and surrounding structures were obtained without intravenous contrast. Angiographic images of the head were obtained using MRA technique without contrast. COMPARISON:  Correlation made with prior CTs FINDINGS: MRI HEAD FINDINGS Brain: There is a small subcentimeter focus of reduced diffusion in the right centrum semiovale. No evidence of hemorrhage. Patchy and mildly confluent T2 hyperintensity in the supratentorial white matter is nonspecific but probably reflects mild to moderate chronic microvascular ischemic changes. Small chronic right cerebellar infarcts. Prominence of the ventricles and sulci reflects generalized parenchymal volume loss. There is no intracranial mass, mass effect, hydrocephalus, or extra-axial fluid collection. Vascular: Major vessel flow voids at the skull base are preserved. Skull and upper cervical spine: No aggressive osseous lesion. Sinuses/Orbits: Trace paranasal sinus mucosal thickening. Left lens replacement. Other: Partial left mastoid fluid opacification. Sella is unremarkable. MRA HEAD FINDINGS Artifact is present. Intracranial  internal carotid arteries are patent. Proximal middle and anterior cerebral arteries are patent where adequately visualized with suboptimal evaluation due to artifact. Intracranial vertebral arteries, basilar artery, proximal left posterior cerebral arteries are patent. Right vertebral artery terminates as a PICA. Right posterior cerebral artery is not well seen. IMPRESSION: Small acute to subacute infarction within the right centrum semiovale. Mild to moderate chronic microvascular ischemic changes. Chronic cerebellar infarcts. Motion degraded vascular imaging. Visualized portions of the proximal circulation are patent. The right posterior cerebral artery is not well seen. Electronically Signed   By: Macy Mis M.D.   On: 06/11/2019 16:43   .  stroke: mapping our early stages of recovery book   Does not apply Once  . amiodarone  200 mg Oral Daily  . aspirin  300 mg Rectal Daily   Or  . aspirin  325 mg Oral Daily  . atorvastatin  80 mg Oral q1800  . calcitRIOL  0.25 mcg Oral Once per day on Mon Wed Fri  . darbepoetin (ARANESP) injection - NON-DIALYSIS  60 mcg Subcutaneous Q Tue-1800  . docusate sodium  100 mg Oral QODAY  . ferric citrate  420 mg Oral TID WC  . midodrine  5 mg Oral TID WC  . sevelamer carbonate  1,600 mg Oral TID WC  . sodium chloride flush  3 mL Intravenous Q12H    BMET    Component Value Date/Time   NA 133 (L) 06/12/2019 0450   K 3.4 (L) 06/12/2019  0450   CL 92 (L) 06/12/2019 0450   CO2 28 06/12/2019 0450   GLUCOSE 93 06/12/2019 0450   BUN 17 06/12/2019 0450   CREATININE 4.68 (H) 06/12/2019 0450   CALCIUM 8.2 (L) 06/12/2019 0450   GFRNONAA 9 (L) 06/12/2019 0450   GFRAA 10 (L) 06/12/2019 0450   CBC    Component Value Date/Time   WBC 6.6 06/12/2019 0450   RBC 3.85 (L) 06/12/2019 0450   HGB 9.6 (L) 06/12/2019 0450   HCT 30.5 (L) 06/12/2019 0450   PLT 200 06/12/2019 0450   MCV 79.2 (L) 06/12/2019 0450   MCH 24.9 (L) 06/12/2019 0450   MCHC 31.5 06/12/2019  0450   RDW 15.8 (H) 06/12/2019 0450   LYMPHSABS 0.8 06/12/2019 0450   MONOABS 0.9 06/12/2019 0450   EOSABS 0.4 06/12/2019 0450   BASOSABS 0.0 06/12/2019 0450    Dialysis Orders: Center:[facility]. EDW98kgHD Bath 2K/2.5CaTime 4:15Heparin 2000 units IVP then 1000/hr. AccessRAVGBFR 400DFR 600  Hectoral 4.53mcg IV/HD Epogen 4000Units IV/HD Venofer 50 mg IV q Tuesday Othersensipar 30 mg qtx  Assessment/Plan: 1.  AMS and weakness- currently unclear etiology.  Covid test negative as well as CT scan but MRI with small acute/subacute stroke of right centrum semiovale along with chronic strokes in the cerebellum.  Possibly drug induced (gabapentin, zanaflex, or elavil) vs infectious etiology.  Workup per primary team and Neuro is following.  Much better today.  Continue to follow 2.  ESRD -  Continue with HD qTTS.  She was hypotensive with HD last night but improved afterwards.  Only able to UF 430 ml.  She is mildly above her EDW.   3.  Hypertension/volume  - stable 4. Acute/subacute CVA- small lesion in right centrum semiovale.  Neuro following.   5. Anemia  - cont with ESA and follow H/H 6.  Metabolic bone disease -   Cont with renal diet and binders if able to take po 7.  Nutrition - renal diet, carb modified when cleared to eat 8. Atrial fibrillation- rate controlled with amiodarone Donetta Potts, MD [facility] [phone]

## 2019-06-12 NOTE — Evaluation (Signed)
Speech Language Pathology Evaluation Patient Details Name: Alexis [name] MRN: [id number] DOB: Aug 19, 1945 Today's Date: 06/12/2019 Time: 1914-7829 SLP Time Calculation (min) (ACUTE ONLY): 24 min  Problem List:  Patient Active Problem List   Diagnosis Date Noted  . Altered mental status 06/11/2019  . Generalized weakness 06/11/2019  . Acute delirium 06/11/2019  . Hallucinations 06/11/2019  . Acute CVA (cerebrovascular accident) (HCC) 06/11/2019  . Hypokalemia 05/12/2019  . Hyperlipidemia   . Pulmonary hypertension (HCC)   . GERD (gastroesophageal reflux disease)   . Stable angina (HCC)   . Atrial fibrillation with rapid ventricular response (HCC) 05/09/2019  . Elevated troponin   . ESRD on hemodialysis (HCC)   . Dyspnea 08/13/2017  . Chest pain 08/04/2017  . 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 Essential hypertension 08/15/2012  . CAD (coronary artery disease) 03/25/2011  . Type II diabetes mellitus with manifestations (HCC) 03/25/2011  . Chest pain 03/25/2011  . CHF (congestive heart failure) (HCC) 10/14/2010  . Anemia in chronic kidney disease (CKD) 10/14/2010  . Obesity 10/14/2010   Past Medical History:  Past Medical History:  Diagnosis Date  . Arthritis   . Cancer [facility]) 2005    left breast  . CHF (congestive heart failure) (HCC)   . Chronic back pain   . Chronic kidney disease 03/2014   dialysis t/th/sa  . Coronary artery disease   . Diabetes mellitus    Type 2  . Diabetic nephropathy (HCC) 01-08-13  . Dyslipidemia 01-08-13  . ESRD on hemodialysis (HCC)    Tu, Th, Sat  . Headache   . Hyperlipidemia   . Hypertension   . LBBB (left bundle branch block)   . Proteinuria 01-08-13  . Pulmonary hypertension (HCC)    PA peak pressure 73 mmHg 08/05/17 echo  . Thyroid disease 01-08-13   Hyper-parathyroidism-secondary  . Wears glasses    Past Surgical History:  Past Surgical History:  Procedure Laterality Date  . A/V  FISTULAGRAM N/A 08/25/2017   Procedure: A/V FISTULAGRAM - Left Arm;  Surgeon: Chuck Hint, MD;  Location: [facility] INVASIVE CV LAB;  Service: Cardiovascular;  Laterality: N/A;  . A/V FISTULAGRAM N/A 02/09/2018   Procedure: A/V [id number];  Surgeon: Sherren Kerns, MD;  Location: [facility] INVASIVE CV LAB;  Service: Cardiovascular;  Laterality: N/A;  . A/V FISTULAGRAM Left 04/04/2018   Procedure: A/V FISTULAGRAM;  Surgeon: Cephus Shelling, MD;  Location: [facility] INVASIVE CV LAB;  Service: Cardiovascular;  Laterality: Left;  . ABDOMINAL AORTAGRAM N/A 08/11/2014   Procedure: ABDOMINAL Ronny Flurry;  Surgeon: Chuck Hint, MD;  Location: [facility] CATH LAB;  Service: Cardiovascular;  Laterality: N/A;  . ABDOMINAL HYSTERECTOMY    . APPENDECTOMY    . Arm surgery     Left arm trauma  . AV FISTULA PLACEMENT Left 08/21/2014   Procedure: LEFT ARM ARTERIOVENOUS (AV) FISTULA CREATION ;  Surgeon: Chuck Hint, MD;  Location: [facility] OR;  Service: Vascular;  Laterality: Left;  . AV FISTULA PLACEMENT Right 06/13/2018   Procedure: insertion of right arm ARTERIOVENOUS (AV) gore-tex GRAFT;  Surgeon: Larina Earthly, MD;  Location: [facility] OR;  Service: Vascular;  Laterality: Right;  . BACK SURGERY    . CATARACT EXTRACTION W/PHACO Left 12/22/2014   Procedure: CATARACT EXTRACTION PHACO AND INTRAOCULAR LENS PLACEMENT (IOC);  Surgeon: Gemma Payor, MD;  Location: [facility] ORS;  Service: Ophthalmology;  Laterality: Left;  CDE 13.48  . CORONARY ANGIOPLASTY  12/19/2003   inferior wall hypokinesis. ef 50%  .  dialysis catheter    . INSERTION OF DIALYSIS CATHETER Right 06/13/2018   Procedure: INSERTION OF DIALYSIS CATHETER, right internal jugular;  Surgeon: Larina Earthly, MD;  Location: [facility] OR;  Service: Vascular;  Laterality: Right;  . IR FLUORO GUIDE CV LINE RIGHT  04/06/2018  . IR REMOVAL TUN CV CATH W/O FL  04/25/2018  . IR REMOVAL TUN CV CATH W/O FL  07/20/2018  . IR US GUIDE VASC ACCESS RIGHT  04/06/2018  . LIGATION OF ARTERIOVENOUS   FISTULA Left 03/18/2019   Procedure: LIGATION OF ARTERIOVENOUS  FISTULA  LEFT ARM;  Surgeon: Chuck Hint, MD;  Location: [facility] OR;  Service: Vascular;  Laterality: Left;  Marland Kitchen MASTECTOMY     Left  . PERIPHERAL VASCULAR BALLOON ANGIOPLASTY Left 08/25/2017   Procedure: PERIPHERAL VASCULAR BALLOON ANGIOPLASTY;  Surgeon: Chuck Hint, MD;  Location: [facility] INVASIVE CV LAB;  Service: Cardiovascular;  Laterality: Left;  arm fistula  . PERIPHERAL VASCULAR BALLOON ANGIOPLASTY Left 02/09/2018   Procedure: PERIPHERAL VASCULAR BALLOON ANGIOPLASTY;  Surgeon: Sherren Kerns, MD;  Location: [facility] INVASIVE CV LAB;  Service: Cardiovascular;  Laterality: Left;  AV Fistula  . PERIPHERAL VASCULAR BALLOON ANGIOPLASTY Left 04/04/2018   Procedure: PERIPHERAL VASCULAR BALLOON ANGIOPLASTY;  Surgeon: Cephus Shelling, MD;  Location: [facility] INVASIVE CV LAB;  Service: Cardiovascular;  Laterality: Left;  UPPER ARM FISTULA  . PERIPHERAL VASCULAR CATHETERIZATION N/A 09/16/2015   Procedure: Fistulagram;  Surgeon: Larina Earthly, MD;  Location: [facility] INVASIVE CV LAB;  Service: Cardiovascular;  Laterality: N/A;  . SPINE SURGERY    . TONSILLECTOMY    . TRANSTHORACIC ECHOCARDIOGRAM  10/01/2010    Left ventricle: The cavity size was mildly dilated. Wall thickness was increased in a pattern of mild LVH. Systolic function was   mildly reduced. The estimated ejection fraction was in the range  of 45% to 50%.   . TUBAL LIGATION     HPI:  Alexis [name] is a 74 y.o. female with chronic atrial fibrillation on apixaban, end-stage renal disease on hemodialysis TTS, coronary artery disease, type 2 diabetes mellitus, back pain, history of left breast cancer, osteoarthritis, diabetic neuropathy, chronic headaches, stable angina who was sent to the emergency department from her dialysis center today because she was acting altered and confused which is not her normal mentation.  She week and having hallucinations according to family members  which is outside of her normal mentation.  She had not been taking her medications correctly in the last 2 days and not eating or drinking well.  She apparently had recently been treated for UTI.   Assessment / Plan / Recommendation Clinical Impression  Pt was adminsitered the MoCA assessment to evaluate her cognition; Pt achieved a 6/30 which translates to severe. Note unsure of Pt's cognitive baseline.  Visuospatial skills were not intact and memory was severely impaired. She was able to name all basic objects in her room, was oriented to self and able to tell me about her family but with some open ended questions her responses seemed to be somewhat broken and inaccurate. Pt's speech was seemingly intact for tasks assessed during evaluation. Pt will benefit from more in depth ST cognitive assessment at subsequent location, SNF or HH. Pt does report that in the past two years her husband pays all the bills and "takes care of everything" including ensuring she takes her correct meds. It will be very important that these support systems remain in place as Pt returns home. All further ST  needs can be met at subsequent location, thank you for this referral.     SLP Assessment  SLP Recommendation/Assessment: All further Speech Lanaguage Pathology  needs can be addressed in the next venue of care SLP Visit Diagnosis: Cognitive communication deficit (R41.841)    Follow Up Recommendations  24 hour supervision/assistance;Skilled Nursing facility          SLP Evaluation Cognition  Overall Cognitive Status: No family/caregiver present to determine baseline cognitive functioning Orientation Level: Oriented to person;Disoriented to time;Oriented to place;Oriented to situation Memory: Impaired Awareness: Impaired Problem Solving: Impaired Safety/Judgment: Impaired       Comprehension  Auditory Comprehension Overall Auditory Comprehension: Appears within functional limits for tasks assessed Visual  Recognition/Discrimination Discrimination: Within Function Limits    Expression Expression Primary Mode of Expression: Verbal Verbal Expression Overall Verbal Expression: Appears within functional limits for tasks assessed Written Expression Dominant Hand: Right   Oral / Motor  Oral Motor/Sensory Function Overall Oral Motor/Sensory Function: Within functional limits Motor Speech Overall Motor Speech: Appears within functional limits for tasks assessed   Chantz Montefusco H. Romie Levee, CCC-SLP Speech Language Pathologist  Georgetta Haber 06/12/2019, 5:12 PM

## 2019-06-12 NOTE — Progress Notes (Signed)
PROGRESS NOTE    Alexis [name]  OZD:[id number] DOB: 1945/09/17 DOA: 06/11/2019 PCP: System, Provider Not In   Brief Narrative:  Per HPI: Alexis [name] is a 74 y.o. female with chronic atrial fibrillation on apixaban, end-stage renal disease on hemodialysis TTS, coronary artery disease, type 2 diabetes mellitus, back pain, history of left breast cancer, osteoarthritis, diabetic neuropathy, chronic headaches, stable angina who was sent to the emergency department from her dialysis center today because she was acting altered and confused which is not her normal mentation.  She week and having hallucinations according to family members which is outside of her normal mentation.  She had not been taking her medications correctly in the last 2 days and not eating or drinking well.  She apparently had recently been treated for UTI.    1/27: Patient admitted with suspicion of acute metabolic encephalopathy in setting of polypharmacy.  EEG currently pending.  Brain MRI with acute/subacute CVA of right centrum ovale noted.  Ongoing neurology evaluation appreciated.  SNF placement pending.  Appears more awake and alert today.  Assessment & Plan:   Principal Problem:   Acute CVA (cerebrovascular accident) (HCC) Active Problems:   CHF (congestive heart failure) (HCC)   Anemia in chronic kidney disease (CKD)   CAD (coronary artery disease)   Type II diabetes mellitus with manifestations (HCC)   Essential hypertension   ESRD on hemodialysis (HCC)   Pulmonary hypertension (HCC)   GERD (gastroesophageal reflux disease)   Stable angina (HCC)   Altered mental status   Generalized weakness   Acute delirium   Hallucinations   1. Acute metabolic encephalopathy-the patient is being admitted for further evaluation and management.  All of her sedative medications are being held.    Ammonia level noted to be within normal limits at 26 this morning.  TSH also 1.1.  This is likely related to polypharmacy as  she uses Elavil, gabapentin, and tizanidine. 2. Acute/subacute right centrum semiovale CVA.  Carotid ultrasound pending along with EEG.  Uncertain compliance with Eliquis.  Appreciate neurology ongoing evaluation.  OT evaluation recommending SNF and will plan for SNF placement on discharge. 3. Chronic atrial fibrillation-patient had good rate control taking amiodarone 200 mg daily.  She is anticoagulated fully with apixaban although were not able to confirm if she has been taking it regularly.  Resume Eliquis today. 4. ESRD on hemodialysis-appreciate nephrology 5. Essential hypertension-blood pressures stable and controlled at this time will follow. 6. Chronic diastolic CHF-volume management with hemodialysis.  Not able to hemodialyze much on 1/26. 7. Type 2 diabetes mellitus-stable control on SSI.  Monitor closely. 8. GERD-Protonix ordered for GI protection. 9. CAD-stable.   DVT prophylaxis: Resume Eliquis Code Status: Full Family Communication: None at bedside Disposition Plan: SNF placement pending.  Appreciate ongoing neurology evaluation, but this appears to be related to metabolic derangement/polypharmacy.  EEG performed with results pending.   Consultants:   Nephrology  Neurology  Procedures:   See below  Antimicrobials:  Anti-infectives (From admission, onward)   None          Subjective: Patient seen and evaluated today with no new acute complaints or concerns. No acute concerns or events noted overnight.  Appears more alert and awake and is oriented to person place and time.  Objective: Vitals:   06/12/19 0000 06/12/19 0200 06/12/19 0400 06/12/19 0645  BP: 107/65 (!) 160/90 (!) 101/56 (!) 118/58  Pulse: 80 80 75 79  Resp: 18 18 16 16   Temp: (!) 97.5  F (36.4 C) 98 F (36.7 C) 98 F (36.7 C) 97.7 F (36.5 C)  TempSrc:  Oral Oral Oral  SpO2: 100% 100% 98% 98%  Weight:      Height:        Intake/Output Summary (Last 24 hours) at 06/12/2019 1240 Last  data filed at 06/11/2019 2140 Gross per 24 hour  Intake --  Output 430 ml  Net -430 ml   Filed Weights   06/11/19 0811 06/11/19 1701 06/11/19 1800  Weight: 123.8 kg 98.4 kg 98.4 kg    Examination:  General exam: Appears calm and comfortable  Respiratory system: Clear to auscultation. Respiratory effort normal. Cardiovascular system: S1 & S2 heard, RRR. No JVD, murmurs, rubs, gallops or clicks. No pedal edema. Gastrointestinal system: Abdomen is nondistended, soft and nontender. No organomegaly or masses felt. Normal bowel sounds heard. Central nervous system: Alert and oriented. No focal neurological deficits. Extremities: No edema Skin: No rashes, lesions or ulcers Psychiatry: Judgement and insight appear normal. Mood & affect appropriate.     Data Reviewed: I have personally reviewed following labs and imaging studies  CBC: Recent Labs  Lab 06/11/19 0951 06/12/19 0450  WBC 9.5 6.6  NEUTROABS 6.9 4.5  HGB 10.4* 9.6*  HCT 33.2* 30.5*  MCV 79.2* 79.2*  PLT 222 200   Basic Metabolic Panel: Recent Labs  Lab 06/11/19 0951 06/12/19 0450  NA 133* 133*  K 3.9 3.4*  CL 92* 92*  CO2 30 28  GLUCOSE 120* 93  BUN 38* 17  CREATININE 8.18* 4.68*  CALCIUM 8.8* 8.2*  PHOS  --  2.7   GFR: Estimated Creatinine Clearance: 12.7 mL/min (A) (by C-G formula based on SCr of 4.68 mg/dL (H)). Liver Function Tests: Recent Labs  Lab 06/11/19 0951 06/12/19 0450  AST 13*  --   ALT 7  --   ALKPHOS 49  --   BILITOT 0.7  --   PROT 7.7  --   ALBUMIN 3.1* 3.1*   No results for input(s): LIPASE, AMYLASE in the last 168 hours. Recent Labs  Lab 06/11/19 1504 06/12/19 0727  AMMONIA 42* 26   Coagulation Profile: Recent Labs  Lab 06/11/19 0951  INR 3.1*   Cardiac Enzymes: No results for input(s): CKTOTAL, CKMB, CKMBINDEX, TROPONINI in the last 168 hours. BNP (last 3 results) No results for input(s): PROBNP in the last 8760 hours. HbA1C: No results for input(s): HGBA1C in  the last 72 hours. CBG: Recent Labs  Lab 06/11/19 1717  GLUCAP 125*   Lipid Profile: Recent Labs    06/12/19 0450  CHOL 62  HDL 22*  LDLCALC 27  TRIG 65  CHOLHDL 2.8   Thyroid Function Tests: Recent Labs    06/11/19 1504  TSH 1.117   Anemia Panel: Recent Labs    06/11/19 0951  VITAMINB12 347   Sepsis Labs: No results for input(s): PROCALCITON, LATICACIDVEN in the last 168 hours.  Recent Results (from the past 240 hour(s))  Respiratory Panel by RT PCR (Flu A&B, Covid) - Nasopharyngeal Swab     Status: None   Collection Time: 06/11/19 11:07 AM   Specimen: Nasopharyngeal Swab  Result Value Ref Range Status   SARS Coronavirus 2 by RT PCR NEGATIVE NEGATIVE Final    Comment: (NOTE) SARS-CoV-2 target nucleic acids are NOT DETECTED. The SARS-CoV-2 RNA is generally detectable in upper respiratoy specimens during the acute phase of infection. The lowest concentration of SARS-CoV-2 viral copies this assay can detect is 131 copies/mL. A negative result  does not preclude SARS-Cov-2 infection and should not be used as the sole basis for treatment or other patient management decisions. A negative result may occur with  improper specimen collection/handling, submission of specimen other than nasopharyngeal swab, presence of viral mutation(s) within the areas targeted by this assay, and inadequate number of viral copies (<131 copies/mL). A negative result must be combined with clinical observations, patient history, and epidemiological information. The expected result is Negative. Fact Sheet for Patients:  https://www.moore.com/ Fact Sheet for Healthcare Providers:  https://www.young.biz/ This test is not yet ap proved or cleared by the [place] FDA and  has been authorized for detection and/or diagnosis of SARS-CoV-2 by FDA under an Emergency Use Authorization (EUA). This EUA will remain  in effect (meaning this test can be used)  for the duration of the COVID-19 declaration under Section 564(b)(1) of the Act, 21 U.S.C. section 360bbb-3(b)(1), unless the authorization is terminated or revoked sooner.    Influenza A by PCR NEGATIVE NEGATIVE Final   Influenza B by PCR NEGATIVE NEGATIVE Final    Comment: (NOTE) The Xpert Xpress SARS-CoV-2/FLU/RSV assay is intended as an aid in  the diagnosis of influenza from Nasopharyngeal swab specimens and  should not be used as a sole basis for treatment. Nasal washings and  aspirates are unacceptable for Xpert Xpress SARS-CoV-2/FLU/RSV  testing. Fact Sheet for Patients: https://www.moore.com/ Fact Sheet for Healthcare Providers: https://www.young.biz/ This test is not yet approved or cleared by the [place] FDA and  has been authorized for detection and/or diagnosis of SARS-CoV-2 by  FDA under an Emergency Use Authorization (EUA). This EUA will remain  in effect (meaning this test can be used) for the duration of the  Covid-19 declaration under Section 564(b)(1) of the Act, 21  U.S.C. section 360bbb-3(b)(1), unless the authorization is  terminated or revoked. Performed at [facility], [address], [place], [place] [address]          Radiology Studies: CT HEAD WO CONTRAST  Result Date: 06/11/2019 CLINICAL DATA:  Altered mental status with unclear cause EXAM: CT HEAD WITHOUT CONTRAST TECHNIQUE: Contiguous axial images were obtained from the base of the skull through the vertex without intravenous contrast. COMPARISON:  08/13/2017 FINDINGS: Brain: No evidence of acute infarction, hemorrhage, hydrocephalus, extra-axial collection or mass lesion/mass effect. Cerebral volume loss and mild chronic small vessel ischemic type change. Vascular: No hyperdense vessel or unexpected calcification. Skull: Normal. Negative for fracture or focal lesion. Sinuses/Orbits: Left cataract resection IMPRESSION: Senescent changes without acute or  reversible finding. Electronically Signed   By: Marnee Spring M.D.   On: 06/11/2019 10:47   MR ANGIO HEAD WO CONTRAST  Result Date: 06/11/2019 CLINICAL DATA:  Altered mental status EXAM: MRI HEAD WITHOUT CONTRAST MRA HEAD WITHOUT CONTRAST TECHNIQUE: Multiplanar, multiecho pulse sequences of the brain and surrounding structures were obtained without intravenous contrast. Angiographic images of the head were obtained using MRA technique without contrast. COMPARISON:  Correlation made with prior CTs FINDINGS: MRI HEAD FINDINGS Brain: There is a small subcentimeter focus of reduced diffusion in the right centrum semiovale. No evidence of hemorrhage. Patchy and mildly confluent T2 hyperintensity in the supratentorial white matter is nonspecific but probably reflects mild to moderate chronic microvascular ischemic changes. Small chronic right cerebellar infarcts. Prominence of the ventricles and sulci reflects generalized parenchymal volume loss. There is no intracranial mass, mass effect, hydrocephalus, or extra-axial fluid collection. Vascular: Major vessel flow voids at the skull base are preserved. Skull and upper cervical spine: No aggressive  osseous lesion. Sinuses/Orbits: Trace paranasal sinus mucosal thickening. Left lens replacement. Other: Partial left mastoid fluid opacification. Sella is unremarkable. MRA HEAD FINDINGS Artifact is present. Intracranial internal carotid arteries are patent. Proximal middle and anterior cerebral arteries are patent where adequately visualized with suboptimal evaluation due to artifact. Intracranial vertebral arteries, basilar artery, proximal left posterior cerebral arteries are patent. Right vertebral artery terminates as a PICA. Right posterior cerebral artery is not well seen. IMPRESSION: Small acute to subacute infarction within the right centrum semiovale. Mild to moderate chronic microvascular ischemic changes. Chronic cerebellar infarcts. Motion degraded vascular  imaging. Visualized portions of the proximal circulation are patent. The right posterior cerebral artery is not well seen. Electronically Signed   By: Guadlupe Spanish M.D.   On: 06/11/2019 16:43   MR BRAIN WO CONTRAST  Result Date: 06/11/2019 CLINICAL DATA:  Altered mental status EXAM: MRI HEAD WITHOUT CONTRAST MRA HEAD WITHOUT CONTRAST TECHNIQUE: Multiplanar, multiecho pulse sequences of the brain and surrounding structures were obtained without intravenous contrast. Angiographic images of the head were obtained using MRA technique without contrast. COMPARISON:  Correlation made with prior CTs FINDINGS: MRI HEAD FINDINGS Brain: There is a small subcentimeter focus of reduced diffusion in the right centrum semiovale. No evidence of hemorrhage. Patchy and mildly confluent T2 hyperintensity in the supratentorial white matter is nonspecific but probably reflects mild to moderate chronic microvascular ischemic changes. Small chronic right cerebellar infarcts. Prominence of the ventricles and sulci reflects generalized parenchymal volume loss. There is no intracranial mass, mass effect, hydrocephalus, or extra-axial fluid collection. Vascular: Major vessel flow voids at the skull base are preserved. Skull and upper cervical spine: No aggressive osseous lesion. Sinuses/Orbits: Trace paranasal sinus mucosal thickening. Left lens replacement. Other: Partial left mastoid fluid opacification. Sella is unremarkable. MRA HEAD FINDINGS Artifact is present. Intracranial internal carotid arteries are patent. Proximal middle and anterior cerebral arteries are patent where adequately visualized with suboptimal evaluation due to artifact. Intracranial vertebral arteries, basilar artery, proximal left posterior cerebral arteries are patent. Right vertebral artery terminates as a PICA. Right posterior cerebral artery is not well seen. IMPRESSION: Small acute to subacute infarction within the right centrum semiovale. Mild to moderate  chronic microvascular ischemic changes. Chronic cerebellar infarcts. Motion degraded vascular imaging. Visualized portions of the proximal circulation are patent. The right posterior cerebral artery is not well seen. Electronically Signed   By: Guadlupe Spanish M.D.   On: 06/11/2019 16:43        Scheduled Meds: .  stroke: mapping our early stages of recovery book   Does not apply Once  . amiodarone  200 mg Oral Daily  . aspirin  300 mg Rectal Daily   Or  . aspirin  325 mg Oral Daily  . atorvastatin  80 mg Oral q1800  . calcitRIOL  0.25 mcg Oral Once per day on Mon Wed Fri  . darbepoetin (ARANESP) injection - NON-DIALYSIS  60 mcg Subcutaneous Q Tue-1800  . docusate sodium  100 mg Oral QODAY  . ferric citrate  420 mg Oral TID WC  . midodrine  5 mg Oral TID WC  . sevelamer carbonate  1,600 mg Oral TID WC  . sodium chloride flush  3 mL Intravenous Q12H   Continuous Infusions: . sodium chloride    . sodium chloride    . sodium chloride       LOS: 1 day    Time spent: 30 minutes    Jacori Mulrooney Hoover Brunette, DO [facility] Pager [phone]  If 7PM-7AM, please contact night-coverage [email or web address] Password [facility] 06/12/2019, 12:40 PM

## 2019-06-12 NOTE — Plan of Care (Signed)
  Problem: Acute Rehab PT Goals(only PT should resolve) Goal: Pt Will Go Supine/Side To Sit Outcome: Progressing Flowsheets (Taken 06/12/2019 1433) Pt will go Supine/Side to Sit: with minimal assist Goal: Patient Will Transfer Sit To/From Stand Outcome: Progressing Flowsheets (Taken 06/12/2019 1433) Patient will transfer sit to/from stand: with moderate assist Goal: Pt Will Transfer Bed To Chair/Chair To Bed Outcome: Progressing Flowsheets (Taken 06/12/2019 1433) Pt will Transfer Bed to Chair/Chair to Bed:  with min assist  with mod assist Goal: Pt Will Ambulate Outcome: Progressing Flowsheets (Taken 06/12/2019 1433) Pt will Ambulate:  25 feet  with minimal assist  with moderate assist  with rolling walker   2:34 PM, 06/12/19 Lonell Grandchild, MPT Physical Therapist with [facility] 336 [phone] office [phone] mobile phone

## 2019-06-12 NOTE — Procedures (Signed)
Clarke A. Merlene Laughter, MD     [email or web address]           HISTORY: The patient is 74 year old female who presents with confusion. This study is being done to evaluate for seizures as the etiology.  MEDICATIONS:  Current Facility-Administered Medications:  .  0.9 %  sodium chloride infusion, 250 mL, Intravenous, PRN, Johnson, Clanford L, MD .  0.9 %  sodium chloride infusion, 100 mL, Intravenous, PRN, Donato Heinz, MD .  0.9 %  sodium chloride infusion, 100 mL, Intravenous, PRN, Donato Heinz, MD .  acetaminophen (TYLENOL) tablet 650 mg, 650 mg, Oral, Q6H PRN **OR** acetaminophen (TYLENOL) suppository 650 mg, 650 mg, Rectal, Q6H PRN, Johnson, Clanford L, MD .  albuterol (PROVENTIL) (2.5 MG/3ML) 0.083% nebulizer solution 2.5 mg, 2.5 mg, Inhalation, Q6H PRN, Johnson, Clanford L, MD .  amiodarone (PACERONE) tablet 200 mg, 200 mg, Oral, Daily, Johnson, Clanford L, MD, 200 mg at 06/12/19 0935 .  apixaban (ELIQUIS) tablet 2.5 mg, 2.5 mg, Oral, BID, Manuella Ghazi, Pratik D, DO, 2.5 mg at 06/12/19 1412 .  [START ON 06/13/2019] aspirin EC tablet 81 mg, 81 mg, Oral, Daily, Manuella Ghazi, Pratik D, DO .  atorvastatin (LIPITOR) tablet 80 mg, 80 mg, Oral, q1800, Johnson, Clanford L, MD, 80 mg at 06/12/19 1720 .  calcitRIOL (ROCALTROL) capsule 0.25 mcg, 0.25 mcg, Oral, Once per day on Mon Wed Fri, Johnson, Clanford L, MD, 0.25 mcg at 06/12/19 6387 .  Darbepoetin Alfa (ARANESP) injection 60 mcg, 60 mcg, Subcutaneous, Q Tue-1800, Coladonato, Joseph, MD .  docusate sodium (COLACE) capsule 100 mg, 100 mg, Oral, QODAY, Johnson, Clanford L, MD, 100 mg at 06/12/19 0935 .  ferric citrate (AURYXIA) tablet 420 mg, 420 mg, Oral, TID WC, Johnson, Clanford L, MD, 420 mg at 06/12/19 1719 .  heparin injection 2,500 Units, 20 Units/kg, Dialysis, PRN, Donato Heinz, MD .  lidocaine (PF) (XYLOCAINE) 1 % injection 5 mL, 5 mL, Intradermal, PRN, Donato Heinz, MD .  lidocaine-prilocaine (EMLA) cream  1 application, 1 application, Topical, PRN, Donato Heinz, MD .  midodrine (PROAMATINE) tablet 5 mg, 5 mg, Oral, TID WC, Johnson, Clanford L, MD, 5 mg at 06/12/19 1721 .  nitroGLYCERIN (NITROSTAT) SL tablet 0.4 mg, 0.4 mg, Sublingual, Q5 min PRN, Johnson, Clanford L, MD .  ondansetron (ZOFRAN) tablet 4 mg, 4 mg, Oral, Q6H PRN **OR** ondansetron (ZOFRAN) injection 4 mg, 4 mg, Intravenous, Q6H PRN, Johnson, Clanford L, MD .  pentafluoroprop-tetrafluoroeth (GEBAUERS) aerosol 1 application, 1 application, Topical, PRN, Donato Heinz, MD .  polyethylene glycol (MIRALAX / GLYCOLAX) packet 17 g, 17 g, Oral, Daily PRN, Johnson, Clanford L, MD .  senna-docusate (Senokot-S) tablet 1 tablet, 1 tablet, Oral, QHS PRN, Johnson, Clanford L, MD .  sevelamer carbonate (RENVELA) tablet 1,600 mg, 1,600 mg, Oral, TID WC, Johnson, Clanford L, MD, 1,600 mg at 06/12/19 1720 .  sodium chloride flush (NS) 0.9 % injection 3 mL, 3 mL, Intravenous, Q12H, Johnson, Clanford L, MD, 3 mL at 06/12/19 0938 .  sodium chloride flush (NS) 0.9 % injection 3 mL, 3 mL, Intravenous, PRN, Johnson, Clanford L, MD     ANALYSIS: A 16 channel recording using standard 10 20 measurements is conducted for 23 minutes.  There is a posterior dominant rhythm of 7 hertz which attenuates with eye-opening. There is beta activity observed in the frontal areas. Awake and drowsy architecture are noted. Photic stimulation and hyperventilation are not conducted. There is no focal or lateralized slowing. There is no epileptiform activity is noted.  IMPRESSION: 1. This recording of the awake and drowsy state shows mild global slowing indicating a mild global encephalopathy. However, there is no epileptiform activity is noted.      Shronda Boeh A. Merlene Laughter, M.D.  Diplomate, Tax adviser of Psychiatry and Neurology ( Neurology).

## 2019-06-13 LAB — CBC
HCT: 32.6 % — ABNORMAL LOW (ref 36.0–46.0)
Hemoglobin: 10.3 g/dL — ABNORMAL LOW (ref 12.0–15.0)
MCH: 25.1 pg — ABNORMAL LOW (ref 26.0–34.0)
MCHC: 31.6 g/dL (ref 30.0–36.0)
MCV: 79.5 fL — ABNORMAL LOW (ref 80.0–100.0)
Platelets: 229 10*3/uL (ref 150–400)
RBC: 4.1 MIL/uL (ref 3.87–5.11)
RDW: 15.9 % — ABNORMAL HIGH (ref 11.5–15.5)
WBC: 7.8 10*3/uL (ref 4.0–10.5)
nRBC: 0 % (ref 0.0–0.2)

## 2019-06-13 LAB — BASIC METABOLIC PANEL
Anion gap: 14 (ref 5–15)
BUN: 29 mg/dL — ABNORMAL HIGH (ref 8–23)
CO2: 27 mmol/L (ref 22–32)
Calcium: 8.7 mg/dL — ABNORMAL LOW (ref 8.9–10.3)
Chloride: 90 mmol/L — ABNORMAL LOW (ref 98–111)
Creatinine, Ser: 6.34 mg/dL — ABNORMAL HIGH (ref 0.44–1.00)
GFR calc Af Amer: 7 mL/min — ABNORMAL LOW (ref >60)
GFR calc non Af Amer: 6 mL/min — ABNORMAL LOW (ref >60)
Glucose, Bld: 131 mg/dL — ABNORMAL HIGH (ref 70–99)
Potassium: 4.6 mmol/L (ref 3.5–5.1)
Sodium: 131 mmol/L — ABNORMAL LOW (ref 135–145)

## 2019-06-13 LAB — RENAL FUNCTION PANEL
Albumin: 3.1 g/dL — ABNORMAL LOW (ref 3.5–5.0)
Anion gap: 11 (ref 5–15)
BUN: 28 mg/dL — ABNORMAL HIGH (ref 8–23)
CO2: 31 mmol/L (ref 22–32)
Calcium: 8.7 mg/dL — ABNORMAL LOW (ref 8.9–10.3)
Chloride: 91 mmol/L — ABNORMAL LOW (ref 98–111)
Creatinine, Ser: 6.32 mg/dL — ABNORMAL HIGH (ref 0.44–1.00)
GFR calc Af Amer: 7 mL/min — ABNORMAL LOW (ref >60)
GFR calc non Af Amer: 6 mL/min — ABNORMAL LOW (ref >60)
Glucose, Bld: 137 mg/dL — ABNORMAL HIGH (ref 70–99)
Phosphorus: 3 mg/dL (ref 2.5–4.6)
Potassium: 4 mmol/L (ref 3.5–5.1)
Sodium: 133 mmol/L — ABNORMAL LOW (ref 135–145)

## 2019-06-13 LAB — HOMOCYSTEINE: Homocysteine: 44.9 umol/L — ABNORMAL HIGH (ref 0.0–19.2)

## 2019-06-13 LAB — RPR: RPR Ser Ql: NONREACTIVE

## 2019-06-13 MED ORDER — HEPARIN SODIUM (PORCINE) 1000 UNIT/ML DIALYSIS
20.0000 [IU]/kg | INTRAMUSCULAR | Status: DC | PRN
  Filled 2019-06-13: qty 2

## 2019-06-13 MED ORDER — APIXABAN 2.5 MG PO TABS: 2.5000 mg | ORAL_TABLET | Freq: Two times a day (BID) | ORAL | 0 refills | Status: AC

## 2019-06-13 MED ORDER — ALBUMIN HUMAN 25 % IV SOLN
12.5000 g | Freq: Once | INTRAVENOUS | Status: AC
  Administered 2019-06-13: 21:00:00 12.5 g via INTRAVENOUS
  Filled 2019-06-13: qty 100

## 2019-06-13 NOTE — Progress Notes (Signed)
Patient ID: Alexis [name], female   DOB: Oct 01, 1945, 74 y.o.   MRN: [id number] S: Feels well and admits that she is concerned that her daughter wants her to go to a SNF O:BP 118/62 (BP Location: Left Arm)   Pulse 80   Temp 98.2 F (36.8 C)   Resp 16   Ht 5\' 6"  (1.676 m)   Wt 98.4 kg   SpO2 97%   BMI 35.01 kg/m   Intake/Output Summary (Last 24 hours) at 06/13/2019 1134 Last data filed at 06/13/2019 0900 Gross per 24 hour  Intake 480 ml  Output --  Net 480 ml   Intake/Output: I/O last 3 completed shifts: In: 240 [P.O.:240] Out: 430 [Other:430]  Intake/Output this shift:  Total I/O In: 240 [P.O.:240] Out: -  Weight change:  Gen: NAD CVS: no rub Resp: cta Abd: +BS, soft, NT/ND Ext: no edema, R AVG +T/B  Recent Labs  Lab 06/11/19 0951 06/12/19 0450 06/13/19 0502  NA 133* 133* 133*  131*  K 3.9 3.4* 4.0  4.6  CL 92* 92* 91*  90*  CO2 30 28 31  27   GLUCOSE 120* 93 137*  131*  BUN 38* 17 28*  29*  CREATININE 8.18* 4.68* 6.32*  6.34*  ALBUMIN 3.1* 3.1* 3.1*  CALCIUM 8.8* 8.2* 8.7*  8.7*  PHOS  --  2.7 3.0  AST 13*  --   --   ALT 7  --   --    Liver Function Tests: Recent Labs  Lab 06/11/19 0951 06/12/19 0450 06/13/19 0502  AST 13*  --   --   ALT 7  --   --   ALKPHOS 49  --   --   BILITOT 0.7  --   --   PROT 7.7  --   --   ALBUMIN 3.1* 3.1* 3.1*   No results for input(s): LIPASE, AMYLASE in the last 168 hours. Recent Labs  Lab 06/11/19 1504 06/12/19 0727  AMMONIA 42* 26   CBC: Recent Labs  Lab 06/11/19 0951 06/12/19 0450 06/13/19 0502  WBC 9.5 6.6 7.8  NEUTROABS 6.9 4.5  --   HGB 10.4* 9.6* 10.3*  HCT 33.2* 30.5* 32.6*  MCV 79.2* 79.2* 79.5*  PLT 222 200 229   Cardiac Enzymes: No results for input(s): CKTOTAL, CKMB, CKMBINDEX, TROPONINI in the last 168 hours. CBG: Recent Labs  Lab 06/11/19 1717  GLUCAP 125*    Iron Studies: No results for input(s): IRON, TIBC, TRANSFERRIN, FERRITIN in the last 72 hours. Studies/Results: MR  ANGIO HEAD WO CONTRAST  Result Date: 06/11/2019 CLINICAL DATA:  Altered mental status EXAM: MRI HEAD WITHOUT CONTRAST MRA HEAD WITHOUT CONTRAST TECHNIQUE: Multiplanar, multiecho pulse sequences of the brain and surrounding structures were obtained without intravenous contrast. Angiographic images of the head were obtained using MRA technique without contrast. COMPARISON:  Correlation made with prior CTs FINDINGS: MRI HEAD FINDINGS Brain: There is a small subcentimeter focus of reduced diffusion in the right centrum semiovale. No evidence of hemorrhage. Patchy and mildly confluent T2 hyperintensity in the supratentorial white matter is nonspecific but probably reflects mild to moderate chronic microvascular ischemic changes. Small chronic right cerebellar infarcts. Prominence of the ventricles and sulci reflects generalized parenchymal volume loss. There is no intracranial mass, mass effect, hydrocephalus, or extra-axial fluid collection. Vascular: Major vessel flow voids at the skull base are preserved. Skull and upper cervical spine: No aggressive osseous lesion. Sinuses/Orbits: Trace paranasal sinus mucosal thickening. Left lens replacement. Other: Partial left mastoid  fluid opacification. Sella is unremarkable. MRA HEAD FINDINGS Artifact is present. Intracranial internal carotid arteries are patent. Proximal middle and anterior cerebral arteries are patent where adequately visualized with suboptimal evaluation due to artifact. Intracranial vertebral arteries, basilar artery, proximal left posterior cerebral arteries are patent. Right vertebral artery terminates as a PICA. Right posterior cerebral artery is not well seen. IMPRESSION: Small acute to subacute infarction within the right centrum semiovale. Mild to moderate chronic microvascular ischemic changes. Chronic cerebellar infarcts. Motion degraded vascular imaging. Visualized portions of the proximal circulation are patent. The right posterior cerebral  artery is not well seen. Electronically Signed   By: Macy Mis M.D.   On: 06/11/2019 16:43   MR BRAIN WO CONTRAST  Result Date: 06/11/2019 CLINICAL DATA:  Altered mental status EXAM: MRI HEAD WITHOUT CONTRAST MRA HEAD WITHOUT CONTRAST TECHNIQUE: Multiplanar, multiecho pulse sequences of the brain and surrounding structures were obtained without intravenous contrast. Angiographic images of the head were obtained using MRA technique without contrast. COMPARISON:  Correlation made with prior CTs FINDINGS: MRI HEAD FINDINGS Brain: There is a small subcentimeter focus of reduced diffusion in the right centrum semiovale. No evidence of hemorrhage. Patchy and mildly confluent T2 hyperintensity in the supratentorial white matter is nonspecific but probably reflects mild to moderate chronic microvascular ischemic changes. Small chronic right cerebellar infarcts. Prominence of the ventricles and sulci reflects generalized parenchymal volume loss. There is no intracranial mass, mass effect, hydrocephalus, or extra-axial fluid collection. Vascular: Major vessel flow voids at the skull base are preserved. Skull and upper cervical spine: No aggressive osseous lesion. Sinuses/Orbits: Trace paranasal sinus mucosal thickening. Left lens replacement. Other: Partial left mastoid fluid opacification. Sella is unremarkable. MRA HEAD FINDINGS Artifact is present. Intracranial internal carotid arteries are patent. Proximal middle and anterior cerebral arteries are patent where adequately visualized with suboptimal evaluation due to artifact. Intracranial vertebral arteries, basilar artery, proximal left posterior cerebral arteries are patent. Right vertebral artery terminates as a PICA. Right posterior cerebral artery is not well seen. IMPRESSION: Small acute to subacute infarction within the right centrum semiovale. Mild to moderate chronic microvascular ischemic changes. Chronic cerebellar infarcts. Motion degraded vascular  imaging. Visualized portions of the proximal circulation are patent. The right posterior cerebral artery is not well seen. Electronically Signed   By: Macy Mis M.D.   On: 06/11/2019 16:43   US Carotid Bilateral (at [facility] and [facility] only)  Result Date: 06/12/2019 CLINICAL DATA:  Hypertension, stroke, hyperlipidemia, syncope, diabetes EXAM: BILATERAL CAROTID DUPLEX ULTRASOUND TECHNIQUE: Pearline Cables scale imaging, color Doppler and duplex ultrasound were performed of bilateral carotid and vertebral arteries in the neck. COMPARISON:  None. FINDINGS: Criteria: Quantification of carotid stenosis is based on velocity parameters that correlate the residual internal carotid diameter with NASCET-based stenosis levels, using the diameter of the distal internal carotid lumen as the denominator for stenosis measurement. The following velocity measurements were obtained: RIGHT ICA: 93/21 cm/sec CCA: 44/97 cm/sec SYSTOLIC ICA/CCA RATIO:  1.0 ECA: Occluded LEFT ICA: 99/25 cm/sec CCA: 53/00 cm/sec SYSTOLIC ICA/CCA RATIO:  1.1 ECA: 88 cm/sec RIGHT CAROTID ARTERY: Mild tortuosity. Scattered nonocclusive plaque through the common carotid artery, bulb, and proximal ICA without high-grade stenosis. 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 RIGHT VERTEBRAL ARTERY:  High resistance antegrade flow signal. LEFT CAROTID ARTERY: Moderate tortuosity. Calcified plaque in the mid and distal common carotid artery, bulb, and proximal ICA without high-grade stenosis. Normal waveforms and color Doppler signal. LEFT VERTEBRAL ARTERY:  Normal flow direction and waveform. IMPRESSION: 1.  Bilateral carotid plaque resulting in less than 50% diameter ICA stenosis. 2. 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 3.  Antegrade bilateral vertebral arterial flow. Electronically Signed   By: Lucrezia Europe M.D.   On: 06/12/2019 12:58   . amiodarone  200 mg Oral Daily  . apixaban  2.5 mg Oral BID  . aspirin EC  81 mg Oral Daily  . atorvastatin  80  mg Oral q1800  . calcitRIOL  0.25 mcg Oral Once per day on Mon Wed Fri  . darbepoetin (ARANESP) injection - NON-DIALYSIS  60 mcg Subcutaneous Q Tue-1800  . docusate sodium  100 mg Oral QODAY  . ferric citrate  420 mg Oral TID WC  . midodrine  5 mg Oral TID WC  . sevelamer carbonate  1,600 mg Oral TID WC  . sodium chloride flush  3 mL Intravenous Q12H    BMET    Component Value Date/Time   NA 133 (L) 06/13/2019 0502   NA 131 (L) 06/13/2019 0502   K 4.0 06/13/2019 0502   K 4.6 06/13/2019 0502   CL 91 (L) 06/13/2019 0502   CL 90 (L) 06/13/2019 0502   CO2 31 06/13/2019 0502   CO2 27 06/13/2019 0502   GLUCOSE 137 (H) 06/13/2019 0502   GLUCOSE 131 (H) 06/13/2019 0502   BUN 28 (H) 06/13/2019 0502   BUN 29 (H) 06/13/2019 0502   CREATININE 6.32 (H) 06/13/2019 0502   CREATININE 6.34 (H) 06/13/2019 0502   CALCIUM 8.7 (L) 06/13/2019 0502   CALCIUM 8.7 (L) 06/13/2019 0502   GFRNONAA 6 (L) 06/13/2019 0502   GFRNONAA 6 (L) 06/13/2019 0502   GFRAA 7 (L) 06/13/2019 0502   GFRAA 7 (L) 06/13/2019 0502   CBC    Component Value Date/Time   WBC 7.8 06/13/2019 0502   RBC 4.10 06/13/2019 0502   HGB 10.3 (L) 06/13/2019 0502   HCT 32.6 (L) 06/13/2019 0502   PLT 229 06/13/2019 0502   MCV 79.5 (L) 06/13/2019 0502   MCH 25.1 (L) 06/13/2019 0502   MCHC 31.6 06/13/2019 0502   RDW 15.9 (H) 06/13/2019 0502   LYMPHSABS 0.8 06/12/2019 0450   MONOABS 0.9 06/12/2019 0450   EOSABS 0.4 06/12/2019 0450   BASOSABS 0.0 06/12/2019 0450    Dialysis Orders: Center:[facility]. EDW98kgHD Bath 2K/2.5CaTime 4:15Heparin 2000 units IVP then 1000/hr. AccessRAVGBFR 400DFR 600  Hectoral 4.9mcg IV/HD Epogen 4000Units IV/HD Venofer 50 mg IV q Tuesday Othersensipar 30 mg qtx  Assessment/Plan: 1. AMS and weakness- currently unclear etiology. Covid test negative as well as CT scan but MRI with small acute/subacute stroke of right centrum semiovale along with chronic strokes in the  cerebellum. Possibly drug induced (gabapentin, zanaflex, or elavil) vs infectious etiology. Workup per primary team and Neuro is following.  Much better today.  Continue to follow 2. ESRD- Continue with HD qTTS.  She was hypotensive with HD last night but improved afterwards.  Only able to UF 430 ml.  She is mildly above her EDW.   3. Hypertension/volume- stable 4. Acute/subacute CVA- small lesion in right centrum semiovale.  Neuro following.  5. Anemia- cont with ESA and follow H/H 6. Metabolic bone disease- Cont with renal diet and binders if able to take po 7. Nutrition- renal diet, carb modified when cleared to eat 8. Atrial fibrillation- rate controlled with amiodarone  Donetta Potts, MD [facility] [phone]

## 2019-06-13 NOTE — Procedures (Signed)
    HEMODIALYSIS TREATMENT NOTE:  3.5 hour low-heparin treatment completed via right forearm AVG (15g retrograde flow). Goal met: 1 liter removed.  Albumin 12.5g given IV x1.  All blood was returned and hemostasis was achieved in 15 minutes.  Rockwell Alexandria, RN

## 2019-06-13 NOTE — Clinical Social Work Note (Signed)
Patient's PASRR # is [id number] H 06/12/2019     Ihor Gully, LCSW

## 2019-06-13 NOTE — TOC Progression Note (Signed)
Transition of Care [facility]) - Progression Note    Patient Details  Name: Alexis [name] MRN: [id number] Date of Birth: 1945/08/24  Transition of Care [facility]) CM/SW Contact  Ihor Gully, LCSW Phone Number: 06/13/2019, 4:48 PM  Clinical Narrative:    Additional SNF referrals sent to additional facilities of choices. Bed offer from [facility], daughter [name] accepts. [facility] can take patient on 06/14/19 when quarantine bed opens up. [name] at [facility] confirms that facility transportation can transport patient to her TTS @ 6:00 a.m. chairs.    Expected Discharge Plan: [place] Barriers to Discharge: Continued Medical Work up, Other (comment)(PASSR pending)  Expected Discharge Plan and Services Expected Discharge Plan: [place] arrangements for the past 2 months: Apartment Expected Discharge Date: 06/13/19                                     Social Determinants of Health (SDOH) Interventions    Readmission Risk Interventions Readmission Risk Prevention Plan 06/12/2019  Transportation Screening Complete  PCP or Specialist Appt within 3-5 Days Not Complete  HRI or [place] Complete  Social Work Consult for [place] Planning/Counseling Complete  Palliative Care Screening Not Complete  Medication Review [employer] photographer) Complete  Some recent data might be hidden

## 2019-06-13 NOTE — Discharge Summary (Signed)
Physician Discharge Summary  Alexis [name] JGO:[id number] DOB: [birth year] DOA: 06/11/2019  PCP: System, Provider Not In  Admit date: 06/11/2019  Discharge date: 06/13/2019  Admitted From:Home  Disposition:  SNF  Recommendations for Outpatient Follow-up:  1. Follow up with PCP in 1-2 weeks 2. Please obtain BMP/CBC in one week 3. Continue on current medications as prescribed and avoid further muscle relaxers, gabapentin, and Elavil for now.  Resume at lower doses if needed. 4. Resume next dialysis session on Saturday 1/30.  Home Health: None  Equipment/Devices: None  Discharge Condition: Stable  CODE STATUS: Full  Diet recommendation: Heart Healthy/carb modified  Brief/Interim Summary: Per HPI: Alexis H Jacksonis a 74 y.o.femalewith chronic atrial fibrillation on apixaban, end-stage renal disease on hemodialysis TTS, coronary artery disease, type 2 diabetes mellitus, back pain, history of left breast cancer, osteoarthritis, diabetic neuropathy, chronic headaches, stable angina who was sent to the emergency department from her dialysis center today because she was acting altered and confused which is not her normal mentation. She week and having hallucinations according to family members which is outside of her normal mentation. She had not been taking her medications correctly in the last 2 days and not eating or drinking well. She apparently had recently been treated for UTI.   1/27: Patient admitted with suspicion of acute metabolic encephalopathy in setting of polypharmacy.  EEG currently pending.  Brain MRI with acute/subacute CVA of right centrum ovale noted.  Ongoing neurology evaluation appreciated.  SNF placement pending.  Appears more awake and alert today.  1/28: Patient has overall been doing quite well with no acute findings on EEG noted.  She is to remain on her anticoagulation with Eliquis as prescribed and does not require aspirin or other medications.  She has been  assessed by neurology with no other recommendations at this time and is thought to have metabolic/toxic encephalopathy.  Her ammonia levels are within normal levels and she has stayed off Elavil, gabapentin and tizanidine during the course of this admission without any concerns.  Plan to hold these on discharge and may resume at very low doses and 1 at a time as needed.  She will receive hemodialysis today prior to discharge and will continue her usual Tuesday Thursday Saturday schedule.  Discharge Diagnoses:  Principal Problem:   Acute CVA (cerebrovascular accident) ([place]) Active Problems:   CHF (congestive heart failure) (HCC)   Anemia in chronic kidney disease (CKD)   CAD (coronary artery disease)   Type II diabetes mellitus with manifestations ([place])   Essential hypertension   ESRD on hemodialysis ([place])   Pulmonary hypertension (HCC)   GERD (gastroesophageal reflux disease)   Stable angina (HCC)   Altered mental status   Generalized weakness   Acute delirium   Hallucinations  Principal discharge diagnosis: Acute metabolic encephalopathy secondary to polypharmacy.  Discharge Instructions  Discharge Instructions    Diet - low sodium heart healthy   Complete by: As directed    Increase activity slowly   Complete by: As directed      Allergies as of 06/13/2019      Reactions   Olmesartan Other (See Comments)   Hyperkalemia      Medication List    STOP taking these medications   amitriptyline 25 MG tablet Commonly known as: ELAVIL   ciprofloxacin 500 MG tablet Commonly known as: CIPRO   gabapentin 100 MG capsule Commonly known as: NEURONTIN   tiZANidine 4 MG tablet Commonly known as: ZANAFLEX     TAKE  these medications   acetaminophen 325 MG tablet Commonly known as: TYLENOL Take 650 mg by mouth every 6 (six) hours as needed for moderate pain.   albuterol 108 (90 Base) MCG/ACT inhaler Commonly known as: VENTOLIN HFA Inhale 1-2 puffs into the lungs every 6 (six)  hours as needed for wheezing or shortness of breath.   amiodarone 200 MG tablet Commonly known as: PACERONE Take 1 tablet (200 mg total) by mouth daily.   apixaban 2.5 MG Tabs tablet Commonly known as: ELIQUIS Take 1 tablet (2.5 mg total) by mouth 2 (two) times daily. What changed:   medication strength  how much to take   atorvastatin 80 MG tablet Commonly known as: LIPITOR Take 1 tablet (80 mg total) by mouth daily.   Auryxia 1 GM 210 MG(Fe) tablet Generic drug: ferric citrate Take 420 mg by mouth 3 (three) times daily with meals. Take 2 tablets (420 mg) by mouth 3 times daily with meals   calcitRIOL 0.25 MCG capsule Commonly known as: ROCALTROL Take 0.25 mcg by mouth 3 (three) times a week. On Dialysis days Tues, Thur, Sat.   Calcium-Vitamin D-Vitamin K 500-500-40 MG-UNT-MCG Chew Chew 1 tablet by mouth daily.   diclofenac sodium 1 % Gel Commonly known as: VOLTAREN Apply 1 application topically 2 (two) times daily as needed (pain.).   docusate sodium 100 MG capsule Commonly known as: COLACE Take 100 mg by mouth every other day.   lidocaine-prilocaine cream Commonly known as: EMLA Apply 1 application topically daily as needed (prior to port being accessed).   midodrine 5 MG tablet Commonly known as: PROAMATINE Take 1 tablet (5 mg total) by mouth 3 (three) times daily with meals.   nitroGLYCERIN 0.4 MG SL tablet Commonly known as: NITROSTAT Place 0.4 mg under the tongue every 5 (five) minutes as needed for chest pain.   NovoLOG Mix 70/30 FlexPen (70-30) 100 UNIT/ML FlexPen Generic drug: insulin aspart protamine - aspart Inject 55 Units into the skin daily with breakfast.   ondansetron 4 MG disintegrating tablet Commonly known as: ZOFRAN-ODT Take 4 mg by mouth every 6 (six) hours as needed.   polyethylene glycol 17 g packet Commonly known as: MIRALAX / GLYCOLAX Take 17 g by mouth daily as needed for mild constipation.   sevelamer carbonate 800 MG  tablet Commonly known as: RENVELA Take 1,600 mg by mouth 3 (three) times daily with meals.   triamcinolone cream 0.5 % Commonly known as: KENALOG Apply 1 application topically 2 (two) times daily as needed (rash).      Follow-up Information    Branch, Alphonse Guild, MD Follow up in 2 week(s).   Specialty: Cardiology Contact information: [place] [address] [phone]          Allergies  Allergen Reactions  . Olmesartan Other (See Comments)    Hyperkalemia     Consultations: Neurology Nephrology  Procedures/Studies: CT HEAD WO CONTRAST  Result Date: 06/11/2019 CLINICAL DATA:  Altered mental status with unclear cause EXAM: CT HEAD WITHOUT CONTRAST TECHNIQUE: Contiguous axial images were obtained from the base of the skull through the vertex without intravenous contrast. COMPARISON:  08/13/2017 FINDINGS: Brain: No evidence of acute infarction, hemorrhage, hydrocephalus, extra-axial collection or mass lesion/mass effect. Cerebral volume loss and mild chronic small vessel ischemic type change. Vascular: No hyperdense vessel or unexpected calcification. Skull: Normal. Negative for fracture or focal lesion. Sinuses/Orbits: Left cataract resection IMPRESSION: Senescent changes without acute or reversible finding. Electronically Signed   By: Angelica Chessman  Watts M.D.   On: 06/11/2019 10:47   MR ANGIO HEAD WO CONTRAST  Result Date: 06/11/2019 CLINICAL DATA:  Altered mental status EXAM: MRI HEAD WITHOUT CONTRAST MRA HEAD WITHOUT CONTRAST TECHNIQUE: Multiplanar, multiecho pulse sequences of the brain and surrounding structures were obtained without intravenous contrast. Angiographic images of the head were obtained using MRA technique without contrast. COMPARISON:  Correlation made with prior CTs FINDINGS: MRI HEAD FINDINGS Brain: There is a small subcentimeter focus of reduced diffusion in the right centrum semiovale. No evidence of hemorrhage. Patchy and mildly confluent T2  hyperintensity in the supratentorial white matter is nonspecific but probably reflects mild to moderate chronic microvascular ischemic changes. Small chronic right cerebellar infarcts. Prominence of the ventricles and sulci reflects generalized parenchymal volume loss. There is no intracranial mass, mass effect, hydrocephalus, or extra-axial fluid collection. Vascular: Major vessel flow voids at the skull base are preserved. Skull and upper cervical spine: No aggressive osseous lesion. Sinuses/Orbits: Trace paranasal sinus mucosal thickening. Left lens replacement. Other: Partial left mastoid fluid opacification. Sella is unremarkable. MRA HEAD FINDINGS Artifact is present. Intracranial internal carotid arteries are patent. Proximal middle and anterior cerebral arteries are patent where adequately visualized with suboptimal evaluation due to artifact. Intracranial vertebral arteries, basilar artery, proximal left posterior cerebral arteries are patent. Right vertebral artery terminates as a PICA. Right posterior cerebral artery is not well seen. IMPRESSION: Small acute to subacute infarction within the right centrum semiovale. Mild to moderate chronic microvascular ischemic changes. Chronic cerebellar infarcts. Motion degraded vascular imaging. Visualized portions of the proximal circulation are patent. The right posterior cerebral artery is not well seen. Electronically Signed   By: Macy Mis M.D.   On: 06/11/2019 16:43   MR BRAIN WO CONTRAST  Result Date: 06/11/2019 CLINICAL DATA:  Altered mental status EXAM: MRI HEAD WITHOUT CONTRAST MRA HEAD WITHOUT CONTRAST TECHNIQUE: Multiplanar, multiecho pulse sequences of the brain and surrounding structures were obtained without intravenous contrast. Angiographic images of the head were obtained using MRA technique without contrast. COMPARISON:  Correlation made with prior CTs FINDINGS: MRI HEAD FINDINGS Brain: There is a small subcentimeter focus of reduced  diffusion in the right centrum semiovale. No evidence of hemorrhage. Patchy and mildly confluent T2 hyperintensity in the supratentorial white matter is nonspecific but probably reflects mild to moderate chronic microvascular ischemic changes. Small chronic right cerebellar infarcts. Prominence of the ventricles and sulci reflects generalized parenchymal volume loss. There is no intracranial mass, mass effect, hydrocephalus, or extra-axial fluid collection. Vascular: Major vessel flow voids at the skull base are preserved. Skull and upper cervical spine: No aggressive osseous lesion. Sinuses/Orbits: Trace paranasal sinus mucosal thickening. Left lens replacement. Other: Partial left mastoid fluid opacification. Sella is unremarkable. MRA HEAD FINDINGS Artifact is present. Intracranial internal carotid arteries are patent. Proximal middle and anterior cerebral arteries are patent where adequately visualized with suboptimal evaluation due to artifact. Intracranial vertebral arteries, basilar artery, proximal left posterior cerebral arteries are patent. Right vertebral artery terminates as a PICA. Right posterior cerebral artery is not well seen. IMPRESSION: Small acute to subacute infarction within the right centrum semiovale. Mild to moderate chronic microvascular ischemic changes. Chronic cerebellar infarcts. Motion degraded vascular imaging. Visualized portions of the proximal circulation are patent. The right posterior cerebral artery is not well seen. Electronically Signed   By: Macy Mis M.D.   On: 06/11/2019 16:43   US Carotid Bilateral (at [facility] and [facility] only)  Result Date: 06/12/2019 CLINICAL DATA:  Hypertension, stroke, hyperlipidemia, syncope, diabetes  EXAM: BILATERAL CAROTID DUPLEX ULTRASOUND TECHNIQUE: Pearline Cables scale imaging, color Doppler and duplex ultrasound were performed of bilateral carotid and vertebral arteries in the neck. COMPARISON:  None. FINDINGS: Criteria: Quantification of carotid  stenosis is based on velocity parameters that correlate the residual internal carotid diameter with NASCET-based stenosis levels, using the diameter of the distal internal carotid lumen as the denominator for stenosis measurement. The following velocity measurements were obtained: RIGHT ICA: 93/21 cm/sec CCA: 67/12 cm/sec SYSTOLIC ICA/CCA RATIO:  1.0 ECA: Occluded LEFT ICA: 99/25 cm/sec CCA: 45/80 cm/sec SYSTOLIC ICA/CCA RATIO:  1.1 ECA: 88 cm/sec RIGHT CAROTID ARTERY: Mild tortuosity. Scattered nonocclusive plaque through the common carotid artery, bulb, and proximal ICA without high-grade stenosis. 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 RIGHT VERTEBRAL ARTERY:  High resistance antegrade flow signal. LEFT CAROTID ARTERY: Moderate tortuosity. Calcified plaque in the mid and distal common carotid artery, bulb, and proximal ICA without high-grade stenosis. Normal waveforms and color Doppler signal. LEFT VERTEBRAL ARTERY:  Normal flow direction and waveform. IMPRESSION: 1. Bilateral carotid plaque resulting in less than 50% diameter ICA stenosis. 2. 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 3.  Antegrade bilateral vertebral arterial flow. Electronically Signed   By: Lucrezia Europe M.D.   On: 06/12/2019 12:58     Discharge Exam: Vitals:   06/13/19 0130 06/13/19 0500  BP: 111/71 118/62  Pulse: 82 80  Resp: 20 16  Temp: 98.4 F (36.9 C) 98.2 F (36.8 C)  SpO2: 100% 97%   Vitals:   06/12/19 1730 06/12/19 2131 06/13/19 0130 06/13/19 0500  BP: (!) 119/58 (!) 116/49 111/71 118/62  Pulse: 79 81 82 80  Resp: 16 15 20 16   Temp: 98.1 F (36.7 C) 99.1 F (37.3 C) 98.4 F (36.9 C) 98.2 F (36.8 C)  TempSrc: Oral Oral    SpO2:  100% 100% 97%  Weight:      Height:        General: Pt is alert, awake, not in acute distress Cardiovascular: RRR, S1/S2 +, no rubs, no gallops Respiratory: CTA bilaterally, no wheezing, no rhonchi Abdominal: Soft, NT, ND, bowel sounds + Extremities: no  edema, no cyanosis    The results of significant diagnostics from this hospitalization (including imaging, microbiology, ancillary and laboratory) are listed below for reference.     Microbiology: Recent Results (from the past 240 hour(s))  Respiratory Panel by RT PCR (Flu A&B, Covid) - Nasopharyngeal Swab     Status: None   Collection Time: 06/11/19 11:07 AM   Specimen: Nasopharyngeal Swab  Result Value Ref Range Status   SARS Coronavirus 2 by RT PCR NEGATIVE NEGATIVE Final    Comment: (NOTE) SARS-CoV-2 target nucleic acids are NOT DETECTED. The SARS-CoV-2 RNA is generally detectable in upper respiratoy specimens during the acute phase of infection. The lowest concentration of SARS-CoV-2 viral copies this assay can detect is 131 copies/mL. A negative result does not preclude SARS-Cov-2 infection and should not be used as the sole basis for treatment or other patient management decisions. A negative result may occur with  improper specimen collection/handling, submission of specimen other than nasopharyngeal swab, presence of viral mutation(s) within the areas targeted by this assay, and inadequate number of viral copies (<131 copies/mL). A negative result must be combined with clinical observations, patient history, and epidemiological information. The expected result is Negative. Fact Sheet for Patients:  PinkCheek.be Fact Sheet for Healthcare Providers:  GravelBags.it This test is not yet ap proved or cleared by the [place] FDA  and  has been authorized for detection and/or diagnosis of SARS-CoV-2 by FDA under an Emergency Use Authorization (EUA). This EUA will remain  in effect (meaning this test can be used) for the duration of the COVID-19 declaration under Section 564(b)(1) of the Act, 21 U.S.C. section 360bbb-3(b)(1), unless the authorization is terminated or revoked sooner.    Influenza A by PCR NEGATIVE  NEGATIVE Final   Influenza B by PCR NEGATIVE NEGATIVE Final    Comment: (NOTE) The Xpert Xpress SARS-CoV-2/FLU/RSV assay is intended as an aid in  the diagnosis of influenza from Nasopharyngeal swab specimens and  should not be used as a sole basis for treatment. Nasal washings and  aspirates are unacceptable for Xpert Xpress SARS-CoV-2/FLU/RSV  testing. Fact Sheet for Patients: PinkCheek.be Fact Sheet for Healthcare Providers: GravelBags.it This test is not yet approved or cleared by the [place] FDA and  has been authorized for detection and/or diagnosis of SARS-CoV-2 by  FDA under an Emergency Use Authorization (EUA). This EUA will remain  in effect (meaning this test can be used) for the duration of the  Covid-19 declaration under Section 564(b)(1) of the Act, 21  U.S.C. section 360bbb-3(b)(1), unless the authorization is  terminated or revoked. Performed at [facility], [address] [place], [place] [address]      Labs: BNP (last 3 results) No results for input(s): BNP in the last 8760 hours. Basic Metabolic Panel: Recent Labs  Lab 06/11/19 0951 06/12/19 0450 06/13/19 0502  NA 133* 133* 133*  131*  K 3.9 3.4* 4.0  4.6  CL 92* 92* 91*  90*  CO2 30 28 31  27   GLUCOSE 120* 93 137*  131*  BUN 38* 17 28*  29*  CREATININE 8.18* 4.68* 6.32*  6.34*  CALCIUM 8.8* 8.2* 8.7*  8.7*  PHOS  --  2.7 3.0   Liver Function Tests: Recent Labs  Lab 06/11/19 0951 06/12/19 0450 06/13/19 0502  AST 13*  --   --   ALT 7  --   --   ALKPHOS 49  --   --   BILITOT 0.7  --   --   PROT 7.7  --   --   ALBUMIN 3.1* 3.1* 3.1*   No results for input(s): LIPASE, AMYLASE in the last 168 hours. Recent Labs  Lab 06/11/19 1504 06/12/19 0727  AMMONIA 42* 26   CBC: Recent Labs  Lab 06/11/19 0951 06/12/19 0450 06/13/19 0502  WBC 9.5 6.6 7.8  NEUTROABS 6.9 4.5  --   HGB 10.4* 9.6* 10.3*  HCT 33.2* 30.5* 32.6*   MCV 79.2* 79.2* 79.5*  PLT 222 200 229   Cardiac Enzymes: No results for input(s): CKTOTAL, CKMB, CKMBINDEX, TROPONINI in the last 168 hours. BNP: Invalid input(s): POCBNP CBG: Recent Labs  Lab 06/11/19 1717  GLUCAP 125*   D-Dimer No results for input(s): DDIMER in the last 72 hours. Hgb A1c No results for input(s): HGBA1C in the last 72 hours. Lipid Profile Recent Labs    06/12/19 0450  CHOL 62  HDL 22*  LDLCALC 27  TRIG 65  CHOLHDL 2.8   Thyroid function studies Recent Labs    06/11/19 1504  TSH 1.117   Anemia work up Recent Labs    06/11/19 0951  VITAMINB12 347   Urinalysis    Component Value Date/Time   COLORURINE YELLOW 08/19/2012 1126   APPEARANCEUR CLOUDY (A) 08/19/2012 1126   LABSPEC 1.018 08/19/2012 1126   PHURINE 5.0 08/19/2012 1126  GLUCOSEU NEGATIVE 08/19/2012 1126   HGBUR SMALL (A) 08/19/2012 1126   [place] 08/19/2012 1126   [place] 08/19/2012 1126   PROTEINUR >300 (A) 08/19/2012 1126   UROBILINOGEN 0.2 08/19/2012 1126   NITRITE NEGATIVE 08/19/2012 1126   LEUKOCYTESUR SMALL (A) 08/19/2012 1126   Sepsis Labs Invalid input(s): PROCALCITONIN,  WBC,  LACTICIDVEN Microbiology Recent Results (from the past 240 hour(s))  Respiratory Panel by RT PCR (Flu A&B, Covid) - Nasopharyngeal Swab     Status: None   Collection Time: 06/11/19 11:07 AM   Specimen: Nasopharyngeal Swab  Result Value Ref Range Status   SARS Coronavirus 2 by RT PCR NEGATIVE NEGATIVE Final    Comment: (NOTE) SARS-CoV-2 target nucleic acids are NOT DETECTED. The SARS-CoV-2 RNA is generally detectable in upper respiratoy specimens during the acute phase of infection. The lowest concentration of SARS-CoV-2 viral copies this assay can detect is 131 copies/mL. A negative result does not preclude SARS-Cov-2 infection and should not be used as the sole basis for treatment or other patient management decisions. A negative result may occur with  improper  specimen collection/handling, submission of specimen other than nasopharyngeal swab, presence of viral mutation(s) within the areas targeted by this assay, and inadequate number of viral copies (<131 copies/mL). A negative result must be combined with clinical observations, patient history, and epidemiological information. The expected result is Negative. Fact Sheet for Patients:  PinkCheek.be Fact Sheet for Healthcare Providers:  GravelBags.it This test is not yet ap proved or cleared by the [place] FDA and  has been authorized for detection and/or diagnosis of SARS-CoV-2 by FDA under an Emergency Use Authorization (EUA). This EUA will remain  in effect (meaning this test can be used) for the duration of the COVID-19 declaration under Section 564(b)(1) of the Act, 21 U.S.C. section 360bbb-3(b)(1), unless the authorization is terminated or revoked sooner.    Influenza A by PCR NEGATIVE NEGATIVE Final   Influenza B by PCR NEGATIVE NEGATIVE Final    Comment: (NOTE) The Xpert Xpress SARS-CoV-2/FLU/RSV assay is intended as an aid in  the diagnosis of influenza from Nasopharyngeal swab specimens and  should not be used as a sole basis for treatment. Nasal washings and  aspirates are unacceptable for Xpert Xpress SARS-CoV-2/FLU/RSV  testing. Fact Sheet for Patients: PinkCheek.be Fact Sheet for Healthcare Providers: GravelBags.it This test is not yet approved or cleared by the [place] FDA and  has been authorized for detection and/or diagnosis of SARS-CoV-2 by  FDA under an Emergency Use Authorization (EUA). This EUA will remain  in effect (meaning this test can be used) for the duration of the  Covid-19 declaration under Section 564(b)(1) of the Act, 21  U.S.C. section 360bbb-3(b)(1), unless the authorization is  terminated or revoked. Performed at [facility], [address], [place], [place] [address]      Time coordinating discharge: 35 minutes  SIGNED:   Rodena Goldmann, DO [facility] 06/13/2019, 10:34 AM  If 7PM-7AM, please contact night-coverage [email or web address]

## 2019-06-14 ENCOUNTER — Ambulatory Visit (HOSPITAL_COMMUNITY): Admission: RE | Admit: 2019-06-14 | Payer: Medicare Other | Source: Ambulatory Visit | Admitting: Vascular Surgery

## 2019-06-14 LAB — RENAL FUNCTION PANEL
Albumin: 3.3 g/dL — ABNORMAL LOW (ref 3.5–5.0)
Anion gap: 13 (ref 5–15)
BUN: 15 mg/dL (ref 8–23)
CO2: 29 mmol/L (ref 22–32)
Calcium: 8.7 mg/dL — ABNORMAL LOW (ref 8.9–10.3)
Chloride: 92 mmol/L — ABNORMAL LOW (ref 98–111)
Creatinine, Ser: 4.24 mg/dL — ABNORMAL HIGH (ref 0.44–1.00)
GFR calc Af Amer: 11 mL/min — ABNORMAL LOW (ref >60)
GFR calc non Af Amer: 10 mL/min — ABNORMAL LOW (ref >60)
Glucose, Bld: 142 mg/dL — ABNORMAL HIGH (ref 70–99)
Phosphorus: 2.4 mg/dL — ABNORMAL LOW (ref 2.5–4.6)
Potassium: 3.6 mmol/L (ref 3.5–5.1)
Sodium: 134 mmol/L — ABNORMAL LOW (ref 135–145)

## 2019-06-14 LAB — MAGNESIUM: Magnesium: 2.2 mg/dL (ref 1.7–2.4)

## 2019-06-14 SURGERY — A/V SHUNTOGRAM
Anesthesia: LOCAL | Laterality: Right

## 2019-06-14 NOTE — Progress Notes (Signed)
Patient seen and evaluated this morning with no acute concerns or complaints noted.  Please refer to discharge summary dictated 1/28 for full details regarding this hospitalization.  She is stable for transfer to SNF this morning.  Total care time: 15 minutes.

## 2019-06-14 NOTE — Progress Notes (Signed)
Patient ID: Alexis [name], female   DOB: August 27, 1945, 74 y.o.   MRN: [id number] S: No new complaints this am and knows that she will be going to a SNF for "20 days" O:BP (!) 131/52 (BP Location: Left Arm)   Pulse 90   Temp 98.4 F (36.9 C) (Oral)   Resp 16   Ht 5\' 6"  (1.676 m)   Wt 98.4 kg   SpO2 98%   BMI 35.01 kg/m   Intake/Output Summary (Last 24 hours) at 06/14/2019 1124 Last data filed at 06/14/2019 0900 Gross per 24 hour  Intake 360 ml  Output 1000 ml  Net -640 ml   Intake/Output: I/O last 3 completed shifts: In: 720 [P.O.:720] Out: 1000 [Other:1000]  Intake/Output this shift:  Total I/O In: 120 [P.O.:120] Out: -  Weight change:  Gen: NAD CVS: no rub Resp: cta Abd: +BS, soft, NT/ND Ext: no edema, R AVG +T/B  Recent Labs  Lab 06/11/19 0951 06/12/19 0450 06/13/19 0502 06/14/19 0621  NA 133* 133* 133*  131* 134*  K 3.9 3.4* 4.0  4.6 3.6  CL 92* 92* 91*  90* 92*  CO2 30 28 31  27 29   GLUCOSE 120* 93 137*  131* 142*  BUN 38* 17 28*  29* 15  CREATININE 8.18* 4.68* 6.32*  6.34* 4.24*  ALBUMIN 3.1* 3.1* 3.1* 3.3*  CALCIUM 8.8* 8.2* 8.7*  8.7* 8.7*  PHOS  --  2.7 3.0 2.4*  AST 13*  --   --   --   ALT 7  --   --   --    Liver Function Tests: Recent Labs  Lab 06/11/19 0951 06/11/19 0951 06/12/19 0450 06/13/19 0502 06/14/19 0621  AST 13*  --   --   --   --   ALT 7  --   --   --   --   ALKPHOS 49  --   --   --   --   BILITOT 0.7  --   --   --   --   PROT 7.7  --   --   --   --   ALBUMIN 3.1*   < > 3.1* 3.1* 3.3*   < > = values in this interval not displayed.   No results for input(s): LIPASE, AMYLASE in the last 168 hours. Recent Labs  Lab 06/11/19 1504 06/12/19 0727  AMMONIA 42* 26   CBC: Recent Labs  Lab 06/11/19 0951 06/12/19 0450 06/13/19 0502  WBC 9.5 6.6 7.8  NEUTROABS 6.9 4.5  --   HGB 10.4* 9.6* 10.3*  HCT 33.2* 30.5* 32.6*  MCV 79.2* 79.2* 79.5*  PLT 222 200 229   Cardiac Enzymes: No results for input(s): CKTOTAL, CKMB,  CKMBINDEX, TROPONINI in the last 168 hours. CBG: Recent Labs  Lab 06/11/19 1717  GLUCAP 125*    Iron Studies: No results for input(s): IRON, TIBC, TRANSFERRIN, FERRITIN in the last 72 hours. Studies/Results: US Carotid Bilateral (at [facility] only)  Result Date: 06/12/2019 CLINICAL DATA:  Hypertension, stroke, hyperlipidemia, syncope, diabetes EXAM: BILATERAL CAROTID DUPLEX ULTRASOUND TECHNIQUE: Pearline Cables scale imaging, color Doppler and duplex ultrasound were performed of bilateral carotid and vertebral arteries in the neck. COMPARISON:  None. FINDINGS: Criteria: Quantification of carotid stenosis is based on velocity parameters that correlate the residual internal carotid diameter with NASCET-based stenosis levels, using the diameter of the distal internal carotid lumen as the denominator for stenosis measurement. The following velocity measurements were obtained: RIGHT ICA: 93/21 cm/sec  CCA: 78/67 cm/sec SYSTOLIC ICA/CCA RATIO:  1.0 ECA: Occluded LEFT ICA: 99/25 cm/sec CCA: 67/20 cm/sec SYSTOLIC ICA/CCA RATIO:  1.1 ECA: 88 cm/sec RIGHT CAROTID ARTERY: Mild tortuosity. Scattered nonocclusive plaque through the common carotid artery, bulb, and proximal ICA without high-grade stenosis. 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 RIGHT VERTEBRAL ARTERY:  High resistance antegrade flow signal. LEFT CAROTID ARTERY: Moderate tortuosity. Calcified plaque in the mid and distal common carotid artery, bulb, and proximal ICA without high-grade stenosis. Normal waveforms and color Doppler signal. LEFT VERTEBRAL ARTERY:  Normal flow direction and waveform. IMPRESSION: 1. Bilateral carotid plaque resulting in less than 50% diameter ICA stenosis. 2. 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 3.  Antegrade bilateral vertebral arterial flow. Electronically Signed   By: Lucrezia Europe M.D.   On: 06/12/2019 12:58   . amiodarone  200 mg Oral Daily  . apixaban  2.5 mg Oral BID  . aspirin EC  81 mg Oral Daily   . atorvastatin  80 mg Oral q1800  . calcitRIOL  0.25 mcg Oral Once per day on Mon Wed Fri  . darbepoetin (ARANESP) injection - NON-DIALYSIS  60 mcg Subcutaneous Q Tue-1800  . docusate sodium  100 mg Oral QODAY  . ferric citrate  420 mg Oral TID WC  . midodrine  5 mg Oral TID WC  . sevelamer carbonate  1,600 mg Oral TID WC  . sodium chloride flush  3 mL Intravenous Q12H    BMET    Component Value Date/Time   NA 134 (L) 06/14/2019 0621   K 3.6 06/14/2019 0621   CL 92 (L) 06/14/2019 0621   CO2 29 06/14/2019 0621   GLUCOSE 142 (H) 06/14/2019 0621   BUN 15 06/14/2019 0621   CREATININE 4.24 (H) 06/14/2019 0621   CALCIUM 8.7 (L) 06/14/2019 0621   GFRNONAA 10 (L) 06/14/2019 0621   GFRAA 11 (L) 06/14/2019 0621   CBC    Component Value Date/Time   WBC 7.8 06/13/2019 0502   RBC 4.10 06/13/2019 0502   HGB 10.3 (L) 06/13/2019 0502   HCT 32.6 (L) 06/13/2019 0502   PLT 229 06/13/2019 0502   MCV 79.5 (L) 06/13/2019 0502   MCH 25.1 (L) 06/13/2019 0502   MCHC 31.6 06/13/2019 0502   RDW 15.9 (H) 06/13/2019 0502   LYMPHSABS 0.8 06/12/2019 0450   MONOABS 0.9 06/12/2019 0450   EOSABS 0.4 06/12/2019 0450   BASOSABS 0.0 06/12/2019 0450   Dialysis Orders: Center:[facility]. EDW98kgHD Bath 2K/2.5CaTime 4:15Heparin 2000 units IVP then 1000/hr. AccessRAVGBFR 400DFR 600  Hectoral 4.33mcg IV/HD Epogen 4000Units IV/HD Venofer 50 mg IV q Tuesday Othersensipar 30 mg qtx  Assessment/Plan: 1. AMS and weakness- currently unclear etiology. Covid test negative as well as CT scanbut MRI with small acute/subacute stroke of right centrum semiovale along with chronic strokes in the cerebellum. Possibly drug induced (gabapentin, zanaflex, or elavil) vs infectious etiology. Workup per primary teamand Neuro is following. Much better today.  1. To be transferred to SNF today. 2. ESRD- Continue with HD qTTS. She was able to tolerate 1 liter UF with HD yesterday.  Will f/u  with outpatient HD tomorrow. 3. Hypertension/volume- stable 4. Acute/subacute CVA- small lesion in right centrum semiovale. Neuro following.  5. Anemia- cont with ESA and follow H/H 6. Metabolic bone disease- Cont with renal diet and binders if able to take po 7. Nutrition- renal diet, carb modified when cleared to eat 8. Atrial fibrillation- rate controlled with amiodarone 9. Disposition- to  be transferred to [facility] today.  F/u with outpatient Hd tomorrow.  Donetta Potts, MD [employer] [phone]

## 2019-06-14 NOTE — Progress Notes (Signed)
Patient had a 12 beat run of V-tach.  Asymptomatic, vitals stable.  On-call MD notified via text page.  Will continue to monitor.

## 2019-06-14 NOTE — Progress Notes (Signed)
Alert and oriented.  Continues to have lower ext weakness.  Speech clear and swallowing with no difficulty. IV removed and report called to Harmon Pier, nurse at [facility]

## 2019-06-17 DIAGNOSIS — E1122 Type 2 diabetes mellitus with diabetic chronic kidney disease: Secondary | ICD-10-CM | POA: Diagnosis not present

## 2019-06-17 DIAGNOSIS — I69320 Aphasia following cerebral infarction: Secondary | ICD-10-CM | POA: Diagnosis not present

## 2019-06-17 DIAGNOSIS — E119 Type 2 diabetes mellitus without complications: Secondary | ICD-10-CM | POA: Diagnosis not present

## 2019-06-17 DIAGNOSIS — L89313 Pressure ulcer of right buttock, stage 3: Secondary | ICD-10-CM | POA: Diagnosis not present

## 2019-06-17 DIAGNOSIS — G9341 Metabolic encephalopathy: Secondary | ICD-10-CM | POA: Diagnosis not present

## 2019-06-17 DIAGNOSIS — I2510 Atherosclerotic heart disease of native coronary artery without angina pectoris: Secondary | ICD-10-CM | POA: Diagnosis not present

## 2019-06-17 DIAGNOSIS — I959 Hypotension, unspecified: Secondary | ICD-10-CM | POA: Diagnosis not present

## 2019-06-17 DIAGNOSIS — D631 Anemia in chronic kidney disease: Secondary | ICD-10-CM | POA: Diagnosis not present

## 2019-06-17 DIAGNOSIS — D72829 Elevated white blood cell count, unspecified: Secondary | ICD-10-CM | POA: Diagnosis not present

## 2019-06-17 DIAGNOSIS — M6281 Muscle weakness (generalized): Secondary | ICD-10-CM | POA: Diagnosis not present

## 2019-06-17 DIAGNOSIS — D649 Anemia, unspecified: Secondary | ICD-10-CM | POA: Diagnosis not present

## 2019-06-17 DIAGNOSIS — N2581 Secondary hyperparathyroidism of renal origin: Secondary | ICD-10-CM | POA: Diagnosis not present

## 2019-06-17 DIAGNOSIS — I4891 Unspecified atrial fibrillation: Secondary | ICD-10-CM | POA: Diagnosis not present

## 2019-06-17 DIAGNOSIS — Z992 Dependence on renal dialysis: Secondary | ICD-10-CM | POA: Diagnosis not present

## 2019-06-17 DIAGNOSIS — Z03818 Encounter for observation for suspected exposure to other biological agents ruled out: Secondary | ICD-10-CM | POA: Diagnosis not present

## 2019-06-17 DIAGNOSIS — Z7401 Bed confinement status: Secondary | ICD-10-CM | POA: Diagnosis not present

## 2019-06-17 DIAGNOSIS — I69328 Other speech and language deficits following cerebral infarction: Secondary | ICD-10-CM | POA: Diagnosis not present

## 2019-06-17 DIAGNOSIS — I509 Heart failure, unspecified: Secondary | ICD-10-CM | POA: Diagnosis not present

## 2019-06-17 DIAGNOSIS — Z79899 Other long term (current) drug therapy: Secondary | ICD-10-CM | POA: Diagnosis not present

## 2019-06-17 DIAGNOSIS — I639 Cerebral infarction, unspecified: Secondary | ICD-10-CM | POA: Diagnosis not present

## 2019-06-17 DIAGNOSIS — N186 End stage renal disease: Secondary | ICD-10-CM | POA: Diagnosis not present

## 2019-06-17 DIAGNOSIS — R2681 Unsteadiness on feet: Secondary | ICD-10-CM | POA: Diagnosis not present

## 2019-06-17 DIAGNOSIS — I6789 Other cerebrovascular disease: Secondary | ICD-10-CM | POA: Diagnosis not present

## 2019-06-17 DIAGNOSIS — E118 Type 2 diabetes mellitus with unspecified complications: Secondary | ICD-10-CM | POA: Diagnosis not present

## 2019-06-17 DIAGNOSIS — I10 Essential (primary) hypertension: Secondary | ICD-10-CM | POA: Diagnosis not present

## 2019-06-17 DIAGNOSIS — G9340 Encephalopathy, unspecified: Secondary | ICD-10-CM | POA: Diagnosis not present

## 2019-06-17 DIAGNOSIS — U071 COVID-19: Secondary | ICD-10-CM | POA: Diagnosis not present

## 2019-06-17 DIAGNOSIS — E039 Hypothyroidism, unspecified: Secondary | ICD-10-CM | POA: Diagnosis not present

## 2019-06-17 DIAGNOSIS — I209 Angina pectoris, unspecified: Secondary | ICD-10-CM | POA: Diagnosis not present

## 2019-06-17 LAB — POCT I-STAT, CHEM 8
BUN: 35 mg/dL — ABNORMAL HIGH (ref 8–23)
Calcium, Ion: 1.09 mmol/L — ABNORMAL LOW (ref 1.15–1.40)
Chloride: 93 mmol/L — ABNORMAL LOW (ref 98–111)
Creatinine, Ser: 9.4 mg/dL — ABNORMAL HIGH (ref 0.44–1.00)
Glucose, Bld: 115 mg/dL — ABNORMAL HIGH (ref 70–99)
HCT: 34 % — ABNORMAL LOW (ref 36.0–46.0)
Hemoglobin: 11.6 g/dL — ABNORMAL LOW (ref 12.0–15.0)
Potassium: 3.9 mmol/L (ref 3.5–5.1)
Sodium: 134 mmol/L — ABNORMAL LOW (ref 135–145)
TCO2: 30 mmol/L (ref 22–32)

## 2019-06-18 DIAGNOSIS — Z992 Dependence on renal dialysis: Secondary | ICD-10-CM | POA: Diagnosis not present

## 2019-06-18 DIAGNOSIS — N2581 Secondary hyperparathyroidism of renal origin: Secondary | ICD-10-CM | POA: Diagnosis not present

## 2019-06-18 DIAGNOSIS — N186 End stage renal disease: Secondary | ICD-10-CM | POA: Diagnosis not present

## 2019-06-20 DIAGNOSIS — E1122 Type 2 diabetes mellitus with diabetic chronic kidney disease: Secondary | ICD-10-CM | POA: Diagnosis not present

## 2019-06-20 DIAGNOSIS — N2581 Secondary hyperparathyroidism of renal origin: Secondary | ICD-10-CM | POA: Diagnosis not present

## 2019-06-20 DIAGNOSIS — D72829 Elevated white blood cell count, unspecified: Secondary | ICD-10-CM | POA: Diagnosis not present

## 2019-06-20 DIAGNOSIS — Z992 Dependence on renal dialysis: Secondary | ICD-10-CM | POA: Diagnosis not present

## 2019-06-20 DIAGNOSIS — N186 End stage renal disease: Secondary | ICD-10-CM | POA: Diagnosis not present

## 2019-06-22 DIAGNOSIS — N2581 Secondary hyperparathyroidism of renal origin: Secondary | ICD-10-CM | POA: Diagnosis not present

## 2019-06-22 DIAGNOSIS — Z992 Dependence on renal dialysis: Secondary | ICD-10-CM | POA: Diagnosis not present

## 2019-06-22 DIAGNOSIS — N186 End stage renal disease: Secondary | ICD-10-CM | POA: Diagnosis not present

## 2019-06-24 DIAGNOSIS — I639 Cerebral infarction, unspecified: Secondary | ICD-10-CM | POA: Diagnosis not present

## 2019-06-24 DIAGNOSIS — I4891 Unspecified atrial fibrillation: Secondary | ICD-10-CM | POA: Diagnosis not present

## 2019-06-24 DIAGNOSIS — Z79899 Other long term (current) drug therapy: Secondary | ICD-10-CM | POA: Diagnosis not present

## 2019-06-24 DIAGNOSIS — G9341 Metabolic encephalopathy: Secondary | ICD-10-CM | POA: Diagnosis not present

## 2019-06-25 DIAGNOSIS — N2581 Secondary hyperparathyroidism of renal origin: Secondary | ICD-10-CM | POA: Diagnosis not present

## 2019-06-25 DIAGNOSIS — Z992 Dependence on renal dialysis: Secondary | ICD-10-CM | POA: Diagnosis not present

## 2019-06-25 DIAGNOSIS — N186 End stage renal disease: Secondary | ICD-10-CM | POA: Diagnosis not present

## 2019-06-26 DIAGNOSIS — M6281 Muscle weakness (generalized): Secondary | ICD-10-CM | POA: Diagnosis not present

## 2019-06-26 DIAGNOSIS — E119 Type 2 diabetes mellitus without complications: Secondary | ICD-10-CM | POA: Diagnosis not present

## 2019-06-26 DIAGNOSIS — N186 End stage renal disease: Secondary | ICD-10-CM | POA: Diagnosis not present

## 2019-06-26 DIAGNOSIS — L89313 Pressure ulcer of right buttock, stage 3: Secondary | ICD-10-CM | POA: Diagnosis not present

## 2019-06-27 ENCOUNTER — Encounter: Payer: Self-pay | Admitting: Vascular Surgery

## 2019-06-27 DIAGNOSIS — N186 End stage renal disease: Secondary | ICD-10-CM | POA: Diagnosis not present

## 2019-06-27 DIAGNOSIS — Z992 Dependence on renal dialysis: Secondary | ICD-10-CM | POA: Diagnosis not present

## 2019-06-27 DIAGNOSIS — Z7689 Persons encountering health services in other specified circumstances: Secondary | ICD-10-CM | POA: Diagnosis not present

## 2019-06-27 DIAGNOSIS — N2581 Secondary hyperparathyroidism of renal origin: Secondary | ICD-10-CM | POA: Diagnosis not present

## 2019-06-29 DIAGNOSIS — N186 End stage renal disease: Secondary | ICD-10-CM | POA: Diagnosis not present

## 2019-06-29 DIAGNOSIS — N2581 Secondary hyperparathyroidism of renal origin: Secondary | ICD-10-CM | POA: Diagnosis not present

## 2019-06-29 DIAGNOSIS — Z992 Dependence on renal dialysis: Secondary | ICD-10-CM | POA: Diagnosis not present

## 2019-06-29 DIAGNOSIS — Z7689 Persons encountering health services in other specified circumstances: Secondary | ICD-10-CM | POA: Diagnosis not present

## 2019-07-02 DIAGNOSIS — N186 End stage renal disease: Secondary | ICD-10-CM | POA: Diagnosis not present

## 2019-07-02 DIAGNOSIS — Z992 Dependence on renal dialysis: Secondary | ICD-10-CM | POA: Diagnosis not present

## 2019-07-02 DIAGNOSIS — Z7689 Persons encountering health services in other specified circumstances: Secondary | ICD-10-CM | POA: Diagnosis not present

## 2019-07-02 DIAGNOSIS — N2581 Secondary hyperparathyroidism of renal origin: Secondary | ICD-10-CM | POA: Diagnosis not present

## 2019-07-05 DIAGNOSIS — L98419 Non-pressure chronic ulcer of buttock with unspecified severity: Secondary | ICD-10-CM | POA: Diagnosis not present

## 2019-07-05 DIAGNOSIS — I6381 Other cerebral infarction due to occlusion or stenosis of small artery: Secondary | ICD-10-CM | POA: Diagnosis not present

## 2019-07-05 DIAGNOSIS — Z299 Encounter for prophylactic measures, unspecified: Secondary | ICD-10-CM | POA: Diagnosis not present

## 2019-07-05 DIAGNOSIS — I25118 Atherosclerotic heart disease of native coronary artery with other forms of angina pectoris: Secondary | ICD-10-CM | POA: Diagnosis not present

## 2019-07-05 DIAGNOSIS — C50912 Malignant neoplasm of unspecified site of left female breast: Secondary | ICD-10-CM | POA: Diagnosis not present

## 2019-07-06 DIAGNOSIS — N2581 Secondary hyperparathyroidism of renal origin: Secondary | ICD-10-CM | POA: Diagnosis not present

## 2019-07-06 DIAGNOSIS — Z7689 Persons encountering health services in other specified circumstances: Secondary | ICD-10-CM | POA: Diagnosis not present

## 2019-07-06 DIAGNOSIS — N186 End stage renal disease: Secondary | ICD-10-CM | POA: Diagnosis not present

## 2019-07-06 DIAGNOSIS — Z992 Dependence on renal dialysis: Secondary | ICD-10-CM | POA: Diagnosis not present

## 2019-07-09 ENCOUNTER — Telehealth: Payer: Self-pay

## 2019-07-09 DIAGNOSIS — N2581 Secondary hyperparathyroidism of renal origin: Secondary | ICD-10-CM | POA: Diagnosis not present

## 2019-07-09 DIAGNOSIS — N186 End stage renal disease: Secondary | ICD-10-CM | POA: Diagnosis not present

## 2019-07-09 DIAGNOSIS — Z7689 Persons encountering health services in other specified circumstances: Secondary | ICD-10-CM | POA: Diagnosis not present

## 2019-07-09 DIAGNOSIS — Z992 Dependence on renal dialysis: Secondary | ICD-10-CM | POA: Diagnosis not present

## 2019-07-09 NOTE — Telephone Encounter (Signed)
rec'vd referral from Dr. Anthonette Legato - prolonged bleeding R Lower AVG post treatment - To [facility] to schedule

## 2019-07-10 ENCOUNTER — Other Ambulatory Visit: Payer: Self-pay

## 2019-07-10 DIAGNOSIS — I132 Hypertensive heart and chronic kidney disease with heart failure and with stage 5 chronic kidney disease, or end stage renal disease: Secondary | ICD-10-CM | POA: Diagnosis not present

## 2019-07-10 DIAGNOSIS — Z7901 Long term (current) use of anticoagulants: Secondary | ICD-10-CM | POA: Diagnosis not present

## 2019-07-10 DIAGNOSIS — I25118 Atherosclerotic heart disease of native coronary artery with other forms of angina pectoris: Secondary | ICD-10-CM | POA: Diagnosis not present

## 2019-07-10 DIAGNOSIS — I4891 Unspecified atrial fibrillation: Secondary | ICD-10-CM | POA: Diagnosis not present

## 2019-07-10 DIAGNOSIS — E785 Hyperlipidemia, unspecified: Secondary | ICD-10-CM | POA: Diagnosis not present

## 2019-07-10 DIAGNOSIS — L89620 Pressure ulcer of left heel, unstageable: Secondary | ICD-10-CM | POA: Diagnosis not present

## 2019-07-10 DIAGNOSIS — E1122 Type 2 diabetes mellitus with diabetic chronic kidney disease: Secondary | ICD-10-CM | POA: Diagnosis not present

## 2019-07-10 DIAGNOSIS — I447 Left bundle-branch block, unspecified: Secondary | ICD-10-CM | POA: Diagnosis not present

## 2019-07-10 DIAGNOSIS — N186 End stage renal disease: Secondary | ICD-10-CM | POA: Diagnosis not present

## 2019-07-10 DIAGNOSIS — L89312 Pressure ulcer of right buttock, stage 2: Secondary | ICD-10-CM | POA: Diagnosis not present

## 2019-07-10 DIAGNOSIS — M1990 Unspecified osteoarthritis, unspecified site: Secondary | ICD-10-CM | POA: Diagnosis not present

## 2019-07-10 DIAGNOSIS — Z853 Personal history of malignant neoplasm of breast: Secondary | ICD-10-CM | POA: Diagnosis not present

## 2019-07-10 DIAGNOSIS — Z794 Long term (current) use of insulin: Secondary | ICD-10-CM | POA: Diagnosis not present

## 2019-07-10 DIAGNOSIS — L89892 Pressure ulcer of other site, stage 2: Secondary | ICD-10-CM | POA: Diagnosis not present

## 2019-07-10 DIAGNOSIS — D631 Anemia in chronic kidney disease: Secondary | ICD-10-CM | POA: Diagnosis not present

## 2019-07-10 DIAGNOSIS — G8929 Other chronic pain: Secondary | ICD-10-CM | POA: Diagnosis not present

## 2019-07-10 DIAGNOSIS — Z992 Dependence on renal dialysis: Secondary | ICD-10-CM | POA: Diagnosis not present

## 2019-07-10 DIAGNOSIS — E079 Disorder of thyroid, unspecified: Secondary | ICD-10-CM | POA: Diagnosis not present

## 2019-07-10 DIAGNOSIS — K219 Gastro-esophageal reflux disease without esophagitis: Secondary | ICD-10-CM | POA: Diagnosis not present

## 2019-07-10 DIAGNOSIS — I272 Pulmonary hypertension, unspecified: Secondary | ICD-10-CM | POA: Diagnosis not present

## 2019-07-10 DIAGNOSIS — I509 Heart failure, unspecified: Secondary | ICD-10-CM | POA: Diagnosis not present

## 2019-07-10 DIAGNOSIS — M48062 Spinal stenosis, lumbar region with neurogenic claudication: Secondary | ICD-10-CM | POA: Diagnosis not present

## 2019-07-11 DIAGNOSIS — N186 End stage renal disease: Secondary | ICD-10-CM | POA: Diagnosis not present

## 2019-07-11 DIAGNOSIS — N2581 Secondary hyperparathyroidism of renal origin: Secondary | ICD-10-CM | POA: Diagnosis not present

## 2019-07-11 DIAGNOSIS — Z992 Dependence on renal dialysis: Secondary | ICD-10-CM | POA: Diagnosis not present

## 2019-07-12 DIAGNOSIS — E1122 Type 2 diabetes mellitus with diabetic chronic kidney disease: Secondary | ICD-10-CM | POA: Diagnosis not present

## 2019-07-12 DIAGNOSIS — I509 Heart failure, unspecified: Secondary | ICD-10-CM | POA: Diagnosis not present

## 2019-07-12 DIAGNOSIS — G8929 Other chronic pain: Secondary | ICD-10-CM | POA: Diagnosis not present

## 2019-07-12 DIAGNOSIS — E079 Disorder of thyroid, unspecified: Secondary | ICD-10-CM | POA: Diagnosis not present

## 2019-07-12 DIAGNOSIS — M1990 Unspecified osteoarthritis, unspecified site: Secondary | ICD-10-CM | POA: Diagnosis not present

## 2019-07-12 DIAGNOSIS — L89620 Pressure ulcer of left heel, unstageable: Secondary | ICD-10-CM | POA: Diagnosis not present

## 2019-07-12 DIAGNOSIS — K219 Gastro-esophageal reflux disease without esophagitis: Secondary | ICD-10-CM | POA: Diagnosis not present

## 2019-07-12 DIAGNOSIS — I25118 Atherosclerotic heart disease of native coronary artery with other forms of angina pectoris: Secondary | ICD-10-CM | POA: Diagnosis not present

## 2019-07-12 DIAGNOSIS — I447 Left bundle-branch block, unspecified: Secondary | ICD-10-CM | POA: Diagnosis not present

## 2019-07-12 DIAGNOSIS — I4891 Unspecified atrial fibrillation: Secondary | ICD-10-CM | POA: Diagnosis not present

## 2019-07-12 DIAGNOSIS — Z7901 Long term (current) use of anticoagulants: Secondary | ICD-10-CM | POA: Diagnosis not present

## 2019-07-12 DIAGNOSIS — L89892 Pressure ulcer of other site, stage 2: Secondary | ICD-10-CM | POA: Diagnosis not present

## 2019-07-12 DIAGNOSIS — I132 Hypertensive heart and chronic kidney disease with heart failure and with stage 5 chronic kidney disease, or end stage renal disease: Secondary | ICD-10-CM | POA: Diagnosis not present

## 2019-07-12 DIAGNOSIS — Z992 Dependence on renal dialysis: Secondary | ICD-10-CM | POA: Diagnosis not present

## 2019-07-12 DIAGNOSIS — E785 Hyperlipidemia, unspecified: Secondary | ICD-10-CM | POA: Diagnosis not present

## 2019-07-12 DIAGNOSIS — D631 Anemia in chronic kidney disease: Secondary | ICD-10-CM | POA: Diagnosis not present

## 2019-07-12 DIAGNOSIS — N186 End stage renal disease: Secondary | ICD-10-CM | POA: Diagnosis not present

## 2019-07-12 DIAGNOSIS — M48062 Spinal stenosis, lumbar region with neurogenic claudication: Secondary | ICD-10-CM | POA: Diagnosis not present

## 2019-07-12 DIAGNOSIS — I272 Pulmonary hypertension, unspecified: Secondary | ICD-10-CM | POA: Diagnosis not present

## 2019-07-12 DIAGNOSIS — L89312 Pressure ulcer of right buttock, stage 2: Secondary | ICD-10-CM | POA: Diagnosis not present

## 2019-07-12 DIAGNOSIS — Z853 Personal history of malignant neoplasm of breast: Secondary | ICD-10-CM | POA: Diagnosis not present

## 2019-07-12 DIAGNOSIS — Z794 Long term (current) use of insulin: Secondary | ICD-10-CM | POA: Diagnosis not present

## 2019-07-13 DIAGNOSIS — N186 End stage renal disease: Secondary | ICD-10-CM | POA: Diagnosis not present

## 2019-07-13 DIAGNOSIS — Z7689 Persons encountering health services in other specified circumstances: Secondary | ICD-10-CM | POA: Diagnosis not present

## 2019-07-13 DIAGNOSIS — Z992 Dependence on renal dialysis: Secondary | ICD-10-CM | POA: Diagnosis not present

## 2019-07-13 DIAGNOSIS — N2581 Secondary hyperparathyroidism of renal origin: Secondary | ICD-10-CM | POA: Diagnosis not present

## 2019-07-14 DIAGNOSIS — N186 End stage renal disease: Secondary | ICD-10-CM | POA: Diagnosis not present

## 2019-07-14 DIAGNOSIS — Z992 Dependence on renal dialysis: Secondary | ICD-10-CM | POA: Diagnosis not present

## 2019-07-15 DIAGNOSIS — G8929 Other chronic pain: Secondary | ICD-10-CM | POA: Diagnosis not present

## 2019-07-15 DIAGNOSIS — Z853 Personal history of malignant neoplasm of breast: Secondary | ICD-10-CM | POA: Diagnosis not present

## 2019-07-15 DIAGNOSIS — Z7901 Long term (current) use of anticoagulants: Secondary | ICD-10-CM | POA: Diagnosis not present

## 2019-07-15 DIAGNOSIS — I447 Left bundle-branch block, unspecified: Secondary | ICD-10-CM | POA: Diagnosis not present

## 2019-07-15 DIAGNOSIS — I272 Pulmonary hypertension, unspecified: Secondary | ICD-10-CM | POA: Diagnosis not present

## 2019-07-15 DIAGNOSIS — L89312 Pressure ulcer of right buttock, stage 2: Secondary | ICD-10-CM | POA: Diagnosis not present

## 2019-07-15 DIAGNOSIS — Z794 Long term (current) use of insulin: Secondary | ICD-10-CM | POA: Diagnosis not present

## 2019-07-15 DIAGNOSIS — E079 Disorder of thyroid, unspecified: Secondary | ICD-10-CM | POA: Diagnosis not present

## 2019-07-15 DIAGNOSIS — I4891 Unspecified atrial fibrillation: Secondary | ICD-10-CM | POA: Diagnosis not present

## 2019-07-15 DIAGNOSIS — M1990 Unspecified osteoarthritis, unspecified site: Secondary | ICD-10-CM | POA: Diagnosis not present

## 2019-07-15 DIAGNOSIS — I25118 Atherosclerotic heart disease of native coronary artery with other forms of angina pectoris: Secondary | ICD-10-CM | POA: Diagnosis not present

## 2019-07-15 DIAGNOSIS — L89892 Pressure ulcer of other site, stage 2: Secondary | ICD-10-CM | POA: Diagnosis not present

## 2019-07-15 DIAGNOSIS — I132 Hypertensive heart and chronic kidney disease with heart failure and with stage 5 chronic kidney disease, or end stage renal disease: Secondary | ICD-10-CM | POA: Diagnosis not present

## 2019-07-15 DIAGNOSIS — M48062 Spinal stenosis, lumbar region with neurogenic claudication: Secondary | ICD-10-CM | POA: Diagnosis not present

## 2019-07-15 DIAGNOSIS — E785 Hyperlipidemia, unspecified: Secondary | ICD-10-CM | POA: Diagnosis not present

## 2019-07-15 DIAGNOSIS — L89620 Pressure ulcer of left heel, unstageable: Secondary | ICD-10-CM | POA: Diagnosis not present

## 2019-07-15 DIAGNOSIS — N186 End stage renal disease: Secondary | ICD-10-CM | POA: Diagnosis not present

## 2019-07-15 DIAGNOSIS — E1122 Type 2 diabetes mellitus with diabetic chronic kidney disease: Secondary | ICD-10-CM | POA: Diagnosis not present

## 2019-07-15 DIAGNOSIS — K219 Gastro-esophageal reflux disease without esophagitis: Secondary | ICD-10-CM | POA: Diagnosis not present

## 2019-07-15 DIAGNOSIS — D631 Anemia in chronic kidney disease: Secondary | ICD-10-CM | POA: Diagnosis not present

## 2019-07-15 DIAGNOSIS — Z992 Dependence on renal dialysis: Secondary | ICD-10-CM | POA: Diagnosis not present

## 2019-07-15 DIAGNOSIS — I509 Heart failure, unspecified: Secondary | ICD-10-CM | POA: Diagnosis not present

## 2019-07-16 ENCOUNTER — Ambulatory Visit: Payer: Medicare Other | Attending: Internal Medicine

## 2019-07-16 DIAGNOSIS — N186 End stage renal disease: Secondary | ICD-10-CM | POA: Diagnosis not present

## 2019-07-16 DIAGNOSIS — Z992 Dependence on renal dialysis: Secondary | ICD-10-CM | POA: Diagnosis not present

## 2019-07-16 DIAGNOSIS — N2581 Secondary hyperparathyroidism of renal origin: Secondary | ICD-10-CM | POA: Diagnosis not present

## 2019-07-16 DIAGNOSIS — Z7689 Persons encountering health services in other specified circumstances: Secondary | ICD-10-CM | POA: Diagnosis not present

## 2019-07-16 DIAGNOSIS — Z23 Encounter for immunization: Secondary | ICD-10-CM | POA: Insufficient documentation

## 2019-07-16 NOTE — Progress Notes (Signed)
   Covid-19 Vaccination Clinic  Name:  Alexis [name]    MRN: [id number] DOB: [birth year]  07/16/2019  Alexis [name] was observed post Covid-19 immunization for 30 minutes based on pre-vaccination screening without incident. She was provided with Vaccine Information Sheet and instruction to access the V-Safe system.   Alexis [name] was instructed to call 911 with any severe reactions post vaccine: Marland Kitchen Difficulty breathing  . Swelling of face and throat  . A fast heartbeat  . A bad rash all over body  . Dizziness and weakness   Immunizations Administered    Name Date Dose VIS Date Route   Moderna COVID-19 Vaccine 07/16/2019  3:34 PM 0.5 mL 04/16/2019 Intramuscular   Manufacturer: Moderna   Lot: [id number]   [place]: [phone]

## 2019-07-17 DIAGNOSIS — L89892 Pressure ulcer of other site, stage 2: Secondary | ICD-10-CM | POA: Diagnosis not present

## 2019-07-17 DIAGNOSIS — I132 Hypertensive heart and chronic kidney disease with heart failure and with stage 5 chronic kidney disease, or end stage renal disease: Secondary | ICD-10-CM | POA: Diagnosis not present

## 2019-07-17 DIAGNOSIS — I25118 Atherosclerotic heart disease of native coronary artery with other forms of angina pectoris: Secondary | ICD-10-CM | POA: Diagnosis not present

## 2019-07-17 DIAGNOSIS — Z992 Dependence on renal dialysis: Secondary | ICD-10-CM | POA: Diagnosis not present

## 2019-07-17 DIAGNOSIS — L89620 Pressure ulcer of left heel, unstageable: Secondary | ICD-10-CM | POA: Diagnosis not present

## 2019-07-17 DIAGNOSIS — D631 Anemia in chronic kidney disease: Secondary | ICD-10-CM | POA: Diagnosis not present

## 2019-07-17 DIAGNOSIS — G8929 Other chronic pain: Secondary | ICD-10-CM | POA: Diagnosis not present

## 2019-07-17 DIAGNOSIS — E1122 Type 2 diabetes mellitus with diabetic chronic kidney disease: Secondary | ICD-10-CM | POA: Diagnosis not present

## 2019-07-17 DIAGNOSIS — I4891 Unspecified atrial fibrillation: Secondary | ICD-10-CM | POA: Diagnosis not present

## 2019-07-17 DIAGNOSIS — Z794 Long term (current) use of insulin: Secondary | ICD-10-CM | POA: Diagnosis not present

## 2019-07-17 DIAGNOSIS — I272 Pulmonary hypertension, unspecified: Secondary | ICD-10-CM | POA: Diagnosis not present

## 2019-07-17 DIAGNOSIS — Z853 Personal history of malignant neoplasm of breast: Secondary | ICD-10-CM | POA: Diagnosis not present

## 2019-07-17 DIAGNOSIS — L89312 Pressure ulcer of right buttock, stage 2: Secondary | ICD-10-CM | POA: Diagnosis not present

## 2019-07-17 DIAGNOSIS — E079 Disorder of thyroid, unspecified: Secondary | ICD-10-CM | POA: Diagnosis not present

## 2019-07-17 DIAGNOSIS — I447 Left bundle-branch block, unspecified: Secondary | ICD-10-CM | POA: Diagnosis not present

## 2019-07-17 DIAGNOSIS — K219 Gastro-esophageal reflux disease without esophagitis: Secondary | ICD-10-CM | POA: Diagnosis not present

## 2019-07-17 DIAGNOSIS — M1990 Unspecified osteoarthritis, unspecified site: Secondary | ICD-10-CM | POA: Diagnosis not present

## 2019-07-17 DIAGNOSIS — E785 Hyperlipidemia, unspecified: Secondary | ICD-10-CM | POA: Diagnosis not present

## 2019-07-17 DIAGNOSIS — M48062 Spinal stenosis, lumbar region with neurogenic claudication: Secondary | ICD-10-CM | POA: Diagnosis not present

## 2019-07-17 DIAGNOSIS — I509 Heart failure, unspecified: Secondary | ICD-10-CM | POA: Diagnosis not present

## 2019-07-17 DIAGNOSIS — Z7901 Long term (current) use of anticoagulants: Secondary | ICD-10-CM | POA: Diagnosis not present

## 2019-07-17 DIAGNOSIS — N186 End stage renal disease: Secondary | ICD-10-CM | POA: Diagnosis not present

## 2019-07-18 DIAGNOSIS — Z7689 Persons encountering health services in other specified circumstances: Secondary | ICD-10-CM | POA: Diagnosis not present

## 2019-07-18 DIAGNOSIS — N2581 Secondary hyperparathyroidism of renal origin: Secondary | ICD-10-CM | POA: Diagnosis not present

## 2019-07-18 DIAGNOSIS — Z992 Dependence on renal dialysis: Secondary | ICD-10-CM | POA: Diagnosis not present

## 2019-07-18 DIAGNOSIS — N186 End stage renal disease: Secondary | ICD-10-CM | POA: Diagnosis not present

## 2019-07-19 ENCOUNTER — Other Ambulatory Visit (HOSPITAL_COMMUNITY)
Admission: RE | Admit: 2019-07-19 | Discharge: 2019-07-19 | Disposition: A | Discharge status: Home / Self Care | Payer: Medicare Other | Source: Ambulatory Visit | Attending: Vascular Surgery | Admitting: Vascular Surgery

## 2019-07-19 DIAGNOSIS — Z20822 Contact with and (suspected) exposure to covid-19: Secondary | ICD-10-CM | POA: Diagnosis not present

## 2019-07-19 DIAGNOSIS — Z01812 Encounter for preprocedural laboratory examination: Secondary | ICD-10-CM | POA: Diagnosis not present

## 2019-07-20 DIAGNOSIS — Z7689 Persons encountering health services in other specified circumstances: Secondary | ICD-10-CM | POA: Diagnosis not present

## 2019-07-20 DIAGNOSIS — Z992 Dependence on renal dialysis: Secondary | ICD-10-CM | POA: Diagnosis not present

## 2019-07-20 DIAGNOSIS — N186 End stage renal disease: Secondary | ICD-10-CM | POA: Diagnosis not present

## 2019-07-20 DIAGNOSIS — N2581 Secondary hyperparathyroidism of renal origin: Secondary | ICD-10-CM | POA: Diagnosis not present

## 2019-07-20 LAB — SARS CORONAVIRUS 2 (TAT 6-24 HRS): SARS Coronavirus 2: NEGATIVE

## 2019-07-22 ENCOUNTER — Encounter (HOSPITAL_COMMUNITY)
Admission: RE | Disposition: A | Discharge status: Home / Self Care | Payer: Self-pay | Source: Home / Self Care | Attending: Vascular Surgery

## 2019-07-22 ENCOUNTER — Ambulatory Visit: Admit: 2019-07-22 | Payer: Medicare Other | Admitting: Vascular Surgery

## 2019-07-22 ENCOUNTER — Other Ambulatory Visit: Payer: Self-pay

## 2019-07-22 ENCOUNTER — Ambulatory Visit (HOSPITAL_COMMUNITY)
Admission: RE | Admit: 2019-07-22 | Discharge: 2019-07-22 | Disposition: A | Discharge status: Home / Self Care | Payer: Medicare Other | Attending: Vascular Surgery | Admitting: Vascular Surgery

## 2019-07-22 DIAGNOSIS — I2510 Atherosclerotic heart disease of native coronary artery without angina pectoris: Secondary | ICD-10-CM | POA: Diagnosis not present

## 2019-07-22 DIAGNOSIS — M1990 Unspecified osteoarthritis, unspecified site: Secondary | ICD-10-CM | POA: Diagnosis not present

## 2019-07-22 DIAGNOSIS — E785 Hyperlipidemia, unspecified: Secondary | ICD-10-CM | POA: Diagnosis not present

## 2019-07-22 DIAGNOSIS — T82838A Hemorrhage of vascular prosthetic devices, implants and grafts, initial encounter: Secondary | ICD-10-CM | POA: Insufficient documentation

## 2019-07-22 DIAGNOSIS — N186 End stage renal disease: Secondary | ICD-10-CM | POA: Insufficient documentation

## 2019-07-22 DIAGNOSIS — Z95828 Presence of other vascular implants and grafts: Secondary | ICD-10-CM | POA: Diagnosis not present

## 2019-07-22 DIAGNOSIS — T82898A Other specified complication of vascular prosthetic devices, implants and grafts, initial encounter: Secondary | ICD-10-CM | POA: Diagnosis not present

## 2019-07-22 DIAGNOSIS — I132 Hypertensive heart and chronic kidney disease with heart failure and with stage 5 chronic kidney disease, or end stage renal disease: Secondary | ICD-10-CM | POA: Insufficient documentation

## 2019-07-22 DIAGNOSIS — Z79899 Other long term (current) drug therapy: Secondary | ICD-10-CM | POA: Diagnosis not present

## 2019-07-22 DIAGNOSIS — Z992 Dependence on renal dialysis: Secondary | ICD-10-CM | POA: Insufficient documentation

## 2019-07-22 DIAGNOSIS — I509 Heart failure, unspecified: Secondary | ICD-10-CM | POA: Diagnosis not present

## 2019-07-22 DIAGNOSIS — Y832 Surgical operation with anastomosis, bypass or graft as the cause of abnormal reaction of the patient, or of later complication, without mention of misadventure at the time of the procedure: Secondary | ICD-10-CM | POA: Diagnosis not present

## 2019-07-22 DIAGNOSIS — E1122 Type 2 diabetes mellitus with diabetic chronic kidney disease: Secondary | ICD-10-CM | POA: Insufficient documentation

## 2019-07-22 DIAGNOSIS — Z8249 Family history of ischemic heart disease and other diseases of the circulatory system: Secondary | ICD-10-CM | POA: Insufficient documentation

## 2019-07-22 DIAGNOSIS — E079 Disorder of thyroid, unspecified: Secondary | ICD-10-CM | POA: Diagnosis not present

## 2019-07-22 DIAGNOSIS — Z888 Allergy status to other drugs, medicaments and biological substances status: Secondary | ICD-10-CM | POA: Insufficient documentation

## 2019-07-22 DIAGNOSIS — I272 Pulmonary hypertension, unspecified: Secondary | ICD-10-CM | POA: Diagnosis not present

## 2019-07-22 DIAGNOSIS — Z87891 Personal history of nicotine dependence: Secondary | ICD-10-CM | POA: Diagnosis not present

## 2019-07-22 DIAGNOSIS — Z7901 Long term (current) use of anticoagulants: Secondary | ICD-10-CM | POA: Insufficient documentation

## 2019-07-22 HISTORY — PX: A/V FISTULAGRAM: CATH118298

## 2019-07-22 LAB — POCT I-STAT, CHEM 8
BUN: 53 mg/dL — ABNORMAL HIGH (ref 8–23)
Calcium, Ion: 0.94 mmol/L — ABNORMAL LOW (ref 1.15–1.40)
Chloride: 98 mmol/L (ref 98–111)
Creatinine, Ser: 7.9 mg/dL — ABNORMAL HIGH (ref 0.44–1.00)
Glucose, Bld: 128 mg/dL — ABNORMAL HIGH (ref 70–99)
HCT: 32 % — ABNORMAL LOW (ref 36.0–46.0)
Hemoglobin: 10.9 g/dL — ABNORMAL LOW (ref 12.0–15.0)
Potassium: 5.8 mmol/L — ABNORMAL HIGH (ref 3.5–5.1)
Sodium: 133 mmol/L — ABNORMAL LOW (ref 135–145)
TCO2: 31 mmol/L (ref 22–32)

## 2019-07-22 LAB — GLUCOSE, CAPILLARY: Glucose-Capillary: 113 mg/dL — ABNORMAL HIGH (ref 70–99)

## 2019-07-22 LAB — POTASSIUM: Potassium: 3.3 mmol/L — ABNORMAL LOW (ref 3.5–5.1)

## 2019-07-22 SURGERY — ARTERIOVENOUS (AV) FISTULA CREATION
Anesthesia: Monitor Anesthesia Care | Laterality: Left

## 2019-07-22 SURGERY — A/V FISTULAGRAM
Anesthesia: LOCAL | Laterality: Right

## 2019-07-22 MED ORDER — HEPARIN (PORCINE) IN NACL 1000-0.9 UT/500ML-% IV SOLN
INTRAVENOUS | Status: AC
  Filled 2019-07-22: qty 500

## 2019-07-22 MED ORDER — HEPARIN (PORCINE) IN NACL 1000-0.9 UT/500ML-% IV SOLN
INTRAVENOUS | Status: DC | PRN
  Administered 2019-07-22: 500 mL

## 2019-07-22 MED ORDER — SODIUM CHLORIDE 0.9% FLUSH: 3.0000 mL | INTRAVENOUS | Status: DC | PRN

## 2019-07-22 MED ORDER — IODIXANOL 320 MG/ML IV SOLN
INTRAVENOUS | Status: DC | PRN
  Administered 2019-07-22: 30 mL

## 2019-07-22 MED ORDER — SODIUM CHLORIDE 0.9% FLUSH: 3.0000 mL | Freq: Two times a day (BID) | INTRAVENOUS | Status: DC

## 2019-07-22 MED ORDER — SODIUM CHLORIDE 0.9 % IV SOLN: 250.0000 mL | INTRAVENOUS | Status: DC | PRN

## 2019-07-22 MED ORDER — LIDOCAINE HCL (PF) 1 % IJ SOLN
INTRAMUSCULAR | Status: AC
  Filled 2019-07-22: qty 30

## 2019-07-22 MED ORDER — LIDOCAINE HCL (PF) 1 % IJ SOLN
INTRAMUSCULAR | Status: DC | PRN
  Administered 2019-07-22: 2 mL

## 2019-07-22 SURGICAL SUPPLY — 9 items
BAG SNAP BAND KOVER 36X36 (MISCELLANEOUS) ×2 IMPLANT
COVER DOME SNAP 22 D (MISCELLANEOUS) ×2 IMPLANT
KIT MICROPUNCTURE NIT STIFF (SHEATH) ×2 IMPLANT
PROTECTION STATION PRESSURIZED (MISCELLANEOUS) ×2
SHEATH PROBE COVER 6X72 (BAG) ×2 IMPLANT
STATION PROTECTION PRESSURIZED (MISCELLANEOUS) ×1 IMPLANT
STOPCOCK MORSE 400PSI 3WAY (MISCELLANEOUS) ×2 IMPLANT
TRAY PV CATH (CUSTOM PROCEDURE TRAY) ×2 IMPLANT
TUBING CIL FLEX 10 FLL-RA (TUBING) ×2 IMPLANT

## 2019-07-22 NOTE — Op Note (Signed)
Patient name: Alexis [name] MRN: [id number] DOB: [birth year] Sex: female  07/22/2019 Pre-operative Diagnosis: End-stage renal disease, bleeding from right arm AV graft Post-operative diagnosis:  Same Surgeon:  Luanna Salk. Randie Heinz, MD Procedure Performed: 1.  Ultrasound-guided cannulation right arm AV graft 2.  Right upper extremity shuntogram  Indications: 74 year old female with right forearm AV loop graft.  She is on Eliquis.  She does have extended bleeding after dialysis.  She is indicated for shuntogram.  Findings: There is brisk runoff without any peaking stenosis.  Venous anastomosis with approximately 20% stenosis which is nonflow limiting.  There are many valves in her upper extremity veins.  There is no area for intervention.   Procedure:  The patient was identified in the holding area and taken to room 8.  The patient was then placed supine on the table and prepped and draped in the usual sterile fashion.  A time out was called.  Ultrasound was used to evaluate the right arm AV graft.  This area was anesthetized 1% lidocaine.  We cannulated with direct ultrasound visualization and images saved the permanent record.  We placed the micropuncture sheath performed shuntogram of the right upper extremity.  Blood pressure was cuff was placed and we performed retrograde imaging.  Satisfied we removed the sheath and suture-ligated the cannulation site.  She tolerated procedure without any complication.  Contrast: 30cc   Adriene Knipfer C. Randie Heinz, MD [facility] [place] Office: [phone] Pager: [phone]

## 2019-07-22 NOTE — Discharge Instructions (Signed)

## 2019-07-22 NOTE — H&P (Signed)
H+P   History of Present Illness: This is a 74 y.o. female with history of esrd.  She is on dialysis via right arm AV graft.  This has significant bleeding after dialysis.  She does take blood thinners.  She is not having any right upper extremity swelling and no hand pain.  Past Medical History:  Diagnosis Date  . Arthritis   . Cancer [facility]) 2005    left breast  . CHF (congestive heart failure) ([place])   . Chronic back pain   . Chronic kidney disease 03/2014   dialysis t/th/sa  . Coronary artery disease   . Diabetes mellitus    Type 2  . Diabetic nephropathy ([place]) 01-08-13  . Dyslipidemia 01-08-13  . ESRD on hemodialysis ([place])    Tu, Th, Sat  . Headache   . Hyperlipidemia   . Hypertension   . LBBB (left bundle branch block)   . Proteinuria 01-08-13  . Pulmonary hypertension ([place])    PA peak pressure 73 mmHg 08/05/17 echo  . Thyroid disease 01-08-13   Hyper-parathyroidism-secondary  . Wears glasses     Past Surgical History:  Procedure Laterality Date  . A/V FISTULAGRAM N/A 08/25/2017   Procedure: A/V FISTULAGRAM - Left Arm;  Surgeon: Angelia Mould, MD;  Location: [place] CV LAB;  Service: Cardiovascular;  Laterality: N/A;  . A/V FISTULAGRAM N/A 02/09/2018   Procedure: A/V AGTXMIWOEHO;  Surgeon: Elam Dutch, MD;  Location: [place] CV LAB;  Service: Cardiovascular;  Laterality: N/A;  . A/V FISTULAGRAM Left 04/04/2018   Procedure: A/V FISTULAGRAM;  Surgeon: Marty Heck, MD;  Location: [place] CV LAB;  Service: Cardiovascular;  Laterality: Left;  . ABDOMINAL AORTAGRAM N/A 08/11/2014   Procedure: ABDOMINAL Maxcine Ham;  Surgeon: Angelia Mould, MD;  Location: [facility] CATH LAB;  Service: Cardiovascular;  Laterality: N/A;  . ABDOMINAL HYSTERECTOMY    . APPENDECTOMY    . Arm surgery     Left arm trauma  . AV FISTULA PLACEMENT Left 08/21/2014   Procedure: LEFT ARM ARTERIOVENOUS (AV) FISTULA CREATION ;  Surgeon: Angelia Mould, MD;  Location: [facility]  OR;  Service: Vascular;  Laterality: Left;  . AV FISTULA PLACEMENT Right 06/13/2018   Procedure: insertion of right arm ARTERIOVENOUS (AV) gore-tex GRAFT;  Surgeon: Rosetta Posner, MD;  Location: [place];  Service: Vascular;  Laterality: Right;  . BACK SURGERY    . CATARACT EXTRACTION W/PHACO Left 12/22/2014   Procedure: CATARACT EXTRACTION PHACO AND INTRAOCULAR LENS PLACEMENT (IOC);  Surgeon: Tonny Branch, MD;  Location: [facility]S;  Service: Ophthalmology;  Laterality: Left;  CDE 13.48  . CORONARY ANGIOPLASTY  12/19/2003   inferior wall hypokinesis. ef 50%  . dialysis catheter    . INSERTION OF DIALYSIS CATHETER Right 06/13/2018   Procedure: INSERTION OF DIALYSIS CATHETER, right internal jugular;  Surgeon: Rosetta Posner, MD;  Location: [place];  Service: Vascular;  Laterality: Right;  . IR FLUORO GUIDE CV LINE RIGHT  04/06/2018  . IR REMOVAL TUN CV CATH W/O FL  04/25/2018  . IR REMOVAL TUN CV CATH W/O FL  07/20/2018  . IR US GUIDE VASC ACCESS RIGHT  04/06/2018  . LIGATION OF ARTERIOVENOUS  FISTULA Left 03/18/2019   Procedure: LIGATION OF ARTERIOVENOUS  FISTULA  LEFT ARM;  Surgeon: Angelia Mould, MD;  Location: [place];  Service: Vascular;  Laterality: Left;  Marland Kitchen MASTECTOMY     Left  . PERIPHERAL VASCULAR BALLOON ANGIOPLASTY Left 08/25/2017   Procedure: PERIPHERAL VASCULAR  BALLOON ANGIOPLASTY;  Surgeon: Angelia Mould, MD;  Location: [place] CV LAB;  Service: Cardiovascular;  Laterality: Left;  arm fistula  . PERIPHERAL VASCULAR BALLOON ANGIOPLASTY Left 02/09/2018   Procedure: PERIPHERAL VASCULAR BALLOON ANGIOPLASTY;  Surgeon: Elam Dutch, MD;  Location: [place] CV LAB;  Service: Cardiovascular;  Laterality: Left;  AV Fistula  . PERIPHERAL VASCULAR BALLOON ANGIOPLASTY Left 04/04/2018   Procedure: PERIPHERAL VASCULAR BALLOON ANGIOPLASTY;  Surgeon: Marty Heck, MD;  Location: [place] CV LAB;  Service: Cardiovascular;  Laterality: Left;  UPPER ARM FISTULA  . PERIPHERAL  VASCULAR CATHETERIZATION N/A 09/16/2015   Procedure: Fistulagram;  Surgeon: Rosetta Posner, MD;  Location: [place] CV LAB;  Service: Cardiovascular;  Laterality: N/A;  . SPINE SURGERY    . TONSILLECTOMY    . TRANSTHORACIC ECHOCARDIOGRAM  10/01/2010    Left ventricle: The cavity size was mildly dilated. Wall thickness was increased in a pattern of mild LVH. Systolic function was   mildly reduced. The estimated ejection fraction was in the range  of 45% to 50%.   . TUBAL LIGATION      Allergies  Allergen Reactions  . Olmesartan Other (See Comments)    Hyperkalemia     Prior to Admission medications   Medication Sig Start Date End Date Taking? Authorizing Provider  albuterol (VENTOLIN HFA) 108 (90 Base) MCG/ACT inhaler Inhale 1-2 puffs into the lungs every 6 (six) hours as needed for wheezing or shortness of breath.   Yes [provider]  amiodarone (PACERONE) 200 MG tablet Take 1 tablet (200 mg total) by mouth daily. Patient taking differently: Take 200 mg by mouth daily. (0900) 05/30/19  Yes Strader, [place] M, PA-C  apixaban (ELIQUIS) 2.5 MG TABS tablet Take 1 tablet (2.5 mg total) by mouth 2 (two) times daily. Patient taking differently: Take 2.5 mg by mouth 2 (two) times daily. 0900 & 2100 06/13/19 07/17/20 Yes Shah, Pratik D, DO  atorvastatin (LIPITOR) 80 MG tablet Take 1 tablet (80 mg total) by mouth daily. Patient taking differently: Take 80 mg by mouth daily. (1700) 05/30/19  Yes Strader, [place] M, PA-C  calcitRIOL (ROCALTROL) 0.25 MCG capsule Take 0.25 mcg by mouth Every Tuesday,Thursday,and Saturday with dialysis. (1700) 03/26/14  Yes [provider]  diclofenac Sodium (VOLTAREN) 1 % GEL Apply 2 g topically 4 (four) times daily as needed for pain. 06/26/19  Yes [provider]  docusate sodium (COLACE) 100 MG capsule Take 100 mg by mouth every other day. Hold for loose stools   Yes [provider]  lidocaine-prilocaine (EMLA) cream Apply 1  application topically Every Tuesday,Thursday,and Saturday with dialysis. 1 hour prior to dialysis   Yes [provider]  midodrine (PROAMATINE) 5 MG tablet Take 1 tablet (5 mg total) by mouth 3 (three) times daily with meals. 05/30/19  Yes Strader, [place] M, PA-C  nitroGLYCERIN (NITROSTAT) 0.4 MG SL tablet Place 0.4 mg under the tongue every 5 (five) minutes x 3 doses as needed for chest pain.    Yes [provider]  NOVOLOG MIX 70/30 FLEXPEN (70-30) 100 UNIT/ML FlexPen Inject 25-30 Units into the skin See admin instructions. 30 units in the morning & 25 units at nights 06/05/19  Yes [provider]  ondansetron (ZOFRAN-ODT) 4 MG disintegrating tablet Take 4 mg by mouth every 6 (six) hours as needed for nausea or vomiting.  05/18/19  Yes [provider]  polyethylene glycol (MIRALAX / GLYCOLAX) packet Take 17 g by mouth daily as needed  for mild constipation.    Yes [provider]  sevelamer carbonate (RENVELA) 800 MG tablet Take 1,600 mg by mouth 3 (three) times daily with meals.   Yes [provider]    Social History   Socioeconomic History  . Marital status: Legally Separated    Spouse name: Not on file  . Number of children: 3  . Years of education: Not on file  . Highest education level: Not on file  Occupational History  . Occupation: Retired  Tobacco Use  . Smoking status: Former Smoker    Packs/day: 1.00    Years: 50.00    Pack years: 50.00    Types: Cigarettes    Quit date: 05/16/1998    Years since quitting: 21.1  . Smokeless tobacco: Never Used  Substance and Sexual Activity  . Alcohol use: No    Alcohol/week: 0.0 standard drinks  . Drug use: No  . Sexual activity: Yes    Birth control/protection: Post-menopausal  Other Topics Concern  . Not on file  Social History Narrative   Lives at [place] [facility] alone (most of the time).  Three grandchildren and a one year old great grandchild   Social Determinants of Health   Financial  Resource Strain:   . Difficulty of Paying Living Expenses: Not on file  Food Insecurity:   . Worried About Charity fundraiser in the Last Year: Not on file  . Ran Out of Food in the Last Year: Not on file  Transportation Needs:   . Lack of Transportation (Medical): Not on file  . Lack of Transportation (Non-Medical): Not on file  Physical Activity:   . Days of Exercise per Week: Not on file  . Minutes of Exercise per Session: Not on file  Stress:   . Feeling of Stress : Not on file  Social Connections:   . Frequency of Communication with Friends and Family: Not on file  . Frequency of Social Gatherings with Friends and Family: Not on file  . Attends Religious Services: Not on file  . Active Member of Clubs or Organizations: Not on file  . Attends Archivist Meetings: Not on file  . Marital Status: Not on file  Intimate Partner Violence:   . Fear of Current or Ex-Partner: Not on file  . Emotionally Abused: Not on file  . Physically Abused: Not on file  . Sexually Abused: Not on file     Family History  Problem Relation Age of Onset  . Breast cancer Mother        Died age 41  . Cancer Mother 77       Breast  . Heart disease Mother   . Hyperlipidemia Mother   . Hypertension Mother   . Heart attack Mother     ROS:  Extended bleeding time after dialysis  Physical Examination  Vitals:   07/22/19 0812  BP: (!) 147/62  Pulse: 86  Resp: 16  Temp: (!) 97.2 F (36.2 C)  SpO2: 100%   Body mass index is 37.12 kg/m.  General:  nad HENT: WNL, normocephalic Pulmonary: normal non-labored breathing Cardiac: Palpable radial pulses bilaterally Extremities: Right forearm loop graft with arterial limb laterally no ulceration Neurologic: A&O X 3; Appropriate Affect ; SENSATION: normal;    CBC    Component Value Date/Time   WBC 7.8 06/13/2019 0502   RBC 4.10 06/13/2019 0502   HGB 10.9 (L) 07/22/2019 0852   HCT 32.0 (L) 07/22/2019 0852   PLT 229 06/13/2019  0502   MCV 79.5 (L) 06/13/2019 0502   MCH 25.1 (L) 06/13/2019 0502   MCHC 31.6 06/13/2019 0502   RDW 15.9 (H) 06/13/2019 0502   LYMPHSABS 0.8 06/12/2019 0450   MONOABS 0.9 06/12/2019 0450   EOSABS 0.4 06/12/2019 0450   BASOSABS 0.0 06/12/2019 0450    BMET    Component Value Date/Time   NA 133 (L) 07/22/2019 0852   K 3.3 (L) 07/22/2019 0927   CL 98 07/22/2019 0852   CO2 29 06/14/2019 0621   GLUCOSE 128 (H) 07/22/2019 0852   BUN 53 (H) 07/22/2019 0852   CREATININE 7.90 (H) 07/22/2019 0852   CALCIUM 8.7 (L) 06/14/2019 0621   GFRNONAA 10 (L) 06/14/2019 0621   GFRAA 11 (L) 06/14/2019 0621    COAGS: Lab Results  Component Value Date   INR 3.1 (H) 06/11/2019   INR 1.0 05/09/2019   INR 1.12 04/06/2018     ASSESSMENT/PLAN: This is a 74 y.o. female with end-stage renal disease and extended bleeding time following dialysis.  We will plan for fistulogram today with possible invention.  Sony Schlarb C. Donzetta Matters, MD [facility] [place] Office: [phone] Pager: [phone]

## 2019-07-22 NOTE — Progress Notes (Signed)
Annie Main, RN notified of K+ and order noted

## 2019-07-23 DIAGNOSIS — N186 End stage renal disease: Secondary | ICD-10-CM | POA: Diagnosis not present

## 2019-07-23 DIAGNOSIS — N2581 Secondary hyperparathyroidism of renal origin: Secondary | ICD-10-CM | POA: Diagnosis not present

## 2019-07-23 DIAGNOSIS — Z7689 Persons encountering health services in other specified circumstances: Secondary | ICD-10-CM | POA: Diagnosis not present

## 2019-07-23 DIAGNOSIS — Z992 Dependence on renal dialysis: Secondary | ICD-10-CM | POA: Diagnosis not present

## 2019-07-24 DIAGNOSIS — L89312 Pressure ulcer of right buttock, stage 2: Secondary | ICD-10-CM | POA: Diagnosis not present

## 2019-07-24 DIAGNOSIS — K219 Gastro-esophageal reflux disease without esophagitis: Secondary | ICD-10-CM | POA: Diagnosis not present

## 2019-07-24 DIAGNOSIS — E785 Hyperlipidemia, unspecified: Secondary | ICD-10-CM | POA: Diagnosis not present

## 2019-07-24 DIAGNOSIS — I509 Heart failure, unspecified: Secondary | ICD-10-CM | POA: Diagnosis not present

## 2019-07-24 DIAGNOSIS — I132 Hypertensive heart and chronic kidney disease with heart failure and with stage 5 chronic kidney disease, or end stage renal disease: Secondary | ICD-10-CM | POA: Diagnosis not present

## 2019-07-24 DIAGNOSIS — I272 Pulmonary hypertension, unspecified: Secondary | ICD-10-CM | POA: Diagnosis not present

## 2019-07-24 DIAGNOSIS — N186 End stage renal disease: Secondary | ICD-10-CM | POA: Diagnosis not present

## 2019-07-24 DIAGNOSIS — Z853 Personal history of malignant neoplasm of breast: Secondary | ICD-10-CM | POA: Diagnosis not present

## 2019-07-24 DIAGNOSIS — I4891 Unspecified atrial fibrillation: Secondary | ICD-10-CM | POA: Diagnosis not present

## 2019-07-24 DIAGNOSIS — L89892 Pressure ulcer of other site, stage 2: Secondary | ICD-10-CM | POA: Diagnosis not present

## 2019-07-24 DIAGNOSIS — Z992 Dependence on renal dialysis: Secondary | ICD-10-CM | POA: Diagnosis not present

## 2019-07-24 DIAGNOSIS — E079 Disorder of thyroid, unspecified: Secondary | ICD-10-CM | POA: Diagnosis not present

## 2019-07-24 DIAGNOSIS — M1990 Unspecified osteoarthritis, unspecified site: Secondary | ICD-10-CM | POA: Diagnosis not present

## 2019-07-24 DIAGNOSIS — M48062 Spinal stenosis, lumbar region with neurogenic claudication: Secondary | ICD-10-CM | POA: Diagnosis not present

## 2019-07-24 DIAGNOSIS — I447 Left bundle-branch block, unspecified: Secondary | ICD-10-CM | POA: Diagnosis not present

## 2019-07-24 DIAGNOSIS — Z7901 Long term (current) use of anticoagulants: Secondary | ICD-10-CM | POA: Diagnosis not present

## 2019-07-24 DIAGNOSIS — G8929 Other chronic pain: Secondary | ICD-10-CM | POA: Diagnosis not present

## 2019-07-24 DIAGNOSIS — D631 Anemia in chronic kidney disease: Secondary | ICD-10-CM | POA: Diagnosis not present

## 2019-07-24 DIAGNOSIS — I25118 Atherosclerotic heart disease of native coronary artery with other forms of angina pectoris: Secondary | ICD-10-CM | POA: Diagnosis not present

## 2019-07-24 DIAGNOSIS — Z794 Long term (current) use of insulin: Secondary | ICD-10-CM | POA: Diagnosis not present

## 2019-07-24 DIAGNOSIS — E1122 Type 2 diabetes mellitus with diabetic chronic kidney disease: Secondary | ICD-10-CM | POA: Diagnosis not present

## 2019-07-24 DIAGNOSIS — L89620 Pressure ulcer of left heel, unstageable: Secondary | ICD-10-CM | POA: Diagnosis not present

## 2019-07-25 DIAGNOSIS — N2581 Secondary hyperparathyroidism of renal origin: Secondary | ICD-10-CM | POA: Diagnosis not present

## 2019-07-25 DIAGNOSIS — N186 End stage renal disease: Secondary | ICD-10-CM | POA: Diagnosis not present

## 2019-07-25 DIAGNOSIS — Z7689 Persons encountering health services in other specified circumstances: Secondary | ICD-10-CM | POA: Diagnosis not present

## 2019-07-25 DIAGNOSIS — Z992 Dependence on renal dialysis: Secondary | ICD-10-CM | POA: Diagnosis not present

## 2019-07-26 DIAGNOSIS — Z853 Personal history of malignant neoplasm of breast: Secondary | ICD-10-CM | POA: Diagnosis not present

## 2019-07-26 DIAGNOSIS — M48062 Spinal stenosis, lumbar region with neurogenic claudication: Secondary | ICD-10-CM | POA: Diagnosis not present

## 2019-07-26 DIAGNOSIS — L89892 Pressure ulcer of other site, stage 2: Secondary | ICD-10-CM | POA: Diagnosis not present

## 2019-07-26 DIAGNOSIS — M1990 Unspecified osteoarthritis, unspecified site: Secondary | ICD-10-CM | POA: Diagnosis not present

## 2019-07-26 DIAGNOSIS — I25118 Atherosclerotic heart disease of native coronary artery with other forms of angina pectoris: Secondary | ICD-10-CM | POA: Diagnosis not present

## 2019-07-26 DIAGNOSIS — Z992 Dependence on renal dialysis: Secondary | ICD-10-CM | POA: Diagnosis not present

## 2019-07-26 DIAGNOSIS — I272 Pulmonary hypertension, unspecified: Secondary | ICD-10-CM | POA: Diagnosis not present

## 2019-07-26 DIAGNOSIS — N186 End stage renal disease: Secondary | ICD-10-CM | POA: Diagnosis not present

## 2019-07-26 DIAGNOSIS — L89620 Pressure ulcer of left heel, unstageable: Secondary | ICD-10-CM | POA: Diagnosis not present

## 2019-07-26 DIAGNOSIS — I447 Left bundle-branch block, unspecified: Secondary | ICD-10-CM | POA: Diagnosis not present

## 2019-07-26 DIAGNOSIS — Z794 Long term (current) use of insulin: Secondary | ICD-10-CM | POA: Diagnosis not present

## 2019-07-26 DIAGNOSIS — E079 Disorder of thyroid, unspecified: Secondary | ICD-10-CM | POA: Diagnosis not present

## 2019-07-26 DIAGNOSIS — E1122 Type 2 diabetes mellitus with diabetic chronic kidney disease: Secondary | ICD-10-CM | POA: Diagnosis not present

## 2019-07-26 DIAGNOSIS — E785 Hyperlipidemia, unspecified: Secondary | ICD-10-CM | POA: Diagnosis not present

## 2019-07-26 DIAGNOSIS — I132 Hypertensive heart and chronic kidney disease with heart failure and with stage 5 chronic kidney disease, or end stage renal disease: Secondary | ICD-10-CM | POA: Diagnosis not present

## 2019-07-26 DIAGNOSIS — Z7901 Long term (current) use of anticoagulants: Secondary | ICD-10-CM | POA: Diagnosis not present

## 2019-07-26 DIAGNOSIS — L89312 Pressure ulcer of right buttock, stage 2: Secondary | ICD-10-CM | POA: Diagnosis not present

## 2019-07-26 DIAGNOSIS — K219 Gastro-esophageal reflux disease without esophagitis: Secondary | ICD-10-CM | POA: Diagnosis not present

## 2019-07-26 DIAGNOSIS — G8929 Other chronic pain: Secondary | ICD-10-CM | POA: Diagnosis not present

## 2019-07-26 DIAGNOSIS — D631 Anemia in chronic kidney disease: Secondary | ICD-10-CM | POA: Diagnosis not present

## 2019-07-26 DIAGNOSIS — I509 Heart failure, unspecified: Secondary | ICD-10-CM | POA: Diagnosis not present

## 2019-07-26 DIAGNOSIS — I4891 Unspecified atrial fibrillation: Secondary | ICD-10-CM | POA: Diagnosis not present

## 2019-07-27 DIAGNOSIS — N2581 Secondary hyperparathyroidism of renal origin: Secondary | ICD-10-CM | POA: Diagnosis not present

## 2019-07-27 DIAGNOSIS — Z992 Dependence on renal dialysis: Secondary | ICD-10-CM | POA: Diagnosis not present

## 2019-07-27 DIAGNOSIS — Z7689 Persons encountering health services in other specified circumstances: Secondary | ICD-10-CM | POA: Diagnosis not present

## 2019-07-27 DIAGNOSIS — N186 End stage renal disease: Secondary | ICD-10-CM | POA: Diagnosis not present

## 2019-07-29 DIAGNOSIS — I4891 Unspecified atrial fibrillation: Secondary | ICD-10-CM | POA: Diagnosis not present

## 2019-07-29 DIAGNOSIS — Z794 Long term (current) use of insulin: Secondary | ICD-10-CM | POA: Diagnosis not present

## 2019-07-29 DIAGNOSIS — I272 Pulmonary hypertension, unspecified: Secondary | ICD-10-CM | POA: Diagnosis not present

## 2019-07-29 DIAGNOSIS — M48062 Spinal stenosis, lumbar region with neurogenic claudication: Secondary | ICD-10-CM | POA: Diagnosis not present

## 2019-07-29 DIAGNOSIS — E1122 Type 2 diabetes mellitus with diabetic chronic kidney disease: Secondary | ICD-10-CM | POA: Diagnosis not present

## 2019-07-29 DIAGNOSIS — L89892 Pressure ulcer of other site, stage 2: Secondary | ICD-10-CM | POA: Diagnosis not present

## 2019-07-29 DIAGNOSIS — L89620 Pressure ulcer of left heel, unstageable: Secondary | ICD-10-CM | POA: Diagnosis not present

## 2019-07-29 DIAGNOSIS — Z853 Personal history of malignant neoplasm of breast: Secondary | ICD-10-CM | POA: Diagnosis not present

## 2019-07-29 DIAGNOSIS — K219 Gastro-esophageal reflux disease without esophagitis: Secondary | ICD-10-CM | POA: Diagnosis not present

## 2019-07-29 DIAGNOSIS — I509 Heart failure, unspecified: Secondary | ICD-10-CM | POA: Diagnosis not present

## 2019-07-29 DIAGNOSIS — I25118 Atherosclerotic heart disease of native coronary artery with other forms of angina pectoris: Secondary | ICD-10-CM | POA: Diagnosis not present

## 2019-07-29 DIAGNOSIS — E079 Disorder of thyroid, unspecified: Secondary | ICD-10-CM | POA: Diagnosis not present

## 2019-07-29 DIAGNOSIS — N186 End stage renal disease: Secondary | ICD-10-CM | POA: Diagnosis not present

## 2019-07-29 DIAGNOSIS — I447 Left bundle-branch block, unspecified: Secondary | ICD-10-CM | POA: Diagnosis not present

## 2019-07-29 DIAGNOSIS — Z7901 Long term (current) use of anticoagulants: Secondary | ICD-10-CM | POA: Diagnosis not present

## 2019-07-29 DIAGNOSIS — M1990 Unspecified osteoarthritis, unspecified site: Secondary | ICD-10-CM | POA: Diagnosis not present

## 2019-07-29 DIAGNOSIS — E785 Hyperlipidemia, unspecified: Secondary | ICD-10-CM | POA: Diagnosis not present

## 2019-07-29 DIAGNOSIS — I132 Hypertensive heart and chronic kidney disease with heart failure and with stage 5 chronic kidney disease, or end stage renal disease: Secondary | ICD-10-CM | POA: Diagnosis not present

## 2019-07-29 DIAGNOSIS — Z992 Dependence on renal dialysis: Secondary | ICD-10-CM | POA: Diagnosis not present

## 2019-07-29 DIAGNOSIS — L89312 Pressure ulcer of right buttock, stage 2: Secondary | ICD-10-CM | POA: Diagnosis not present

## 2019-07-29 DIAGNOSIS — G8929 Other chronic pain: Secondary | ICD-10-CM | POA: Diagnosis not present

## 2019-07-29 DIAGNOSIS — D631 Anemia in chronic kidney disease: Secondary | ICD-10-CM | POA: Diagnosis not present

## 2019-07-30 DIAGNOSIS — Z7689 Persons encountering health services in other specified circumstances: Secondary | ICD-10-CM | POA: Diagnosis not present

## 2019-07-30 DIAGNOSIS — Z992 Dependence on renal dialysis: Secondary | ICD-10-CM | POA: Diagnosis not present

## 2019-07-30 DIAGNOSIS — N2581 Secondary hyperparathyroidism of renal origin: Secondary | ICD-10-CM | POA: Diagnosis not present

## 2019-07-30 DIAGNOSIS — N186 End stage renal disease: Secondary | ICD-10-CM | POA: Diagnosis not present

## 2019-08-02 DIAGNOSIS — I132 Hypertensive heart and chronic kidney disease with heart failure and with stage 5 chronic kidney disease, or end stage renal disease: Secondary | ICD-10-CM | POA: Diagnosis not present

## 2019-08-02 DIAGNOSIS — I272 Pulmonary hypertension, unspecified: Secondary | ICD-10-CM | POA: Diagnosis not present

## 2019-08-02 DIAGNOSIS — G8929 Other chronic pain: Secondary | ICD-10-CM | POA: Diagnosis not present

## 2019-08-02 DIAGNOSIS — L89892 Pressure ulcer of other site, stage 2: Secondary | ICD-10-CM | POA: Diagnosis not present

## 2019-08-02 DIAGNOSIS — M1990 Unspecified osteoarthritis, unspecified site: Secondary | ICD-10-CM | POA: Diagnosis not present

## 2019-08-02 DIAGNOSIS — Z7901 Long term (current) use of anticoagulants: Secondary | ICD-10-CM | POA: Diagnosis not present

## 2019-08-02 DIAGNOSIS — Z853 Personal history of malignant neoplasm of breast: Secondary | ICD-10-CM | POA: Diagnosis not present

## 2019-08-02 DIAGNOSIS — E1122 Type 2 diabetes mellitus with diabetic chronic kidney disease: Secondary | ICD-10-CM | POA: Diagnosis not present

## 2019-08-02 DIAGNOSIS — K219 Gastro-esophageal reflux disease without esophagitis: Secondary | ICD-10-CM | POA: Diagnosis not present

## 2019-08-02 DIAGNOSIS — M48062 Spinal stenosis, lumbar region with neurogenic claudication: Secondary | ICD-10-CM | POA: Diagnosis not present

## 2019-08-02 DIAGNOSIS — E079 Disorder of thyroid, unspecified: Secondary | ICD-10-CM | POA: Diagnosis not present

## 2019-08-02 DIAGNOSIS — D631 Anemia in chronic kidney disease: Secondary | ICD-10-CM | POA: Diagnosis not present

## 2019-08-02 DIAGNOSIS — L89312 Pressure ulcer of right buttock, stage 2: Secondary | ICD-10-CM | POA: Diagnosis not present

## 2019-08-02 DIAGNOSIS — Z794 Long term (current) use of insulin: Secondary | ICD-10-CM | POA: Diagnosis not present

## 2019-08-02 DIAGNOSIS — Z992 Dependence on renal dialysis: Secondary | ICD-10-CM | POA: Diagnosis not present

## 2019-08-02 DIAGNOSIS — E785 Hyperlipidemia, unspecified: Secondary | ICD-10-CM | POA: Diagnosis not present

## 2019-08-02 DIAGNOSIS — I4891 Unspecified atrial fibrillation: Secondary | ICD-10-CM | POA: Diagnosis not present

## 2019-08-02 DIAGNOSIS — L89620 Pressure ulcer of left heel, unstageable: Secondary | ICD-10-CM | POA: Diagnosis not present

## 2019-08-02 DIAGNOSIS — I25118 Atherosclerotic heart disease of native coronary artery with other forms of angina pectoris: Secondary | ICD-10-CM | POA: Diagnosis not present

## 2019-08-02 DIAGNOSIS — N186 End stage renal disease: Secondary | ICD-10-CM | POA: Diagnosis not present

## 2019-08-02 DIAGNOSIS — I509 Heart failure, unspecified: Secondary | ICD-10-CM | POA: Diagnosis not present

## 2019-08-02 DIAGNOSIS — I447 Left bundle-branch block, unspecified: Secondary | ICD-10-CM | POA: Diagnosis not present

## 2019-08-03 DIAGNOSIS — Z7689 Persons encountering health services in other specified circumstances: Secondary | ICD-10-CM | POA: Diagnosis not present

## 2019-08-03 DIAGNOSIS — N2581 Secondary hyperparathyroidism of renal origin: Secondary | ICD-10-CM | POA: Diagnosis not present

## 2019-08-03 DIAGNOSIS — N186 End stage renal disease: Secondary | ICD-10-CM | POA: Diagnosis not present

## 2019-08-03 DIAGNOSIS — Z992 Dependence on renal dialysis: Secondary | ICD-10-CM | POA: Diagnosis not present

## 2019-08-05 DIAGNOSIS — K219 Gastro-esophageal reflux disease without esophagitis: Secondary | ICD-10-CM | POA: Diagnosis not present

## 2019-08-05 DIAGNOSIS — I4891 Unspecified atrial fibrillation: Secondary | ICD-10-CM | POA: Diagnosis not present

## 2019-08-05 DIAGNOSIS — E1122 Type 2 diabetes mellitus with diabetic chronic kidney disease: Secondary | ICD-10-CM | POA: Diagnosis not present

## 2019-08-05 DIAGNOSIS — Z7901 Long term (current) use of anticoagulants: Secondary | ICD-10-CM | POA: Diagnosis not present

## 2019-08-05 DIAGNOSIS — L89312 Pressure ulcer of right buttock, stage 2: Secondary | ICD-10-CM | POA: Diagnosis not present

## 2019-08-05 DIAGNOSIS — E079 Disorder of thyroid, unspecified: Secondary | ICD-10-CM | POA: Diagnosis not present

## 2019-08-05 DIAGNOSIS — M48062 Spinal stenosis, lumbar region with neurogenic claudication: Secondary | ICD-10-CM | POA: Diagnosis not present

## 2019-08-05 DIAGNOSIS — I25118 Atherosclerotic heart disease of native coronary artery with other forms of angina pectoris: Secondary | ICD-10-CM | POA: Diagnosis not present

## 2019-08-05 DIAGNOSIS — I447 Left bundle-branch block, unspecified: Secondary | ICD-10-CM | POA: Diagnosis not present

## 2019-08-05 DIAGNOSIS — I272 Pulmonary hypertension, unspecified: Secondary | ICD-10-CM | POA: Diagnosis not present

## 2019-08-05 DIAGNOSIS — D631 Anemia in chronic kidney disease: Secondary | ICD-10-CM | POA: Diagnosis not present

## 2019-08-05 DIAGNOSIS — L89892 Pressure ulcer of other site, stage 2: Secondary | ICD-10-CM | POA: Diagnosis not present

## 2019-08-05 DIAGNOSIS — N186 End stage renal disease: Secondary | ICD-10-CM | POA: Diagnosis not present

## 2019-08-05 DIAGNOSIS — I509 Heart failure, unspecified: Secondary | ICD-10-CM | POA: Diagnosis not present

## 2019-08-05 DIAGNOSIS — E785 Hyperlipidemia, unspecified: Secondary | ICD-10-CM | POA: Diagnosis not present

## 2019-08-05 DIAGNOSIS — Z992 Dependence on renal dialysis: Secondary | ICD-10-CM | POA: Diagnosis not present

## 2019-08-05 DIAGNOSIS — Z853 Personal history of malignant neoplasm of breast: Secondary | ICD-10-CM | POA: Diagnosis not present

## 2019-08-05 DIAGNOSIS — G8929 Other chronic pain: Secondary | ICD-10-CM | POA: Diagnosis not present

## 2019-08-05 DIAGNOSIS — Z794 Long term (current) use of insulin: Secondary | ICD-10-CM | POA: Diagnosis not present

## 2019-08-05 DIAGNOSIS — L89620 Pressure ulcer of left heel, unstageable: Secondary | ICD-10-CM | POA: Diagnosis not present

## 2019-08-05 DIAGNOSIS — I132 Hypertensive heart and chronic kidney disease with heart failure and with stage 5 chronic kidney disease, or end stage renal disease: Secondary | ICD-10-CM | POA: Diagnosis not present

## 2019-08-05 DIAGNOSIS — M1990 Unspecified osteoarthritis, unspecified site: Secondary | ICD-10-CM | POA: Diagnosis not present

## 2019-08-06 DIAGNOSIS — Z992 Dependence on renal dialysis: Secondary | ICD-10-CM | POA: Diagnosis not present

## 2019-08-06 DIAGNOSIS — D509 Iron deficiency anemia, unspecified: Secondary | ICD-10-CM | POA: Diagnosis not present

## 2019-08-06 DIAGNOSIS — N2581 Secondary hyperparathyroidism of renal origin: Secondary | ICD-10-CM | POA: Diagnosis not present

## 2019-08-06 DIAGNOSIS — Z7689 Persons encountering health services in other specified circumstances: Secondary | ICD-10-CM | POA: Diagnosis not present

## 2019-08-06 DIAGNOSIS — N186 End stage renal disease: Secondary | ICD-10-CM | POA: Diagnosis not present

## 2019-08-08 DIAGNOSIS — N186 End stage renal disease: Secondary | ICD-10-CM | POA: Diagnosis not present

## 2019-08-08 DIAGNOSIS — N2581 Secondary hyperparathyroidism of renal origin: Secondary | ICD-10-CM | POA: Diagnosis not present

## 2019-08-08 DIAGNOSIS — Z7689 Persons encountering health services in other specified circumstances: Secondary | ICD-10-CM | POA: Diagnosis not present

## 2019-08-08 DIAGNOSIS — Z992 Dependence on renal dialysis: Secondary | ICD-10-CM | POA: Diagnosis not present

## 2019-08-09 DIAGNOSIS — L89620 Pressure ulcer of left heel, unstageable: Secondary | ICD-10-CM | POA: Diagnosis not present

## 2019-08-09 DIAGNOSIS — D631 Anemia in chronic kidney disease: Secondary | ICD-10-CM | POA: Diagnosis not present

## 2019-08-09 DIAGNOSIS — I447 Left bundle-branch block, unspecified: Secondary | ICD-10-CM | POA: Diagnosis not present

## 2019-08-09 DIAGNOSIS — G8929 Other chronic pain: Secondary | ICD-10-CM | POA: Diagnosis not present

## 2019-08-09 DIAGNOSIS — Z853 Personal history of malignant neoplasm of breast: Secondary | ICD-10-CM | POA: Diagnosis not present

## 2019-08-09 DIAGNOSIS — K219 Gastro-esophageal reflux disease without esophagitis: Secondary | ICD-10-CM | POA: Diagnosis not present

## 2019-08-09 DIAGNOSIS — I272 Pulmonary hypertension, unspecified: Secondary | ICD-10-CM | POA: Diagnosis not present

## 2019-08-09 DIAGNOSIS — L89312 Pressure ulcer of right buttock, stage 2: Secondary | ICD-10-CM | POA: Diagnosis not present

## 2019-08-09 DIAGNOSIS — N186 End stage renal disease: Secondary | ICD-10-CM | POA: Diagnosis not present

## 2019-08-09 DIAGNOSIS — I25118 Atherosclerotic heart disease of native coronary artery with other forms of angina pectoris: Secondary | ICD-10-CM | POA: Diagnosis not present

## 2019-08-09 DIAGNOSIS — I4891 Unspecified atrial fibrillation: Secondary | ICD-10-CM | POA: Diagnosis not present

## 2019-08-09 DIAGNOSIS — E785 Hyperlipidemia, unspecified: Secondary | ICD-10-CM | POA: Diagnosis not present

## 2019-08-09 DIAGNOSIS — L89892 Pressure ulcer of other site, stage 2: Secondary | ICD-10-CM | POA: Diagnosis not present

## 2019-08-09 DIAGNOSIS — M48062 Spinal stenosis, lumbar region with neurogenic claudication: Secondary | ICD-10-CM | POA: Diagnosis not present

## 2019-08-09 DIAGNOSIS — I509 Heart failure, unspecified: Secondary | ICD-10-CM | POA: Diagnosis not present

## 2019-08-09 DIAGNOSIS — I132 Hypertensive heart and chronic kidney disease with heart failure and with stage 5 chronic kidney disease, or end stage renal disease: Secondary | ICD-10-CM | POA: Diagnosis not present

## 2019-08-09 DIAGNOSIS — Z794 Long term (current) use of insulin: Secondary | ICD-10-CM | POA: Diagnosis not present

## 2019-08-09 DIAGNOSIS — E079 Disorder of thyroid, unspecified: Secondary | ICD-10-CM | POA: Diagnosis not present

## 2019-08-09 DIAGNOSIS — E1122 Type 2 diabetes mellitus with diabetic chronic kidney disease: Secondary | ICD-10-CM | POA: Diagnosis not present

## 2019-08-09 DIAGNOSIS — M1990 Unspecified osteoarthritis, unspecified site: Secondary | ICD-10-CM | POA: Diagnosis not present

## 2019-08-09 DIAGNOSIS — Z7901 Long term (current) use of anticoagulants: Secondary | ICD-10-CM | POA: Diagnosis not present

## 2019-08-09 DIAGNOSIS — Z992 Dependence on renal dialysis: Secondary | ICD-10-CM | POA: Diagnosis not present

## 2019-08-10 DIAGNOSIS — Z7689 Persons encountering health services in other specified circumstances: Secondary | ICD-10-CM | POA: Diagnosis not present

## 2019-08-10 DIAGNOSIS — N2581 Secondary hyperparathyroidism of renal origin: Secondary | ICD-10-CM | POA: Diagnosis not present

## 2019-08-10 DIAGNOSIS — N186 End stage renal disease: Secondary | ICD-10-CM | POA: Diagnosis not present

## 2019-08-10 DIAGNOSIS — Z992 Dependence on renal dialysis: Secondary | ICD-10-CM | POA: Diagnosis not present

## 2019-08-13 DIAGNOSIS — Z7901 Long term (current) use of anticoagulants: Secondary | ICD-10-CM | POA: Diagnosis not present

## 2019-08-13 DIAGNOSIS — M48062 Spinal stenosis, lumbar region with neurogenic claudication: Secondary | ICD-10-CM | POA: Diagnosis not present

## 2019-08-13 DIAGNOSIS — G8929 Other chronic pain: Secondary | ICD-10-CM | POA: Diagnosis not present

## 2019-08-13 DIAGNOSIS — K219 Gastro-esophageal reflux disease without esophagitis: Secondary | ICD-10-CM | POA: Diagnosis not present

## 2019-08-13 DIAGNOSIS — Z794 Long term (current) use of insulin: Secondary | ICD-10-CM | POA: Diagnosis not present

## 2019-08-13 DIAGNOSIS — I447 Left bundle-branch block, unspecified: Secondary | ICD-10-CM | POA: Diagnosis not present

## 2019-08-13 DIAGNOSIS — Z853 Personal history of malignant neoplasm of breast: Secondary | ICD-10-CM | POA: Diagnosis not present

## 2019-08-13 DIAGNOSIS — Z992 Dependence on renal dialysis: Secondary | ICD-10-CM | POA: Diagnosis not present

## 2019-08-13 DIAGNOSIS — I25118 Atherosclerotic heart disease of native coronary artery with other forms of angina pectoris: Secondary | ICD-10-CM | POA: Diagnosis not present

## 2019-08-13 DIAGNOSIS — I4891 Unspecified atrial fibrillation: Secondary | ICD-10-CM | POA: Diagnosis not present

## 2019-08-13 DIAGNOSIS — L89312 Pressure ulcer of right buttock, stage 2: Secondary | ICD-10-CM | POA: Diagnosis not present

## 2019-08-13 DIAGNOSIS — L89620 Pressure ulcer of left heel, unstageable: Secondary | ICD-10-CM | POA: Diagnosis not present

## 2019-08-13 DIAGNOSIS — N186 End stage renal disease: Secondary | ICD-10-CM | POA: Diagnosis not present

## 2019-08-13 DIAGNOSIS — I509 Heart failure, unspecified: Secondary | ICD-10-CM | POA: Diagnosis not present

## 2019-08-13 DIAGNOSIS — L89892 Pressure ulcer of other site, stage 2: Secondary | ICD-10-CM | POA: Diagnosis not present

## 2019-08-13 DIAGNOSIS — E079 Disorder of thyroid, unspecified: Secondary | ICD-10-CM | POA: Diagnosis not present

## 2019-08-13 DIAGNOSIS — E1122 Type 2 diabetes mellitus with diabetic chronic kidney disease: Secondary | ICD-10-CM | POA: Diagnosis not present

## 2019-08-13 DIAGNOSIS — I272 Pulmonary hypertension, unspecified: Secondary | ICD-10-CM | POA: Diagnosis not present

## 2019-08-13 DIAGNOSIS — D631 Anemia in chronic kidney disease: Secondary | ICD-10-CM | POA: Diagnosis not present

## 2019-08-13 DIAGNOSIS — M1990 Unspecified osteoarthritis, unspecified site: Secondary | ICD-10-CM | POA: Diagnosis not present

## 2019-08-13 DIAGNOSIS — E785 Hyperlipidemia, unspecified: Secondary | ICD-10-CM | POA: Diagnosis not present

## 2019-08-13 DIAGNOSIS — N2581 Secondary hyperparathyroidism of renal origin: Secondary | ICD-10-CM | POA: Diagnosis not present

## 2019-08-13 DIAGNOSIS — I132 Hypertensive heart and chronic kidney disease with heart failure and with stage 5 chronic kidney disease, or end stage renal disease: Secondary | ICD-10-CM | POA: Diagnosis not present

## 2019-08-14 DIAGNOSIS — I447 Left bundle-branch block, unspecified: Secondary | ICD-10-CM | POA: Diagnosis not present

## 2019-08-14 DIAGNOSIS — E1122 Type 2 diabetes mellitus with diabetic chronic kidney disease: Secondary | ICD-10-CM | POA: Diagnosis not present

## 2019-08-14 DIAGNOSIS — Z7901 Long term (current) use of anticoagulants: Secondary | ICD-10-CM | POA: Diagnosis not present

## 2019-08-14 DIAGNOSIS — N186 End stage renal disease: Secondary | ICD-10-CM | POA: Diagnosis not present

## 2019-08-14 DIAGNOSIS — G8929 Other chronic pain: Secondary | ICD-10-CM | POA: Diagnosis not present

## 2019-08-14 DIAGNOSIS — I132 Hypertensive heart and chronic kidney disease with heart failure and with stage 5 chronic kidney disease, or end stage renal disease: Secondary | ICD-10-CM | POA: Diagnosis not present

## 2019-08-14 DIAGNOSIS — Z853 Personal history of malignant neoplasm of breast: Secondary | ICD-10-CM | POA: Diagnosis not present

## 2019-08-14 DIAGNOSIS — L89892 Pressure ulcer of other site, stage 2: Secondary | ICD-10-CM | POA: Diagnosis not present

## 2019-08-14 DIAGNOSIS — I509 Heart failure, unspecified: Secondary | ICD-10-CM | POA: Diagnosis not present

## 2019-08-14 DIAGNOSIS — E079 Disorder of thyroid, unspecified: Secondary | ICD-10-CM | POA: Diagnosis not present

## 2019-08-14 DIAGNOSIS — K219 Gastro-esophageal reflux disease without esophagitis: Secondary | ICD-10-CM | POA: Diagnosis not present

## 2019-08-14 DIAGNOSIS — D631 Anemia in chronic kidney disease: Secondary | ICD-10-CM | POA: Diagnosis not present

## 2019-08-14 DIAGNOSIS — I25118 Atherosclerotic heart disease of native coronary artery with other forms of angina pectoris: Secondary | ICD-10-CM | POA: Diagnosis not present

## 2019-08-14 DIAGNOSIS — I4891 Unspecified atrial fibrillation: Secondary | ICD-10-CM | POA: Diagnosis not present

## 2019-08-14 DIAGNOSIS — Z794 Long term (current) use of insulin: Secondary | ICD-10-CM | POA: Diagnosis not present

## 2019-08-14 DIAGNOSIS — E785 Hyperlipidemia, unspecified: Secondary | ICD-10-CM | POA: Diagnosis not present

## 2019-08-14 DIAGNOSIS — I272 Pulmonary hypertension, unspecified: Secondary | ICD-10-CM | POA: Diagnosis not present

## 2019-08-14 DIAGNOSIS — M48062 Spinal stenosis, lumbar region with neurogenic claudication: Secondary | ICD-10-CM | POA: Diagnosis not present

## 2019-08-14 DIAGNOSIS — M1990 Unspecified osteoarthritis, unspecified site: Secondary | ICD-10-CM | POA: Diagnosis not present

## 2019-08-14 DIAGNOSIS — L89312 Pressure ulcer of right buttock, stage 2: Secondary | ICD-10-CM | POA: Diagnosis not present

## 2019-08-14 DIAGNOSIS — Z992 Dependence on renal dialysis: Secondary | ICD-10-CM | POA: Diagnosis not present

## 2019-08-14 DIAGNOSIS — L89620 Pressure ulcer of left heel, unstageable: Secondary | ICD-10-CM | POA: Diagnosis not present

## 2019-08-15 DIAGNOSIS — R5381 Other malaise: Secondary | ICD-10-CM | POA: Diagnosis not present

## 2019-08-15 DIAGNOSIS — R531 Weakness: Secondary | ICD-10-CM | POA: Diagnosis not present

## 2019-08-15 DIAGNOSIS — Z7689 Persons encountering health services in other specified circumstances: Secondary | ICD-10-CM | POA: Diagnosis not present

## 2019-08-15 DIAGNOSIS — N186 End stage renal disease: Secondary | ICD-10-CM | POA: Diagnosis not present

## 2019-08-15 DIAGNOSIS — Z992 Dependence on renal dialysis: Secondary | ICD-10-CM | POA: Diagnosis not present

## 2019-08-15 DIAGNOSIS — N2581 Secondary hyperparathyroidism of renal origin: Secondary | ICD-10-CM | POA: Diagnosis not present

## 2019-08-16 DIAGNOSIS — Z7901 Long term (current) use of anticoagulants: Secondary | ICD-10-CM | POA: Diagnosis not present

## 2019-08-16 DIAGNOSIS — M1990 Unspecified osteoarthritis, unspecified site: Secondary | ICD-10-CM | POA: Diagnosis not present

## 2019-08-16 DIAGNOSIS — M48062 Spinal stenosis, lumbar region with neurogenic claudication: Secondary | ICD-10-CM | POA: Diagnosis not present

## 2019-08-16 DIAGNOSIS — L89620 Pressure ulcer of left heel, unstageable: Secondary | ICD-10-CM | POA: Diagnosis not present

## 2019-08-16 DIAGNOSIS — N186 End stage renal disease: Secondary | ICD-10-CM | POA: Diagnosis not present

## 2019-08-16 DIAGNOSIS — G8929 Other chronic pain: Secondary | ICD-10-CM | POA: Diagnosis not present

## 2019-08-16 DIAGNOSIS — I25118 Atherosclerotic heart disease of native coronary artery with other forms of angina pectoris: Secondary | ICD-10-CM | POA: Diagnosis not present

## 2019-08-16 DIAGNOSIS — I509 Heart failure, unspecified: Secondary | ICD-10-CM | POA: Diagnosis not present

## 2019-08-16 DIAGNOSIS — I132 Hypertensive heart and chronic kidney disease with heart failure and with stage 5 chronic kidney disease, or end stage renal disease: Secondary | ICD-10-CM | POA: Diagnosis not present

## 2019-08-16 DIAGNOSIS — E1122 Type 2 diabetes mellitus with diabetic chronic kidney disease: Secondary | ICD-10-CM | POA: Diagnosis not present

## 2019-08-16 DIAGNOSIS — I272 Pulmonary hypertension, unspecified: Secondary | ICD-10-CM | POA: Diagnosis not present

## 2019-08-16 DIAGNOSIS — L89312 Pressure ulcer of right buttock, stage 2: Secondary | ICD-10-CM | POA: Diagnosis not present

## 2019-08-16 DIAGNOSIS — L89892 Pressure ulcer of other site, stage 2: Secondary | ICD-10-CM | POA: Diagnosis not present

## 2019-08-16 DIAGNOSIS — K219 Gastro-esophageal reflux disease without esophagitis: Secondary | ICD-10-CM | POA: Diagnosis not present

## 2019-08-16 DIAGNOSIS — I447 Left bundle-branch block, unspecified: Secondary | ICD-10-CM | POA: Diagnosis not present

## 2019-08-16 DIAGNOSIS — I4891 Unspecified atrial fibrillation: Secondary | ICD-10-CM | POA: Diagnosis not present

## 2019-08-16 DIAGNOSIS — D631 Anemia in chronic kidney disease: Secondary | ICD-10-CM | POA: Diagnosis not present

## 2019-08-16 DIAGNOSIS — Z794 Long term (current) use of insulin: Secondary | ICD-10-CM | POA: Diagnosis not present

## 2019-08-16 DIAGNOSIS — E079 Disorder of thyroid, unspecified: Secondary | ICD-10-CM | POA: Diagnosis not present

## 2019-08-16 DIAGNOSIS — Z853 Personal history of malignant neoplasm of breast: Secondary | ICD-10-CM | POA: Diagnosis not present

## 2019-08-16 DIAGNOSIS — Z992 Dependence on renal dialysis: Secondary | ICD-10-CM | POA: Diagnosis not present

## 2019-08-16 DIAGNOSIS — E785 Hyperlipidemia, unspecified: Secondary | ICD-10-CM | POA: Diagnosis not present

## 2019-08-17 DIAGNOSIS — Z7689 Persons encountering health services in other specified circumstances: Secondary | ICD-10-CM | POA: Diagnosis not present

## 2019-08-17 DIAGNOSIS — Z992 Dependence on renal dialysis: Secondary | ICD-10-CM | POA: Diagnosis not present

## 2019-08-17 DIAGNOSIS — N186 End stage renal disease: Secondary | ICD-10-CM | POA: Diagnosis not present

## 2019-08-17 DIAGNOSIS — N2581 Secondary hyperparathyroidism of renal origin: Secondary | ICD-10-CM | POA: Diagnosis not present

## 2019-08-18 ENCOUNTER — Inpatient Hospital Stay (HOSPITAL_COMMUNITY): Payer: Medicare Other

## 2019-08-18 ENCOUNTER — Other Ambulatory Visit: Payer: Self-pay

## 2019-08-18 ENCOUNTER — Inpatient Hospital Stay (HOSPITAL_COMMUNITY)
Admission: AD | Admit: 2019-08-18 | Discharge: 2019-08-31 | DRG: 617 | Disposition: A | Discharge status: Skilled Nursing Facility | Payer: Medicare Other | Source: Other Acute Inpatient Hospital | Attending: Internal Medicine | Admitting: Internal Medicine

## 2019-08-18 ENCOUNTER — Ambulatory Visit: Payer: Medicare Other

## 2019-08-18 ENCOUNTER — Encounter (HOSPITAL_COMMUNITY): Payer: Self-pay | Admitting: Internal Medicine

## 2019-08-18 DIAGNOSIS — M86172 Other acute osteomyelitis, left ankle and foot: Secondary | ICD-10-CM

## 2019-08-18 DIAGNOSIS — I70202 Unspecified atherosclerosis of native arteries of extremities, left leg: Secondary | ICD-10-CM | POA: Diagnosis not present

## 2019-08-18 DIAGNOSIS — N186 End stage renal disease: Secondary | ICD-10-CM | POA: Diagnosis present

## 2019-08-18 DIAGNOSIS — Z992 Dependence on renal dialysis: Secondary | ICD-10-CM

## 2019-08-18 DIAGNOSIS — L89322 Pressure ulcer of left buttock, stage 2: Secondary | ICD-10-CM | POA: Diagnosis present

## 2019-08-18 DIAGNOSIS — I272 Pulmonary hypertension, unspecified: Secondary | ICD-10-CM | POA: Diagnosis present

## 2019-08-18 DIAGNOSIS — R410 Disorientation, unspecified: Secondary | ICD-10-CM | POA: Diagnosis not present

## 2019-08-18 DIAGNOSIS — M6281 Muscle weakness (generalized): Secondary | ICD-10-CM | POA: Diagnosis not present

## 2019-08-18 DIAGNOSIS — M2550 Pain in unspecified joint: Secondary | ICD-10-CM | POA: Diagnosis not present

## 2019-08-18 DIAGNOSIS — E11622 Type 2 diabetes mellitus with other skin ulcer: Secondary | ICD-10-CM

## 2019-08-18 DIAGNOSIS — R1110 Vomiting, unspecified: Secondary | ICD-10-CM

## 2019-08-18 DIAGNOSIS — D62 Acute posthemorrhagic anemia: Secondary | ICD-10-CM | POA: Diagnosis not present

## 2019-08-18 DIAGNOSIS — Z9012 Acquired absence of left breast and nipple: Secondary | ICD-10-CM

## 2019-08-18 DIAGNOSIS — L97424 Non-pressure chronic ulcer of left heel and midfoot with necrosis of bone: Secondary | ICD-10-CM | POA: Diagnosis not present

## 2019-08-18 DIAGNOSIS — Z7689 Persons encountering health services in other specified circumstances: Secondary | ICD-10-CM | POA: Diagnosis not present

## 2019-08-18 DIAGNOSIS — E1140 Type 2 diabetes mellitus with diabetic neuropathy, unspecified: Secondary | ICD-10-CM | POA: Diagnosis present

## 2019-08-18 DIAGNOSIS — L89514 Pressure ulcer of right ankle, stage 4: Secondary | ICD-10-CM | POA: Diagnosis not present

## 2019-08-18 DIAGNOSIS — N2581 Secondary hyperparathyroidism of renal origin: Secondary | ICD-10-CM | POA: Diagnosis present

## 2019-08-18 DIAGNOSIS — E1122 Type 2 diabetes mellitus with diabetic chronic kidney disease: Secondary | ICD-10-CM | POA: Diagnosis present

## 2019-08-18 DIAGNOSIS — I5030 Unspecified diastolic (congestive) heart failure: Secondary | ICD-10-CM | POA: Diagnosis not present

## 2019-08-18 DIAGNOSIS — M86162 Other acute osteomyelitis, left tibia and fibula: Secondary | ICD-10-CM | POA: Diagnosis not present

## 2019-08-18 DIAGNOSIS — R5381 Other malaise: Secondary | ICD-10-CM | POA: Diagnosis not present

## 2019-08-18 DIAGNOSIS — D631 Anemia in chronic kidney disease: Secondary | ICD-10-CM | POA: Diagnosis not present

## 2019-08-18 DIAGNOSIS — I2510 Atherosclerotic heart disease of native coronary artery without angina pectoris: Secondary | ICD-10-CM | POA: Diagnosis not present

## 2019-08-18 DIAGNOSIS — I480 Paroxysmal atrial fibrillation: Secondary | ICD-10-CM | POA: Diagnosis present

## 2019-08-18 DIAGNOSIS — G8929 Other chronic pain: Secondary | ICD-10-CM | POA: Diagnosis present

## 2019-08-18 DIAGNOSIS — Z888 Allergy status to other drugs, medicaments and biological substances status: Secondary | ICD-10-CM

## 2019-08-18 DIAGNOSIS — F05 Delirium due to known physiological condition: Secondary | ICD-10-CM | POA: Diagnosis not present

## 2019-08-18 DIAGNOSIS — E1152 Type 2 diabetes mellitus with diabetic peripheral angiopathy with gangrene: Secondary | ICD-10-CM | POA: Diagnosis present

## 2019-08-18 DIAGNOSIS — I132 Hypertensive heart and chronic kidney disease with heart failure and with stage 5 chronic kidney disease, or end stage renal disease: Secondary | ICD-10-CM | POA: Diagnosis not present

## 2019-08-18 DIAGNOSIS — Z8673 Personal history of transient ischemic attack (TIA), and cerebral infarction without residual deficits: Secondary | ICD-10-CM

## 2019-08-18 DIAGNOSIS — E11621 Type 2 diabetes mellitus with foot ulcer: Secondary | ICD-10-CM | POA: Diagnosis present

## 2019-08-18 DIAGNOSIS — Z4781 Encounter for orthopedic aftercare following surgical amputation: Secondary | ICD-10-CM | POA: Diagnosis not present

## 2019-08-18 DIAGNOSIS — E118 Type 2 diabetes mellitus with unspecified complications: Secondary | ICD-10-CM | POA: Diagnosis present

## 2019-08-18 DIAGNOSIS — E785 Hyperlipidemia, unspecified: Secondary | ICD-10-CM | POA: Diagnosis present

## 2019-08-18 DIAGNOSIS — L97428 Non-pressure chronic ulcer of left heel and midfoot with other specified severity: Secondary | ICD-10-CM | POA: Diagnosis not present

## 2019-08-18 DIAGNOSIS — I120 Hypertensive chronic kidney disease with stage 5 chronic kidney disease or end stage renal disease: Secondary | ICD-10-CM | POA: Diagnosis not present

## 2019-08-18 DIAGNOSIS — M86472 Chronic osteomyelitis with draining sinus, left ankle and foot: Secondary | ICD-10-CM | POA: Diagnosis not present

## 2019-08-18 DIAGNOSIS — M869 Osteomyelitis, unspecified: Secondary | ICD-10-CM

## 2019-08-18 DIAGNOSIS — Z79899 Other long term (current) drug therapy: Secondary | ICD-10-CM | POA: Diagnosis not present

## 2019-08-18 DIAGNOSIS — L97314 Non-pressure chronic ulcer of right ankle with necrosis of bone: Secondary | ICD-10-CM | POA: Diagnosis not present

## 2019-08-18 DIAGNOSIS — E1169 Type 2 diabetes mellitus with other specified complication: Secondary | ICD-10-CM | POA: Diagnosis not present

## 2019-08-18 DIAGNOSIS — G92 Toxic encephalopathy: Secondary | ICD-10-CM | POA: Diagnosis not present

## 2019-08-18 DIAGNOSIS — E8889 Other specified metabolic disorders: Secondary | ICD-10-CM | POA: Diagnosis present

## 2019-08-18 DIAGNOSIS — Z803 Family history of malignant neoplasm of breast: Secondary | ICD-10-CM

## 2019-08-18 DIAGNOSIS — Z87891 Personal history of nicotine dependence: Secondary | ICD-10-CM

## 2019-08-18 DIAGNOSIS — I739 Peripheral vascular disease, unspecified: Secondary | ICD-10-CM

## 2019-08-18 DIAGNOSIS — Z853 Personal history of malignant neoplasm of breast: Secondary | ICD-10-CM | POA: Diagnosis not present

## 2019-08-18 DIAGNOSIS — Z7901 Long term (current) use of anticoagulants: Secondary | ICD-10-CM | POA: Diagnosis not present

## 2019-08-18 DIAGNOSIS — I447 Left bundle-branch block, unspecified: Secondary | ICD-10-CM | POA: Diagnosis not present

## 2019-08-18 DIAGNOSIS — I10 Essential (primary) hypertension: Secondary | ICD-10-CM | POA: Diagnosis not present

## 2019-08-18 DIAGNOSIS — R262 Difficulty in walking, not elsewhere classified: Secondary | ICD-10-CM | POA: Diagnosis present

## 2019-08-18 DIAGNOSIS — Z90710 Acquired absence of both cervix and uterus: Secondary | ICD-10-CM

## 2019-08-18 DIAGNOSIS — Z794 Long term (current) use of insulin: Secondary | ICD-10-CM | POA: Diagnosis not present

## 2019-08-18 DIAGNOSIS — L97429 Non-pressure chronic ulcer of left heel and midfoot with unspecified severity: Secondary | ICD-10-CM | POA: Diagnosis not present

## 2019-08-18 DIAGNOSIS — R2689 Other abnormalities of gait and mobility: Secondary | ICD-10-CM | POA: Diagnosis not present

## 2019-08-18 DIAGNOSIS — Z20822 Contact with and (suspected) exposure to covid-19: Secondary | ICD-10-CM | POA: Diagnosis not present

## 2019-08-18 DIAGNOSIS — I4891 Unspecified atrial fibrillation: Secondary | ICD-10-CM | POA: Diagnosis not present

## 2019-08-18 DIAGNOSIS — M19072 Primary osteoarthritis, left ankle and foot: Secondary | ICD-10-CM | POA: Diagnosis not present

## 2019-08-18 DIAGNOSIS — Z83438 Family history of other disorder of lipoprotein metabolism and other lipidemia: Secondary | ICD-10-CM

## 2019-08-18 DIAGNOSIS — I209 Angina pectoris, unspecified: Secondary | ICD-10-CM | POA: Diagnosis not present

## 2019-08-18 DIAGNOSIS — I959 Hypotension, unspecified: Secondary | ICD-10-CM | POA: Diagnosis not present

## 2019-08-18 DIAGNOSIS — E669 Obesity, unspecified: Secondary | ICD-10-CM | POA: Diagnosis not present

## 2019-08-18 DIAGNOSIS — F0390 Unspecified dementia without behavioral disturbance: Secondary | ICD-10-CM | POA: Diagnosis present

## 2019-08-18 DIAGNOSIS — E11610 Type 2 diabetes mellitus with diabetic neuropathic arthropathy: Secondary | ICD-10-CM | POA: Diagnosis present

## 2019-08-18 DIAGNOSIS — I509 Heart failure, unspecified: Secondary | ICD-10-CM | POA: Diagnosis not present

## 2019-08-18 DIAGNOSIS — R0989 Other specified symptoms and signs involving the circulatory and respiratory systems: Secondary | ICD-10-CM

## 2019-08-18 DIAGNOSIS — L89323 Pressure ulcer of left buttock, stage 3: Secondary | ICD-10-CM | POA: Diagnosis not present

## 2019-08-18 DIAGNOSIS — M7989 Other specified soft tissue disorders: Secondary | ICD-10-CM | POA: Diagnosis not present

## 2019-08-18 DIAGNOSIS — K5900 Constipation, unspecified: Secondary | ICD-10-CM | POA: Diagnosis not present

## 2019-08-18 DIAGNOSIS — I69328 Other speech and language deficits following cerebral infarction: Secondary | ICD-10-CM | POA: Diagnosis not present

## 2019-08-18 DIAGNOSIS — I6930 Unspecified sequelae of cerebral infarction: Secondary | ICD-10-CM | POA: Diagnosis not present

## 2019-08-18 DIAGNOSIS — L97419 Non-pressure chronic ulcer of right heel and midfoot with unspecified severity: Secondary | ICD-10-CM | POA: Diagnosis not present

## 2019-08-18 DIAGNOSIS — L97509 Non-pressure chronic ulcer of other part of unspecified foot with unspecified severity: Secondary | ICD-10-CM

## 2019-08-18 DIAGNOSIS — Z8249 Family history of ischemic heart disease and other diseases of the circulatory system: Secondary | ICD-10-CM

## 2019-08-18 DIAGNOSIS — Z6831 Body mass index (BMI) 31.0-31.9, adult: Secondary | ICD-10-CM

## 2019-08-18 DIAGNOSIS — I96 Gangrene, not elsewhere classified: Secondary | ICD-10-CM | POA: Diagnosis not present

## 2019-08-18 DIAGNOSIS — L97418 Non-pressure chronic ulcer of right heel and midfoot with other specified severity: Secondary | ICD-10-CM | POA: Diagnosis not present

## 2019-08-18 DIAGNOSIS — M25572 Pain in left ankle and joints of left foot: Secondary | ICD-10-CM | POA: Diagnosis not present

## 2019-08-18 DIAGNOSIS — Z7401 Bed confinement status: Secondary | ICD-10-CM | POA: Diagnosis not present

## 2019-08-18 DIAGNOSIS — E079 Disorder of thyroid, unspecified: Secondary | ICD-10-CM | POA: Diagnosis present

## 2019-08-18 DIAGNOSIS — M86171 Other acute osteomyelitis, right ankle and foot: Secondary | ICD-10-CM | POA: Diagnosis not present

## 2019-08-18 DIAGNOSIS — I517 Cardiomegaly: Secondary | ICD-10-CM | POA: Diagnosis not present

## 2019-08-18 DIAGNOSIS — M19071 Primary osteoarthritis, right ankle and foot: Secondary | ICD-10-CM | POA: Diagnosis not present

## 2019-08-18 LAB — GLUCOSE, CAPILLARY: Glucose-Capillary: 128 mg/dL — ABNORMAL HIGH (ref 70–99)

## 2019-08-18 LAB — C-REACTIVE PROTEIN: CRP: 0.7 mg/dL (ref <1.0)

## 2019-08-18 LAB — HEMOGLOBIN A1C
Hgb A1c MFr Bld: 6.2 % — ABNORMAL HIGH (ref 4.8–5.6)
Mean Plasma Glucose: 131.24 mg/dL

## 2019-08-18 LAB — SEDIMENTATION RATE: Sed Rate: 53 mm/hr — ABNORMAL HIGH (ref 0–22)

## 2019-08-18 MED ORDER — SODIUM CHLORIDE 0.9 % IV SOLN
2.0000 g | Freq: Once | INTRAVENOUS | Status: AC
  Administered 2019-08-18: 2 g via INTRAVENOUS
  Filled 2019-08-18: qty 2

## 2019-08-18 MED ORDER — ACETAMINOPHEN 650 MG RE SUPP: 650.0000 mg | Freq: Four times a day (QID) | RECTAL | Status: DC | PRN

## 2019-08-18 MED ORDER — PROCHLORPERAZINE EDISYLATE 10 MG/2ML IJ SOLN
5.0000 mg | Freq: Four times a day (QID) | INTRAMUSCULAR | Status: DC | PRN
  Administered 2019-08-18: 5 mg via INTRAVENOUS
  Filled 2019-08-18: qty 2
  Filled 2019-08-18: qty 1

## 2019-08-18 MED ORDER — VANCOMYCIN VARIABLE DOSE PER UNSTABLE RENAL FUNCTION (PHARMACIST DOSING): Status: DC

## 2019-08-18 MED ORDER — VANCOMYCIN HCL 500 MG/100ML IV SOLN
500.0000 mg | Freq: Once | INTRAVENOUS | Status: AC
  Administered 2019-08-18: 500 mg via INTRAVENOUS
  Filled 2019-08-18: qty 100

## 2019-08-18 MED ORDER — ACETAMINOPHEN 325 MG PO TABS
650.0000 mg | ORAL_TABLET | Freq: Four times a day (QID) | ORAL | Status: DC | PRN
  Filled 2019-08-18: qty 2

## 2019-08-18 MED ORDER — SODIUM CHLORIDE 0.9 % IV SOLN
1.0000 g | INTRAVENOUS | Status: DC
  Administered 2019-08-19 – 2019-08-26 (×8): 1 g via INTRAVENOUS
  Filled 2019-08-18 (×11): qty 1

## 2019-08-18 MED ORDER — INSULIN ASPART 100 UNIT/ML ~~LOC~~ SOLN
0.0000 [IU] | Freq: Every day | SUBCUTANEOUS | Status: DC
  Administered 2019-08-26: 2 [IU] via SUBCUTANEOUS

## 2019-08-18 MED ORDER — INSULIN ASPART 100 UNIT/ML ~~LOC~~ SOLN
0.0000 [IU] | Freq: Three times a day (TID) | SUBCUTANEOUS | Status: DC
  Administered 2019-08-19 – 2019-08-21 (×4): 1 [IU] via SUBCUTANEOUS
  Administered 2019-08-23 – 2019-08-27 (×6): 2 [IU] via SUBCUTANEOUS
  Administered 2019-08-27 – 2019-08-28 (×4): 1 [IU] via SUBCUTANEOUS
  Administered 2019-08-29: 2 [IU] via SUBCUTANEOUS

## 2019-08-18 NOTE — Progress Notes (Signed)
Pharmacy Antibiotic Note  Alexis [name] is a 74 y.o. female transferred from [facility] ED for management of osteomyelitis. Pharmacy has been asked to dose vancomycin and cefepime. Pt has ESRD on HD TTS, unknown when last session was. Pt received 1500mg  vancomycin load prior to transport. Reported weight at [facility] was 97kg, will give additional 500mg  of vancomycin for adequate load.  Plan: -Vancomycin 500mg  IV x1 -Follow iHD plans for further vancomycin dosing -Cefepime 2g IV x1 then 1g IV QHS -Follow-up ortho plans, blood cultures, S/Sx infection     No data recorded.  No results for input(s): WBC, CREATININE, LATICACIDVEN, VANCOTROUGH, VANCOPEAK, VANCORANDOM, GENTTROUGH, GENTPEAK, GENTRANDOM, TOBRATROUGH, TOBRAPEAK, TOBRARND, AMIKACINPEAK, AMIKACINTROU, AMIKACIN in the last 168 hours.  CrCl cannot be calculated (Patient's most recent lab result is older than the maximum 21 days allowed.).    Allergies  Allergen Reactions  . Olmesartan Other (See Comments)    Hyperkalemia     Antimicrobials this admission: Vancomycin 4/4 >>  Cefepime 4/4 >>    Microbiology results: pending  Thank you for allowing pharmacy to be a part of this patient's care.  Arrie Senate, PharmD, BCPS Clinical Pharmacist [phone] Please check AMION for all [employer] numbers 08/18/2019

## 2019-08-18 NOTE — H&P (Signed)
History and Physical    Alexis [name]:[id number] DOB: 07-Feb-1946 DOA: 08/18/2019  PCP: System, Provider Not In Patient coming from: [facility]  Chief Complaint: Bilateral heel ulcers  HPI: Alexis [name] is a 74 y.o. female with medical history significant of A. fib on Eliquis, ESRD on HD TTS, CAD, pulmonary hypertension, LBBB, hypertension, hyperlipidemia, CHF, type 2 diabetes, diabetic neuropathy, CVA presented to [facility] ED with complaints of bilateral heel ulcers.  Diagnosed with osteomyelitis of the right fifth metatarsal head and transferred to [facility] for further management as she is a dialysis patient.  Patient reports 1 month history of bilateral heel ulcers and pain in her feet.  Denies fevers or chills.  States she vomited once after eating breakfast today but no further episodes of vomiting or nausea since then.  Denies abdominal pain or diarrhea.  States her last bowel movement was 2 days ago.  She is not sure if she gets bowel movements regularly or how often.  States her last dialysis session was yesterday.  Denies cough, shortness of breath, or chest pain.  ED Course:  Afebrile and hemodynamically stable CBC: Hemoglobin 9.3, hematocrit 29.5, WBC 7.5, platelet 916 Metabolic panel: Sodium 384, potassium 3.3, chloride 96, bicarb 26, BUN 31, creatinine 6.1, glucose 149 Lactate 1.5 SARS-CoV-2 test negative, respiratory viral panel negative Blood cultures pending  X-rays of bilateral feet were done and revealed no fracture or dislocation.  Patient is status post right fifth digit amputation with ill-defined lucency of the fifth metatarsal head on x-ray concerning for osteomyelitis.  X-ray also showing a 6 mm linear radiopaque foreign body in the medial soft tissues of the left foot underlying the first metatarsal head.  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  Diffuse soft tissue edema about the feet.  Patient was started  on vancomycin.  Review of Systems:  All systems reviewed and apart from history of presenting illness, are negative.  Past Medical History:  Diagnosis Date  . Arthritis   . Cancer [facility]) 2005    left breast  . CHF (congestive heart failure) ([place])   . Chronic back pain   . Chronic kidney disease 03/2014   dialysis t/th/sa  . Coronary artery disease   . Diabetes mellitus    Type 2  . Diabetic nephropathy ([place]) 01-08-13  . Dyslipidemia 01-08-13  . ESRD on hemodialysis ([place])    Tu, Th, Sat  . Headache   . Hyperlipidemia   . Hypertension   . LBBB (left bundle branch block)   . Proteinuria 01-08-13  . Pulmonary hypertension ([place])    PA peak pressure 73 mmHg 08/05/17 echo  . Thyroid disease 01-08-13   Hyper-parathyroidism-secondary  . Wears glasses     Past Surgical History:  Procedure Laterality Date  . A/V FISTULAGRAM N/A 08/25/2017   Procedure: A/V FISTULAGRAM - Left Arm;  Surgeon: Angelia Mould, MD;  Location: [place] CV LAB;  Service: Cardiovascular;  Laterality: N/A;  . A/V FISTULAGRAM N/A 02/09/2018   Procedure: A/V [id number];  Surgeon: Elam Dutch, MD;  Location: [place] [facility];  Service: Cardiovascular;  Laterality: N/A;  . A/V FISTULAGRAM Left 04/04/2018   Procedure: A/V FISTULAGRAM;  Surgeon: Marty Heck, MD;  Location: [place] [facility] LAB;  Service: Cardiovascular;  Laterality: Left;  . A/V FISTULAGRAM Right 07/22/2019   Procedure: A/V FISTULAGRAM - Right Arm;  Surgeon: Waynetta Sandy, MD;  Location: [place] CV LAB;  Service: Cardiovascular;  Laterality: Right;  . ABDOMINAL AORTAGRAM N/A 08/11/2014   Procedure: ABDOMINAL Maxcine Ham;  Surgeon: Angelia Mould, MD;  Location: [facility] CATH LAB;  Service: Cardiovascular;  Laterality: N/A;  . ABDOMINAL HYSTERECTOMY    . APPENDECTOMY    . Arm surgery     Left arm trauma  . AV FISTULA PLACEMENT Left 08/21/2014   Procedure: LEFT ARM ARTERIOVENOUS (AV) FISTULA CREATION ;  Surgeon:  Angelia Mould, MD;  Location: [facility] OR;  Service: Vascular;  Laterality: Left;  . AV FISTULA PLACEMENT Right 06/13/2018   Procedure: insertion of right arm ARTERIOVENOUS (AV) gore-tex GRAFT;  Surgeon: Rosetta Posner, MD;  Location: [place];  Service: Vascular;  Laterality: Right;  . BACK SURGERY    . CATARACT EXTRACTION W/PHACO Left 12/22/2014   Procedure: CATARACT EXTRACTION PHACO AND INTRAOCULAR LENS PLACEMENT (IOC);  Surgeon: Tonny Branch, MD;  Location: [facility];  Service: Ophthalmology;  Laterality: Left;  CDE 13.48  . CORONARY ANGIOPLASTY  12/19/2003   inferior wall hypokinesis. ef 50%  . dialysis catheter    . INSERTION OF DIALYSIS CATHETER Right 06/13/2018   Procedure: INSERTION OF DIALYSIS CATHETER, right internal jugular;  Surgeon: Rosetta Posner, MD;  Location: [place];  Service: Vascular;  Laterality: Right;  . IR FLUORO GUIDE CV LINE RIGHT  04/06/2018  . IR REMOVAL TUN CV CATH W/O FL  04/25/2018  . IR REMOVAL TUN CV CATH W/O FL  07/20/2018  . IR US GUIDE VASC ACCESS RIGHT  04/06/2018  . LIGATION OF ARTERIOVENOUS  FISTULA Left 03/18/2019   Procedure: LIGATION OF ARTERIOVENOUS  FISTULA  LEFT ARM;  Surgeon: Angelia Mould, MD;  Location: [place];  Service: Vascular;  Laterality: Left;  Marland Kitchen MASTECTOMY     Left  . PERIPHERAL VASCULAR BALLOON ANGIOPLASTY Left 08/25/2017   Procedure: PERIPHERAL VASCULAR BALLOON ANGIOPLASTY;  Surgeon: Angelia Mould, MD;  Location: [place] [facility] LAB;  Service: Cardiovascular;  Laterality: Left;  arm fistula  . PERIPHERAL VASCULAR BALLOON ANGIOPLASTY Left 02/09/2018   Procedure: PERIPHERAL VASCULAR BALLOON ANGIOPLASTY;  Surgeon: Elam Dutch, MD;  Location: [place] [facility];  Service: Cardiovascular;  Laterality: Left;  AV Fistula  . PERIPHERAL VASCULAR BALLOON ANGIOPLASTY Left 04/04/2018   Procedure: PERIPHERAL VASCULAR BALLOON ANGIOPLASTY;  Surgeon: Marty Heck, MD;  Location: [place] [facility] LAB;  Service: Cardiovascular;  Laterality: Left;   UPPER ARM FISTULA  . PERIPHERAL VASCULAR CATHETERIZATION N/A 09/16/2015   Procedure: Fistulagram;  Surgeon: Rosetta Posner, MD;  Location: [place] [facility] LAB;  Service: Cardiovascular;  Laterality: N/A;  . SPINE SURGERY    . TONSILLECTOMY    . TRANSTHORACIC ECHOCARDIOGRAM  10/01/2010    Left ventricle: The cavity size was mildly dilated. Wall thickness was increased in a pattern of mild LVH. Systolic function was   mildly reduced. The estimated ejection fraction was in the range  of 45% to 50%.   . TUBAL LIGATION       reports that she quit smoking about 21 years ago. Her smoking use included cigarettes. She has a 50.00 pack-year smoking history. She has never used smokeless tobacco. She reports that she does not drink alcohol or use drugs.  Allergies  Allergen Reactions  . Olmesartan Other (See Comments)    Hyperkalemia     Family History  Problem Relation Age of Onset  . Breast cancer Mother        Died age 10  . Cancer Mother 10  Breast  . Heart disease Mother   . Hyperlipidemia Mother   . Hypertension Mother   . Heart attack Mother     Prior to Admission medications   Medication Sig Start Date End Date Taking? Authorizing Provider  albuterol (VENTOLIN HFA) 108 (90 Base) MCG/ACT inhaler Inhale 1-2 puffs into the lungs every 6 (six) hours as needed for wheezing or shortness of breath.    [provider]  amiodarone (PACERONE) 200 MG tablet Take 1 tablet (200 mg total) by mouth daily. Patient taking differently: Take 200 mg by mouth daily. (0900) 05/30/19   Strader, Fransisco Hertz, PA-C  apixaban (ELIQUIS) 2.5 MG TABS tablet Take 1 tablet (2.5 mg total) by mouth 2 (two) times daily. Patient taking differently: Take 2.5 mg by mouth 2 (two) times daily. 0900 & 2100 06/13/19 07/17/20  Manuella Ghazi, Pratik D, DO  atorvastatin (LIPITOR) 80 MG tablet Take 1 tablet (80 mg total) by mouth daily. Patient taking differently: Take 80 mg by mouth daily. (1700) 05/30/19   Strader, Fransisco Hertz,  PA-C  calcitRIOL (ROCALTROL) 0.25 MCG capsule Take 0.25 mcg by mouth Every Tuesday,Thursday,and Saturday with dialysis. (1700) 03/26/14   [provider]  diclofenac Sodium (VOLTAREN) 1 % GEL Apply 2 g topically 4 (four) times daily as needed for pain. 06/26/19   [provider]  docusate sodium (COLACE) 100 MG capsule Take 100 mg by mouth every other day. Hold for loose stools    [provider]  lidocaine-prilocaine (EMLA) cream Apply 1 application topically Every Tuesday,Thursday,and Saturday with dialysis. 1 hour prior to dialysis    [provider]  midodrine (PROAMATINE) 5 MG tablet Take 1 tablet (5 mg total) by mouth 3 (three) times daily with meals. 05/30/19   Strader, Fransisco Hertz, PA-C  nitroGLYCERIN (NITROSTAT) 0.4 MG SL tablet Place 0.4 mg under the tongue every 5 (five) minutes x 3 doses as needed for chest pain.     [provider]  NOVOLOG MIX 70/30 FLEXPEN (70-30) 100 UNIT/ML FlexPen Inject 25-30 Units into the skin See admin instructions. 30 units in the morning & 25 units at nights 06/05/19   [provider]  ondansetron (ZOFRAN-ODT) 4 MG disintegrating tablet Take 4 mg by mouth every 6 (six) hours as needed for nausea or vomiting.  05/18/19   [provider]  polyethylene glycol (MIRALAX / GLYCOLAX) packet Take 17 g by mouth daily as needed for mild constipation.     [provider]  sevelamer carbonate (RENVELA) 800 MG tablet Take 1,600 mg by mouth 3 (three) times daily with meals.    [provider]    Physical Exam: There were no vitals filed for this visit.  Physical Exam  Constitutional: She is oriented to person, place, and time. She appears well-developed and well-nourished. No distress.  HENT:  Head: Normocephalic.  Eyes: Right eye exhibits no discharge. Left eye exhibits no discharge.  Cardiovascular: Normal rate, regular rhythm and intact distal pulses.  Pulmonary/Chest: Effort normal. No  respiratory distress. She has no wheezes. She has rales.  Abdominal: Soft. Bowel sounds are normal. She exhibits no distension. There is no abdominal tenderness. There is no guarding.  Musculoskeletal:        General: No edema.     Cervical back: Neck supple.  Neurological: She is alert and oriented to person, place, and time.  Skin: Skin is warm and dry. She is not diaphoretic.  Left foot: Large ulcer noted at the heel Right foot: Ulcer noted at  the lateral aspect of the foot     Labs on Admission: I have personally reviewed following labs and imaging studies  CBC: No results for input(s): WBC, NEUTROABS, HGB, HCT, MCV, PLT in the last 168 hours. Basic Metabolic Panel: No results for input(s): NA, K, CL, CO2, GLUCOSE, BUN, CREATININE, CALCIUM, MG, PHOS in the last 168 hours. GFR: CrCl cannot be calculated (Patient's most recent lab result is older than the maximum 21 days allowed.). Liver Function Tests: No results for input(s): AST, ALT, ALKPHOS, BILITOT, PROT, ALBUMIN in the last 168 hours. No results for input(s): LIPASE, AMYLASE in the last 168 hours. No results for input(s): AMMONIA in the last 168 hours. Coagulation Profile: No results for input(s): INR, PROTIME in the last 168 hours. Cardiac Enzymes: No results for input(s): CKTOTAL, CKMB, CKMBINDEX, TROPONINI in the last 168 hours. BNP (last 3 results) No results for input(s): PROBNP in the last 8760 hours. HbA1C: No results for input(s): HGBA1C in the last 72 hours. CBG: No results for input(s): GLUCAP in the last 168 hours. Lipid Profile: No results for input(s): CHOL, HDL, LDLCALC, TRIG, CHOLHDL, LDLDIRECT in the last 72 hours. Thyroid Function Tests: No results for input(s): TSH, T4TOTAL, FREET4, T3FREE, THYROIDAB in the last 72 hours. Anemia Panel: No results for input(s): VITAMINB12, FOLATE, FERRITIN, TIBC, IRON, RETICCTPCT in the last 72 hours. Urine analysis:    Component Value Date/Time   COLORURINE YELLOW  08/19/2012 1126   APPEARANCEUR CLOUDY (A) 08/19/2012 1126   LABSPEC 1.018 08/19/2012 1126   PHURINE 5.0 08/19/2012 1126   GLUCOSEU NEGATIVE 08/19/2012 1126   HGBUR SMALL (A) 08/19/2012 1126   BILIRUBINUR NEGATIVE 08/19/2012 [place] 08/19/2012 1126   PROTEINUR >300 (A) 08/19/2012 1126   UROBILINOGEN 0.2 08/19/2012 1126   NITRITE NEGATIVE 08/19/2012 1126   LEUKOCYTESUR SMALL (A) 08/19/2012 1126    Radiological Exams on Admission: No results found.  Assessment/Plan Principal Problem:   Osteomyelitis (HCC) Active Problems:   Anemia in chronic kidney disease (CKD)   Type II diabetes mellitus with manifestations (HCC)   Diabetic foot ulcers (HCC)   Emesis   Diabetic foot ulcers/concern for osteomyelitis of the right fifth metatarsal head: No fever, leukocytosis, or signs of sepsis. X-rays of bilateral feet were done and revealed no fracture or dislocation.  Patient is status post right fifth digit amputation with ill-defined lucency of the fifth metatarsal head on x-ray concerning for osteomyelitis.  X-ray also showing a 6 mm linear radiopaque foreign body in the medial soft tissues of the left foot underlying the first metatarsal head.  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  Diffuse soft tissue edema about the feet. -Plan: Continue antibiotic coverage with vancomycin and cefepime.  Keep n.p.o. after midnight.  Consult orthopedics in a.m. Check ESR and CRP levels.  Order blood cultures.  Emesis: ?Constipation as her last bowel movement was 2 days ago.  Abdominal exam benign.  She is no longer nauseous and is requesting food to eat.  Will order abdominal x-ray for further evaluation.  Compazine as needed for nausea/vomiting.  Atrial fibrillation: Hold Eliquis at this time.  Will likely need surgery given osteomyelitis, pending orthopedic evaluation in a.m.  ESRD on HD: Patient's last HD session was yesterday 4/3.  Rales  appreciated on auscultation but no increased work of breathing or hypoxia.  Potassium mildly low at 3.3.  Bicarb 26.  Will order chest x-ray.  Consult nephrology in a.m.  Insulin-dependent type 2  diabetes: Check A1c.  Sliding scale insulin sensitive ACHS and CBG checks.  Anemia: Likely due to chronic renal disease.  Hemoglobin 9.3 on labs done at [facility], previously ranging from 9-11 on labs done 2 months ago.  Patient is not endorsing any symptoms of GI bleed.  Order anemia panel.  Pharmacy med rec pending.  DVT prophylaxis: SCDs at this time Code Status: Patient wishes to be full code. Family Communication: No family available at this time. Disposition Plan: Anticipate discharge after clinical improvement.  Will likely need surgery given osteomyelitis, pending orthopedic evaluation. Consults called: None Admission status: It is my clinical opinion that admission to INPATIENT is reasonable and necessary because of the expectation that this patient will require hospital care that crosses at least 2 midnights to treat this condition based on the medical complexity of the problems presented.  Given the aforementioned information, the predictability of an adverse outcome is felt to be significant.  The medical decision making on this patient was of high complexity and the patient is at high risk for clinical deterioration, therefore this is a level 3 visit.  Shela Leff MD [facility]  If 7PM-7AM, please contact night-coverage [email or web address]  08/18/2019, 8:14 PM

## 2019-08-19 ENCOUNTER — Inpatient Hospital Stay (HOSPITAL_COMMUNITY): Payer: Medicare Other

## 2019-08-19 DIAGNOSIS — E11621 Type 2 diabetes mellitus with foot ulcer: Secondary | ICD-10-CM | POA: Diagnosis not present

## 2019-08-19 DIAGNOSIS — E11622 Type 2 diabetes mellitus with other skin ulcer: Secondary | ICD-10-CM | POA: Diagnosis not present

## 2019-08-19 DIAGNOSIS — L97314 Non-pressure chronic ulcer of right ankle with necrosis of bone: Secondary | ICD-10-CM

## 2019-08-19 DIAGNOSIS — I739 Peripheral vascular disease, unspecified: Secondary | ICD-10-CM | POA: Diagnosis not present

## 2019-08-19 DIAGNOSIS — L97424 Non-pressure chronic ulcer of left heel and midfoot with necrosis of bone: Secondary | ICD-10-CM

## 2019-08-19 DIAGNOSIS — M869 Osteomyelitis, unspecified: Secondary | ICD-10-CM

## 2019-08-19 LAB — BASIC METABOLIC PANEL
Anion gap: 15 (ref 5–15)
BUN: 38 mg/dL — ABNORMAL HIGH (ref 8–23)
CO2: 27 mmol/L (ref 22–32)
Calcium: 8 mg/dL — ABNORMAL LOW (ref 8.9–10.3)
Chloride: 97 mmol/L — ABNORMAL LOW (ref 98–111)
Creatinine, Ser: 7.53 mg/dL — ABNORMAL HIGH (ref 0.44–1.00)
GFR calc Af Amer: 6 mL/min — ABNORMAL LOW (ref >60)
GFR calc non Af Amer: 5 mL/min — ABNORMAL LOW (ref >60)
Glucose, Bld: 113 mg/dL — ABNORMAL HIGH (ref 70–99)
Potassium: 3.5 mmol/L (ref 3.5–5.1)
Sodium: 139 mmol/L (ref 135–145)

## 2019-08-19 LAB — CBC
HCT: 27.9 % — ABNORMAL LOW (ref 36.0–46.0)
Hemoglobin: 8.8 g/dL — ABNORMAL LOW (ref 12.0–15.0)
MCH: 24.8 pg — ABNORMAL LOW (ref 26.0–34.0)
MCHC: 31.5 g/dL (ref 30.0–36.0)
MCV: 78.6 fL — ABNORMAL LOW (ref 80.0–100.0)
Platelets: 218 10*3/uL (ref 150–400)
RBC: 3.55 MIL/uL — ABNORMAL LOW (ref 3.87–5.11)
RDW: 19 % — ABNORMAL HIGH (ref 11.5–15.5)
WBC: 8 10*3/uL (ref 4.0–10.5)
nRBC: 0 % (ref 0.0–0.2)

## 2019-08-19 LAB — IRON AND TIBC
Iron: 33 ug/dL (ref 28–170)
Saturation Ratios: 14 % (ref 10.4–31.8)
TIBC: 230 ug/dL — ABNORMAL LOW (ref 250–450)
UIBC: 197 ug/dL

## 2019-08-19 LAB — RETICULOCYTES
Immature Retic Fract: 15.3 % (ref 2.3–15.9)
RBC.: 3.52 MIL/uL — ABNORMAL LOW (ref 3.87–5.11)
Retic Count, Absolute: 48.9 10*3/uL (ref 19.0–186.0)
Retic Ct Pct: 1.4 % (ref 0.4–3.1)

## 2019-08-19 LAB — FERRITIN: Ferritin: 382 ng/mL — ABNORMAL HIGH (ref 11–307)

## 2019-08-19 LAB — FOLATE: Folate: 8 ng/mL (ref >5.9)

## 2019-08-19 LAB — VITAMIN B12: Vitamin B-12: 308 pg/mL (ref 180–914)

## 2019-08-19 LAB — MRSA PCR SCREENING: MRSA by PCR: NEGATIVE

## 2019-08-19 LAB — GLUCOSE, CAPILLARY
Glucose-Capillary: 106 mg/dL — ABNORMAL HIGH (ref 70–99)
Glucose-Capillary: 103 mg/dL — ABNORMAL HIGH (ref 70–99)
Glucose-Capillary: 99 mg/dL (ref 70–99)
Glucose-Capillary: 133 mg/dL — ABNORMAL HIGH (ref 70–99)
Glucose-Capillary: 119 mg/dL — ABNORMAL HIGH (ref 70–99)

## 2019-08-19 MED ORDER — RENA-VITE PO TABS
1.0000 | ORAL_TABLET | Freq: Every day | ORAL | Status: DC
  Administered 2019-08-19 – 2019-08-30 (×10): 1 via ORAL
  Filled 2019-08-19 (×11): qty 1

## 2019-08-19 MED ORDER — DARBEPOETIN ALFA 60 MCG/0.3ML IJ SOSY
60.0000 ug | PREFILLED_SYRINGE | INTRAMUSCULAR | Status: DC
  Filled 2019-08-19 (×2): qty 0.3

## 2019-08-19 MED ORDER — HEPARIN SODIUM (PORCINE) 5000 UNIT/ML IJ SOLN
5000.0000 [IU] | Freq: Three times a day (TID) | INTRAMUSCULAR | Status: DC
  Administered 2019-08-19 – 2019-08-24 (×14): 5000 [IU] via SUBCUTANEOUS
  Filled 2019-08-19 (×13): qty 1

## 2019-08-19 MED ORDER — VANCOMYCIN HCL IN DEXTROSE 1-5 GM/200ML-% IV SOLN
1000.0000 mg | INTRAVENOUS | Status: DC
  Filled 2019-08-19 (×3): qty 200

## 2019-08-19 MED ORDER — FERRIC CITRATE 1 GM 210 MG(FE) PO TABS
420.0000 mg | ORAL_TABLET | Freq: Three times a day (TID) | ORAL | Status: DC
  Administered 2019-08-19 – 2019-08-31 (×21): 420 mg via ORAL
  Filled 2019-08-19 (×22): qty 2

## 2019-08-19 MED ORDER — NITROGLYCERIN 0.4 MG SL SUBL: 0.4000 mg | SUBLINGUAL_TABLET | SUBLINGUAL | Status: DC | PRN

## 2019-08-19 MED ORDER — COLLAGENASE 250 UNIT/GM EX OINT
TOPICAL_OINTMENT | Freq: Every day | CUTANEOUS | Status: DC
  Filled 2019-08-19: qty 30

## 2019-08-19 MED ORDER — CHLORHEXIDINE GLUCONATE CLOTH 2 % EX PADS: 6.0000 | MEDICATED_PAD | Freq: Every day | CUTANEOUS | Status: DC

## 2019-08-19 MED ORDER — AMIODARONE HCL 200 MG PO TABS
200.0000 mg | ORAL_TABLET | Freq: Every day | ORAL | Status: DC
  Administered 2019-08-19 – 2019-08-31 (×10): 200 mg via ORAL
  Filled 2019-08-19 (×11): qty 1

## 2019-08-19 MED ORDER — DOCUSATE SODIUM 100 MG PO CAPS
100.0000 mg | ORAL_CAPSULE | ORAL | Status: DC
  Administered 2019-08-19 – 2019-08-21 (×2): 100 mg via ORAL
  Filled 2019-08-19 (×3): qty 1

## 2019-08-19 MED ORDER — SEVELAMER CARBONATE 800 MG PO TABS
1600.0000 mg | ORAL_TABLET | Freq: Three times a day (TID) | ORAL | Status: DC
  Filled 2019-08-19: qty 2

## 2019-08-19 MED ORDER — FERRIC CITRATE 1 GM 210 MG(FE) PO TABS: 210.0000 mg | ORAL_TABLET | Freq: Every day | ORAL | Status: DC

## 2019-08-19 MED ORDER — ALBUTEROL SULFATE (2.5 MG/3ML) 0.083% IN NEBU: 3.0000 mL | INHALATION_SOLUTION | Freq: Four times a day (QID) | RESPIRATORY_TRACT | Status: DC | PRN

## 2019-08-19 MED ORDER — DOXERCALCIFEROL 4 MCG/2ML IV SOLN
3.0000 ug | INTRAVENOUS | Status: DC
  Administered 2019-08-29: 3 ug via INTRAVENOUS
  Filled 2019-08-19 (×6): qty 2

## 2019-08-19 MED ORDER — POLYETHYLENE GLYCOL 3350 17 G PO PACK
17.0000 g | PACK | Freq: Every day | ORAL | Status: DC | PRN
  Administered 2019-08-23: 17 g via ORAL
  Filled 2019-08-19: qty 1

## 2019-08-19 MED ORDER — ATORVASTATIN CALCIUM 80 MG PO TABS
80.0000 mg | ORAL_TABLET | Freq: Every day | ORAL | Status: DC
  Administered 2019-08-19 – 2019-08-31 (×10): 80 mg via ORAL
  Filled 2019-08-19 (×11): qty 1

## 2019-08-19 MED ORDER — DOXERCALCIFEROL 4 MCG/2ML IV SOLN: 2.0000 ug | INTRAVENOUS | Status: DC

## 2019-08-19 MED ORDER — MIDODRINE HCL 5 MG PO TABS
5.0000 mg | ORAL_TABLET | Freq: Three times a day (TID) | ORAL | Status: DC
  Administered 2019-08-19 – 2019-08-28 (×19): 5 mg via ORAL
  Filled 2019-08-19 (×20): qty 1

## 2019-08-19 MED ORDER — CALCITRIOL 0.25 MCG PO CAPS: 0.2500 ug | ORAL_CAPSULE | ORAL | Status: DC

## 2019-08-19 NOTE — Progress Notes (Signed)
Orthopedic Tech Progress Note Patient Details:  Alexis [name] [birth year] [id number]  Ortho Devices Type of Ortho Device: Prafo boot/shoe Ortho Device/Splint Location: BLE Ortho Device/Splint Interventions: Ordered, Application   Post Interventions Patient Tolerated: Well Instructions Provided: Care of device   Janit Pagan 08/19/2019, 6:09 PM

## 2019-08-19 NOTE — Consult Note (Signed)
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 female.  HPI: Alexis [name] came to the hospital with a month of foot ulcerations. She has had some intermittent pain but yesterday had some N/V. 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 She is s/p 5th toe amputation on that side that was done about a year ago at an outside institution.  Past Medical History:  Diagnosis Date  . Arthritis   . Cancer [facility]) 2005    left breast  . CHF (congestive heart failure) ([place])   . Chronic back pain   . Chronic kidney disease 03/2014   dialysis t/th/sa  . Coronary artery disease   . Diabetes mellitus    Type 2  . Diabetic nephropathy ([place]) 01-08-13  . Dyslipidemia 01-08-13  . ESRD on hemodialysis ([place])    Tu, Th, Sat  . Headache   . Hyperlipidemia   . Hypertension   . LBBB (left bundle branch block)   . Proteinuria 01-08-13  . Pulmonary hypertension ([place])    PA peak pressure 73 mmHg 08/05/17 echo  . Thyroid disease 01-08-13   Hyper-parathyroidism-secondary  . Wears glasses     Past Surgical History:  Procedure Laterality Date  . A/V FISTULAGRAM N/A 08/25/2017   Procedure: A/V FISTULAGRAM - Left Arm;  Surgeon: Angelia Mould, MD;  Location: [place] [facility];  Service: Cardiovascular;  Laterality: N/A;  . A/V FISTULAGRAM N/A 02/09/2018   Procedure: A/V [id number];  Surgeon: Elam Dutch, MD;  Location: [place] [facility];  Service: Cardiovascular;  Laterality: N/A;  . A/V FISTULAGRAM Left 04/04/2018   Procedure: A/V FISTULAGRAM;  Surgeon: Marty Heck, MD;  Location: [place] [facility];  Service: Cardiovascular;  Laterality: Left;  . A/V FISTULAGRAM Right 07/22/2019   Procedure: A/V FISTULAGRAM - Right Arm;  Surgeon: Waynetta Sandy, MD;  Location: [place] [facility];  Service: Cardiovascular;  Laterality: Right;  . ABDOMINAL AORTAGRAM N/A  08/11/2014   Procedure: ABDOMINAL Maxcine Ham;  Surgeon: Angelia Mould, MD;  Location: [facility] CATH LAB;  Service: Cardiovascular;  Laterality: N/A;  . ABDOMINAL HYSTERECTOMY    . APPENDECTOMY    . Arm surgery     Left arm trauma  . AV FISTULA PLACEMENT Left 08/21/2014   Procedure: LEFT ARM ARTERIOVENOUS (AV) FISTULA CREATION ;  Surgeon: Angelia Mould, MD;  Location: [facility] OR;  Service: Vascular;  Laterality: Left;  . AV FISTULA PLACEMENT Right 06/13/2018   Procedure: insertion of right arm ARTERIOVENOUS (AV) gore-tex GRAFT;  Surgeon: Rosetta Posner, MD;  Location: [place];  Service: Vascular;  Laterality: Right;  . BACK SURGERY    . CATARACT EXTRACTION W/PHACO Left 12/22/2014   Procedure: CATARACT EXTRACTION PHACO AND INTRAOCULAR LENS PLACEMENT (IOC);  Surgeon: Tonny Branch, MD;  Location: [facility];  Service: Ophthalmology;  Laterality: Left;  CDE 13.48  . CORONARY ANGIOPLASTY  12/19/2003   inferior wall hypokinesis. ef 50%  . dialysis catheter    . INSERTION OF DIALYSIS CATHETER Right 06/13/2018   Procedure: INSERTION OF DIALYSIS CATHETER, right internal jugular;  Surgeon: Rosetta Posner, MD;  Location: [place];  Service: Vascular;  Laterality: Right;  . IR FLUORO GUIDE CV LINE RIGHT  04/06/2018  . IR REMOVAL TUN CV CATH W/O FL  04/25/2018  . IR REMOVAL TUN CV CATH W/O FL  07/20/2018  . IR US GUIDE  VASC ACCESS RIGHT  04/06/2018  . LIGATION OF ARTERIOVENOUS  FISTULA Left 03/18/2019   Procedure: LIGATION OF ARTERIOVENOUS  FISTULA  LEFT ARM;  Surgeon: Angelia Mould, MD;  Location: [place];  Service: Vascular;  Laterality: Left;  Marland Kitchen MASTECTOMY     Left  . PERIPHERAL VASCULAR BALLOON ANGIOPLASTY Left 08/25/2017   Procedure: PERIPHERAL VASCULAR BALLOON ANGIOPLASTY;  Surgeon: Angelia Mould, MD;  Location: [place] [facility];  Service: Cardiovascular;  Laterality: Left;  arm fistula  . PERIPHERAL VASCULAR BALLOON ANGIOPLASTY Left 02/09/2018   Procedure: PERIPHERAL VASCULAR BALLOON ANGIOPLASTY;   Surgeon: Elam Dutch, MD;  Location: [place] [facility];  Service: Cardiovascular;  Laterality: Left;  AV Fistula  . PERIPHERAL VASCULAR BALLOON ANGIOPLASTY Left 04/04/2018   Procedure: PERIPHERAL VASCULAR BALLOON ANGIOPLASTY;  Surgeon: Marty Heck, MD;  Location: [place] [facility];  Service: Cardiovascular;  Laterality: Left;  UPPER ARM FISTULA  . PERIPHERAL VASCULAR CATHETERIZATION N/A 09/16/2015   Procedure: Fistulagram;  Surgeon: Rosetta Posner, MD;  Location: [place] [facility];  Service: Cardiovascular;  Laterality: N/A;  . SPINE SURGERY    . TONSILLECTOMY    . TRANSTHORACIC ECHOCARDIOGRAM  10/01/2010    Left ventricle: The cavity size was mildly dilated. Wall thickness was increased in a pattern of mild LVH. Systolic function was   mildly reduced. The estimated ejection fraction was in the range  of 45% to 50%.   . TUBAL LIGATION      Family History  Problem Relation Age of Onset  . Breast cancer Mother        Died age 8  . Cancer Mother 12       Breast  . Heart disease Mother   . Hyperlipidemia Mother   . Hypertension Mother   . Heart attack Mother     Social History:  reports that she quit smoking about 21 years ago. Her smoking use included cigarettes. She has a 50.00 pack-year smoking history. She has never used smokeless tobacco. She reports that she does not drink alcohol or use drugs.  Allergies:  Allergies  Allergen Reactions  . Olmesartan Other (See Comments)    Hyperkalemia     Medications: I have reviewed the patient's current medications.  Results for orders placed or performed during the hospital encounter of 08/18/19 (from the past 48 hour(s))  Hemoglobin A1c     Status: Abnormal   Collection Time: 08/18/19  8:09 PM  Result Value Ref Range   Hgb A1c MFr Bld 6.2 (H) 4.8 - 5.6 %    Comment: (NOTE) Pre diabetes:          5.7%-6.4% Diabetes:              >6.4% Glycemic control for   <7.0% adults with diabetes    Mean Plasma Glucose 131.24  mg/dL    Comment: Performed at [facility], [place] [address], [place], [place] [address]  Culture, blood (routine x 2)     Status: None (Preliminary result)   Collection Time: 08/18/19  8:17 PM   Specimen: BLOOD  Result Value Ref Range   Specimen Description BLOOD LEFT LAST DIGIT    Special Requests      BOTTLES DRAWN AEROBIC AND ANAEROBIC Blood Culture adequate volume   Culture PENDING    Report Status PENDING   Sedimentation rate     Status: Abnormal   Collection Time: 08/18/19  8:17 PM  Result Value Ref Range   Sed Rate  53 (H) 0 - 22 mm/hr    Comment: Performed at [facility], [place] [address] [place], [place] [address]  C-reactive protein     Status: None   Collection Time: 08/18/19  8:17 PM  Result Value Ref Range   CRP 0.7 <1.0 mg/dL    Comment: Performed at [facility], [place] [address], [place], [place] [address]  Glucose, capillary     Status: Abnormal   Collection Time: 08/18/19 10:42 PM  Result Value Ref Range   Glucose-Capillary 128 (H) 70 - 99 mg/dL    Comment: Glucose reference range applies only to samples taken after fasting for at least 8 hours.  Glucose, capillary     Status: Abnormal   Collection Time: 08/19/19  2:46 AM  Result Value Ref Range   Glucose-Capillary 106 (H) 70 - 99 mg/dL    Comment: Glucose reference range applies only to samples taken after fasting for at least 8 hours.  Basic metabolic panel     Status: Abnormal   Collection Time: 08/19/19  5:33 AM  Result Value Ref Range   Sodium 139 135 - 145 mmol/L   Potassium 3.5 3.5 - 5.1 mmol/L   Chloride 97 (L) 98 - 111 mmol/L   CO2 27 22 - 32 mmol/L   Glucose, Bld 113 (H) 70 - 99 mg/dL    Comment: Glucose reference range applies only to samples taken after fasting for at least 8 hours.   BUN 38 (H) 8 - 23 mg/dL   Creatinine, Ser 7.53 (H) 0.44 - 1.00 mg/dL   Calcium 8.0 (L) 8.9 - 10.3 mg/dL   GFR calc non Af Amer 5 (L) >60 mL/min   GFR calc Af Amer 6 (L) >60 mL/min   Anion gap 15 5  - 15    Comment: Performed at [place] [address] [place], [place] [address]  CBC     Status: Abnormal   Collection Time: 08/19/19  5:33 AM  Result Value Ref Range   WBC 8.0 4.0 - 10.5 K/uL   RBC 3.55 (L) 3.87 - 5.11 MIL/uL   Hemoglobin 8.8 (L) 12.0 - 15.0 g/dL   HCT 27.9 (L) 36.0 - 46.0 %   MCV 78.6 (L) 80.0 - 100.0 fL   MCH 24.8 (L) 26.0 - 34.0 pg   MCHC 31.5 30.0 - 36.0 g/dL   RDW 19.0 (H) 11.5 - 15.5 %   Platelets 218 150 - 400 K/uL   nRBC 0.0 0.0 - 0.2 %    Comment: Performed at [facility], [place] [address] [place], [place] [address]  Vitamin B12     Status: None   Collection Time: 08/19/19  5:33 AM  Result Value Ref Range   Vitamin B-12 308 180 - 914 pg/mL    Comment: (NOTE) This assay is not validated for testing neonatal or myeloproliferative syndrome specimens for Vitamin B12 levels. Performed at [facility], [place] [address], [place], [place] [address]   Folate     Status: None   Collection Time: 08/19/19  5:33 AM  Result Value Ref Range   Folate 8.0 >5.9 ng/mL    Comment: Performed at [place] [address], [place], [place] [address]  Iron and TIBC     Status: Abnormal   Collection Time: 08/19/19  5:33 AM  Result Value Ref Range   Iron 33 28 - 170 ug/dL   TIBC 230 (L) 250 - 450  ug/dL   Saturation Ratios 14 10.4 - 31.8 %   UIBC 197 ug/dL    Comment: Performed at [facility], [place] [address] [place], [place] [address]  Ferritin     Status: Abnormal   Collection Time: 08/19/19  5:33 AM  Result Value Ref Range   Ferritin 382 (H) 11 - 307 ng/mL    Comment: Performed at [facility], [place] [address], [place], [place] [address]  Reticulocytes     Status: Abnormal   Collection Time: 08/19/19  5:33 AM  Result Value Ref Range   Retic Ct Pct 1.4 0.4 - 3.1 %   RBC. 3.52 (L) 3.87 - 5.11 MIL/uL   Retic Count, Absolute 48.9 19.0 - 186.0 K/uL   Immature Retic Fract 15.3 2.3 - 15.9 %    Comment: Performed at [facility] [address] [place], [place] [address]  Glucose, capillary     Status: Abnormal   Collection Time: 08/19/19  6:55 AM  Result Value Ref Range   Glucose-Capillary 103 (H) 70 - 99 mg/dL    Comment: Glucose reference range applies only to samples taken after fasting for at least 8 hours.    DG Chest 1 View  Result Date: 08/18/2019 CLINICAL DATA:  [place]. EXAM: CHEST  1 VIEW COMPARISON:  May 29, 2019 FINDINGS: There is no evidence of acute infiltrate, pleural effusion or pneumothorax. There is stable marked severity enlargement of the cardiac silhouette. Multiple radiopaque surgical clips are seen along the left axilla. Degenerative changes seen throughout the thoracic spine. IMPRESSION: Stable cardiomegaly without evidence of acute or active cardiopulmonary disease. Electronically Signed   By: Virgina Norfolk M.D.   On: 08/18/2019 21:45   DG Abd 1 View  Result Date: 08/18/2019 CLINICAL DATA:  Vomiting, constipation EXAM: ABDOMEN - 1 VIEW COMPARISON:  05/12/2019 FINDINGS: There is a non obstructive bowel gas pattern. No supine evidence of free air. No organomegaly or suspicious calcification. No acute bony abnormality. Aortoiliac atherosclerosis. IMPRESSION: No acute findings. Electronically Signed   By: Rolm Baptise M.D.   On: 08/18/2019 21:40    Review of Systems  Constitutional: Negative for chills, diaphoresis and fever.  HENT: Negative for ear discharge, ear pain, hearing loss and tinnitus.   Eyes: Negative for photophobia and pain.  Respiratory: Negative for cough and shortness of breath.   Cardiovascular: Negative for chest pain.  Gastrointestinal: Positive for vomiting. Negative for abdominal pain and nausea.  Genitourinary: Negative for dysuria, flank pain, frequency and urgency.  Musculoskeletal: Negative for arthralgias, back pain, myalgias and neck pain.  Neurological: Negative for dizziness and headaches.  Hematological: Does not bruise/bleed easily.   Psychiatric/Behavioral: The patient is not nervous/anxious.    Blood pressure (!) 106/49, pulse 72, temperature 98.2 F (36.8 C), resp. rate 18, weight 94.6 kg, SpO2 95 %. Physical Exam  Constitutional: She appears well-developed and well-nourished. No distress.  HENT:  Head: Normocephalic and atraumatic.  Eyes: Conjunctivae are normal. Right eye exhibits no discharge. Left eye exhibits no discharge. No scleral icterus.  Cardiovascular: Normal rate and regular rhythm.  Respiratory: Effort normal. No respiratory distress.  Musculoskeletal:     Cervical back: Normal range of motion.     Comments: RLE No traumatic wounds, ecchymosis, or rash  Lateral ankle ulceration, gangrene noted 3rd toe with odor, ulceration dorsal aspect 2nd toe, 5th toe surgically absent  No knee or ankle effusion  Knee stable to varus/ valgus and anterior/posterior stress  Sens DPN,  SPN, TN absent  Motor EHL, ext, flex, evers 5/5  Pulses absent though possible intermittent faint DP, No significant edema  LLE No traumatic wounds, ecchymosis, or rash  Small heel ulceration  No knee or ankle effusion  Knee stable to varus/ valgus and anterior/posterior stress  Sens DPN, SPN, TN absent  Motor EHL, ext, flex, evers 5/5  Pulses absent though possible intermittent faint DP, No significant edema   Neurological: She is alert.  Skin: Skin is warm and dry. She is not diaphoretic.  Psychiatric: She has a normal mood and affect. Her behavior is normal.    Assessment/Plan: Bilateral foot ulcers -- Will get ABI's. If abnormal she should have vascular consult to see if she is a candidate for revascularization. Dr. Sharol Given to evaluate later today or in AM. Multiple medical problems including A. fib on Eliquis, ESRD on HD TTS, CAD, pulmonary hypertension, LBBB, hypertension, hyperlipidemia, CHF, type 2 diabetes, diabetic neuropathy, and CVA -- Please hold Eliquis until surgical plan is finalized. You may start heparin if you  feel it's warranted. Rest per primary service.    Lisette Abu, PA-C Orthopedic Surgery [phone] 08/19/2019, 8:48 AM

## 2019-08-19 NOTE — Progress Notes (Signed)
ANTICOAGULATION CONSULT NOTE - Initial Consult  Pharmacy Consult for heparin Indication: VTE prophylaxis  Allergies  Allergen Reactions  . Olmesartan Other (See Comments)    Hyperkalemia     Patient Measurements: Weight: 94.6 kg (208 lb 8.9 oz)   Vital Signs: Temp: 99 F (37.2 C) (04/05 0910) Temp Source: Oral (04/05 0910) BP: 105/48 (04/05 0910) Pulse Rate: 80 (04/05 0910)  Labs: Recent Labs    08/19/19 0533  HGB 8.8*  HCT 27.9*  PLT 218  CREATININE 7.53*    Estimated Creatinine Clearance: 7.7 mL/min (A) (by C-G formula based on SCr of 7.53 mg/dL (H)).   Medical History: Past Medical History:  Diagnosis Date  . Arthritis   . Cancer [facility]) 2005    left breast  . CHF (congestive heart failure) ([place])   . Chronic back pain   . Chronic kidney disease 03/2014   dialysis t/th/sa  . Coronary artery disease   . Diabetes mellitus    Type 2  . Diabetic nephropathy ([place]) 01-08-13  . Dyslipidemia 01-08-13  . ESRD on hemodialysis ([place])    Tu, Th, Sat  . Headache   . Hyperlipidemia   . Hypertension   . LBBB (left bundle branch block)   . Proteinuria 01-08-13  . Pulmonary hypertension ([place])    PA peak pressure 73 mmHg 08/05/17 echo  . Thyroid disease 01-08-13   Hyper-parathyroidism-secondary  . Wears glasses     Assessment: 74 yo female with h/o afib on apixaban, ESRD on HD TTS, CHF and h/o CVA. Last dose of apixaban 4/4 at 0900.   Goal of Therapy:  Prevention of VTE Monitor platelets by anticoagulation protocol: Yes   Plan:  Heparin 5000 units sq q8h F/u restart of apixaban  Lynnann Knudsen A. Levada Dy, PharmD, BCPS, FNKF Clinical Pharmacist [place] Please utilize Amion for appropriate phone number to reach the unit pharmacist ([place])   08/19/2019,11:23 AM

## 2019-08-19 NOTE — Consult Note (Addendum)
[facility] Renal Consultation Note    Indication for Consultation:  Management of ESRD/hemodialysis; anemia, hypertension/volume and secondary hyperparathyroidism PCP: No PCP on file  HPI: Alexis [name] is a 74 y.o. female with ESRD on hemodialysis T,Th,S at [facility]. PMH of morbid obesity, DMT2, HTN, HF, HLD, pulmonary HTN, Afib on Eliquis, CVA, SHPT, AOCD. She presented to [facility] from outside hospital for diabetic foot ulcers she says have been treated for over a month without improvement.  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 WBC 8.0 blood cultures pending. Other labs basically unremarkable for ESRD pt. HGB low 8.8, She was started on vancomycin and sent here for further evaluation. She is has been seen by orthopedics who are checking ABIs. We have been asked to manage hemodialysis.  Patient seen in room sleeping. She C/O of pain in L foot/LLE. She has bilateral heels wounds now covered with foam dressings, foul odor from R middle toes. She says she has been fine otherwise, just got "tired of waiting for them to get better and decided I'd to hospital".   Past Medical History:  Diagnosis Date  . Arthritis   . Cancer [facility]) 2005    left breast  . CHF (congestive heart failure) ([place])   . Chronic back pain   . Chronic kidney disease 03/2014   dialysis t/th/sa  . Coronary artery disease   . Diabetes mellitus    Type 2  . Diabetic nephropathy ([place]) 01-08-13  . Dyslipidemia 01-08-13  . ESRD on hemodialysis ([place])    Tu, Th, Sat  . Headache   . Hyperlipidemia   . Hypertension   . LBBB (left bundle branch block)   . Proteinuria 01-08-13  . Pulmonary hypertension ([place])    PA peak pressure 73 mmHg 08/05/17 echo  . Thyroid disease 01-08-13   Hyper-parathyroidism-secondary  . Wears glasses    Past Surgical History:  Procedure Laterality Date  . A/V FISTULAGRAM N/A 08/25/2017   Procedure: A/V FISTULAGRAM - Left Arm;   Surgeon: Angelia Mould, MD;  Location: [place] [facility];  Service: Cardiovascular;  Laterality: N/A;  . A/V FISTULAGRAM N/A 02/09/2018   Procedure: A/V [id number];  Surgeon: Elam Dutch, MD;  Location: [place] [facility];  Service: Cardiovascular;  Laterality: N/A;  . A/V FISTULAGRAM Left 04/04/2018   Procedure: A/V FISTULAGRAM;  Surgeon: Marty Heck, MD;  Location: [place] [facility];  Service: Cardiovascular;  Laterality: Left;  . A/V FISTULAGRAM Right 07/22/2019   Procedure: A/V FISTULAGRAM - Right Arm;  Surgeon: Waynetta Sandy, MD;  Location: [place] [facility];  Service: Cardiovascular;  Laterality: Right;  . ABDOMINAL AORTAGRAM N/A 08/11/2014   Procedure: ABDOMINAL Maxcine Ham;  Surgeon: Angelia Mould, MD;  Location: [facility] CATH LAB;  Service: Cardiovascular;  Laterality: N/A;  . ABDOMINAL HYSTERECTOMY    . APPENDECTOMY    . Arm surgery     Left arm trauma  . AV FISTULA PLACEMENT Left 08/21/2014   Procedure: LEFT ARM ARTERIOVENOUS (AV) FISTULA CREATION ;  Surgeon: Angelia Mould, MD;  Location: [facility] OR;  Service: Vascular;  Laterality: Left;  . AV FISTULA PLACEMENT Right 06/13/2018   Procedure: insertion of right arm ARTERIOVENOUS (AV) gore-tex GRAFT;  Surgeon: Rosetta Posner, MD;  Location: [place];  Service: Vascular;  Laterality: Right;  . BACK SURGERY    . CATARACT EXTRACTION W/PHACO Left 12/22/2014   Procedure: CATARACT EXTRACTION PHACO AND INTRAOCULAR LENS PLACEMENT (IOC);  Surgeon: Tonny Branch, MD;  Location: [facility];  Service: Ophthalmology;  Laterality: Left;  CDE 13.48  . CORONARY ANGIOPLASTY  12/19/2003   inferior wall hypokinesis. ef 50%  . dialysis catheter    . INSERTION OF DIALYSIS CATHETER Right 06/13/2018   Procedure: INSERTION OF DIALYSIS CATHETER, right internal jugular;  Surgeon: Rosetta Posner, MD;  Location: [place];  Service: Vascular;  Laterality: Right;  . IR FLUORO GUIDE CV LINE RIGHT  04/06/2018  . IR REMOVAL TUN CV CATH W/O FL   04/25/2018  . IR REMOVAL TUN CV CATH W/O FL  07/20/2018  . IR US GUIDE VASC ACCESS RIGHT  04/06/2018  . LIGATION OF ARTERIOVENOUS  FISTULA Left 03/18/2019   Procedure: LIGATION OF ARTERIOVENOUS  FISTULA  LEFT ARM;  Surgeon: Angelia Mould, MD;  Location: [place];  Service: Vascular;  Laterality: Left;  Marland Kitchen MASTECTOMY     Left  . PERIPHERAL VASCULAR BALLOON ANGIOPLASTY Left 08/25/2017   Procedure: PERIPHERAL VASCULAR BALLOON ANGIOPLASTY;  Surgeon: Angelia Mould, MD;  Location: [place] [facility];  Service: Cardiovascular;  Laterality: Left;  arm fistula  . PERIPHERAL VASCULAR BALLOON ANGIOPLASTY Left 02/09/2018   Procedure: PERIPHERAL VASCULAR BALLOON ANGIOPLASTY;  Surgeon: Elam Dutch, MD;  Location: [place] [facility];  Service: Cardiovascular;  Laterality: Left;  AV Fistula  . PERIPHERAL VASCULAR BALLOON ANGIOPLASTY Left 04/04/2018   Procedure: PERIPHERAL VASCULAR BALLOON ANGIOPLASTY;  Surgeon: Marty Heck, MD;  Location: [place] [facility];  Service: Cardiovascular;  Laterality: Left;  UPPER ARM FISTULA  . PERIPHERAL VASCULAR CATHETERIZATION N/A 09/16/2015   Procedure: Fistulagram;  Surgeon: Rosetta Posner, MD;  Location: [place] [facility];  Service: Cardiovascular;  Laterality: N/A;  . SPINE SURGERY    . TONSILLECTOMY    . TRANSTHORACIC ECHOCARDIOGRAM  10/01/2010    Left ventricle: The cavity size was mildly dilated. Wall thickness was increased in a pattern of mild LVH. Systolic function was   mildly reduced. The estimated ejection fraction was in the range  of 45% to 50%.   . TUBAL LIGATION     Family History  Problem Relation Age of Onset  . Breast cancer Mother        Died age 59  . Cancer Mother 67       Breast  . Heart disease Mother   . Hyperlipidemia Mother   . Hypertension Mother   . Heart attack Mother    Social History:  reports that she quit smoking about 21 years ago. Her smoking use included cigarettes. She has a 50.00 pack-year smoking history.  She has never used smokeless tobacco. She reports that she does not drink alcohol or use drugs. Allergies  Allergen Reactions  . Olmesartan Other (See Comments)    Hyperkalemia    Prior to Admission medications   Medication Sig Start Date End Date Taking? Authorizing Provider  albuterol (VENTOLIN HFA) 108 (90 Base) MCG/ACT inhaler Inhale 1-2 puffs into the lungs every 6 (six) hours as needed for wheezing or shortness of breath.   Yes [provider]  amiodarone (PACERONE) 200 MG tablet Take 1 tablet (200 mg total) by mouth daily. Patient taking differently: Take 200 mg by mouth daily. (0900) 05/30/19  Yes Strader, [place] M, PA-C  apixaban (ELIQUIS) 2.5 MG TABS tablet Take 1 tablet (2.5 mg total) by mouth 2 (two) times daily. Patient taking differently: Take 2.5 mg by mouth 2 (two) times daily. 0900 & 2100 06/13/19 07/17/20 Yes Manuella Ghazi, Pratik D,  DO  atorvastatin (LIPITOR) 80 MG tablet Take 1 tablet (80 mg total) by mouth daily. 05/30/19  Yes Strader, [place] M, PA-C  calcitRIOL (ROCALTROL) 0.25 MCG capsule Take 0.25 mcg by mouth Every Tuesday,Thursday,and Saturday with dialysis. (1700) 03/26/14  Yes [provider]  diclofenac Sodium (VOLTAREN) 1 % GEL Apply 2 g topically 4 (four) times daily as needed for pain. 06/26/19  Yes [provider]  docusate sodium (COLACE) 100 MG capsule Take 100 mg by mouth every other day. Hold for loose stools   Yes [provider]  lidocaine-prilocaine (EMLA) cream Apply 1 application topically Every Tuesday,Thursday,and Saturday with dialysis. 1 hour prior to dialysis   Yes [provider]  midodrine (PROAMATINE) 5 MG tablet Take 1 tablet (5 mg total) by mouth 3 (three) times daily with meals. 05/30/19  Yes Strader, [place] M, PA-C  nitroGLYCERIN (NITROSTAT) 0.4 MG SL tablet Place 0.4 mg under the tongue every 5 (five) minutes x 3 doses as needed for chest pain.    Yes [provider]  NOVOLOG MIX 70/30 FLEXPEN  (70-30) 100 UNIT/ML FlexPen Inject 25-30 Units into the skin See admin instructions. 30 units in the morning & 25 units at night 06/05/19  Yes [provider]  ondansetron (ZOFRAN-ODT) 4 MG disintegrating tablet Take 4 mg by mouth every 6 (six) hours as needed for nausea or vomiting.  05/18/19  Yes [provider]  polyethylene glycol (MIRALAX / GLYCOLAX) packet Take 17 g by mouth daily as needed for mild constipation.    Yes [provider]  sevelamer carbonate (RENVELA) 800 MG tablet Take 1,600 mg by mouth 3 (three) times daily with meals.   Yes [provider]   Current Facility-Administered Medications  Medication Dose Route Frequency Provider Last Rate Last Admin  . acetaminophen (TYLENOL) tablet 650 mg  650 mg Oral Q6H PRN Shela Leff, MD       Or  . acetaminophen (TYLENOL) suppository 650 mg  650 mg Rectal Q6H PRN Shela Leff, MD      . albuterol (PROVENTIL) (2.5 MG/3ML) 0.083% nebulizer solution 3 mL  3 mL Inhalation Q6H PRN Darliss Cheney, MD      . amiodarone (PACERONE) tablet 200 mg  200 mg Oral Daily Pahwani, Einar Grad, MD      . atorvastatin (LIPITOR) tablet 80 mg  80 mg Oral Daily Darliss Cheney, MD      . Derrill Memo ON 08/20/2019] calcitRIOL (ROCALTROL) capsule 0.25 mcg  0.25 mcg Oral Q T,Th,Sa-HD Pahwani, Ravi, MD      . ceFEPIme (MAXIPIME) 1 g in sodium chloride 0.9 % 100 mL IVPB  1 g Intravenous Q24H Einar Grad, RPH      . docusate sodium (COLACE) capsule 100 mg  100 mg Oral QODAY Pahwani, Ravi, MD      . heparin injection 5,000 Units  5,000 Units Subcutaneous Q8H Pierce, Dwayne A, RPH      . insulin aspart (novoLOG) injection 0-5 Units  0-5 Units Subcutaneous QHS Shela Leff, MD      . insulin aspart (novoLOG) injection 0-9 Units  0-9 Units Subcutaneous TID WC Shela Leff, MD      . midodrine (PROAMATINE) tablet 5 mg  5 mg Oral TID WC Pahwani, Ravi, MD      . nitroGLYCERIN (NITROSTAT) SL tablet 0.4 mg  0.4 mg Sublingual Q5  Min x 3 PRN Pahwani, Ravi, MD      . polyethylene glycol (MIRALAX / GLYCOLAX) packet 17 g  17 g  Oral Daily PRN Darliss Cheney, MD      . prochlorperazine (COMPAZINE) injection 5 mg  5 mg Intravenous Q6H PRN Shela Leff, MD   5 mg at 08/18/19 2104  . sevelamer carbonate (RENVELA) tablet 1,600 mg  1,600 mg Oral TID WC Pahwani, Ravi, MD      . vancomycin variable dose per unstable renal function (pharmacist dosing)   Does not apply See admin instructions Einar Grad [facility]       Labs: Basic Metabolic Panel: Recent Labs  Lab 08/19/19 0533  NA 139  K 3.5  CL 97*  CO2 27  GLUCOSE 113*  BUN 38*  CREATININE 7.53*  CALCIUM 8.0*   Liver Function Tests: No results for input(s): AST, ALT, ALKPHOS, BILITOT, PROT, ALBUMIN in the last 168 hours. No results for input(s): LIPASE, AMYLASE in the last 168 hours. No results for input(s): AMMONIA in the last 168 hours. CBC: Recent Labs  Lab 08/19/19 0533  WBC 8.0  HGB 8.8*  HCT 27.9*  MCV 78.6*  PLT 218   Cardiac Enzymes: No results for input(s): CKTOTAL, CKMB, CKMBINDEX, TROPONINI in the last 168 hours. CBG: Recent Labs  Lab 08/18/19 2242 08/19/19 0246 08/19/19 0655 08/19/19 1127  GLUCAP 128* 106* 103* 99   Iron Studies:  Recent Labs    08/19/19 0533  IRON 33  TIBC 230*  FERRITIN 382*   Studies/Results: DG Chest 1 View  Result Date: 08/18/2019 CLINICAL DATA:  [place]. EXAM: CHEST  1 VIEW COMPARISON:  May 29, 2019 FINDINGS: There is no evidence of acute infiltrate, pleural effusion or pneumothorax. There is stable marked severity enlargement of the cardiac silhouette. Multiple radiopaque surgical clips are seen along the left axilla. Degenerative changes seen throughout the thoracic spine. IMPRESSION: Stable cardiomegaly without evidence of acute or active cardiopulmonary disease. Electronically Signed   By: Virgina Norfolk M.D.   On: 08/18/2019 21:45   DG Abd 1 View  Result Date: 08/18/2019 CLINICAL DATA:   Vomiting, constipation EXAM: ABDOMEN - 1 VIEW COMPARISON:  05/12/2019 FINDINGS: There is a non obstructive bowel gas pattern. No supine evidence of free air. No organomegaly or suspicious calcification. No acute bony abnormality. Aortoiliac atherosclerosis. IMPRESSION: No acute findings. Electronically Signed   By: Rolm Baptise M.D.   On: 08/18/2019 21:40    ROS: As per HPI otherwise negative.   Physical Exam: Vitals:   08/18/19 2147 08/19/19 0246 08/19/19 0515 08/19/19 0910  BP: (!) 104/53 (!) 106/58 (!) 106/49 (!) 105/48  Pulse: 72 73 72 80  Resp: 17 18 18 20   Temp: 98.4 F (36.9 C) 98 F (36.7 C) 98.2 F (36.8 C) 99 F (37.2 C)  TempSrc:    Oral  SpO2: 92% 93% 95% 99%  Weight: 94.6 kg        General: Obese elderly female in no acute distress. Head: Normocephalic, atraumatic, sclera non-icteric, mucus membranes are moist Neck: Supple. JVD not elevated. Lungs: Bilateral breath sounds with few R bibasilar crackles. No WOB.  Heart: RRR with S1 S2. No murmurs, rubs, or gallops appreciated. Abdomen: Soft, non-tender, non-distended with normoactive bowel sounds. No rebound/guarding. No obvious abdominal masses. M-S:  Strength and tone appear normal for age. Lower extremities: skin dry, flaking on feet. band aide on heels. Eschar noted 2nd, 3rd and 4th R toes. 5th toe amp. Foul smell. L foot skin intact. Both feel demarcated.  Neuro: Alert and oriented X 3. Moves all extremities spontaneously. Psych:  Responds to questions appropriately with a normal  affect. Dialysis Access: R AVG + bruit  Dialysis Orders: [facility] T,Th,S 4.25 hr 400/600 96 kg 2.0 K/2.5 Ca R AVG -Heparin 2000 units IV initial bolus then 1000 units/hr. Stop 1 hour before end of treatment -Epogen 5400 units IV TIW -Hectorol 2.5 mcg IV TIW -Venofer 50 mg IV weekly  Assessment/Plan: 1.  Diabetic foot ulcers/possible osteo. On vanc, ortho consulted. Per primary 2.  ESRD -  T,Th,S. No acute HD needs today. HD  tomorrow on schedule.  3.  Hypertension/volume  -BP on soft side. On Midodrine for BP support. No evidence of volume excess. Is under OP EDW. Get standing wt with HD tomorrow. 1.5-2.5 liters with HD tomorrow.  4.  Anemia  -HGB 8.8 here. Epogen not on formulary. Give Aranesp 60 mcg IV with HD tomorrow.  5.  Metabolic bone disease - Calcitriol and hectorol both on OP med list. Ca 8.0. Unclear if this is an error or she actually takes this. Call HD unit to clarify. DC oral calcitriol. Continue Auryxia 210 mg PO TID AC 1 tab with snack.  6.  Nutrition - Renal/Carb mod diet.  7.  DM- per primary.   Denisse Whitenack H. Owens Shark, NP-C 08/19/2019, 2:12 PM  [employer] [phone]

## 2019-08-19 NOTE — Consult Note (Signed)
ORTHOPAEDIC CONSULTATION  REQUESTING PHYSICIAN: Darliss Cheney, MD  Chief Complaint: Pain bilateral feet.  HPI: Alexis [name] is a 74 y.o. female who presents with chronic ulceration left heel and right fibula.  Patient states she has been unable to walk due to her recent stroke.  She states she has been able to take a couple steps with therapy she states she is living at home.  Past Medical History:  Diagnosis Date  . Arthritis   . Cancer [facility]) 2005    left breast  . CHF (congestive heart failure) ([place])   . Chronic back pain   . Chronic kidney disease 03/2014   dialysis t/th/sa  . Coronary artery disease   . Diabetes mellitus    Type 2  . Diabetic nephropathy ([place]) 01-08-13  . Dyslipidemia 01-08-13  . ESRD on hemodialysis ([place])    Tu, Th, Sat  . Headache   . Hyperlipidemia   . Hypertension   . LBBB (left bundle branch block)   . Proteinuria 01-08-13  . Pulmonary hypertension ([place])    PA peak pressure 73 mmHg 08/05/17 echo  . Thyroid disease 01-08-13   Hyper-parathyroidism-secondary  . Wears glasses    Past Surgical History:  Procedure Laterality Date  . A/V FISTULAGRAM N/A 08/25/2017   Procedure: A/V FISTULAGRAM - Left Arm;  Surgeon: Angelia Mould, MD;  Location: [place] CV LAB;  Service: Cardiovascular;  Laterality: N/A;  . A/V FISTULAGRAM N/A 02/09/2018   Procedure: A/V ZOXWRUEAVWU;  Surgeon: Elam Dutch, MD;  Location: [place] CV LAB;  Service: Cardiovascular;  Laterality: N/A;  . A/V FISTULAGRAM Left 04/04/2018   Procedure: A/V FISTULAGRAM;  Surgeon: Marty Heck, MD;  Location: [place] CV LAB;  Service: Cardiovascular;  Laterality: Left;  . A/V FISTULAGRAM Right 07/22/2019   Procedure: A/V FISTULAGRAM - Right Arm;  Surgeon: Waynetta Sandy, MD;  Location: [place] CV LAB;  Service: Cardiovascular;  Laterality: Right;  . ABDOMINAL AORTAGRAM N/A 08/11/2014   Procedure: ABDOMINAL Maxcine Ham;  Surgeon: Angelia Mould,  MD;  Location: [facility] CATH LAB;  Service: Cardiovascular;  Laterality: N/A;  . ABDOMINAL HYSTERECTOMY    . APPENDECTOMY    . Arm surgery     Left arm trauma  . AV FISTULA PLACEMENT Left 08/21/2014   Procedure: LEFT ARM ARTERIOVENOUS (AV) FISTULA CREATION ;  Surgeon: Angelia Mould, MD;  Location: [facility] OR;  Service: Vascular;  Laterality: Left;  . AV FISTULA PLACEMENT Right 06/13/2018   Procedure: insertion of right arm ARTERIOVENOUS (AV) gore-tex GRAFT;  Surgeon: Rosetta Posner, MD;  Location: [place];  Service: Vascular;  Laterality: Right;  . BACK SURGERY    . CATARACT EXTRACTION W/PHACO Left 12/22/2014   Procedure: CATARACT EXTRACTION PHACO AND INTRAOCULAR LENS PLACEMENT (IOC);  Surgeon: Tonny Branch, MD;  Location: [facility];  Service: Ophthalmology;  Laterality: Left;  CDE 13.48  . CORONARY ANGIOPLASTY  12/19/2003   inferior wall hypokinesis. ef 50%  . dialysis catheter    . INSERTION OF DIALYSIS CATHETER Right 06/13/2018   Procedure: INSERTION OF DIALYSIS CATHETER, right internal jugular;  Surgeon: Rosetta Posner, MD;  Location: [place];  Service: Vascular;  Laterality: Right;  . IR FLUORO GUIDE CV LINE RIGHT  04/06/2018  . IR REMOVAL TUN CV CATH W/O FL  04/25/2018  . IR REMOVAL TUN CV CATH W/O FL  07/20/2018  . IR US GUIDE VASC ACCESS RIGHT  04/06/2018  . LIGATION OF ARTERIOVENOUS  FISTULA Left 03/18/2019  Procedure: LIGATION OF ARTERIOVENOUS  FISTULA  LEFT ARM;  Surgeon: Angelia Mould, MD;  Location: [place];  Service: Vascular;  Laterality: Left;  Marland Kitchen MASTECTOMY     Left  . PERIPHERAL VASCULAR BALLOON ANGIOPLASTY Left 08/25/2017   Procedure: PERIPHERAL VASCULAR BALLOON ANGIOPLASTY;  Surgeon: Angelia Mould, MD;  Location: [place] CV LAB;  Service: Cardiovascular;  Laterality: Left;  arm fistula  . PERIPHERAL VASCULAR BALLOON ANGIOPLASTY Left 02/09/2018   Procedure: PERIPHERAL VASCULAR BALLOON ANGIOPLASTY;  Surgeon: Elam Dutch, MD;  Location: [place] CV LAB;  Service:  Cardiovascular;  Laterality: Left;  AV Fistula  . PERIPHERAL VASCULAR BALLOON ANGIOPLASTY Left 04/04/2018   Procedure: PERIPHERAL VASCULAR BALLOON ANGIOPLASTY;  Surgeon: Marty Heck, MD;  Location: [place] CV LAB;  Service: Cardiovascular;  Laterality: Left;  UPPER ARM FISTULA  . PERIPHERAL VASCULAR CATHETERIZATION N/A 09/16/2015   Procedure: Fistulagram;  Surgeon: Rosetta Posner, MD;  Location: [place] CV LAB;  Service: Cardiovascular;  Laterality: N/A;  . SPINE SURGERY    . TONSILLECTOMY    . TRANSTHORACIC ECHOCARDIOGRAM  10/01/2010    Left ventricle: The cavity size was mildly dilated. Wall thickness was increased in a pattern of mild LVH. Systolic function was   mildly reduced. The estimated ejection fraction was in the range  of 45% to 50%.   . TUBAL LIGATION     Social History   Socioeconomic History  . Marital status: Legally Separated    Spouse name: Not on file  . Number of children: 3  . Years of education: Not on file  . Highest education level: Not on file  Occupational History  . Occupation: Retired  Tobacco Use  . Smoking status: Former Smoker    Packs/day: 1.00    Years: 50.00    Pack years: 50.00    Types: Cigarettes    Quit date: 05/16/1998    Years since quitting: 21.2  . Smokeless tobacco: Never Used  Substance and Sexual Activity  . Alcohol use: No    Alcohol/week: 0.0 standard drinks  . Drug use: No  . Sexual activity: Yes    Birth control/protection: Post-menopausal  Other Topics Concern  . Not on file  Social History Narrative   Lives at [place] alone (most of the time).  Three grandchildren and a one year old great grandchild   Social Determinants of Radio broadcast assistant Strain:   . Difficulty of Paying Living Expenses:   Food Insecurity:   . Worried About Charity fundraiser in the Last Year:   . Arboriculturist in the Last Year:   Transportation Needs:   . Film/video editor (Medical):   Marland Kitchen Lack of Transportation  (Non-Medical):   Physical Activity:   . Days of Exercise per Week:   . Minutes of Exercise per Session:   Stress:   . Feeling of Stress :   Social Connections:   . Frequency of Communication with Friends and Family:   . Frequency of Social Gatherings with Friends and Family:   . Attends Religious Services:   . Active Member of Clubs or Organizations:   . Attends Archivist Meetings:   Marland Kitchen Marital Status:    Family History  Problem Relation Age of Onset  . Breast cancer Mother        Died age 33  . Cancer Mother 22       Breast  . Heart disease Mother   . Hyperlipidemia Mother   .  Hypertension Mother   . Heart attack Mother    - negative except otherwise stated in the family history section Allergies  Allergen Reactions  . Olmesartan Other (See Comments)    Hyperkalemia    Prior to Admission medications   Medication Sig Start Date End Date Taking? Authorizing Provider  albuterol (VENTOLIN HFA) 108 (90 Base) MCG/ACT inhaler Inhale 1-2 puffs into the lungs every 6 (six) hours as needed for wheezing or shortness of breath.   Yes [provider]  amiodarone (PACERONE) 200 MG tablet Take 1 tablet (200 mg total) by mouth daily. Patient taking differently: Take 200 mg by mouth daily. (0900) 05/30/19  Yes Strader, [place] M, PA-C  apixaban (ELIQUIS) 2.5 MG TABS tablet Take 1 tablet (2.5 mg total) by mouth 2 (two) times daily. Patient taking differently: Take 2.5 mg by mouth 2 (two) times daily. 0900 & 2100 06/13/19 07/17/20 Yes Shah, Pratik D, DO  atorvastatin (LIPITOR) 80 MG tablet Take 1 tablet (80 mg total) by mouth daily. 05/30/19  Yes Strader, [place] M, PA-C  calcitRIOL (ROCALTROL) 0.25 MCG capsule Take 0.25 mcg by mouth Every Tuesday,Thursday,and Saturday with dialysis. (1700) 03/26/14  Yes [provider]  diclofenac Sodium (VOLTAREN) 1 % GEL Apply 2 g topically 4 (four) times daily as needed for pain. 06/26/19  Yes [provider]  docusate  sodium (COLACE) 100 MG capsule Take 100 mg by mouth every other day. Hold for loose stools   Yes [provider]  lidocaine-prilocaine (EMLA) cream Apply 1 application topically Every Tuesday,Thursday,and Saturday with dialysis. 1 hour prior to dialysis   Yes [provider]  midodrine (PROAMATINE) 5 MG tablet Take 1 tablet (5 mg total) by mouth 3 (three) times daily with meals. 05/30/19  Yes Strader, [place] M, PA-C  nitroGLYCERIN (NITROSTAT) 0.4 MG SL tablet Place 0.4 mg under the tongue every 5 (five) minutes x 3 doses as needed for chest pain.    Yes [provider]  NOVOLOG MIX 70/30 FLEXPEN (70-30) 100 UNIT/ML FlexPen Inject 25-30 Units into the skin See admin instructions. 30 units in the morning & 25 units at night 06/05/19  Yes [provider]  ondansetron (ZOFRAN-ODT) 4 MG disintegrating tablet Take 4 mg by mouth every 6 (six) hours as needed for nausea or vomiting.  05/18/19  Yes [provider]  polyethylene glycol (MIRALAX / GLYCOLAX) packet Take 17 g by mouth daily as needed for mild constipation.    Yes [provider]  sevelamer carbonate (RENVELA) 800 MG tablet Take 1,600 mg by mouth 3 (three) times daily with meals.   Yes [provider]   DG Chest 1 View  Result Date: 08/18/2019 CLINICAL DATA:  [place]. EXAM: CHEST  1 VIEW COMPARISON:  May 29, 2019 FINDINGS: There is no evidence of acute infiltrate, pleural effusion or pneumothorax. There is stable marked severity enlargement of the cardiac silhouette. Multiple radiopaque surgical clips are seen along the left axilla. Degenerative changes seen throughout the thoracic spine. IMPRESSION: Stable cardiomegaly without evidence of acute or active cardiopulmonary disease. Electronically Signed   By: Virgina Norfolk M.D.   On: 08/18/2019 21:45   DG Abd 1 View  Result Date: 08/18/2019 CLINICAL DATA:  Vomiting, constipation EXAM: ABDOMEN - 1 VIEW COMPARISON:  05/12/2019 FINDINGS:  There is a non obstructive bowel gas pattern. No supine evidence of free air. No organomegaly or suspicious calcification. No acute bony abnormality. Aortoiliac atherosclerosis. IMPRESSION: No acute findings. Electronically Signed   By: Rolm Baptise  M.D.   On: 08/18/2019 21:40   - pertinent xrays, CT, MRI studies were reviewed and independently interpreted  Positive ROS: All other systems have been reviewed and were otherwise negative with the exception of those mentioned in the HPI and as above.  Physical Exam: General: Alert, no acute distress Psychiatric: Patient is competent for consent with normal mood and affect Lymphatic: No axillary or cervical lymphadenopathy Cardiovascular: No pedal edema Respiratory: No cyanosis, no use of accessory musculature GI: No organomegaly, abdomen is soft and non-tender    Images:  @ENCIMAGES @  Labs:  Lab Results  Component Value Date   HGBA1C 6.2 (H) 08/18/2019   HGBA1C 6.4 (H) 05/09/2019   HGBA1C 6.7 (H) 03/25/2011   ESRSEDRATE 53 (H) 08/18/2019   CRP 0.7 08/18/2019   REPTSTATUS PENDING 08/18/2019   CULT  08/18/2019    NO GROWTH < 24 HOURS Performed at [facility], [place] [address] [place], [place] [address]    LABORGA ESCHERICHIA COLI 08/19/2012    Lab Results  Component Value Date   ALBUMIN 3.3 (L) 06/14/2019   ALBUMIN 3.1 (L) 06/13/2019   ALBUMIN 3.1 (L) 06/12/2019    Neurologic: Patient does not have protective sensation bilateral lower extremities.   MUSCULOSKELETAL:   Skin: Examination patient has thin atrophic skin in both feet.  I cannot palpate a pulse either dorsalis pedis or posterior tibial bilaterally.  By report patient has a radiograph of the right foot that shows possible osteomyelitis of the fifth metatarsal head.  Patient is status post fifth toe amputation about a year ago there is no ulcer no cellulitis no clinical signs of infection at the fifth metatarsal head.  Examination of the right ankle patient  has a ulcer over the fibula she lays with the right lower extremity externally rotated with all weight over the fibula.  There is no exposed bone or tendon but has an ischemic ulcer that is approximately 3 cm in diameter.  Examination of the left foot she has a large necrotic decubitus heel ulcer on the left which appears to extend down to bone with necrotic Achilles tendon.  This ulcer is approximately 4 cm in diameter.  There is foul-smelling odor from the wound.  New  Patient is undergone multiple interventions to reestablish her AV fistulas for dialysis.  Assessment: Assessment: Diabetic insensate neuropathy with end-stage renal disease on dialysis status post recent stroke with necrotic ulcer of the left heel and distal right fibula.  Plan: Plan: I will order x-rays of both ankles and both feet.  Ankle-brachial indices are pending.  I will place an order for a PRAFO boot to be worn for both lower extremities as well as Santyl dressing changes.  Patient may need vascular surgery evaluation for possible limb salvage.  Thank you for the consult and the opportunity to see Ms. Pranathi Winfree, [place] [phone] 5:00 PM

## 2019-08-19 NOTE — Consult Note (Signed)
[facility] Nurse has reviewed record and this patient has a positive xray or MRI for osteomyelitis (per orthopedic notes).  Not visible in the chart. This is considered outside of the scope of practice for the [facility] nurse, for that reason St[facility] Nurse will not consult.   Re-consult if only topical wound care needed after orthopedic or surgical evaluation Ashaya Raftery [facility] MSN, RN, [place], [place], [place]

## 2019-08-19 NOTE — Progress Notes (Signed)
PROGRESS NOTE    Alexis [name]  QMG:[id number] DOB: April 22, 1946 DOA: 08/18/2019 PCP: System, Provider Not In   Brief Narrative:  HPI: Alexis [name] is a 74 y.o. female with medical history significant of A. fib on Eliquis, ESRD on HD TTS, CAD, pulmonary hypertension, LBBB, hypertension, hyperlipidemia, CHF, type 2 diabetes, diabetic neuropathy, CVA presented to [facility] ED with complaints of bilateral heel ulcers.  Diagnosed with osteomyelitis of the right fifth metatarsal head and transferred to [facility] for further management as she is a dialysis patient.  Patient reports 1 month history of bilateral heel ulcers and pain in her feet.  Denies fevers or chills.  States she vomited once after eating breakfast today but no further episodes of vomiting or nausea since then.  Denies abdominal pain or diarrhea.  States her last bowel movement was 2 days ago.  She is not sure if she gets bowel movements regularly or how often.  States her last dialysis session was yesterday.  Denies cough, shortness of breath, or chest pain.  ED Course:  Afebrile and hemodynamically stable CBC: Hemoglobin 9.3, hematocrit 29.5, WBC 7.5, platelet 326 Metabolic panel: Sodium 712, potassium 3.3, chloride 96, bicarb 26, BUN 31, creatinine 6.1, glucose 149 Lactate 1.5 SARS-CoV-2 test negative, respiratory viral panel negative Blood cultures pending  X-rays of bilateral feet were done and revealed no fracture or dislocation.  Patient is status post right fifth digit amputation with ill-defined lucency of the fifth metatarsal head on x-ray concerning for osteomyelitis.  X-ray also showing a 6 mm linear radiopaque foreign body in the medial soft tissues of the left foot underlying the first metatarsal head.  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  Diffuse soft tissue edema about the feet.  Patient was started on vancomycin.  Assessment & Plan:   Principal  Problem:   Osteomyelitis ([place]) Active Problems:   Anemia in chronic kidney disease (CKD)   Type II diabetes mellitus with manifestations (HCC)   Diabetic foot ulcers (HCC)   Emesis   Diabetic foot ulcers/concern for osteomyelitis of the right second and third and fifth metatarsal head: No fever, leukocytosis, or signs of sepsis. X-rays of bilateral feet were done at OSH and revealed no fracture or dislocation.  Patient is status post right fifth digit amputation with ill-defined lucency of the fifth metatarsal head on x-ray concerning for osteomyelitis.  X-ray also showing a 6 mm linear radiopaque foreign body in the medial soft tissues of the left foot underlying the first metatarsal head.  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  Diffuse soft tissue edema about the feet.  Consulted orthopedic.  Continue current antibiotics in the meantime.  Continue to hold anticoagulation for anticipated surgical procedure.  Emesis: ?Constipation as her last bowel movement was 2 days ago.  Abdominal exam benign.  She is no longer nauseous and abdominal x-ray negative.  Atrial fibrillation: Hold Eliquis at this time.  Will likely need surgery given osteomyelitis, rates controlled.  Resume home dose of amiodarone.  Monitor on telemetry.  ESRD on HD: Patient's last HD session was yesterday 4/3.  Consulted nephrology.  Defer management to them.  Insulin-dependent type 2 diabetes: Takes 70/30 insulin at home.  He is only on SSI and blood sugar control.  Continue SSI for now.  Anemia of chronic disease.  Hemoglobin stable.  Acute encephalopathy vs baseline dementia: Patient slightly confused but easily redirectable.  She thought she was at home.  She thought it was March.  Do not know what patient's baseline is but she is pleasant.  Doubt encephalopathy.  Will need to check with family.  To do it later.  DVT prophylaxis: We will start on heparin DVT prophylaxis  dose   Code Status: Full Code  Family Communication:  None present at bedside.  Plan of care discussed with patient in length and he verbalized understanding and agreed with it. Patient is from: Home Disposition Plan: To be determined Barriers to discharge: Pending consultation and further surgical intervention for osteomyelitis   Estimated body mass index is 33.66 kg/m as calculated from the following:   Height as of 07/22/19: 5\' 6"  (1.676 m).   Weight as of this encounter: 94.6 kg.  Pressure Injury 08/18/19 Buttocks Left Stage 2 -  Partial thickness loss of dermis presenting as a shallow open injury with a red, pink wound bed without slough. (Active)  08/18/19 1930  Location: Buttocks  Location Orientation: Left  Staging: Stage 2 -  Partial thickness loss of dermis presenting as a shallow open injury with a red, pink wound bed without slough.  Wound Description (Comments):   Present on Admission: Yes     Nutritional status:               Consultants:   Orthopedics  Procedures:   None  Antimicrobials:  Anti-infectives (From admission, onward)   Start     Dose/Rate Route Frequency Ordered Stop   08/19/19 2000  ceFEPIme (MAXIPIME) 1 g in sodium chloride 0.9 % 100 mL IVPB     1 g 200 mL/hr over 30 Minutes Intravenous Every 24 hours 08/18/19 2000     08/18/19 2015  ceFEPIme (MAXIPIME) 2 g in sodium chloride 0.9 % 100 mL IVPB     2 g 200 mL/hr over 30 Minutes Intravenous  Once 08/18/19 2000 08/18/19 2139   08/18/19 2015  vancomycin (VANCOREADY) IVPB 500 mg/100 mL     500 mg 100 mL/hr over 60 Minutes Intravenous  Once 08/18/19 2000 08/18/19 2339   08/18/19 2000  vancomycin variable dose per unstable renal function (pharmacist dosing)      Does not apply See admin instructions 08/18/19 2000           Subjective: Seen and examined.  She has no complaints.  She is alert but partially oriented.  Easily redirectable.  Objective: Vitals:   08/18/19 2147 08/19/19  0246 08/19/19 0515  BP: (!) 104/53 (!) 106/58 (!) 106/49  Pulse: 72 73 72  Resp: 17 18 18   Temp: 98.4 F (36.9 C) 98 F (36.7 C) 98.2 F (36.8 C)  SpO2: 92% 93% 95%  Weight: 94.6 kg      Intake/Output Summary (Last 24 hours) at 08/19/2019 0829 Last data filed at 08/19/2019 0515 Gross per 24 hour  Intake 100 ml  Output 0 ml  Net 100 ml   Filed Weights   08/18/19 2147  Weight: 94.6 kg    Examination:  General exam: Appears calm and comfortable  Respiratory system: Clear to auscultation. Respiratory effort normal. Cardiovascular system: S1 & S2 heard, RRR. No JVD, murmurs, rubs, gallops or clicks. No pedal edema. Gastrointestinal system: Abdomen is nondistended, soft and nontender. No organomegaly or masses felt. Normal bowel sounds heard. Central nervous system: Alert and oriented x2. No focal neurological deficits. Extremities: Symmetric 5 x 5 power.  Necrotic head of the second and third right toe.  Missing right fifth toe.  Prior amputation. Skin: No rashes, lesions  or ulcers Psychiatry: Judgement and insight appear poor.  Mood & affect flat.  Data Reviewed: I have personally reviewed following labs and imaging studies  CBC: Recent Labs  Lab 08/19/19 0533  WBC 8.0  HGB 8.8*  HCT 27.9*  MCV 78.6*  PLT 878   Basic Metabolic Panel: Recent Labs  Lab 08/19/19 0533  NA 139  K 3.5  CL 97*  CO2 27  GLUCOSE 113*  BUN 38*  CREATININE 7.53*  CALCIUM 8.0*   GFR: Estimated Creatinine Clearance: 7.7 mL/min (A) (by C-G formula based on SCr of 7.53 mg/dL (H)). Liver Function Tests: No results for input(s): AST, ALT, ALKPHOS, BILITOT, PROT, ALBUMIN in the last 168 hours. No results for input(s): LIPASE, AMYLASE in the last 168 hours. No results for input(s): AMMONIA in the last 168 hours. Coagulation Profile: No results for input(s): INR, PROTIME in the last 168 hours. Cardiac Enzymes: No results for input(s): CKTOTAL, CKMB, CKMBINDEX, TROPONINI in the last 168  hours. BNP (last 3 results) No results for input(s): PROBNP in the last 8760 hours. HbA1C: Recent Labs    08/18/19 2009  HGBA1C 6.2*   CBG: Recent Labs  Lab 08/18/19 2242 08/19/19 0246 08/19/19 0655  GLUCAP 128* 106* 103*   Lipid Profile: No results for input(s): CHOL, HDL, LDLCALC, TRIG, CHOLHDL, LDLDIRECT in the last 72 hours. Thyroid Function Tests: No results for input(s): TSH, T4TOTAL, FREET4, T3FREE, THYROIDAB in the last 72 hours. Anemia Panel: Recent Labs    08/19/19 0533  VITAMINB12 308  FOLATE 8.0  FERRITIN 382*  TIBC 230*  IRON 33  RETICCTPCT 1.4   Sepsis Labs: No results for input(s): PROCALCITON, LATICACIDVEN in the last 168 hours.  Recent Results (from the past 240 hour(s))  Culture, blood (routine x 2)     Status: None (Preliminary result)   Collection Time: 08/18/19  8:17 PM   Specimen: BLOOD  Result Value Ref Range Status   Specimen Description BLOOD LEFT LAST DIGIT  Final   Special Requests   Final    BOTTLES DRAWN AEROBIC AND ANAEROBIC Blood Culture adequate volume   Culture PENDING  Incomplete   Report Status PENDING  Incomplete      Radiology Studies: DG Chest 1 View  Result Date: 08/18/2019 CLINICAL DATA:  [place]. EXAM: CHEST  1 VIEW COMPARISON:  May 29, 2019 FINDINGS: There is no evidence of acute infiltrate, pleural effusion or pneumothorax. There is stable marked severity enlargement of the cardiac silhouette. Multiple radiopaque surgical clips are seen along the left axilla. Degenerative changes seen throughout the thoracic spine. IMPRESSION: Stable cardiomegaly without evidence of acute or active cardiopulmonary disease. Electronically Signed   By: Virgina Norfolk M.D.   On: 08/18/2019 21:45   DG Abd 1 View  Result Date: 08/18/2019 CLINICAL DATA:  Vomiting, constipation EXAM: ABDOMEN - 1 VIEW COMPARISON:  05/12/2019 FINDINGS: There is a non obstructive bowel gas pattern. No supine evidence of free air. No organomegaly or suspicious  calcification. No acute bony abnormality. Aortoiliac atherosclerosis. IMPRESSION: No acute findings. Electronically Signed   By: Rolm Baptise M.D.   On: 08/18/2019 21:40    Scheduled Meds: . insulin aspart  0-5 Units Subcutaneous QHS  . insulin aspart  0-9 Units Subcutaneous TID WC  . vancomycin variable dose per unstable renal function (pharmacist dosing)   Does not apply See admin instructions   Continuous Infusions: . ceFEPime (MAXIPIME) IV       LOS: 1 day   Time spent: 35 minutes  Darliss Cheney, MD [facility]  08/19/2019, 8:29 AM   To contact the attending provider between 7A-7P or the covering provider during after hours 7P-7A, please log into the web site [email or web address].

## 2019-08-19 NOTE — Progress Notes (Signed)
New Admission Note: 08/18/2019  Arrival Method: From [facility] via [place] and oriented x 4 Assessment: completed Skin: 2 RNs checked. See skin assessment IV: RFA NSL Pain: No complaints of pain Safety Measures: Bed in low position, bed alarm on. Call light within reach.  Admission: Completed 5 [place] Orientation: Patient has been orientated to the room, unit and staff.    Orders have been reviewed and implemented. Will continue to monitor the patient.

## 2019-08-20 ENCOUNTER — Encounter (HOSPITAL_COMMUNITY): Payer: Medicare Other

## 2019-08-20 DIAGNOSIS — E11621 Type 2 diabetes mellitus with foot ulcer: Secondary | ICD-10-CM | POA: Diagnosis not present

## 2019-08-20 DIAGNOSIS — E11622 Type 2 diabetes mellitus with other skin ulcer: Secondary | ICD-10-CM | POA: Diagnosis not present

## 2019-08-20 DIAGNOSIS — I739 Peripheral vascular disease, unspecified: Secondary | ICD-10-CM | POA: Diagnosis not present

## 2019-08-20 DIAGNOSIS — M869 Osteomyelitis, unspecified: Secondary | ICD-10-CM | POA: Diagnosis not present

## 2019-08-20 LAB — CBC WITH DIFFERENTIAL/PLATELET
Abs Immature Granulocytes: 0.05 10*3/uL (ref 0.00–0.07)
Basophils Absolute: 0.1 10*3/uL (ref 0.0–0.1)
Basophils Relative: 1 %
Eosinophils Absolute: 0.4 10*3/uL (ref 0.0–0.5)
Eosinophils Relative: 4 %
HCT: 28.9 % — ABNORMAL LOW (ref 36.0–46.0)
Hemoglobin: 9.2 g/dL — ABNORMAL LOW (ref 12.0–15.0)
Immature Granulocytes: 1 %
Lymphocytes Relative: 14 %
Lymphs Abs: 1.3 10*3/uL (ref 0.7–4.0)
MCH: 24.9 pg — ABNORMAL LOW (ref 26.0–34.0)
MCHC: 31.8 g/dL (ref 30.0–36.0)
MCV: 78.1 fL — ABNORMAL LOW (ref 80.0–100.0)
Monocytes Absolute: 1.1 10*3/uL — ABNORMAL HIGH (ref 0.1–1.0)
Monocytes Relative: 13 %
Neutro Abs: 5.9 10*3/uL (ref 1.7–7.7)
Neutrophils Relative %: 67 %
Platelets: 227 10*3/uL (ref 150–400)
RBC: 3.7 MIL/uL — ABNORMAL LOW (ref 3.87–5.11)
RDW: 18.9 % — ABNORMAL HIGH (ref 11.5–15.5)
WBC: 8.7 10*3/uL (ref 4.0–10.5)
nRBC: 0 % (ref 0.0–0.2)

## 2019-08-20 LAB — COMPREHENSIVE METABOLIC PANEL
ALT: 10 U/L (ref 0–44)
AST: 12 U/L — ABNORMAL LOW (ref 15–41)
Albumin: 2.9 g/dL — ABNORMAL LOW (ref 3.5–5.0)
Alkaline Phosphatase: 48 U/L (ref 38–126)
Anion gap: 17 — ABNORMAL HIGH (ref 5–15)
BUN: 50 mg/dL — ABNORMAL HIGH (ref 8–23)
CO2: 24 mmol/L (ref 22–32)
Calcium: 8.3 mg/dL — ABNORMAL LOW (ref 8.9–10.3)
Chloride: 94 mmol/L — ABNORMAL LOW (ref 98–111)
Creatinine, Ser: 9.46 mg/dL — ABNORMAL HIGH (ref 0.44–1.00)
GFR calc Af Amer: 4 mL/min — ABNORMAL LOW (ref >60)
GFR calc non Af Amer: 4 mL/min — ABNORMAL LOW (ref >60)
Glucose, Bld: 114 mg/dL — ABNORMAL HIGH (ref 70–99)
Potassium: 3.7 mmol/L (ref 3.5–5.1)
Sodium: 135 mmol/L (ref 135–145)
Total Bilirubin: 0.8 mg/dL (ref 0.3–1.2)
Total Protein: 7.7 g/dL (ref 6.5–8.1)

## 2019-08-20 LAB — GLUCOSE, CAPILLARY
Glucose-Capillary: 112 mg/dL — ABNORMAL HIGH (ref 70–99)
Glucose-Capillary: 108 mg/dL — ABNORMAL HIGH (ref 70–99)
Glucose-Capillary: 95 mg/dL (ref 70–99)
Glucose-Capillary: 137 mg/dL — ABNORMAL HIGH (ref 70–99)
Glucose-Capillary: 143 mg/dL — ABNORMAL HIGH (ref 70–99)

## 2019-08-20 LAB — HEPATITIS B SURFACE ANTIGEN: Hepatitis B Surface Ag: NONREACTIVE

## 2019-08-20 LAB — HEPATITIS B SURFACE ANTIBODY,QUALITATIVE: Hep B S Ab: REACTIVE — AB

## 2019-08-20 LAB — HEPATITIS B CORE ANTIBODY, TOTAL: Hep B Core Total Ab: NONREACTIVE

## 2019-08-20 MED ORDER — VANCOMYCIN HCL IN DEXTROSE 1-5 GM/200ML-% IV SOLN
INTRAVENOUS | Status: AC
  Administered 2019-08-20: 1000 mg via INTRAVENOUS
  Filled 2019-08-20: qty 200

## 2019-08-20 MED ORDER — DARBEPOETIN ALFA 60 MCG/0.3ML IJ SOSY
PREFILLED_SYRINGE | INTRAMUSCULAR | Status: AC
  Administered 2019-08-20: 60 ug via INTRAVENOUS
  Filled 2019-08-20: qty 0.3

## 2019-08-20 MED ORDER — MIDODRINE HCL 5 MG PO TABS
ORAL_TABLET | ORAL | Status: AC
  Administered 2019-08-20: 5 mg via ORAL
  Filled 2019-08-20: qty 1

## 2019-08-20 MED ORDER — HEPARIN SODIUM (PORCINE) 1000 UNIT/ML IJ SOLN
INTRAMUSCULAR | Status: AC
  Administered 2019-08-20: 5000 [IU]
  Filled 2019-08-20: qty 5

## 2019-08-20 MED ORDER — DOXERCALCIFEROL 4 MCG/2ML IV SOLN
INTRAVENOUS | Status: AC
  Administered 2019-08-20: 3 ug via INTRAVENOUS
  Filled 2019-08-20: qty 2

## 2019-08-20 NOTE — Plan of Care (Signed)
  Problem: Activity: Goal: Risk for activity intolerance will decrease Outcome: Progressing   Problem: Nutrition: Goal: Adequate nutrition will be maintained Outcome: Progressing   

## 2019-08-20 NOTE — Progress Notes (Signed)
[facility] Progress Note   Subjective:  Seen in HD unit. UFG 3L. Tolerating UF. No complaints this am.   Objective Vitals:   08/20/19 0708 08/20/19 0730 08/20/19 0800 08/20/19 0830  BP: (!) 128/53 (!) 122/48 (!) 110/42 (!) 109/46  Pulse: 75 71 69 71  Resp:      Temp:      TempSrc:      SpO2:      Weight:         Additional Objective Labs: Basic Metabolic Panel: Recent Labs  Lab 08/19/19 0533 08/20/19 0420  NA 139 135  K 3.5 3.7  CL 97* 94*  CO2 27 24  GLUCOSE 113* 114*  BUN 38* 50*  CREATININE 7.53* 9.46*  CALCIUM 8.0* 8.3*   CBC: Recent Labs  Lab 08/19/19 0533 08/20/19 0420  WBC 8.0 8.7  NEUTROABS  --  5.9  HGB 8.8* 9.2*  HCT 27.9* 28.9*  MCV 78.6* 78.1*  PLT 218 227   Blood Culture    Component Value Date/Time   SDES BLOOD LEFT LAST DIGIT 08/18/2019 2017   SPECREQUEST  08/18/2019 2017    BOTTLES DRAWN AEROBIC AND ANAEROBIC Blood Culture adequate volume   CULT  08/18/2019 2017    NO GROWTH 2 DAYS Performed at [facility], [place] [address], [place], [place] [address]    REPTSTATUS PENDING 08/18/2019 2017     Physical Exam General: Alert, well appearing, nad  Heart: RRR  Lungs: clear bilaterally  Abdomen: soft non-tender Extremities: no sig LE edema; feet in booties  Dialysis Access: RUE AVG +bruit   Medications: . vancomycin    . ceFEPime (MAXIPIME) IV 1 g (08/19/19 2014)  . vancomycin     . amiodarone  200 mg Oral Daily  . atorvastatin  80 mg Oral Daily  . Chlorhexidine Gluconate Cloth  6 each Topical Q0600  . collagenase   Topical Daily  . darbepoetin (ARANESP) injection - DIALYSIS  60 mcg Intravenous Q Tue-HD  . docusate sodium  100 mg Oral QODAY  . doxercalciferol  3 mcg Intravenous Q T,Th,Sa-HD  . ferric citrate  420 mg Oral TID WC  . heparin injection (subcutaneous)  5,000 Units Subcutaneous Q8H  . insulin aspart  0-5 Units Subcutaneous QHS  . insulin aspart  0-9 Units Subcutaneous TID WC  . midodrine  5  mg Oral TID WC  . multivitamin  1 tablet Oral QHS    Dialysis Orders:  [facility] TTS 4.25 hr 400/600 96 kg 2.0 K/2.5 Ca R AVG -Heparin 2000 units IV initial bolus then 1000 units/hr. Stop 1 hour before end of treatment -Epogen 5400 units IV TIW -Hectorol 2.5 mcg IV TIW -Venofer 50 mg IV weekly  Assessment/Plan: 1.  PAD/Bilateral foot ulcers/osteomyelitis. BCx NGTD. On vanc, per primary Ortho following 2.  ESRD -  HD TTS.  HD today on schedule  3.  Hypertension/volume  -BP on soft side. On Midodrine for BP support. No evidence of volume excess. Is under OP EDW. Get standing wt with HD tomorrow. 1.5-2.5 liters with HD  4.  Anemia  -HGB 8.8>9.2 Epogen not on formulary.  Aranesp 60 mcg IV with HD 4/6.  5.  Metabolic bone disease - Ca 8.3 Calcitriol and hectorol both on OP med list. .  DC oral calcitriol. Continue Auryxia 210 mg PO TID AC 1 tab with snack.  6.  Nutrition - Renal/Carb mod diet.  7.  DM- per primary.   Lynnda Child PA-C [facility] Pager [phone] 08/20/2019,8:43 AM  LOS: 2 days

## 2019-08-20 NOTE — Progress Notes (Signed)
Patient ID: Alexis [name], female   DOB: 1945-09-26, 74 y.o.   MRN: [id number] Patient is seen in follow-up for ulcerations bilateral lower extremities with severe peripheral vascular disease.  Examination of the left calcaneus there are destructive bony changes on the x-ray which is consistent with osteomyelitis.  Examination of the right ankle x-rays shows destructive bony changes of the distal fibula also consistent with osteomyelitis.  There are chronic destructive changes of the fifth metatarsal head and the right foot which is also consistent with chronic osteomyelitis.  Ankle-brachial indices are pending today.  Will determine limb salvage options after vascular status established.

## 2019-08-20 NOTE — Progress Notes (Signed)
PROGRESS NOTE    Alexis [name]  Alexis [name] DOB: 1946/02/02 DOA: 08/18/2019 PCP: System, Provider Not In   Brief Narrative:  HPI: Alexis [name] is a 74 y.o. female with medical history significant of A. fib on Eliquis, ESRD on HD TTS, CAD, pulmonary hypertension, LBBB, hypertension, hyperlipidemia, CHF, type 2 diabetes, diabetic neuropathy, CVA presented to [facility] ED with complaints of bilateral heel ulcers.  Diagnosed with osteomyelitis of the right fifth metatarsal head and transferred to [facility] for further management as she is a dialysis patient.  Patient reports 1 month history of bilateral heel ulcers and pain in her feet.  Denies fevers or chills.  States she vomited once after eating breakfast today but no further episodes of vomiting or nausea since then.  Denies abdominal pain or diarrhea.  States her last bowel movement was 2 days ago.  She is not sure if she gets bowel movements regularly or how often.  States her last dialysis session was yesterday.  Denies cough, shortness of breath, or chest pain.  ED Course:  Afebrile and hemodynamically stable CBC: Hemoglobin 9.3, hematocrit 29.5, WBC 7.5, platelet 741 Metabolic panel: Sodium 638, potassium 3.3, chloride 96, bicarb 26, BUN 31, creatinine 6.1, glucose 149 Lactate 1.5 SARS-CoV-2 test negative, respiratory viral panel negative Blood cultures pending  X-rays of bilateral feet were done and revealed no fracture or dislocation.  Patient is status post right fifth digit amputation with ill-defined lucency of the fifth metatarsal head on x-ray concerning for osteomyelitis.  X-ray also showing a 6 mm linear radiopaque foreign body in the medial soft tissues of the left foot underlying the first metatarsal head.  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  Diffuse soft tissue edema about the feet.  Patient was started on vancomycin.  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 X-rays of bilateral feet were done at OSH and revealed no fracture or dislocation.  Patient is status post right fifth digit amputation with ill-defined lucency of the fifth metatarsal head on x-ray concerning for osteomyelitis.  X-ray also showing a 6 mm linear radiopaque foreign body in the medial soft tissues of the left foot underlying the first metatarsal head.  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  Diffuse soft tissue edema about the feet.  Consulted orthopedic.  They repeated x-rays.  Patient is due for ankle-brachial indices today.  Continue current antibiotics in the meantime.  Continue to hold anticoagulation for anticipated surgical procedure.  Further management per orthopedics.  Emesis: ?Constipation as her last bowel movement was 2 days ago.  Abdominal exam benign.  She is no longer nauseous and abdominal x-ray negative.  Atrial fibrillation: Hold Eliquis at this time.  Will likely need surgery given osteomyelitis, rates controlled.  Continue home dose of amiodarone.  Monitor on telemetry.  ESRD on HD: Patient's last HD session today.  Per nephrology.    Insulin-dependent type 2 diabetes: Takes 70/30 insulin at home.  He is only on SSI and blood sugar control.  Continue  SSI for now.  Anemia of chronic disease.  Hemoglobin stable.  Acute encephalopathy vs baseline dementia: Patient was slightly confused but easily redirectable yesterday.  She thought she was at home.  She thought it was March however today,  she is alert and oriented x3.   DVT prophylaxis:  heparin DVT prophylaxis dose   Code Status: Full Code  Family Communication:  None present at bedside.  Plan of care discussed with patient in length and he verbalized understanding and agreed with it. Patient is from: Home Disposition Plan: To be determined Barriers to discharge: Pending consultation and further surgical intervention for osteomyelitis by orthopedics   Estimated body mass index is 34.94 kg/m as calculated from the following:   Height as of 07/22/19: 5\' 6"  (1.676 m).   Weight as of this encounter: 98.2 kg.  Pressure Injury 08/18/19 Buttocks Left Stage 2 -  Partial thickness loss of dermis presenting as a shallow open injury with a red, pink wound bed without slough. (Active)  08/18/19 1930  Location: Buttocks  Location Orientation: Left  Staging: Stage 2 -  Partial thickness loss of dermis presenting as a shallow open injury with a red, pink wound bed without slough.  Wound Description (Comments):   Present on Admission: Yes     Nutritional status:               Consultants:   Orthopedics  Procedures:   None  Antimicrobials:  Anti-infectives (From admission, onward)   Start     Dose/Rate Route Frequency Ordered Stop   08/20/19 1200  vancomycin (VANCOCIN) IVPB 1000 mg/200 mL premix     1,000 mg 200 mL/hr over 60 Minutes Intravenous Every T-Th-Sa (Hemodialysis) 08/19/19 1501     08/19/19 2000  ceFEPIme (MAXIPIME) 1 g in sodium chloride 0.9 % 100 mL IVPB     1 g 200 mL/hr over 30 Minutes Intravenous Every 24 hours 08/18/19 2000     08/18/19 2015  ceFEPIme (MAXIPIME) 2 g in sodium chloride 0.9 % 100 mL IVPB     2 g 200 mL/hr over 30 Minutes Intravenous  Once 08/18/19 2000 08/18/19 2139   08/18/19 2015  vancomycin (VANCOREADY) IVPB 500 mg/100 mL     500 mg 100 mL/hr over 60 Minutes Intravenous  Once 08/18/19 2000 08/18/19 2339   08/18/19 2000  vancomycin variable dose per unstable renal function  (pharmacist dosing)  Status:  Discontinued      Does not apply See admin instructions 08/18/19 2000 08/19/19 1501         Subjective: Seen and examined after she returned from dialysis.  She has no complaints.  Objective: Vitals:   08/20/19 0930 08/20/19 1000 08/20/19 1030 08/20/19 1100  BP: (!) 124/51 (!) 107/42 (!) 113/44 (!) 129/55  Pulse: 72 67 72 71  Resp:      Temp:      TempSrc:      SpO2:      Weight:        Intake/Output Summary (Last 24 hours) at 08/20/2019 1342 Last data filed at 08/20/2019 0900 Gross per 24 hour  Intake 1000 ml  Output 0 ml  Net 1000 ml   Filed Weights   08/18/19 2147 08/19/19 2105 08/20/19 0654  Weight: 94.6 kg 97.3 kg 98.2 kg    Examination:  General exam: Appears calm and comfortable  Respiratory system: Clear to auscultation. Respiratory effort normal. Cardiovascular system: S1 & S2 heard, irregularly irregular rate and rhythm. No JVD, murmurs, rubs, gallops  or clicks. No pedal edema. Gastrointestinal system: Abdomen is nondistended, soft and nontender. No organomegaly or masses felt. Normal bowel sounds heard. Central nervous system: Alert and oriented. No focal neurological deficits. Extremities: Symmetric 5 x 5 power.  Necrotic head of the second and third right toe.  Missing right fifth toe.  Status post amputation in the past. Skin: No rashes, lesions or ulcers.  Psychiatry: Judgement and insight appear poor.  Mood & affect flat.  Data Reviewed: I have personally reviewed following labs and imaging studies  CBC: Recent Labs  Lab 08/19/19 0533 08/20/19 0420  WBC 8.0 8.7  NEUTROABS  --  5.9  HGB 8.8* 9.2*  HCT 27.9* 28.9*  MCV 78.6* 78.1*  PLT 218 563   Basic Metabolic Panel: Recent Labs  Lab 08/19/19 0533 08/20/19 0420  NA 139 135  K 3.5 3.7  CL 97* 94*  CO2 27 24  GLUCOSE 113* 114*  BUN 38* 50*  CREATININE 7.53* 9.46*  CALCIUM 8.0* 8.3*   GFR: Estimated Creatinine Clearance: 6.3 mL/min (A) (by C-G formula  based on SCr of 9.46 mg/dL (H)). Liver Function Tests: Recent Labs  Lab 08/20/19 0420  AST 12*  ALT 10  ALKPHOS 48  BILITOT 0.8  PROT 7.7  ALBUMIN 2.9*   No results for input(s): LIPASE, AMYLASE in the last 168 hours. No results for input(s): AMMONIA in the last 168 hours. Coagulation Profile: No results for input(s): INR, PROTIME in the last 168 hours. Cardiac Enzymes: No results for input(s): CKTOTAL, CKMB, CKMBINDEX, TROPONINI in the last 168 hours. BNP (last 3 results) No results for input(s): PROBNP in the last 8760 hours. HbA1C: Recent Labs    08/18/19 2009  HGBA1C 6.2*   CBG: Recent Labs  Lab 08/19/19 1649 08/19/19 2102 08/20/19 0626 08/20/19 0638 08/20/19 1155  GLUCAP 133* 119* 112* 108* 95   Lipid Profile: No results for input(s): CHOL, HDL, LDLCALC, TRIG, CHOLHDL, LDLDIRECT in the last 72 hours. Thyroid Function Tests: No results for input(s): TSH, T4TOTAL, FREET4, T3FREE, THYROIDAB in the last 72 hours. Anemia Panel: Recent Labs    08/19/19 0533  VITAMINB12 308  FOLATE 8.0  FERRITIN 382*  TIBC 230*  IRON 33  RETICCTPCT 1.4   Sepsis Labs: No results for input(s): PROCALCITON, LATICACIDVEN in the last 168 hours.  Recent Results (from the past 240 hour(s))  Culture, blood (routine x 2)     Status: None (Preliminary result)   Collection Time: 08/18/19  8:09 PM   Specimen: BLOOD LEFT HAND  Result Value Ref Range Status   Specimen Description BLOOD LEFT HAND  Final   Special Requests   Final    BOTTLES DRAWN AEROBIC AND ANAEROBIC Blood Culture adequate volume   Culture   Final    NO GROWTH 2 DAYS Performed at [facility], [address] [place], [place] [address]    Report Status PENDING  Incomplete  Culture, blood (routine x 2)     Status: None (Preliminary result)   Collection Time: 08/18/19  8:17 PM   Specimen: BLOOD  Result Value Ref Range Status   Specimen Description BLOOD LEFT LAST DIGIT  Final   Special Requests   Final     BOTTLES DRAWN AEROBIC AND ANAEROBIC Blood Culture adequate volume   Culture   Final    NO GROWTH 2 DAYS Performed at [facility], [place] [address], [place], [place] [address]    Report Status PENDING  Incomplete  MRSA PCR Screening  Status: None   Collection Time: 08/19/19  8:23 AM   Specimen: Nasal Mucosa; Nasopharyngeal  Result Value Ref Range Status   MRSA by PCR NEGATIVE NEGATIVE Final    Comment:        The GeneXpert MRSA Assay (FDA approved for NASAL specimens only), is one component of a comprehensive MRSA colonization surveillance program. It is not intended to diagnose MRSA infection nor to guide or monitor treatment for MRSA infections. Performed at [facility], [place] [address], [place], [place] [address]       Radiology Studies: DG Chest 1 View  Result Date: 08/18/2019 CLINICAL DATA:  [place]. EXAM: CHEST  1 VIEW COMPARISON:  May 29, 2019 FINDINGS: There is no evidence of acute infiltrate, pleural effusion or pneumothorax. There is stable marked severity enlargement of the cardiac silhouette. Multiple radiopaque surgical clips are seen along the left axilla. Degenerative changes seen throughout the thoracic spine. IMPRESSION: Stable cardiomegaly without evidence of acute or active cardiopulmonary disease. Electronically Signed   By: Virgina Norfolk M.D.   On: 08/18/2019 21:45   DG Ankle Complete Left  Result Date: 08/19/2019 CLINICAL DATA:  Pain and swelling. Osteomyelitis. EXAM: LEFT ANKLE COMPLETE - 3+ VIEW COMPARISON:  None. FINDINGS: There is no acute displaced fracture or dislocation. There is osteopenia which limits detection of nondisplaced fractures. There may be ulceration overlying the medial malleolus without definite evidence for underlying osteomyelitis. There is a moderate-sized Achilles tendon enthesophyte. There is a small plantar calcaneal spur. Advanced vascular calcifications are noted. IMPRESSION: 1. No acute displaced fracture or  dislocation. 2. Possible ulceration overlying the medial malleolus without definite evidence for underlying osteomyelitis. 3. Advanced vascular calcifications. Electronically Signed   By: Constance Holster M.D.   On: 08/19/2019 19:56   DG Ankle Complete Right  Result Date: 08/19/2019 CLINICAL DATA:  Osteomyelitis of the ankle and foot. EXAM: RIGHT ANKLE - COMPLETE 3+ VIEW COMPARISON:  February 01, 2013 FINDINGS: There are advanced degenerative changes of the ankle mortise with surrounding soft tissue swelling. There is no acute displaced fracture. No dislocation. Advanced degenerative changes are noted of the midfoot. There is a moderate-sized plantar calcaneal spur. There is an Achilles tendon enthesophyte. Vascular calcifications are noted. There is no radiographic evidence for osteomyelitis. IMPRESSION: 1. No acute osseous abnormality. 2. Advanced degenerative changes are noted of the midfoot and ankle mortise. Electronically Signed   By: Constance Holster M.D.   On: 08/19/2019 19:57   DG Abd 1 View  Result Date: 08/18/2019 CLINICAL DATA:  Vomiting, constipation EXAM: ABDOMEN - 1 VIEW COMPARISON:  05/12/2019 FINDINGS: There is a non obstructive bowel gas pattern. No supine evidence of free air. No organomegaly or suspicious calcification. No acute bony abnormality. Aortoiliac atherosclerosis. IMPRESSION: No acute findings. Electronically Signed   By: Rolm Baptise M.D.   On: 08/18/2019 21:40   DG Foot 2 Views Left  Result Date: 08/19/2019 CLINICAL DATA:  Osteomyelitis of the ankle or foot. EXAM: LEFT FOOT - 2 VIEW COMPARISON:  08/18/2019 FINDINGS: Again noted is a foreign body in the medial soft tissues of the left foot. There are degenerative changes of the interphalangeal joints. Advanced vascular calcifications are noted. There is a moderate-sized plantar calcaneal spur. There is no acute displaced fracture or dislocation. There is no definite radiographic evidence for osteomyelitis. IMPRESSION: 1.  No definite radiographic evidence for osteomyelitis. 2. Stable foreign body in the medial soft tissues of the left foot. 3. Degenerative changes of the interphalangeal joints. Electronically Signed  By: Constance Holster M.D.   On: 08/19/2019 19:55   DG Foot Complete Right  Result Date: 08/19/2019 CLINICAL DATA:  Osteomyelitis. 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 There is a lucency at the head of the fifth metatarsal. There is some surrounding soft tissue swelling. Vascular calcifications are noted. There are degenerative changes of the midfoot. IMPRESSION: 1. No acute displaced fracture or dislocation. 2. 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 There is a persistent ill-defined lucency at the head of the fifth metatarsal which can be seen in patients with osteomyelitis. Electronically Signed   By: Constance Holster M.D.   On: 08/19/2019 19:59    Scheduled Meds: . amiodarone  200 mg Oral Daily  . atorvastatin  80 mg Oral Daily  . Chlorhexidine Gluconate Cloth  6 each Topical Q0600  . collagenase   Topical Daily  . darbepoetin (ARANESP) injection - DIALYSIS  60 mcg Intravenous Q Tue-HD  . docusate sodium  100 mg Oral QODAY  . doxercalciferol  3 mcg Intravenous Q T,Th,Sa-HD  . ferric citrate  420 mg Oral TID WC  . heparin injection (subcutaneous)  5,000 Units Subcutaneous Q8H  . insulin aspart  0-5 Units Subcutaneous QHS  . insulin aspart  0-9 Units Subcutaneous TID WC  . midodrine  5 mg Oral TID WC  . multivitamin  1 tablet Oral QHS   Continuous Infusions: . ceFEPime (MAXIPIME) IV 1 g (08/19/19 2014)  . vancomycin Stopped (08/20/19 1049)     LOS: 2 days   Time spent: 30 minutes   Darliss Cheney, MD [facility]  08/20/2019, 1:42 PM   To contact the attending provider between 7A-7P or the covering provider during after hours 7P-7A, please  log into the web site [email or web address].

## 2019-08-21 ENCOUNTER — Inpatient Hospital Stay (HOSPITAL_COMMUNITY): Payer: Medicare Other

## 2019-08-21 DIAGNOSIS — L97314 Non-pressure chronic ulcer of right ankle with necrosis of bone: Secondary | ICD-10-CM | POA: Diagnosis not present

## 2019-08-21 DIAGNOSIS — E118 Type 2 diabetes mellitus with unspecified complications: Secondary | ICD-10-CM

## 2019-08-21 DIAGNOSIS — I739 Peripheral vascular disease, unspecified: Secondary | ICD-10-CM

## 2019-08-21 DIAGNOSIS — E11622 Type 2 diabetes mellitus with other skin ulcer: Secondary | ICD-10-CM | POA: Diagnosis not present

## 2019-08-21 DIAGNOSIS — I96 Gangrene, not elsewhere classified: Secondary | ICD-10-CM

## 2019-08-21 DIAGNOSIS — M869 Osteomyelitis, unspecified: Secondary | ICD-10-CM | POA: Diagnosis not present

## 2019-08-21 DIAGNOSIS — M86172 Other acute osteomyelitis, left ankle and foot: Secondary | ICD-10-CM

## 2019-08-21 LAB — GLUCOSE, CAPILLARY
Glucose-Capillary: 126 mg/dL — ABNORMAL HIGH (ref 70–99)
Glucose-Capillary: 124 mg/dL — ABNORMAL HIGH (ref 70–99)
Glucose-Capillary: 147 mg/dL — ABNORMAL HIGH (ref 70–99)
Glucose-Capillary: 153 mg/dL — ABNORMAL HIGH (ref 70–99)

## 2019-08-21 LAB — CBC WITH DIFFERENTIAL/PLATELET
Abs Immature Granulocytes: 0.03 10*3/uL (ref 0.00–0.07)
Basophils Absolute: 0.1 10*3/uL (ref 0.0–0.1)
Basophils Relative: 1 %
Eosinophils Absolute: 0.4 10*3/uL (ref 0.0–0.5)
Eosinophils Relative: 4 %
HCT: 30.1 % — ABNORMAL LOW (ref 36.0–46.0)
Hemoglobin: 9.6 g/dL — ABNORMAL LOW (ref 12.0–15.0)
Immature Granulocytes: 0 %
Lymphocytes Relative: 12 %
Lymphs Abs: 1 10*3/uL (ref 0.7–4.0)
MCH: 24.9 pg — ABNORMAL LOW (ref 26.0–34.0)
MCHC: 31.9 g/dL (ref 30.0–36.0)
MCV: 78.2 fL — ABNORMAL LOW (ref 80.0–100.0)
Monocytes Absolute: 1.1 10*3/uL — ABNORMAL HIGH (ref 0.1–1.0)
Monocytes Relative: 13 %
Neutro Abs: 5.8 10*3/uL (ref 1.7–7.7)
Neutrophils Relative %: 70 %
Platelets: 224 10*3/uL (ref 150–400)
RBC: 3.85 MIL/uL — ABNORMAL LOW (ref 3.87–5.11)
RDW: 18.9 % — ABNORMAL HIGH (ref 11.5–15.5)
WBC: 8.3 10*3/uL (ref 4.0–10.5)
nRBC: 0 % (ref 0.0–0.2)

## 2019-08-21 LAB — COMPREHENSIVE METABOLIC PANEL
ALT: 6 U/L (ref 0–44)
AST: 12 U/L — ABNORMAL LOW (ref 15–41)
Albumin: 2.8 g/dL — ABNORMAL LOW (ref 3.5–5.0)
Alkaline Phosphatase: 49 U/L (ref 38–126)
Anion gap: 15 (ref 5–15)
BUN: 28 mg/dL — ABNORMAL HIGH (ref 8–23)
CO2: 26 mmol/L (ref 22–32)
Calcium: 8.6 mg/dL — ABNORMAL LOW (ref 8.9–10.3)
Chloride: 95 mmol/L — ABNORMAL LOW (ref 98–111)
Creatinine, Ser: 6.8 mg/dL — ABNORMAL HIGH (ref 0.44–1.00)
GFR calc Af Amer: 6 mL/min — ABNORMAL LOW (ref >60)
GFR calc non Af Amer: 6 mL/min — ABNORMAL LOW (ref >60)
Glucose, Bld: 132 mg/dL — ABNORMAL HIGH (ref 70–99)
Potassium: 3.7 mmol/L (ref 3.5–5.1)
Sodium: 136 mmol/L (ref 135–145)
Total Bilirubin: 0.6 mg/dL (ref 0.3–1.2)
Total Protein: 7.8 g/dL (ref 6.5–8.1)

## 2019-08-21 NOTE — H&P (View-Only) (Signed)
Hospital Consult    Reason for Consult:  Non healing wounds BLE Requesting Physician:  Sharol Given MRN #:  [id number]  History of Present Illness: This is a 74 y.o. female who was admitted a couple of days ago with one month hx of bilateral heel ulcers and pain in her feet.  She had xrays that revealed osteomyelitis.  Dr. Sharol Given was consulted and upon his review of the foot xrays, her xray on the left calcaneous reveals destructive bony changes consistent with osteomyelitis and destructive bony changes of the right distal fibula and the 5th metatarsal head also consistent with osteomyelitis.  ABI on the right most likely falsely elevated and on the left is 0.48 consistent with severe PAD.  ABI in 2016 demonstrating R ABI 0.7 and L ABI 0.79.  Eliquis is currently being held.  She is being treated with vanc and cefepime.  Patient states she has not walked in the past few weeks.   She is known to our practice for access for ESRD and is on HD T/T/S.  She also has hx of afib and on Eliquis, pulmonary HTN, HLD, CHF, diabetes, CVA.  The pt is on a statin for cholesterol management.  The pt is not on a daily aspirin.   Other AC:  Eliquis The pt is not on meds for hypertension.   The pt is diabetic.   Tobacco hx:  Former-quit 2000  Past Medical History:  Diagnosis Date  . Arthritis   . Cancer [facility]) 2005    left breast  . CHF (congestive heart failure) ([place])   . Chronic back pain   . Chronic kidney disease 03/2014   dialysis t/th/sa  . Coronary artery disease   . Diabetes mellitus    Type 2  . Diabetic nephropathy ([place]) 01-08-13  . Dyslipidemia 01-08-13  . ESRD on hemodialysis ([place])    Tu, Th, Sat  . Headache   . Hyperlipidemia   . Hypertension   . LBBB (left bundle branch block)   . Proteinuria 01-08-13  . Pulmonary hypertension ([place])    PA peak pressure 73 mmHg 08/05/17 echo  . Thyroid disease 01-08-13   Hyper-parathyroidism-secondary  . Wears glasses     Past Surgical History:  Procedure  Laterality Date  . A/V FISTULAGRAM N/A 08/25/2017   Procedure: A/V FISTULAGRAM - Left Arm;  Surgeon: Angelia Mould, MD;  Location: [place] [facility];  Service: Cardiovascular;  Laterality: N/A;  . A/V FISTULAGRAM N/A 02/09/2018   Procedure: A/V [id number];  Surgeon: Elam Dutch, MD;  Location: [place] [facility];  Service: Cardiovascular;  Laterality: N/A;  . A/V FISTULAGRAM Left 04/04/2018   Procedure: A/V FISTULAGRAM;  Surgeon: Marty Heck, MD;  Location: [place] [facility];  Service: Cardiovascular;  Laterality: Left;  . A/V FISTULAGRAM Right 07/22/2019   Procedure: A/V FISTULAGRAM - Right Arm;  Surgeon: Waynetta Sandy, MD;  Location: [place] [facility];  Service: Cardiovascular;  Laterality: Right;  . ABDOMINAL AORTAGRAM N/A 08/11/2014   Procedure: ABDOMINAL Maxcine Ham;  Surgeon: Angelia Mould, MD;  Location: [facility] CATH LAB;  Service: Cardiovascular;  Laterality: N/A;  . ABDOMINAL HYSTERECTOMY    . APPENDECTOMY    . Arm surgery     Left arm trauma  . AV FISTULA PLACEMENT Left 08/21/2014   Procedure: LEFT ARM ARTERIOVENOUS (AV) FISTULA CREATION ;  Surgeon: Angelia Mould, MD;  Location: [place];  Service: Vascular;  Laterality: Left;  . AV FISTULA PLACEMENT Right 06/13/2018  Procedure: insertion of right arm ARTERIOVENOUS (AV) gore-tex GRAFT;  Surgeon: Rosetta Posner, MD;  Location: [place];  Service: Vascular;  Laterality: Right;  . BACK SURGERY    . CATARACT EXTRACTION W/PHACO Left 12/22/2014   Procedure: CATARACT EXTRACTION PHACO AND INTRAOCULAR LENS PLACEMENT (IOC);  Surgeon: Tonny Branch, MD;  Location: [facility];  Service: Ophthalmology;  Laterality: Left;  CDE 13.48  . CORONARY ANGIOPLASTY  12/19/2003   inferior wall hypokinesis. ef 50%  . dialysis catheter    . INSERTION OF DIALYSIS CATHETER Right 06/13/2018   Procedure: INSERTION OF DIALYSIS CATHETER, right internal jugular;  Surgeon: Rosetta Posner, MD;  Location: [place];  Service: Vascular;  Laterality:  Right;  . IR FLUORO GUIDE CV LINE RIGHT  04/06/2018  . IR REMOVAL TUN CV CATH W/O FL  04/25/2018  . IR REMOVAL TUN CV CATH W/O FL  07/20/2018  . IR US GUIDE VASC ACCESS RIGHT  04/06/2018  . LIGATION OF ARTERIOVENOUS  FISTULA Left 03/18/2019   Procedure: LIGATION OF ARTERIOVENOUS  FISTULA  LEFT ARM;  Surgeon: Angelia Mould, MD;  Location: [place];  Service: Vascular;  Laterality: Left;  Marland Kitchen MASTECTOMY     Left  . PERIPHERAL VASCULAR BALLOON ANGIOPLASTY Left 08/25/2017   Procedure: PERIPHERAL VASCULAR BALLOON ANGIOPLASTY;  Surgeon: Angelia Mould, MD;  Location: [place] [facility];  Service: Cardiovascular;  Laterality: Left;  arm fistula  . PERIPHERAL VASCULAR BALLOON ANGIOPLASTY Left 02/09/2018   Procedure: PERIPHERAL VASCULAR BALLOON ANGIOPLASTY;  Surgeon: Elam Dutch, MD;  Location: [place] [facility];  Service: Cardiovascular;  Laterality: Left;  AV Fistula  . PERIPHERAL VASCULAR BALLOON ANGIOPLASTY Left 04/04/2018   Procedure: PERIPHERAL VASCULAR BALLOON ANGIOPLASTY;  Surgeon: Marty Heck, MD;  Location: [place] [facility];  Service: Cardiovascular;  Laterality: Left;  UPPER ARM FISTULA  . PERIPHERAL VASCULAR CATHETERIZATION N/A 09/16/2015   Procedure: Fistulagram;  Surgeon: Rosetta Posner, MD;  Location: [place] [facility];  Service: Cardiovascular;  Laterality: N/A;  . SPINE SURGERY    . TONSILLECTOMY    . TRANSTHORACIC ECHOCARDIOGRAM  10/01/2010    Left ventricle: The cavity size was mildly dilated. Wall thickness was increased in a pattern of mild LVH. Systolic function was   mildly reduced. The estimated ejection fraction was in the range  of 45% to 50%.   . TUBAL LIGATION      Allergies  Allergen Reactions  . Olmesartan Other (See Comments)    Hyperkalemia     Prior to Admission medications   Medication Sig Start Date End Date Taking? Authorizing Provider  albuterol (VENTOLIN HFA) 108 (90 Base) MCG/ACT inhaler Inhale 1-2 puffs into the lungs every 6 (six)  hours as needed for wheezing or shortness of breath.   Yes [provider]  amiodarone (PACERONE) 200 MG tablet Take 1 tablet (200 mg total) by mouth daily. Patient taking differently: Take 200 mg by mouth daily. (0900) 05/30/19  Yes Strader, [place] M, PA-C  apixaban (ELIQUIS) 2.5 MG TABS tablet Take 1 tablet (2.5 mg total) by mouth 2 (two) times daily. Patient taking differently: Take 2.5 mg by mouth 2 (two) times daily. 0900 & 2100 06/13/19 07/17/20 Yes Shah, Pratik D, DO  atorvastatin (LIPITOR) 80 MG tablet Take 1 tablet (80 mg total) by mouth daily. 05/30/19  Yes Strader, [place] M, PA-C  calcitRIOL (ROCALTROL) 0.25 MCG capsule Take 0.25 mcg by mouth Every Tuesday,Thursday,and Saturday with dialysis. (1700) 03/26/14  Yes [provider]  diclofenac  Sodium (VOLTAREN) 1 % GEL Apply 2 g topically 4 (four) times daily as needed for pain. 06/26/19  Yes [provider]  docusate sodium (COLACE) 100 MG capsule Take 100 mg by mouth every other day. Hold for loose stools   Yes [provider]  lidocaine-prilocaine (EMLA) cream Apply 1 application topically Every Tuesday,Thursday,and Saturday with dialysis. 1 hour prior to dialysis   Yes [provider]  midodrine (PROAMATINE) 5 MG tablet Take 1 tablet (5 mg total) by mouth 3 (three) times daily with meals. 05/30/19  Yes Strader, [place] M, PA-C  nitroGLYCERIN (NITROSTAT) 0.4 MG SL tablet Place 0.4 mg under the tongue every 5 (five) minutes x 3 doses as needed for chest pain.    Yes [provider]  NOVOLOG MIX 70/30 FLEXPEN (70-30) 100 UNIT/ML FlexPen Inject 25-30 Units into the skin See admin instructions. 30 units in the morning & 25 units at night 06/05/19  Yes [provider]  ondansetron (ZOFRAN-ODT) 4 MG disintegrating tablet Take 4 mg by mouth every 6 (six) hours as needed for nausea or vomiting.  05/18/19  Yes [provider]  polyethylene glycol (MIRALAX / GLYCOLAX) packet Take 17 g  by mouth daily as needed for mild constipation.    Yes [provider]  sevelamer carbonate (RENVELA) 800 MG tablet Take 1,600 mg by mouth 3 (three) times daily with meals.   Yes [provider]    Social History   Socioeconomic History  . Marital status: Legally Separated    Spouse name: Not on file  . Number of children: 3  . Years of education: Not on file  . Highest education level: Not on file  Occupational History  . Occupation: Retired  Tobacco Use  . Smoking status: Former Smoker    Packs/day: 1.00    Years: 50.00    Pack years: 50.00    Types: Cigarettes    Quit date: 05/16/1998    Years since quitting: 21.2  . Smokeless tobacco: Never Used  Substance and Sexual Activity  . Alcohol use: No    Alcohol/week: 0.0 standard drinks  . Drug use: No  . Sexual activity: Yes    Birth control/protection: Post-menopausal  Other Topics Concern  . Not on file  Social History Narrative   Lives at [place] alone (most of the time).  Three grandchildren and a one year old great grandchild   Social Determinants of Radio broadcast assistant Strain:   . Difficulty of Paying Living Expenses:   Food Insecurity:   . Worried About Charity fundraiser in the Last Year:   . Arboriculturist in the Last Year:   Transportation Needs:   . Film/video editor (Medical):   Marland Kitchen Lack of Transportation (Non-Medical):   Physical Activity:   . Days of Exercise per Week:   . Minutes of Exercise per Session:   Stress:   . Feeling of Stress :   Social Connections:   . Frequency of Communication with Friends and Family:   . Frequency of Social Gatherings with Friends and Family:   . Attends Religious Services:   . Active Member of Clubs or Organizations:   . Attends Archivist Meetings:   Marland Kitchen Marital Status:   Intimate Partner Violence:   . Fear of Current or Ex-Partner:   . Emotionally Abused:   Marland Kitchen Physically Abused:   . Sexually Abused:      Family History    Problem Relation Age  of Onset  . Breast cancer Mother        Died age 74  . Cancer Mother 68       Breast  . Heart disease Mother   . Hyperlipidemia Mother   . Hypertension Mother   . Heart attack Mother     ROS: [x]  Positive   [ ]  Negative   [ ]  All sytems reviewed and are negative  Cardiac: [x]  CHF [x]  afib on Eliquis   Vascular: []  pain in legs while walking []  pain in legs at rest []  pain in legs at night [x]  non-healing ulcers []  hx of DVT []  swelling in legs  Pulmonary: []  productive cough []  asthma/wheezing []  home O2  Neurologic: [x]  hx of CVA []  mini stroke  Hematologic: [x]  hx of cancer-breast  Endocrine:   [x]  diabetes [x]  thyroid disease  GI []  vomiting blood []  blood in stool  GU: [x]  CKD/renal failure [x]  HD--[]  M/W/F or [x]  T/T/S []  burning with urination []  blood in urine  Psychiatric: []  anxiety []  depression  Musculoskeletal: [x]  arthritis [x]  chronic back pain   Integumentary: []  rashes []  ulcers  Constitutional: []  fever []  chills   Physical Examination  Vitals:   08/21/19 0547 08/21/19 0835  BP: (!) 120/59 (!) 111/53  Pulse: 77 79  Resp: 16 18  Temp: 99 F (37.2 C) 98.6 F (37 C)  SpO2: 100% 99%   Body mass index is 34.12 kg/m.  General:  WDWN in NAD Gait: Not observed HENT: WNL, normocephalic Pulmonary: normal non-labored breathing, without Rales, rhonchi,  wheezing Cardiac: regular Abdomen:  soft, NT/ND, no masses Skin: without rashes Vascular Exam/Pulses:  Right Left  Radial 2+ (normal) 2+ (normal)  Femoral 2+ (normal) 2+ (normal)  Popliteal absent absent  DP absent absent  PT absent absent   Extremities: R lateral malleolus ulceration; L heel ulceration Musculoskeletal: no muscle wasting or atrophy  Neurologic: A&O X 3;  No focal weakness or paresthesias are detected; speech is fluent/normal Psychiatric:  The pt has Normal affect.   CBC    Component Value Date/Time   WBC 8.3 08/21/2019  0630   RBC 3.85 (L) 08/21/2019 0630   HGB 9.6 (L) 08/21/2019 0630   HCT 30.1 (L) 08/21/2019 0630   PLT 224 08/21/2019 0630   MCV 78.2 (L) 08/21/2019 0630   MCH 24.9 (L) 08/21/2019 0630   MCHC 31.9 08/21/2019 0630   RDW 18.9 (H) 08/21/2019 0630   LYMPHSABS 1.0 08/21/2019 0630   MONOABS 1.1 (H) 08/21/2019 0630   EOSABS 0.4 08/21/2019 0630   BASOSABS 0.1 08/21/2019 0630    BMET    Component Value Date/Time   NA 136 08/21/2019 0630   K 3.7 08/21/2019 0630   CL 95 (L) 08/21/2019 0630   CO2 26 08/21/2019 0630   GLUCOSE 132 (H) 08/21/2019 0630   BUN 28 (H) 08/21/2019 0630   CREATININE 6.80 (H) 08/21/2019 0630   CALCIUM 8.6 (L) 08/21/2019 0630   GFRNONAA 6 (L) 08/21/2019 0630   GFRAA 6 (L) 08/21/2019 0630    COAGS: Lab Results  Component Value Date   INR 3.1 (H) 06/11/2019   INR 1.0 05/09/2019   INR 1.12 04/06/2018     Non-Invasive Vascular Imaging:   ABI 08/21/2019 Right:  ABI is within normal range by ATA pressure, however abnormal pedal artery  waveforms suggest this is falsely elevated. ABI is in the moderate range  based on PTA pressure. Severely dampened PPG waveform of Great toe.   Left:  Resting left ankle-brachial index indicates severe left lower  extremity arterial disease.   Severely dampened PPG waveform of Great toe.   ASSESSMENT/PLAN: This is a 74 y.o. female with osteomyelitis/ulcerations bilateral ulcerations  ABI today likely falsely elevated; 2016 study R ABI 0.7 and L ABI 0.79 likely SFA occlusions at that time Currently on IV vanc and cefepime for BLE osteomyelitis She is at high risk for bilateral LE limb loss Plan is for aortogram with BLE runoff and possible intervention; patient is agreeable to this procedure Continue to hold Eliquis On call surgeon Dr. Donzetta Matters will evaluate the patient later today  Dagoberto Ligas, PA-C [facility] [phone]   I have independently interviewed and examined patient and agree with PA  assessment and plan above.  Patient high risk for left lower extremity amputation.  Right side has a lateral ankle ulceration.  We will begin with aortogram bilateral lower extremity runoff to focus on the right.  She will be n.p.o. past midnight we will plan for second case tomorrow morning.  Terence Googe C. Donzetta Matters, MD [facility] [place] Office: [phone] Pager: [phone]

## 2019-08-21 NOTE — Progress Notes (Signed)
ABI       has been completed. Preliminary results can be found under CV proc through chart review. Osie Amparo, BS, RDMS, RVT   

## 2019-08-21 NOTE — Consult Note (Addendum)
Hospital Consult    Reason for Consult:  Non healing wounds BLE Requesting Physician:  Sharol Given MRN #:  [id number]  History of Present Illness: This is a 74 y.o. female who was admitted a couple of days ago with one month hx of bilateral heel ulcers and pain in her feet.  She had xrays that revealed osteomyelitis.  Dr. Sharol Given was consulted and upon his review of the foot xrays, her xray on the left calcaneous reveals destructive bony changes consistent with osteomyelitis and destructive bony changes of the right distal fibula and the 5th metatarsal head also consistent with osteomyelitis.  ABI on the right most likely falsely elevated and on the left is 0.48 consistent with severe PAD.  ABI in 2016 demonstrating R ABI 0.7 and L ABI 0.79.  Eliquis is currently being held.  She is being treated with vanc and cefepime.  Patient states she has not walked in the past few weeks.   She is known to our practice for access for ESRD and is on HD T/T/S.  She also has hx of afib and on Eliquis, pulmonary HTN, HLD, CHF, diabetes, CVA.  The pt is on a statin for cholesterol management.  The pt is not on a daily aspirin.   Other AC:  Eliquis The pt is not on meds for hypertension.   The pt is diabetic.   Tobacco hx:  Former-quit 2000  Past Medical History:  Diagnosis Date  . Arthritis   . Cancer [facility]) 2005    left breast  . CHF (congestive heart failure) ([place])   . Chronic back pain   . Chronic kidney disease 03/2014   dialysis t/th/sa  . Coronary artery disease   . Diabetes mellitus    Type 2  . Diabetic nephropathy ([place]) 01-08-13  . Dyslipidemia 01-08-13  . ESRD on hemodialysis ([place])    Tu, Th, Sat  . Headache   . Hyperlipidemia   . Hypertension   . LBBB (left bundle branch block)   . Proteinuria 01-08-13  . Pulmonary hypertension ([place])    PA peak pressure 73 mmHg 08/05/17 echo  . Thyroid disease 01-08-13   Hyper-parathyroidism-secondary  . Wears glasses     Past Surgical History:  Procedure  Laterality Date  . A/V FISTULAGRAM N/A 08/25/2017   Procedure: A/V FISTULAGRAM - Left Arm;  Surgeon: Angelia Mould, MD;  Location: [place] CV LAB;  Service: Cardiovascular;  Laterality: N/A;  . A/V FISTULAGRAM N/A 02/09/2018   Procedure: A/V [id number];  Surgeon: Elam Dutch, MD;  Location: [place] [facility];  Service: Cardiovascular;  Laterality: N/A;  . A/V FISTULAGRAM Left 04/04/2018   Procedure: A/V FISTULAGRAM;  Surgeon: Marty Heck, MD;  Location: [place] CV LAB;  Service: Cardiovascular;  Laterality: Left;  . A/V FISTULAGRAM Right 07/22/2019   Procedure: A/V FISTULAGRAM - Right Arm;  Surgeon: Waynetta Sandy, MD;  Location: [place] CV LAB;  Service: Cardiovascular;  Laterality: Right;  . ABDOMINAL AORTAGRAM N/A 08/11/2014   Procedure: ABDOMINAL Maxcine Ham;  Surgeon: Angelia Mould, MD;  Location: [facility] CATH LAB;  Service: Cardiovascular;  Laterality: N/A;  . ABDOMINAL HYSTERECTOMY    . APPENDECTOMY    . Arm surgery     Left arm trauma  . AV FISTULA PLACEMENT Left 08/21/2014   Procedure: LEFT ARM ARTERIOVENOUS (AV) FISTULA CREATION ;  Surgeon: Angelia Mould, MD;  Location: [place];  Service: Vascular;  Laterality: Left;  . AV FISTULA PLACEMENT Right 06/13/2018  Procedure: insertion of right arm ARTERIOVENOUS (AV) gore-tex GRAFT;  Surgeon: Rosetta Posner, MD;  Location: [place];  Service: Vascular;  Laterality: Right;  . BACK SURGERY    . CATARACT EXTRACTION W/PHACO Left 12/22/2014   Procedure: CATARACT EXTRACTION PHACO AND INTRAOCULAR LENS PLACEMENT (IOC);  Surgeon: Tonny Branch, MD;  Location: [facility];  Service: Ophthalmology;  Laterality: Left;  CDE 13.48  . CORONARY ANGIOPLASTY  12/19/2003   inferior wall hypokinesis. ef 50%  . dialysis catheter    . INSERTION OF DIALYSIS CATHETER Right 06/13/2018   Procedure: INSERTION OF DIALYSIS CATHETER, right internal jugular;  Surgeon: Rosetta Posner, MD;  Location: [place];  Service: Vascular;  Laterality:  Right;  . IR FLUORO GUIDE CV LINE RIGHT  04/06/2018  . IR REMOVAL TUN CV CATH W/O FL  04/25/2018  . IR REMOVAL TUN CV CATH W/O FL  07/20/2018  . IR US GUIDE VASC ACCESS RIGHT  04/06/2018  . LIGATION OF ARTERIOVENOUS  FISTULA Left 03/18/2019   Procedure: LIGATION OF ARTERIOVENOUS  FISTULA  LEFT ARM;  Surgeon: Angelia Mould, MD;  Location: [place];  Service: Vascular;  Laterality: Left;  Marland Kitchen MASTECTOMY     Left  . PERIPHERAL VASCULAR BALLOON ANGIOPLASTY Left 08/25/2017   Procedure: PERIPHERAL VASCULAR BALLOON ANGIOPLASTY;  Surgeon: Angelia Mould, MD;  Location: [place] CV LAB;  Service: Cardiovascular;  Laterality: Left;  arm fistula  . PERIPHERAL VASCULAR BALLOON ANGIOPLASTY Left 02/09/2018   Procedure: PERIPHERAL VASCULAR BALLOON ANGIOPLASTY;  Surgeon: Elam Dutch, MD;  Location: [place] CV LAB;  Service: Cardiovascular;  Laterality: Left;  AV Fistula  . PERIPHERAL VASCULAR BALLOON ANGIOPLASTY Left 04/04/2018   Procedure: PERIPHERAL VASCULAR BALLOON ANGIOPLASTY;  Surgeon: Marty Heck, MD;  Location: [place] [facility];  Service: Cardiovascular;  Laterality: Left;  UPPER ARM FISTULA  . PERIPHERAL VASCULAR CATHETERIZATION N/A 09/16/2015   Procedure: Fistulagram;  Surgeon: Rosetta Posner, MD;  Location: [place] CV LAB;  Service: Cardiovascular;  Laterality: N/A;  . SPINE SURGERY    . TONSILLECTOMY    . TRANSTHORACIC ECHOCARDIOGRAM  10/01/2010    Left ventricle: The cavity size was mildly dilated. Wall thickness was increased in a pattern of mild LVH. Systolic function was   mildly reduced. The estimated ejection fraction was in the range  of 45% to 50%.   . TUBAL LIGATION      Allergies  Allergen Reactions  . Olmesartan Other (See Comments)    Hyperkalemia     Prior to Admission medications   Medication Sig Start Date End Date Taking? Authorizing Provider  albuterol (VENTOLIN HFA) 108 (90 Base) MCG/ACT inhaler Inhale 1-2 puffs into the lungs every 6 (six)  hours as needed for wheezing or shortness of breath.   Yes [provider]  amiodarone (PACERONE) 200 MG tablet Take 1 tablet (200 mg total) by mouth daily. Patient taking differently: Take 200 mg by mouth daily. (0900) 05/30/19  Yes Strader, [place] M, PA-C  apixaban (ELIQUIS) 2.5 MG TABS tablet Take 1 tablet (2.5 mg total) by mouth 2 (two) times daily. Patient taking differently: Take 2.5 mg by mouth 2 (two) times daily. 0900 & 2100 06/13/19 07/17/20 Yes Shah, Pratik D, DO  atorvastatin (LIPITOR) 80 MG tablet Take 1 tablet (80 mg total) by mouth daily. 05/30/19  Yes Strader, [place] M, PA-C  calcitRIOL (ROCALTROL) 0.25 MCG capsule Take 0.25 mcg by mouth Every Tuesday,Thursday,and Saturday with dialysis. (1700) 03/26/14  Yes [provider]  diclofenac  Sodium (VOLTAREN) 1 % GEL Apply 2 g topically 4 (four) times daily as needed for pain. 06/26/19  Yes [provider]  docusate sodium (COLACE) 100 MG capsule Take 100 mg by mouth every other day. Hold for loose stools   Yes [provider]  lidocaine-prilocaine (EMLA) cream Apply 1 application topically Every Tuesday,Thursday,and Saturday with dialysis. 1 hour prior to dialysis   Yes [provider]  midodrine (PROAMATINE) 5 MG tablet Take 1 tablet (5 mg total) by mouth 3 (three) times daily with meals. 05/30/19  Yes Strader, [place] M, PA-C  nitroGLYCERIN (NITROSTAT) 0.4 MG SL tablet Place 0.4 mg under the tongue every 5 (five) minutes x 3 doses as needed for chest pain.    Yes [provider]  NOVOLOG MIX 70/30 FLEXPEN (70-30) 100 UNIT/ML FlexPen Inject 25-30 Units into the skin See admin instructions. 30 units in the morning & 25 units at night 06/05/19  Yes [provider]  ondansetron (ZOFRAN-ODT) 4 MG disintegrating tablet Take 4 mg by mouth every 6 (six) hours as needed for nausea or vomiting.  05/18/19  Yes [provider]  polyethylene glycol (MIRALAX / GLYCOLAX) packet Take 17 g  by mouth daily as needed for mild constipation.    Yes [provider]  sevelamer carbonate (RENVELA) 800 MG tablet Take 1,600 mg by mouth 3 (three) times daily with meals.   Yes [provider]    Social History   Socioeconomic History  . Marital status: Legally Separated    Spouse name: Not on file  . Number of children: 3  . Years of education: Not on file  . Highest education level: Not on file  Occupational History  . Occupation: Retired  Tobacco Use  . Smoking status: Former Smoker    Packs/day: 1.00    Years: 50.00    Pack years: 50.00    Types: Cigarettes    Quit date: 05/16/1998    Years since quitting: 21.2  . Smokeless tobacco: Never Used  Substance and Sexual Activity  . Alcohol use: No    Alcohol/week: 0.0 standard drinks  . Drug use: No  . Sexual activity: Yes    Birth control/protection: Post-menopausal  Other Topics Concern  . Not on file  Social History Narrative   Lives at [place] alone (most of the time).  Three grandchildren and a one year old great grandchild   Social Determinants of Radio broadcast assistant Strain:   . Difficulty of Paying Living Expenses:   Food Insecurity:   . Worried About Charity fundraiser in the Last Year:   . Arboriculturist in the Last Year:   Transportation Needs:   . Film/video editor (Medical):   Marland Kitchen Lack of Transportation (Non-Medical):   Physical Activity:   . Days of Exercise per Week:   . Minutes of Exercise per Session:   Stress:   . Feeling of Stress :   Social Connections:   . Frequency of Communication with Friends and Family:   . Frequency of Social Gatherings with Friends and Family:   . Attends Religious Services:   . Active Member of Clubs or Organizations:   . Attends Archivist Meetings:   Marland Kitchen Marital Status:   Intimate Partner Violence:   . Fear of Current or Ex-Partner:   . Emotionally Abused:   Marland Kitchen Physically Abused:   . Sexually Abused:      Family History    Problem Relation Age  of Onset  . Breast cancer Mother        Died age 58  . Cancer Mother 41       Breast  . Heart disease Mother   . Hyperlipidemia Mother   . Hypertension Mother   . Heart attack Mother     ROS: [x]  Positive   [ ]  Negative   [ ]  All sytems reviewed and are negative  Cardiac: [x]  CHF [x]  afib on Eliquis   Vascular: []  pain in legs while walking []  pain in legs at rest []  pain in legs at night [x]  non-healing ulcers []  hx of DVT []  swelling in legs  Pulmonary: []  productive cough []  asthma/wheezing []  home O2  Neurologic: [x]  hx of CVA []  mini stroke  Hematologic: [x]  hx of cancer-breast  Endocrine:   [x]  diabetes [x]  thyroid disease  GI []  vomiting blood []  blood in stool  GU: [x]  CKD/renal failure [x]  HD--[]  M/W/F or [x]  T/T/S []  burning with urination []  blood in urine  Psychiatric: []  anxiety []  depression  Musculoskeletal: [x]  arthritis [x]  chronic back pain   Integumentary: []  rashes []  ulcers  Constitutional: []  fever []  chills   Physical Examination  Vitals:   08/21/19 0547 08/21/19 0835  BP: (!) 120/59 (!) 111/53  Pulse: 77 79  Resp: 16 18  Temp: 99 F (37.2 C) 98.6 F (37 C)  SpO2: 100% 99%   Body mass index is 34.12 kg/m.  General:  WDWN in NAD Gait: Not observed HENT: WNL, normocephalic Pulmonary: normal non-labored breathing, without Rales, rhonchi,  wheezing Cardiac: regular Abdomen:  soft, NT/ND, no masses Skin: without rashes Vascular Exam/Pulses:  Right Left  Radial 2+ (normal) 2+ (normal)  Femoral 2+ (normal) 2+ (normal)  Popliteal absent absent  DP absent absent  PT absent absent   Extremities: R lateral malleolus ulceration; L heel ulceration Musculoskeletal: no muscle wasting or atrophy  Neurologic: A&O X 3;  No focal weakness or paresthesias are detected; speech is fluent/normal Psychiatric:  The pt has Normal affect.   CBC    Component Value Date/Time   WBC 8.3 08/21/2019  0630   RBC 3.85 (L) 08/21/2019 0630   HGB 9.6 (L) 08/21/2019 0630   HCT 30.1 (L) 08/21/2019 0630   PLT 224 08/21/2019 0630   MCV 78.2 (L) 08/21/2019 0630   MCH 24.9 (L) 08/21/2019 0630   MCHC 31.9 08/21/2019 0630   RDW 18.9 (H) 08/21/2019 0630   LYMPHSABS 1.0 08/21/2019 0630   MONOABS 1.1 (H) 08/21/2019 0630   EOSABS 0.4 08/21/2019 0630   BASOSABS 0.1 08/21/2019 0630    BMET    Component Value Date/Time   NA 136 08/21/2019 0630   K 3.7 08/21/2019 0630   CL 95 (L) 08/21/2019 0630   CO2 26 08/21/2019 0630   GLUCOSE 132 (H) 08/21/2019 0630   BUN 28 (H) 08/21/2019 0630   CREATININE 6.80 (H) 08/21/2019 0630   CALCIUM 8.6 (L) 08/21/2019 0630   GFRNONAA 6 (L) 08/21/2019 0630   GFRAA 6 (L) 08/21/2019 0630    COAGS: Lab Results  Component Value Date   INR 3.1 (H) 06/11/2019   INR 1.0 05/09/2019   INR 1.12 04/06/2018     Non-Invasive Vascular Imaging:   ABI 08/21/2019 Right:  ABI is within normal range by ATA pressure, however abnormal pedal artery  waveforms suggest this is falsely elevated. ABI is in the moderate range  based on PTA pressure. Severely dampened PPG waveform of Great toe.   Left:  Resting left ankle-brachial index indicates severe left lower  extremity arterial disease.   Severely dampened PPG waveform of Great toe.   ASSESSMENT/PLAN: This is a 74 y.o. female with osteomyelitis/ulcerations bilateral ulcerations  ABI today likely falsely elevated; 2016 study R ABI 0.7 and L ABI 0.79 likely SFA occlusions at that time Currently on IV vanc and cefepime for BLE osteomyelitis She is at high risk for bilateral LE limb loss Plan is for aortogram with BLE runoff and possible intervention; patient is agreeable to this procedure Continue to hold Eliquis On call surgeon Dr. Donzetta Matters will evaluate the patient later today  Dagoberto Ligas, PA-C [facility] [phone]   I have independently interviewed and examined patient and agree with PA  assessment and plan above.  Patient high risk for left lower extremity amputation.  Right side has a lateral ankle ulceration.  We will begin with aortogram bilateral lower extremity runoff to focus on the right.  She will be n.p.o. past midnight we will plan for second case tomorrow morning.  Dex Blakely C. Donzetta Matters, MD [facility] [place] Office: [phone] Pager: [phone]

## 2019-08-21 NOTE — Progress Notes (Signed)
[facility] Progress Note   Subjective:  Completed HD with 2.5L UF. Seen in room, husband at bedside. No complaints this am.   Objective Vitals:   08/20/19 1717 08/20/19 2054 08/21/19 0547 08/21/19 0835  BP: 98/63 (!) 103/46 (!) 120/59 (!) 111/53  Pulse: 75 72 77 79  Resp: 18 18 16 18   Temp: 99.1 F (37.3 C) 98.5 F (36.9 C) 99 F (37.2 C) 98.6 F (37 C)  TempSrc: Oral Oral Oral Oral  SpO2: 94% 96% 100% 99%  Weight:  95.9 kg       Additional Objective Labs: Basic Metabolic Panel: Recent Labs  Lab 08/19/19 0533 08/20/19 0420 08/21/19 0630  NA 139 135 136  K 3.5 3.7 3.7  CL 97* 94* 95*  CO2 27 24 26   GLUCOSE 113* 114* 132*  BUN 38* 50* 28*  CREATININE 7.53* 9.46* 6.80*  CALCIUM 8.0* 8.3* 8.6*   CBC: Recent Labs  Lab 08/19/19 0533 08/20/19 0420 08/21/19 0630  WBC 8.0 8.7 8.3  NEUTROABS  --  5.9 5.8  HGB 8.8* 9.2* 9.6*  HCT 27.9* 28.9* 30.1*  MCV 78.6* 78.1* 78.2*  PLT 218 227 224   Blood Culture    Component Value Date/Time   SDES BLOOD LEFT HAND 08/18/2019 2017   SPECREQUEST  08/18/2019 2017    BOTTLES DRAWN AEROBIC AND ANAEROBIC Blood Culture adequate volume   CULT  08/18/2019 2017    NO GROWTH 3 DAYS Performed at [facility], [place] [address], [place], [place] [address]    REPTSTATUS PENDING 08/18/2019 2017     Physical Exam General: Alert, well appearing, nad  Heart: RRR  Lungs: clear bilaterally  Abdomen: soft non-tender Extremities: no sig LE edema; feet in booties  Dialysis Access: RUE AVG +bruit   Medications: . ceFEPime (MAXIPIME) IV 1 g (08/20/19 2004)  . vancomycin Stopped (08/20/19 1049)   . amiodarone  200 mg Oral Daily  . atorvastatin  80 mg Oral Daily  . Chlorhexidine Gluconate Cloth  6 each Topical Q0600  . collagenase   Topical Daily  . darbepoetin (ARANESP) injection - DIALYSIS  60 mcg Intravenous Q Tue-HD  . docusate sodium  100 mg Oral QODAY  . doxercalciferol  3 mcg Intravenous Q T,Th,Sa-HD  .  ferric citrate  420 mg Oral TID WC  . heparin injection (subcutaneous)  5,000 Units Subcutaneous Q8H  . insulin aspart  0-5 Units Subcutaneous QHS  . insulin aspart  0-9 Units Subcutaneous TID WC  . midodrine  5 mg Oral TID WC  . multivitamin  1 tablet Oral QHS    Dialysis Orders:  [facility] 4.25 hr 400/600 96 kg 2.0 K/2.5 Ca R AVG -Heparin 2000 units IV initial bolus then 1000 units/hr. Stop 1 hour before end of treatment -Epogen 5400 units IV TIW -Hectorol 2.5 mcg IV TIW -Venofer 50 mg IV weekly  Assessment/Plan: 1.  PAD/Bilateral foot ulcers/osteomyelitis. BCx NGTD. On vanc, per primary Ortho following 2.  ESRD -  HD TTS.  HD today on schedule  3.  Hypertension/volume  -BP ok. On Midodrine for BP support. No evidence of volume excess. Is under OP EDW UF as tolerated.  4.  Anemia  -HGB 9.6  Epogen not on formulary.  Aranesp 60 mcg IV with HD 4/6.  5.  Metabolic bone disease - Ca ok.  Calcitriol and hectorol both on OP med list. .  DC oral calcitriol. Continue Auryxia 210 mg PO TID AC 1 tab with snack.  6.  Nutrition - Renal/Carb mod diet.  7.  DM- per primary.   Lynnda Child PA-C [facility] Pager [phone] 08/21/2019,10:39 AM

## 2019-08-21 NOTE — Progress Notes (Signed)
PROGRESS NOTE    Alexis [name]  PXT:[id number] DOB: [birth year] DOA: 08/18/2019 PCP: System, Provider Not In   Brief Narrative:  HPI: Alexis [name] is a 74 y.o. female with medical history significant of A. fib on Eliquis, ESRD on HD TTS, CAD, pulmonary hypertension, LBBB, hypertension, hyperlipidemia, CHF, type 2 diabetes, diabetic neuropathy, CVA presented to [facility] ED with complaints of bilateral heel ulcers.  Diagnosed with osteomyelitis of the right fifth metatarsal head and transferred to [facility] for further management as she is a dialysis patient.  Patient reports 1 month history of bilateral heel ulcers and pain in her feet.  Denies fevers or chills.  States she vomited once after eating breakfast today but no further episodes of vomiting or nausea since then.  Denies abdominal pain or diarrhea.    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  Followed by orthopedics and vascular surgery.  ABI pending.Continue to hold anticoagulation for anticipated surgical procedure.  Further management per orthopedics.  Paroxysmal atrial fibrillation: Hold Eliquis at this time.   Will likely need surgery given osteomyelitis, rate controlled.  Continue home dose of amiodarone.  Monitor on telemetry.  ESRD on HD: Receiving dialysis on a schedule as per nephrology.  Insulin-dependent type 2 diabetes: Takes 70/30 insulin at home.  Blood sugars well controlled currently on sliding scale insulin.  Anemia of chronic disease.  Hemoglobin stable.   DVT prophylaxis:  heparin DVT prophylaxis dose   Code Status: Full Code  Family Communication: Patient. Patient is from: Home Disposition Plan: Patient from home.  Discharge plan Home, may need home health. Barriers to discharge: Pending consultation and further surgical intervention for osteomyelitis by orthopedics   Estimated body mass index is 34.12 kg/m as calculated from the following:   Height as of 07/22/19: 5\' 6"  (1.676 m).   Weight as of this encounter: 95.9 kg.  Pressure Injury 08/18/19 Buttocks Left Stage 2 -  Partial thickness loss of dermis presenting as a shallow open injury with a red, pink wound bed without slough. (Active)  08/18/19 1930  Location: Buttocks  Location Orientation: Left  Staging: Stage 2 -  Partial thickness loss of dermis presenting as a shallow open injury with a red, pink wound bed without slough.  Wound Description (Comments):   Present on Admission: Yes     Nutritional status:               Consultants:   Orthopedics  Vascular surgery  Procedures:   None  Antimicrobials:  Anti-infectives (From admission, onward)   Start     Dose/Rate Route Frequency Ordered Stop   08/20/19 1200  vancomycin (VANCOCIN) IVPB 1000 mg/200 mL premix     1,000 mg 200 mL/hr over 60 Minutes Intravenous Every T-Th-Sa (Hemodialysis) 08/19/19 1501  08/19/19 2000  ceFEPIme (MAXIPIME) 1 g in sodium chloride 0.9 % 100 mL IVPB     1 g 200 mL/hr over 30 Minutes Intravenous Every 24 hours 08/18/19 2000     08/18/19 2015  ceFEPIme (MAXIPIME) 2 g in sodium chloride 0.9 % 100 mL IVPB     2  g 200 mL/hr over 30 Minutes Intravenous  Once 08/18/19 2000 08/18/19 2139   08/18/19 2015  vancomycin (VANCOREADY) IVPB 500 mg/100 mL     500 mg 100 mL/hr over 60 Minutes Intravenous  Once 08/18/19 2000 08/18/19 2339   08/18/19 2000  vancomycin variable dose per unstable renal function (pharmacist dosing)  Status:  Discontinued      Does not apply See admin instructions 08/18/19 2000 08/19/19 1501         Subjective: Patient seen and examined.  No overnight events.  Herself denies any complaints.  Objective: Vitals:   08/20/19 1717 08/20/19 2054 08/21/19 0547 08/21/19 0835  BP: 98/63 (!) 103/46 (!) 120/59 (!) 111/53  Pulse: 75 72 77 79  Resp: 18 18 16 18   Temp: 99.1 F (37.3 C) 98.5 F (36.9 C) 99 F (37.2 C) 98.6 F (37 C)  TempSrc: Oral Oral Oral Oral  SpO2: 94% 96% 100% 99%  Weight:  95.9 kg      Intake/Output Summary (Last 24 hours) at 08/21/2019 1321 Last data filed at 08/21/2019 0914 Gross per 24 hour  Intake 480 ml  Output 1 ml  Net 479 ml   Filed Weights   08/20/19 0654 08/20/19 1109 08/20/19 2054  Weight: 98.2 kg 95.1 kg 95.9 kg    Examination:  General exam: Appears calm and comfortable , chronically sick looking. Respiratory system: Clear to auscultation. Respiratory effort normal. Cardiovascular system: S1 & S2 heard, irregularly irregular rate and rhythm. No JVD, murmurs, rubs, gallops or clicks. No pedal edema. Gastrointestinal system: Abdomen is nondistended, soft and nontender. No organomegaly or masses felt. Normal bowel sounds heard. Central nervous system: Alert and oriented. No focal neurological deficits. Extremities: Symmetric 5 x 5 power.  Necrotic head of the second and third right toe.  Missing right fifth toe.  Open ulcer calcaneum with some drainage.  Psychiatry: Judgement and insight appear poor.  Mood & affect flat.  Data Reviewed: I have personally reviewed following labs and imaging studies  CBC: Recent Labs  Lab 08/19/19 0533  08/20/19 0420 08/21/19 0630  WBC 8.0 8.7 8.3  NEUTROABS  --  5.9 5.8  HGB 8.8* 9.2* 9.6*  HCT 27.9* 28.9* 30.1*  MCV 78.6* 78.1* 78.2*  PLT 218 227 017   Basic Metabolic Panel: Recent Labs  Lab 08/19/19 0533 08/20/19 0420 08/21/19 0630  NA 139 135 136  K 3.5 3.7 3.7  CL 97* 94* 95*  CO2 27 24 26   GLUCOSE 113* 114* 132*  BUN 38* 50* 28*  CREATININE 7.53* 9.46* 6.80*  CALCIUM 8.0* 8.3* 8.6*   GFR: Estimated Creatinine Clearance: 8.6 mL/min (A) (by C-G formula based on SCr of 6.8 mg/dL (H)). Liver Function Tests: Recent Labs  Lab 08/20/19 0420 08/21/19 0630  AST 12* 12*  ALT 10 6  ALKPHOS 48 49  BILITOT 0.8 0.6  PROT 7.7 7.8  ALBUMIN 2.9* 2.8*   No results for input(s): LIPASE, AMYLASE in the last 168 hours. No results for input(s): AMMONIA in the last 168 hours. Coagulation Profile: No results for input(s): INR, PROTIME in the last 168 hours. Cardiac Enzymes: No results for input(s): CKTOTAL, CKMB, CKMBINDEX,  TROPONINI in the last 168 hours. BNP (last 3 results) No results for input(s): PROBNP in the last 8760 hours. HbA1C: Recent Labs    08/18/19 2009  HGBA1C 6.2*   CBG: Recent Labs  Lab 08/20/19 1155 08/20/19 1633 08/20/19 2055 08/21/19 0644 08/21/19 1111  GLUCAP 95 137* 143* 126* 124*   Lipid Profile: No results for input(s): CHOL, HDL, LDLCALC, TRIG, CHOLHDL, LDLDIRECT in the last 72 hours. Thyroid Function Tests: No results for input(s): TSH, T4TOTAL, FREET4, T3FREE, THYROIDAB in the last 72 hours. Anemia Panel: Recent Labs    08/19/19 0533  VITAMINB12 308  FOLATE 8.0  FERRITIN 382*  TIBC 230*  IRON 33  RETICCTPCT 1.4   Sepsis Labs: No results for input(s): PROCALCITON, LATICACIDVEN in the last 168 hours.  Recent Results (from the past 240 hour(s))  Culture, blood (routine x 2)     Status: None (Preliminary result)   Collection Time: 08/18/19  8:09 PM   Specimen: BLOOD LEFT HAND  Result Value Ref Range Status   Specimen  Description BLOOD LEFT HAND  Final   Special Requests   Final    BOTTLES DRAWN AEROBIC AND ANAEROBIC Blood Culture adequate volume   Culture   Final    NO GROWTH 3 DAYS Performed at [facility], [address], [place], [place] [address]    Report Status PENDING  Incomplete  Culture, blood (routine x 2)     Status: None (Preliminary result)   Collection Time: 08/18/19  8:17 PM   Specimen: BLOOD LEFT HAND  Result Value Ref Range Status   Specimen Description BLOOD LEFT HAND  Final   Special Requests   Final    BOTTLES DRAWN AEROBIC AND ANAEROBIC Blood Culture adequate volume   Culture   Final    NO GROWTH 3 DAYS Performed at [facility], [place] [address], [place], [place] [address]    Report Status PENDING  Incomplete  MRSA PCR Screening     Status: None   Collection Time: 08/19/19  8:23 AM   Specimen: Nasal Mucosa; Nasopharyngeal  Result Value Ref Range Status   MRSA by PCR NEGATIVE NEGATIVE Final    Comment:        The GeneXpert MRSA Assay (FDA approved for NASAL specimens only), is one component of a comprehensive MRSA colonization surveillance program. It is not intended to diagnose MRSA infection nor to guide or monitor treatment for MRSA infections. Performed at [facility], [place] [address], [place], [place] [address]       Radiology Studies: DG Ankle Complete Left  Result Date: 08/19/2019 CLINICAL DATA:  Pain and swelling. Osteomyelitis. EXAM: LEFT ANKLE COMPLETE - 3+ VIEW COMPARISON:  None. FINDINGS: There is no acute displaced fracture or dislocation. There is osteopenia which limits detection of nondisplaced fractures. There may be ulceration overlying the medial malleolus without definite evidence for underlying osteomyelitis. There is a moderate-sized Achilles tendon enthesophyte. There is a small plantar calcaneal spur. Advanced vascular calcifications are noted. IMPRESSION: 1. No acute displaced fracture or dislocation. 2. Possible ulceration  overlying the medial malleolus without definite evidence for underlying osteomyelitis. 3. Advanced vascular calcifications. Electronically Signed   By: Constance Holster M.D.   On: 08/19/2019 19:56   DG Ankle Complete Right  Result Date: 08/19/2019 CLINICAL DATA:  Osteomyelitis of the ankle and foot. EXAM: RIGHT ANKLE - COMPLETE 3+ VIEW COMPARISON:  February 01, 2013 FINDINGS: There are advanced degenerative changes of the ankle mortise with surrounding  soft tissue swelling. There is no acute displaced fracture. No dislocation. Advanced degenerative changes are noted of the midfoot. There is a moderate-sized plantar calcaneal spur. There is an Achilles tendon enthesophyte. Vascular calcifications are noted. There is no radiographic evidence for osteomyelitis. IMPRESSION: 1. No acute osseous abnormality. 2. Advanced degenerative changes are noted of the midfoot and ankle mortise. Electronically Signed   By: Constance Holster M.D.   On: 08/19/2019 19:57   DG Foot 2 Views Left  Result Date: 08/19/2019 CLINICAL DATA:  Osteomyelitis of the ankle or foot. EXAM: LEFT FOOT - 2 VIEW COMPARISON:  08/18/2019 FINDINGS: Again noted is a foreign body in the medial soft tissues of the left foot. There are degenerative changes of the interphalangeal joints. Advanced vascular calcifications are noted. There is a moderate-sized plantar calcaneal spur. There is no acute displaced fracture or dislocation. There is no definite radiographic evidence for osteomyelitis. IMPRESSION: 1. No definite radiographic evidence for osteomyelitis. 2. Stable foreign body in the medial soft tissues of the left foot. 3. Degenerative changes of the interphalangeal joints. Electronically Signed   By: Constance Holster M.D.   On: 08/19/2019 19:55   DG Foot Complete Right  Result Date: 08/19/2019 CLINICAL DATA:  Osteomyelitis. 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 There is a lucency at the head of the fifth metatarsal. There is some surrounding soft tissue swelling. Vascular calcifications are noted. There are degenerative changes of the midfoot. IMPRESSION: 1. No acute displaced fracture or dislocation. 2. 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 There is a persistent ill-defined lucency at the head of the fifth metatarsal which can be seen in patients with osteomyelitis. Electronically Signed   By: Constance Holster M.D.   On: 08/19/2019 19:59   VAS [place] ABI WITH/WO TBI  Result Date: 08/21/2019 LOWER EXTREMITY DOPPLER STUDY Indications: Gangrene, and peripheral artery disease. High Risk Factors: Diabetes.  Performing Technologist: June Leap Rvt, Rdms  Examination Guidelines: A complete evaluation includes at minimum, Doppler waveform signals and systolic blood pressure reading at the level of bilateral brachial, anterior tibial, and posterior tibial arteries, when vessel segments are accessible. Bilateral testing is considered an integral part of a complete examination. Photoelectric Plethysmograph (PPG) waveforms and toe systolic pressure readings are included as required and additional duplex testing as needed. Limited examinations for reoccurring indications may be performed as noted.  ABI Findings: +---------+-----------------+-----+------------------+-------------------------+ Right    Rt Pressure      IndexWaveform          Comment                            (mmHg)                                                            +---------+-----------------+-----+------------------+-------------------------+ Brachial                                         BP not taken-restricted  arm                       +---------+-----------------+-----+------------------+-------------------------+ ATA      206              1.02 dampened                                                                    monophasic                                  +---------+-----------------+-----+------------------+-------------------------+ PTA      147              0.73 dampened                                                                   monophasic                                  +---------+-----------------+-----+------------------+-------------------------+ Great Toe                      Abnormal                                    +---------+-----------------+-----+------------------+-------------------------+ +---------+------------------+-----+-------------------+-------+ Left     Lt Pressure (mmHg)IndexWaveform           Comment +---------+------------------+-----+-------------------+-------+ Brachial 201                    triphasic                  +---------+------------------+-----+-------------------+-------+ ATA      97                0.48 dampened monophasic        +---------+------------------+-----+-------------------+-------+ PTA      19                0.09 dampened monophasic        +---------+------------------+-----+-------------------+-------+ Great Toe                       Abnormal                   +---------+------------------+-----+-------------------+-------+ Great toe PPG waveform too dampened to obtain pressures bilaterally.  Summary: Right: ABI is within normal range by ATA pressure, however abnormal pedal artery waveforms suggest this is falsely elevated. ABI is in the moderate range based on PTA pressure. Severely dampened PPG waveform of Great toe. Left: Resting left ankle-brachial index indicates severe left lower extremity arterial disease. Severely dampened PPG waveform of Great toe.  *See table(s) above for measurements and observations.     Preliminary     Scheduled Meds: . amiodarone  200 mg Oral Daily  . atorvastatin  80  mg Oral Daily  . Chlorhexidine Gluconate Cloth  6  each Topical Q0600  . collagenase   Topical Daily  . darbepoetin (ARANESP) injection - DIALYSIS  60 mcg Intravenous Q Tue-HD  . docusate sodium  100 mg Oral QODAY  . doxercalciferol  3 mcg Intravenous Q T,Th,Sa-HD  . ferric citrate  420 mg Oral TID WC  . heparin injection (subcutaneous)  5,000 Units Subcutaneous Q8H  . insulin aspart  0-5 Units Subcutaneous QHS  . insulin aspart  0-9 Units Subcutaneous TID WC  . midodrine  5 mg Oral TID WC  . multivitamin  1 tablet Oral QHS   Continuous Infusions: . ceFEPime (MAXIPIME) IV 1 g (08/20/19 2004)  . vancomycin Stopped (08/20/19 1049)     LOS: 3 days   Time spent: 25 minutes   Barb Merino, MD [facility]  08/21/2019, 1:21 PM

## 2019-08-21 NOTE — Progress Notes (Signed)
Pharmacy Antibiotic Note  Alexis [name] is a 74 y.o. female admitted on 08/18/2019 with r/o osteo.  Pharmacy has been consulted for Vanco/Cefepime dosing.   ID: Heel osteomyelitis. Dr. Sharol Given consulted. Will look at vascular function and limb salvage options  Vanco 4/5>> Cefepime 4/5>>  4/4: BC x 2>> 4/5: MRSA PCR: negative   Plan: -Vanco 1000mg  IV QHD TTS starting 4/6 -Cefepime 1g QHS - ABI's today. - Eliqius on hold    Weight: 95.9 kg (211 lb 6.7 oz)  Temp (24hrs), Avg:98.7 F (37.1 C), Min:98.3 F (36.8 C), Max:99.1 F (37.3 C)  Recent Labs  Lab 08/19/19 0533 08/20/19 0420 08/21/19 0630  WBC 8.0 8.7 8.3  CREATININE 7.53* 9.46* 6.80*    Estimated Creatinine Clearance: 8.6 mL/min (A) (by C-G formula based on SCr of 6.8 mg/dL (H)).    Allergies  Allergen Reactions  . Olmesartan Other (See Comments)    Hyperkalemia     Alexis [name] S. Alford Highland, PharmD, BCPS Clinical Staff Pharmacist [email or web address]  Wayland Salinas 08/21/2019 9:04 AM

## 2019-08-22 ENCOUNTER — Encounter (HOSPITAL_COMMUNITY)
Admission: AD | Disposition: A | Discharge status: Skilled Nursing Facility | Payer: Self-pay | Source: Other Acute Inpatient Hospital | Attending: Internal Medicine

## 2019-08-22 DIAGNOSIS — E11622 Type 2 diabetes mellitus with other skin ulcer: Secondary | ICD-10-CM | POA: Diagnosis not present

## 2019-08-22 DIAGNOSIS — M869 Osteomyelitis, unspecified: Secondary | ICD-10-CM | POA: Diagnosis not present

## 2019-08-22 DIAGNOSIS — L97314 Non-pressure chronic ulcer of right ankle with necrosis of bone: Secondary | ICD-10-CM | POA: Diagnosis not present

## 2019-08-22 DIAGNOSIS — E118 Type 2 diabetes mellitus with unspecified complications: Secondary | ICD-10-CM | POA: Diagnosis not present

## 2019-08-22 HISTORY — PX: PERIPHERAL VASCULAR INTERVENTION: CATH118257

## 2019-08-22 HISTORY — PX: ABDOMINAL AORTOGRAM W/LOWER EXTREMITY: CATH118223

## 2019-08-22 LAB — GLUCOSE, CAPILLARY
Glucose-Capillary: 137 mg/dL — ABNORMAL HIGH (ref 70–99)
Glucose-Capillary: 119 mg/dL — ABNORMAL HIGH (ref 70–99)
Glucose-Capillary: 117 mg/dL — ABNORMAL HIGH (ref 70–99)
Glucose-Capillary: 142 mg/dL — ABNORMAL HIGH (ref 70–99)

## 2019-08-22 LAB — POCT ACTIVATED CLOTTING TIME
Activated Clotting Time: 230 seconds
Activated Clotting Time: 241 seconds
Activated Clotting Time: 208 seconds
Activated Clotting Time: 186 seconds

## 2019-08-22 LAB — SARS CORONAVIRUS 2 (TAT 6-24 HRS): SARS Coronavirus 2: NEGATIVE

## 2019-08-22 SURGERY — ABDOMINAL AORTOGRAM W/LOWER EXTREMITY
Anesthesia: LOCAL

## 2019-08-22 MED ORDER — MIDAZOLAM HCL 2 MG/2ML IJ SOLN
INTRAMUSCULAR | Status: AC
  Filled 2019-08-22: qty 2

## 2019-08-22 MED ORDER — HEPARIN SODIUM (PORCINE) 1000 UNIT/ML IJ SOLN
INTRAMUSCULAR | Status: AC
  Filled 2019-08-22: qty 1

## 2019-08-22 MED ORDER — ASPIRIN 81 MG PO CHEW
CHEWABLE_TABLET | ORAL | Status: AC
  Filled 2019-08-22: qty 1

## 2019-08-22 MED ORDER — FENTANYL CITRATE (PF) 100 MCG/2ML IJ SOLN
INTRAMUSCULAR | Status: AC
  Filled 2019-08-22: qty 2

## 2019-08-22 MED ORDER — HYDRALAZINE HCL 20 MG/ML IJ SOLN: 5.0000 mg | INTRAMUSCULAR | Status: DC | PRN

## 2019-08-22 MED ORDER — SODIUM CHLORIDE 0.9% FLUSH: 3.0000 mL | INTRAVENOUS | Status: DC | PRN

## 2019-08-22 MED ORDER — CLOPIDOGREL BISULFATE 300 MG PO TABS
ORAL_TABLET | ORAL | Status: AC
  Filled 2019-08-22: qty 1

## 2019-08-22 MED ORDER — MIDAZOLAM HCL 2 MG/2ML IJ SOLN
INTRAMUSCULAR | Status: DC | PRN
  Administered 2019-08-22 (×3): 1 mg via INTRAVENOUS

## 2019-08-22 MED ORDER — CLOPIDOGREL BISULFATE 300 MG PO TABS
ORAL_TABLET | ORAL | Status: DC | PRN
  Administered 2019-08-22: 300 mg via ORAL

## 2019-08-22 MED ORDER — LIDOCAINE HCL (PF) 1 % IJ SOLN
INTRAMUSCULAR | Status: DC | PRN
  Administered 2019-08-22: 20 mL
  Administered 2019-08-22: 15 mL

## 2019-08-22 MED ORDER — ACETAMINOPHEN 325 MG PO TABS
650.0000 mg | ORAL_TABLET | ORAL | Status: DC | PRN
  Filled 2019-08-22: qty 2

## 2019-08-22 MED ORDER — SODIUM CHLORIDE 0.9 % IV SOLN: 250.0000 mL | INTRAVENOUS | Status: DC | PRN

## 2019-08-22 MED ORDER — ASPIRIN 81 MG PO CHEW
CHEWABLE_TABLET | ORAL | Status: DC | PRN
  Administered 2019-08-22: 81 mg via ORAL

## 2019-08-22 MED ORDER — SODIUM CHLORIDE 0.9 % IV SOLN: INTRAVENOUS | Status: DC

## 2019-08-22 MED ORDER — HEPARIN (PORCINE) IN NACL 1000-0.9 UT/500ML-% IV SOLN
INTRAVENOUS | Status: DC | PRN
  Administered 2019-08-22: 500 mL

## 2019-08-22 MED ORDER — HEPARIN (PORCINE) IN NACL 1000-0.9 UT/500ML-% IV SOLN
INTRAVENOUS | Status: AC
  Filled 2019-08-22: qty 1000

## 2019-08-22 MED ORDER — HEPARIN SODIUM (PORCINE) 1000 UNIT/ML IJ SOLN
INTRAMUSCULAR | Status: DC | PRN
  Administered 2019-08-22: 10000 [IU] via INTRAVENOUS
  Administered 2019-08-22 (×2): 2000 [IU] via INTRAVENOUS

## 2019-08-22 MED ORDER — ONDANSETRON HCL 4 MG/2ML IJ SOLN: 4.0000 mg | Freq: Four times a day (QID) | INTRAMUSCULAR | Status: DC | PRN

## 2019-08-22 MED ORDER — SODIUM CHLORIDE 0.9% FLUSH
3.0000 mL | Freq: Two times a day (BID) | INTRAVENOUS | Status: DC
  Administered 2019-08-22: 3 mL via INTRAVENOUS

## 2019-08-22 MED ORDER — VANCOMYCIN HCL IN DEXTROSE 1-5 GM/200ML-% IV SOLN
INTRAVENOUS | Status: AC
  Filled 2019-08-22: qty 200

## 2019-08-22 MED ORDER — FENTANYL CITRATE (PF) 100 MCG/2ML IJ SOLN
INTRAMUSCULAR | Status: DC | PRN
  Administered 2019-08-22: 25 ug via INTRAVENOUS
  Administered 2019-08-22: 50 ug via INTRAVENOUS
  Administered 2019-08-22: 25 ug via INTRAVENOUS

## 2019-08-22 MED ORDER — DOXERCALCIFEROL 4 MCG/2ML IV SOLN
INTRAVENOUS | Status: AC
  Filled 2019-08-22: qty 2

## 2019-08-22 MED ORDER — IODIXANOL 320 MG/ML IV SOLN
INTRAVENOUS | Status: DC | PRN
  Administered 2019-08-22: 180 mL via INTRA_ARTERIAL

## 2019-08-22 MED ORDER — ASPIRIN EC 81 MG PO TBEC
81.0000 mg | DELAYED_RELEASE_TABLET | Freq: Every day | ORAL | Status: DC
  Administered 2019-08-24 – 2019-08-31 (×7): 81 mg via ORAL
  Filled 2019-08-22 (×9): qty 1

## 2019-08-22 MED ORDER — LIDOCAINE HCL (PF) 1 % IJ SOLN
INTRAMUSCULAR | Status: AC
  Filled 2019-08-22: qty 30

## 2019-08-22 MED ORDER — HEPARIN (PORCINE) IN NACL 2000-0.9 UNIT/L-% IV SOLN
INTRAVENOUS | Status: AC
  Filled 2019-08-22: qty 1000

## 2019-08-22 MED ORDER — CLOPIDOGREL BISULFATE 75 MG PO TABS
75.0000 mg | ORAL_TABLET | Freq: Every day | ORAL | Status: DC
  Administered 2019-08-24 – 2019-08-31 (×7): 75 mg via ORAL
  Filled 2019-08-22 (×7): qty 1

## 2019-08-22 MED ORDER — LABETALOL HCL 5 MG/ML IV SOLN: 10.0000 mg | INTRAVENOUS | Status: DC | PRN

## 2019-08-22 SURGICAL SUPPLY — 30 items
BAG SNAP BAND KOVER 36X36 (MISCELLANEOUS) ×2 IMPLANT
BALLN MUSTANG 4X100X135 (BALLOONS) ×3
BALLN MUSTANG 5X200X135 (BALLOONS) ×3
BALLOON MUSTANG 4X100X135 (BALLOONS) IMPLANT
BALLOON MUSTANG 5X200X135 (BALLOONS) IMPLANT
CATH NAVICROSS ST .035X135CM (MICROCATHETER) ×1 IMPLANT
CATH OMNI FLUSH 5F 65CM (CATHETERS) ×1 IMPLANT
CATH QUICKCROSS SUPP .035X90CM (MICROCATHETER) ×1 IMPLANT
CATH SHOCKWAVE 5.0X60 (CATHETERS) ×1 IMPLANT
CATH SHOCKWAVE 6.0X60 (CATHETERS) ×1 IMPLANT
COVER DOME SNAP 22 D (MISCELLANEOUS) ×2 IMPLANT
DEVICE TORQUE H2O (MISCELLANEOUS) ×1 IMPLANT
GLIDEWIRE ADV .035X260CM (WIRE) ×1 IMPLANT
GUIDEWIRE ANGLED .035X150CM (WIRE) ×1 IMPLANT
KIT ENCORE 26 ADVANTAGE (KITS) ×1 IMPLANT
KIT MICROPUNCTURE NIT STIFF (SHEATH) ×1 IMPLANT
KIT PV (KITS) ×3 IMPLANT
SHEATH PINNACLE 5F 10CM (SHEATH) ×1 IMPLANT
SHEATH PINNACLE 7F 10CM (SHEATH) ×1 IMPLANT
SHEATH PINNACLE MP 7F 45CM (SHEATH) ×1 IMPLANT
SHEATH PROBE COVER 6X72 (BAG) ×3 IMPLANT
SHIELD RADPAD SCOOP 12X17 (MISCELLANEOUS) ×1 IMPLANT
STENT ELUVIA 6X120X130 (Permanent Stent) ×1 IMPLANT
STENT ELUVIA 7X120X130 (Permanent Stent) ×2 IMPLANT
STENT ELUVIA 7X40X130 (Permanent Stent) ×1 IMPLANT
STENT TIGRIS 5X100X120 (Permanent Stent) ×1 IMPLANT
TRAY PV CATH (CUSTOM PROCEDURE TRAY) ×3 IMPLANT
WIRE BENTSON .035X145CM (WIRE) ×2 IMPLANT
WIRE SPARTACORE .014X300CM (WIRE) ×1 IMPLANT
WIRE TORQFLEX AUST .018X40CM (WIRE) ×1 IMPLANT

## 2019-08-22 NOTE — Progress Notes (Signed)
[facility] Progress Note   Subjective:  Underwent aortogram w lithotripsy/stent RLE this am. Seen in room, asking where she is.    Objective Vitals:   08/22/19 1540 08/22/19 1545 08/22/19 1550 08/22/19 1555  BP: (!) 134/45 (!) 114/95 (!) 141/46   Pulse: (!) 50 75 79 77  Resp: 16 17 17 18   Temp:      TempSrc:      SpO2: 98% 100% 100% 100%  Weight:         Additional Objective Labs: Basic Metabolic Panel: Recent Labs  Lab 08/19/19 0533 08/20/19 0420 08/21/19 0630  NA 139 135 136  K 3.5 3.7 3.7  CL 97* 94* 95*  CO2 27 24 26   GLUCOSE 113* 114* 132*  BUN 38* 50* 28*  CREATININE 7.53* 9.46* 6.80*  CALCIUM 8.0* 8.3* 8.6*   CBC: Recent Labs  Lab 08/19/19 0533 08/20/19 0420 08/21/19 0630  WBC 8.0 8.7 8.3  NEUTROABS  --  5.9 5.8  HGB 8.8* 9.2* 9.6*  HCT 27.9* 28.9* 30.1*  MCV 78.6* 78.1* 78.2*  PLT 218 227 224   Blood Culture    Component Value Date/Time   SDES BLOOD LEFT HAND 08/18/2019 2017   SPECREQUEST  08/18/2019 2017    BOTTLES DRAWN AEROBIC AND ANAEROBIC Blood Culture adequate volume   CULT  08/18/2019 2017    NO GROWTH 4 DAYS Performed at [facility], [place] [address] [place], [place] [address]    REPTSTATUS PENDING 08/18/2019 2017     Physical Exam General: Alert, well appearing, nad  Heart: RRR  Lungs: clear bilaterally  Abdomen: soft non-tender Extremities: no sig LE edema; feet in booties  Dialysis Access: RUE AVG +bruit   Medications: . sodium chloride 10 mL/hr at 08/22/19 0906  . sodium chloride    . [MAR Hold] ceFEPime (MAXIPIME) IV 1 g (08/21/19 2130)  . [MAR Hold] vancomycin Stopped (08/20/19 1049)   . [MAR Hold] amiodarone  200 mg Oral Daily  . [START ON 08/23/2019] aspirin EC  81 mg Oral Daily  . [MAR Hold] atorvastatin  80 mg Oral Daily  . [START ON 08/23/2019] clopidogrel  75 mg Oral Q breakfast  . [MAR Hold] collagenase   Topical Daily  . [MAR Hold] darbepoetin (ARANESP) injection - DIALYSIS  60 mcg  Intravenous Q Tue-HD  . [MAR Hold] docusate sodium  100 mg Oral QODAY  . [MAR Hold] doxercalciferol  3 mcg Intravenous Q T,Th,Sa-HD  . [MAR Hold] ferric citrate  420 mg Oral TID WC  . [MAR Hold] heparin injection (subcutaneous)  5,000 Units Subcutaneous Q8H  . [MAR Hold] insulin aspart  0-5 Units Subcutaneous QHS  . [MAR Hold] insulin aspart  0-9 Units Subcutaneous TID WC  . [MAR Hold] midodrine  5 mg Oral TID WC  . [MAR Hold] multivitamin  1 tablet Oral QHS  . sodium chloride flush  3 mL Intravenous Q12H    Dialysis Orders:  [facility] TTS 4.25 hr 400/600 96 kg 2.0 K/2.5 Ca R AVG -Heparin 2000 units IV initial bolus then 1000 units/hr. Stop 1 hour before end of treatment -Epogen 5400 units IV TIW -Hectorol 2.5 mcg IV TIW -Venofer 50 mg IV weekly  Assessment/Plan: 1.  PAD/Bilateral foot ulcers/osteomyelitis. BCx NGTD. On vanc. S/p aortogram w lithotripsy/stent RLE per [facility].  2.  ESRD -  HD TTS.  Orders for HD today after vacular procedure.  3.  Hypertension/volume  -BP ok. On Midodrine for BP support. No evidence of volume  excess. Is under OP EDW UF as tolerated.  4.  Anemia  -HGB 9.6  Epogen not on formulary.  Aranesp 60 mcg IV with HD 4/6.  5.  Metabolic bone disease - Ca ok.  Calcitriol and hectorol both on OP med list. .  DC oral calcitriol. Continue Auryxia 210 mg PO TID AC 1 tab with snack.  6.  Nutrition - Renal/Carb mod diet.  7.  DM- per primary.    Lynnda Child PA-C [facility] Pager [phone] 08/22/2019,3:59 PM

## 2019-08-22 NOTE — Progress Notes (Signed)
Patient arrived to 4 E room 02 room at this time. Telemetry applied and CCMD notified. CHG bath done. Bilateral groin clean dry and intact. Level 0. V/s and assessment complete. BG 117. Patient oriented to room and how to call nurse with any needs.

## 2019-08-22 NOTE — Progress Notes (Signed)
Left groin arterial sheath removed by: Evlyn Kanner RN Left groin level 0 pre & post sheath pull - manual pressure held 25 minutes. Vital signs stable - bed rest started at 15:50 for 4 hours.  Clean dry dressing applied - DP dopplered.

## 2019-08-22 NOTE — Interval H&P Note (Signed)
History and Physical Interval Note:  08/22/2019 9:02 AM  Alexis [name]  has presented today for surgery, with the diagnosis of claudication.  The various methods of treatment have been discussed with the patient and family. After consideration of risks, benefits and other options for treatment, the patient has consented to  Procedure(s): ABDOMINAL AORTOGRAM W/LOWER EXTREMITY (N/A) as a surgical intervention.  The patient's history has been reviewed, patient examined, no change in status, stable for surgery.  I have reviewed the patient's chart and labs.  Questions were answered to the patient's satisfaction.     [name]

## 2019-08-22 NOTE — Progress Notes (Signed)
PROGRESS NOTE    Alexis [name]  EXH:[id number] DOB: [birth year] DOA: 08/18/2019 PCP: System, Provider Not In   Brief Narrative:  HPI: Alexis [name] is a 74 y.o. female with medical history significant of A. fib on Eliquis, ESRD on HD TTS, CAD, pulmonary hypertension, LBBB, hypertension, hyperlipidemia, CHF, type 2 diabetes, diabetic neuropathy, CVA presented to [facility] ED with complaints of bilateral heel ulcers.  Diagnosed with osteomyelitis of the right fifth metatarsal head and transferred to [facility] for further management as she is a dialysis patient.  Patient reports 1 month history of bilateral heel ulcers and pain in her feet.  Denies fevers or chills.  States she vomited once after eating breakfast today but no further episodes of vomiting or nausea since then.  Denies abdominal pain or diarrhea.    ED Course: Afebrile and hemodynamically stable CBC: Hemoglobin 9.3, hematocrit 29.5, WBC 7.5, platelet 381 Metabolic panel: Sodium 017, potassium 3.3, chloride 96, bicarb 26, BUN 31, creatinine 6.1, glucose 149 Lactate 1.5 SARS-CoV-2 test negative, respiratory viral panel negative Blood cultures negative so far.   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  Followed by orthopedics and vascular surgery.  Underwent revascularization 08/22/2019 , " Intravascular lithotripsy, right superficial femoral and popliteal artery and Drug-eluting stent, right superficial  femoral-popliteal artery" Continue to hold anticoagulation for anticipated surgical procedure.  Further management per orthopedics if planning any debridements.  Paroxysmal atrial fibrillation: Hold Eliquis at this time.  Will likely need surgery given osteomyelitis, rate controlled.  Continue home dose of amiodarone.  Monitor on telemetry.  ESRD on HD: Receiving dialysis on a schedule as per nephrology.  Insulin-dependent type 2 diabetes: Takes 70/30 insulin at home.  Blood sugars well controlled currently on sliding scale insulin.  Anemia of chronic disease.  Hemoglobin stable.   DVT prophylaxis:  heparin DVT prophylaxis dose   Code Status: Full Code  Family Communication: Patient. Patient is from: Home Disposition Plan: Patient from home.  Discharge plan Home, may need home health. Barriers to discharge: Pending consultation and further surgical intervention for osteomyelitis by orthopedics   Estimated body mass index is 34.02 kg/m as calculated from the following:   Height as of 07/22/19: 5\' 6"  (1.676 m).   Weight as of this encounter: 95.6 kg.  Pressure Injury 08/18/19 Buttocks Left Stage 2 -  Partial thickness loss of dermis presenting as a shallow open injury with a red, pink wound bed without slough. (Active)  08/18/19 1930  Location: Buttocks  Location Orientation: Left  Staging: Stage 2 -  Partial thickness loss of dermis presenting as a shallow open injury with a red, pink wound bed without slough.  Wound Description (Comments):   Present on Admission: Yes     Nutritional status:               Consultants:   Orthopedics  Vascular surgery  Procedures:   None  Antimicrobials:  Anti-infectives (From admission, onward)   Start     Dose/Rate Route Frequency Ordered Stop   08/20/19 1200  vancomycin (VANCOCIN) IVPB 1000 mg/200 mL premix     1,000 mg 200 mL/hr over 60 Minutes Intravenous Every T-Th-Sa (Hemodialysis) 08/19/19 1501     08/19/19 2000   ceFEPIme (MAXIPIME) 1 g in sodium chloride 0.9 % 100 mL IVPB     1 g 200 mL/hr over 30 Minutes Intravenous Every 24 hours 08/18/19 2000     08/18/19 2015  ceFEPIme (MAXIPIME) 2 g in sodium chloride 0.9 % 100 mL IVPB     2 g 200 mL/hr over 30 Minutes Intravenous  Once 08/18/19 2000 08/18/19 2139   08/18/19 2015  vancomycin (VANCOREADY) IVPB 500 mg/100 mL     500 mg 100 mL/hr over 60 Minutes Intravenous  Once 08/18/19 2000 08/18/19 2339   08/18/19 2000  vancomycin variable dose per unstable renal function (pharmacist dosing)  Status:  Discontinued      Does not apply See admin instructions 08/18/19 2000 08/19/19 1501         Subjective: Patient seen and examined.  No overnight events.  Came back from surgery. Hungry now. Denies any pain, nausea,   Objective: Vitals:   08/22/19 1545 08/22/19 1550 08/22/19 1555 08/22/19 1613  BP: (!) 114/95 (!) 141/46    Pulse: 75 79 77   Resp: 17 17 18    Temp:    98 F (36.7 C)  TempSrc:    Oral  SpO2: 100% 100% 100%   Weight:    95.6 kg    Intake/Output Summary (Last 24 hours) at 08/22/2019 1644 Last data filed at 08/22/2019 0600 Gross per 24 hour  Intake 765.4 ml  Output 0 ml  Net 765.4 ml   Filed Weights   08/20/19 2054 08/21/19 2123 08/22/19 1613  Weight: 95.9 kg 95.6 kg 95.6 kg    Examination:  General exam: Appears calm and comfortable , chronically sick looking. Respiratory system: Clear to auscultation. Respiratory effort normal. Cardiovascular system: S1 & S2 heard, irregularly irregular rate and rhythm. No JVD, murmurs, rubs, gallops or clicks. No pedal edema. Gastrointestinal system: Abdomen is nondistended, soft and nontender. No organomegaly or masses felt. Normal bowel sounds heard. Central nervous system: Alert and oriented. No focal neurological deficits. Extremities: Symmetric 5 x 5 power.  Necrotic head of the second and third right toe.  Missing right fifth toe.  Open ulcer calcaneum with some drainage.  Groin is  clean and dry. Psychiatry: Judgement and insight appear poor.  Mood & affect flat.  Data Reviewed: I have personally reviewed following labs and imaging studies  CBC: Recent Labs  Lab 08/19/19 0533 08/20/19 0420 08/21/19 0630  WBC 8.0 8.7 8.3  NEUTROABS  --  5.9 5.8  HGB 8.8* 9.2* 9.6*  HCT 27.9* 28.9* 30.1*  MCV 78.6* 78.1* 78.2*  PLT 218 227 518   Basic Metabolic Panel: Recent Labs  Lab 08/19/19 0533 08/20/19 0420 08/21/19 0630  NA 139 135 136  K 3.5 3.7 3.7  CL 97* 94* 95*  CO2 27 24 26   GLUCOSE 113* 114* 132*  BUN 38* 50* 28*  CREATININE 7.53* 9.46* 6.80*  CALCIUM 8.0* 8.3* 8.6*   GFR: Estimated Creatinine Clearance: 8.6 mL/min (A) (by C-G formula based on SCr of 6.8 mg/dL (H)). Liver Function Tests: Recent Labs  Lab 08/20/19 0420 08/21/19 0630  AST 12* 12*  ALT 10 6  ALKPHOS 48 49  BILITOT 0.8 0.6  PROT 7.7 7.8  ALBUMIN 2.9* 2.8*   No results for input(s): LIPASE, AMYLASE in the last 168 hours. No results  for input(s): AMMONIA in the last 168 hours. Coagulation Profile: No results for input(s): INR, PROTIME in the last 168 hours. Cardiac Enzymes: No results for input(s): CKTOTAL, CKMB, CKMBINDEX, TROPONINI in the last 168 hours. BNP (last 3 results) No results for input(s): PROBNP in the last 8760 hours. HbA1C: No results for input(s): HGBA1C in the last 72 hours. CBG: Recent Labs  Lab 08/21/19 1634 08/21/19 2120 08/22/19 0707 08/22/19 1330 08/22/19 1620  GLUCAP 147* 153* 137* 119* 117*   Lipid Profile: No results for input(s): CHOL, HDL, LDLCALC, TRIG, CHOLHDL, LDLDIRECT in the last 72 hours. Thyroid Function Tests: No results for input(s): TSH, T4TOTAL, FREET4, T3FREE, THYROIDAB in the last 72 hours. Anemia Panel: No results for input(s): VITAMINB12, FOLATE, FERRITIN, TIBC, IRON, RETICCTPCT in the last 72 hours. Sepsis Labs: No results for input(s): PROCALCITON, LATICACIDVEN in the last 168 hours.  Recent Results (from the past 240  hour(s))  Culture, blood (routine x 2)     Status: None (Preliminary result)   Collection Time: 08/18/19  8:09 PM   Specimen: BLOOD LEFT HAND  Result Value Ref Range Status   Specimen Description BLOOD LEFT HAND  Final   Special Requests   Final    BOTTLES DRAWN AEROBIC AND ANAEROBIC Blood Culture adequate volume   Culture   Final    NO GROWTH 4 DAYS Performed at [facility], [address] [place], [place] [address]    Report Status PENDING  Incomplete  Culture, blood (routine x 2)     Status: None (Preliminary result)   Collection Time: 08/18/19  8:17 PM   Specimen: BLOOD LEFT HAND  Result Value Ref Range Status   Specimen Description BLOOD LEFT HAND  Final   Special Requests   Final    BOTTLES DRAWN AEROBIC AND ANAEROBIC Blood Culture adequate volume   Culture   Final    NO GROWTH 4 DAYS Performed at [facility], [place] [address], [place], [place] [address]    Report Status PENDING  Incomplete  MRSA PCR Screening     Status: None   Collection Time: 08/19/19  8:23 AM   Specimen: Nasal Mucosa; Nasopharyngeal  Result Value Ref Range Status   MRSA by PCR NEGATIVE NEGATIVE Final    Comment:        The GeneXpert MRSA Assay (FDA approved for NASAL specimens only), is one component of a comprehensive MRSA colonization surveillance program. It is not intended to diagnose MRSA infection nor to guide or monitor treatment for MRSA infections. Performed at [facility], [place] [address] [place], [place] [address]   SARS CORONAVIRUS 2 (TAT 6-24 HRS) Nasopharyngeal Nasopharyngeal Swab     Status: None   Collection Time: 08/22/19  4:25 AM   Specimen: Nasopharyngeal Swab  Result Value Ref Range Status   SARS Coronavirus 2 NEGATIVE NEGATIVE Final    Comment: (NOTE) SARS-CoV-2 target nucleic acids are NOT DETECTED. The SARS-CoV-2 RNA is generally detectable in upper and lower respiratory specimens during the acute phase of infection. Negative results do not  preclude SARS-CoV-2 infection, do not rule out co-infections with other pathogens, and should not be used as the sole basis for treatment or other patient management decisions. Negative results must be combined with clinical observations, patient history, and epidemiological information. The expected result is Negative. Fact Sheet for Patients: SugarRoll.be Fact Sheet for Healthcare Providers: https://www.woods-mathews.com/ This test is not yet approved or cleared by the [place] FDA and  has  been authorized for detection and/or diagnosis of SARS-CoV-2 by FDA under an Emergency Use Authorization (EUA). This EUA will remain  in effect (meaning this test can be used) for the duration of the COVID-19 declaration under Section 56 4(b)(1) of the Act, 21 U.S.C. section 360bbb-3(b)(1), unless the authorization is terminated or revoked sooner. Performed at [facility], [place] [address] [place], [place] [address]       Radiology Studies: PERIPHERAL VASCULAR CATHETERIZATION  Result Date: 08/22/2019 Patient name: Alexis [name] MRN: [id number] DOB: Jan 04, 1946 Sex: female 08/22/2019 Pre-operative Diagnosis: Bilateral lower extremity ulcer Post-operative diagnosis:  Same Surgeon:  [name] Procedure Performed:  1.  Ultrasound-guided access, right femoral artery  2.  Ultrasound-guided access, left femoral artery  3.  Abdominal aortogram  4.  Bilateral lower extremity runoff  5.  Intravascular lithotripsy, right superficial femoral and popliteal artery  6.  Drug-eluting stent, right superficial femoral-popliteal artery  7.  Conscious sedation, 143 minutes Indications: The patient has extensive bilateral lower extremity ulcers.  She is here today for limb salvage. Procedure:  The patient was identified in the holding area and taken to room 8.  The patient was then placed supine on the table and prepped and draped in the usual sterile fashion.  A time out  was called.  Conscious sedation was administered with the use of IV fentanyl and Versed under continuous physician and nurse monitoring.  Heart rate, blood pressure, and oxygen saturation were continuously monitored.  Total sedation time was 143 minutes.  I initially tried to get access into the left common femoral artery, however I was unsuccessful therefore I turned my attention towards the right femoral artery.  Ultrasound was used to evaluate the right common femoral artery.  It was patent .  A digital ultrasound image was acquired.  A micropuncture needle was used to access the right common femoral artery under ultrasound guidance.  An 018 wire was advanced without resistance and a micropuncture sheath was placed.  The 018 wire was removed and a benson wire was placed.  The micropuncture sheath was exchanged for a 5 french sheath.  An omniflush catheter was advanced over the wire to the level of L-1.  An abdominal angiogram was obtained.  Next, the cath was pulled out of the aortic bifurcation and bilateral runoff was performed.  I then followed up for the intervention by gaining access under ultrasound guidance of the left common femoral artery. Findings:  Aortogram: No significant infrarenal aortic stenosis.  Bilateral common and external iliac arteries are patent throughout the course however they are heavily calcified.   Right Lower Extremity:  The right common femoral and profundofemoral artery are heavily calcified but patent throughout their course.  The superficial femoral artery is diffusely diseased with multiple high-grade lesions down to the adductor canal where it occludes.  There is reconstitution of the popliteal artery just below the joint space.  There is two-vessel runoff via the peroneal and posterior tibial artery.  Left Lower Extremity: The common femoral and profundofemoral artery are patent without stenosis but heavily calcified.  The superficial femoral artery is occluded with  reconstitution of the superficial femoral artery at the adductor canal with diffusely diseased three-vessel runoff and minimal opacification of the digital arteries. Intervention: After the above images were acquired the decision made to proceed with intervention.  The sheath in the right groin was removed.  I tried to use a minx however this failed because the balloon popped.  Manual pressure was  held for 10 minutes.  I then placed a 7 x 45 sheath from the left groin into the right external iliac artery and fully heparinized the patient.  Using a quick cross catheter and 035 Glidewire followed by a Glidewire advantage I was able to get across the stenosis.  The lesion was predilated with a 4 x 100 balloon.  I then performed shockwave intravascular ultrasound of the popliteal artery just distal to the joint space up to its origin.  A 5 x 60 balloon was used for the distal half and a 6 x 60 balloon was used for the proximal half.  Total treatment time was 510 seconds.  I then elected to stent the treated area.  A 5 x 100 [place] stent was placed distally followed by Elluvia 6 x 120, 7 x 120, and 7 x 40.  The stents were then molded with a 5 mm balloon and completion imaging showed inline flow down across the foot.  Catheters and wires were removed.  A short 7 French sheath was placed.  Patient tolerated the procedure well there were no complications. 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  Successfully recanalized and treated with shockwave intravascular lithotripsy followed by drug-eluting stent placement from just below the joint space in the popliteal artery up to the origin of the superficial femoral artery.  There is two-vessel runoff via the peroneal and posterior tibial artery.  #2  Left superficial femoral artery occlusion  V. [name], M.D., [facility] [place] Office: [phone] Pager:  [phone]  VAS [place] ABI WITH/WO  TBI  Result Date: 08/21/2019 LOWER EXTREMITY DOPPLER STUDY Indications: Gangrene, and peripheral artery disease. High Risk Factors: Diabetes.  Performing Technologist: June Leap Rvt, Rdms  Examination Guidelines: A complete evaluation includes at minimum, Doppler waveform signals and systolic blood pressure reading at the level of bilateral brachial, anterior tibial, and posterior tibial arteries, when vessel segments are accessible. Bilateral testing is considered an integral part of a complete examination. Photoelectric Plethysmograph (PPG) waveforms and toe systolic pressure readings are included as required and additional duplex testing as needed. Limited examinations for reoccurring indications may be performed as noted.  ABI Findings: +---------+-----------------+-----+------------------+-------------------------+  Right     Rt Pressure       Index Waveform           Comment                               (mmHg)                                                                +---------+-----------------+-----+------------------+-------------------------+  Brachial                                             BP not taken-restricted  arm                        +---------+-----------------+-----+------------------+-------------------------+  ATA       206               1.02  dampened                                                                         monophasic                                    +---------+-----------------+-----+------------------+-------------------------+  PTA       147               0.73  dampened                                                                         monophasic                                    +---------+-----------------+-----+------------------+-------------------------+  Great Toe                         Abnormal                                       +---------+-----------------+-----+------------------+-------------------------+ +---------+------------------+-----+-------------------+-------+  Left      Lt Pressure (mmHg) Index Waveform            Comment  +---------+------------------+-----+-------------------+-------+  Brachial  201                      triphasic                    +---------+------------------+-----+-------------------+-------+  ATA       97                 0.48  dampened monophasic          +---------+------------------+-----+-------------------+-------+  PTA       19                 0.09  dampened monophasic          +---------+------------------+-----+-------------------+-------+  Great Toe                          Abnormal                     +---------+------------------+-----+-------------------+-------+ Great toe PPG waveform too dampened to obtain pressures bilaterally.  Summary: Right: ABI is within normal range by ATA pressure, however abnormal pedal artery waveforms suggest this is falsely elevated. ABI is in the moderate range  based on PTA pressure. Severely dampened PPG waveform of Great toe. Left: Resting left ankle-brachial index indicates severe left lower extremity arterial disease. Severely dampened PPG waveform of Great toe.  *See table(s) above for measurements and observations.  Electronically signed by Servando Snare MD on 08/21/2019 at 5:13:35 PM.    Final     Scheduled Meds:  amiodarone  200 mg Oral Daily   [START ON 08/23/2019] aspirin EC  81 mg Oral Daily   atorvastatin  80 mg Oral Daily   [START ON 08/23/2019] clopidogrel  75 mg Oral Q breakfast   collagenase   Topical Daily   darbepoetin (ARANESP) injection - DIALYSIS  60 mcg Intravenous Q Tue-HD   docusate sodium  100 mg Oral QODAY   doxercalciferol  3 mcg Intravenous Q T,Th,Sa-HD   ferric citrate  420 mg Oral TID WC   heparin injection (subcutaneous)  5,000 Units Subcutaneous Q8H   insulin aspart  0-5 Units Subcutaneous QHS   insulin aspart   0-9 Units Subcutaneous TID WC   midodrine  5 mg Oral TID WC   multivitamin  1 tablet Oral QHS   sodium chloride flush  3 mL Intravenous Q12H   Continuous Infusions:  sodium chloride 10 mL/hr at 08/22/19 0906   sodium chloride     ceFEPime (MAXIPIME) IV 1 g (08/21/19 2130)   vancomycin Stopped (08/20/19 1049)     LOS: 4 days   Time spent: 25 minutes   Barb Merino, MD [facility]  08/22/2019, 4:44 PM

## 2019-08-22 NOTE — Op Note (Signed)
Patient name: Alexis [name] MRN: [id number] DOB: 01/12/1946 Sex: female  08/22/2019 Pre-operative Diagnosis: Bilateral lower extremity ulcer Post-operative diagnosis:  Same Surgeon:  [name] Procedure Performed:  1.  Ultrasound-guided access, right femoral artery  2.  Ultrasound-guided access, left femoral artery  3.  Abdominal aortogram  4.  Bilateral lower extremity runoff  5.  Intravascular lithotripsy, right superficial femoral and popliteal artery  6.  Drug-eluting stent, right superficial femoral-popliteal artery  7.  Conscious sedation, 143 minutes   Indications: The patient has extensive bilateral lower extremity ulcers.  She is here today for limb salvage.  Procedure:  The patient was identified in the holding area and taken to room 8.  The patient was then placed supine on the table and prepped and draped in the usual sterile fashion.  A time out was called.  Conscious sedation was administered with the use of IV fentanyl and Versed under continuous physician and nurse monitoring.  Heart rate, blood pressure, and oxygen saturation were continuously monitored.  Total sedation time was 143 minutes.  I initially tried to get access into the left common femoral artery, however I was unsuccessful therefore I turned my attention towards the right femoral artery.  Ultrasound was used to evaluate the right common femoral artery.  It was patent .  A digital ultrasound image was acquired.  A micropuncture needle was used to access the right common femoral artery under ultrasound guidance.  An 018 wire was advanced without resistance and a micropuncture sheath was placed.  The 018 wire was removed and a benson wire was placed.  The micropuncture sheath was exchanged for a 5 french sheath.  An omniflush catheter was advanced over the wire to the level of L-1.  An abdominal angiogram was obtained.  Next, the cath was pulled out of the aortic bifurcation and bilateral runoff was performed.  I  then followed up for the intervention by gaining access under ultrasound guidance of the left common femoral artery.  Findings:   Aortogram: No significant infrarenal aortic stenosis.  Bilateral common and external iliac arteries are patent throughout the course however they are heavily calcified.    Right Lower Extremity:  The right common femoral and profundofemoral artery are heavily calcified but patent throughout their course.  The superficial femoral artery is diffusely diseased with multiple high-grade lesions down to the adductor canal where it occludes.  There is reconstitution of the popliteal artery just below the joint space.  There is two-vessel runoff via the peroneal and posterior tibial artery.  Left Lower Extremity: The common femoral and profundofemoral artery are patent without stenosis but heavily calcified.  The superficial femoral artery is occluded with reconstitution of the superficial femoral artery at the adductor canal with diffusely diseased three-vessel runoff and minimal opacification of the digital arteries.  Intervention: After the above images were acquired the decision made to proceed with intervention.  The sheath in the right groin was removed.  I tried to use a minx however this failed because the balloon popped.  Manual pressure was held for 10 minutes.  I then placed a 7 x 45 sheath from the left groin into the right external iliac artery and fully heparinized the patient.  Using a quick cross catheter and 035 Glidewire followed by a Glidewire advantage I was able to get across the stenosis.  The lesion was predilated with a 4 x 100 balloon.  I then performed shockwave intravascular ultrasound of the popliteal artery just distal  to the joint space up to its origin.  A 5 x 60 balloon was used for the distal half and a 6 x 60 balloon was used for the proximal half.  Total treatment time was 510 seconds.  I then elected to stent the treated area.  A 5 x 100 Tigris stent was  placed distally followed by Elluvia 6 x 120, 7 x 120, and 7 x 40.  The stents were then molded with a 5 mm balloon and completion imaging showed inline flow down across the foot.  Catheters and wires were removed.  A short 7 French sheath was placed.  Patient tolerated the procedure well there were no complications.  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  Successfully recanalized and treated with shockwave intravascular lithotripsy followed by drug-eluting stent placement from just below the joint space in the popliteal artery up to the origin of the superficial femoral artery.  There is two-vessel runoff via the peroneal and posterior tibial artery.  #2  Left superficial femoral artery occlusion    V. [name], M.D., [facility] [place] Office: [phone] Pager:  [phone]

## 2019-08-23 DIAGNOSIS — M86172 Other acute osteomyelitis, left ankle and foot: Secondary | ICD-10-CM

## 2019-08-23 DIAGNOSIS — Z992 Dependence on renal dialysis: Secondary | ICD-10-CM

## 2019-08-23 DIAGNOSIS — M869 Osteomyelitis, unspecified: Secondary | ICD-10-CM | POA: Diagnosis not present

## 2019-08-23 DIAGNOSIS — I739 Peripheral vascular disease, unspecified: Secondary | ICD-10-CM

## 2019-08-23 DIAGNOSIS — N186 End stage renal disease: Secondary | ICD-10-CM

## 2019-08-23 DIAGNOSIS — E11621 Type 2 diabetes mellitus with foot ulcer: Secondary | ICD-10-CM | POA: Diagnosis not present

## 2019-08-23 DIAGNOSIS — E11622 Type 2 diabetes mellitus with other skin ulcer: Secondary | ICD-10-CM | POA: Diagnosis not present

## 2019-08-23 DIAGNOSIS — L97314 Non-pressure chronic ulcer of right ankle with necrosis of bone: Secondary | ICD-10-CM | POA: Diagnosis not present

## 2019-08-23 LAB — CULTURE, BLOOD (ROUTINE X 2)
Culture: NO GROWTH
Culture: NO GROWTH
Special Requests: ADEQUATE
Special Requests: ADEQUATE

## 2019-08-23 LAB — GLUCOSE, CAPILLARY
Glucose-Capillary: 115 mg/dL — ABNORMAL HIGH (ref 70–99)
Glucose-Capillary: 125 mg/dL — ABNORMAL HIGH (ref 70–99)
Glucose-Capillary: 172 mg/dL — ABNORMAL HIGH (ref 70–99)
Glucose-Capillary: 120 mg/dL — ABNORMAL HIGH (ref 70–99)

## 2019-08-23 MED ORDER — MORPHINE SULFATE (PF) 2 MG/ML IV SOLN: 1.0000 mg | Freq: Once | INTRAVENOUS | Status: DC

## 2019-08-23 MED ORDER — COLLAGENASE 250 UNIT/GM EX OINT
TOPICAL_OINTMENT | Freq: Every day | CUTANEOUS | Status: DC
  Filled 2019-08-23: qty 30

## 2019-08-23 NOTE — H&P (View-Only) (Signed)
Patient ID: Alexis [name], female   DOB: 27-Aug-1945, 74 y.o.   MRN: [id number] Patient is status post revascularization to the right lower extremity.  Patient has no inline flow below the knee on the left.  I discussed with the patient these findings and I recommended that she proceed with above-knee amputation on the left.  I tried to contact family while at bedside but there was no answer.  Patient will need to proceed with a left above-the-knee amputation once patient and family agreed to proceed with surgery.  I will place orders for Santyl dressing changes to the lateral malleolus right ankle.  Anticipate this should heal with wound care.

## 2019-08-23 NOTE — Care Management Important Message (Signed)
Important Message  Patient Details  Name: Alexis [name] MRN: [id number] Date of Birth: 01-May-1946   Medicare Important Message Given:  Yes     Shelda Altes 08/23/2019, 9:38 AM

## 2019-08-23 NOTE — Progress Notes (Signed)
Subjective:  reports some feet discomfort/ improved since VVS procedure    Objective Vital signs in last 24 hours: Vitals:   08/23/19 0152 08/23/19 0418 08/23/19 0800 08/23/19 1401  BP: (!) 110/39 (!) 111/49 (!) 153/125 (!) 94/55  Pulse: 79 83 88 83  Resp: 16 16 18  (!) 21  Temp: 98 F (36.7 C) 99.4 F (37.4 C) 99.1 F (37.3 C) 99.5 F (37.5 C)  TempSrc: Oral Oral Oral Oral  SpO2: 100% 96% 98% 100%  Weight: 100.7 kg      Weight change: 0 kg  Physical Exam: General: Alert, nad  Heart: RRR  Lungs: clear bilaterally  Abdomen: soft non-tender Extremities: no sig LE edema; feet in booties  Dialysis Access: RUE AVG +bruit    OP Dialysis Orders: [facility] 4.25 hr 400/600 96 kg 2.0 K/2.5 Ca R AVG -Heparin 2000 units IV initial bolus then 1000 units/hr. Stop 1 hour before end of treatment -Epogen 5400 units IV TIW -Hectorol 2.5 mcg IV TIW -Venofer 50 mg IV weekly  Problem/Plan: 1.  PAD/Bilateral foot ulcers/osteomyelitis. BCx NGTD. On vanc./ Maxipime  S/p aortogram w lithotripsy/stent RLE per VVS.  2. ESRD - HD TTS.   3. Hypertension/volume -BP ok. On Midodrine for BP support. No evidence of volume excess. Is under OP EDW UF as tolerated.  4. Anemia -HGB 9.6  Epogen not on formulary.  Aranesp 60 mcg IV with HD 4/6.  5. Metabolic bone disease - Ca ok.  Calcitriol and hectorol both on OP med list. .  DC oral calcitriol. Continue Auryxia 210 mg PO TID AC 1 tab with snack. 6. Nutrition - Renal/Carb mod diet.  7. DM- per primary.    Ernest Haber, PA-C [facility] [phone] 08/23/2019,4:06 PM  LOS: 5 days   Labs: Basic Metabolic Panel: Recent Labs  Lab 08/19/19 0533 08/20/19 0420 08/21/19 0630  NA 139 135 136  K 3.5 3.7 3.7  CL 97* 94* 95*  CO2 27 24 26   GLUCOSE 113* 114* 132*  BUN 38* 50* 28*  CREATININE 7.53* 9.46* 6.80*  CALCIUM 8.0* 8.3* 8.6*   Liver Function Tests: Recent Labs  Lab 08/20/19 0420 08/21/19 0630   AST 12* 12*  ALT 10 6  ALKPHOS 48 49  BILITOT 0.8 0.6  PROT 7.7 7.8  ALBUMIN 2.9* 2.8*   No results for input(s): LIPASE, AMYLASE in the last 168 hours. No results for input(s): AMMONIA in the last 168 hours. CBC: Recent Labs  Lab 08/19/19 0533 08/20/19 0420 08/21/19 0630  WBC 8.0 8.7 8.3  NEUTROABS  --  5.9 5.8  HGB 8.8* 9.2* 9.6*  HCT 27.9* 28.9* 30.1*  MCV 78.6* 78.1* 78.2*  PLT 218 227 224   Cardiac Enzymes: No results for input(s): CKTOTAL, CKMB, CKMBINDEX, TROPONINI in the last 168 hours. CBG: Recent Labs  Lab 08/22/19 1330 08/22/19 1620 08/22/19 2133 08/23/19 0645 08/23/19 1115  GLUCAP 119* 117* 142* 115* 125*     Medications: . sodium chloride 10 mL/hr at 08/22/19 0906  . sodium chloride    . ceFEPime (MAXIPIME) IV Stopped (08/22/19 2209)  . vancomycin Stopped (08/20/19 1049)   . amiodarone  200 mg Oral Daily  . aspirin EC  81 mg Oral Daily  . atorvastatin  80 mg Oral Daily  . clopidogrel  75 mg Oral Q breakfast  . collagenase   Topical Daily  . darbepoetin (ARANESP) injection - DIALYSIS  60 mcg Intravenous Q Tue-HD  . docusate sodium  100 mg  Oral QODAY  . doxercalciferol  3 mcg Intravenous Q T,Th,Sa-HD  . ferric citrate  420 mg Oral TID WC  . heparin injection (subcutaneous)  5,000 Units Subcutaneous Q8H  . insulin aspart  0-5 Units Subcutaneous QHS  . insulin aspart  0-9 Units Subcutaneous TID WC  . midodrine  5 mg Oral TID WC  .  morphine injection  1 mg Intravenous Once  . multivitamin  1 tablet Oral QHS  . sodium chloride flush  3 mL Intravenous Q12H

## 2019-08-23 NOTE — Progress Notes (Signed)
PROGRESS NOTE    Alexis [name]  JQB:[id number] DOB: [birth year] DOA: 08/18/2019 PCP: System, Provider Not In   Brief Narrative:  HPI: Alexis [name] is a 74 y.o. female with medical history significant of A. fib on Eliquis, ESRD on HD TTS, CAD, pulmonary hypertension, LBBB, hypertension, hyperlipidemia, CHF, type 2 diabetes, diabetic neuropathy, CVA presented to [facility] ED with complaints of bilateral heel ulcers.  Diagnosed with osteomyelitis of the right fifth metatarsal head and transferred to [facility] for further management as she is a dialysis patient.  Patient reports 1 month history of bilateral heel ulcers and pain in her feet.  Denies fevers or chills.  States she vomited once after eating breakfast today but no further episodes of vomiting or nausea since then.  Denies abdominal pain or diarrhea.    ED Course: Afebrile and hemodynamically stable CBC: Hemoglobin 9.3, hematocrit 29.5, WBC 7.5, platelet 409 Metabolic panel: Sodium 735, potassium 3.3, chloride 96, bicarb 26, BUN 31, creatinine 6.1, glucose 149 Lactate 1.5 SARS-CoV-2 test negative, respiratory viral panel negative Blood cultures negative so far.   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  We will continue vancomycin and cefepime at this time. Followed by orthopedics and vascular surgery.  Underwent revascularization 08/22/2019 , " Intravascular lithotripsy, right superficial femoral and popliteal artery  and Drug-eluting stent, right superficial femoral-popliteal artery" Continue to hold anticoagulation for anticipated surgical procedure.  Further management per orthopedics if planning any debridements. Dr. Sharol Given suggested left above-knee amputation and wound debridement for right heel, planning for surgery inpatient.  Paroxysmal atrial fibrillation: Hold Eliquis at this time.  Will likely need surgery given osteomyelitis, rate controlled.  Continue home dose of amiodarone.  Monitor on telemetry.  ESRD on HD: Receiving dialysis on schedule as per nephrology.  Insulin-dependent type 2 diabetes: Takes 70/30 insulin at home.  Blood sugars well controlled currently on sliding scale insulin.  Anemia of chronic disease.  Hemoglobin stable.   DVT prophylaxis:  heparin DVT prophylaxis dose   Code Status: Full Code  Family Communication: Patient.  Try to connect to the numbers to the family.  The number for daughter says wrong number.  Has been unable to pick up the phone. Patient is from: Home Disposition Plan: Patient from home.  Discharge plan Home versus SNF. Barriers to discharge: Pending consultation and further surgical intervention for osteomyelitis by orthopedics.  Patient will need further surgical interventions in the hospital.   Estimated body mass index is 35.83 kg/m as calculated from the following:   Height as of 07/22/19: 5\' 6"  (1.676 m).   Weight as of this encounter: 100.7 kg.  Pressure Injury 08/18/19 Buttocks Left Stage 2 -  Partial thickness loss of dermis presenting as a shallow open injury with a red, pink wound bed without slough. (Active)  08/18/19 1930  Location: Buttocks  Location Orientation: Left  Staging: Stage 2 -  Partial thickness loss of dermis presenting as a shallow open injury with a red, pink wound bed without slough.  Wound Description (Comments):  Present on Admission: Yes     Nutritional status:               Consultants:    Orthopedics  Vascular surgery  Nephrology  Procedures:   Aortogram, lower extremity angiogram and stent.  Antimicrobials:  Anti-infectives (From admission, onward)   Start     Dose/Rate Route Frequency Ordered Stop   08/22/19 1824  vancomycin (VANCOCIN) 1-5 GM/200ML-% IVPB    Note to Pharmacy: Ronny Bacon  : cabinet override      08/22/19 1824 08/23/19 0629   08/20/19 1200  vancomycin (VANCOCIN) IVPB 1000 mg/200 mL premix     1,000 mg 200 mL/hr over 60 Minutes Intravenous Every T-Th-Sa (Hemodialysis) 08/19/19 1501     08/19/19 2000  ceFEPIme (MAXIPIME) 1 g in sodium chloride 0.9 % 100 mL IVPB     1 g 200 mL/hr over 30 Minutes Intravenous Every 24 hours 08/18/19 2000     08/18/19 2015  ceFEPIme (MAXIPIME) 2 g in sodium chloride 0.9 % 100 mL IVPB     2 g 200 mL/hr over 30 Minutes Intravenous  Once 08/18/19 2000 08/18/19 2139   08/18/19 2015  vancomycin (VANCOREADY) IVPB 500 mg/100 mL     500 mg 100 mL/hr over 60 Minutes Intravenous  Once 08/18/19 2000 08/18/19 2339   08/18/19 2000  vancomycin variable dose per unstable renal function (pharmacist dosing)  Status:  Discontinued      Does not apply See admin instructions 08/18/19 2000 08/19/19 1501         Subjective: Patient seen and examined.  She remains lethargic today.  She declined to eat food in the morning.  Blood pressures stable.  No focal deficits.  Objective: Vitals:   08/23/19 0147 08/23/19 0152 08/23/19 0418 08/23/19 0800  BP: (!) 105/42 (!) 110/39 (!) 111/49 (!) 153/125  Pulse: 79 79 83 88  Resp: 16 16 16 18   Temp:  98 F (36.7 C) 99.4 F (37.4 C) 99.1 F (37.3 C)  TempSrc:  Oral Oral Oral  SpO2:  100% 96% 98%  Weight:  100.7 kg      Intake/Output Summary (Last 24 hours) at 08/23/2019 1303 Last data filed at 08/23/2019 0800 Gross per 24 hour  Intake 340 ml  Output --  Net 340 ml   Filed Weights   08/22/19 1613 08/22/19 2236 08/23/19 0152  Weight: 95.6 kg 102.5 kg 100.7 kg     Examination:  General exam: Appears calm and comfortable , chronically sick looking.  Sleepy. Respiratory system: Clear to auscultation. Respiratory effort normal. Cardiovascular system: S1 & S2 heard, irregularly irregular rate and rhythm. No JVD, murmurs, rubs, gallops or clicks. Gastrointestinal system: Abdomen is nondistended, soft and nontender. No organomegaly or masses felt. Normal bowel sounds heard. Central nervous system: Patient is lethargic and sleepy. Extremities: Symmetric 5 x 5 power.  Necrotic head of the second and third right toe.  Missing right fifth toe.  Open ulcer calcaneum with some drainage.  Groin is clean and dry. Psychiatry: Judgement and insight appear compromised.  Data Reviewed: I have personally reviewed following labs and imaging studies  CBC: Recent Labs  Lab 08/19/19 0533 08/20/19 0420 08/21/19 0630  WBC 8.0 8.7 8.3  NEUTROABS  --  5.9 5.8  HGB 8.8* 9.2* 9.6*  HCT 27.9* 28.9* 30.1*  MCV 78.6* 78.1* 78.2*  PLT 218 227 696   Basic Metabolic Panel: Recent Labs  Lab 08/19/19 0533 08/20/19 0420 08/21/19 0630  NA 139 135 136  K 3.5 3.7 3.7  CL 97* 94* 95*  CO2 27 24 26   GLUCOSE 113* 114* 132*  BUN 38* 50* 28*  CREATININE 7.53* 9.46* 6.80*  CALCIUM 8.0* 8.3* 8.6*   GFR: Estimated Creatinine Clearance: 8.8 mL/min (A) (by C-G formula based on SCr of 6.8 mg/dL (H)). Liver Function Tests: Recent Labs  Lab 08/20/19 0420 08/21/19 0630  AST 12* 12*  ALT 10 6  ALKPHOS 48 49  BILITOT 0.8 0.6  PROT 7.7 7.8  ALBUMIN 2.9* 2.8*   No results for input(s): LIPASE, AMYLASE in the last 168 hours. No results for input(s): AMMONIA in the last 168 hours. Coagulation Profile: No results for input(s): INR, PROTIME in the last 168 hours. Cardiac Enzymes: No results for input(s): CKTOTAL, CKMB, CKMBINDEX, TROPONINI in the last 168 hours. BNP (last 3 results) No results for input(s): PROBNP in the last 8760 hours. HbA1C: No results for  input(s): HGBA1C in the last 72 hours. CBG: Recent Labs  Lab 08/22/19 1330 08/22/19 1620 08/22/19 2133 08/23/19 0645 08/23/19 1115  GLUCAP 119* 117* 142* 115* 125*   Lipid Profile: No results for input(s): CHOL, HDL, LDLCALC, TRIG, CHOLHDL, LDLDIRECT in the last 72 hours. Thyroid Function Tests: No results for input(s): TSH, T4TOTAL, FREET4, T3FREE, THYROIDAB in the last 72 hours. Anemia Panel: No results for input(s): VITAMINB12, FOLATE, FERRITIN, TIBC, IRON, RETICCTPCT in the last 72 hours. Sepsis Labs: No results for input(s): PROCALCITON, LATICACIDVEN in the last 168 hours.  Recent Results (from the past 240 hour(s))  Culture, blood (routine x 2)     Status: None   Collection Time: 08/18/19  8:09 PM   Specimen: BLOOD LEFT HAND  Result Value Ref Range Status   Specimen Description BLOOD LEFT HAND  Final   Special Requests   Final    BOTTLES DRAWN AEROBIC AND ANAEROBIC Blood Culture adequate volume   Culture   Final    NO GROWTH 5 DAYS Performed at [facility], [address] [place], [place] [address]    Report Status 08/23/2019 FINAL  Final  Culture, blood (routine x 2)     Status: None   Collection Time: 08/18/19  8:17 PM   Specimen: BLOOD LEFT HAND  Result Value Ref Range Status   Specimen Description BLOOD LEFT HAND  Final   Special Requests   Final    BOTTLES DRAWN AEROBIC AND ANAEROBIC Blood Culture adequate volume   Culture   Final    NO GROWTH 5 DAYS Performed at [facility], [place] [address], [place], [place] [address]    Report Status 08/23/2019 FINAL  Final  MRSA PCR Screening     Status: None   Collection Time: 08/19/19  8:23 AM   Specimen: Nasal Mucosa; Nasopharyngeal  Result Value Ref Range Status   MRSA by PCR NEGATIVE NEGATIVE Final    Comment:        The GeneXpert MRSA Assay (FDA approved for NASAL specimens only), is one component of a comprehensive MRSA colonization surveillance program. It is not intended to diagnose  MRSA infection nor to guide or monitor treatment for MRSA infections. Performed at [facility], [place] [address], [place], [place] [address]   SARS CORONAVIRUS 2 (TAT 6-24 HRS) Nasopharyngeal Nasopharyngeal Swab     Status: None   Collection Time: 08/22/19  4:25 AM   Specimen: Nasopharyngeal Swab  Result Value Ref Range Status   SARS Coronavirus 2 NEGATIVE NEGATIVE Final    Comment: (NOTE)  SARS-CoV-2 target nucleic acids are NOT DETECTED. The SARS-CoV-2 RNA is generally detectable in upper and lower respiratory specimens during the acute phase of infection. Negative results do not preclude SARS-CoV-2 infection, do not rule out co-infections with other pathogens, and should not be used as the sole basis for treatment or other patient management decisions. Negative results must be combined with clinical observations, patient history, and epidemiological information. The expected result is Negative. Fact Sheet for Patients: SugarRoll.be Fact Sheet for Healthcare Providers: https://www.woods-mathews.com/ This test is not yet approved or cleared by the [place] FDA and  has been authorized for detection and/or diagnosis of SARS-CoV-2 by FDA under an Emergency Use Authorization (EUA). This EUA will remain  in effect (meaning this test can be used) for the duration of the COVID-19 declaration under Section 56 4(b)(1) of the Act, 21 U.S.C. section 360bbb-3(b)(1), unless the authorization is terminated or revoked sooner. Performed at [facility], [place] [address] [place], [place] [address]       Radiology Studies: PERIPHERAL VASCULAR CATHETERIZATION  Result Date: 08/22/2019 Patient name: ABBYGAYLE HELFAND MRN: [id number] DOB: [birth year] Sex: female 08/22/2019 Pre-operative Diagnosis: Bilateral lower extremity ulcer Post-operative diagnosis:  Same Surgeon:  [name] Procedure Performed:  1.  Ultrasound-guided access, right femoral  artery  2.  Ultrasound-guided access, left femoral artery  3.  Abdominal aortogram  4.  Bilateral lower extremity runoff  5.  Intravascular lithotripsy, right superficial femoral and popliteal artery  6.  Drug-eluting stent, right superficial femoral-popliteal artery  7.  Conscious sedation, 143 minutes Indications: The patient has extensive bilateral lower extremity ulcers.  She is here today for limb salvage. Procedure:  The patient was identified in the holding area and taken to room 8.  The patient was then placed supine on the table and prepped and draped in the usual sterile fashion.  A time out was called.  Conscious sedation was administered with the use of IV fentanyl and Versed under continuous physician and nurse monitoring.  Heart rate, blood pressure, and oxygen saturation were continuously monitored.  Total sedation time was 143 minutes.  I initially tried to get access into the left common femoral artery, however I was unsuccessful therefore I turned my attention towards the right femoral artery.  Ultrasound was used to evaluate the right common femoral artery.  It was patent .  A digital ultrasound image was acquired.  A micropuncture needle was used to access the right common femoral artery under ultrasound guidance.  An 018 wire was advanced without resistance and a micropuncture sheath was placed.  The 018 wire was removed and a benson wire was placed.  The micropuncture sheath was exchanged for a 5 french sheath.  An omniflush catheter was advanced over the wire to the level of L-1.  An abdominal angiogram was obtained.  Next, the cath was pulled out of the aortic bifurcation and bilateral runoff was performed.  I then followed up for the intervention by gaining access under ultrasound guidance of the left common femoral artery. Findings:  Aortogram: No significant infrarenal aortic stenosis.  Bilateral common and external iliac arteries are patent throughout the course however they are heavily  calcified.   Right Lower Extremity:  The right common femoral and profundofemoral artery are heavily calcified but patent throughout their course.  The superficial femoral artery is diffusely diseased with multiple high-grade lesions down to the adductor canal where it occludes.  There is reconstitution of the popliteal artery just below the joint  space.  There is two-vessel runoff via the peroneal and posterior tibial artery.  Left Lower Extremity: The common femoral and profundofemoral artery are patent without stenosis but heavily calcified.  The superficial femoral artery is occluded with reconstitution of the superficial femoral artery at the adductor canal with diffusely diseased three-vessel runoff and minimal opacification of the digital arteries. Intervention: After the above images were acquired the decision made to proceed with intervention.  The sheath in the right groin was removed.  I tried to use a minx however this failed because the balloon popped.  Manual pressure was held for 10 minutes.  I then placed a 7 x 45 sheath from the left groin into the right external iliac artery and fully heparinized the patient.  Using a quick cross catheter and 035 Glidewire followed by a Glidewire advantage I was able to get across the stenosis.  The lesion was predilated with a 4 x 100 balloon.  I then performed shockwave intravascular ultrasound of the popliteal artery just distal to the joint space up to its origin.  A 5 x 60 balloon was used for the distal half and a 6 x 60 balloon was used for the proximal half.  Total treatment time was 510 seconds.  I then elected to stent the treated area.  A 5 x 100 [place] stent was placed distally followed by Elluvia 6 x 120, 7 x 120, and 7 x 40.  The stents were then molded with a 5 mm balloon and completion imaging showed inline flow down across the foot.  Catheters and wires were removed.  A short 7 French sheath was placed.  Patient tolerated the procedure well there  were no complications. 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  Successfully recanalized and treated with shockwave intravascular lithotripsy followed by drug-eluting stent placement from just below the joint space in the popliteal artery up to the origin of the superficial femoral artery.  There is two-vessel runoff via the peroneal and posterior tibial artery.  #2  Left superficial femoral artery occlusion  V. [name], M.D., [facility] [place] Office: [phone] Pager:  [phone]   Scheduled Meds: . amiodarone  200 mg Oral Daily  . aspirin EC  81 mg Oral Daily  . atorvastatin  80 mg Oral Daily  . clopidogrel  75 mg Oral Q breakfast  . collagenase   Topical Daily  . darbepoetin (ARANESP) injection - DIALYSIS  60 mcg Intravenous Q Tue-HD  . docusate sodium  100 mg Oral QODAY  . doxercalciferol  3 mcg Intravenous Q T,Th,Sa-HD  . ferric citrate  420 mg Oral TID WC  . heparin injection (subcutaneous)  5,000 Units Subcutaneous Q8H  . insulin aspart  0-5 Units Subcutaneous QHS  . insulin aspart  0-9 Units Subcutaneous TID WC  . midodrine  5 mg Oral TID WC  .  morphine injection  1 mg Intravenous Once  . multivitamin  1 tablet Oral QHS  . sodium chloride flush  3 mL Intravenous Q12H   Continuous Infusions: . sodium chloride 10 mL/hr at 08/22/19 0906  . sodium chloride    . ceFEPime (MAXIPIME) IV Stopped (08/22/19 2209)  . vancomycin Stopped (08/20/19 1049)     LOS: 5 days   Time spent: 25 minutes   Barb Merino, MD [facility]  08/23/2019, 1:03 PM

## 2019-08-23 NOTE — Progress Notes (Addendum)
Pt encouraged to eat lunch but refusing.  Also refusing 1200 meds (auryxia, midodrine).  Holding sliding scale insulin for CBG of 125 due to pt not eating.  MD has been made aware that pt is refusing meds.

## 2019-08-23 NOTE — Progress Notes (Signed)
    Subjective  - POD #1, s/p right leg angio with stenting  Resting comfortably   Physical Exam:  Feet in appropriate protective boots Both groin canulation sites without hematoma Brisk PT/AT/peroneal signals in right foot       Assessment/Plan:  POD #313  Started on ASA and Plavix post procedure given placement of drug eluting stent.  She will ned these for 1 month -OK to restart anticoagulation when appropriate from Dr. Jess Barters perspective -continue statin therapy -Continue IV abx  Alexis [name] 08/23/2019 7:45 AM --  Vitals:   08/23/19 0152 08/23/19 0418  BP: (!) 110/39 (!) 111/49  Pulse: 79 83  Resp: 16 16  Temp: 98 F (36.7 C) 99.4 F (37.4 C)  SpO2: 100% 96%    Intake/Output Summary (Last 24 hours) at 08/23/2019 0745 Last data filed at 08/23/2019 0000 Gross per 24 hour  Intake 340 ml  Output --  Net 340 ml     Laboratory CBC    Component Value Date/Time   WBC 8.3 08/21/2019 0630   HGB 9.6 (L) 08/21/2019 0630   HCT 30.1 (L) 08/21/2019 0630   PLT 224 08/21/2019 0630    BMET    Component Value Date/Time   NA 136 08/21/2019 0630   K 3.7 08/21/2019 0630   CL 95 (L) 08/21/2019 0630   CO2 26 08/21/2019 0630   GLUCOSE 132 (H) 08/21/2019 0630   BUN 28 (H) 08/21/2019 0630   CREATININE 6.80 (H) 08/21/2019 0630   CALCIUM 8.6 (L) 08/21/2019 0630   GFRNONAA 6 (L) 08/21/2019 0630   GFRAA 6 (L) 08/21/2019 0630    COAG Lab Results  Component Value Date   INR 3.1 (H) 06/11/2019   INR 1.0 05/09/2019   INR 1.12 04/06/2018   No results found for: PTT  Antibiotics Anti-infectives (From admission, onward)   Start     Dose/Rate Route Frequency Ordered Stop   08/22/19 1824  vancomycin (VANCOCIN) 1-5 GM/200ML-% IVPB    Note to Pharmacy: Ronny Bacon  : cabinet override      08/22/19 1824 08/23/19 0629   08/20/19 1200  vancomycin (VANCOCIN) IVPB 1000 mg/200 mL premix     1,000 mg 200 mL/hr over 60 Minutes Intravenous Every T-Th-Sa (Hemodialysis)  08/19/19 1501     08/19/19 2000  ceFEPIme (MAXIPIME) 1 g in sodium chloride 0.9 % 100 mL IVPB     1 g 200 mL/hr over 30 Minutes Intravenous Every 24 hours 08/18/19 2000     08/18/19 2015  ceFEPIme (MAXIPIME) 2 g in sodium chloride 0.9 % 100 mL IVPB     2 g 200 mL/hr over 30 Minutes Intravenous  Once 08/18/19 2000 08/18/19 2139   08/18/19 2015  vancomycin (VANCOREADY) IVPB 500 mg/100 mL     500 mg 100 mL/hr over 60 Minutes Intravenous  Once 08/18/19 2000 08/18/19 2339   08/18/19 2000  vancomycin variable dose per unstable renal function (pharmacist dosing)  Status:  Discontinued      Does not apply See admin instructions 08/18/19 2000 08/19/19 1501       V. Leia Alf, M.D., [facility] [place] Office: [phone] Pager:  [phone]

## 2019-08-23 NOTE — Progress Notes (Signed)
Patient ID: Alexis [name], female   DOB: 01-13-1946, 74 y.o.   MRN: [id number] Patient is status post revascularization to the right lower extremity.  Patient has no inline flow below the knee on the left.  I discussed with the patient these findings and I recommended that she proceed with above-knee amputation on the left.  I tried to contact family while at bedside but there was no answer.  Patient will need to proceed with a left above-the-knee amputation once patient and family agreed to proceed with surgery.  I will place orders for Santyl dressing changes to the lateral malleolus right ankle.  Anticipate this should heal with wound care.

## 2019-08-23 NOTE — Progress Notes (Signed)
Pt refusing to take all po medications this morning. Meds crushed and offered in apple sauce, continues to refuse, stating she can't eat apple sauce.  Offered pudding, yogurt, ice cream, refuses all alternatives. Breakfast in room, pt assisted to eat but also refuses.

## 2019-08-24 DIAGNOSIS — N186 End stage renal disease: Secondary | ICD-10-CM

## 2019-08-24 DIAGNOSIS — M869 Osteomyelitis, unspecified: Secondary | ICD-10-CM | POA: Diagnosis not present

## 2019-08-24 DIAGNOSIS — M86172 Other acute osteomyelitis, left ankle and foot: Secondary | ICD-10-CM | POA: Diagnosis not present

## 2019-08-24 DIAGNOSIS — E11621 Type 2 diabetes mellitus with foot ulcer: Secondary | ICD-10-CM | POA: Diagnosis not present

## 2019-08-24 DIAGNOSIS — L97424 Non-pressure chronic ulcer of left heel and midfoot with necrosis of bone: Secondary | ICD-10-CM

## 2019-08-24 DIAGNOSIS — Z992 Dependence on renal dialysis: Secondary | ICD-10-CM

## 2019-08-24 DIAGNOSIS — D631 Anemia in chronic kidney disease: Secondary | ICD-10-CM

## 2019-08-24 LAB — CBC WITH DIFFERENTIAL/PLATELET
Abs Immature Granulocytes: 0.06 10*3/uL (ref 0.00–0.07)
Basophils Absolute: 0.1 10*3/uL (ref 0.0–0.1)
Basophils Relative: 1 %
Eosinophils Absolute: 0.5 10*3/uL (ref 0.0–0.5)
Eosinophils Relative: 5 %
HCT: 29.2 % — ABNORMAL LOW (ref 36.0–46.0)
Hemoglobin: 9.2 g/dL — ABNORMAL LOW (ref 12.0–15.0)
Immature Granulocytes: 1 %
Lymphocytes Relative: 11 %
Lymphs Abs: 1 10*3/uL (ref 0.7–4.0)
MCH: 24.9 pg — ABNORMAL LOW (ref 26.0–34.0)
MCHC: 31.5 g/dL (ref 30.0–36.0)
MCV: 78.9 fL — ABNORMAL LOW (ref 80.0–100.0)
Monocytes Absolute: 1.2 10*3/uL — ABNORMAL HIGH (ref 0.1–1.0)
Monocytes Relative: 14 %
Neutro Abs: 6.2 10*3/uL (ref 1.7–7.7)
Neutrophils Relative %: 68 %
Platelets: 199 10*3/uL (ref 150–400)
RBC: 3.7 MIL/uL — ABNORMAL LOW (ref 3.87–5.11)
RDW: 18.9 % — ABNORMAL HIGH (ref 11.5–15.5)
WBC: 9.1 10*3/uL (ref 4.0–10.5)
nRBC: 0 % (ref 0.0–0.2)

## 2019-08-24 LAB — COMPREHENSIVE METABOLIC PANEL
ALT: 7 U/L (ref 0–44)
AST: 14 U/L — ABNORMAL LOW (ref 15–41)
Albumin: 2.8 g/dL — ABNORMAL LOW (ref 3.5–5.0)
Alkaline Phosphatase: 48 U/L (ref 38–126)
Anion gap: 15 (ref 5–15)
BUN: 25 mg/dL — ABNORMAL HIGH (ref 8–23)
CO2: 24 mmol/L (ref 22–32)
Calcium: 9 mg/dL (ref 8.9–10.3)
Chloride: 97 mmol/L — ABNORMAL LOW (ref 98–111)
Creatinine, Ser: 6.69 mg/dL — ABNORMAL HIGH (ref 0.44–1.00)
GFR calc Af Amer: 7 mL/min — ABNORMAL LOW (ref >60)
GFR calc non Af Amer: 6 mL/min — ABNORMAL LOW (ref >60)
Glucose, Bld: 130 mg/dL — ABNORMAL HIGH (ref 70–99)
Potassium: 3.8 mmol/L (ref 3.5–5.1)
Sodium: 136 mmol/L (ref 135–145)
Total Bilirubin: 1 mg/dL (ref 0.3–1.2)
Total Protein: 7.8 g/dL (ref 6.5–8.1)

## 2019-08-24 LAB — PHOSPHORUS: Phosphorus: 3.9 mg/dL (ref 2.5–4.6)

## 2019-08-24 LAB — GLUCOSE, CAPILLARY
Glucose-Capillary: 119 mg/dL — ABNORMAL HIGH (ref 70–99)
Glucose-Capillary: 167 mg/dL — ABNORMAL HIGH (ref 70–99)
Glucose-Capillary: 169 mg/dL — ABNORMAL HIGH (ref 70–99)

## 2019-08-24 LAB — MAGNESIUM: Magnesium: 1.9 mg/dL (ref 1.7–2.4)

## 2019-08-24 MED ORDER — VANCOMYCIN HCL IN DEXTROSE 1-5 GM/200ML-% IV SOLN: 1000.0000 mg | INTRAVENOUS | Status: DC

## 2019-08-24 MED ORDER — VANCOMYCIN HCL IN DEXTROSE 750-5 MG/150ML-% IV SOLN
750.0000 mg | INTRAVENOUS | Status: DC
  Filled 2019-08-24: qty 150

## 2019-08-24 MED ORDER — QUETIAPINE FUMARATE 25 MG PO TABS
25.0000 mg | ORAL_TABLET | Freq: Every day | ORAL | Status: DC
  Administered 2019-08-24 – 2019-08-26 (×3): 25 mg via ORAL
  Filled 2019-08-24 (×3): qty 1

## 2019-08-24 MED ORDER — HALOPERIDOL LACTATE 5 MG/ML IJ SOLN
2.0000 mg | Freq: Four times a day (QID) | INTRAMUSCULAR | Status: DC | PRN
  Administered 2019-08-27: 2 mg via INTRAVENOUS
  Filled 2019-08-24: qty 1

## 2019-08-24 NOTE — Progress Notes (Signed)
Garner Nash, RN called and notified the patient's HD has been moved to 08/25/19.

## 2019-08-24 NOTE — Progress Notes (Signed)
Dialysis advises they will run her tomorrow.  Jerald Kief, RN

## 2019-08-24 NOTE — Progress Notes (Signed)
Pharmacy Antibiotic Note  Alexis [name] is a 74 y.o. female admitted on 08/18/2019 with r/o osteo.  Pharmacy has been consulted for Vanco/Cefepime dosing.   ID: Heel osteomyelitis. Dr. Sharol Given consulted and recommending BKA  Vanco 4/5>> Cefepime 4/5>>  4/4: BC x 2>> negative 4/5: MRSA PCR: negative 4/8: COVID: negative MRSA PCR: negative   Plan: - Vanco 750 mg IV with HD today - Vancomycin 1000 mg IV with HD for future doses on TTS schedule - Random vanc level ordered for tomorrow morning - Cefepime 1g QHS - Awaiting BKA - Eliqius on hold as patient likely in need of surgey    Weight: 100.7 kg (222 lb 0.1 oz)  Temp (24hrs), Avg:98.8 F (37.1 C), Min:97.9 F (36.6 C), Max:99.5 F (37.5 C)  Recent Labs  Lab 08/19/19 0533 08/20/19 0420 08/21/19 0630 08/24/19 0249  WBC 8.0 8.7 8.3 9.1  CREATININE 7.53* 9.46* 6.80* 6.69*    Estimated Creatinine Clearance: 9 mL/min (A) (by C-G formula based on SCr of 6.69 mg/dL (H)).    Allergies  Allergen Reactions  . Olmesartan Other (See Comments)    Hyperkalemia    Sherren Kerns, PharmD PGY1 Acute Care Pharmacy Resident 08/24/2019 11:10 AM

## 2019-08-24 NOTE — Progress Notes (Signed)
PROGRESS NOTE                                                                                                                                                                                                             Patient Demographics:    Alexis [name], is a 74 y.o. female, DOB - Oct 11, 1945, URK:[id number]  Admit date - 08/18/2019   Admitting Physician Shela Leff, MD  Outpatient Primary MD for the patient is System, Provider Not In  LOS - 6  No chief complaint on file.      Brief Narrative  [place] a 74 y.o.femalewith medical history significant ofA. fib on Eliquis, ESRD on HD TTS, CAD, pulmonary hypertension, LBBB, hypertension, hyperlipidemia, CHF, type 2 diabetes, diabetic neuropathy, [facility] of bilateral [facility]. Diagnosed with osteomyelitis of the right fifth metatarsal head and transferred to [facility] for further management as she is a dialysis patient.   Subjective:    Alexis [name] today has, No headache, No chest pain, No abdominal pain - No Nausea, No new weakness tingling or numbness, No Cough - SOB.     Assessment  & Plan :     Diabetic foot ulcers/concern for osteomyelitis: Also neuropathic and vascular ulcers.  Left heel osteomyelitis/calcaneal osteomyelitis, Right distal fibula osteomyelitis. She underwent revascularization this admission on 08/22/2019 by vascular surgery.  She received a drug-eluting stent and currently on dual antiplatelet therapy, however her left lower extremity foot wound does not seem to be healing well, seen by orthopedics and BKA recommended.  Continue empiric antibiotics, await BKA soon.  Discussed the plan with orthopedic surgeon Dr. Sharol Given on 08/24/2019.  Paroxysmal atrial fibrillation: Hold Eliquis at this time.  Will likely need surgery given osteomyelitis, rate controlled.  Continue home dose of amiodarone.  Monitor on telemetry.  Anemia of chronic disease.   Hemoglobin stable.  ESRD on HD: Receiving dialysis on schedule as per nephrology. TTS.  Dementia with hospital-acquired delirium due to unfamiliar setting along with toxic encephalopathy due to infection.  Supportive care, minimize narcotics and benzodiazepines, family made aware that her delirium might get worse, as needed Haldol and nighttime Seroquel.    Insulin-dependent type 2 diabetes: Takes 70/30 insulin at home.  Blood sugars well controlled currently on sliding scale insulin.  Lab Results  Component Value Date   HGBA1C 6.2 (H) 08/18/2019   CBG (last 3)  Recent Labs    08/23/19 2116 08/24/19 0621 08/24/19 1046  GLUCAP 120*  [place]       Family Communication  :  Daughter Alexis [name] [phone], 08/24/19, Husband Nayanna Seaborn on [phone], 08/24/19 -   Code Status :  Full  Disposition Plan  : In the hospital for treatment of gangrene, patient has PAD and may require BKA this admission  Consults  :  Ortho, Renal  Procedures  :    R Leg - revascularization 08/22/2019 - " Intravascular lithotripsy, right superficial femoral and popliteal artery and Drug-eluting stent, right superficial femoral-popliteal artery" Continue to hold anticoagulation for anticipated surgical procedure.  Further management per orthopedics if planning any debridements.   DVT Prophylaxis  :  Heparin   Lab Results  Component Value Date   PLT 199 08/24/2019    Diet :  Diet Order            Diet renal/carb modified with fluid restriction Diet-HS Snack? Nothing; Fluid restriction: 1200 mL Fluid; Room service appropriate? No; Fluid consistency: Thin  Diet effective now               Inpatient Medications Scheduled Meds: . amiodarone  200 mg Oral Daily  . aspirin EC  81 mg Oral Daily  . atorvastatin  80 mg Oral Daily  . clopidogrel  75 mg Oral Q breakfast  . collagenase   Topical Daily  . darbepoetin (ARANESP) injection - DIALYSIS  60 mcg Intravenous Q Tue-HD  . docusate sodium  100  mg Oral QODAY  . doxercalciferol  3 mcg Intravenous Q T,Th,Sa-HD  . ferric citrate  420 mg Oral TID WC  . heparin injection (subcutaneous)  5,000 Units Subcutaneous Q8H  . insulin aspart  0-5 Units Subcutaneous QHS  . insulin aspart  0-9 Units Subcutaneous TID WC  . midodrine  5 mg Oral TID WC  . multivitamin  1 tablet Oral QHS   Continuous Infusions: . sodium chloride 10 mL/hr at 08/22/19 0906  . ceFEPime (MAXIPIME) IV 1 g (08/23/19 2015)  . vancomycin Stopped (08/20/19 1049)   PRN Meds:.acetaminophen **OR** acetaminophen, albuterol, hydrALAZINE, labetalol, nitroGLYCERIN, ondansetron (ZOFRAN) IV, polyethylene glycol  Antibiotics  :   Anti-infectives (From admission, onward)   Start     Dose/Rate Route Frequency Ordered Stop   08/22/19 1824  vancomycin (VANCOCIN) 1-5 GM/200ML-% IVPB    Note to Pharmacy: Ronny Bacon  : cabinet override      08/22/19 1824 08/23/19 0629   08/20/19 1200  vancomycin (VANCOCIN) IVPB 1000 mg/200 mL premix     1,000 mg 200 mL/hr over 60 Minutes Intravenous Every T-Th-Sa (Hemodialysis) 08/19/19 1501     08/19/19 2000  ceFEPIme (MAXIPIME) 1 g in sodium chloride 0.9 % 100 mL IVPB     1 g 200 mL/hr over 30 Minutes Intravenous Every 24 hours 08/18/19 2000     08/18/19 2015  ceFEPIme (MAXIPIME) 2 g in sodium chloride 0.9 % 100 mL IVPB     2 g 200 mL/hr over 30 Minutes Intravenous  Once 08/18/19 2000 08/18/19 2139   08/18/19 2015  vancomycin (VANCOREADY) IVPB 500 mg/100 mL     500 mg 100 mL/hr over 60 Minutes Intravenous  Once 08/18/19 2000 08/18/19 2339   08/18/19 2000  vancomycin variable dose per unstable renal function (pharmacist dosing)  Status:  Discontinued      Does not apply See admin instructions 08/18/19 2000 08/19/19 1501          Objective:   Vitals:   08/23/19 1600 08/23/19 2000 08/24/19 0400 08/24/19 6834  BP: (!) 113/52  131/61 (!) 118/56  Pulse:   80 74  Resp: (!) 22  18 20   Temp:  99.2 F (37.3 C) 98.6 F (37 C) 97.9 F  (36.6 C)  TempSrc:  Oral Oral Oral  SpO2:  100% 100% 98%  Weight:        SpO2: 98 % O2 Flow Rate (L/min): 2 L/min  Wt Readings from Last 3 Encounters:  08/23/19 100.7 kg  07/22/19 104.3 kg  06/11/19 98.4 kg     Intake/Output Summary (Last 24 hours) at 08/24/2019 1052 Last data filed at 08/23/2019 1300 Gross per 24 hour  Intake 120 ml  Output --  Net 120 ml     Physical Exam  Awake and pleasantly confused, No new F.N deficits,   [place].AT,PERRAL Supple Neck,No JVD, No cervical lymphadenopathy appriciated.  Symmetrical Chest wall movement, Good air movement bilaterally, CTAB RRR,No Gallops,Rubs or new Murmurs, No Parasternal Heave +ve B.Sounds, Abd Soft, No tenderness, No organomegaly appriciated, No rebound - guarding or rigidity. No Cyanosis, bilateral lower extremity heel wounds left worse than right    Data Review:    Recent Labs  Lab 08/19/19 0533 08/20/19 0420 08/21/19 0630 08/24/19 0249  WBC 8.0 8.7 8.3 9.1  HGB 8.8* 9.2* 9.6* 9.2*  HCT 27.9* 28.9* 30.1* 29.2*  PLT 218 227 224 199  MCV 78.6* 78.1* 78.2* 78.9*  MCH 24.8* 24.9* 24.9* 24.9*  MCHC 31.5 31.8 31.9 31.5  RDW 19.0* 18.9* 18.9* 18.9*  LYMPHSABS  --  1.3 1.0 1.0  MONOABS  --  1.1* 1.1* 1.2*  EOSABS  --  0.4 0.4 0.5  BASOSABS  --  0.1 0.1 0.1    Recent Labs  Lab 08/18/19 2009 08/18/19 2017 08/19/19 0533 08/20/19 0420 08/21/19 0630 08/24/19 0249  NA  --   --  139 135 136 136  K  --   --  3.5 3.7 3.7 3.8  CL  --   --  97* 94* 95* 97*  CO2  --   --  27 24 26 24   GLUCOSE  --   --  113* 114* 132* 130*  BUN  --   --  38* 50* 28* 25*  CREATININE  --   --  7.53* 9.46* 6.80* 6.69*  CALCIUM  --   --  8.0* 8.3* 8.6* 9.0  AST  --   --   --  12* 12* 14*  ALT  --   --   --  10 6 7   ALKPHOS  --   --   --  48 49 48  BILITOT  --   --   --  0.8 0.6 1.0  ALBUMIN  --   --   --  2.9* 2.8* 2.8*  MG  --   --   --   --   --  1.9  CRP  --  0.7  --   --   --   --   HGBA1C 6.2*  --   --   --   --   --      Recent Labs  Lab 08/18/19 2017 08/22/19 0425  CRP 0.7  --   SARSCOV2NAA  --  NEGATIVE    ------------------------------------------------------------------------------------------------------------------ No results for input(s): CHOL, HDL, LDLCALC, TRIG, CHOLHDL, LDLDIRECT in the last 72 hours.  Lab Results  Component Value Date   HGBA1C 6.2 (H) 08/18/2019   ------------------------------------------------------------------------------------------------------------------ No results for input(s): TSH, T4TOTAL, T3FREE, THYROIDAB in the last 72 hours.  Invalid  input(s): FREET3 ------------------------------------------------------------------------------------------------------------------ No results for input(s): VITAMINB12, FOLATE, FERRITIN, TIBC, IRON, RETICCTPCT in the last 72 hours.  Coagulation profile No results for input(s): INR, PROTIME in the last 168 hours.  No results for input(s): DDIMER in the last 72 hours.  Cardiac Enzymes No results for input(s): CKMB, TROPONINI, MYOGLOBIN in the last 168 hours.  Invalid input(s): CK ------------------------------------------------------------------------------------------------------------------    Component Value Date/Time   BNP 1,371.0 (H) 08/13/2017 1147    Micro Results Recent Results (from the past 240 hour(s))  Culture, blood (routine x 2)     Status: None   Collection Time: 08/18/19  8:09 PM   Specimen: BLOOD LEFT HAND  Result Value Ref Range Status   Specimen Description BLOOD LEFT HAND  Final   Special Requests   Final    BOTTLES DRAWN AEROBIC AND ANAEROBIC Blood Culture adequate volume   Culture   Final    NO GROWTH 5 DAYS Performed at [facility], [place] [address], [place], [place] [address]    Report Status 08/23/2019 FINAL  Final  Culture, blood (routine x 2)     Status: None   Collection Time: 08/18/19  8:17 PM   Specimen: BLOOD LEFT HAND  Result Value Ref Range Status   Specimen  Description BLOOD LEFT HAND  Final   Special Requests   Final    BOTTLES DRAWN AEROBIC AND ANAEROBIC Blood Culture adequate volume   Culture   Final    NO GROWTH 5 DAYS Performed at [facility], [place] [address] [place], [place] [address]    Report Status 08/23/2019 FINAL  Final  MRSA PCR Screening     Status: None   Collection Time: 08/19/19  8:23 AM   Specimen: Nasal Mucosa; Nasopharyngeal  Result Value Ref Range Status   MRSA by PCR NEGATIVE NEGATIVE Final    Comment:        The GeneXpert MRSA Assay (FDA approved for NASAL specimens only), is one component of a comprehensive MRSA colonization surveillance program. It is not intended to diagnose MRSA infection nor to guide or monitor treatment for MRSA infections. Performed at [facility], [place] [address], [place], [place] [address]   SARS CORONAVIRUS 2 (TAT 6-24 HRS) Nasopharyngeal Nasopharyngeal Swab     Status: None   Collection Time: 08/22/19  4:25 AM   Specimen: Nasopharyngeal Swab  Result Value Ref Range Status   SARS Coronavirus 2 NEGATIVE NEGATIVE Final    Comment: (NOTE) SARS-CoV-2 target nucleic acids are NOT DETECTED. The SARS-CoV-2 RNA is generally detectable in upper and lower respiratory specimens during the acute phase of infection. Negative results do not preclude SARS-CoV-2 infection, do not rule out co-infections with other pathogens, and should not be used as the sole basis for treatment or other patient management decisions. Negative results must be combined with clinical observations, patient history, and epidemiological information. The expected result is Negative. Fact Sheet for Patients: SugarRoll.be Fact Sheet for Healthcare Providers: https://www.woods-mathews.com/ This test is not yet approved or cleared by the [place] FDA and  has been authorized for detection and/or diagnosis of SARS-CoV-2 by FDA under an Emergency Use Authorization  (EUA). This EUA will remain  in effect (meaning this test can be used) for the duration of the COVID-19 declaration under Section 56 4(b)(1) of the Act, 21 U.S.C. section 360bbb-3(b)(1), unless the authorization is terminated or revoked sooner. Performed at [facility], [place] [address], [place],  [address]  Radiology Reports DG Chest 1 View  Result Date: 08/18/2019 CLINICAL DATA:  [place]. EXAM: CHEST  1 VIEW COMPARISON:  May 29, 2019 FINDINGS: There is no evidence of acute infiltrate, pleural effusion or pneumothorax. There is stable marked severity enlargement of the cardiac silhouette. Multiple radiopaque surgical clips are seen along the left axilla. Degenerative changes seen throughout the thoracic spine. IMPRESSION: Stable cardiomegaly without evidence of acute or active cardiopulmonary disease. Electronically Signed   By: Virgina Norfolk M.D.   On: 08/18/2019 21:45   DG Ankle Complete Left  Result Date: 08/19/2019 CLINICAL DATA:  Pain and swelling. Osteomyelitis. EXAM: LEFT ANKLE COMPLETE - 3+ VIEW COMPARISON:  None. FINDINGS: There is no acute displaced fracture or dislocation. There is osteopenia which limits detection of nondisplaced fractures. There may be ulceration overlying the medial malleolus without definite evidence for underlying osteomyelitis. There is a moderate-sized Achilles tendon enthesophyte. There is a small plantar calcaneal spur. Advanced vascular calcifications are noted. IMPRESSION: 1. No acute displaced fracture or dislocation. 2. Possible ulceration overlying the medial malleolus without definite evidence for underlying osteomyelitis. 3. Advanced vascular calcifications. Electronically Signed   By: Constance Holster M.D.   On: 08/19/2019 19:56   DG Ankle Complete Right  Result Date: 08/19/2019 CLINICAL DATA:  Osteomyelitis of the ankle and foot. EXAM: RIGHT ANKLE - COMPLETE 3+ VIEW COMPARISON:  February 01, 2013 FINDINGS: There are advanced  degenerative changes of the ankle mortise with surrounding soft tissue swelling. There is no acute displaced fracture. No dislocation. Advanced degenerative changes are noted of the midfoot. There is a moderate-sized plantar calcaneal spur. There is an Achilles tendon enthesophyte. Vascular calcifications are noted. There is no radiographic evidence for osteomyelitis. IMPRESSION: 1. No acute osseous abnormality. 2. Advanced degenerative changes are noted of the midfoot and ankle mortise. Electronically Signed   By: Constance Holster M.D.   On: 08/19/2019 19:57   DG Abd 1 View  Result Date: 08/18/2019 CLINICAL DATA:  Vomiting, constipation EXAM: ABDOMEN - 1 VIEW COMPARISON:  05/12/2019 FINDINGS: There is a non obstructive bowel gas pattern. No supine evidence of free air. No organomegaly or suspicious calcification. No acute bony abnormality. Aortoiliac atherosclerosis. IMPRESSION: No acute findings. Electronically Signed   By: Rolm Baptise M.D.   On: 08/18/2019 21:40   PERIPHERAL VASCULAR CATHETERIZATION  Result Date: 08/22/2019 Patient name: CANDE MASTROPIETRO MRN: [id number] DOB: [birth year] Sex: female 08/22/2019 Pre-operative Diagnosis: Bilateral lower extremity ulcer Post-operative diagnosis:  Same Surgeon:  [name] Procedure Performed:  1.  Ultrasound-guided access, right femoral artery  2.  Ultrasound-guided access, left femoral artery  3.  Abdominal aortogram  4.  Bilateral lower extremity runoff  5.  Intravascular lithotripsy, right superficial femoral and popliteal artery  6.  Drug-eluting stent, right superficial femoral-popliteal artery  7.  Conscious sedation, 143 minutes Indications: The patient has extensive bilateral lower extremity ulcers.  She is here today for limb salvage. Procedure:  The patient was identified in the holding area and taken to room 8.  The patient was then placed supine on the table and prepped and draped in the usual sterile fashion.  A time out was called.  Conscious  sedation was administered with the use of IV fentanyl and Versed under continuous physician and nurse monitoring.  Heart rate, blood pressure, and oxygen saturation were continuously monitored.  Total sedation time was 143 minutes.  I initially tried to get access into the left common femoral artery, however I was unsuccessful therefore I turned my attention  towards the right femoral artery.  Ultrasound was used to evaluate the right common femoral artery.  It was patent .  A digital ultrasound image was acquired.  A micropuncture needle was used to access the right common femoral artery under ultrasound guidance.  An 018 wire was advanced without resistance and a micropuncture sheath was placed.  The 018 wire was removed and a benson wire was placed.  The micropuncture sheath was exchanged for a 5 french sheath.  An omniflush catheter was advanced over the wire to the level of L-1.  An abdominal angiogram was obtained.  Next, the cath was pulled out of the aortic bifurcation and bilateral runoff was performed.  I then followed up for the intervention by gaining access under ultrasound guidance of the left common femoral artery. Findings:  Aortogram: No significant infrarenal aortic stenosis.  Bilateral common and external iliac arteries are patent throughout the course however they are heavily calcified.   Right Lower Extremity:  The right common femoral and profundofemoral artery are heavily calcified but patent throughout their course.  The superficial femoral artery is diffusely diseased with multiple high-grade lesions down to the adductor canal where it occludes.  There is reconstitution of the popliteal artery just below the joint space.  There is two-vessel runoff via the peroneal and posterior tibial artery.  Left Lower Extremity: The common femoral and profundofemoral artery are patent without stenosis but heavily calcified.  The superficial femoral artery is occluded with reconstitution of the superficial  femoral artery at the adductor canal with diffusely diseased three-vessel runoff and minimal opacification of the digital arteries. Intervention: After the above images were acquired the decision made to proceed with intervention.  The sheath in the right groin was removed.  I tried to use a minx however this failed because the balloon popped.  Manual pressure was held for 10 minutes.  I then placed a 7 x 45 sheath from the left groin into the right external iliac artery and fully heparinized the patient.  Using a quick cross catheter and 035 Glidewire followed by a Glidewire advantage I was able to get across the stenosis.  The lesion was predilated with a 4 x 100 balloon.  I then performed shockwave intravascular ultrasound of the popliteal artery just distal to the joint space up to its origin.  A 5 x 60 balloon was used for the distal half and a 6 x 60 balloon was used for the proximal half.  Total treatment time was 510 seconds.  I then elected to stent the treated area.  A 5 x 100 Tigris stent was placed distally followed by Elluvia 6 x 120, 7 x 120, and 7 x 40.  The stents were then molded with a 5 mm balloon and completion imaging showed inline flow down across the foot.  Catheters and wires were removed.  A short 7 French sheath was placed.  Patient tolerated the procedure well there were no complications. 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  Successfully recanalized and treated with shockwave intravascular lithotripsy followed by drug-eluting stent placement from just below the joint space in the popliteal artery up to the origin of the superficial femoral artery.  There is two-vessel runoff via the peroneal and posterior tibial artery.  #2  Left superficial femoral artery occlusion  V. [name], M.D., [facility] [place] Office: [phone] Pager:  [phone]  DG Foot 2 Views Left  Result  Date: 08/19/2019 CLINICAL  DATA:  Osteomyelitis of the ankle or foot. EXAM: LEFT FOOT - 2 VIEW COMPARISON:  08/18/2019 FINDINGS: Again noted is a foreign body in the medial soft tissues of the left foot. There are degenerative changes of the interphalangeal joints. Advanced vascular calcifications are noted. There is a moderate-sized plantar calcaneal spur. There is no acute displaced fracture or dislocation. There is no definite radiographic evidence for osteomyelitis. IMPRESSION: 1. No definite radiographic evidence for osteomyelitis. 2. Stable foreign body in the medial soft tissues of the left foot. 3. Degenerative changes of the interphalangeal joints. Electronically Signed   By: Constance Holster M.D.   On: 08/19/2019 19:55   DG Foot Complete Right  Result Date: 08/19/2019 CLINICAL DATA:  Osteomyelitis. 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 There is a lucency at the head of the fifth metatarsal. There is some surrounding soft tissue swelling. Vascular calcifications are noted. There are degenerative changes of the midfoot. IMPRESSION: 1. No acute displaced fracture or dislocation. 2. 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 There is a persistent ill-defined lucency at the head of the fifth metatarsal which can be seen in patients with osteomyelitis. Electronically Signed   By: Constance Holster M.D.   On: 08/19/2019 19:59   VAS [place] ABI WITH/WO TBI  Result Date: 08/21/2019 LOWER EXTREMITY DOPPLER STUDY Indications: Gangrene, and peripheral artery disease. High Risk Factors: Diabetes.  Performing Technologist: June Leap Rvt, Rdms  Examination Guidelines: A complete evaluation includes at minimum, Doppler waveform signals and systolic blood pressure reading at the level of bilateral brachial, anterior tibial, and posterior tibial arteries, when vessel segments are accessible. Bilateral testing is  considered an integral part of a complete examination. Photoelectric Plethysmograph (PPG) waveforms and toe systolic pressure readings are included as required and additional duplex testing as needed. Limited examinations for reoccurring indications may be performed as noted.  ABI Findings: +---------+-----------------+-----+------------------+-------------------------+ Right    Rt Pressure      IndexWaveform          Comment                            (mmHg)                                                            +---------+-----------------+-----+------------------+-------------------------+ Brachial                                         BP not taken-restricted                                                    arm                       +---------+-----------------+-----+------------------+-------------------------+ ATA      206              1.02 dampened  monophasic                                  +---------+-----------------+-----+------------------+-------------------------+ PTA      147              0.73 dampened                                                                   monophasic                                  +---------+-----------------+-----+------------------+-------------------------+ Great Toe                      Abnormal                                    +---------+-----------------+-----+------------------+-------------------------+ +---------+------------------+-----+-------------------+-------+ Left     Lt Pressure (mmHg)IndexWaveform           Comment +---------+------------------+-----+-------------------+-------+ Brachial 201                    triphasic                  +---------+------------------+-----+-------------------+-------+ ATA      97                0.48 dampened monophasic         +---------+------------------+-----+-------------------+-------+ PTA      19                0.09 dampened monophasic        +---------+------------------+-----+-------------------+-------+ Great Toe                       Abnormal                   +---------+------------------+-----+-------------------+-------+ Great toe PPG waveform too dampened to obtain pressures bilaterally.  Summary: Right: ABI is within normal range by ATA pressure, however abnormal pedal artery waveforms suggest this is falsely elevated. ABI is in the moderate range based on PTA pressure. Severely dampened PPG waveform of Great toe. Left: Resting left ankle-brachial index indicates severe left lower extremity arterial disease. Severely dampened PPG waveform of Great toe.  *See table(s) above for measurements and observations.  Electronically signed by Servando Snare MD on 08/21/2019 at 5:13:35 PM.    Final     Time Spent in minutes  30   Lala Lund M.D on 08/24/2019 at 10:52 AM  To page go to [email or web address] - password [email or web address] [facility]

## 2019-08-25 ENCOUNTER — Inpatient Hospital Stay (HOSPITAL_COMMUNITY): Payer: Medicare Other | Admitting: Certified Registered Nurse Anesthetist

## 2019-08-25 ENCOUNTER — Encounter (HOSPITAL_COMMUNITY): Payer: Self-pay | Admitting: Internal Medicine

## 2019-08-25 ENCOUNTER — Encounter (HOSPITAL_COMMUNITY)
Admission: AD | Disposition: A | Discharge status: Skilled Nursing Facility | Payer: Self-pay | Source: Other Acute Inpatient Hospital | Attending: Internal Medicine

## 2019-08-25 DIAGNOSIS — I739 Peripheral vascular disease, unspecified: Secondary | ICD-10-CM | POA: Diagnosis not present

## 2019-08-25 DIAGNOSIS — I96 Gangrene, not elsewhere classified: Secondary | ICD-10-CM | POA: Diagnosis not present

## 2019-08-25 DIAGNOSIS — M86472 Chronic osteomyelitis with draining sinus, left ankle and foot: Secondary | ICD-10-CM | POA: Diagnosis not present

## 2019-08-25 DIAGNOSIS — N186 End stage renal disease: Secondary | ICD-10-CM | POA: Diagnosis not present

## 2019-08-25 DIAGNOSIS — E11621 Type 2 diabetes mellitus with foot ulcer: Secondary | ICD-10-CM | POA: Diagnosis not present

## 2019-08-25 DIAGNOSIS — M86172 Other acute osteomyelitis, left ankle and foot: Secondary | ICD-10-CM | POA: Diagnosis not present

## 2019-08-25 DIAGNOSIS — M869 Osteomyelitis, unspecified: Secondary | ICD-10-CM | POA: Diagnosis not present

## 2019-08-25 HISTORY — PX: AMPUTATION: SHX166

## 2019-08-25 LAB — BASIC METABOLIC PANEL
Anion gap: 13 (ref 5–15)
BUN: 32 mg/dL — ABNORMAL HIGH (ref 8–23)
CO2: 24 mmol/L (ref 22–32)
Calcium: 8.6 mg/dL — ABNORMAL LOW (ref 8.9–10.3)
Chloride: 100 mmol/L (ref 98–111)
Creatinine, Ser: 7.92 mg/dL — ABNORMAL HIGH (ref 0.44–1.00)
GFR calc Af Amer: 5 mL/min — ABNORMAL LOW (ref >60)
GFR calc non Af Amer: 5 mL/min — ABNORMAL LOW (ref >60)
Glucose, Bld: 151 mg/dL — ABNORMAL HIGH (ref 70–99)
Potassium: 3.5 mmol/L (ref 3.5–5.1)
Sodium: 137 mmol/L (ref 135–145)

## 2019-08-25 LAB — RENAL FUNCTION PANEL
Albumin: 2.4 g/dL — ABNORMAL LOW (ref 3.5–5.0)
Anion gap: 15 (ref 5–15)
BUN: 36 mg/dL — ABNORMAL HIGH (ref 8–23)
CO2: 23 mmol/L (ref 22–32)
Calcium: 9 mg/dL (ref 8.9–10.3)
Chloride: 98 mmol/L (ref 98–111)
Creatinine, Ser: 8.71 mg/dL — ABNORMAL HIGH (ref 0.44–1.00)
GFR calc Af Amer: 5 mL/min — ABNORMAL LOW (ref >60)
GFR calc non Af Amer: 4 mL/min — ABNORMAL LOW (ref >60)
Glucose, Bld: 189 mg/dL — ABNORMAL HIGH (ref 70–99)
Phosphorus: 5.5 mg/dL — ABNORMAL HIGH (ref 2.5–4.6)
Potassium: 4.7 mmol/L (ref 3.5–5.1)
Sodium: 136 mmol/L (ref 135–145)

## 2019-08-25 LAB — CBC WITH DIFFERENTIAL/PLATELET
Abs Immature Granulocytes: 0.03 10*3/uL (ref 0.00–0.07)
Basophils Absolute: 0.1 10*3/uL (ref 0.0–0.1)
Basophils Relative: 1 %
Eosinophils Absolute: 0.5 10*3/uL (ref 0.0–0.5)
Eosinophils Relative: 6 %
HCT: 25.9 % — ABNORMAL LOW (ref 36.0–46.0)
Hemoglobin: 8.2 g/dL — ABNORMAL LOW (ref 12.0–15.0)
Immature Granulocytes: 0 %
Lymphocytes Relative: 11 %
Lymphs Abs: 1 10*3/uL (ref 0.7–4.0)
MCH: 24.9 pg — ABNORMAL LOW (ref 26.0–34.0)
MCHC: 31.7 g/dL (ref 30.0–36.0)
MCV: 78.7 fL — ABNORMAL LOW (ref 80.0–100.0)
Monocytes Absolute: 1 10*3/uL (ref 0.1–1.0)
Monocytes Relative: 11 %
Neutro Abs: 6.5 10*3/uL (ref 1.7–7.7)
Neutrophils Relative %: 71 %
Platelets: 185 10*3/uL (ref 150–400)
RBC: 3.29 MIL/uL — ABNORMAL LOW (ref 3.87–5.11)
RDW: 18.7 % — ABNORMAL HIGH (ref 11.5–15.5)
WBC: 9.1 10*3/uL (ref 4.0–10.5)
nRBC: 0 % (ref 0.0–0.2)

## 2019-08-25 LAB — TYPE AND SCREEN
ABO/RH(D): A POS
Antibody Screen: NEGATIVE

## 2019-08-25 LAB — GLUCOSE, CAPILLARY
Glucose-Capillary: 162 mg/dL — ABNORMAL HIGH (ref 70–99)
Glucose-Capillary: 140 mg/dL — ABNORMAL HIGH (ref 70–99)
Glucose-Capillary: 128 mg/dL — ABNORMAL HIGH (ref 70–99)
Glucose-Capillary: 154 mg/dL — ABNORMAL HIGH (ref 70–99)
Glucose-Capillary: 176 mg/dL — ABNORMAL HIGH (ref 70–99)
Glucose-Capillary: 135 mg/dL — ABNORMAL HIGH (ref 70–99)
Glucose-Capillary: 141 mg/dL — ABNORMAL HIGH (ref 70–99)

## 2019-08-25 LAB — ABO/RH: ABO/RH(D): A POS

## 2019-08-25 LAB — CBC
HCT: 26.8 % — ABNORMAL LOW (ref 36.0–46.0)
Hemoglobin: 8.5 g/dL — ABNORMAL LOW (ref 12.0–15.0)
MCH: 24.9 pg — ABNORMAL LOW (ref 26.0–34.0)
MCHC: 31.7 g/dL (ref 30.0–36.0)
MCV: 78.6 fL — ABNORMAL LOW (ref 80.0–100.0)
Platelets: 201 10*3/uL (ref 150–400)
RBC: 3.41 MIL/uL — ABNORMAL LOW (ref 3.87–5.11)
RDW: 18.7 % — ABNORMAL HIGH (ref 11.5–15.5)
WBC: 10.5 10*3/uL (ref 4.0–10.5)
nRBC: 0 % (ref 0.0–0.2)

## 2019-08-25 LAB — PROTIME-INR
INR: 1.3 — ABNORMAL HIGH (ref 0.8–1.2)
Prothrombin Time: 16.2 seconds — ABNORMAL HIGH (ref 11.4–15.2)

## 2019-08-25 LAB — MAGNESIUM: Magnesium: 1.8 mg/dL (ref 1.7–2.4)

## 2019-08-25 LAB — C-REACTIVE PROTEIN: CRP: 11.5 mg/dL — ABNORMAL HIGH (ref <1.0)

## 2019-08-25 LAB — VANCOMYCIN, RANDOM: Vancomycin Rm: 18

## 2019-08-25 LAB — HEPATITIS B SURFACE ANTIGEN: Hepatitis B Surface Ag: NONREACTIVE

## 2019-08-25 SURGERY — AMPUTATION, ABOVE KNEE
Anesthesia: General | Site: Knee | Laterality: Left

## 2019-08-25 MED ORDER — METOCLOPRAMIDE HCL 5 MG/ML IJ SOLN: 5.0000 mg | Freq: Three times a day (TID) | INTRAMUSCULAR | Status: DC | PRN

## 2019-08-25 MED ORDER — ONDANSETRON HCL 4 MG PO TABS: 4.0000 mg | ORAL_TABLET | Freq: Four times a day (QID) | ORAL | Status: DC | PRN

## 2019-08-25 MED ORDER — METHOCARBAMOL 500 MG PO TABS: 500.0000 mg | ORAL_TABLET | Freq: Four times a day (QID) | ORAL | Status: DC | PRN

## 2019-08-25 MED ORDER — HEPARIN (PORCINE) 25000 UT/250ML-% IV SOLN
1100.0000 [IU]/h | INTRAVENOUS | Status: DC
  Administered 2019-08-25: 950 [IU]/h via INTRAVENOUS
  Filled 2019-08-25 (×3): qty 250

## 2019-08-25 MED ORDER — OXYCODONE HCL 5 MG PO TABS: 5.0000 mg | ORAL_TABLET | ORAL | Status: DC | PRN

## 2019-08-25 MED ORDER — ACETAMINOPHEN 325 MG PO TABS: 325.0000 mg | ORAL_TABLET | Freq: Four times a day (QID) | ORAL | Status: DC | PRN

## 2019-08-25 MED ORDER — OXYCODONE HCL 5 MG PO TABS: 10.0000 mg | ORAL_TABLET | ORAL | Status: DC | PRN

## 2019-08-25 MED ORDER — FENTANYL CITRATE (PF) 100 MCG/2ML IJ SOLN: 25.0000 ug | INTRAMUSCULAR | Status: DC | PRN

## 2019-08-25 MED ORDER — MAGNESIUM CITRATE PO SOLN: 1.0000 | Freq: Once | ORAL | Status: DC | PRN

## 2019-08-25 MED ORDER — METOCLOPRAMIDE HCL 5 MG PO TABS: 5.0000 mg | ORAL_TABLET | Freq: Three times a day (TID) | ORAL | Status: DC | PRN

## 2019-08-25 MED ORDER — PROPOFOL 10 MG/ML IV BOLUS
INTRAVENOUS | Status: DC | PRN
  Administered 2019-08-25: 80 mg via INTRAVENOUS

## 2019-08-25 MED ORDER — POVIDONE-IODINE 10 % EX SWAB: 2.0000 "application " | Freq: Once | CUTANEOUS | Status: DC

## 2019-08-25 MED ORDER — HEPARIN (PORCINE) 25000 UT/250ML-% IV SOLN: 950.0000 [IU]/h | INTRAVENOUS | Status: DC

## 2019-08-25 MED ORDER — OXYCODONE HCL 5 MG PO TABS: 5.0000 mg | ORAL_TABLET | Freq: Once | ORAL | Status: DC | PRN

## 2019-08-25 MED ORDER — NEPRO/CARBSTEADY PO LIQD
237.0000 mL | Freq: Two times a day (BID) | ORAL | Status: DC
  Filled 2019-08-25: qty 237

## 2019-08-25 MED ORDER — OXYCODONE HCL 5 MG PO TABS
ORAL_TABLET | ORAL | Status: AC
  Administered 2019-08-25: 10 mg via ORAL
  Filled 2019-08-25: qty 2

## 2019-08-25 MED ORDER — CEFAZOLIN SODIUM-DEXTROSE 2-3 GM-%(50ML) IV SOLR
INTRAVENOUS | Status: DC | PRN
  Administered 2019-08-25: 2 g via INTRAVENOUS

## 2019-08-25 MED ORDER — HYDROMORPHONE HCL 1 MG/ML IJ SOLN
0.5000 mg | INTRAMUSCULAR | Status: DC | PRN
  Administered 2019-08-25: 1 mg via INTRAVENOUS
  Filled 2019-08-25: qty 1

## 2019-08-25 MED ORDER — LORAZEPAM 2 MG/ML IJ SOLN
INTRAMUSCULAR | Status: AC
  Administered 2019-08-25: 0.25 mg via INTRAVENOUS
  Filled 2019-08-25: qty 1

## 2019-08-25 MED ORDER — PHENYLEPHRINE HCL-NACL 10-0.9 MG/250ML-% IV SOLN
INTRAVENOUS | Status: DC | PRN
  Administered 2019-08-25: 25 ug/min via INTRAVENOUS

## 2019-08-25 MED ORDER — PROPOFOL 10 MG/ML IV BOLUS
INTRAVENOUS | Status: AC
  Filled 2019-08-25: qty 20

## 2019-08-25 MED ORDER — OXYCODONE HCL 5 MG/5ML PO SOLN: 5.0000 mg | Freq: Once | ORAL | Status: DC | PRN

## 2019-08-25 MED ORDER — DEXAMETHASONE SODIUM PHOSPHATE 4 MG/ML IJ SOLN
INTRAMUSCULAR | Status: DC | PRN
  Administered 2019-08-25: 4 mg via INTRAVENOUS

## 2019-08-25 MED ORDER — SODIUM CHLORIDE 0.9 % IV SOLN: INTRAVENOUS | Status: DC

## 2019-08-25 MED ORDER — POLYETHYLENE GLYCOL 3350 17 G PO PACK: 17.0000 g | PACK | Freq: Every day | ORAL | Status: DC | PRN

## 2019-08-25 MED ORDER — ONDANSETRON HCL 4 MG/2ML IJ SOLN
INTRAMUSCULAR | Status: DC | PRN
  Administered 2019-08-25: 4 mg via INTRAVENOUS

## 2019-08-25 MED ORDER — 0.9 % SODIUM CHLORIDE (POUR BTL) OPTIME
TOPICAL | Status: DC | PRN
  Administered 2019-08-25: 1000 mL

## 2019-08-25 MED ORDER — LIDOCAINE HCL (CARDIAC) PF 100 MG/5ML IV SOSY
PREFILLED_SYRINGE | INTRAVENOUS | Status: DC | PRN
  Administered 2019-08-25: 60 mg via INTRAVENOUS

## 2019-08-25 MED ORDER — CEFAZOLIN SODIUM-DEXTROSE 2-4 GM/100ML-% IV SOLN
2.0000 g | INTRAVENOUS | Status: DC
  Filled 2019-08-25: qty 100

## 2019-08-25 MED ORDER — BISACODYL 10 MG RE SUPP: 10.0000 mg | Freq: Every day | RECTAL | Status: DC | PRN

## 2019-08-25 MED ORDER — FENTANYL CITRATE (PF) 250 MCG/5ML IJ SOLN
INTRAMUSCULAR | Status: AC
  Filled 2019-08-25: qty 5

## 2019-08-25 MED ORDER — ONDANSETRON HCL 4 MG/2ML IJ SOLN: 4.0000 mg | Freq: Four times a day (QID) | INTRAMUSCULAR | Status: DC | PRN

## 2019-08-25 MED ORDER — LORAZEPAM 2 MG/ML IJ SOLN: 0.2500 mg | Freq: Once | INTRAMUSCULAR | Status: AC

## 2019-08-25 MED ORDER — METHOCARBAMOL 1000 MG/10ML IJ SOLN
500.0000 mg | Freq: Four times a day (QID) | INTRAVENOUS | Status: DC | PRN
  Filled 2019-08-25: qty 5

## 2019-08-25 MED ORDER — DOCUSATE SODIUM 100 MG PO CAPS
100.0000 mg | ORAL_CAPSULE | Freq: Two times a day (BID) | ORAL | Status: DC
  Administered 2019-08-26 – 2019-08-30 (×5): 100 mg via ORAL
  Filled 2019-08-25 (×8): qty 1

## 2019-08-25 MED ORDER — FENTANYL CITRATE (PF) 100 MCG/2ML IJ SOLN
INTRAMUSCULAR | Status: DC | PRN
  Administered 2019-08-25 (×3): 50 ug via INTRAVENOUS

## 2019-08-25 MED ORDER — CHLORHEXIDINE GLUCONATE 4 % EX LIQD: 60.0000 mL | Freq: Once | CUTANEOUS | Status: DC

## 2019-08-25 SURGICAL SUPPLY — 41 items
BLADE SAW RECIP 87.9 MT (BLADE) ×3 IMPLANT
BLADE SURG 21 STRL SS (BLADE) ×2 IMPLANT
BNDG COHESIVE 6X5 TAN STRL LF (GAUZE/BANDAGES/DRESSINGS) ×2 IMPLANT
CANISTER WOUND CARE 500ML ATS (WOUND CARE) IMPLANT
COVER SURGICAL LIGHT HANDLE (MISCELLANEOUS) ×2 IMPLANT
COVER WAND RF STERILE (DRAPES) IMPLANT
CUFF TOURN SGL QUICK 34 (TOURNIQUET CUFF)
CUFF TRNQT CYL 34X4.125X (TOURNIQUET CUFF) IMPLANT
DRAPE INCISE IOBAN 66X45 STRL (DRAPES) ×4 IMPLANT
DRAPE U-SHAPE 47X51 STRL (DRAPES) ×2 IMPLANT
DRESSING PREVENA PLUS CUSTOM (GAUZE/BANDAGES/DRESSINGS) ×1 IMPLANT
DRSG PREVENA PLUS CUSTOM (GAUZE/BANDAGES/DRESSINGS) ×2
DURAPREP 26ML APPLICATOR (WOUND CARE) ×2 IMPLANT
ELECT REM PT RETURN 9FT ADLT (ELECTROSURGICAL) ×2
ELECTRODE REM PT RTRN 9FT ADLT (ELECTROSURGICAL) ×1 IMPLANT
GLOVE BIOGEL PI IND STRL 7.5 (GLOVE) ×1 IMPLANT
GLOVE BIOGEL PI IND STRL 9 (GLOVE) ×1 IMPLANT
GLOVE BIOGEL PI INDICATOR 7.5 (GLOVE) ×1
GLOVE BIOGEL PI INDICATOR 9 (GLOVE) ×1
GLOVE SURG ORTHO 9.0 STRL STRW (GLOVE) ×2 IMPLANT
GLOVE SURG SS PI 6.5 STRL IVOR (GLOVE) ×2 IMPLANT
GOWN STRL REUS W/ TWL LRG LVL3 (GOWN DISPOSABLE) ×1 IMPLANT
GOWN STRL REUS W/ TWL XL LVL3 (GOWN DISPOSABLE) ×2 IMPLANT
GOWN STRL REUS W/TWL LRG LVL3 (GOWN DISPOSABLE) ×2
GOWN STRL REUS W/TWL XL LVL3 (GOWN DISPOSABLE) ×4
KIT BASIN OR (CUSTOM PROCEDURE TRAY) ×2 IMPLANT
KIT TURNOVER KIT B (KITS) ×2 IMPLANT
MANIFOLD NEPTUNE II (INSTRUMENTS) ×2 IMPLANT
NS IRRIG 1000ML POUR BTL (IV SOLUTION) ×2 IMPLANT
PACK ORTHO EXTREMITY (CUSTOM PROCEDURE TRAY) ×2 IMPLANT
PAD ARMBOARD 7.5X6 YLW CONV (MISCELLANEOUS) ×2 IMPLANT
PREVENA RESTOR ARTHOFORM 46X30 (CANNISTER) ×2 IMPLANT
SPONGE LAP 18X18 RF (DISPOSABLE) ×1 IMPLANT
STAPLER VISISTAT 35W (STAPLE) IMPLANT
STOCKINETTE IMPERVIOUS LG (DRAPES) IMPLANT
SUT ETHILON 2 0 PSLX (SUTURE) ×4 IMPLANT
SUT SILK 2 0 (SUTURE) ×2
SUT SILK 2-0 18XBRD TIE 12 (SUTURE) ×1 IMPLANT
TOWEL GREEN STERILE FF (TOWEL DISPOSABLE) ×2 IMPLANT
TUBE CONNECTING 20X1/4 (TUBING) ×2 IMPLANT
YANKAUER SUCT BULB TIP NO VENT (SUCTIONS) ×2 IMPLANT

## 2019-08-25 NOTE — Progress Notes (Signed)
Subjective:  Seen in room  ,post op L AKA  this am ,lethargic and no cos   Objective Vital signs in last 24 hours: Vitals:   08/25/19 0943 08/25/19 1200 08/25/19 1313 08/25/19 1320  BP: (!) 121/52  136/65 129/63  Pulse: 71  77 76  Resp: 16   16  Temp: 97.7 F (36.5 C)   98 F (36.7 C)  TempSrc: Oral   Oral  SpO2: 94%   96%  Weight:  100.7 kg  96.1 kg  Height:  5\' 6"  (1.676 m)     Weight change:   Physical Exam: General: Obese, AAF ,NAD  Heart: RRR  Lungs: clear bilaterally  Abdomen: soft non-tender Extremities: no sig L AKA with wound vac , trace R LE edema  Dialysis Access: RUE AVG +bruit    OP Dialysis Orders: [facility] 4.25 hr 400/600 96 kg 2.0 K/2.5 Ca R AVG -Heparin 2000 units IV initial bolus then 1000 units/hr. Stop 1 hour before end of treatment -Epogen 5400 units IV TIW -Hectorol 2.5 mcg IV TIW -Venofer 50 mg IV weekly  Problem/Plan: 1. PAD/Bilateral foot ulcers/osteomyelitis. BCx NGTD. On vanc./ Maxipime  S/p aortogram w lithotripsy/stent RLE per [facility] on 4/9 2. ESRD - HD TTS.HD today postponed  d/t heavy pt census/staffing  3. Hypertension/volume -BP ok. On Midodrine for BP support. No evidence of volume excess. Is under OP EDW UF as tolerated.  4. Anemia -HGB 8.5 Aranesp 60 mcg IV with HD 4/6.  5. Metabolic bone disease - Ca /phos ok. Calcitriol and hectorol both on OP med list. . DC oral calcitriol. Continue Auryxia 210 mg PO TID AC 1 tab with snack. 6. Nutrition - alb 2.8 nepro protein supplement Renal/Carb mod diet.  7. DM- per primary.    Ernest Haber, PA-C [facility] [phone] 08/25/2019,2:22 PM  LOS: 7 days   Labs: Basic Metabolic Panel: Recent Labs  Lab 08/21/19 0630 08/24/19 0249 08/25/19 0323  NA 136 136 137  K 3.7 3.8 3.5  CL 95* 97* 100  CO2 26 24 24   GLUCOSE 132* 130* 151*  BUN 28* 25* 32*  CREATININE 6.80* 6.69* 7.92*  CALCIUM 8.6* 9.0 8.6*  PHOS  --  3.9  --    Liver  Function Tests: Recent Labs  Lab 08/20/19 0420 08/21/19 0630 08/24/19 0249  AST 12* 12* 14*  ALT 10 6 7   ALKPHOS 48 49 48  BILITOT 0.8 0.6 1.0  PROT 7.7 7.8 7.8  ALBUMIN 2.9* 2.8* 2.8*   No results for input(s): LIPASE, AMYLASE in the last 168 hours. No results for input(s): AMMONIA in the last 168 hours. CBC: Recent Labs  Lab 08/20/19 0420 08/20/19 0420 08/21/19 0630 08/21/19 0630 08/24/19 0249 08/25/19 0323 08/25/19 1405  WBC 8.7   < > 8.3   < > 9.1 9.1 10.5  NEUTROABS 5.9   < > 5.8  --  6.2 6.5  --   HGB 9.2*   < > 9.6*   < > 9.2* 8.2* 8.5*  HCT 28.9*   < > 30.1*   < > 29.2* 25.9* 26.8*  MCV 78.1*  --  78.2*  --  78.9* 78.7* 78.6*  PLT 227   < > 224   < > 199 185 201   < > = values in this interval not displayed.   Cardiac Enzymes: No results for input(s): CKTOTAL, CKMB, CKMBINDEX, TROPONINI in the last 168 hours. CBG: Recent Labs  Lab 08/24/19 1559 08/25/19 3536  08/25/19 0846 08/25/19 0957 08/25/19 1221  GLUCAP 169* 140* 128* 154* 176*    Studies/Results: No results found. Medications: . sodium chloride 10 mL/hr at 08/22/19 0906  . sodium chloride 10 mL/hr at 08/25/19 0702  . sodium chloride    . ceFEPime (MAXIPIME) IV 1 g (08/24/19 2038)  . heparin    . methocarbamol (ROBAXIN) IV    . [START ON 08/27/2019] vancomycin    . vancomycin     . amiodarone  200 mg Oral Daily  . aspirin EC  81 mg Oral Daily  . atorvastatin  80 mg Oral Daily  . clopidogrel  75 mg Oral Q breakfast  . collagenase   Topical Daily  . darbepoetin (ARANESP) injection - DIALYSIS  60 mcg Intravenous Q Tue-HD  . docusate sodium  100 mg Oral QODAY  . docusate sodium  100 mg Oral BID  . doxercalciferol  3 mcg Intravenous Q T,Th,Sa-HD  . ferric citrate  420 mg Oral TID WC  . insulin aspart  0-5 Units Subcutaneous QHS  . insulin aspart  0-9 Units Subcutaneous TID WC  . LORazepam  0.25 mg Intravenous Once  . midodrine  5 mg Oral TID WC  . multivitamin  1 tablet Oral QHS  .  QUEtiapine  25 mg Oral QHS

## 2019-08-25 NOTE — Interval H&P Note (Signed)
History and Physical Interval Note:  08/25/2019 6:24 AM  Alexis [name]  has presented today for surgery, with the diagnosis of OSTEOMYLITIS.  The various methods of treatment have been discussed with the patient and family. 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  The patient's history has been reviewed, patient examined, no change in status, stable for surgery.  I have reviewed the patient's chart and labs.  Questions were answered to the patient's satisfaction.     Newt Minion

## 2019-08-25 NOTE — Progress Notes (Signed)
Pt received from dialysis. Bed weight 98.6kg. total removed was 1.7L Vitals stable. Pt placed on tele on monitored.  Jerald Kief, RN

## 2019-08-25 NOTE — Progress Notes (Signed)
Received call from OR asking for report on Alexis [name]  For surgery this morning. Advised OR patient has no orders for surgery and no consent, nor can patient consent because she is confused only oriented to self. Transport team was advised of the same and was told Dr. Sharol Given will address once he arrives.

## 2019-08-25 NOTE — Progress Notes (Signed)
PROGRESS NOTE                                                                                                                                                                                                             Patient Demographics:    Alexis [name], is a 74 y.o. female, DOB - 11/07/1945, XVQ:[id number]  Admit date - 08/18/2019   Admitting Physician Alexis Leff, MD  Outpatient Primary MD for the patient is System, Provider Not In  LOS - 7  No chief complaint on file.      Brief Narrative  Alexis [name] a 74 y.o.femalewith medical history significant ofA. fib on Eliquis, ESRD on HD TTS, CAD, pulmonary hypertension, LBBB, hypertension, hyperlipidemia, CHF, type 2 diabetes, diabetic neuropathy, CV[facility] with complaints of bilateral heel ulcers. Diagnosed with osteomyelitis of the right fifth metatarsal head and transferred to [facility] for further management as she is a dialysis patient.   Subjective:   Patient in bed, appears comfortable, denies any headache, no fever, no chest pain or pressure, no shortness of breath , no abdominal pain. No focal weakness.   Assessment  & Plan :     Diabetic foot ulcers/concern for osteomyelitis: Also neuropathic and vascular ulcers.  Left heel  osteomyelitis/calcaneal osteomyelitis, Right distal fibula osteomyelitis. She underwent revascularization this admission on 08/22/2019 by vascular surgery.  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  Monitor postop H&H, commence PT OT, most likely will require SNF.  Paroxysmal atrial fibrillation: Alexis [name] vas 2 score of at least 3.  Continue amiodarone, postop we will start heparin drip and  monitor.  Anemia of chronic disease.  Hemoglobin stable.  ESRD on HD: Receiving dialysis on schedule as per nephrology. TTS.  Dementia with hospital-acquired delirium due to unfamiliar setting along with toxic encephalopathy due to infection.  Supportive care, minimize narcotics and benzodiazepines, family made aware that her delirium might get worse, as needed Haldol and nighttime Seroquel.    Insulin-dependent type 2 diabetes: Takes 70/30 insulin at home.  Blood sugars well controlled currently on sliding scale insulin.  Lab Results  Component Value Date   HGBA1C 6.2 (H) 08/18/2019   CBG (last 3)  Recent Labs    08/25/19  4709 08/25/19 0846 08/25/19 0957  GLUCAP 140* 128* 154*      Family Communication  :  Daughter Alexis [name] [phone], 08/24/19, Husband Alexis [name] on [phone], 08/24/19.  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  Consults  :  Ortho, Renal  Procedures  :    Left AKA on 08/25/2019 by Dr. Sharol [name].    R Leg - revascularization 08/22/2019 - " Intravascular lithotripsy, right superficial femoral and popliteal artery and Drug-eluting stent, right superficial femoral-popliteal artery" Continue to hold anticoagulation for anticipated surgical procedure.  Further management per orthopedics if planning any debridements.   DVT Prophylaxis  :  Heparin gtt Eliquis once stable from surgical standpoint.    Lab Results  Component Value Date   PLT 185 08/25/2019    Diet :  Diet Order            Diet renal with fluid restriction Fluid restriction: 1200 mL Fluid; Room service appropriate? Yes; Fluid consistency: Thin  Diet effective now               Inpatient Medications Scheduled Meds: . amiodarone  200 mg Oral Daily  . aspirin EC  81 mg Oral Daily  . atorvastatin  80 mg Oral Daily  . clopidogrel  75 mg Oral Q breakfast  .  collagenase   Topical Daily  . darbepoetin (ARANESP) injection - DIALYSIS  60 mcg Intravenous Q Tue-HD  . docusate sodium  100 mg Oral QODAY  . docusate sodium  100 mg Oral BID  . doxercalciferol  3 mcg Intravenous Q T,Th,Sa-HD  . ferric citrate  420 mg Oral TID WC  . heparin injection (subcutaneous)  5,000 Units Subcutaneous Q8H  . insulin aspart  0-5 Units Subcutaneous QHS  . insulin aspart  0-9 Units Subcutaneous TID WC  . midodrine  5 mg Oral TID WC  . multivitamin  1 tablet Oral QHS  . QUEtiapine  25 mg Oral QHS   Continuous Infusions: . sodium chloride 10 mL/hr at 08/22/19 0906  . sodium chloride 10 mL/hr at 08/25/19 0702  . sodium chloride    . ceFEPime (MAXIPIME) IV 1 g (08/24/19 2038)  . methocarbamol (ROBAXIN) IV    . [START ON 08/27/2019] vancomycin    . vancomycin     PRN Meds:.[START ON 08/26/2019] acetaminophen, albuterol, bisacodyl, haloperidol lactate, hydrALAZINE, HYDROmorphone (DILAUDID) injection, labetalol, magnesium citrate, methocarbamol **OR** methocarbamol (ROBAXIN) IV, metoCLOPramide **OR** metoCLOPramide (REGLAN) injection, nitroGLYCERIN, ondansetron (ZOFRAN) IV, ondansetron **OR** ondansetron (ZOFRAN) IV, oxyCODONE, oxyCODONE, polyethylene glycol  Antibiotics  :   Anti-infectives (From admission, onward)   Start     Dose/Rate Route Frequency Ordered Stop   08/27/19 1200  vancomycin (VANCOCIN) IVPB 1000 mg/200 mL premix     1,000 mg 200 mL/hr over 60 Minutes Intravenous Every T-Th-Sa (Hemodialysis) 08/24/19 1135     08/25/19 0700  ceFAZolin (ANCEF) IVPB 2g/100 mL premix  Status:  Discontinued     2 g 200 mL/hr over 30 Minutes Intravenous On call to O.R. 08/25/19 0645 08/25/19 0945   08/24/19 1200  vancomycin (VANCOCIN) IVPB 750 mg/150 ml premix     750 mg 150 mL/hr over 60 Minutes Intravenous Every T-Th-Sa (Hemodialysis) 08/24/19 1135 08/27/19 1159   08/22/19 1824  vancomycin (VANCOCIN) 1-5 GM/200ML-% IVPB    Note to Pharmacy: Alexis [name]  : cabinet  override  08/22/19 1824 08/23/19 0629   08/20/19 1200  vancomycin (VANCOCIN) IVPB 1000 mg/200 mL premix  Status:  Discontinued     1,000 mg 200 mL/hr over 60 Minutes Intravenous Every T-Th-Sa (Hemodialysis) 08/19/19 1501 08/24/19 1135   08/19/19 2000  ceFEPIme (MAXIPIME) 1 g in sodium chloride 0.9 % 100 mL IVPB     1 g 200 mL/hr over 30 Minutes Intravenous Every 24 hours 08/18/19 2000     08/18/19 2015  ceFEPIme (MAXIPIME) 2 g in sodium chloride 0.9 % 100 mL IVPB     2 g 200 mL/hr over 30 Minutes Intravenous  Once 08/18/19 2000 08/18/19 2139   08/18/19 2015  vancomycin (VANCOREADY) IVPB 500 mg/100 mL     500 mg 100 mL/hr over 60 Minutes Intravenous  Once 08/18/19 2000 08/18/19 2339   08/18/19 2000  vancomycin variable dose per unstable renal function (pharmacist dosing)  Status:  Discontinued      Does not apply See admin instructions 08/18/19 2000 08/19/19 1501          Objective:   Vitals:   08/25/19 0900 08/25/19 0915 08/25/19 0930 08/25/19 0943  BP: (!) 92/46 107/64 118/72 (!) 121/52  Pulse: 71 74 72 71  Resp: 15 14 17 16   Temp:  98.1 F (36.7 C) 98.2 F (36.8 C) 97.7 F (36.5 C)  TempSrc:    Oral  SpO2: 99% 96% 96% 94%  Weight:        SpO2: 94 % O2 Flow Rate (L/min): 4 L/min  Wt Readings from Last 3 Encounters:  08/23/19 100.7 kg  07/22/19 104.3 kg  06/11/19 98.4 kg     Intake/Output Summary (Last 24 hours) at 08/25/2019 1201 Last data filed at 08/25/2019 0845 Gross per 24 hour  Intake --  Output 100 ml  Net -100 ml     Physical Exam   Slightly sleepy after the OR visit but able to answer questions and follow basic commands, no focal deficits, [place].AT,PERRAL Supple Neck,No JVD, No cervical lymphadenopathy appriciated.  Symmetrical Chest wall movement, Good air movement bilaterally, CTAB RRR,No Gallops, Rubs or new Murmurs, No Parasternal Heave +ve B.Sounds, Abd Soft, No tenderness, No organomegaly appriciated, No rebound - guarding or rigidity. No  Cyanosis, left AKA stump appears stable with wound VAC, does have right heel ulcers as well.     Data Review:    Recent Labs  Lab 08/19/19 0533 08/20/19 0420 08/21/19 0630 08/24/19 0249 08/25/19 0323  WBC 8.0 8.7 8.3 9.1 9.1  HGB 8.8* 9.2* 9.6* 9.2* 8.2*  HCT 27.9* 28.9* 30.1* 29.2* 25.9*  PLT 218 227 224 199 185  MCV 78.6* 78.1* 78.2* 78.9* 78.7*  MCH 24.8* 24.9* 24.9* 24.9* 24.9*  MCHC 31.5 31.8 31.9 31.5 31.7  RDW 19.0* 18.9* 18.9* 18.9* 18.7*  LYMPHSABS  --  1.3 1.0 1.0 1.0  MONOABS  --  1.1* 1.1* 1.2* 1.0  EOSABS  --  0.4 0.4 0.5 0.5  BASOSABS  --  0.1 0.1 0.1 0.1    Recent Labs  Lab 08/18/19 2009 08/18/19 2017 08/19/19 0533 08/20/19 0420 08/21/19 0630 08/24/19 0249 08/25/19 0323  NA  --   --  139 135 136 136 137  K  --   --  3.5 3.7 3.7 3.8 3.5  CL  --   --  97* 94* 95* 97* 100  CO2  --   --  27 24 26 24 24   GLUCOSE  --   --  113* 114* 132* 130* 151*  BUN  --   --  38* 50* 28* 25* 32*  CREATININE  --   --  7.53* 9.46* 6.80* 6.69* 7.92*  CALCIUM  --   --  8.0* 8.3* 8.6* 9.0 8.6*  AST  --   --   --  12* 12* 14*  --   ALT  --   --   --  10 6 7   --   ALKPHOS  --   --   --  48 49 48  --   BILITOT  --   --   --  0.8 0.6 1.0  --   ALBUMIN  --   --   --  2.9* 2.8* 2.8*  --   MG  --   --   --   --   --  1.9 1.8  CRP  --  0.7  --   --   --   --  11.5*  INR  --   --   --   --   --   --  1.3*  HGBA1C 6.2*  --   --   --   --   --   --     Recent Labs  Lab 08/18/19 2017 08/22/19 0425 08/25/19 0323  CRP 0.7  --  11.5*  SARSCOV2NAA  --  NEGATIVE  --     ------------------------------------------------------------------------------------------------------------------ No results for input(s): CHOL, HDL, LDLCALC, TRIG, CHOLHDL, LDLDIRECT in the last 72 hours.  Lab Results  Component Value Date   HGBA1C 6.2 (H) 08/18/2019   ------------------------------------------------------------------------------------------------------------------ No results for  input(s): TSH, T4TOTAL, T3FREE, THYROIDAB in the last 72 hours.  Invalid input(s): FREET3 ------------------------------------------------------------------------------------------------------------------ No results for input(s): VITAMINB12, FOLATE, FERRITIN, TIBC, IRON, RETICCTPCT in the last 72 hours.  Coagulation profile Recent Labs  Lab 08/25/19 0323  INR 1.3*    No results for input(s): DDIMER in the last 72 hours.  Cardiac Enzymes No results for input(s): CKMB, TROPONINI, MYOGLOBIN in the last 168 hours.  Invalid input(s): CK ------------------------------------------------------------------------------------------------------------------    Component Value Date/Time   BNP 1,371.0 (H) 08/13/2017 1147    Micro Results Recent Results (from the past 240 hour(s))  Culture, blood (routine x 2)     Status: None   Collection Time: 08/18/19  8:09 PM   Specimen: BLOOD LEFT HAND  Result Value Ref Range Status   Specimen Description BLOOD LEFT HAND  Final   Special Requests   Final    BOTTLES DRAWN AEROBIC AND ANAEROBIC Blood Culture adequate volume   Culture   Final    NO GROWTH 5 DAYS Performed at [facility], [place] [address], [place], [place] [address]    Report Status 08/23/2019 FINAL  Final  Culture, blood (routine x 2)     Status: None   Collection Time: 08/18/19  8:17 PM   Specimen: BLOOD LEFT HAND  Result Value Ref Range Status   Specimen Description BLOOD LEFT HAND  Final   Special Requests   Final    BOTTLES DRAWN AEROBIC AND ANAEROBIC Blood Culture adequate volume   Culture   Final    NO GROWTH 5 DAYS Performed at [facility], [place] [address] [place], [place] [address]    Report Status 08/23/2019 FINAL  Final  MRSA PCR Screening     Status: None   Collection Time: 08/19/19  8:23 AM   Specimen: Nasal Mucosa; Nasopharyngeal  Result Value Ref Range Status   MRSA by PCR NEGATIVE NEGATIVE Final  Comment:        The GeneXpert MRSA Assay  (FDA approved for NASAL specimens only), is one component of a comprehensive MRSA colonization surveillance program. It is not intended to diagnose MRSA infection nor to guide or monitor treatment for MRSA infections. Performed at [facility], [place] [address], [place], [place] [address]   SARS CORONAVIRUS 2 (TAT 6-24 HRS) Nasopharyngeal Nasopharyngeal Swab     Status: None   Collection Time: 08/22/19  4:25 AM   Specimen: Nasopharyngeal Swab  Result Value Ref Range Status   SARS Coronavirus 2 NEGATIVE NEGATIVE Final    Comment: (NOTE) SARS-CoV-2 target nucleic acids are NOT DETECTED. The SARS-CoV-2 RNA is generally detectable in upper and lower respiratory specimens during the acute phase of infection. Negative results do not preclude SARS-CoV-2 infection, do not rule out co-infections with other pathogens, and should not be used as the sole basis for treatment or other patient management decisions. Negative results must be combined with clinical observations, patient history, and epidemiological information. The expected result is Negative. Fact Sheet for Patients: SugarRoll.be Fact Sheet for Healthcare Providers: https://www.woods-mathews.com/ This test is not yet approved or cleared by the [place] FDA and  has been authorized for detection and/or diagnosis of SARS-CoV-2 by FDA under an Emergency Use Authorization (EUA). This EUA will remain  in effect (meaning this test can be used) for the duration of the COVID-19 declaration under Section 56 4(b)(1) of the Act, 21 U.S.C. section 360bbb-3(b)(1), unless the authorization is terminated or revoked sooner. Performed at [facility], [place] [address] [place], [place] [address]     Radiology Reports DG Chest 1 View  Result Date: 08/18/2019 CLINICAL DATA:  [place]. EXAM: CHEST  1 VIEW COMPARISON:  May 29, 2019 FINDINGS: There is no evidence of acute infiltrate, pleural  effusion or pneumothorax. There is stable marked severity enlargement of the cardiac silhouette. Multiple radiopaque surgical clips are seen along the left axilla. Degenerative changes seen throughout the thoracic spine. IMPRESSION: Stable cardiomegaly without evidence of acute or active cardiopulmonary disease. Electronically Signed   By: Virgina Norfolk M.D.   On: 08/18/2019 21:45   DG Ankle Complete Left  Result Date: 08/19/2019 CLINICAL DATA:  Pain and swelling. Osteomyelitis. EXAM: LEFT ANKLE COMPLETE - 3+ VIEW COMPARISON:  None. FINDINGS: There is no acute displaced fracture or dislocation. There is osteopenia which limits detection of nondisplaced fractures. There may be ulceration overlying the medial malleolus without definite evidence for underlying osteomyelitis. There is a moderate-sized Achilles tendon enthesophyte. There is a small plantar calcaneal spur. Advanced vascular calcifications are noted. IMPRESSION: 1. No acute displaced fracture or dislocation. 2. Possible ulceration overlying the medial malleolus without definite evidence for underlying osteomyelitis. 3. Advanced vascular calcifications. Electronically Signed   By: Constance Holster M.D.   On: 08/19/2019 19:56   DG Ankle Complete Right  Result Date: 08/19/2019 CLINICAL DATA:  Osteomyelitis of the ankle and foot. EXAM: RIGHT ANKLE - COMPLETE 3+ VIEW COMPARISON:  February 01, 2013 FINDINGS: There are advanced degenerative changes of the ankle mortise with surrounding soft tissue swelling. There is no acute displaced fracture. No dislocation. Advanced degenerative changes are noted of the midfoot. There is a moderate-sized plantar calcaneal spur. There is an Achilles tendon enthesophyte. Vascular calcifications are noted. There is no radiographic evidence for osteomyelitis. IMPRESSION: 1. No acute osseous abnormality. 2. Advanced degenerative changes are noted of the midfoot and ankle mortise. Electronically Signed   By: Constance Holster  M.D.   On: 08/19/2019 19:57   DG Abd 1 View  Result Date: 08/18/2019 CLINICAL DATA:  Vomiting, constipation EXAM: ABDOMEN - 1 VIEW COMPARISON:  05/12/2019 FINDINGS: There is a non obstructive bowel gas pattern. No supine evidence of free air. No organomegaly or suspicious calcification. No acute bony abnormality. Aortoiliac atherosclerosis. IMPRESSION: No acute findings. Electronically Signed   By: Rolm Baptise M.D.   On: 08/18/2019 21:40   PERIPHERAL VASCULAR CATHETERIZATION  Result Date: 08/22/2019 Patient name: NIEVE ROJERO MRN: [id number] DOB: [birth year] Sex: female 08/22/2019 Pre-operative Diagnosis: Bilateral lower extremity ulcer Post-operative diagnosis:  Same Surgeon:  [name] Procedure Performed:  1.  Ultrasound-guided access, right femoral artery  2.  Ultrasound-guided access, left femoral artery  3.  Abdominal aortogram  4.  Bilateral lower extremity runoff  5.  Intravascular lithotripsy, right superficial femoral and popliteal artery  6.  Drug-eluting stent, right superficial femoral-popliteal artery  7.  Conscious sedation, 143 minutes Indications: The patient has extensive bilateral lower extremity ulcers.  She is here today for limb salvage. Procedure:  The patient was identified in the holding area and taken to room 8.  The patient was then placed supine on the table and prepped and draped in the usual sterile fashion.  A time out was called.  Conscious sedation was administered with the use of IV fentanyl and Versed under continuous physician and nurse monitoring.  Heart rate, blood pressure, and oxygen saturation were continuously monitored.  Total sedation time was 143 minutes.  I initially tried to get access into the left common femoral artery, however I was unsuccessful therefore I turned my attention towards the right femoral artery.  Ultrasound was used to evaluate the right common femoral artery.  It was patent .  A digital ultrasound image was acquired.  A micropuncture  needle was used to access the right common femoral artery under ultrasound guidance.  An 018 wire was advanced without resistance and a micropuncture sheath was placed.  The 018 wire was removed and a benson wire was placed.  The micropuncture sheath was exchanged for a 5 french sheath.  An omniflush catheter was advanced over the wire to the level of L-1.  An abdominal angiogram was obtained.  Next, the cath was pulled out of the aortic bifurcation and bilateral runoff was performed.  I then followed up for the intervention by gaining access under ultrasound guidance of the left common femoral artery. Findings:  Aortogram: No significant infrarenal aortic stenosis.  Bilateral common and external iliac arteries are patent throughout the course however they are heavily calcified.   Right Lower Extremity:  The right common femoral and profundofemoral artery are heavily calcified but patent throughout their course.  The superficial femoral artery is diffusely diseased with multiple high-grade lesions down to the adductor canal where it occludes.  There is reconstitution of the popliteal artery just below the joint space.  There is two-vessel runoff via the peroneal and posterior tibial artery.  Left Lower Extremity: The common femoral and profundofemoral artery are patent without stenosis but heavily calcified.  The superficial femoral artery is occluded with reconstitution of the superficial femoral artery at the adductor canal with diffusely diseased three-vessel runoff and minimal opacification of the digital arteries. Intervention: After the above images were acquired the decision made to proceed with intervention.  The sheath in the right groin was removed.  I tried to use a minx however this failed because the balloon popped.  Manual pressure was held for  10 minutes.  I then placed a 7 x 45 sheath from the left groin into the right external iliac artery and fully heparinized the patient.  Using a quick cross  catheter and 035 Glidewire followed by a Glidewire advantage I was able to get across the stenosis.  The lesion was predilated with a 4 x 100 balloon.  I then performed shockwave intravascular ultrasound of the popliteal artery just distal to the joint space up to its origin.  A 5 x 60 balloon was used for the distal half and a 6 x 60 balloon was used for the proximal half.  Total treatment time was 510 seconds.  I then elected to stent the treated area.  A 5 x 100 [place] stent was placed distally followed by [place] 6 x 120, 7 x 120, and 7 x 40.  The stents were then molded with a 5 mm balloon and completion imaging showed inline flow down across the foot.  Catheters and wires were removed.  A short 7 French sheath was placed.  Patient tolerated the procedure well there were no complications. 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  Successfully recanalized and treated with shockwave intravascular lithotripsy followed by drug-eluting stent placement from just below the joint space in the popliteal artery up to the origin of the superficial femoral artery.  There is two-vessel runoff via the peroneal and posterior tibial artery.  #2  Left superficial femoral artery occlusion  V. [name], M.D., [facility] [place] Office: [phone] Pager:  [phone]  DG Foot 2 Views Left  Result Date: 08/19/2019 CLINICAL DATA:  Osteomyelitis of the ankle or foot. EXAM: LEFT FOOT - 2 VIEW COMPARISON:  08/18/2019 FINDINGS: Again noted is a foreign body in the medial soft tissues of the left foot. There are degenerative changes of the interphalangeal joints. Advanced vascular calcifications are noted. There is a moderate-sized plantar calcaneal spur. There is no acute displaced fracture or dislocation. There is no definite radiographic evidence for osteomyelitis. IMPRESSION: 1. No definite radiographic evidence for osteomyelitis. 2. Stable  foreign body in the medial soft tissues of the left foot. 3. Degenerative changes of the interphalangeal joints. Electronically Signed   By: Constance Holster M.D.   On: 08/19/2019 19:55   DG Foot Complete Right  Result Date: 08/19/2019 CLINICAL DATA:  Osteomyelitis. 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 There is a lucency at the head of the fifth metatarsal. There is some surrounding soft tissue swelling. Vascular calcifications are noted. There are degenerative changes of the midfoot. IMPRESSION: 1. No acute displaced fracture or dislocation. 2. 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 There is a persistent ill-defined lucency at the head of the fifth metatarsal which can be seen in patients with osteomyelitis. Electronically Signed   By: Constance Holster M.D.   On: 08/19/2019 19:59   VAS [place] ABI WITH/WO TBI  Result Date: 08/21/2019 LOWER EXTREMITY DOPPLER STUDY Indications: Gangrene, and peripheral artery disease. High Risk Factors: Diabetes.  Performing Technologist: June Leap Rvt, Rdms  Examination Guidelines: A complete evaluation includes at minimum, Doppler waveform signals and systolic blood pressure reading at the level of bilateral brachial, anterior tibial, and posterior tibial arteries, when vessel segments are accessible. Bilateral testing is considered an integral part of a complete examination. Photoelectric Plethysmograph (PPG)  waveforms and toe systolic pressure readings are included as required and additional duplex testing as needed. Limited examinations for reoccurring indications may be performed as noted.  ABI Findings: +---------+-----------------+-----+------------------+-------------------------+ Right    Rt Pressure      IndexWaveform          Comment                            (mmHg)                                                             +---------+-----------------+-----+------------------+-------------------------+ Brachial                                         BP not taken-restricted                                                    arm                       +---------+-----------------+-----+------------------+-------------------------+ ATA      206              1.02 dampened                                                                   monophasic                                  +---------+-----------------+-----+------------------+-------------------------+ PTA      147              0.73 dampened                                                                   monophasic                                  +---------+-----------------+-----+------------------+-------------------------+ Great Toe                      Abnormal                                    +---------+-----------------+-----+------------------+-------------------------+ +---------+------------------+-----+-------------------+-------+ Left     Lt Pressure (mmHg)IndexWaveform           Comment +---------+------------------+-----+-------------------+-------+ Brachial 201  triphasic                  +---------+------------------+-----+-------------------+-------+ ATA      97                0.48 dampened monophasic        +---------+------------------+-----+-------------------+-------+ PTA      19                0.09 dampened monophasic        +---------+------------------+-----+-------------------+-------+ Great Toe                       Abnormal                   +---------+------------------+-----+-------------------+-------+ Great toe PPG waveform too dampened to obtain pressures bilaterally.  Summary: Right: ABI is within normal range by ATA pressure, however abnormal pedal artery waveforms suggest this is falsely elevated. ABI is in the moderate range based on PTA  pressure. Severely dampened PPG waveform of Great toe. Left: Resting left ankle-brachial index indicates severe left lower extremity arterial disease. Severely dampened PPG waveform of Great toe.  *See table(s) above for measurements and observations.  Electronically signed by Servando Snare MD on 08/21/2019 at 5:13:35 PM.    Final     Time Spent in minutes  30   Lala Lund M.D on 08/25/2019 at 12:01 PM  To page go to [email or web address] - password [facility]

## 2019-08-25 NOTE — Progress Notes (Signed)
ANTICOAGULATION CONSULT NOTE - Initial Consult  Pharmacy Consult for heparin Indication: atrial fibrillation  Allergies  Allergen Reactions  . Olmesartan Other (See Comments)    Hyperkalemia     Patient Measurements: Weight: 100.7 kg (222 lb 0.1 oz) Heparin Dosing Weight: 82.1  Vital Signs: Temp: 97.7 F (36.5 C) (04/11 0943) Temp Source: Oral (04/11 0943) BP: 121/52 (04/11 0943) Pulse Rate: 71 (04/11 0943)  Labs: Recent Labs    08/24/19 0249 08/25/19 0323  HGB 9.2* 8.2*  HCT 29.2* 25.9*  PLT 199 185  LABPROT  --  16.2*  INR  --  1.3*  CREATININE 6.69* 7.92*    Estimated Creatinine Clearance: 7.6 mL/min (A) (by C-G formula based on SCr of 7.92 mg/dL (H)).   Medical History: Past Medical History:  Diagnosis Date  . Arthritis   . Cancer [facility]) 2005    left breast  . CHF (congestive heart failure) ([place])   . Chronic back pain   . Chronic kidney disease 03/2014   dialysis t/th/sa  . Coronary artery disease   . Diabetes mellitus    Type 2  . Diabetic nephropathy ([place]) 01-08-13  . Dyslipidemia 01-08-13  . ESRD on hemodialysis ([place])    Tu, Th, Sat  . Headache   . Hyperlipidemia   . Hypertension   . LBBB (left bundle branch block)   . Proteinuria 01-08-13  . Pulmonary hypertension ([place])    PA peak pressure 73 mmHg 08/05/17 echo  . Thyroid disease 01-08-13   Hyper-parathyroidism-secondary  . Wears glasses     Medications:  Scheduled:  . amiodarone  200 mg Oral Daily  . aspirin EC  81 mg Oral Daily  . atorvastatin  80 mg Oral Daily  . clopidogrel  75 mg Oral Q breakfast  . collagenase   Topical Daily  . darbepoetin (ARANESP) injection - DIALYSIS  60 mcg Intravenous Q Tue-HD  . docusate sodium  100 mg Oral QODAY  . docusate sodium  100 mg Oral BID  . doxercalciferol  3 mcg Intravenous Q T,Th,Sa-HD  . ferric citrate  420 mg Oral TID WC  . heparin injection (subcutaneous)  5,000 Units Subcutaneous Q8H  . insulin aspart  0-5 Units Subcutaneous QHS  .  insulin aspart  0-9 Units Subcutaneous TID WC  . midodrine  5 mg Oral TID WC  . multivitamin  1 tablet Oral QHS  . QUEtiapine  25 mg Oral QHS   Infusions:  . sodium chloride 10 mL/hr at 08/22/19 0906  . sodium chloride 10 mL/hr at 08/25/19 0702  . sodium chloride    . ceFEPime (MAXIPIME) IV 1 g (08/24/19 2038)  . methocarbamol (ROBAXIN) IV    . [START ON 08/27/2019] vancomycin    . vancomycin      Assessment: Pt is a 74 y/o female with PMH of A.fib on Eliquis PTA, ESRD on HD TTS, CAD, pulmonary hypertension, LBBB, hypertension, hyperlipidemia, CHF, type 2 diabetes, diabetic neuropathy, CVAwho presented to the ED with bilateral heel ulcers. 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 Pharmacy has been consulted to dose heparin.  Pt was on apixaban PTA but last dose is unknown. Since pt is an HD patient, unclear if apixaban has completely washed out or not. Hg/Hct are stable at 8.2/25.9. Plts are 185. Of note, patient is also on dual antiplatelet therapy   Goal of Therapy:  Heparin level 0.3-0.7 units/ml Monitor platelets by anticoagulation protocol: Yes   Plan:  Start heparin infusion at 950 units/hr - no bolus  given recent surgery Check anti-Xa level and aPTT in 8 hours and daily while on heparin Continue to monitor H&H and platelets daily Watch for signs/symptoms of bleeding  Sherren Kerns, PharmD PGY1 Acute Care Pharmacy Resident  08/25/2019,12:09 PM

## 2019-08-25 NOTE — Op Note (Signed)
08/25/2019  8:51 AM  PATIENT:  Alexis [name]    PRE-OPERATIVE DIAGNOSIS:  OSTEOMYLITIS with gangrene left foot  POST-OPERATIVE DIAGNOSIS:  Same  PROCEDURE:  LEFT AMPUTATION ABOVE KNEE  SURGEON:  Newt Minion, MD  PHYSICIAN ASSISTANT:None ANESTHESIA:   General  PREOPERATIVE INDICATIONS:  Alexis [name] is a  74 y.o. female with a diagnosis of OSTEOMYLITIS who failed conservative measures and elected for surgical management.    The risks benefits and alternatives were discussed with the patient preoperatively including but not limited to the risks of infection, bleeding, nerve injury, cardiopulmonary complications, the need for revision surgery, among others, and the patient was willing to proceed.  OPERATIVE IMPLANTS: Praveena customizable and Arthur form wound VAC sponges  @ENCIMAGES @  OPERATIVE FINDINGS: Calcified vessels at the amputation site with calcification through the graft  OPERATIVE PROCEDURE: Patient was brought the operating room underwent a general anesthetic. After adequate levels anesthesia were obtained patient's left lower extremity was prepped using DuraPrep draped into a sterile field the gangrenous foot was wrapped out of the sterile field with impervious stockinette. A fishmouth incision was made just proximal to the patella. This was carried down to the femur a reciprocating saw was used to resect the femur. The femoral vessels were clamped and suture ligated with 2-0 silk x2. Electrocautery was used for further hemostasis. The amputation was completed. The deep and superficial fascia layers and skin were closed using 2-0 nylon. Staples were used to further complete the closure. A customizable and Arthur form wound VAC dressing was applied this had a good suction fit patient was extubated taken the PACU in stable condition   DISCHARGE PLANNING:  Antibiotic duration: Continue antibiotics for 24 hours  Weightbearing: Nonweightbearing on the left  Pain  medication: Opioid pathway ordered  Dressing care/ Wound VAC: Continue wound VAC for 1 week after discharge  Ambulatory devices: Walker  Discharge to: After discussion with the husband patient will need discharge to skilled nursing  Follow-up: In the office 1 week post operative.

## 2019-08-25 NOTE — Anesthesia Preprocedure Evaluation (Signed)
Anesthesia Evaluation  Patient identified by MRN, date of birth, ID band Patient awake    Reviewed: Allergy & Precautions, H&P , NPO status , Patient's Chart, lab work & pertinent test results  Airway Mallampati: II   Neck ROM: full    Dental   Pulmonary shortness of breath, former smoker,    breath sounds clear to auscultation       Cardiovascular hypertension, + angina + CAD, + Peripheral Vascular Disease and +CHF  + dysrhythmias Atrial Fibrillation  Rhythm:regular Rate:Normal     Neuro/Psych  Headaches, CVA    GI/Hepatic GERD  ,  Endo/Other  diabetes, Type 2Morbid obesity  Renal/GU ESRF and DialysisRenal disease     Musculoskeletal  (+) Arthritis ,   Abdominal   Peds  Hematology  (+) Blood dyscrasia, anemia ,   Anesthesia Other Findings   Reproductive/Obstetrics                             Anesthesia Physical Anesthesia Plan  ASA: IV  Anesthesia Plan: General   Post-op Pain Management:    Induction: Intravenous  PONV Risk Score and Plan: 3 and Ondansetron and Treatment may vary due to age or medical condition  Airway Management Planned: LMA  Additional Equipment:   Intra-op Plan:   Post-operative Plan: Extubation in OR  Informed Consent: I have reviewed the patients History and Physical, chart, labs and discussed the procedure including the risks, benefits and alternatives for the proposed anesthesia with the patient or authorized representative who has indicated his/her understanding and acceptance.       Plan Discussed with: CRNA, Anesthesiologist and Surgeon  Anesthesia Plan Comments:         Anesthesia Quick Evaluation

## 2019-08-25 NOTE — Anesthesia Procedure Notes (Signed)
Procedure Name: LMA Insertion Performed by: Oletta Lamas, CRNA Pre-anesthesia Checklist: Patient identified, Emergency Drugs available, Suction available and Patient being monitored Patient Re-evaluated:Patient Re-evaluated prior to induction Oxygen Delivery Method: Circle System Utilized Preoxygenation: Pre-oxygenation with 100% oxygen Induction Type: IV induction Ventilation: Mask ventilation without difficulty LMA: LMA inserted LMA Size: 4.0 Number of attempts: 1 Placement Confirmation: positive ETCO2 Tube secured with: Tape Dental Injury: Teeth and Oropharynx as per pre-operative assessment

## 2019-08-25 NOTE — Transfer of Care (Signed)
Immediate Anesthesia Transfer of Care Note  Patient: Alexis [name]  Procedure(s) Performed: LEFT AMPUTATION ABOVE KNEE (Left Knee)  Patient Location: PACU  Anesthesia Type:General  Level of Consciousness: drowsy and patient cooperative  Airway & Oxygen Therapy: Patient Spontanous Breathing and Patient connected to face mask oxygen  Post-op Assessment: Report given to RN and Post -op Vital signs reviewed and stable  Post vital signs: Reviewed and stable  Last Vitals:  Vitals Value Taken Time  BP    Temp    Pulse    Resp    SpO2      Last Pain:  Vitals:   08/25/19 0845  TempSrc:   PainSc: (P) Asleep         Complications: No apparent anesthesia complications

## 2019-08-25 NOTE — Progress Notes (Signed)
Pt oral meds held due to pt being lethargic. Pt responds appropriately. Pt also refusing to eat/drink.  Jerald Kief, RN

## 2019-08-25 NOTE — Progress Notes (Signed)
Pt arrived from PACU. Tele applied/ccmd notified Vitals stable. Wound vac in place with good seal and 0 drainage. Pt denies complaints. Will notify family. Call bell within reach will continue to monitor.  Jerald Kief, RN

## 2019-08-25 NOTE — Progress Notes (Signed)
Subjective: no new c/o today.  For surgery tomorrow per ortho.   Objective Vital signs in last 24 hours: Vitals:   08/25/19 0915 08/25/19 0930 08/25/19 0943 08/25/19 1200  BP: 107/64 118/72 (!) 121/52   Pulse: 74 72 71   Resp: 14 17 16    Temp: 98.1 F (36.7 C) 98.2 F (36.8 C) 97.7 F (36.5 C)   TempSrc:   Oral   SpO2: 96% 96% 94%   Weight:    100.7 kg  Height:    5\' 6"  (1.676 m)   Weight change:   Physical Exam: General: Alert, nad  Heart: RRR  Lungs: clear bilaterally  Abdomen: soft non-tender Extremities: no sig LE edema, feet in booties Dialysis Access: RUE AVG +bruit    OP Dialysis Orders: [facility] TTS 4.25 hr 400/600 96 kg 2.0 K/2.5 Ca R AVG -Heparin 2000 units IV initial bolus then 1000 units/hr. Stop 1 hour before end of treatment -Epogen 5400 units IV TIW -Hectorol 2.5 mcg IV TIW -Venofer 50 mg IV weekly  Problem/Plan: 1.  PAD/Bilateral foot ulcers/osteomyelitis. BCx NGTD. On vanc./ Maxipime  S/p aortogram w lithotripsy/stent RLE per [facility] on 4/9 2. ESRD - HD TTS.  HD today or possibly will get postponed to Sunday d/t heavy pt census 3. Hypertension/volume -BP ok. On Midodrine for BP support. No evidence of volume excess. Is under OP EDW UF as tolerated.  4. Anemia -HGB 9.6  Epogen not on formulary.  Aranesp 60 mcg IV with HD 4/6.  5. Metabolic bone disease - Ca ok.  Calcitriol and hectorol both on OP med list. .  DC oral calcitriol. Continue Auryxia 210 mg PO TID AC 1 tab with snack. 6. Nutrition - Renal/Carb mod diet.  7. DM- per primary.    Kelly Splinter, MD 08/24/2019,2:10 PM  LOS: 7 days   Cardiac Enzymes: No results for input(s): CKTOTAL, CKMB, CKMBINDEX, TROPONINI in the last 168 hours. CBG: Recent Labs  Lab 08/24/19 1559 08/25/19 0634 08/25/19 0846 08/25/19 0957 08/25/19 1221  GLUCAP 169* 140* 128* 154* 176*     Medications: . sodium chloride 10 mL/hr at 08/22/19 0906  . sodium chloride 10 mL/hr at 08/25/19 0702  .  sodium chloride    . ceFEPime (MAXIPIME) IV 1 g (08/24/19 2038)  . heparin    . methocarbamol (ROBAXIN) IV    . [START ON 08/27/2019] vancomycin    . vancomycin     . amiodarone  200 mg Oral Daily  . aspirin EC  81 mg Oral Daily  . atorvastatin  80 mg Oral Daily  . clopidogrel  75 mg Oral Q breakfast  . collagenase   Topical Daily  . darbepoetin (ARANESP) injection - DIALYSIS  60 mcg Intravenous Q Tue-HD  . docusate sodium  100 mg Oral QODAY  . docusate sodium  100 mg Oral BID  . doxercalciferol  3 mcg Intravenous Q T,Th,Sa-HD  . ferric citrate  420 mg Oral TID WC  . insulin aspart  0-5 Units Subcutaneous QHS  . insulin aspart  0-9 Units Subcutaneous TID WC  . midodrine  5 mg Oral TID WC  . multivitamin  1 tablet Oral QHS  . QUEtiapine  25 mg Oral QHS

## 2019-08-26 ENCOUNTER — Inpatient Hospital Stay (HOSPITAL_COMMUNITY): Payer: Medicare Other

## 2019-08-26 ENCOUNTER — Other Ambulatory Visit (HOSPITAL_COMMUNITY): Payer: Medicare Other

## 2019-08-26 DIAGNOSIS — N186 End stage renal disease: Secondary | ICD-10-CM | POA: Diagnosis not present

## 2019-08-26 DIAGNOSIS — M869 Osteomyelitis, unspecified: Secondary | ICD-10-CM | POA: Diagnosis not present

## 2019-08-26 DIAGNOSIS — E11621 Type 2 diabetes mellitus with foot ulcer: Secondary | ICD-10-CM | POA: Diagnosis not present

## 2019-08-26 DIAGNOSIS — M86172 Other acute osteomyelitis, left ankle and foot: Secondary | ICD-10-CM | POA: Diagnosis not present

## 2019-08-26 LAB — BASIC METABOLIC PANEL
Anion gap: 15 (ref 5–15)
BUN: 24 mg/dL — ABNORMAL HIGH (ref 8–23)
CO2: 25 mmol/L (ref 22–32)
Calcium: 8.7 mg/dL — ABNORMAL LOW (ref 8.9–10.3)
Chloride: 99 mmol/L (ref 98–111)
Creatinine, Ser: 6.38 mg/dL — ABNORMAL HIGH (ref 0.44–1.00)
GFR calc Af Amer: 7 mL/min — ABNORMAL LOW (ref >60)
GFR calc non Af Amer: 6 mL/min — ABNORMAL LOW (ref >60)
Glucose, Bld: 133 mg/dL — ABNORMAL HIGH (ref 70–99)
Potassium: 4.3 mmol/L (ref 3.5–5.1)
Sodium: 139 mmol/L (ref 135–145)

## 2019-08-26 LAB — CBC WITH DIFFERENTIAL/PLATELET
Abs Immature Granulocytes: 0.06 10*3/uL (ref 0.00–0.07)
Basophils Absolute: 0 10*3/uL (ref 0.0–0.1)
Basophils Relative: 0 %
Eosinophils Absolute: 0 10*3/uL (ref 0.0–0.5)
Eosinophils Relative: 0 %
HCT: 25.6 % — ABNORMAL LOW (ref 36.0–46.0)
Hemoglobin: 8.2 g/dL — ABNORMAL LOW (ref 12.0–15.0)
Immature Granulocytes: 1 %
Lymphocytes Relative: 7 %
Lymphs Abs: 0.7 10*3/uL (ref 0.7–4.0)
MCH: 25.2 pg — ABNORMAL LOW (ref 26.0–34.0)
MCHC: 32 g/dL (ref 30.0–36.0)
MCV: 78.5 fL — ABNORMAL LOW (ref 80.0–100.0)
Monocytes Absolute: 1.2 10*3/uL — ABNORMAL HIGH (ref 0.1–1.0)
Monocytes Relative: 11 %
Neutro Abs: 8.9 10*3/uL — ABNORMAL HIGH (ref 1.7–7.7)
Neutrophils Relative %: 81 %
Platelets: 205 10*3/uL (ref 150–400)
RBC: 3.26 MIL/uL — ABNORMAL LOW (ref 3.87–5.11)
RDW: 18.8 % — ABNORMAL HIGH (ref 11.5–15.5)
WBC: 11 10*3/uL — ABNORMAL HIGH (ref 4.0–10.5)
nRBC: 0 % (ref 0.0–0.2)

## 2019-08-26 LAB — GLUCOSE, CAPILLARY
Glucose-Capillary: 152 mg/dL — ABNORMAL HIGH (ref 70–99)
Glucose-Capillary: 158 mg/dL — ABNORMAL HIGH (ref 70–99)
Glucose-Capillary: 150 mg/dL — ABNORMAL HIGH (ref 70–99)
Glucose-Capillary: 222 mg/dL — ABNORMAL HIGH (ref 70–99)

## 2019-08-26 LAB — C-REACTIVE PROTEIN: CRP: 11.5 mg/dL — ABNORMAL HIGH (ref <1.0)

## 2019-08-26 LAB — HEPARIN LEVEL (UNFRACTIONATED)
Heparin Unfractionated: 0.25 IU/mL — ABNORMAL LOW (ref 0.30–0.70)
Heparin Unfractionated: 0.34 IU/mL (ref 0.30–0.70)

## 2019-08-26 LAB — VANCOMYCIN, RANDOM: Vancomycin Rm: 17

## 2019-08-26 LAB — APTT: aPTT: 90 seconds — ABNORMAL HIGH (ref 24–36)

## 2019-08-26 MED ORDER — OXYCODONE HCL 5 MG PO TABS
5.0000 mg | ORAL_TABLET | ORAL | Status: DC | PRN
  Administered 2019-08-26: 5 mg via ORAL
  Filled 2019-08-26: qty 1

## 2019-08-26 MED ORDER — NEPRO/CARBSTEADY PO LIQD
237.0000 mL | Freq: Three times a day (TID) | ORAL | Status: DC
  Administered 2019-08-28 – 2019-08-30 (×3): 237 mL via ORAL

## 2019-08-26 MED ORDER — PRO-STAT SUGAR FREE PO LIQD
30.0000 mL | Freq: Two times a day (BID) | ORAL | Status: DC
  Administered 2019-08-28 – 2019-08-31 (×3): 30 mL via ORAL
  Filled 2019-08-26 (×8): qty 30

## 2019-08-26 MED ORDER — VANCOMYCIN HCL IN DEXTROSE 1-5 GM/200ML-% IV SOLN
1000.0000 mg | INTRAVENOUS | Status: DC
  Filled 2019-08-26: qty 200

## 2019-08-26 NOTE — Progress Notes (Signed)
[place] for Heparin (apixaban on hold) Indication: atrial fibrillation  Allergies  Allergen Reactions  . Olmesartan Other (See Comments)    Hyperkalemia     Patient Measurements: Height: 5\' 6"  (167.6 cm) Weight: 93.6 kg (206 lb 5.6 oz) IBW/kg (Calculated) : 59.3 Heparin Dosing Weight: 82.1  Vital Signs: Temp: 98 F (36.7 C) (04/11 1948) Temp Source: Axillary (04/11 1948) BP: 120/56 (04/11 1948) Pulse Rate: 76 (04/11 1948)  Labs: Recent Labs    08/24/19 0249 08/24/19 0249 08/25/19 0323 08/25/19 1405 08/26/19 0103  HGB 9.2*   < > 8.2* 8.5*  --   HCT 29.2*  --  25.9* 26.8*  --   PLT 199  --  185 201  --   APTT  --   --   --   --  90*  LABPROT  --   --  16.2*  --   --   INR  --   --  1.3*  --   --   HEPARINUNFRC  --   --   --   --  0.25*  CREATININE 6.69*  --  7.92* 8.71*  --    < > = values in this interval not displayed.    Estimated Creatinine Clearance: 6.6 mL/min (A) (by C-G formula based on SCr of 8.71 mg/dL (H)).   Medical History: Past Medical History:  Diagnosis Date  . Arthritis   . Cancer [facility]) 2005    left breast  . CHF (congestive heart failure) ([place])   . Chronic back pain   . Chronic kidney disease 03/2014   dialysis t/th/sa  . Coronary artery disease   . Diabetes mellitus    Type 2  . Diabetic nephropathy ([place]) 01-08-13  . Dyslipidemia 01-08-13  . ESRD on hemodialysis ([place])    Tu, Th, Sat  . Headache   . Hyperlipidemia   . Hypertension   . LBBB (left bundle branch block)   . Proteinuria 01-08-13  . Pulmonary hypertension ([place])    PA peak pressure 73 mmHg 08/05/17 echo  . Thyroid disease 01-08-13   Hyper-parathyroidism-secondary  . Wears glasses     Medications:  Scheduled:  . amiodarone  200 mg Oral Daily  . aspirin EC  81 mg Oral Daily  . atorvastatin  80 mg Oral Daily  . clopidogrel  75 mg Oral Q breakfast  . collagenase   Topical Daily  . darbepoetin (ARANESP) injection - DIALYSIS  60  mcg Intravenous Q Tue-HD  . docusate sodium  100 mg Oral QODAY  . docusate sodium  100 mg Oral BID  . doxercalciferol  3 mcg Intravenous Q T,Th,Sa-HD  . feeding supplement (NEPRO CARB STEADY)  237 mL Oral BID BM  . ferric citrate  420 mg Oral TID WC  . insulin aspart  0-5 Units Subcutaneous QHS  . insulin aspart  0-9 Units Subcutaneous TID WC  . midodrine  5 mg Oral TID WC  . multivitamin  1 tablet Oral QHS  . QUEtiapine  25 mg Oral QHS   Infusions:  . sodium chloride 10 mL/hr at 08/22/19 0906  . sodium chloride 10 mL/hr at 08/25/19 0702  . sodium chloride    . ceFEPime (MAXIPIME) IV 1 g (08/25/19 2054)  . heparin 950 Units/hr (08/25/19 1645)  . methocarbamol (ROBAXIN) IV    . [START ON 08/27/2019] vancomycin    . vancomycin      Assessment: Pt is a 74 y/o female with PMH of  A.fib on Eliquis PTA, ESRD on HD TTS, CAD, pulmonary hypertension, LBBB, hypertension, hyperlipidemia, CHF, type 2 diabetes, diabetic neuropathy, CVAwho presented to the ED with bilateral heel ulcers. 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 Pharmacy has been consulted to dose heparin.  Pt was on apixaban PTA but last dose is unknown. Since pt is an HD patient, unclear if apixaban has completely washed out or not. Hg/Hct are stable at 8.2/25.9. Plts are 185. Of note, patient is also on dual antiplatelet therapy   4/12 AM update:  Heparin level sub-therapeutic  Goal of Therapy:  Heparin level 0.3-0.7 units/ml Monitor platelets by anticoagulation protocol: Yes   Plan:  Inc heparin to 1050 units/hr 1030 heparin level  Narda Bonds, PharmD, BCPS Clinical Pharmacist Phone: [phone]

## 2019-08-26 NOTE — Progress Notes (Signed)
Pharmacy Antibiotic Note  Alexis [name] is a 74 y.o. female admitted on 08/18/2019 with DFI and osteomyelitis. Pharmacy has been consulted for vancomycin and cefepime dosing. Pt now s/p L AKA, antibiotics continued with concern for R foot osteomyelitis, MRI ordered to evaluate. Pt has ESRD on HD TTS which was off schedule this weekend (last iHD 4/11). Vancomycin random 4/11 am was therapeutic at 18 mcg/ml but unclear whether vancomycin dose given during HD. Repeat vancomycin level this morning is therapeutic at 17 mcg/ml.   Plan: -Continue cefepime 1g IV QHS -Vancomycin 1000mg  IV qHD Tues/Thurs/Sat -F/U foot MRI to assess if ABX need to be continued    Height: 5\' 6"  (167.6 cm) Weight: 93.6 kg (206 lb 5.6 oz) IBW/kg (Calculated) : 59.3  Temp (24hrs), Avg:98.3 F (36.8 C), Min:97.8 F (36.6 C), Max:99.5 F (37.5 C)  Recent Labs  Lab 08/21/19 0630 08/24/19 0249 08/25/19 0323 08/25/19 1405 08/26/19 0245  WBC 8.3 9.1 9.1 10.5 11.0*  CREATININE 6.80* 6.69* 7.92* 8.71* 6.38*  VANCORANDOM  --   --  18  --   --     Estimated Creatinine Clearance: 9.1 mL/min (A) (by C-G formula based on SCr of 6.38 mg/dL (H)).    Allergies  Allergen Reactions  . Olmesartan Other (See Comments)    Hyperkalemia    Antimicrobials: Vancomycin 4/5>> Cefepime 4/5>>  Microbiology: 4/4: BCx: negative 4/5: MRSA PCR: negative   Arrie Senate, PharmD, BCPS Clinical Pharmacist [phone] Please check AMION for all [facility] Pharmacy numbers 08/26/2019

## 2019-08-26 NOTE — Progress Notes (Signed)
Initial Nutrition Assessment  DOCUMENTATION CODES:   Obesity unspecified  INTERVENTION:  Provide Nepro Shake po TID, each supplement provides 425 kcal and 19 grams protein.  Provide 30 ml Prostat po BID, each supplement provides 100 kcal and 15 grams of protein.   Encourage adequate PO intake.   NUTRITION DIAGNOSIS:   Increased nutrient needs related to wound healing as evidenced by estimated needs  GOAL:   Patient will meet greater than or equal to 90% of their needs  MONITOR:   PO intake, Supplement acceptance, Skin, Weight trends, Labs, I & O's  REASON FOR ASSESSMENT:   Low Braden    ASSESSMENT:   74 y.o. female with medical history significant of A. fib on Eliquis, ESRD on HD TTS, CAD, pulmonary hypertension, LBBB, hypertension, hyperlipidemia, CHF, type 2 diabetes, diabetic neuropathy, CVA presented to [facility] ED with complaints of bilateral heel ulcers.  Diagnosed with osteomyelitis of the right fifth metatarsal head and transferred to [facility] for further management as she is a dialysis patient. Pt with dementia with hospital-acquired delirium due to unfamiliar setting along with toxic encephalopathy due to infection.  4/8 - revascularization 4/11 - Left AKA  RD working remotely.   Meal completion has been 10-25%. Per MD, question of R fifth toe residual infection. Plan for MRI to rule out chronic infection. RD to order nutritional supplements to aid in caloric and protein needs as well as in wound healing.   Unable to complete Nutrition-Focused physical exam at this time.   Labs and medications reviewed.   Diet Order:   Diet Order            Diet renal with fluid restriction Fluid restriction: 1200 mL Fluid; Room service appropriate? No; Fluid consistency: Thin  Diet effective now              EDUCATION NEEDS:   Not appropriate for education at this time  Skin:  Skin Assessment: Skin Integrity Issues: Skin Integrity Issues:: Stage II,  Incisions, Other (Comment), Wound VAC Stage II: L buttocks Wound Vac: L thigh Incisions: L leg Other: wound R foot  Last BM:  4/11  Height:   Ht Readings from Last 1 Encounters:  08/25/19 5\' 6"  (1.676 m)    Weight:   Wt Readings from Last 1 Encounters:  08/25/19 93.6 kg    BMI:  Body mass index is 33.31 kg/m.  Estimated Nutritional Needs:   Kcal:  1900-2100  Protein:  105-115 grams  Fluid:  1.2 L/day    Corrin Parker, MS, RD, LDN RD pager number/after hours weekend pager number on Amion.

## 2019-08-26 NOTE — Evaluation (Signed)
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 PMH includes: afib on eliquis, ESRD on HD TTS, CAD pulmonary HTN, HTN, HLD, CHF DM II with neuropathy, and CVA.  Clinical Impression  Pt in bed upon arrival of PT, agreeable to evaluation at this time. Prior to admission the pt was mobilizing with use of WC and required assistance for transfers/ADLs. The pt now presents with limitations in functional mobility, balance, and endurance due to above dx, and will continue to benefit from skilled PT to address these deficits. The pt's course is further complicated by the onset of hospital-acquired delirium on baseline dementia. The pt was unable to fully explain her prior level of function and home set up, but was able to follow simple commands with increased time and multimodal cues. The pt was able to tolerate sitting at the EOB for ~8 min, but required maxA to transition to sitting EOB, and assist to maintain balance sitting EOB. The pt will continue to benefit from skilled PT acutely to further progress functional mobility as well as prior to return home due to significant assistance needed for mobility.       Follow Up Recommendations SNF;Supervision/Assistance - 24 hour    Equipment Recommendations  (defer to post acute)    Recommendations for Other Services       Precautions / Restrictions Precautions Precautions: Fall Precaution Comments: L AKA with wound vac Restrictions Weight Bearing Restrictions: Yes LLE Weight Bearing: Non weight bearing      Mobility  Bed Mobility Overal bed mobility: Needs Assistance Bed Mobility: Rolling;Sidelying to Sit;Sit to Supine Rolling: Max assist Sidelying to sit: Max assist;+2 for physical assistance   Sit to supine: Max assist    General bed mobility comments: pt able to initiate RLE movement, but needs multimodal cueing and assist with bed pads to complete mobility and scooting to arrange sitting EOB. sitting balance ranged from minG to maxA of 1  Transfers Overall transfer level: (pt unable at this time)                  Ambulation/Gait                Stairs            Wheelchair Mobility    Modified Rankin (Stroke Patients Only)       Balance Overall balance assessment: Needs assistance Sitting-balance support: Bilateral upper extremity supported;Feet supported Sitting balance-Leahy Scale: Poor Sitting balance - Comments: reliant on BUE support and external support ranging from minG to maxA with pt fatigue/core activation Postural control: Posterior lean                                   Pertinent Vitals/Pain Pain Assessment: No/denies pain    Home Living Family/patient expects to be discharged to:: Private residence(pt unreliable historian, unable to describe details of her home consistently. Will need family to confirm living situation)               Home Equipment: Gilford Rile - 4 wheels;Walker - 2 wheels;Wheelchair - manual;Shower seat      Prior Function Level of Independence: Needs assistance   Gait / Transfers Assistance Needed: pt reports she was able to  stand to use WC for mobility, denies use of lift           Hand Dominance   Dominant Hand: Right    Extremity/Trunk Assessment   Upper Extremity Assessment Upper Extremity Assessment: Generalized weakness    Lower Extremity Assessment Lower Extremity Assessment: Generalized weakness;LLE deficits/detail LLE Deficits / Details: new L AKA with wound vac       Communication      Cognition Arousal/Alertness: Awake/alert Behavior During Therapy: WFL for tasks assessed/performed Overall Cognitive Status: Impaired/Different from baseline Area of Impairment:  Orientation;Attention;Memory;Following commands;Safety/judgement;Awareness;Problem solving                 Orientation Level: Disoriented to;Place;Time Current Attention Level: Focused Memory: Decreased short-term memory Following Commands: Follows one step commands with increased time Safety/Judgement: Decreased awareness of safety Awareness: Emergent Problem Solving: Slow processing;Decreased initiation;Difficulty sequencing;Requires verbal cues General Comments: Pt with poor orientation, deferring questions when she knows she is unable to answer correctly (for example answering "I'm supposed to" when asked if she knows the day of the week). Pt with slowed processing, but is generally able to follow commands with increased time and multimodal cues, difficulty with answering questions about wants/PLF.      General Comments      Exercises     Assessment/Plan    PT Assessment Patient needs continued PT services  PT Problem List Decreased mobility;Decreased strength;Decreased safety awareness;Decreased range of motion;Decreased coordination;Obesity;Decreased activity tolerance;Decreased cognition;Decreased balance;Pain       PT Treatment Interventions DME instruction;Therapeutic exercise;Gait training;Balance training;Stair training;Functional mobility training;Cognitive remediation;Therapeutic activities;Patient/family education    PT Goals (Current goals can be found in the Care Plan section)  Acute Rehab PT Goals Patient Stated Goal: return home with husband PT Goal Formulation: With patient Time For Goal Achievement: 09/09/19 Potential to Achieve Goals: Fair    Frequency     Barriers to discharge        Co-evaluation               AM-PAC PT "6 Clicks" Mobility  Outcome Measure Help needed turning from your back to your side while in a flat bed without using bedrails?: A Lot Help needed moving from lying on your back to sitting on the side of a flat bed  without using bedrails?: A Lot Help needed moving to and from a bed to a chair (including a wheelchair)?: Total Help needed standing up from a chair using your arms (e.g., wheelchair or bedside chair)?: Total Help needed to walk in hospital room?: Total Help needed climbing 3-5 steps with a railing? : Total 6 Click Score: 8    End of Session Equipment Utilized During Treatment: Gait belt Activity Tolerance: Patient tolerated treatment well Patient left: in bed;with call bell/phone within reach;with bed alarm set Nurse Communication: Mobility status;Need for lift equipment PT Visit Diagnosis: Difficulty in walking, not elsewhere classified (R26.2);Muscle weakness (generalized) (M62.81);Pain Pain - Right/Left: Left Pain - part of body: Leg    Time: 2440-1027 PT Time Calculation (min) (ACUTE ONLY): 30 min   Charges:   PT Evaluation $PT Eval Moderate Complexity: 1 Mod PT Treatments $Therapeutic Activity: 8-22 mins        Karma Ganja, PT, DPT   Acute Rehabilitation Department Pager #: [phone]   Otho Bellows 08/26/2019, 12:14 PM

## 2019-08-26 NOTE — Progress Notes (Signed)
[place] for heparin Indication: atrial fibrillation  Allergies  Allergen Reactions  . Olmesartan Other (See Comments)    Hyperkalemia     Patient Measurements: Height: 5\' 6"  (167.6 cm) Weight: 93.6 kg (206 lb 5.6 oz) IBW/kg (Calculated) : 59.3 Heparin Dosing Weight: 82.1  Vital Signs: Temp: 99.5 F (37.5 C) (04/12 1104) Temp Source: Oral (04/12 1104) BP: 111/49 (04/12 1104) Pulse Rate: 83 (04/12 1104)  Labs: Recent Labs    08/25/19 0323 08/25/19 0323 08/25/19 1405 08/26/19 0103 08/26/19 0245 08/26/19 1054  HGB 8.2*   < > 8.5*  --  8.2*  --   HCT 25.9*  --  26.8*  --  25.6*  --   PLT 185  --  201  --  205  --   APTT  --   --   --  90*  --   --   LABPROT 16.2*  --   --   --   --   --   INR 1.3*  --   --   --   --   --   HEPARINUNFRC  --   --   --  0.25*  --  0.34  CREATININE 7.92*  --  8.71*  --  6.38*  --    < > = values in this interval not displayed.    Estimated Creatinine Clearance: 9.1 mL/min (A) (by C-G formula based on SCr of 6.38 mg/dL (H)).   Medical History: Past Medical History:  Diagnosis Date  . Arthritis   . Cancer [facility]) 2005    left breast  . CHF (congestive heart failure) ([place])   . Chronic back pain   . Chronic kidney disease 03/2014   dialysis t/th/sa  . Coronary artery disease   . Diabetes mellitus    Type 2  . Diabetic nephropathy ([place]) 01-08-13  . Dyslipidemia 01-08-13  . ESRD on hemodialysis ([place])    Tu, Th, Sat  . Headache   . Hyperlipidemia   . Hypertension   . LBBB (left bundle branch block)   . Proteinuria 01-08-13  . Pulmonary hypertension ([place])    PA peak pressure 73 mmHg 08/05/17 echo  . Thyroid disease 01-08-13   Hyper-parathyroidism-secondary  . Wears glasses     Medications:  Scheduled:  . amiodarone  200 mg Oral Daily  . aspirin EC  81 mg Oral Daily  . atorvastatin  80 mg Oral Daily  . clopidogrel  75 mg Oral Q breakfast  . collagenase   Topical Daily  . darbepoetin  (ARANESP) injection - DIALYSIS  60 mcg Intravenous Q Tue-HD  . docusate sodium  100 mg Oral BID  . doxercalciferol  3 mcg Intravenous Q T,Th,Sa-HD  . feeding supplement (NEPRO CARB STEADY)  237 mL Oral BID BM  . ferric citrate  420 mg Oral TID WC  . insulin aspart  0-5 Units Subcutaneous QHS  . insulin aspart  0-9 Units Subcutaneous TID WC  . midodrine  5 mg Oral TID WC  . multivitamin  1 tablet Oral QHS  . QUEtiapine  25 mg Oral QHS   Infusions:  . sodium chloride    . ceFEPime (MAXIPIME) IV 1 g (08/25/19 2054)  . heparin 1,050 Units/hr (08/26/19 1540)  . [START ON 08/27/2019] vancomycin    . 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 Eliquis held  and IV heparin started post-operatively.  Heparin level therapeutic, CBC stable this morning.  Goal of Therapy:  Heparin level 0.3-0.7 units/ml Monitor platelets by anticoagulation protocol: Yes   Plan:  -Continue heparin 1050 units/h -Daily heparin level and CBC   Arrie Senate, PharmD, BCPS Clinical Pharmacist [phone] Please check AMION for all [facility] Pharmacy numbers 08/26/2019

## 2019-08-26 NOTE — Progress Notes (Signed)
PROGRESS NOTE                                                                                                                                                                                                             Patient Demographics:    Alexis [name], is a 74 y.o. female, DOB - [birth year], PYP:[id number]  Admit date - 08/18/2019   Admitting Physician Shela Leff, MD  Outpatient Primary MD for the patient is System, Provider Not In  LOS - 8  No chief complaint on file.      Brief Narrative  [place] a 74 y.o.femalewith medical history significant ofA. fib on Eliquis, ESRD on HD TTS, CAD, pulmonary hypertension, LBBB, hypertension, hyperlipidemia, CHF, type 2 diabetes, diabetic neuropathy, [facility] with complaints of bilateral heel ulcers. Diagnosed with osteomyelitis of the right fifth metatarsal head and transferred to [facility] for further management as she is a dialysis patient.   Subjective:   Patient in bed, appears comfortable, denies any headache, no fever, no chest pain or pressure, no shortness of breath , no abdominal pain. No focal weakness.   Assessment  & Plan :     Diabetic foot ulcers/concern for osteomyelitis: Also neuropathic and vascular ulcers. 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 There is question of right fifth toe residual infection of note she has had partial resection in that toe as well in the past. She underwent revascularization this admission on 08/22/2019 by vascular surgery.  She received a drug-eluting stent and currently on dual antiplatelet therapy, will check a right foot MRI to rule out any chronic infections as she would require 6 weeks of antibiotic if osteomyelitis is proven.  Monitor postop H&H, commence PT OT, most likely will require SNF.  Paroxysmal atrial fibrillation: [place] vas 2 score of at least 3.  Continue amiodarone,  tolerated heparin well postop we will switch to Eliquis on 08/27/2019.  Anemia of chronic disease.  Hemoglobin stable.  ESRD on HD: Receiving dialysis on schedule as per nephrology. TTS.  Dementia with hospital-acquired delirium due to unfamiliar setting along with toxic encephalopathy due to infection.  Supportive care, minimize narcotics and benzodiazepines, family made aware that her delirium might get worse, as needed Haldol and nighttime Seroquel.    Insulin-dependent type 2 diabetes: Takes 70/30 insulin at home.  Blood sugars well controlled currently on sliding scale insulin.  Lab Results  Component Value Date  HGBA1C 6.2 (H) 08/18/2019   CBG (last 3)  Recent Labs    08/25/19 1630 08/25/19 2128 08/26/19 0619  GLUCAP 135* 141* 152*      Family Communication  :  Daughter [name] [phone], 08/24/19, Husband [name] on [phone], 08/24/19.  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  If stays stable likely discharge 08/28/2019 if bed can be arranged.  Consults  :  Ortho, Renal  Procedures  :    Left AKA on 08/25/2019 by Dr. Sharol Given.    R Leg - revascularization 08/22/2019 - " Intravascular lithotripsy, right superficial femoral and popliteal artery and Drug-eluting stent, right superficial femoral-popliteal artery" Continue to hold anticoagulation for anticipated surgical procedure.  Further management per orthopedics if planning any debridements.   DVT Prophylaxis  :  Heparin gtt Eliquis once stable from surgical standpoint.    Lab Results  Component Value Date   PLT 205 08/26/2019    Diet :  Diet Order            Diet renal with fluid restriction Fluid restriction: 1200 mL Fluid; Room service appropriate? No; Fluid consistency: Thin  Diet effective now               Inpatient Medications Scheduled Meds: . amiodarone   200 mg Oral Daily  . aspirin EC  81 mg Oral Daily  . atorvastatin  80 mg Oral Daily  . clopidogrel  75 mg Oral Q breakfast  . collagenase   Topical Daily  . darbepoetin (ARANESP) injection - DIALYSIS  60 mcg Intravenous Q Tue-HD  . docusate sodium  100 mg Oral BID  . doxercalciferol  3 mcg Intravenous Q T,Th,Sa-HD  . feeding supplement (NEPRO CARB STEADY)  237 mL Oral BID BM  . ferric citrate  420 mg Oral TID WC  . insulin aspart  0-5 Units Subcutaneous QHS  . insulin aspart  0-9 Units Subcutaneous TID WC  . midodrine  5 mg Oral TID WC  . multivitamin  1 tablet Oral QHS  . QUEtiapine  25 mg Oral QHS   Continuous Infusions: . sodium chloride    . ceFEPime (MAXIPIME) IV 1 g (08/25/19 2054)  . heparin 1,050 Units/hr (08/26/19 9509)  . [START ON 08/27/2019] vancomycin    . vancomycin     PRN Meds:.acetaminophen, albuterol, bisacodyl, haloperidol lactate, hydrALAZINE, HYDROmorphone (DILAUDID) injection, labetalol, magnesium citrate, nitroGLYCERIN, ondansetron (ZOFRAN) IV, oxyCODONE, polyethylene glycol  Antibiotics  :   Anti-infectives (From admission, onward)   Start     Dose/Rate Route Frequency Ordered Stop   08/27/19 1200  vancomycin (VANCOCIN) IVPB 1000 mg/200 mL premix     1,000 mg 200 mL/hr over 60 Minutes Intravenous Every T-Th-Sa (Hemodialysis) 08/24/19 1135     08/25/19 0700  ceFAZolin (ANCEF) IVPB 2g/100 mL premix  Status:  Discontinued     2 g 200 mL/hr over 30 Minutes Intravenous On call to O.R. 08/25/19 0645 08/25/19 0945   08/24/19 1200  vancomycin (VANCOCIN) IVPB 750 mg/150 ml premix     750 mg 150 mL/hr over 60 Minutes Intravenous Every T-Th-Sa (Hemodialysis) 08/24/19 1135 08/27/19 1159   08/22/19 1824  vancomycin (VANCOCIN) 1-5 GM/200ML-% IVPB    Note to Pharmacy: Ronny Bacon  : cabinet override      08/22/19 1824 08/23/19 0629   08/20/19 1200  vancomycin (VANCOCIN) IVPB  1000 mg/200 mL premix  Status:  Discontinued     1,000 mg 200 mL/hr over 60 Minutes  Intravenous Every T-Th-Sa (Hemodialysis) 08/19/19 1501 08/24/19 1135   08/19/19 2000  ceFEPIme (MAXIPIME) 1 g in sodium chloride 0.9 % 100 mL IVPB     1 g 200 mL/hr over 30 Minutes Intravenous Every 24 hours 08/18/19 2000     08/18/19 2015  ceFEPIme (MAXIPIME) 2 g in sodium chloride 0.9 % 100 mL IVPB     2 g 200 mL/hr over 30 Minutes Intravenous  Once 08/18/19 2000 08/18/19 2139   08/18/19 2015  vancomycin (VANCOREADY) IVPB 500 mg/100 mL     500 mg 100 mL/hr over 60 Minutes Intravenous  Once 08/18/19 2000 08/18/19 2339   08/18/19 2000  vancomycin variable dose per unstable renal function (pharmacist dosing)  Status:  Discontinued      Does not apply See admin instructions 08/18/19 2000 08/19/19 1501          Objective:   Vitals:   08/25/19 1629 08/25/19 1948 08/26/19 0443 08/26/19 1009  BP: (!) 117/49 (!) 120/56 (!) 108/54   Pulse: 82 76 75 83  Resp: 17 15 18 18   Temp: 98.2 F (36.8 C) 98 F (36.7 C) 97.8 F (36.6 C)   TempSrc: Oral Axillary Axillary   SpO2: 99% 99% 100% 100%  Weight:      Height:        SpO2: 100 % O2 Flow Rate (L/min): 4 L/min  Wt Readings from Last 3 Encounters:  08/25/19 93.6 kg  07/22/19 104.3 kg  06/11/19 98.4 kg     Intake/Output Summary (Last 24 hours) at 08/26/2019 1038 Last data filed at 08/26/2019 0500 Gross per 24 hour  Intake 529.73 ml  Output 1743 ml  Net -1213.27 ml     Physical Exam   Awake Alert, No new F.N deficits, Normal affect [place].AT,PERRAL Supple Neck,No JVD, No cervical lymphadenopathy appriciated.  Symmetrical Chest wall movement, Good air movement bilaterally, CTAB RRR,No Gallops, Rubs or new Murmurs, No Parasternal Heave +ve B.Sounds, Abd Soft, No tenderness, No organomegaly appriciated, No rebound - guarding or rigidity.  Left AKA stump appears stable with wound VAC, does have right heel ulcers as well.     Data Review:    Recent Labs  Lab 08/20/19 0420 08/20/19 0420 08/21/19 0630 08/24/19 0249  08/25/19 0323 08/25/19 1405 08/26/19 0245  WBC 8.7   < > 8.3 9.1 9.1 10.5 11.0*  HGB 9.2*   < > 9.6* 9.2* 8.2* 8.5* 8.2*  HCT 28.9*   < > 30.1* 29.2* 25.9* 26.8* 25.6*  PLT 227   < > 224 199 185 201 205  MCV 78.1*   < > 78.2* 78.9* 78.7* 78.6* 78.5*  MCH 24.9*   < > 24.9* 24.9* 24.9* 24.9* 25.2*  MCHC 31.8   < > 31.9 31.5 31.7 31.7 32.0  RDW 18.9*   < > 18.9* 18.9* 18.7* 18.7* 18.8*  LYMPHSABS 1.3  --  1.0 1.0 1.0  --  0.7  MONOABS 1.1*  --  1.1* 1.2* 1.0  --  1.2*  EOSABS 0.4  --  0.4 0.5 0.5  --  0.0  BASOSABS 0.1  --  0.1 0.1 0.1  --  0.0   < > = values in this interval not displayed.    Recent Labs  Lab 08/20/19 0420 08/20/19 0420 08/21/19 0630 08/24/19 0249 08/25/19 0323 08/25/19 1405 08/26/19 0245  NA 135   < > 136 136  137 136 139  K 3.7   < > 3.7 3.8 3.5 4.7 4.3  CL 94*   < > 95* 97* 100 98 99  CO2 24   < > 26 24 24 23 25   GLUCOSE 114*   < > 132* 130* 151* 189* 133*  BUN 50*   < > 28* 25* 32* 36* 24*  CREATININE 9.46*   < > 6.80* 6.69* 7.92* 8.71* 6.38*  CALCIUM 8.3*   < > 8.6* 9.0 8.6* 9.0 8.7*  AST 12*  --  12* 14*  --   --   --   ALT 10  --  6 7  --   --   --   ALKPHOS 48  --  49 48  --   --   --   BILITOT 0.8  --  0.6 1.0  --   --   --   ALBUMIN 2.9*  --  2.8* 2.8*  --  2.4*  --   MG  --   --   --  1.9 1.8  --   --   CRP  --   --   --   --  11.5*  --  11.5*  INR  --   --   --   --  1.3*  --   --    < > = values in this interval not displayed.    Recent Labs  Lab 08/22/19 0425 08/25/19 0323 08/26/19 0245  CRP  --  11.5* 11.5*  SARSCOV2NAA NEGATIVE  --   --     ------------------------------------------------------------------------------------------------------------------ No results for input(s): CHOL, HDL, LDLCALC, TRIG, CHOLHDL, LDLDIRECT in the last 72 hours.  Lab Results  Component Value Date   HGBA1C 6.2 (H) 08/18/2019   ------------------------------------------------------------------------------------------------------------------ No  results for input(s): TSH, T4TOTAL, T3FREE, THYROIDAB in the last 72 hours.  Invalid input(s): FREET3 ------------------------------------------------------------------------------------------------------------------ No results for input(s): VITAMINB12, FOLATE, FERRITIN, TIBC, IRON, RETICCTPCT in the last 72 hours.  Coagulation profile Recent Labs  Lab 08/25/19 0323  INR 1.3*    No results for input(s): DDIMER in the last 72 hours.  Cardiac Enzymes No results for input(s): CKMB, TROPONINI, MYOGLOBIN in the last 168 hours.  Invalid input(s): CK ------------------------------------------------------------------------------------------------------------------    Component Value Date/Time   BNP 1,371.0 (H) 08/13/2017 1147    Micro Results Recent Results (from the past 240 hour(s))  Culture, blood (routine x 2)     Status: None   Collection Time: 08/18/19  8:09 PM   Specimen: BLOOD LEFT HAND  Result Value Ref Range Status   Specimen Description BLOOD LEFT HAND  Final   Special Requests   Final    BOTTLES DRAWN AEROBIC AND ANAEROBIC Blood Culture adequate volume   Culture   Final    NO GROWTH 5 DAYS Performed at [facility], [place] [address], [place], [place] [address]    Report Status 08/23/2019 FINAL  Final  Culture, blood (routine x 2)     Status: None   Collection Time: 08/18/19  8:17 PM   Specimen: BLOOD LEFT HAND  Result Value Ref Range Status   Specimen Description BLOOD LEFT HAND  Final   Special Requests   Final    BOTTLES DRAWN AEROBIC AND ANAEROBIC Blood Culture adequate volume   Culture   Final    NO GROWTH 5 DAYS Performed at [facility], [place] [address] [place], [place] [address]    Report Status 08/23/2019 FINAL  Final  MRSA  PCR Screening     Status: None   Collection Time: 08/19/19  8:23 AM   Specimen: Nasal Mucosa; Nasopharyngeal  Result Value Ref Range Status   MRSA by PCR NEGATIVE NEGATIVE Final    Comment:        The GeneXpert MRSA  Assay (FDA approved for NASAL specimens only), is one component of a comprehensive MRSA colonization surveillance program. It is not intended to diagnose MRSA infection nor to guide or monitor treatment for MRSA infections. Performed at [facility], [place] [address] [place], [place] [address]   SARS CORONAVIRUS 2 (TAT 6-24 HRS) Nasopharyngeal Nasopharyngeal Swab     Status: None   Collection Time: 08/22/19  4:25 AM   Specimen: Nasopharyngeal Swab  Result Value Ref Range Status   SARS Coronavirus 2 NEGATIVE NEGATIVE Final    Comment: (NOTE) SARS-CoV-2 target nucleic acids are NOT DETECTED. The SARS-CoV-2 RNA is generally detectable in upper and lower respiratory specimens during the acute phase of infection. Negative results do not preclude SARS-CoV-2 infection, do not rule out co-infections with other pathogens, and should not be used as the sole basis for treatment or other patient management decisions. Negative results must be combined with clinical observations, patient history, and epidemiological information. The expected result is Negative. Fact Sheet for Patients: SugarRoll.be Fact Sheet for Healthcare Providers: https://www.woods-mathews.com/ This test is not yet approved or cleared by the [place] FDA and  has been authorized for detection and/or diagnosis of SARS-CoV-2 by FDA under an Emergency Use Authorization (EUA). This EUA will remain  in effect (meaning this test can be used) for the duration of the COVID-19 declaration under Section 56 4(b)(1) of the Act, 21 U.S.C. section 360bbb-3(b)(1), unless the authorization is terminated or revoked sooner. Performed at [facility], [place] [address] [place],  [address]     Radiology Reports DG Chest 1 View  Result Date: 08/18/2019 CLINICAL DATA:  [place]. EXAM: CHEST  1 VIEW COMPARISON:  May 29, 2019 FINDINGS: There is no evidence of acute infiltrate,  pleural effusion or pneumothorax. There is stable marked severity enlargement of the cardiac silhouette. Multiple radiopaque surgical clips are seen along the left axilla. Degenerative changes seen throughout the thoracic spine. IMPRESSION: Stable cardiomegaly without evidence of acute or active cardiopulmonary disease. Electronically Signed   By: Virgina Norfolk M.D.   On: 08/18/2019 21:45   DG Ankle Complete Left  Result Date: 08/19/2019 CLINICAL DATA:  Pain and swelling. Osteomyelitis. EXAM: LEFT ANKLE COMPLETE - 3+ VIEW COMPARISON:  None. FINDINGS: There is no acute displaced fracture or dislocation. There is osteopenia which limits detection of nondisplaced fractures. There may be ulceration overlying the medial malleolus without definite evidence for underlying osteomyelitis. There is a moderate-sized Achilles tendon enthesophyte. There is a small plantar calcaneal spur. Advanced vascular calcifications are noted. IMPRESSION: 1. No acute displaced fracture or dislocation. 2. Possible ulceration overlying the medial malleolus without definite evidence for underlying osteomyelitis. 3. Advanced vascular calcifications. Electronically Signed   By: Constance Holster M.D.   On: 08/19/2019 19:56   DG Ankle Complete Right  Result Date: 08/19/2019 CLINICAL DATA:  Osteomyelitis of the ankle and foot. EXAM: RIGHT ANKLE - COMPLETE 3+ VIEW COMPARISON:  February 01, 2013 FINDINGS: There are advanced degenerative changes of the ankle mortise with surrounding soft tissue swelling. There is no acute displaced fracture. No dislocation. Advanced degenerative changes are noted of the midfoot. There is a moderate-sized plantar calcaneal spur. There is an Achilles tendon  enthesophyte. Vascular calcifications are noted. There is no radiographic evidence for osteomyelitis. IMPRESSION: 1. No acute osseous abnormality. 2. Advanced degenerative changes are noted of the midfoot and ankle mortise. Electronically Signed   By:  Constance Holster M.D.   On: 08/19/2019 19:57   DG Abd 1 View  Result Date: 08/18/2019 CLINICAL DATA:  Vomiting, constipation EXAM: ABDOMEN - 1 VIEW COMPARISON:  05/12/2019 FINDINGS: There is a non obstructive bowel gas pattern. No supine evidence of free air. No organomegaly or suspicious calcification. No acute bony abnormality. Aortoiliac atherosclerosis. IMPRESSION: No acute findings. Electronically Signed   By: Rolm Baptise M.D.   On: 08/18/2019 21:40   PERIPHERAL VASCULAR CATHETERIZATION  Result Date: 08/22/2019 Patient name: JODA BRAATZ MRN: [id number] DOB: 01/15/1946 Sex: female 08/22/2019 Pre-operative Diagnosis: Bilateral lower extremity ulcer Post-operative diagnosis:  Same Surgeon:  [name] Procedure Performed:  1.  Ultrasound-guided access, right femoral artery  2.  Ultrasound-guided access, left femoral artery  3.  Abdominal aortogram  4.  Bilateral lower extremity runoff  5.  Intravascular lithotripsy, right superficial femoral and popliteal artery  6.  Drug-eluting stent, right superficial femoral-popliteal artery  7.  Conscious sedation, 143 minutes Indications: The patient has extensive bilateral lower extremity ulcers.  She is here today for limb salvage. Procedure:  The patient was identified in the holding area and taken to room 8.  The patient was then placed supine on the table and prepped and draped in the usual sterile fashion.  A time out was called.  Conscious sedation was administered with the use of IV fentanyl and Versed under continuous physician and nurse monitoring.  Heart rate, blood pressure, and oxygen saturation were continuously monitored.  Total sedation time was 143 minutes.  I initially tried to get access into the left common femoral artery, however I was unsuccessful therefore I turned my attention towards the right femoral artery.  Ultrasound was used to evaluate the right common femoral artery.  It was patent .  A digital ultrasound image was acquired.  A  micropuncture needle was used to access the right common femoral artery under ultrasound guidance.  An 018 wire was advanced without resistance and a micropuncture sheath was placed.  The 018 wire was removed and a benson wire was placed.  The micropuncture sheath was exchanged for a 5 french sheath.  An omniflush catheter was advanced over the wire to the level of L-1.  An abdominal angiogram was obtained.  Next, the cath was pulled out of the aortic bifurcation and bilateral runoff was performed.  I then followed up for the intervention by gaining access under ultrasound guidance of the left common femoral artery. Findings:  Aortogram: No significant infrarenal aortic stenosis.  Bilateral common and external iliac arteries are patent throughout the course however they are heavily calcified.   Right Lower Extremity:  The right common femoral and profundofemoral artery are heavily calcified but patent throughout their course.  The superficial femoral artery is diffusely diseased with multiple high-grade lesions down to the adductor canal where it occludes.  There is reconstitution of the popliteal artery just below the joint space.  There is two-vessel runoff via the peroneal and posterior tibial artery.  Left Lower Extremity: The common femoral and profundofemoral artery are patent without stenosis but heavily calcified.  The superficial femoral artery is occluded with reconstitution of the superficial femoral artery at the adductor canal with diffusely diseased three-vessel runoff and minimal opacification of the digital arteries. Intervention: After the above  images were acquired the decision made to proceed with intervention.  The sheath in the right groin was removed.  I tried to use a minx however this failed because the balloon popped.  Manual pressure was held for 10 minutes.  I then placed a 7 x 45 sheath from the left groin into the right external iliac artery and fully heparinized the patient.  Using a  quick cross catheter and 035 Glidewire followed by a Glidewire advantage I was able to get across the stenosis.  The lesion was predilated with a 4 x 100 balloon.  I then performed shockwave intravascular ultrasound of the popliteal artery just distal to the joint space up to its origin.  A 5 x 60 balloon was used for the distal half and a 6 x 60 balloon was used for the proximal half.  Total treatment time was 510 seconds.  I then elected to stent the treated area.  A 5 x 100 Tigris stent was placed distally followed by Elluvia 6 x 120, 7 x 120, and 7 x 40.  The stents were then molded with a 5 mm balloon and completion imaging showed inline flow down across the foot.  Catheters and wires were removed.  A short 7 French sheath was placed.  Patient tolerated the procedure well there were no complications. 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  Successfully recanalized and treated with shockwave intravascular lithotripsy followed by drug-eluting stent placement from just below the joint space in the popliteal artery up to the origin of the superficial femoral artery.  There is two-vessel runoff via the peroneal and posterior tibial artery.  #2  Left superficial femoral artery occlusion  V. [name], M.D., [facility] [place] Office: [phone] Pager:  [phone]  DG Foot 2 Views Left  Result Date: 08/19/2019 CLINICAL DATA:  Osteomyelitis of the ankle or foot. EXAM: LEFT FOOT - 2 VIEW COMPARISON:  08/18/2019 FINDINGS: Again noted is a foreign body in the medial soft tissues of the left foot. There are degenerative changes of the interphalangeal joints. Advanced vascular calcifications are noted. There is a moderate-sized plantar calcaneal spur. There is no acute displaced fracture or dislocation. There is no definite radiographic evidence for osteomyelitis. IMPRESSION: 1. No definite radiographic evidence for osteomyelitis. 2.  Stable foreign body in the medial soft tissues of the left foot. 3. Degenerative changes of the interphalangeal joints. Electronically Signed   By: Constance Holster M.D.   On: 08/19/2019 19:55   DG Foot Complete Right  Result Date: 08/19/2019 CLINICAL DATA:  Osteomyelitis. 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 There is a lucency at the head of the fifth metatarsal. There is some surrounding soft tissue swelling. Vascular calcifications are noted. There are degenerative changes of the midfoot. IMPRESSION: 1. No acute displaced fracture or dislocation. 2. 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 There is a persistent ill-defined lucency at the head of the fifth metatarsal which can be seen in patients with osteomyelitis. Electronically Signed   By: Constance Holster M.D.   On: 08/19/2019 19:59   VAS [place] ABI WITH/WO TBI  Result Date: 08/21/2019 LOWER EXTREMITY DOPPLER STUDY Indications: Gangrene, and peripheral artery disease. High Risk Factors: Diabetes.  Performing Technologist: June Leap Rvt, Rdms  Examination Guidelines: A complete evaluation includes at minimum,  Doppler waveform signals and systolic blood pressure reading at the level of bilateral brachial, anterior tibial, and posterior tibial arteries, when vessel segments are accessible. Bilateral testing is considered an integral part of a complete examination. Photoelectric Plethysmograph (PPG) waveforms and toe systolic pressure readings are included as required and additional duplex testing as needed. Limited examinations for reoccurring indications may be performed as noted.  ABI Findings: +---------+-----------------+-----+------------------+-------------------------+ Right    Rt Pressure      IndexWaveform          Comment                            (mmHg)                                                             +---------+-----------------+-----+------------------+-------------------------+ Brachial                                         BP not taken-restricted                                                    arm                       +---------+-----------------+-----+------------------+-------------------------+ ATA      206              1.02 dampened                                                                   monophasic                                  +---------+-----------------+-----+------------------+-------------------------+ PTA      147              0.73 dampened                                                                   monophasic                                  +---------+-----------------+-----+------------------+-------------------------+ Great Toe                      Abnormal                                    +---------+-----------------+-----+------------------+-------------------------+ +---------+------------------+-----+-------------------+-------+  Left     Lt Pressure (mmHg)IndexWaveform           Comment +---------+------------------+-----+-------------------+-------+ Brachial 201                    triphasic                  +---------+------------------+-----+-------------------+-------+ ATA      97                0.48 dampened monophasic        +---------+------------------+-----+-------------------+-------+ PTA      19                0.09 dampened monophasic        +---------+------------------+-----+-------------------+-------+ Great Toe                       Abnormal                   +---------+------------------+-----+-------------------+-------+ Great toe PPG waveform too dampened to obtain pressures bilaterally.  Summary: Right: ABI is within normal range by ATA pressure, however abnormal pedal artery waveforms suggest this is falsely elevated. ABI is in the moderate range based on PTA  pressure. Severely dampened PPG waveform of Great toe. Left: Resting left ankle-brachial index indicates severe left lower extremity arterial disease. Severely dampened PPG waveform of Great toe.  *See table(s) above for measurements and observations.  Electronically signed by Servando Snare MD on 08/21/2019 at 5:13:35 PM.    Final     Time Spent in minutes  30   Lala Lund M.D on 08/26/2019 at 10:38 AM  To page go to [email or web address] - password [facility]

## 2019-08-26 NOTE — Progress Notes (Signed)
POD 1  S/P AKA. Patient alert lying in bed. Denies pain or phantom pain.  Wound vac with good seal 0cc in canister.  Plan : PT/OT SNF. Wound VAC for 1 week.

## 2019-08-26 NOTE — Progress Notes (Signed)
Patient ID: Alexis [name], female   DOB: June 04, 1945, 74 y.o.   MRN: [id number]  [facility] Progress Note   Assessment/ Plan:   1.  Status post left above-knee amputation for osteomyelitis/peripheral vascular disease and bilateral leg ulcers in diabetic patient.  Underwent angiography/stenting for revascularization of right lower extremity on 4/9. 2. ESRD: Continue hemodialysis on a TTS schedule, she was off schedule over the weekend due to problems with staffing/patient census and ultimately underwent hemodialysis yesterday night. 3. Anemia: Postoperative hemoglobin/hematocrit lower as anticipated with blood loss/inflammatory resistance to ESA.  We will continue to follow trend while on ESA. 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  Continue Hectorol for PTH suppression and Auryxia for phosphorus binding. 5. Nutrition: Continue renal diet with protein supplementation. 6.  Insulin-dependent diabetes mellitus: Appears to be well controlled at this time on SSI.  Subjective:   Reports to be feeling uncomfortable from surgical site.  PT at bedside.  Per nursing staff, refusing food.   Objective:   BP (!) 108/54 (BP Location: Left Arm)   Pulse 75   Temp 97.8 F (36.6 C) (Axillary)   Resp 18   Ht 5\' 6"  (1.676 m)   Wt 93.6 kg   SpO2 100%   BMI 33.31 kg/m   Physical Exam: Gen: Appears uncomfortable resting in bed CVS: Pulse regular rhythm, normal rate, S1 and S2 normal Resp: Clear to auscultation, no rales/rhonchi Abd: Soft, obese, nontender Ext: Status post left above-knee amputation with wound VAC.  Right heel ulcers noted.  Right forearm AVG.  Labs: BMET Recent Labs  Lab 08/20/19 0420 08/21/19 0630 08/24/19 0249 08/25/19 0323 08/25/19 1405 08/26/19 0245  NA 135 136 136 137 136 139  K 3.7 3.7 3.8 3.5 4.7 4.3  CL 94* 95* 97* 100 98 99  CO2 24 26 24 24 23 25   GLUCOSE 114* 132* 130* 151* 189* 133*  BUN 50* 28* 25* 32* 36* 24*  CREATININE  9.46* 6.80* 6.69* 7.92* 8.71* 6.38*  CALCIUM 8.3* 8.6* 9.0 8.6* 9.0 8.7*  PHOS  --   --  3.9  --  5.5*  --    CBC Recent Labs  Lab 08/21/19 0630 08/21/19 0630 08/24/19 0249 08/25/19 0323 08/25/19 1405 08/26/19 0245  WBC 8.3   < > 9.1 9.1 10.5 11.0*  NEUTROABS 5.8  --  6.2 6.5  --  8.9*  HGB 9.6*   < > 9.2* 8.2* 8.5* 8.2*  HCT 30.1*   < > 29.2* 25.9* 26.8* 25.6*  MCV 78.2*   < > 78.9* 78.7* 78.6* 78.5*  PLT 224   < > 199 185 201 205   < > = values in this interval not displayed.     Medications:    . amiodarone  200 mg Oral Daily  . aspirin EC  81 mg Oral Daily  . atorvastatin  80 mg Oral Daily  . clopidogrel  75 mg Oral Q breakfast  . collagenase   Topical Daily  . darbepoetin (ARANESP) injection - DIALYSIS  60 mcg Intravenous Q Tue-HD  . docusate sodium  100 mg Oral BID  . doxercalciferol  3 mcg Intravenous Q T,Th,Sa-HD  . feeding supplement (NEPRO CARB STEADY)  237 mL Oral BID BM  . ferric citrate  420 mg Oral TID WC  . insulin aspart  0-5 Units Subcutaneous QHS  . insulin aspart  0-9 Units Subcutaneous TID WC  . midodrine  5 mg Oral TID WC  . multivitamin  1 tablet Oral QHS  . QUEtiapine  25 mg Oral QHS   Elmarie Shiley, MD 08/26/2019, 8:59 AM

## 2019-08-27 DIAGNOSIS — N186 End stage renal disease: Secondary | ICD-10-CM | POA: Diagnosis not present

## 2019-08-27 DIAGNOSIS — E11621 Type 2 diabetes mellitus with foot ulcer: Secondary | ICD-10-CM | POA: Diagnosis not present

## 2019-08-27 DIAGNOSIS — M86172 Other acute osteomyelitis, left ankle and foot: Secondary | ICD-10-CM | POA: Diagnosis not present

## 2019-08-27 DIAGNOSIS — M869 Osteomyelitis, unspecified: Secondary | ICD-10-CM | POA: Diagnosis not present

## 2019-08-27 LAB — CBC WITH DIFFERENTIAL/PLATELET
Abs Immature Granulocytes: 0.06 10*3/uL (ref 0.00–0.07)
Basophils Absolute: 0.1 10*3/uL (ref 0.0–0.1)
Basophils Relative: 1 %
Eosinophils Absolute: 0.4 10*3/uL (ref 0.0–0.5)
Eosinophils Relative: 4 %
HCT: 24.6 % — ABNORMAL LOW (ref 36.0–46.0)
Hemoglobin: 7.8 g/dL — ABNORMAL LOW (ref 12.0–15.0)
Immature Granulocytes: 1 %
Lymphocytes Relative: 14 %
Lymphs Abs: 1.4 10*3/uL (ref 0.7–4.0)
MCH: 25.1 pg — ABNORMAL LOW (ref 26.0–34.0)
MCHC: 31.7 g/dL (ref 30.0–36.0)
MCV: 79.1 fL — ABNORMAL LOW (ref 80.0–100.0)
Monocytes Absolute: 1.6 10*3/uL — ABNORMAL HIGH (ref 0.1–1.0)
Monocytes Relative: 16 %
Neutro Abs: 6.5 10*3/uL (ref 1.7–7.7)
Neutrophils Relative %: 64 %
Platelets: 196 10*3/uL (ref 150–400)
RBC: 3.11 MIL/uL — ABNORMAL LOW (ref 3.87–5.11)
RDW: 18.7 % — ABNORMAL HIGH (ref 11.5–15.5)
WBC: 10 10*3/uL (ref 4.0–10.5)
nRBC: 0.3 % — ABNORMAL HIGH (ref 0.0–0.2)

## 2019-08-27 LAB — RENAL FUNCTION PANEL
Albumin: 2.2 g/dL — ABNORMAL LOW (ref 3.5–5.0)
Anion gap: 15 (ref 5–15)
BUN: 42 mg/dL — ABNORMAL HIGH (ref 8–23)
CO2: 24 mmol/L (ref 22–32)
Calcium: 8.5 mg/dL — ABNORMAL LOW (ref 8.9–10.3)
Chloride: 96 mmol/L — ABNORMAL LOW (ref 98–111)
Creatinine, Ser: 8.69 mg/dL — ABNORMAL HIGH (ref 0.44–1.00)
GFR calc Af Amer: 5 mL/min — ABNORMAL LOW (ref >60)
GFR calc non Af Amer: 4 mL/min — ABNORMAL LOW (ref >60)
Glucose, Bld: 133 mg/dL — ABNORMAL HIGH (ref 70–99)
Phosphorus: 4.7 mg/dL — ABNORMAL HIGH (ref 2.5–4.6)
Potassium: 4.2 mmol/L (ref 3.5–5.1)
Sodium: 135 mmol/L (ref 135–145)

## 2019-08-27 LAB — GLUCOSE, CAPILLARY
Glucose-Capillary: 171 mg/dL — ABNORMAL HIGH (ref 70–99)
Glucose-Capillary: 137 mg/dL — ABNORMAL HIGH (ref 70–99)
Glucose-Capillary: 100 mg/dL — ABNORMAL HIGH (ref 70–99)
Glucose-Capillary: 104 mg/dL — ABNORMAL HIGH (ref 70–99)

## 2019-08-27 LAB — HEPARIN LEVEL (UNFRACTIONATED): Heparin Unfractionated: 0.3 IU/mL (ref 0.30–0.70)

## 2019-08-27 MED ORDER — PANTOPRAZOLE SODIUM 40 MG PO TBEC
40.0000 mg | DELAYED_RELEASE_TABLET | Freq: Every day | ORAL | Status: DC
  Administered 2019-08-28 – 2019-08-31 (×4): 40 mg via ORAL
  Filled 2019-08-27 (×3): qty 1

## 2019-08-27 MED ORDER — SODIUM CHLORIDE 0.9 % IV SOLN
2.0000 g | INTRAVENOUS | Status: DC
  Administered 2019-08-27: 2 g via INTRAVENOUS
  Filled 2019-08-27: qty 2

## 2019-08-27 MED ORDER — DARBEPOETIN ALFA 60 MCG/0.3ML IJ SOSY
PREFILLED_SYRINGE | INTRAMUSCULAR | Status: AC
  Administered 2019-08-27: 60 ug via INTRAVENOUS
  Filled 2019-08-27: qty 0.3

## 2019-08-27 MED ORDER — VANCOMYCIN HCL IN DEXTROSE 1-5 GM/200ML-% IV SOLN
INTRAVENOUS | Status: AC
  Administered 2019-08-27: 1000 mg via INTRAVENOUS
  Filled 2019-08-27: qty 200

## 2019-08-27 MED ORDER — SODIUM CHLORIDE 0.9 % IV SOLN
1.0000 g | INTRAVENOUS | Status: DC
  Filled 2019-08-27: qty 1

## 2019-08-27 MED ORDER — HEPARIN SODIUM (PORCINE) 1000 UNIT/ML IJ SOLN
INTRAMUSCULAR | Status: AC
  Administered 2019-08-27: 3700 [IU] via INTRAVENOUS_CENTRAL
  Filled 2019-08-27: qty 4

## 2019-08-27 MED ORDER — OXYCODONE HCL 5 MG PO TABS
5.0000 mg | ORAL_TABLET | Freq: Four times a day (QID) | ORAL | Status: DC | PRN
  Administered 2019-08-29: 5 mg via ORAL
  Filled 2019-08-27 (×3): qty 1

## 2019-08-27 MED ORDER — HEPARIN SODIUM (PORCINE) 5000 UNIT/ML IJ SOLN
5000.0000 [IU] | Freq: Three times a day (TID) | INTRAMUSCULAR | Status: DC
  Administered 2019-08-27 – 2019-08-29 (×5): 5000 [IU] via SUBCUTANEOUS
  Filled 2019-08-27 (×5): qty 1

## 2019-08-27 MED ORDER — HEPARIN SODIUM (PORCINE) 1000 UNIT/ML DIALYSIS: 40.0000 [IU]/kg | INTRAMUSCULAR | Status: DC | PRN

## 2019-08-27 MED ORDER — DOXERCALCIFEROL 4 MCG/2ML IV SOLN
INTRAVENOUS | Status: AC
  Administered 2019-08-27: 3 ug via INTRAVENOUS
  Filled 2019-08-27: qty 2

## 2019-08-27 MED ORDER — VANCOMYCIN HCL IN DEXTROSE 1-5 GM/200ML-% IV SOLN
1000.0000 mg | INTRAVENOUS | Status: DC
  Filled 2019-08-27: qty 200

## 2019-08-27 NOTE — Anesthesia Postprocedure Evaluation (Signed)
Anesthesia Post Note  Patient: Alexis [name]  Procedure(s) Performed: LEFT AMPUTATION ABOVE KNEE (Left Knee)     Patient location during evaluation: PACU Anesthesia Type: General Level of consciousness: awake and alert Pain management: pain level controlled Vital Signs Assessment: post-procedure vital signs reviewed and stable Respiratory status: spontaneous breathing, nonlabored ventilation, respiratory function stable and patient connected to nasal cannula oxygen Cardiovascular status: blood pressure returned to baseline and stable Postop Assessment: no apparent nausea or vomiting Anesthetic complications: no    Last Vitals:  Vitals:   08/26/19 2011 08/27/19 0609  BP: (!) 92/48 (!) 104/54  Pulse: 82 85  Resp: 19 20  Temp: 37.6 C 37.2 C  SpO2: 100% 100%    Last Pain:  Vitals:   08/27/19 0802  TempSrc:   PainSc: 0-No pain                 Orby Tangen S

## 2019-08-27 NOTE — Procedures (Signed)
Patient seen on Hemodialysis. BP (!) 109/43 (BP Location: Left Arm)   Pulse 79   Temp 98.2 F (36.8 C) (Oral)   Resp 19   Ht 5\' 6"  (1.676 m)   Wt 93.6 kg   SpO2 95%   BMI 33.31 kg/m   QB 400, UF goal 2L Tolerating treatment without complaints at this time.   Elmarie Shiley MD [facility]. Office # [phone] Pager # [phone] 2:17 PM

## 2019-08-27 NOTE — Progress Notes (Signed)
Patient ID: KEARI MIU, female   DOB: 13-Jan-1946, 74 y.o.   MRN: [id number]  [facility] Progress Note   Assessment/ Plan:   1.  Status post left above-knee amputation (POD #2)  for osteomyelitis/peripheral vascular disease and bilateral leg ulcers in diabetic patient.  Underwent angiography/stenting for revascularization of right lower extremity on 08/22/19 by vascular surgery. 2. ESRD: She is on schedule for hemodialysis today to continue her usual outpatient TTS dialysis schedule.  No evidence of florid volume overload. 3. Anemia: Postoperative hemoglobin/hematocrit lower as anticipated with blood loss/inflammatory resistance to ESA.  We will continue to follow trend while on ESA. 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  Continue Hectorol for PTH suppression and Auryxia for phosphorus binding. 5. Nutrition: Continue renal diet with protein supplementation. 6.  Insulin-dependent diabetes mellitus: Appears to be well controlled at this time on SSI.  Subjective:   Reports continued pain over surgical site overnight that limited her ability to rest.   Objective:   BP (!) 104/54 (BP Location: Left Arm)   Pulse 85   Temp 99 F (37.2 C) (Oral)   Resp 20   Ht 5\' 6"  (1.676 m)   Wt 93.6 kg   SpO2 100%   BMI 33.31 kg/m   Physical Exam: Gen: Sitting up comfortably eating breakfast in bed. CVS: Pulse regular rhythm, normal rate, S1 and S2 normal Resp: Clear to auscultation, no rales/rhonchi Abd: Soft, obese, nontender Ext: Status post left above-knee amputation with wound VAC.  Right heel ulcers noted.  Right forearm AVG with dressings from previous cannulation.  Labs: BMET Recent Labs  Lab 08/21/19 0630 08/24/19 0249 08/25/19 0323 08/25/19 1405 08/26/19 0245  NA 136 136 137 136 139  K 3.7 3.8 3.5 4.7 4.3  CL 95* 97* 100 98 99  CO2 26 24 24 23 25   GLUCOSE 132* 130* 151* 189* 133*  BUN 28* 25* 32* 36* 24*  CREATININE 6.80* 6.69* 7.92* 8.71*  6.38*  CALCIUM 8.6* 9.0 8.6* 9.0 8.7*  PHOS  --  3.9  --  5.5*  --    CBC Recent Labs  Lab 08/24/19 0249 08/24/19 0249 08/25/19 0323 08/25/19 1405 08/26/19 0245 08/27/19 0234  WBC 9.1   < > 9.1 10.5 11.0* 10.0  NEUTROABS 6.2  --  6.5  --  8.9* 6.5  HGB 9.2*   < > 8.2* 8.5* 8.2* 7.8*  HCT 29.2*   < > 25.9* 26.8* 25.6* 24.6*  MCV 78.9*   < > 78.7* 78.6* 78.5* 79.1*  PLT 199   < > 185 201 205 196   < > = values in this interval not displayed.     Medications:    . amiodarone  200 mg Oral Daily  . aspirin EC  81 mg Oral Daily  . atorvastatin  80 mg Oral Daily  . clopidogrel  75 mg Oral Q breakfast  . collagenase   Topical Daily  . darbepoetin (ARANESP) injection - DIALYSIS  60 mcg Intravenous Q Tue-HD  . docusate sodium  100 mg Oral BID  . doxercalciferol  3 mcg Intravenous Q T,Th,Sa-HD  . feeding supplement (NEPRO CARB STEADY)  237 mL Oral TID BM  . feeding supplement (PRO-STAT SUGAR FREE 64)  30 mL Oral BID  . ferric citrate  420 mg Oral TID WC  . insulin aspart  0-5 Units Subcutaneous QHS  . insulin aspart  0-9 Units Subcutaneous TID WC  . midodrine  5 mg Oral TID  WC  . multivitamin  1 tablet Oral QHS  . QUEtiapine  25 mg Oral QHS   Elmarie Shiley, MD 08/27/2019, 8:27 AM

## 2019-08-27 NOTE — Progress Notes (Signed)
PROGRESS NOTE                                                                                                                                                                                                             Patient Demographics:    Alexis [name], is a 74 y.o. female, DOB - [birth year], CNO:[id number]  Admit date - 08/18/2019   Admitting Physician Shela Leff, MD  Outpatient Primary MD for the patient is System, Provider Not In  LOS - 9  No chief complaint on file.      Brief Narrative  Bancroft a 74 y.o.femalewith medical history significant ofA. fib on Eliquis, ESRD on HD TTS, CAD, pulmonary hypertension, LBBB, hypertension, hyperlipidemia, CHF, type 2 diabetes, diabetic neuropathy, [facility]. Diagnosed with osteomyelitis of the right fifth metatarsal head and transferred to [facility] for further management as she is a dialysis patient.   Subjective:   Patient in bed, appears comfortable, denies any headache, no fever, no chest pain or pressure, no shortness of breath , no abdominal pain. No focal weakness.   Assessment  & Plan :     Diabetic foot ulcers/concern for osteomyelitis: Also neuropathic and vascular ulcers. 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 There is question of distal fibula having signal suggestive of possible infection noted by Dr. Sharol Given on MRI and previous imaging, she has underwent revascularization this admission on 08/22/2019 by vascular surgery and received a drug-eluting stent and currently on dual antiplatelet therapy, discussed with Dr. Sharol Given on 08/27/2019, according to him she may require another visit to the OR for right distal fibula debridement, for now continue empiric IV antibiotics.  Monitor postop H&H, commence PT OT, most likely will require SNF.  Paroxysmal atrial fibrillation: [place] vas 2  score of at least 3.  Continue amiodarone, tolerated heparin well postop we will switch to Eliquis on 08/27/2019.  Anemia of chronic disease.  Hemoglobin stable.  Some oozing in the wound VAC of the left AKA stump, hold heparin for 24 hours, currently she is on dual antiplatelet therapy for the new DES stent in the right leg.  Will monitor H&H and address.  Type screen done May require a unit of packed RBC with next dialysis have already gotten consent from the husband for it if needed.  ESRD on HD: Receiving dialysis on schedule as per nephrology. TTS.  Dementia with hospital-acquired  delirium due to unfamiliar setting along with toxic encephalopathy due to infection.  Supportive care, minimize narcotics and benzodiazepines, family made aware that her delirium might get worse, as needed Haldol and nighttime Seroquel.    Insulin-dependent type 2 diabetes: Takes 70/30 insulin at home.  Blood sugars well controlled currently on sliding scale insulin.  Lab Results  Component Value Date   HGBA1C 6.2 (H) 08/18/2019   CBG (last 3)  Recent Labs    08/26/19 2200 08/27/19 0620 08/27/19 1106  GLUCAP 222* 171* 137*      Family Communication  :  Daughter Alexis [name] [phone], 08/24/19, Husband Alexis [name] on [phone], 08/24/19.  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  Consults  :  Ortho, Renal  Procedures  :    MRI R foot - Non acute  Left AKA on 08/25/2019 by Dr. Sharol Given.    R Leg - revascularization 08/22/2019 - " Intravascular lithotripsy, right superficial femoral and popliteal artery and Drug-eluting stent, right superficial femoral-popliteal artery" Continue to hold anticoagulation for anticipated surgical procedure.  Further management per orthopedics if  planning any debridements.   DVT Prophylaxis  :  Heparin gtt Eliquis once stable from surgical standpoint.    Lab Results  Component Value Date   PLT 196 08/27/2019    Diet :  Diet Order            Diet renal with fluid restriction Fluid restriction: 1200 mL Fluid; Room service appropriate? No; Fluid consistency: Thin  Diet effective now               Inpatient Medications Scheduled Meds: . amiodarone  200 mg Oral Daily  . aspirin EC  81 mg Oral Daily  . atorvastatin  80 mg Oral Daily  . clopidogrel  75 mg Oral Q breakfast  . collagenase   Topical Daily  . darbepoetin (ARANESP) injection - DIALYSIS  60 mcg Intravenous Q Tue-HD  . docusate sodium  100 mg Oral BID  . doxercalciferol  3 mcg Intravenous Q T,Th,Sa-HD  . feeding supplement (NEPRO CARB STEADY)  237 mL Oral TID BM  . feeding supplement (PRO-STAT SUGAR FREE 64)  30 mL Oral BID  . ferric citrate  420 mg Oral TID WC  . heparin injection (subcutaneous)  5,000 Units Subcutaneous Q8H  . insulin aspart  0-5 Units Subcutaneous QHS  . insulin aspart  0-9 Units Subcutaneous TID WC  . midodrine  5 mg Oral TID WC  . multivitamin  1 tablet Oral QHS  . pantoprazole  40 mg Oral Daily  . QUEtiapine  25 mg Oral QHS   Continuous Infusions: . sodium chloride     PRN Meds:.acetaminophen, albuterol, bisacodyl, haloperidol lactate, hydrALAZINE, labetalol, magnesium citrate, nitroGLYCERIN, ondansetron (ZOFRAN) IV, oxyCODONE, polyethylene glycol  Antibiotics  :   Anti-infectives (From admission, onward)   Start     Dose/Rate Route Frequency Ordered Stop   08/27/19 1200  vancomycin (VANCOCIN) IVPB 1000 mg/200 mL premix  Status:  Discontinued     1,000 mg 200 mL/hr over 60 Minutes Intravenous Every T-Th-Sa (Hemodialysis) 08/24/19 1135 08/26/19 1417   08/27/19 1200  vancomycin (VANCOCIN) IVPB 1000 mg/200 mL premix  Status:  Discontinued     1,000 mg 200 mL/hr over 60 Minutes Intravenous  Every T-Th-Sa (Hemodialysis) 08/26/19 1417  08/27/19 1320   08/25/19 0700  ceFAZolin (ANCEF) IVPB 2g/100 mL premix  Status:  Discontinued     2 g 200 mL/hr over 30 Minutes Intravenous On call to O.R. 08/25/19 0645 08/25/19 0945   08/24/19 1200  vancomycin (VANCOCIN) IVPB 750 mg/150 ml premix  Status:  Discontinued     750 mg 150 mL/hr over 60 Minutes Intravenous Every T-Th-Sa (Hemodialysis) 08/24/19 1135 08/26/19 1417   08/22/19 1824  vancomycin (VANCOCIN) 1-5 GM/200ML-% IVPB    Note to Pharmacy: Ronny Bacon  : cabinet override      08/22/19 1824 08/23/19 0629   08/20/19 1200  vancomycin (VANCOCIN) IVPB 1000 mg/200 mL premix  Status:  Discontinued     1,000 mg 200 mL/hr over 60 Minutes Intravenous Every T-Th-Sa (Hemodialysis) 08/19/19 1501 08/24/19 1135   08/19/19 2000  ceFEPIme (MAXIPIME) 1 g in sodium chloride 0.9 % 100 mL IVPB  Status:  Discontinued     1 g 200 mL/hr over 30 Minutes Intravenous Every 24 hours 08/18/19 2000 08/27/19 1320   08/18/19 2015  ceFEPIme (MAXIPIME) 2 g in sodium chloride 0.9 % 100 mL IVPB     2 g 200 mL/hr over 30 Minutes Intravenous  Once 08/18/19 2000 08/18/19 2139   08/18/19 2015  vancomycin (VANCOREADY) IVPB 500 mg/100 mL     500 mg 100 mL/hr over 60 Minutes Intravenous  Once 08/18/19 2000 08/18/19 2339   08/18/19 2000  vancomycin variable dose per unstable renal function (pharmacist dosing)  Status:  Discontinued      Does not apply See admin instructions 08/18/19 2000 08/19/19 1501          Objective:   Vitals:   08/26/19 1156 08/26/19 2011 08/27/19 0609 08/27/19 1103  BP:  (!) 92/48 (!) 104/54 (!) 109/43  Pulse:  82 85 79  Resp:  19 20 19   Temp:  99.7 F (37.6 C) 99 F (37.2 C) 98.2 F (36.8 C)  TempSrc:  Oral Oral Oral  SpO2: 99% 100% 100% 95%  Weight:      Height:        SpO2: 95 % O2 Flow Rate (L/min): 4 L/min  Wt Readings from Last 3 Encounters:  08/25/19 93.6 kg  07/22/19 104.3 kg  06/11/19 98.4 kg     Intake/Output Summary (Last 24 hours) at 08/27/2019  1336 Last data filed at 08/27/2019 0802 Gross per 24 hour  Intake 534.25 ml  Output 50 ml  Net 484.25 ml     Physical Exam   Awake Alert, No new F.N deficits, Normal affect [place].AT,PERRAL Supple Neck,No JVD, No cervical lymphadenopathy appriciated.  Symmetrical Chest wall movement, Good air movement bilaterally, CTAB RRR,No Gallops, Rubs or new Murmurs, No Parasternal Heave +ve B.Sounds, Abd Soft, No tenderness, No organomegaly appriciated, No rebound - guarding or rigidity. Left AKA stump appears stable with wound VAC, does have right heel ulcers as well.    Data Review:    Recent Labs  Lab 08/21/19 0630 08/21/19 0630 08/24/19 0249 08/25/19 0323 08/25/19 1405 08/26/19 0245 08/27/19 0234  WBC 8.3   < > 9.1 9.1 10.5 11.0* 10.0  HGB 9.6*   < > 9.2* 8.2* 8.5* 8.2* 7.8*  HCT 30.1*   < > 29.2* 25.9* 26.8* 25.6* 24.6*  PLT 224   < > 199 185 201 205 196  MCV 78.2*   < > 78.9* 78.7* 78.6* 78.5* 79.1*  MCH 24.9*   < > 24.9* 24.9* 24.9* 25.2*  25.1*  MCHC 31.9   < > 31.5 31.7 31.7 32.0 31.7  RDW 18.9*   < > 18.9* 18.7* 18.7* 18.8* 18.7*  LYMPHSABS 1.0  --  1.0 1.0  --  0.7 1.4  MONOABS 1.1*  --  1.2* 1.0  --  1.2* 1.6*  EOSABS 0.4  --  0.5 0.5  --  0.0 0.4  BASOSABS 0.1  --  0.1 0.1  --  0.0 0.1   < > = values in this interval not displayed.    Recent Labs  Lab 08/21/19 0630 08/24/19 0249 08/25/19 0323 08/25/19 1405 08/26/19 0245  NA 136 136 137 136 139  K 3.7 3.8 3.5 4.7 4.3  CL 95* 97* 100 98 99  CO2 26 24 24 23 25   GLUCOSE 132* 130* 151* 189* 133*  BUN 28* 25* 32* 36* 24*  CREATININE 6.80* 6.69* 7.92* 8.71* 6.38*  CALCIUM 8.6* 9.0 8.6* 9.0 8.7*  AST 12* 14*  --   --   --   ALT 6 7  --   --   --   ALKPHOS 49 48  --   --   --   BILITOT 0.6 1.0  --   --   --   ALBUMIN 2.8* 2.8*  --  2.4*  --   MG  --  1.9 1.8  --   --   CRP  --   --  11.5*  --  11.5*  INR  --   --  1.3*  --   --     Recent Labs  Lab 08/22/19 0425 08/25/19 0323 08/26/19 0245  CRP  --   11.5* 11.5*  SARSCOV2NAA NEGATIVE  --   --     ------------------------------------------------------------------------------------------------------------------ No results for input(s): CHOL, HDL, LDLCALC, TRIG, CHOLHDL, LDLDIRECT in the last 72 hours.  Lab Results  Component Value Date   HGBA1C 6.2 (H) 08/18/2019   ------------------------------------------------------------------------------------------------------------------ No results for input(s): TSH, T4TOTAL, T3FREE, THYROIDAB in the last 72 hours.  Invalid input(s): FREET3 ------------------------------------------------------------------------------------------------------------------ No results for input(s): VITAMINB12, FOLATE, FERRITIN, TIBC, IRON, RETICCTPCT in the last 72 hours.  Coagulation profile Recent Labs  Lab 08/25/19 0323  INR 1.3*    No results for input(s): DDIMER in the last 72 hours.  Cardiac Enzymes No results for input(s): CKMB, TROPONINI, MYOGLOBIN in the last 168 hours.  Invalid input(s): CK ------------------------------------------------------------------------------------------------------------------    Component Value Date/Time   BNP 1,371.0 (H) 08/13/2017 1147    Micro Results Recent Results (from the past 240 hour(s))  Culture, blood (routine x 2)     Status: None   Collection Time: 08/18/19  8:09 PM   Specimen: BLOOD LEFT HAND  Result Value Ref Range Status   Specimen Description BLOOD LEFT HAND  Final   Special Requests   Final    BOTTLES DRAWN AEROBIC AND ANAEROBIC Blood Culture adequate volume   Culture   Final    NO GROWTH 5 DAYS Performed at [facility], [place] [address], [place], [place] [address]    Report Status 08/23/2019 FINAL  Final  Culture, blood (routine x 2)     Status: None   Collection Time: 08/18/19  8:17 PM   Specimen: BLOOD LEFT HAND  Result Value Ref Range Status   Specimen Description BLOOD LEFT HAND  Final   Special Requests   Final    BOTTLES  DRAWN AEROBIC AND ANAEROBIC Blood Culture adequate volume   Culture   Final    NO GROWTH  5 DAYS Performed at [facility], [place] [address], [place], [place] [address]    Report Status 08/23/2019 FINAL  Final  MRSA PCR Screening     Status: None   Collection Time: 08/19/19  8:23 AM   Specimen: Nasal Mucosa; Nasopharyngeal  Result Value Ref Range Status   MRSA by PCR NEGATIVE NEGATIVE Final    Comment:        The GeneXpert MRSA Assay (FDA approved for NASAL specimens only), is one component of a comprehensive MRSA colonization surveillance program. It is not intended to diagnose MRSA infection nor to guide or monitor treatment for MRSA infections. Performed at [facility], [place] [address], [place], [place] [address]   SARS CORONAVIRUS 2 (TAT 6-24 HRS) Nasopharyngeal Nasopharyngeal Swab     Status: None   Collection Time: 08/22/19  4:25 AM   Specimen: Nasopharyngeal Swab  Result Value Ref Range Status   SARS Coronavirus 2 NEGATIVE NEGATIVE Final    Comment: (NOTE) SARS-CoV-2 target nucleic acids are NOT DETECTED. The SARS-CoV-2 RNA is generally detectable in upper and lower respiratory specimens during the acute phase of infection. Negative results do not preclude SARS-CoV-2 infection, do not rule out co-infections with other pathogens, and should not be used as the sole basis for treatment or other patient management decisions. Negative results must be combined with clinical observations, patient history, and epidemiological information. The expected result is Negative. Fact Sheet for Patients: SugarRoll.be Fact Sheet for Healthcare Providers: https://www.woods-mathews.com/ This test is not yet approved or cleared by the [place] FDA and  has been authorized for detection and/or diagnosis of SARS-CoV-2 by FDA under an Emergency Use Authorization (EUA). This EUA will remain  in effect (meaning this test can be used) for  the duration of the COVID-19 declaration under Section 56 4(b)(1) of the Act, 21 U.S.C. section 360bbb-3(b)(1), unless the authorization is terminated or revoked sooner. Performed at [facility], [place] [address] [place], [place] [address]     Radiology Reports DG Chest 1 View  Result Date: 08/18/2019 CLINICAL DATA:  [place]. EXAM: CHEST  1 VIEW COMPARISON:  May 29, 2019 FINDINGS: There is no evidence of acute infiltrate, pleural effusion or pneumothorax. There is stable marked severity enlargement of the cardiac silhouette. Multiple radiopaque surgical clips are seen along the left axilla. Degenerative changes seen throughout the thoracic spine. IMPRESSION: Stable cardiomegaly without evidence of acute or active cardiopulmonary disease. Electronically Signed   By: Virgina Norfolk M.D.   On: 08/18/2019 21:45   DG Ankle Complete Left  Result Date: 08/19/2019 CLINICAL DATA:  Pain and swelling. Osteomyelitis. EXAM: LEFT ANKLE COMPLETE - 3+ VIEW COMPARISON:  None. FINDINGS: There is no acute displaced fracture or dislocation. There is osteopenia which limits detection of nondisplaced fractures. There may be ulceration overlying the medial malleolus without definite evidence for underlying osteomyelitis. There is a moderate-sized Achilles tendon enthesophyte. There is a small plantar calcaneal spur. Advanced vascular calcifications are noted. IMPRESSION: 1. No acute displaced fracture or dislocation. 2. Possible ulceration overlying the medial malleolus without definite evidence for underlying osteomyelitis. 3. Advanced vascular calcifications. Electronically Signed   By: Constance Holster M.D.   On: 08/19/2019 19:56   DG Ankle Complete Right  Result Date: 08/19/2019 CLINICAL DATA:  Osteomyelitis of the ankle and foot. EXAM: RIGHT ANKLE - COMPLETE 3+ VIEW COMPARISON:  February 01, 2013 FINDINGS: There are advanced degenerative changes of the ankle mortise with surrounding soft tissue  swelling. There is  no acute displaced fracture. No dislocation. Advanced degenerative changes are noted of the midfoot. There is a moderate-sized plantar calcaneal spur. There is an Achilles tendon enthesophyte. Vascular calcifications are noted. There is no radiographic evidence for osteomyelitis. IMPRESSION: 1. No acute osseous abnormality. 2. Advanced degenerative changes are noted of the midfoot and ankle mortise. Electronically Signed   By: Constance Holster M.D.   On: 08/19/2019 19:57   DG Abd 1 View  Result Date: 08/18/2019 CLINICAL DATA:  Vomiting, constipation EXAM: ABDOMEN - 1 VIEW COMPARISON:  05/12/2019 FINDINGS: There is a non obstructive bowel gas pattern. No supine evidence of free air. No organomegaly or suspicious calcification. No acute bony abnormality. Aortoiliac atherosclerosis. IMPRESSION: No acute findings. Electronically Signed   By: Rolm Baptise M.D.   On: 08/18/2019 21:40   MR FOOT RIGHT WO CONTRAST  Result Date: 08/26/2019 CLINICAL DATA:  Right heel ulcer. Prior fifth toe amputation 4 osteomyelitis. EXAM: MRI OF THE RIGHT FOOT WITHOUT CONTRAST TECHNIQUE: Multiplanar, multisequence MR imaging of the foot was performed. No intravenous contrast was administered. Osteomyelitis protocol MRI of the foot was obtained, to include the entire foot and ankle. This protocol uses a large field of view to cover the entire foot and ankle, and is suitable for assessing bony structures for osteomyelitis. Due to the large field of view and imaging plane choice, this protocol is less sensitive for assessing small structures such as ligamentous structures of the foot and ankle, compared to a dedicated forefoot or dedicated hindfoot exam. COMPARISON:  Right foot and ankle x-rays dated August 19, 2019. FINDINGS: Bones/Joint/Cartilage No suspicious marrow signal abnormality. Prior amputation of the fifth toe and metatarsal head. No fracture or dislocation. Advanced degenerative changes of the ankle and  midfoot with midfoot collapse. Small tibiotalar joint effusion. Ligaments Collateral ligaments are intact. Muscles and Tendons Flexor, peroneal and extensor compartment tendons are intact. Achilles tendon is intact with mild distal tendinosis. Complete fatty atrophy of the intrinsic foot muscles. Soft tissue Focal susceptibility artifact within the plantar soft tissues at the level of the proximal fourth metatarsal, corresponding to the thin linear foreign body seen on recent x-ray. Additional small area of susceptibility artifact in the plantar soft tissues at the level of the second intermetatarsal space without evident foreign body seen on recent x-ray. No fluid collection or hematoma. No soft tissue mass. IMPRESSION: 1. No evidence of osteomyelitis. 2. Focal susceptibility artifact within the plantar soft tissues at the level of the proximal fourth metatarsal, corresponding to a thin linear foreign body seen on recent x-ray. 3. 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 Electronically Signed   By: Titus Dubin M.D.   On: 08/26/2019 17:29   PERIPHERAL VASCULAR CATHETERIZATION  Result Date: 08/22/2019 Patient name: TAKEYAH WIEMAN MRN: [id number] DOB: [birth year] Sex: female 08/22/2019 Pre-operative Diagnosis: Bilateral lower extremity ulcer Post-operative diagnosis:  Same Surgeon:  [name] Procedure Performed:  1.  Ultrasound-guided access, right femoral artery  2.  Ultrasound-guided access, left femoral artery  3.  Abdominal aortogram  4.  Bilateral lower extremity runoff  5.  Intravascular lithotripsy, right superficial femoral and popliteal artery  6.  Drug-eluting stent, right superficial femoral-popliteal artery  7.  Conscious sedation, 143 minutes Indications: The patient has extensive bilateral lower extremity ulcers.  She is here today for limb salvage. Procedure:  The patient was identified in the holding area and taken to room 8.  The patient was then placed supine on the table  and prepped and draped  in the usual sterile fashion.  A time out was called.  Conscious sedation was administered with the use of IV fentanyl and Versed under continuous physician and nurse monitoring.  Heart rate, blood pressure, and oxygen saturation were continuously monitored.  Total sedation time was 143 minutes.  I initially tried to get access into the left common femoral artery, however I was unsuccessful therefore I turned my attention towards the right femoral artery.  Ultrasound was used to evaluate the right common femoral artery.  It was patent .  A digital ultrasound image was acquired.  A micropuncture needle was used to access the right common femoral artery under ultrasound guidance.  An 018 wire was advanced without resistance and a micropuncture sheath was placed.  The 018 wire was removed and a benson wire was placed.  The micropuncture sheath was exchanged for a 5 french sheath.  An omniflush catheter was advanced over the wire to the level of L-1.  An abdominal angiogram was obtained.  Next, the cath was pulled out of the aortic bifurcation and bilateral runoff was performed.  I then followed up for the intervention by gaining access under ultrasound guidance of the left common femoral artery. Findings:  Aortogram: No significant infrarenal aortic stenosis.  Bilateral common and external iliac arteries are patent throughout the course however they are heavily calcified.   Right Lower Extremity:  The right common femoral and profundofemoral artery are heavily calcified but patent throughout their course.  The superficial femoral artery is diffusely diseased with multiple high-grade lesions down to the adductor canal where it occludes.  There is reconstitution of the popliteal artery just below the joint space.  There is two-vessel runoff via the peroneal and posterior tibial artery.  Left Lower Extremity: The common femoral and profundofemoral artery are patent without stenosis but heavily  calcified.  The superficial femoral artery is occluded with reconstitution of the superficial femoral artery at the adductor canal with diffusely diseased three-vessel runoff and minimal opacification of the digital arteries. Intervention: After the above images were acquired the decision made to proceed with intervention.  The sheath in the right groin was removed.  I tried to use a minx however this failed because the balloon popped.  Manual pressure was held for 10 minutes.  I then placed a 7 x 45 sheath from the left groin into the right external iliac artery and fully heparinized the patient.  Using a quick cross catheter and 035 Glidewire followed by a Glidewire advantage I was able to get across the stenosis.  The lesion was predilated with a 4 x 100 balloon.  I then performed shockwave intravascular ultrasound of the popliteal artery just distal to the joint space up to its origin.  A 5 x 60 balloon was used for the distal half and a 6 x 60 balloon was used for the proximal half.  Total treatment time was 510 seconds.  I then elected to stent the treated area.  A 5 x 100 Tigris stent was placed distally followed by Elluvia 6 x 120, 7 x 120, and 7 x 40.  The stents were then molded with a 5 mm balloon and completion imaging showed inline flow down across the foot.  Catheters and wires were removed.  A short 7 French sheath was placed.  Patient tolerated the procedure well there were no complications. 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  Successfully recanalized and treated with shockwave intravascular  lithotripsy followed by drug-eluting stent placement from just below the joint space in the popliteal artery up to the origin of the superficial femoral artery.  There is two-vessel runoff via the peroneal and posterior tibial artery.  #2  Left superficial femoral artery occlusion  V. [name], M.D., [facility] [place] Office:  [phone] Pager:  [phone]  DG Foot 2 Views Left  Result Date: 08/19/2019 CLINICAL DATA:  Osteomyelitis of the ankle or foot. EXAM: LEFT FOOT - 2 VIEW COMPARISON:  08/18/2019 FINDINGS: Again noted is a foreign body in the medial soft tissues of the left foot. There are degenerative changes of the interphalangeal joints. Advanced vascular calcifications are noted. There is a moderate-sized plantar calcaneal spur. There is no acute displaced fracture or dislocation. There is no definite radiographic evidence for osteomyelitis. IMPRESSION: 1. No definite radiographic evidence for osteomyelitis. 2. Stable foreign body in the medial soft tissues of the left foot. 3. Degenerative changes of the interphalangeal joints. Electronically Signed   By: Constance Holster M.D.   On: 08/19/2019 19:55   DG Foot Complete Right  Result Date: 08/19/2019 CLINICAL DATA:  Osteomyelitis. 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 There is a lucency at the head of the fifth metatarsal. There is some surrounding soft tissue swelling. Vascular calcifications are noted. There are degenerative changes of the midfoot. IMPRESSION: 1. No acute displaced fracture or dislocation. 2. 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 There is a persistent ill-defined lucency at the head of the fifth metatarsal which can be seen in patients with osteomyelitis. Electronically Signed   By: Constance Holster M.D.   On: 08/19/2019 19:59   VAS [place] ABI WITH/WO TBI  Result Date: 08/21/2019 LOWER EXTREMITY DOPPLER STUDY Indications: Gangrene, and peripheral artery disease. High Risk Factors: Diabetes.  Performing Technologist: June Leap Rvt, Rdms  Examination Guidelines: A complete evaluation includes at minimum, Doppler waveform signals and systolic blood pressure reading at the level of bilateral brachial, anterior  tibial, and posterior tibial arteries, when vessel segments are accessible. Bilateral testing is considered an integral part of a complete examination. Photoelectric Plethysmograph (PPG) waveforms and toe systolic pressure readings are included as required and additional duplex testing as needed. Limited examinations for reoccurring indications may be performed as noted.  ABI Findings: +---------+-----------------+-----+------------------+-------------------------+ Right    Rt Pressure      IndexWaveform          Comment                            (mmHg)                                                            +---------+-----------------+-----+------------------+-------------------------+ Brachial                                         BP not taken-restricted  arm                       +---------+-----------------+-----+------------------+-------------------------+ ATA      206              1.02 dampened                                                                   monophasic                                  +---------+-----------------+-----+------------------+-------------------------+ PTA      147              0.73 dampened                                                                   monophasic                                  +---------+-----------------+-----+------------------+-------------------------+ Great Toe                      Abnormal                                    +---------+-----------------+-----+------------------+-------------------------+ +---------+------------------+-----+-------------------+-------+ Left     Lt Pressure (mmHg)IndexWaveform           Comment +---------+------------------+-----+-------------------+-------+ Brachial 201                    triphasic                  +---------+------------------+-----+-------------------+-------+ ATA      97                 0.48 dampened monophasic        +---------+------------------+-----+-------------------+-------+ PTA      19                0.09 dampened monophasic        +---------+------------------+-----+-------------------+-------+ Great Toe                       Abnormal                   +---------+------------------+-----+-------------------+-------+ Great toe PPG waveform too dampened to obtain pressures bilaterally.  Summary: Right: ABI is within normal range by ATA pressure, however abnormal pedal artery waveforms suggest this is falsely elevated. ABI is in the moderate range based on PTA pressure. Severely dampened PPG waveform of Great toe. Left: Resting left ankle-brachial index indicates severe left lower extremity arterial disease. Severely dampened PPG waveform of Great toe.  *See table(s) above for measurements and observations.  Electronically signed by Servando Snare MD on 08/21/2019 at 5:13:35 PM.    Final     Time Spent in  minutes  30   Lala Lund M.D on 08/27/2019 at 1:36 PM  To page go to [email or web address] - password [facility]

## 2019-08-27 NOTE — Plan of Care (Signed)
  Problem: Nutrition: Goal: Adequate nutrition will be maintained Outcome: Progressing   Problem: Safety: Goal: Ability to remain free from injury will improve Outcome: Progressing   Problem: Skin Integrity: Goal: Risk for impaired skin integrity will decrease Outcome: Progressing   

## 2019-08-27 NOTE — Progress Notes (Signed)
S/p AKA.  Comfortable. Vac working . 75 in [place]. No complaints  Plan: Continue mobilize vac in place

## 2019-08-27 NOTE — Progress Notes (Signed)
[place] for heparin Indication: atrial fibrillation  Allergies  Allergen Reactions  . Olmesartan Other (See Comments)    Hyperkalemia     Patient Measurements: Height: 5\' 6"  (167.6 cm) Weight: 93.6 kg (206 lb 5.6 oz) IBW/kg (Calculated) : 59.3 Heparin Dosing Weight: 82.1  Vital Signs: Temp: 99 F (37.2 C) (04/13 0609) Temp Source: Oral (04/13 0609) BP: 104/54 (04/13 0609) Pulse Rate: 85 (04/13 0609)  Labs: Recent Labs    08/25/19 0323 08/25/19 0323 08/25/19 1405 08/25/19 1405 08/26/19 0103 08/26/19 0245 08/26/19 1054 08/27/19 0234  HGB 8.2*   < > 8.5*   < >  --  8.2*  --  7.8*  HCT 25.9*   < > 26.8*  --   --  25.6*  --  24.6*  PLT 185   < > 201  --   --  205  --  196  APTT  --   --   --   --  90*  --   --   --   LABPROT 16.2*  --   --   --   --   --   --   --   INR 1.3*  --   --   --   --   --   --   --   HEPARINUNFRC  --   --   --   --  0.25*  --  0.34 0.30  CREATININE 7.92*  --  8.71*  --   --  6.38*  --   --    < > = values in this interval not displayed.    Estimated Creatinine Clearance: 9.1 mL/min (A) (by C-G formula based on SCr of 6.38 mg/dL (H)).   Medical History: Past Medical History:  Diagnosis Date  . Arthritis   . Cancer [facility]) 2005    left breast  . CHF (congestive heart failure) ([place])   . Chronic back pain   . Chronic kidney disease 03/2014   dialysis t/th/sa  . Coronary artery disease   . Diabetes mellitus    Type 2  . Diabetic nephropathy ([place]) 01-08-13  . Dyslipidemia 01-08-13  . ESRD on hemodialysis ([place])    Tu, Th, Sat  . Headache   . Hyperlipidemia   . Hypertension   . LBBB (left bundle branch block)   . Proteinuria 01-08-13  . Pulmonary hypertension ([place])    PA peak pressure 73 mmHg 08/05/17 echo  . Thyroid disease 01-08-13   Hyper-parathyroidism-secondary  . Wears glasses     Medications:  Scheduled:  . amiodarone  200 mg Oral Daily  . aspirin EC  81 mg Oral Daily  .  atorvastatin  80 mg Oral Daily  . clopidogrel  75 mg Oral Q breakfast  . collagenase   Topical Daily  . darbepoetin (ARANESP) injection - DIALYSIS  60 mcg Intravenous Q Tue-HD  . docusate sodium  100 mg Oral BID  . doxercalciferol  3 mcg Intravenous Q T,Th,Sa-HD  . feeding supplement (NEPRO CARB STEADY)  237 mL Oral TID BM  . feeding supplement (PRO-STAT SUGAR FREE 64)  30 mL Oral BID  . ferric citrate  420 mg Oral TID WC  . insulin aspart  0-5 Units Subcutaneous QHS  . insulin aspart  0-9 Units Subcutaneous TID WC  . midodrine  5 mg Oral TID WC  . multivitamin  1 tablet Oral QHS  . QUEtiapine  25 mg Oral QHS   Infusions:  .  sodium chloride    . ceFEPime (MAXIPIME) IV 1 g (08/26/19 2233)  . heparin Stopped (08/26/19 1534)  . 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 Eliquis held and IV heparin started post-operatively.  Heparin level therapeutic at 0.30, Hgb down slightly, pltc stable.  Goal of Therapy:  Heparin level 0.3-0.7 units/ml Monitor platelets by anticoagulation protocol: Yes   Plan:  -Increase heparin to 1100 units/h -Daily heparin level and CBC -Can likely resume Eliquis today   Arrie Senate, PharmD, BCPS Clinical Pharmacist [phone] Please check AMION for all [place] numbers 08/27/2019

## 2019-08-27 NOTE — Plan of Care (Signed)
  Problem: Clinical Measurements: Goal: Respiratory complications will improve Outcome: Progressing   Problem: Health Behavior/Discharge Planning: Goal: Ability to manage health-related needs will improve Outcome: Not Progressing   

## 2019-08-28 DIAGNOSIS — N186 End stage renal disease: Secondary | ICD-10-CM | POA: Diagnosis not present

## 2019-08-28 DIAGNOSIS — E11621 Type 2 diabetes mellitus with foot ulcer: Secondary | ICD-10-CM | POA: Diagnosis not present

## 2019-08-28 DIAGNOSIS — M86172 Other acute osteomyelitis, left ankle and foot: Secondary | ICD-10-CM | POA: Diagnosis not present

## 2019-08-28 DIAGNOSIS — M869 Osteomyelitis, unspecified: Secondary | ICD-10-CM | POA: Diagnosis not present

## 2019-08-28 LAB — CBC WITH DIFFERENTIAL/PLATELET
Abs Immature Granulocytes: 0.06 10*3/uL (ref 0.00–0.07)
Basophils Absolute: 0.1 10*3/uL (ref 0.0–0.1)
Basophils Relative: 1 %
Eosinophils Absolute: 0.4 10*3/uL (ref 0.0–0.5)
Eosinophils Relative: 4 %
HCT: 24.5 % — ABNORMAL LOW (ref 36.0–46.0)
Hemoglobin: 7.9 g/dL — ABNORMAL LOW (ref 12.0–15.0)
Immature Granulocytes: 1 %
Lymphocytes Relative: 14 %
Lymphs Abs: 1.3 10*3/uL (ref 0.7–4.0)
MCH: 25.7 pg — ABNORMAL LOW (ref 26.0–34.0)
MCHC: 32.2 g/dL (ref 30.0–36.0)
MCV: 79.8 fL — ABNORMAL LOW (ref 80.0–100.0)
Monocytes Absolute: 1.2 10*3/uL — ABNORMAL HIGH (ref 0.1–1.0)
Monocytes Relative: 14 %
Neutro Abs: 5.9 10*3/uL (ref 1.7–7.7)
Neutrophils Relative %: 66 %
Platelets: 190 10*3/uL (ref 150–400)
RBC: 3.07 MIL/uL — ABNORMAL LOW (ref 3.87–5.11)
RDW: 19 % — ABNORMAL HIGH (ref 11.5–15.5)
WBC: 8.9 10*3/uL (ref 4.0–10.5)
nRBC: 0.2 % (ref 0.0–0.2)

## 2019-08-28 LAB — SURGICAL PATHOLOGY

## 2019-08-28 LAB — GLUCOSE, CAPILLARY
Glucose-Capillary: 132 mg/dL — ABNORMAL HIGH (ref 70–99)
Glucose-Capillary: 131 mg/dL — ABNORMAL HIGH (ref 70–99)
Glucose-Capillary: 131 mg/dL — ABNORMAL HIGH (ref 70–99)
Glucose-Capillary: 146 mg/dL — ABNORMAL HIGH (ref 70–99)

## 2019-08-28 MED ORDER — QUETIAPINE FUMARATE 25 MG PO TABS
12.5000 mg | ORAL_TABLET | Freq: Every day | ORAL | Status: DC
  Administered 2019-08-28 – 2019-08-30 (×3): 12.5 mg via ORAL
  Filled 2019-08-28 (×3): qty 1

## 2019-08-28 MED ORDER — MIDODRINE HCL 5 MG PO TABS
10.0000 mg | ORAL_TABLET | Freq: Three times a day (TID) | ORAL | Status: DC
  Administered 2019-08-28 – 2019-08-31 (×6): 10 mg via ORAL
  Filled 2019-08-28 (×6): qty 2

## 2019-08-28 MED ORDER — DOXYCYCLINE HYCLATE 100 MG PO TABS
100.0000 mg | ORAL_TABLET | Freq: Two times a day (BID) | ORAL | Status: DC
  Administered 2019-08-29 – 2019-08-31 (×5): 100 mg via ORAL
  Filled 2019-08-28 (×5): qty 1

## 2019-08-28 NOTE — Progress Notes (Signed)
Patient refused all P.O. meds

## 2019-08-28 NOTE — NC FL2 (Signed)
[place] MEDICAID FL2 LEVEL OF CARE SCREENING TOOL     IDENTIFICATION  Patient Name: Alexis [name] Birthdate: 1946/04/28 Sex: female Admission Date (Current Location): 08/18/2019  [facility] and [place] Number:  [employer] and Address:  The . [facility], [place] [address] [place], [place] [address]      Provider Number: [id number]  Attending Physician Name and Address:  Thurnell Lose, MD  Relative Name and Phone Number:       Current Level of Care: Hospital Recommended Level of Care: [place] Prior Approval Number:    Date Approved/Denied:   PASRR Number: [id number]  Discharge Plan: SNF    Current Diagnoses: Patient Active Problem List   Diagnosis Date Noted  . Gangrene of left foot ([place])   . 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 PVD (peripheral vascular disease) ([place])   . Osteomyelitis ([place]) 08/18/2019  . Diabetic foot ulcers ([place]) 08/18/2019  . Emesis 08/18/2019  . Altered mental status 06/11/2019  . Generalized weakness 06/11/2019  . Acute delirium 06/11/2019  . Hallucinations 06/11/2019  . Acute CVA (cerebrovascular accident) ([place]) 06/11/2019  . Hypokalemia 05/12/2019  . Hyperlipidemia   . Pulmonary hypertension ([place])   . GERD (gastroesophageal reflux disease)   . Stable angina (H[facility])   . Atrial fibrillation with rapid ventricular response ([place]) 05/09/2019  . Elevated troponin   . ESRD on hemodialysis ([place])   . Dyspnea 08/13/2017  . Chest pain 08/04/2017  . 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 Essential hypertension 08/15/2012  . CAD (coronary artery disease) 03/25/2011  . Type II diabetes mellitus with manifestations ([place]) 03/25/2011  . Chest pain 03/25/2011  . CHF (congestive heart failure) ([place]) 10/14/2010  . Anemia in chronic kidney disease (CKD) 10/14/2010  . Obesity 10/14/2010    Orientation RESPIRATION BLADDER Height & Weight      Self  Normal Incontinent Weight: 193 lb 12.6 oz (87.9 kg)(removed wound vac from end of bed) Height:  5\' 6"  (167.6 cm)  BEHAVIORAL SYMPTOMS/MOOD NEUROLOGICAL BOWEL NUTRITION STATUS      Incontinent Diet(please see discharge summary)  AMBULATORY STATUS COMMUNICATION OF NEEDS Skin     Verbally Surgical wounds(pressure injury buttocks,left Stage II, foam lift dressing/PRN, closed incision left leg, wound incision left foot, would incison right foot, foam lift dressing/PRN, neagtive pressure wound therapy thigh/anterior;left, wound incision left groin,)                       Personal Care Assistance Level of Assistance  Bathing, Feeding, Dressing Bathing Assistance: Maximum assistance Feeding assistance: Independent Dressing Assistance: Maximum assistance     Functional Limitations Info  Sight, Hearing, Speech Sight Info: Adequate Hearing Info: Adequate Speech Info: Adequate    SPECIAL CARE FACTORS FREQUENCY  PT (By licensed PT), OT (By licensed OT)     PT Frequency: 3x per week OT Frequency: 3x per week            Contractures Contractures Info: Not present    Additional Factors Info  Code Status, Allergies Code Status Info: FULL Allergies Info: Olmesartan           Current Medications (08/28/2019):  This is the current hospital active medication list Current Facility-Administered Medications  Medication Dose Route Frequency Provider Last Rate Last Admin  . 0.9 %  sodium chloride infusion   Intravenous Continuous Newt Minion, MD      .  acetaminophen (TYLENOL) tablet 325-650 mg  325-650 mg Oral Q6H PRN Newt Minion, MD      . albuterol (PROVENTIL) (2.5 MG/3ML) 0.083% nebulizer solution 3 mL  3 mL Inhalation Q6H PRN Newt Minion, MD      . amiodarone (PACERONE) tablet 200 mg  200 mg Oral Daily Newt Minion, MD   200 mg at 08/28/19 0807  . aspirin EC tablet 81 mg  81 mg Oral Daily Newt Minion, MD   81 mg at 08/28/19 4315  . atorvastatin (LIPITOR) tablet  80 mg  80 mg Oral Daily Newt Minion, MD   80 mg at 08/28/19 0806  . bisacodyl (DULCOLAX) suppository 10 mg  10 mg Rectal Daily PRN Newt Minion, MD      . clopidogrel (PLAVIX) tablet 75 mg  75 mg Oral Q breakfast Newt Minion, MD   75 mg at 08/28/19 0806  . collagenase (SANTYL) ointment   Topical Daily Newt Minion, MD   Given at 08/28/19 [phone]  . Darbepoetin Alfa (ARANESP) injection 60 mcg  60 mcg Intravenous Q Tue-HD Newt Minion, MD   60 mcg at 08/27/19 1554  . docusate sodium (COLACE) capsule 100 mg  100 mg Oral BID Newt Minion, MD   100 mg at 08/27/19 0956  . doxercalciferol (HECTOROL) injection 3 mcg  3 mcg Intravenous Q T,Th,Sa-HD Newt Minion, MD   3 mcg at 08/27/19 1552  . [START ON 08/29/2019] doxycycline (VIBRA-TABS) tablet 100 mg  100 mg Oral Q12H Thurnell Lose, MD      . feeding supplement (NEPRO CARB STEADY) liquid 237 mL  237 mL Oral TID BM Thurnell Lose, MD      . feeding supplement (PRO-STAT SUGAR FREE 64) liquid 30 mL  30 mL Oral BID Thurnell Lose, MD      . ferric citrate (AURYXIA) tablet 420 mg  420 mg Oral TID WC Newt Minion, MD   420 mg at 08/28/19 1639  . haloperidol lactate (HALDOL) injection 2 mg  2 mg Intravenous Q6H PRN Newt Minion, MD   2 mg at 08/27/19 1928  . heparin injection 5,000 Units  5,000 Units Subcutaneous Q8H Thurnell Lose, MD   5,000 Units at 08/28/19 1316  . insulin aspart (novoLOG) injection 0-5 Units  0-5 Units Subcutaneous QHS Newt Minion, MD   2 Units at 08/26/19 2241  . insulin aspart (novoLOG) injection 0-9 Units  0-9 Units Subcutaneous TID WC Newt Minion, MD   1 Units at 08/28/19 1640  . labetalol (NORMODYNE) injection 10 mg  10 mg Intravenous Q10 min PRN Newt Minion, MD      . magnesium citrate solution 1 Bottle  1 Bottle Oral Once PRN Newt Minion, MD      . midodrine (PROAMATINE) tablet 10 mg  10 mg Oral TID WC Thurnell Lose, MD   10 mg at 08/28/19 1639  . multivitamin (RENA-VIT) tablet 1 tablet  1  tablet Oral QHS Newt Minion, MD   1 tablet at 08/26/19 2233  . nitroGLYCERIN (NITROSTAT) SL tablet 0.4 mg  0.4 mg Sublingual Q5 Min x 3 PRN Newt Minion, MD      . ondansetron [facility]) injection 4 mg  4 mg Intravenous Q6H PRN Newt Minion, MD      . oxyCODONE (Oxy IR/ROXICODONE) immediate release tablet 5 mg  5 mg Oral Q6H PRN Thurnell Lose, MD      .  pantoprazole (PROTONIX) EC tablet 40 mg  40 mg Oral Daily Thurnell Lose, MD   40 mg at 08/28/19 0806  . polyethylene glycol (MIRALAX / GLYCOLAX) packet 17 g  17 g Oral Daily PRN Newt Minion, MD   17 g at 08/23/19 0804  . QUEtiapine (SEROQUEL) tablet 12.5 mg  12.5 mg Oral QHS Thurnell Lose, MD         Discharge Medications: Please see discharge summary for a list of discharge medications.  Relevant Imaging Results:  Relevant Lab Results:   Additional Information SSN [id number]  willl need dialysis-  Vinie Sill, LCSWA

## 2019-08-28 NOTE — Progress Notes (Signed)
Pharmacy Antibiotic Note  Alexis [name] is a 74 y.o. female admitted on 08/18/2019 with DFI and osteomyelitis. Pharmacy has been consulted for vancomycin and cefepime dosing. Pt now s/p L AKA, antibiotics continued with concern for fibula involvement. Pt has ESRD on HD TTS, last 4/13.    Plan: -Adjust cefepime to 2g qHD -Vancomycin 1000mg  IV qHD Tues/Thurs/Sat -F/U ortho plans, LOT, HD schedule    Height: 5\' 6"  (167.6 cm) Weight: 87.9 kg (193 lb 12.6 oz)(removed wound vac from end of bed) IBW/kg (Calculated) : 59.3  Temp (24hrs), Avg:98.7 F (37.1 C), Min:98.2 F (36.8 C), Max:99.2 F (37.3 C)  Recent Labs  Lab 08/24/19 0249 08/24/19 0249 08/25/19 0323 08/25/19 1405 08/26/19 0245 08/26/19 1054 08/27/19 0234 08/27/19 1400 08/28/19 0250  WBC 9.1   < > 9.1 10.5 11.0*  --  10.0  --  8.9  CREATININE 6.69*  --  7.92* 8.71* 6.38*  --   --  8.69*  --   VANCORANDOM  --   --  18  --   --  17  --   --   --    < > = values in this interval not displayed.    Estimated Creatinine Clearance: 6.4 mL/min (A) (by C-G formula based on SCr of 8.69 mg/dL (H)).    Allergies  Allergen Reactions  . Olmesartan Other (See Comments)    Hyperkalemia    Antimicrobials: Vancomycin 4/5>> Cefepime 4/5>>  Microbiology: 4/4: BCx: negative 4/5: MRSA PCR: negative   Arrie Senate, PharmD, BCPS Clinical Pharmacist [phone] Please check AMION for all [facility] Pharmacy numbers 08/28/2019

## 2019-08-28 NOTE — TOC Initial Note (Signed)
Transition of Care [facility]) - Initial/Assessment Note    Patient Details  Name: Alexis [name] MRN: [id number] Date of Birth: Oct 17, 1945  [facility] CM/SW Contact:    Vinie Sill, [place] Phone Number: 08/28/2019, 5:09 PM  Clinical Narrative:                   CSW spoke with the patient's spouse,[name]. CSW introduced self and explained role. CSW discussed PT recommendation of ST rehab at [facility]. Patient's spouse states he agrees with ST rehab at [facility] before discharged home. He states no SNF preference. CSW explained the SNF process and possible limited SNF options since patient needs dialysis. He states he understands but requested no SNF in the [place] area. CSW was given to send SNF referrals. Patient's spouse states no questions or concerns at this time.   CSW sent out referrals CSW started insurance authorization CSW will provide bed offers once provided. CSW will continue to follow and assist with discharge planning.  Thurmond Butts, MSW, [place] Clinical Social Worker    Expected Discharge Plan: Skilled Nursing Facility Barriers to Discharge: Ship broker, Continued Medical Work up, SNF Pending bed offer(patient will need outpatient HD clinic setup w/SNF)   Patient Goals and CMS Choice        Expected Discharge Plan and Services Expected Discharge Plan: [place] In-house Referral: Clinical Social Work                                            Prior Living Arrangements/Services   Lives with:: Self, Spouse Patient language and need for interpreter reviewed:: No                 Activities of Daily Living Home Assistive Devices/Equipment: Environmental consultant (specify type) ADL Screening (condition at time of admission) Patient's cognitive ability adequate to safely complete daily activities?: Yes Is the patient deaf or have difficulty hearing?: No Does the patient have difficulty seeing, even when wearing glasses/contacts?:  No Does the patient have difficulty concentrating, remembering, or making decisions?: No Patient able to express need for assistance with ADLs?: Yes Does the patient have difficulty dressing or bathing?: No Independently performs ADLs?: Yes (appropriate for developmental age) Does the patient have difficulty walking or climbing stairs?: Yes Weakness of Legs: Both Weakness of Arms/Hands: None  Permission Sought/Granted Permission sought to share information with : Family Supports Permission granted to share information with : Yes, Verbal Permission Granted  Share Information with NAME: Marlene Bast  Permission granted to share info w AGENCY: SNFs  Permission granted to share info w Relationship: spouse  Permission granted to share info w Contact Information: [phone]  Emotional Assessment   Attitude/Demeanor/Rapport: Unable to Assess   Orientation: : Oriented to Self Alcohol / Substance Use: Not Applicable Psych Involvement: No (comment)  Admission diagnosis:  Osteomyelitis ([place]) [M86.9] Patient Active Problem List   Diagnosis Date Noted  . Gangrene of left foot ([place])   . 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 PVD (peripheral vascular disease) ([place])   . Osteomyelitis ([place]) 08/18/2019  . Diabetic foot ulcers ([place]) 08/18/2019  . Emesis 08/18/2019  . Altered mental status 06/11/2019  . Generalized weakness 06/11/2019  . Acute delirium 06/11/2019  . Hallucinations 06/11/2019  . Acute CVA (cerebrovascular accident) ([place]) 06/11/2019  . Hypokalemia 05/12/2019  . Hyperlipidemia   .  Pulmonary hypertension ([place])   . GERD (gastroesophageal reflux disease)   . Stable angina (HCC)   . Atrial fibrillation with rapid ventricular response ([place]) 05/09/2019  . Elevated troponin   . ESRD on hemodialysis ([place])   . Dyspnea 08/13/2017  . Chest pain 08/04/2017  . 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 Essential hypertension  08/15/2012  . CAD (coronary artery disease) 03/25/2011  . Type II diabetes mellitus with manifestations ([place]) 03/25/2011  . Chest pain 03/25/2011  . CHF (congestive heart failure) ([place]) 10/14/2010  . Anemia in chronic kidney disease (CKD) 10/14/2010  . Obesity 10/14/2010   PCP:  System, Provider Not In Pharmacy:   [facility] [address] [place] [address] Phone: [phone] Fax: [phone]     Social Determinants of Health (SDOH) Interventions    Readmission Risk Interventions Readmission Risk Prevention Plan 06/12/2019  Transportation Screening Complete  PCP or Specialist Appt within 3-5 Days Not Complete  HRI or [place] Complete  Social Work Consult for [place] Planning/Counseling Complete  Palliative Care Screening Not Complete  Medication Review Press photographer) Complete  Some recent data might be hidden

## 2019-08-28 NOTE — Progress Notes (Signed)
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 PMH includes: afib on eliquis, ESRD on HD TTS, CAD pulmonary HTN, HTN, HLD, CHF DM II with neuropathy, and CVA.    PT Comments    Pt in bed on arrival, agreeable to participation in therapy. Pt continues to present with confusion. She required +2 max/total assist bed mobility. Pt sat EOB x 1 minute with max assist to maintain balance. Pt required return to supine due to symptomatic orthostatic hypotension. BP in supine prior to mobility, 147/71. Unable to obtain BP sitting due to immediate need for return to supine. Upon return to bed, 2 consecutive BPs taken, 115/38 and 108/47.    Follow Up Recommendations  SNF;Supervision/Assistance - 24 hour     Equipment Recommendations  Other (comment)(defer to next venue)    Recommendations for Other Services       Precautions / Restrictions Precautions Precautions: Fall;Other (comment) Precaution Comments: L AKA with wound vac Restrictions Weight Bearing Restrictions: Yes LLE Weight Bearing: Non weight bearing Other Position/Activity Restrictions: PRAFO RLE    Mobility  Bed Mobility Overal bed mobility: Needs Assistance Bed Mobility: Supine to Sit;Sit to Supine     Supine to sit: +2 for physical assistance;Max assist Sit to supine: +2 for physical assistance;Total assist   General bed mobility comments: decreased initiation. Helicopter method using bed pads for pivot to EOB. Max assist to elevate trunk.  Transfers                 General transfer comment: unable to progress beyond EOB  Ambulation/Gait                 Stairs             Wheelchair Mobility    Modified Rankin (Stroke Patients Only)       Balance Overall balance assessment:  Needs assistance Sitting-balance support: Bilateral upper extremity supported;Feet supported Sitting balance-Leahy Scale: Poor Sitting balance - Comments: Mod/max assist to maintain sitting balance x 1 minute. Pt required return to supine due to glassy-eyed/dizzy. Postural control: Posterior lean                                  Cognition Arousal/Alertness: Awake/alert Behavior During Therapy: WFL for tasks assessed/performed Overall Cognitive Status: Impaired/Different from baseline Area of Impairment: Orientation;Attention;Memory;Following commands;Safety/judgement;Awareness;Problem solving                 Orientation Level: Disoriented to;Place;Time;Situation Current Attention Level: Focused Memory: Decreased short-term memory Following Commands: Follows one step commands inconsistently;Follows one step commands with increased time Safety/Judgement: Decreased awareness of safety Awareness: Emergent Problem Solving: Slow processing;Decreased initiation;Difficulty sequencing;Requires verbal cues        Exercises      General Comments General comments (skin integrity, edema, etc.): Pt become dizzy upon sitting EOB. Glassy-eyed with distant stare. Immediately returned to supine. Prior to mobility, BP 147/71 in supine. After returned to supine following EOB sitting, 2 consecutive BPs taken, 115/38 and 108/47.      Pertinent Vitals/Pain Pain Assessment: Faces Faces Pain Scale: Hurts even more Pain Location: LLE Pain Descriptors / Indicators: Grimacing;Guarding Pain Intervention(s): Limited activity within patient's tolerance;Monitored during session;Repositioned    Home Living  Prior Function            PT Goals (current goals can now be found in the care plan section) Acute Rehab PT Goals Patient Stated Goal: not stated Progress towards PT goals: Not progressing toward goals - comment(orthostatic)    Frequency    Min  2X/week      PT Plan Current plan remains appropriate    Co-evaluation              AM-PAC PT "6 Clicks" Mobility   Outcome Measure  Help needed turning from your back to your side while in a flat bed without using bedrails?: A Lot Help needed moving from lying on your back to sitting on the side of a flat bed without using bedrails?: A Lot Help needed moving to and from a bed to a chair (including a wheelchair)?: Total Help needed standing up from a chair using your arms (e.g., wheelchair or bedside chair)?: Total Help needed to walk in hospital room?: Total Help needed climbing 3-5 steps with a railing? : Total 6 Click Score: 8    End of Session   Activity Tolerance: Treatment limited secondary to medical complications (Comment)(orthostatic hypotensioin) Patient left: in bed;with call bell/phone within reach;with bed alarm set Nurse Communication: Mobility status;Need for lift equipment PT Visit Diagnosis: Difficulty in walking, not elsewhere classified (R26.2);Muscle weakness (generalized) (M62.81);Pain Pain - Right/Left: Left Pain - part of body: Leg     Time: 2585-2778 PT Time Calculation (min) (ACUTE ONLY): 15 min  Charges:  $Therapeutic Activity: 8-22 mins                     Lorrin Goodell, PT  Office # [phone] Pager [phone]    Lorriane Shire 08/28/2019, 9:25 AM

## 2019-08-28 NOTE — Progress Notes (Signed)
Patient ID: Alexis [name], female   DOB: October 22, 1945, 74 y.o.   MRN: [id number]  [facility] Progress Note   Assessment/ Plan:   1.  Status post left above-knee amputation (POD #2)  for osteomyelitis/peripheral vascular disease and bilateral leg ulcers in diabetic patient.  Underwent angiography/stenting for revascularization of right lower extremity on 08/22/19 by vascular surgery. 2. ESRD: I will order for hemodialysis tomorrow to continue her usual outpatient TTS dialysis schedule.  Volume status acceptable. 3. Anemia: Postoperative hemoglobin/hematocrit lower with continued bleeding from wound VAC site on dual antiplatelet therapy status post stenting.  Will give PRBC transfusion if she remains symptomatic with dizziness at dialysis tomorrow. 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  Continue Hectorol for PTH suppression and Auryxia for phosphorus binding. 5. Nutrition: Continue renal diet with protein supplementation. 6.  Insulin-dependent diabetes mellitus: Appears to be well controlled at this time on SSI.  Subjective:   Reports that she is struggling to eat the food here in the hospital.  Noted to be hypotensive earlier with attempted repositioning.   Objective:   BP (!) 147/71 (BP Location: Left Arm)   Pulse 100   Temp 99.5 F (37.5 C) (Axillary)   Resp 18   Ht 5\' 6"  (1.676 m)   Wt 87.9 kg Comment: removed wound vac from end of bed  SpO2 97%   BMI 31.28 kg/m   Physical Exam: Gen: Comfortably resting in bed, awake and alert with limited conversation CVS: Pulse regular rhythm, normal rate, S1 and S2 normal Resp: Clear to auscultation, no rales/rhonchi Abd: Soft, obese, nontender Ext: Status post left above-knee amputation with wound VAC.  Right heel ulcers noted.  Right forearm AVG with intact dressings.  Labs: BMET Recent Labs  Lab 08/24/19 0249 08/25/19 0323 08/25/19 1405 08/26/19 0245 08/27/19 1400  NA 136 137 136 139 135  K 3.8  3.5 4.7 4.3 4.2  CL 97* 100 98 99 96*  CO2 24 24 23 25 24   GLUCOSE 130* 151* 189* 133* 133*  BUN 25* 32* 36* 24* 42*  CREATININE 6.69* 7.92* 8.71* 6.38* 8.69*  CALCIUM 9.0 8.6* 9.0 8.7* 8.5*  PHOS 3.9  --  5.5*  --  4.7*   CBC Recent Labs  Lab 08/25/19 0323 08/25/19 0323 08/25/19 1405 08/26/19 0245 08/27/19 0234 08/28/19 0250  WBC 9.1   < > 10.5 11.0* 10.0 8.9  NEUTROABS 6.5  --   --  8.9* 6.5 5.9  HGB 8.2*   < > 8.5* 8.2* 7.8* 7.9*  HCT 25.9*   < > 26.8* 25.6* 24.6* 24.5*  MCV 78.7*   < > 78.6* 78.5* 79.1* 79.8*  PLT 185   < > 201 205 196 190   < > = values in this interval not displayed.     Medications:    . amiodarone  200 mg Oral Daily  . aspirin EC  81 mg Oral Daily  . atorvastatin  80 mg Oral Daily  . clopidogrel  75 mg Oral Q breakfast  . collagenase   Topical Daily  . darbepoetin (ARANESP) injection - DIALYSIS  60 mcg Intravenous Q Tue-HD  . docusate sodium  100 mg Oral BID  . doxercalciferol  3 mcg Intravenous Q T,Th,Sa-HD  . feeding supplement (NEPRO CARB STEADY)  237 mL Oral TID BM  . feeding supplement (PRO-STAT SUGAR FREE 64)  30 mL Oral BID  . ferric citrate  420 mg Oral TID WC  . heparin injection (subcutaneous)  5,000 Units Subcutaneous Q8H  . insulin aspart  0-5 Units Subcutaneous QHS  . insulin aspart  0-9 Units Subcutaneous TID WC  . midodrine  5 mg Oral TID WC  . multivitamin  1 tablet Oral QHS  . pantoprazole  40 mg Oral Daily  . QUEtiapine  25 mg Oral QHS   Elmarie Shiley, MD 08/28/2019, 9:25 AM

## 2019-08-28 NOTE — Progress Notes (Signed)
Patient ID: Alexis [name], female   DOB: [birth year], 74 y.o.   MRN: [id number] Patient is seen for 2 separate issues,   #1 left transtibial amputation.  Examination there is 150 cc in the wound VAC canister, patient denies any symptoms in the left transtibial amputation.   #2 examination of the right ankle the ischemic ulcer shows excellent improvement since her revascularization.  The ulcer over the distal fibula is approximately 10 mm in diameter 1 mm deep and now has healthy granulation tissue approximately 75%, previously the entire wound was ischemic.  This is showing excellent improvement.  There does show some edema superficially in the bone of the distal fibula on the MRI scan but clinically at this time patient does not have any active infection.  I feel patient could be discharged on doxycycline 100 mg daily for 3 weeks.  No surgical intervention indicated at this time.  Patient will continue dressing changes to the right ankle at discharge.

## 2019-08-28 NOTE — Progress Notes (Addendum)
PROGRESS NOTE                                                                                                                                                                                                             Patient Demographics:    Alexis [name], is a 74 y.o. female, DOB - [birth year], QIH:[id number]  Admit date - 08/18/2019   Admitting Physician Shela Leff, MD  Outpatient Primary MD for the patient is System, Provider Not In  LOS - 10  No chief complaint on file.      Brief Narrative  [place] a 74 y.o.femalewith medical history significant ofA. fib on Eliquis, ESRD on HD TTS, CAD, pulmonary hypertension, LBBB, hypertension, hyperlipidemia, CHF, type 2 diabetes, diabetic neuropathy, [facility] with complaints of bilateral heel ulcers. Diagnosed with osteomyelitis of the right fifth metatarsal head and transferred to [facility] for further management as she is a dialysis patient.   Subjective:   Patient in bed working with PT, states she feels weak, denies any headache or chest pain, no shortness of breath.   Assessment  & Plan :     Diabetic foot ulcers/concern for osteomyelitis: Also neuropathic and vascular ulcers. 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 There was question of distal fibula having signal suggestive of possible infection noted on previous imaging, she has underwent revascularization of the R Leg this admission on 08/22/2019 by vascular surgery and received a drug-eluting stent and currently on dual antiplatelet therapy, right heel ulcer reviewed by Dr. Sharol Given who thinks that there is minimal to no active infection in the right foot and leg and that patient now should be treated with 3 weeks of oral doxycycline which will be started on 08/28/2019.    Continued oozing of blood from the left AKA stump site into the wound VAC, closely monitor, low  threshold to transfuse, consent obtained and type crossmatch done.  Right leg drug-eluting stent placed this admission by vascular surgery.  Case discussed by me with Dr. Trula Slade on 08/27/2019, for now continue dual antiplatelet therapy along with anticoagulation as needed and when able to.   Paroxysmal atrial fibrillation: [place] vas 2 score of at least 3.  Continue amiodarone, tolerated heparin well postop we will switch to Eliquis on 08/27/2019.  Anemia of chronic disease.  Hemoglobin stable.  Some oozing in the wound VAC of the left AKA stump, hold heparin for another 24 hours, currently  she is on dual antiplatelet therapy for the new DES stent in the right leg.  Will monitor H&H and address.  Type screen done May require a unit of packed RBC with next dialysis have already gotten consent from the husband for it if needed.  Hypotension.  Chronic.  Increased midodrine dose on 08/28/2019 will monitor.    ESRD on HD: Receiving dialysis on schedule as per nephrology. TTS.  Dementia with hospital-acquired delirium due to unfamiliar setting along with toxic encephalopathy due to infection.  Supportive care, minimize narcotics and benzodiazepines, family made aware that her delirium might get worse, as needed Haldol and nighttime Seroquel.    Insulin-dependent type 2 diabetes: Takes 70/30 insulin at home.  Blood sugars well controlled currently on sliding scale insulin.  Lab Results  Component Value Date   HGBA1C 6.2 (H) 08/18/2019   CBG (last 3)  Recent Labs    08/27/19 1815 08/27/19 2109 08/28/19 0607  GLUCAP 100* 104* 132*      Family Communication  :  Daughter Alexis [name] [phone], 08/24/19, Husband Alexis [name] on [phone], 08/24/19.  Husband bedside on 08/25/2019, 08/27/2019  Code Status :  Full  Disposition Plan  : In the hospital for treatment of gangrene, s/p left AKA on 08/25/2019, now having some postop bleeding from the left AKA stump site, his H&H remained stable and she  is hemodynamically stable likely discharge to SNF most likely on 08/30/2019.  Consults  :  Ortho, Renal  Procedures  :    MRI R foot - Non acute  Left AKA on 08/25/2019 by Dr. Sharol Given.    R Leg - revascularization 08/22/2019 - " Intravascular lithotripsy, right superficial femoral and popliteal artery and Drug-eluting stent, right superficial femoral-popliteal artery" Continue to hold anticoagulation for anticipated surgical procedure.  Further management per orthopedics if planning any debridements.   DVT Prophylaxis  :  Heparin gtt Eliquis once stable from surgical standpoint.    Lab Results  Component Value Date   PLT 190 08/28/2019    Diet :  Diet Order            Diet renal with fluid restriction Fluid restriction: 1200 mL Fluid; Room service appropriate? No; Fluid consistency: Thin  Diet effective now               Inpatient Medications Scheduled Meds: . amiodarone  200 mg Oral Daily  . aspirin EC  81 mg Oral Daily  . atorvastatin  80 mg Oral Daily  . clopidogrel  75 mg Oral Q breakfast  . collagenase   Topical Daily  . darbepoetin (ARANESP) injection - DIALYSIS  60 mcg Intravenous Q Tue-HD  . docusate sodium  100 mg Oral BID  . doxercalciferol  3 mcg Intravenous Q T,Th,Sa-HD  . [START ON 08/29/2019] doxycycline  100 mg Oral Q12H  . feeding supplement (NEPRO CARB STEADY)  237 mL Oral TID BM  . feeding supplement (PRO-STAT SUGAR FREE 64)  30 mL Oral BID  . ferric citrate  420 mg Oral TID WC  . heparin injection (subcutaneous)  5,000 Units Subcutaneous Q8H  . insulin aspart  0-5 Units Subcutaneous QHS  . insulin aspart  0-9 Units Subcutaneous TID WC  . midodrine  10 mg Oral TID WC  . multivitamin  1 tablet Oral QHS  . pantoprazole  40 mg Oral Daily  . QUEtiapine  12.5 mg Oral QHS   Continuous Infusions: . sodium chloride     PRN Meds:.acetaminophen, albuterol, bisacodyl, haloperidol lactate,  labetalol, magnesium citrate, nitroGLYCERIN, ondansetron (ZOFRAN) IV,  oxyCODONE, polyethylene glycol  Antibiotics  :   Anti-infectives (From admission, onward)   Start     Dose/Rate Route Frequency Ordered Stop   08/29/19 1000  doxycycline (VIBRA-TABS) tablet 100 mg     100 mg Oral Every 12 hours 08/28/19 1032 09/19/19 0959   08/27/19 1800  ceFEPIme (MAXIPIME) 2 g in sodium chloride 0.9 % 100 mL IVPB  Status:  Discontinued     2 g 200 mL/hr over 30 Minutes Intravenous Every T-Th-Sa (1800) 08/27/19 1343 08/28/19 1032   08/27/19 1345  ceFEPIme (MAXIPIME) 1 g in sodium chloride 0.9 % 100 mL IVPB  Status:  Discontinued     1 g 200 mL/hr over 30 Minutes Intravenous Every 24 hours 08/27/19 1338 08/27/19 1343   08/27/19 1345  vancomycin (VANCOCIN) IVPB 1000 mg/200 mL premix  Status:  Discontinued     1,000 mg 200 mL/hr over 60 Minutes Intravenous Every T-Th-Sa (Hemodialysis) 08/27/19 1338 08/28/19 1032   08/27/19 1200  vancomycin (VANCOCIN) IVPB 1000 mg/200 mL premix  Status:  Discontinued     1,000 mg 200 mL/hr over 60 Minutes Intravenous Every T-Th-Sa (Hemodialysis) 08/24/19 1135 08/26/19 1417   08/27/19 1200  vancomycin (VANCOCIN) IVPB 1000 mg/200 mL premix  Status:  Discontinued     1,000 mg 200 mL/hr over 60 Minutes Intravenous Every T-Th-Sa (Hemodialysis) 08/26/19 1417 08/27/19 1320   08/25/19 0700  ceFAZolin (ANCEF) IVPB 2g/100 mL premix  Status:  Discontinued     2 g 200 mL/hr over 30 Minutes Intravenous On call to O.R. 08/25/19 0645 08/25/19 0945   08/24/19 1200  vancomycin (VANCOCIN) IVPB 750 mg/150 ml premix  Status:  Discontinued     750 mg 150 mL/hr over 60 Minutes Intravenous Every T-Th-Sa (Hemodialysis) 08/24/19 1135 08/26/19 1417   08/22/19 1824  vancomycin (VANCOCIN) 1-5 GM/200ML-% IVPB    Note to Pharmacy: Ronny Bacon  : cabinet override      08/22/19 1824 08/23/19 0629   08/20/19 1200  vancomycin (VANCOCIN) IVPB 1000 mg/200 mL premix  Status:  Discontinued     1,000 mg 200 mL/hr over 60 Minutes Intravenous Every T-Th-Sa  (Hemodialysis) 08/19/19 1501 08/24/19 1135   08/19/19 2000  ceFEPIme (MAXIPIME) 1 g in sodium chloride 0.9 % 100 mL IVPB  Status:  Discontinued     1 g 200 mL/hr over 30 Minutes Intravenous Every 24 hours 08/18/19 2000 08/27/19 1320   08/18/19 2015  ceFEPIme (MAXIPIME) 2 g in sodium chloride 0.9 % 100 mL IVPB     2 g 200 mL/hr over 30 Minutes Intravenous  Once 08/18/19 2000 08/18/19 2139   08/18/19 2015  vancomycin (VANCOREADY) IVPB 500 mg/100 mL     500 mg 100 mL/hr over 60 Minutes Intravenous  Once 08/18/19 2000 08/18/19 2339   08/18/19 2000  vancomycin variable dose per unstable renal function (pharmacist dosing)  Status:  Discontinued      Does not apply See admin instructions 08/18/19 2000 08/19/19 1501          Objective:   Vitals:   08/27/19 2006 08/28/19 0541 08/28/19 0546 08/28/19 0825  BP: (!) 93/42  (!) 110/39 (!) 147/71  Pulse: 83  85 100  Resp:   20 18  Temp: 99.2 F (37.3 C)  99.2 F (37.3 C) 99.5 F (37.5 C)  TempSrc: Axillary  Oral Axillary  SpO2: 93%  97% 97%  Weight:  87.9 kg    Height:  SpO2: 97 % O2 Flow Rate (L/min): 4 L/min  Wt Readings from Last 3 Encounters:  08/28/19 87.9 kg  07/22/19 104.3 kg  06/11/19 98.4 kg     Intake/Output Summary (Last 24 hours) at 08/28/2019 1106 Last data filed at 08/28/2019 0629 Gross per 24 hour  Intake 120 ml  Output 1988 ml  Net -1868 ml     Physical Exam   Awake Alert, No new F.N deficits, Normal affect [place].AT,PERRAL Supple Neck,No JVD, No cervical lymphadenopathy appriciated.  Symmetrical Chest wall movement, Good air movement bilaterally, CTAB RRR,No Gallops, Rubs or new Murmurs, No Parasternal Heave +ve B.Sounds, Abd Soft, No tenderness, No organomegaly appriciated, No rebound - guarding or rigidity. Left AKA stump appears stable with wound VAC but being some bloody secretions, does have right heel ulcers as well.    Data Review:    Recent Labs  Lab 08/24/19 0249 08/24/19 0249  08/25/19 0323 08/25/19 1405 08/26/19 0245 08/27/19 0234 08/28/19 0250  WBC 9.1   < > 9.1 10.5 11.0* 10.0 8.9  HGB 9.2*   < > 8.2* 8.5* 8.2* 7.8* 7.9*  HCT 29.2*   < > 25.9* 26.8* 25.6* 24.6* 24.5*  PLT 199   < > 185 201 205 196 190  MCV 78.9*   < > 78.7* 78.6* 78.5* 79.1* 79.8*  MCH 24.9*   < > 24.9* 24.9* 25.2* 25.1* 25.7*  MCHC 31.5   < > 31.7 31.7 32.0 31.7 32.2  RDW 18.9*   < > 18.7* 18.7* 18.8* 18.7* 19.0*  LYMPHSABS 1.0  --  1.0  --  0.7 1.4 1.3  MONOABS 1.2*  --  1.0  --  1.2* 1.6* 1.2*  EOSABS 0.5  --  0.5  --  0.0 0.4 0.4  BASOSABS 0.1  --  0.1  --  0.0 0.1 0.1   < > = values in this interval not displayed.    Recent Labs  Lab 08/24/19 0249 08/25/19 0323 08/25/19 1405 08/26/19 0245 08/27/19 1400  NA 136 137 136 139 135  K 3.8 3.5 4.7 4.3 4.2  CL 97* 100 98 99 96*  CO2 24 24 23 25 24   GLUCOSE 130* 151* 189* 133* 133*  BUN 25* 32* 36* 24* 42*  CREATININE 6.69* 7.92* 8.71* 6.38* 8.69*  CALCIUM 9.0 8.6* 9.0 8.7* 8.5*  AST 14*  --   --   --   --   ALT 7  --   --   --   --   ALKPHOS 48  --   --   --   --   BILITOT 1.0  --   --   --   --   ALBUMIN 2.8*  --  2.4*  --  2.2*  MG 1.9 1.8  --   --   --   CRP  --  11.5*  --  11.5*  --   INR  --  1.3*  --   --   --     Recent Labs  Lab 08/22/19 0425 08/25/19 0323 08/26/19 0245  CRP  --  11.5* 11.5*  SARSCOV2NAA NEGATIVE  --   --     ------------------------------------------------------------------------------------------------------------------ No results for input(s): CHOL, HDL, LDLCALC, TRIG, CHOLHDL, LDLDIRECT in the last 72 hours.  Lab Results  Component Value Date   HGBA1C 6.2 (H) 08/18/2019   ------------------------------------------------------------------------------------------------------------------ No results for input(s): TSH, T4TOTAL, T3FREE, THYROIDAB in the last 72 hours.  Invalid input(s):  FREET3 ------------------------------------------------------------------------------------------------------------------ No results for input(s): VITAMINB12, FOLATE,  FERRITIN, TIBC, IRON, RETICCTPCT in the last 72 hours.  Coagulation profile Recent Labs  Lab 08/25/19 0323  INR 1.3*    No results for input(s): DDIMER in the last 72 hours.  Cardiac Enzymes No results for input(s): CKMB, TROPONINI, MYOGLOBIN in the last 168 hours.  Invalid input(s): CK ------------------------------------------------------------------------------------------------------------------    Component Value Date/Time   BNP 1,371.0 (H) 08/13/2017 1147    Micro Results Recent Results (from the past 240 hour(s))  Culture, blood (routine x 2)     Status: None   Collection Time: 08/18/19  8:09 PM   Specimen: BLOOD LEFT HAND  Result Value Ref Range Status   Specimen Description BLOOD LEFT HAND  Final   Special Requests   Final    BOTTLES DRAWN AEROBIC AND ANAEROBIC Blood Culture adequate volume   Culture   Final    NO GROWTH 5 DAYS Performed at [facility], [place] [address], [place], [place] [address]    Report Status 08/23/2019 FINAL  Final  Culture, blood (routine x 2)     Status: None   Collection Time: 08/18/19  8:17 PM   Specimen: BLOOD LEFT HAND  Result Value Ref Range Status   Specimen Description BLOOD LEFT HAND  Final   Special Requests   Final    BOTTLES DRAWN AEROBIC AND ANAEROBIC Blood Culture adequate volume   Culture   Final    NO GROWTH 5 DAYS Performed at [facility], [place] [address], [place], [place] [address]    Report Status 08/23/2019 FINAL  Final  MRSA PCR Screening     Status: None   Collection Time: 08/19/19  8:23 AM   Specimen: Nasal Mucosa; Nasopharyngeal  Result Value Ref Range Status   MRSA by PCR NEGATIVE NEGATIVE Final    Comment:        The GeneXpert MRSA Assay (FDA approved for NASAL specimens only), is one component of a comprehensive MRSA  colonization surveillance program. It is not intended to diagnose MRSA infection nor to guide or monitor treatment for MRSA infections. Performed at [facility], [place] [address], [place], [place] [address]   SARS CORONAVIRUS 2 (TAT 6-24 HRS) Nasopharyngeal Nasopharyngeal Swab     Status: None   Collection Time: 08/22/19  4:25 AM   Specimen: Nasopharyngeal Swab  Result Value Ref Range Status   SARS Coronavirus 2 NEGATIVE NEGATIVE Final    Comment: (NOTE) SARS-CoV-2 target nucleic acids are NOT DETECTED. The SARS-CoV-2 RNA is generally detectable in upper and lower respiratory specimens during the acute phase of infection. Negative results do not preclude SARS-CoV-2 infection, do not rule out co-infections with other pathogens, and should not be used as the sole basis for treatment or other patient management decisions. Negative results must be combined with clinical observations, patient history, and epidemiological information. The expected result is Negative. Fact Sheet for Patients: SugarRoll.be Fact Sheet for Healthcare Providers: https://www.woods-mathews.com/ This test is not yet approved or cleared by the [place] FDA and  has been authorized for detection and/or diagnosis of SARS-CoV-2 by FDA under an Emergency Use Authorization (EUA). This EUA will remain  in effect (meaning this test can be used) for the duration of the COVID-19 declaration under Section 56 4(b)(1) of the Act, 21 U.S.C. section 360bbb-3(b)(1), unless the authorization is terminated or revoked sooner. Performed at [facility], [place] [address] [place], [place] [address]     Radiology Reports DG Chest 1 View  Result Date:  08/18/2019 CLINICAL DATA:  [place]. EXAM: CHEST  1 VIEW COMPARISON:  May 29, 2019 FINDINGS: There is no evidence of acute infiltrate, pleural effusion or pneumothorax. There is stable marked severity enlargement of the cardiac  silhouette. Multiple radiopaque surgical clips are seen along the left axilla. Degenerative changes seen throughout the thoracic spine. IMPRESSION: Stable cardiomegaly without evidence of acute or active cardiopulmonary disease. Electronically Signed   By: Virgina Norfolk M.D.   On: 08/18/2019 21:45   DG Ankle Complete Left  Result Date: 08/19/2019 CLINICAL DATA:  Pain and swelling. Osteomyelitis. EXAM: LEFT ANKLE COMPLETE - 3+ VIEW COMPARISON:  None. FINDINGS: There is no acute displaced fracture or dislocation. There is osteopenia which limits detection of nondisplaced fractures. There may be ulceration overlying the medial malleolus without definite evidence for underlying osteomyelitis. There is a moderate-sized Achilles tendon enthesophyte. There is a small plantar calcaneal spur. Advanced vascular calcifications are noted. IMPRESSION: 1. No acute displaced fracture or dislocation. 2. Possible ulceration overlying the medial malleolus without definite evidence for underlying osteomyelitis. 3. Advanced vascular calcifications. Electronically Signed   By: Constance Holster M.D.   On: 08/19/2019 19:56   DG Ankle Complete Right  Result Date: 08/19/2019 CLINICAL DATA:  Osteomyelitis of the ankle and foot. EXAM: RIGHT ANKLE - COMPLETE 3+ VIEW COMPARISON:  February 01, 2013 FINDINGS: There are advanced degenerative changes of the ankle mortise with surrounding soft tissue swelling. There is no acute displaced fracture. No dislocation. Advanced degenerative changes are noted of the midfoot. There is a moderate-sized plantar calcaneal spur. There is an Achilles tendon enthesophyte. Vascular calcifications are noted. There is no radiographic evidence for osteomyelitis. IMPRESSION: 1. No acute osseous abnormality. 2. Advanced degenerative changes are noted of the midfoot and ankle mortise. Electronically Signed   By: Constance Holster M.D.   On: 08/19/2019 19:57   DG Abd 1 View  Result Date:  08/18/2019 CLINICAL DATA:  Vomiting, constipation EXAM: ABDOMEN - 1 VIEW COMPARISON:  05/12/2019 FINDINGS: There is a non obstructive bowel gas pattern. No supine evidence of free air. No organomegaly or suspicious calcification. No acute bony abnormality. Aortoiliac atherosclerosis. IMPRESSION: No acute findings. Electronically Signed   By: Rolm Baptise M.D.   On: 08/18/2019 21:40   MR FOOT RIGHT WO CONTRAST  Result Date: 08/26/2019 CLINICAL DATA:  Right heel ulcer. Prior fifth toe amputation 4 osteomyelitis. EXAM: MRI OF THE RIGHT FOOT WITHOUT CONTRAST TECHNIQUE: Multiplanar, multisequence MR imaging of the foot was performed. No intravenous contrast was administered. Osteomyelitis protocol MRI of the foot was obtained, to include the entire foot and ankle. This protocol uses a large field of view to cover the entire foot and ankle, and is suitable for assessing bony structures for osteomyelitis. Due to the large field of view and imaging plane choice, this protocol is less sensitive for assessing small structures such as ligamentous structures of the foot and ankle, compared to a dedicated forefoot or dedicated hindfoot exam. COMPARISON:  Right foot and ankle x-rays dated August 19, 2019. FINDINGS: Bones/Joint/Cartilage No suspicious marrow signal abnormality. Prior amputation of the fifth toe and metatarsal head. No fracture or dislocation. Advanced degenerative changes of the ankle and midfoot with midfoot collapse. Small tibiotalar joint effusion. Ligaments Collateral ligaments are intact. Muscles and Tendons Flexor, peroneal and extensor compartment tendons are intact. Achilles tendon is intact with mild distal tendinosis. Complete fatty atrophy of the intrinsic foot muscles. Soft tissue Focal susceptibility artifact within the plantar soft tissues at the level of the proximal  fourth metatarsal, corresponding to the thin linear foreign body seen on recent x-ray. Additional small area of susceptibility  artifact in the plantar soft tissues at the level of the second intermetatarsal space without evident foreign body seen on recent x-ray. No fluid collection or hematoma. No soft tissue mass. IMPRESSION: 1. No evidence of osteomyelitis. 2. Focal susceptibility artifact within the plantar soft tissues at the level of the proximal fourth metatarsal, corresponding to a thin linear foreign body seen on recent x-ray. 3. 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 Electronically Signed   By: Titus Dubin M.D.   On: 08/26/2019 17:29   PERIPHERAL VASCULAR CATHETERIZATION  Result Date: 08/22/2019 Patient name: Alexis [name] MRN: [id number] DOB: 10-03-1945 Sex: female 08/22/2019 Pre-operative Diagnosis: Bilateral lower extremity ulcer Post-operative diagnosis:  Same Surgeon:  [name] Procedure Performed:  1.  Ultrasound-guided access, right femoral artery  2.  Ultrasound-guided access, left femoral artery  3.  Abdominal aortogram  4.  Bilateral lower extremity runoff  5.  Intravascular lithotripsy, right superficial femoral and popliteal artery  6.  Drug-eluting stent, right superficial femoral-popliteal artery  7.  Conscious sedation, 143 minutes Indications: The patient has extensive bilateral lower extremity ulcers.  She is here today for limb salvage. Procedure:  The patient was identified in the holding area and taken to room 8.  The patient was then placed supine on the table and prepped and draped in the usual sterile fashion.  A time out was called.  Conscious sedation was administered with the use of IV fentanyl and Versed under continuous physician and nurse monitoring.  Heart rate, blood pressure, and oxygen saturation were continuously monitored.  Total sedation time was 143 minutes.  I initially tried to get access into the left common femoral artery, however I was unsuccessful therefore I turned my attention towards the right femoral artery.  Ultrasound was used to evaluate the right  common femoral artery.  It was patent .  A digital ultrasound image was acquired.  A micropuncture needle was used to access the right common femoral artery under ultrasound guidance.  An 018 wire was advanced without resistance and a micropuncture sheath was placed.  The 018 wire was removed and a benson wire was placed.  The micropuncture sheath was exchanged for a 5 french sheath.  An omniflush catheter was advanced over the wire to the level of L-1.  An abdominal angiogram was obtained.  Next, the cath was pulled out of the aortic bifurcation and bilateral runoff was performed.  I then followed up for the intervention by gaining access under ultrasound guidance of the left common femoral artery. Findings:  Aortogram: No significant infrarenal aortic stenosis.  Bilateral common and external iliac arteries are patent throughout the course however they are heavily calcified.   Right Lower Extremity:  The right common femoral and profundofemoral artery are heavily calcified but patent throughout their course.  The superficial femoral artery is diffusely diseased with multiple high-grade lesions down to the adductor canal where it occludes.  There is reconstitution of the popliteal artery just below the joint space.  There is two-vessel runoff via the peroneal and posterior tibial artery.  Left Lower Extremity: The common femoral and profundofemoral artery are patent without stenosis but heavily calcified.  The superficial femoral artery is occluded with reconstitution of the superficial femoral artery at the adductor canal with diffusely diseased three-vessel runoff and minimal opacification of the digital arteries. Intervention: After the above images were acquired the decision  made to proceed with intervention.  The sheath in the right groin was removed.  I tried to use a minx however this failed because the balloon popped.  Manual pressure was held for 10 minutes.  I then placed a 7 x 45 sheath from the left  groin into the right external iliac artery and fully heparinized the patient.  Using a quick cross catheter and 035 Glidewire followed by a Glidewire advantage I was able to get across the stenosis.  The lesion was predilated with a 4 x 100 balloon.  I then performed shockwave intravascular ultrasound of the popliteal artery just distal to the joint space up to its origin.  A 5 x 60 balloon was used for the distal half and a 6 x 60 balloon was used for the proximal half.  Total treatment time was 510 seconds.  I then elected to stent the treated area.  A 5 x 100 Tigris stent was placed distally followed by Elluvia 6 x 120, 7 x 120, and 7 x 40.  The stents were then molded with a 5 mm balloon and completion imaging showed inline flow down across the foot.  Catheters and wires were removed.  A short 7 French sheath was placed.  Patient tolerated the procedure well there were no complications. 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  Successfully recanalized and treated with shockwave intravascular lithotripsy followed by drug-eluting stent placement from just below the joint space in the popliteal artery up to the origin of the superficial femoral artery.  There is two-vessel runoff via the peroneal and posterior tibial artery.  #2  Left superficial femoral artery occlusion  V. [name], M.D., [facility] [place] Office: [phone] Pager:  [phone]  DG Foot 2 Views Left  Result Date: 08/19/2019 CLINICAL DATA:  Osteomyelitis of the ankle or foot. EXAM: LEFT FOOT - 2 VIEW COMPARISON:  08/18/2019 FINDINGS: Again noted is a foreign body in the medial soft tissues of the left foot. There are degenerative changes of the interphalangeal joints. Advanced vascular calcifications are noted. There is a moderate-sized plantar calcaneal spur. There is no acute displaced fracture or dislocation. There is no definite radiographic evidence for  osteomyelitis. IMPRESSION: 1. No definite radiographic evidence for osteomyelitis. 2. Stable foreign body in the medial soft tissues of the left foot. 3. Degenerative changes of the interphalangeal joints. Electronically Signed   By: Constance Holster M.D.   On: 08/19/2019 19:55   DG Foot Complete Right  Result Date: 08/19/2019 CLINICAL DATA:  Osteomyelitis. 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 There is a lucency at the head of the fifth metatarsal. There is some surrounding soft tissue swelling. Vascular calcifications are noted. There are degenerative changes of the midfoot. IMPRESSION: 1. No acute displaced fracture or dislocation. 2. 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 There is a persistent ill-defined lucency at the head of the fifth metatarsal which can be seen in patients with osteomyelitis. Electronically Signed   By: Constance Holster M.D.   On: 08/19/2019 19:59   VAS [place] ABI WITH/WO TBI  Result Date: 08/21/2019 LOWER EXTREMITY DOPPLER STUDY Indications: Gangrene, and peripheral artery disease. High Risk Factors: Diabetes.  Performing Technologist: June Leap Rvt, Rdms  Examination Guidelines: A complete evaluation includes at minimum, Doppler waveform signals and systolic  blood pressure reading at the level of bilateral brachial, anterior tibial, and posterior tibial arteries, when vessel segments are accessible. Bilateral testing is considered an integral part of a complete examination. Photoelectric Plethysmograph (PPG) waveforms and toe systolic pressure readings are included as required and additional duplex testing as needed. Limited examinations for reoccurring indications may be performed as noted.  ABI Findings: +---------+-----------------+-----+------------------+-------------------------+ Right    Rt Pressure      IndexWaveform           Comment                            (mmHg)                                                            +---------+-----------------+-----+------------------+-------------------------+ Brachial                                         BP not taken-restricted                                                    arm                       +---------+-----------------+-----+------------------+-------------------------+ ATA      206              1.02 dampened                                                                   monophasic                                  +---------+-----------------+-----+------------------+-------------------------+ PTA      147              0.73 dampened                                                                   monophasic                                  +---------+-----------------+-----+------------------+-------------------------+ Great Toe                      Abnormal                                    +---------+-----------------+-----+------------------+-------------------------+ +---------+------------------+-----+-------------------+-------+  Left     Lt Pressure (mmHg)IndexWaveform           Comment +---------+------------------+-----+-------------------+-------+ Brachial 201                    triphasic                  +---------+------------------+-----+-------------------+-------+ ATA      97                0.48 dampened monophasic        +---------+------------------+-----+-------------------+-------+ PTA      19                0.09 dampened monophasic        +---------+------------------+-----+-------------------+-------+ Great Toe                       Abnormal                   +---------+------------------+-----+-------------------+-------+ Great toe PPG waveform too dampened to obtain pressures bilaterally.  Summary: Right: ABI is within normal range by ATA pressure, however abnormal pedal  artery waveforms suggest this is falsely elevated. ABI is in the moderate range based on PTA pressure. Severely dampened PPG waveform of Great toe. Left: Resting left ankle-brachial index indicates severe left lower extremity arterial disease. Severely dampened PPG waveform of Great toe.  *See table(s) above for measurements and observations.  Electronically signed by Servando Snare MD on 08/21/2019 at 5:13:35 PM.    Final     Time Spent in minutes  30   Lala Lund M.D on 08/28/2019 at 11:06 AM  To page go to [email or web address] - password [facility]

## 2019-08-29 DIAGNOSIS — E11621 Type 2 diabetes mellitus with foot ulcer: Secondary | ICD-10-CM | POA: Diagnosis not present

## 2019-08-29 DIAGNOSIS — M86172 Other acute osteomyelitis, left ankle and foot: Secondary | ICD-10-CM | POA: Diagnosis not present

## 2019-08-29 DIAGNOSIS — N186 End stage renal disease: Secondary | ICD-10-CM | POA: Diagnosis not present

## 2019-08-29 DIAGNOSIS — M869 Osteomyelitis, unspecified: Secondary | ICD-10-CM | POA: Diagnosis not present

## 2019-08-29 LAB — GLUCOSE, CAPILLARY
Glucose-Capillary: 133 mg/dL — ABNORMAL HIGH (ref 70–99)
Glucose-Capillary: 161 mg/dL — ABNORMAL HIGH (ref 70–99)
Glucose-Capillary: 101 mg/dL — ABNORMAL HIGH (ref 70–99)
Glucose-Capillary: 102 mg/dL — ABNORMAL HIGH (ref 70–99)
Glucose-Capillary: 94 mg/dL (ref 70–99)

## 2019-08-29 LAB — CBC WITH DIFFERENTIAL/PLATELET
Abs Immature Granulocytes: 0.1 10*3/uL — ABNORMAL HIGH (ref 0.00–0.07)
Basophils Absolute: 0.1 10*3/uL (ref 0.0–0.1)
Basophils Relative: 1 %
Eosinophils Absolute: 0.5 10*3/uL (ref 0.0–0.5)
Eosinophils Relative: 5 %
HCT: 24.5 % — ABNORMAL LOW (ref 36.0–46.0)
Hemoglobin: 8 g/dL — ABNORMAL LOW (ref 12.0–15.0)
Immature Granulocytes: 1 %
Lymphocytes Relative: 13 %
Lymphs Abs: 1.3 10*3/uL (ref 0.7–4.0)
MCH: 26 pg (ref 26.0–34.0)
MCHC: 32.7 g/dL (ref 30.0–36.0)
MCV: 79.5 fL — ABNORMAL LOW (ref 80.0–100.0)
Monocytes Absolute: 1.2 10*3/uL — ABNORMAL HIGH (ref 0.1–1.0)
Monocytes Relative: 13 %
Neutro Abs: 6.3 10*3/uL (ref 1.7–7.7)
Neutrophils Relative %: 67 %
Platelets: 200 10*3/uL (ref 150–400)
RBC: 3.08 MIL/uL — ABNORMAL LOW (ref 3.87–5.11)
RDW: 19 % — ABNORMAL HIGH (ref 11.5–15.5)
WBC: 9.4 10*3/uL (ref 4.0–10.5)
nRBC: 0.3 % — ABNORMAL HIGH (ref 0.0–0.2)

## 2019-08-29 LAB — RENAL FUNCTION PANEL
Albumin: 2.3 g/dL — ABNORMAL LOW (ref 3.5–5.0)
Anion gap: 13 (ref 5–15)
BUN: 26 mg/dL — ABNORMAL HIGH (ref 8–23)
CO2: 26 mmol/L (ref 22–32)
Calcium: 9.2 mg/dL (ref 8.9–10.3)
Chloride: 99 mmol/L (ref 98–111)
Creatinine, Ser: 6.45 mg/dL — ABNORMAL HIGH (ref 0.44–1.00)
GFR calc Af Amer: 7 mL/min — ABNORMAL LOW (ref >60)
GFR calc non Af Amer: 6 mL/min — ABNORMAL LOW (ref >60)
Glucose, Bld: 145 mg/dL — ABNORMAL HIGH (ref 70–99)
Phosphorus: 4.1 mg/dL (ref 2.5–4.6)
Potassium: 3.7 mmol/L (ref 3.5–5.1)
Sodium: 138 mmol/L (ref 135–145)

## 2019-08-29 LAB — PREPARE RBC (CROSSMATCH)

## 2019-08-29 MED ORDER — DOXERCALCIFEROL 4 MCG/2ML IV SOLN
INTRAVENOUS | Status: AC
  Filled 2019-08-29: qty 2

## 2019-08-29 MED ORDER — APIXABAN 2.5 MG PO TABS
2.5000 mg | ORAL_TABLET | Freq: Two times a day (BID) | ORAL | Status: DC
  Administered 2019-08-29 – 2019-08-31 (×4): 2.5 mg via ORAL
  Filled 2019-08-29 (×5): qty 1

## 2019-08-29 MED ORDER — MIDODRINE HCL 5 MG PO TABS
ORAL_TABLET | ORAL | Status: AC
  Administered 2019-08-29: 10 mg via ORAL
  Filled 2019-08-29: qty 2

## 2019-08-29 MED ORDER — SODIUM CHLORIDE 0.9% IV SOLUTION: Freq: Once | INTRAVENOUS | Status: AC

## 2019-08-29 NOTE — Procedures (Signed)
Patient seen on Hemodialysis. BP (!) 115/54   Pulse 77   Temp 98.5 F (36.9 C) (Oral)   Resp 17   Ht 5\' 6"  (1.676 m)   Wt 90.9 kg   SpO2 96%   BMI 32.35 kg/m   QB 400, UF goal 1.5L Tolerating treatment without problems at this time.  She had symptomatic dizziness with repositioning yesterday and continues to have blood loss from wound vac-- I will order a unit of PRBCs at HD today for symptomatic anemia.   Elmarie Shiley MD [facility]. Office # [phone] Pager # [phone] 10:18 AM

## 2019-08-29 NOTE — Progress Notes (Signed)
Wound vac alarming. RN to assess. Pt picking off dressing, RN attempted to redress wound vac. Pt refusing RN to redress it. Will continue to monitor.  Arletta Bale, RN

## 2019-08-29 NOTE — TOC Progression Note (Addendum)
Transition of Care [facility]) - Progression Note    Patient Details  Name: Alexis [name] MRN: [id number] Date of Birth: 1945/06/29  [facility]) CM/SW [place], [place] Phone Number: 08/29/2019, 4:59 PM  Clinical Narrative:     Patient's spouse, Kennyth Lose, states SNF choice is [facility]. CSW contacted [facility] and they confirmed bed offer. [facility] states patient will need to be set for HD on M,W,F, after 8am.   CSW contacted HD Renal Coordinator/[place], and informed of SNF choice w/ [facility]. HD Coordinator will inform CSW if able to set outpatient HD clinic for times requested by SNF.  CSW contacted patient's insurance -SNF authorization remains pending.   Thurmond Butts, MSW, LCSWA Clinical Social Worker   Expected Discharge Plan: Skilled Nursing Facility Barriers to Discharge: Ship broker, Continued Medical Work up, SNF Pending bed offer(patient will need outpatient HD clinic setup w/SNF)  Expected Discharge Plan and Services Expected Discharge Plan: [place] In-house Referral: Clinical Social Work                                             Social Determinants of Health (SDOH) Interventions    Readmission Risk Interventions Readmission Risk Prevention Plan 06/12/2019  Transportation Screening Complete  PCP or Specialist Appt within 3-5 Days Not Complete  HRI or [place] Complete  Social Work Consult for [place] Planning/Counseling Complete  Palliative Care Screening Not Complete  Medication Review Press photographer) Complete  Some recent data might be hidden

## 2019-08-29 NOTE — Progress Notes (Signed)
PROGRESS NOTE                                                                                                                                                                                                             Patient Demographics:    Alexis [name], is a 74 y.o. female, DOB - 05-25-1945, PIR:[id number]  Admit date - 08/18/2019   Admitting Physician Shela Leff, MD  Outpatient Primary MD for the patient is System, Provider Not In  LOS - 11  No chief complaint on file.      Brief Narrative  [name] a 74 y.o.femalewith medical history significant ofA. fib on Eliquis, ESRD on HD TTS, CAD, pulmonary hypertension, LBBB, hypertension, hyperlipidemia, CHF, type 2 diabetes, diabetic neuropathy, [facility] with complaints of bilateral heel ulcers. Diagnosed with osteomyelitis of the right fifth metatarsal head and transferred to [facility] for further management as she is a dialysis patient.   Subjective:   Patient in bed, appears comfortable, denies any headache, no fever, no chest pain or pressure, no shortness of breath , no abdominal pain. No focal weakness.   Assessment  & Plan :     Diabetic foot ulcers/concern for osteomyelitis: Also neuropathic and vascular ulcers. 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 There was question of distal fibula having signal suggestive of possible infection noted on previous imaging, she has underwent revascularization of the R Leg this admission on 08/22/2019 by vascular surgery and received a drug-eluting stent and currently on dual antiplatelet therapy, right heel ulcer reviewed by Dr. Sharol Given who thinks that there is minimal to no active infection in the right foot and leg and that patient now should be treated with 3 weeks of oral doxycycline which will be started on 08/28/2019.    Continued oozing of blood from the left AKA stump site into  the wound VAC, closely monitor, low threshold to transfuse, consent obtained and type crossmatch done.  Right leg drug-eluting stent placed this admission by vascular surgery.  Case discussed by me with Dr. Trula Slade on 08/27/2019, for now continue dual antiplatelet therapy along with anticoagulation as needed and when able to.   Paroxysmal atrial fibrillation: [place] vas 2 score of at least 3.  Continue amiodarone, will resume Eliquis on 08/29/2019 and monitor for any signs of increased bleeding in the wound VAC, case discussed with Dr. Sharol Given on 08/29/2019.  Anemia of chronic disease.   Some  oozing in the wound VAC of the left AKA stump,  currently she is on dual antiplatelet therapy for the new DES stent in the right leg.  Even though anemia is moderate she was symptomatic while working with PT on 08/28/2019 getting a unit of packed RBC on 08/29/2019 during HD, will monitor with Eliquis being resumed on 08/29/2019.  Hypotension.  Chronic.  Increased midodrine dose on 08/28/2019 will monitor.    ESRD on HD: Receiving dialysis on schedule as per nephrology. TTS.  Dementia with hospital-acquired delirium due to unfamiliar setting along with toxic encephalopathy due to infection.  Supportive care, minimize narcotics and benzodiazepines, family made aware that her delirium might get worse, as needed Haldol and nighttime Seroquel.    Insulin-dependent type 2 diabetes: Takes 70/30 insulin at home.  Blood sugars well controlled currently on sliding scale insulin.  Lab Results  Component Value Date   HGBA1C 6.2 (H) 08/18/2019   CBG (last 3)  Recent Labs    08/28/19 2141 08/29/19 0534 08/29/19 0609  GLUCAP 146* 161* 133*      Family Communication  :  Daughter [name] [phone], 08/24/19, Husband [name] on [phone], 08/24/19.  Husband bedside on 08/25/2019, 08/27/2019  Code Status :  Full  Disposition Plan  : In the hospital for treatment of gangrene, s/p left AKA on 08/25/2019, now having  some postop bleeding from the left AKA stump site, his H&H remained stable and she is hemodynamically stable likely discharge to SNF most likely on 08/30/2019.  Consults  :  Ortho, Renal  Procedures  :    MRI R foot - Non acute  Left AKA on 08/25/2019 by Dr. Sharol Given.    R Leg - revascularization 08/22/2019 - " Intravascular lithotripsy, right superficial femoral and popliteal artery and Drug-eluting stent, right superficial femoral-popliteal artery" Continue to hold anticoagulation for anticipated surgical procedure.  Further management per orthopedics if planning any debridements.   DVT Prophylaxis  :  Eliquis  Lab Results  Component Value Date   PLT 200 08/29/2019    Diet :  Diet Order            Diet renal with fluid restriction Fluid restriction: 1200 mL Fluid; Room service appropriate? No; Fluid consistency: Thin  Diet effective now               Inpatient Medications Scheduled Meds: . amiodarone  200 mg Oral Daily  . aspirin EC  81 mg Oral Daily  . atorvastatin  80 mg Oral Daily  . clopidogrel  75 mg Oral Q breakfast  . collagenase   Topical Daily  . darbepoetin (ARANESP) injection - DIALYSIS  60 mcg Intravenous Q Tue-HD  . docusate sodium  100 mg Oral BID  . doxercalciferol      . doxercalciferol  3 mcg Intravenous Q T,Th,Sa-HD  . doxycycline  100 mg Oral Q12H  . feeding supplement (NEPRO CARB STEADY)  237 mL Oral TID BM  . feeding supplement (PRO-STAT SUGAR FREE 64)  30 mL Oral BID  . ferric citrate  420 mg Oral TID WC  . heparin injection (subcutaneous)  5,000 Units Subcutaneous Q8H  . insulin aspart  0-5 Units Subcutaneous QHS  . insulin aspart  0-9 Units Subcutaneous TID WC  . midodrine  10 mg Oral TID WC  . multivitamin  1 tablet Oral QHS  . pantoprazole  40 mg Oral Daily  . QUEtiapine  12.5 mg Oral QHS   Continuous Infusions: . sodium chloride  PRN Meds:.acetaminophen, albuterol, bisacodyl, haloperidol lactate, labetalol, magnesium citrate,  nitroGLYCERIN, ondansetron (ZOFRAN) IV, oxyCODONE, polyethylene glycol  Antibiotics  :   Anti-infectives (From admission, onward)   Start     Dose/Rate Route Frequency Ordered Stop   08/29/19 1000  doxycycline (VIBRA-TABS) tablet 100 mg     100 mg Oral Every 12 hours 08/28/19 1032 09/19/19 0959   08/27/19 1800  ceFEPIme (MAXIPIME) 2 g in sodium chloride 0.9 % 100 mL IVPB  Status:  Discontinued     2 g 200 mL/hr over 30 Minutes Intravenous Every T-Th-Sa (1800) 08/27/19 1343 08/28/19 1032   08/27/19 1345  ceFEPIme (MAXIPIME) 1 g in sodium chloride 0.9 % 100 mL IVPB  Status:  Discontinued     1 g 200 mL/hr over 30 Minutes Intravenous Every 24 hours 08/27/19 1338 08/27/19 1343   08/27/19 1345  vancomycin (VANCOCIN) IVPB 1000 mg/200 mL premix  Status:  Discontinued     1,000 mg 200 mL/hr over 60 Minutes Intravenous Every T-Th-Sa (Hemodialysis) 08/27/19 1338 08/28/19 1032   08/27/19 1200  vancomycin (VANCOCIN) IVPB 1000 mg/200 mL premix  Status:  Discontinued     1,000 mg 200 mL/hr over 60 Minutes Intravenous Every T-Th-Sa (Hemodialysis) 08/24/19 1135 08/26/19 1417   08/27/19 1200  vancomycin (VANCOCIN) IVPB 1000 mg/200 mL premix  Status:  Discontinued     1,000 mg 200 mL/hr over 60 Minutes Intravenous Every T-Th-Sa (Hemodialysis) 08/26/19 1417 08/27/19 1320   08/25/19 0700  ceFAZolin (ANCEF) IVPB 2g/100 mL premix  Status:  Discontinued     2 g 200 mL/hr over 30 Minutes Intravenous On call to O.R. 08/25/19 0645 08/25/19 0945   08/24/19 1200  vancomycin (VANCOCIN) IVPB 750 mg/150 ml premix  Status:  Discontinued     750 mg 150 mL/hr over 60 Minutes Intravenous Every T-Th-Sa (Hemodialysis) 08/24/19 1135 08/26/19 1417   08/22/19 1824  vancomycin (VANCOCIN) 1-5 GM/200ML-% IVPB    Note to Pharmacy: Ronny Bacon  : cabinet override      08/22/19 1824 08/23/19 0629   08/20/19 1200  vancomycin (VANCOCIN) IVPB 1000 mg/200 mL premix  Status:  Discontinued     1,000 mg 200 mL/hr over 60 Minutes  Intravenous Every T-Th-Sa (Hemodialysis) 08/19/19 1501 08/24/19 1135   08/19/19 2000  ceFEPIme (MAXIPIME) 1 g in sodium chloride 0.9 % 100 mL IVPB  Status:  Discontinued     1 g 200 mL/hr over 30 Minutes Intravenous Every 24 hours 08/18/19 2000 08/27/19 1320   08/18/19 2015  ceFEPIme (MAXIPIME) 2 g in sodium chloride 0.9 % 100 mL IVPB     2 g 200 mL/hr over 30 Minutes Intravenous  Once 08/18/19 2000 08/18/19 2139   08/18/19 2015  vancomycin (VANCOREADY) IVPB 500 mg/100 mL     500 mg 100 mL/hr over 60 Minutes Intravenous  Once 08/18/19 2000 08/18/19 2339   08/18/19 2000  vancomycin variable dose per unstable renal function (pharmacist dosing)  Status:  Discontinued      Does not apply See admin instructions 08/18/19 2000 08/19/19 1501          Objective:   Vitals:   08/29/19 1000 08/29/19 1018 08/29/19 1043 08/29/19 1100  BP: (!) 115/54 (!) 97/37 (!) 117/48 (!) 119/52  Pulse: 77 78 77 76  Resp:  18 18 18   Temp:  98.6 F (37 C) 98.5 F (36.9 C) 98.4 F (36.9 C)  TempSrc:  Oral Oral Oral  SpO2:  100% 99% 98%  Weight:  Height:        SpO2: 98 % O2 Flow Rate (L/min): 4 L/min  Wt Readings from Last 3 Encounters:  08/29/19 90.9 kg  07/22/19 104.3 kg  06/11/19 98.4 kg     Intake/Output Summary (Last 24 hours) at 08/29/2019 1129 Last data filed at 08/29/2019 1100 Gross per 24 hour  Intake 26.67 ml  Output 75 ml  Net -48.33 ml     Physical Exam   Awake Alert, No new F.N deficits, Normal affect [place].AT,PERRAL Supple Neck,No JVD, No cervical lymphadenopathy appriciated.  Symmetrical Chest wall movement, Good air movement bilaterally, CTAB RRR,No Gallops, Rubs or new Murmurs, No Parasternal Heave +ve B.Sounds, Abd Soft, No tenderness, No organomegaly appriciated, No rebound - guarding or rigidity. Left AKA stump appears stable with wound VAC but being some bloody secretions, does have right heel ulcers as well.    Data Review:    Recent Labs  Lab 08/25/19 0323  08/25/19 0323 08/25/19 1405 08/26/19 0245 08/27/19 0234 08/28/19 0250 08/29/19 0331  WBC 9.1   < > 10.5 11.0* 10.0 8.9 9.4  HGB 8.2*   < > 8.5* 8.2* 7.8* 7.9* 8.0*  HCT 25.9*   < > 26.8* 25.6* 24.6* 24.5* 24.5*  PLT 185   < > 201 205 196 190 200  MCV 78.7*   < > 78.6* 78.5* 79.1* 79.8* 79.5*  MCH 24.9*   < > 24.9* 25.2* 25.1* 25.7* 26.0  MCHC 31.7   < > 31.7 32.0 31.7 32.2 32.7  RDW 18.7*   < > 18.7* 18.8* 18.7* 19.0* 19.0*  LYMPHSABS 1.0  --   --  0.7 1.4 1.3 1.3  MONOABS 1.0  --   --  1.2* 1.6* 1.2* 1.2*  EOSABS 0.5  --   --  0.0 0.4 0.4 0.5  BASOSABS 0.1  --   --  0.0 0.1 0.1 0.1   < > = values in this interval not displayed.    Recent Labs  Lab 08/24/19 0249 08/24/19 0249 08/25/19 0323 08/25/19 1405 08/26/19 0245 08/27/19 1400 08/29/19 0809  NA 136   < > 137 136 139 135 138  K 3.8   < > 3.5 4.7 4.3 4.2 3.7  CL 97*   < > 100 98 99 96* 99  CO2 24   < > 24 23 25 24 26   GLUCOSE 130*   < > 151* 189* 133* 133* 145*  BUN 25*   < > 32* 36* 24* 42* 26*  CREATININE 6.69*   < > 7.92* 8.71* 6.38* 8.69* 6.45*  CALCIUM 9.0   < > 8.6* 9.0 8.7* 8.5* 9.2  AST 14*  --   --   --   --   --   --   ALT 7  --   --   --   --   --   --   ALKPHOS 48  --   --   --   --   --   --   BILITOT 1.0  --   --   --   --   --   --   ALBUMIN 2.8*  --   --  2.4*  --  2.2* 2.3*  MG 1.9  --  1.8  --   --   --   --   CRP  --   --  11.5*  --  11.5*  --   --   INR  --   --  1.3*  --   --   --   --    < > =  values in this interval not displayed.    Recent Labs  Lab 08/25/19 0323 08/26/19 0245  CRP 11.5* 11.5*    ------------------------------------------------------------------------------------------------------------------ No results for input(s): CHOL, HDL, LDLCALC, TRIG, CHOLHDL, LDLDIRECT in the last 72 hours.  Lab Results  Component Value Date   HGBA1C 6.2 (H) 08/18/2019    ------------------------------------------------------------------------------------------------------------------ No results for input(s): TSH, T4TOTAL, T3FREE, THYROIDAB in the last 72 hours.  Invalid input(s): FREET3 ------------------------------------------------------------------------------------------------------------------ No results for input(s): VITAMINB12, FOLATE, FERRITIN, TIBC, IRON, RETICCTPCT in the last 72 hours.  Coagulation profile Recent Labs  Lab 08/25/19 0323  INR 1.3*    No results for input(s): DDIMER in the last 72 hours.  Cardiac Enzymes No results for input(s): CKMB, TROPONINI, MYOGLOBIN in the last 168 hours.  Invalid input(s): CK ------------------------------------------------------------------------------------------------------------------    Component Value Date/Time   BNP 1,371.0 (H) 08/13/2017 1147    Micro Results Recent Results (from the past 240 hour(s))  SARS CORONAVIRUS 2 (TAT 6-24 HRS) Nasopharyngeal Nasopharyngeal Swab     Status: None   Collection Time: 08/22/19  4:25 AM   Specimen: Nasopharyngeal Swab  Result Value Ref Range Status   SARS Coronavirus 2 NEGATIVE NEGATIVE Final    Comment: (NOTE) SARS-CoV-2 target nucleic acids are NOT DETECTED. The SARS-CoV-2 RNA is generally detectable in upper and lower respiratory specimens during the acute phase of infection. Negative results do not preclude SARS-CoV-2 infection, do not rule out co-infections with other pathogens, and should not be used as the sole basis for treatment or other patient management decisions. Negative results must be combined with clinical observations, patient history, and epidemiological information. The expected result is Negative. Fact Sheet for Patients: SugarRoll.be Fact Sheet for Healthcare Providers: https://www.woods-mathews.com/ This test is not yet approved or cleared by the [place] FDA and  has  been authorized for detection and/or diagnosis of SARS-CoV-2 by FDA under an Emergency Use Authorization (EUA). This EUA will remain  in effect (meaning this test can be used) for the duration of the COVID-19 declaration under Section 56 4(b)(1) of the Act, 21 U.S.C. section 360bbb-3(b)(1), unless the authorization is terminated or revoked sooner. Performed at [facility], [place] [address] [place], [place] [address]     Radiology Reports DG Chest 1 View  Result Date: 08/18/2019 CLINICAL DATA:  Rales. EXAM: CHEST  1 VIEW COMPARISON:  May 29, 2019 FINDINGS: There is no evidence of acute infiltrate, pleural effusion or pneumothorax. There is stable marked severity enlargement of the cardiac silhouette. Multiple radiopaque surgical clips are seen along the left axilla. Degenerative changes seen throughout the thoracic spine. IMPRESSION: Stable cardiomegaly without evidence of acute or active cardiopulmonary disease. Electronically Signed   By: Virgina Norfolk M.D.   On: 08/18/2019 21:45   DG Ankle Complete Left  Result Date: 08/19/2019 CLINICAL DATA:  Pain and swelling. Osteomyelitis. EXAM: LEFT ANKLE COMPLETE - 3+ VIEW COMPARISON:  None. FINDINGS: There is no acute displaced fracture or dislocation. There is osteopenia which limits detection of nondisplaced fractures. There may be ulceration overlying the medial malleolus without definite evidence for underlying osteomyelitis. There is a moderate-sized Achilles tendon enthesophyte. There is a small plantar calcaneal spur. Advanced vascular calcifications are noted. IMPRESSION: 1. No acute displaced fracture or dislocation. 2. Possible ulceration overlying the medial malleolus without definite evidence for underlying osteomyelitis. 3. Advanced vascular calcifications. Electronically Signed   By: Constance Holster M.D.   On: 08/19/2019 19:56   DG Ankle Complete Right  Result Date: 08/19/2019 CLINICAL DATA:  Osteomyelitis of the ankle and  foot. EXAM: RIGHT ANKLE - COMPLETE 3+ VIEW COMPARISON:  February 01, 2013 FINDINGS: There are advanced degenerative changes of the ankle mortise with surrounding soft tissue swelling. There is no acute displaced fracture. No dislocation. Advanced degenerative changes are noted of the midfoot. There is a moderate-sized plantar calcaneal spur. There is an Achilles tendon enthesophyte. Vascular calcifications are noted. There is no radiographic evidence for osteomyelitis. IMPRESSION: 1. No acute osseous abnormality. 2. Advanced degenerative changes are noted of the midfoot and ankle mortise. Electronically Signed   By: Constance Holster M.D.   On: 08/19/2019 19:57   DG Abd 1 View  Result Date: 08/18/2019 CLINICAL DATA:  Vomiting, constipation EXAM: ABDOMEN - 1 VIEW COMPARISON:  05/12/2019 FINDINGS: There is a non obstructive bowel gas pattern. No supine evidence of free air. No organomegaly or suspicious calcification. No acute bony abnormality. Aortoiliac atherosclerosis. IMPRESSION: No acute findings. Electronically Signed   By: Rolm Baptise M.D.   On: 08/18/2019 21:40   MR FOOT RIGHT WO CONTRAST  Result Date: 08/26/2019 CLINICAL DATA:  Right heel ulcer. Prior fifth toe amputation 4 osteomyelitis. EXAM: MRI OF THE RIGHT FOOT WITHOUT CONTRAST TECHNIQUE: Multiplanar, multisequence MR imaging of the foot was performed. No intravenous contrast was administered. Osteomyelitis protocol MRI of the foot was obtained, to include the entire foot and ankle. This protocol uses a large field of view to cover the entire foot and ankle, and is suitable for assessing bony structures for osteomyelitis. Due to the large field of view and imaging plane choice, this protocol is less sensitive for assessing small structures such as ligamentous structures of the foot and ankle, compared to a dedicated forefoot or dedicated hindfoot exam. COMPARISON:  Right foot and ankle x-rays dated August 19, 2019. FINDINGS: Bones/Joint/Cartilage  No suspicious marrow signal abnormality. Prior amputation of the fifth toe and metatarsal head. No fracture or dislocation. Advanced degenerative changes of the ankle and midfoot with midfoot collapse. Small tibiotalar joint effusion. Ligaments Collateral ligaments are intact. Muscles and Tendons Flexor, peroneal and extensor compartment tendons are intact. Achilles tendon is intact with mild distal tendinosis. Complete fatty atrophy of the intrinsic foot muscles. Soft tissue Focal susceptibility artifact within the plantar soft tissues at the level of the proximal fourth metatarsal, corresponding to the thin linear foreign body seen on recent x-ray. Additional small area of susceptibility artifact in the plantar soft tissues at the level of the second intermetatarsal space without evident foreign body seen on recent x-ray. No fluid collection or hematoma. No soft tissue mass. IMPRESSION: 1. No evidence of osteomyelitis. 2. Focal susceptibility artifact within the plantar soft tissues at the level of the proximal fourth metatarsal, corresponding to a thin linear foreign body seen on recent x-ray. 3. 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 Electronically Signed   By: Titus Dubin M.D.   On: 08/26/2019 17:29   PERIPHERAL VASCULAR CATHETERIZATION  Result Date: 08/22/2019 Patient name: TERRIONNA BRIDWELL MRN: [id number] DOB: 1945/10/05 Sex: female 08/22/2019 Pre-operative Diagnosis: Bilateral lower extremity ulcer Post-operative diagnosis:  Same Surgeon:  [name] Procedure Performed:  1.  Ultrasound-guided access, right femoral artery  2.  Ultrasound-guided access, left femoral artery  3.  Abdominal aortogram  4.  Bilateral lower extremity runoff  5.  Intravascular lithotripsy, right superficial femoral and popliteal artery  6.  Drug-eluting stent, right superficial femoral-popliteal artery  7.  Conscious sedation, 143 minutes Indications: The patient has extensive bilateral lower extremity  ulcers.  She is here today for limb salvage. Procedure:  The patient was identified in the holding area and taken to room 8.  The patient was then placed supine on the table and prepped and draped in the usual sterile fashion.  A time out was called.  Conscious sedation was administered with the use of IV fentanyl and Versed under continuous physician and nurse monitoring.  Heart rate, blood pressure, and oxygen saturation were continuously monitored.  Total sedation time was 143 minutes.  I initially tried to get access into the left common femoral artery, however I was unsuccessful therefore I turned my attention towards the right femoral artery.  Ultrasound was used to evaluate the right common femoral artery.  It was patent .  A digital ultrasound image was acquired.  A micropuncture needle was used to access the right common femoral artery under ultrasound guidance.  An 018 wire was advanced without resistance and a micropuncture sheath was placed.  The 018 wire was removed and a benson wire was placed.  The micropuncture sheath was exchanged for a 5 french sheath.  An omniflush catheter was advanced over the wire to the level of L-1.  An abdominal angiogram was obtained.  Next, the cath was pulled out of the aortic bifurcation and bilateral runoff was performed.  I then followed up for the intervention by gaining access under ultrasound guidance of the left common femoral artery. Findings:  Aortogram: No significant infrarenal aortic stenosis.  Bilateral common and external iliac arteries are patent throughout the course however they are heavily calcified.   Right Lower Extremity:  The right common femoral and profundofemoral artery are heavily calcified but patent throughout their course.  The superficial femoral artery is diffusely diseased with multiple high-grade lesions down to the adductor canal where it occludes.  There is reconstitution of the popliteal artery just below the joint space.  There is  two-vessel runoff via the peroneal and posterior tibial artery.  Left Lower Extremity: The common femoral and profundofemoral artery are patent without stenosis but heavily calcified.  The superficial femoral artery is occluded with reconstitution of the superficial femoral artery at the adductor canal with diffusely diseased three-vessel runoff and minimal opacification of the digital arteries. Intervention: After the above images were acquired the decision made to proceed with intervention.  The sheath in the right groin was removed.  I tried to use a minx however this failed because the balloon popped.  Manual pressure was held for 10 minutes.  I then placed a 7 x 45 sheath from the left groin into the right external iliac artery and fully heparinized the patient.  Using a quick cross catheter and 035 Glidewire followed by a Glidewire advantage I was able to get across the stenosis.  The lesion was predilated with a 4 x 100 balloon.  I then performed shockwave intravascular ultrasound of the popliteal artery just distal to the joint space up to its origin.  A 5 x 60 balloon was used for the distal half and a 6 x 60 balloon was used for the proximal half.  Total treatment time was 510 seconds.  I then elected to stent the treated area.  A 5 x 100 [place] stent was placed distally followed by Elluvia 6 x 120, 7 x 120, and 7 x 40.  The stents were then molded with a 5 mm balloon and completion imaging showed inline flow down across the foot.  Catheters and wires were removed.  A short 7 [place]  sheath was placed.  Patient tolerated the procedure well there were no complications. 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  Successfully recanalized and treated with shockwave intravascular lithotripsy followed by drug-eluting stent placement from just below the joint space in the popliteal artery up to the origin of the superficial femoral artery.  There is two-vessel runoff  via the peroneal and posterior tibial artery.  #2  Left superficial femoral artery occlusion  V. [name], M.D., [facility] [place] Office: [phone] Pager:  [phone]  DG Foot 2 Views Left  Result Date: 08/19/2019 CLINICAL DATA:  Osteomyelitis of the ankle or foot. EXAM: LEFT FOOT - 2 VIEW COMPARISON:  08/18/2019 FINDINGS: Again noted is a foreign body in the medial soft tissues of the left foot. There are degenerative changes of the interphalangeal joints. Advanced vascular calcifications are noted. There is a moderate-sized plantar calcaneal spur. There is no acute displaced fracture or dislocation. There is no definite radiographic evidence for osteomyelitis. IMPRESSION: 1. No definite radiographic evidence for osteomyelitis. 2. Stable foreign body in the medial soft tissues of the left foot. 3. Degenerative changes of the interphalangeal joints. Electronically Signed   By: Constance Holster M.D.   On: 08/19/2019 19:55   DG Foot Complete Right  Result Date: 08/19/2019 CLINICAL DATA:  Osteomyelitis. 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 There is a lucency at the head of the fifth metatarsal. There is some surrounding soft tissue swelling. Vascular calcifications are noted. There are degenerative changes of the midfoot. IMPRESSION: 1. No acute displaced fracture or dislocation. 2. 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 There is a persistent ill-defined lucency at the head of the fifth metatarsal which can be seen in patients with osteomyelitis. Electronically Signed   By: Constance Holster M.D.   On: 08/19/2019 19:59   VAS [place] ABI WITH/WO TBI  Result Date: 08/21/2019 LOWER EXTREMITY DOPPLER STUDY Indications: Gangrene, and peripheral artery disease. High Risk Factors: Diabetes.  Performing Technologist: June Leap Rvt, Rdms   Examination Guidelines: A complete evaluation includes at minimum, Doppler waveform signals and systolic blood pressure reading at the level of bilateral brachial, anterior tibial, and posterior tibial arteries, when vessel segments are accessible. Bilateral testing is considered an integral part of a complete examination. Photoelectric Plethysmograph (PPG) waveforms and toe systolic pressure readings are included as required and additional duplex testing as needed. Limited examinations for reoccurring indications may be performed as noted.  ABI Findings: +---------+-----------------+-----+------------------+-------------------------+ Right    Rt Pressure      IndexWaveform          Comment                            (mmHg)                                                            +---------+-----------------+-----+------------------+-------------------------+ Brachial  BP not taken-restricted                                                    arm                       +---------+-----------------+-----+------------------+-------------------------+ ATA      206              1.02 dampened                                                                   monophasic                                  +---------+-----------------+-----+------------------+-------------------------+ PTA      147              0.73 dampened                                                                   monophasic                                  +---------+-----------------+-----+------------------+-------------------------+ Great Toe                      Abnormal                                    +---------+-----------------+-----+------------------+-------------------------+ +---------+------------------+-----+-------------------+-------+ Left     Lt Pressure (mmHg)IndexWaveform           Comment  +---------+------------------+-----+-------------------+-------+ Brachial 201                    triphasic                  +---------+------------------+-----+-------------------+-------+ ATA      97                0.48 dampened monophasic        +---------+------------------+-----+-------------------+-------+ PTA      19                0.09 dampened monophasic        +---------+------------------+-----+-------------------+-------+ Great Toe                       Abnormal                   +---------+------------------+-----+-------------------+-------+ Great toe PPG waveform too dampened to obtain pressures bilaterally.  Summary: Right: ABI is within normal range by ATA pressure, however abnormal pedal artery waveforms suggest this is falsely elevated. ABI is in the moderate range based on PTA pressure. Severely dampened PPG  waveform of Great toe. Left: Resting left ankle-brachial index indicates severe left lower extremity arterial disease. Severely dampened PPG waveform of Great toe.  *See table(s) above for measurements and observations.  Electronically signed by Servando Snare MD on 08/21/2019 at 5:13:35 PM.    Final     Time Spent in minutes  30   Lala Lund M.D on 08/29/2019 at 11:29 AM  To page go to [email or web address] - password [facility]

## 2019-08-29 NOTE — TOC Progression Note (Signed)
Transition of Care [facility]) - Progression Note    Patient Details  Name: Alexis [name] MRN: [id number] Date of Birth: [birth year]  Transition of Care [facility]) CM/SW [place], [place] Phone Number: 08/29/2019, 12:57 PM  Clinical Narrative:     CSW called patient's [name],provied bed offers. He states he wants to discuss with the patient and then make a decision. CSW will follow up SNF choice.   CSW contacted  CSW HD [place] and advised patient will need to be set up with HD outpatient clinic, once SNF is selected and bed is secured.   Thurmond Butts, MSW, LCSWA Clinical Social Worker   Expected Discharge Plan: Skilled Nursing Facility Barriers to Discharge: Ship broker, Continued Medical Work up, SNF Pending bed offer(patient will need outpatient HD clinic setup w/SNF)  Expected Discharge Plan and Services Expected Discharge Plan: [place] In-house Referral: Clinical Social Work                                             Social Determinants of Health (SDOH) Interventions    Readmission Risk Interventions Readmission Risk Prevention Plan 06/12/2019  Transportation Screening Complete  PCP or Specialist Appt within 3-5 Days Not Complete  HRI or [place] Complete  Social Work Consult for [place] Planning/Counseling Complete  Palliative Care Screening Not Complete  Medication Review Press photographer) Complete  Some recent data might be hidden

## 2019-08-29 NOTE — Progress Notes (Signed)
Pt received from HD. VSS. Call bell in reach. Will continue to monitor.  Bertice Risse R Kyesha Balla, RN  

## 2019-08-29 NOTE — Progress Notes (Signed)
ANTICOAGULATION CONSULT NOTE - Follow Up Consult  Pharmacy Consult for Eliquis Indication: atrial fibrillation  Allergies  Allergen Reactions  . Olmesartan Other (See Comments)    Hyperkalemia     Patient Measurements: Height: 5\' 6"  (167.6 cm) Weight: 90.9 kg (200 lb 6.4 oz) IBW/kg (Calculated) : 59.3  Vital Signs: Temp: 98.7 F (37.1 C) (04/15 1245) Temp Source: Oral (04/15 1245) BP: 118/43 (04/15 1245) Pulse Rate: 76 (04/15 1245)  Labs: Recent Labs    08/27/19 0234 08/27/19 0234 08/27/19 1400 08/28/19 0250 08/29/19 0331 08/29/19 0809  HGB 7.8*   < >  --  7.9* 8.0*  --   HCT 24.6*  --   --  24.5* 24.5*  --   PLT 196  --   --  190 200  --   HEPARINUNFRC 0.30  --   --   --   --   --   CREATININE  --   --  8.69*  --   --  6.45*   < > = values in this interval not displayed.   Assessment:  74 yr old female on Eliquis 2.5 mg BID prior to admission for atrial fibrillation and hx CVA.  Eliquis held for procedures. 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  ASA 81 mg and Plavix 75 mg daily added post-stent.  SQ heparin changed to IV heparin on 4/11 pm, then back to sq heparin on 4/13 due to oozing from stump.  Now to resume Eliquis today.   Hgb 8.0, 1 unit given in HD today.  Goal of Therapy:  appropriate Eliquis dose for indication Monitor platelets by anticoagulation protocol: Yes   Plan:   Discontinue SQ heparin.  Last dose ~6am today.  Eliquis 2.5 mg BID to resume at 6pm tonight.  ASA 81 mg and Plavix 75 mg daily planned for at least a month.  Monitor for increased oozing/ bleeding.   Arty Baumgartner, [place] Phone: [phone] 08/29/2019,12:58 PM

## 2019-08-30 DIAGNOSIS — N186 End stage renal disease: Secondary | ICD-10-CM | POA: Diagnosis not present

## 2019-08-30 DIAGNOSIS — E11621 Type 2 diabetes mellitus with foot ulcer: Secondary | ICD-10-CM | POA: Diagnosis not present

## 2019-08-30 DIAGNOSIS — M869 Osteomyelitis, unspecified: Secondary | ICD-10-CM | POA: Diagnosis not present

## 2019-08-30 DIAGNOSIS — M86172 Other acute osteomyelitis, left ankle and foot: Secondary | ICD-10-CM | POA: Diagnosis not present

## 2019-08-30 LAB — TYPE AND SCREEN
ABO/RH(D): A POS
Antibody Screen: NEGATIVE
Unit division: 0

## 2019-08-30 LAB — BPAM RBC
Blood Product Expiration Date: 202105082359
ISSUE DATE / TIME: 202104151022
Unit Type and Rh: 6200

## 2019-08-30 LAB — GLUCOSE, CAPILLARY
Glucose-Capillary: 100 mg/dL — ABNORMAL HIGH (ref 70–99)
Glucose-Capillary: 110 mg/dL — ABNORMAL HIGH (ref 70–99)
Glucose-Capillary: 114 mg/dL — ABNORMAL HIGH (ref 70–99)
Glucose-Capillary: 111 mg/dL — ABNORMAL HIGH (ref 70–99)

## 2019-08-30 LAB — CBC WITH DIFFERENTIAL/PLATELET
Abs Immature Granulocytes: 0.06 10*3/uL (ref 0.00–0.07)
Basophils Absolute: 0.1 10*3/uL (ref 0.0–0.1)
Basophils Relative: 1 %
Eosinophils Absolute: 0.6 10*3/uL — ABNORMAL HIGH (ref 0.0–0.5)
Eosinophils Relative: 7 %
HCT: 28.4 % — ABNORMAL LOW (ref 36.0–46.0)
Hemoglobin: 9 g/dL — ABNORMAL LOW (ref 12.0–15.0)
Immature Granulocytes: 1 %
Lymphocytes Relative: 15 %
Lymphs Abs: 1.3 10*3/uL (ref 0.7–4.0)
MCH: 25.6 pg — ABNORMAL LOW (ref 26.0–34.0)
MCHC: 31.7 g/dL (ref 30.0–36.0)
MCV: 80.9 fL (ref 80.0–100.0)
Monocytes Absolute: 1.1 10*3/uL — ABNORMAL HIGH (ref 0.1–1.0)
Monocytes Relative: 13 %
Neutro Abs: 5.8 10*3/uL (ref 1.7–7.7)
Neutrophils Relative %: 63 %
Platelets: 228 10*3/uL (ref 150–400)
RBC: 3.51 MIL/uL — ABNORMAL LOW (ref 3.87–5.11)
RDW: 18.2 % — ABNORMAL HIGH (ref 11.5–15.5)
WBC: 9 10*3/uL (ref 4.0–10.5)
nRBC: 0.2 % (ref 0.0–0.2)

## 2019-08-30 LAB — SARS CORONAVIRUS 2 (TAT 6-24 HRS): SARS Coronavirus 2: NEGATIVE

## 2019-08-30 NOTE — TOC Progression Note (Signed)
Transition of Care [facility]) - Progression Note    Patient Details  Name: Alexis [name] MRN: [id number] Date of Birth: December 04, 1945  Transition of Care [facility]) CM/SW [place], [place] Phone Number: 08/30/2019, 3:08 PM  Clinical Narrative:     Received insurance authorization # [id number] reference # [id number] - approval dates 4/15-4/19  Thurmond Butts, MSW, [place] Clinical Social Worker   Expected Discharge Plan: Skilled Nursing Facility Barriers to Discharge: Insurance Authorization, Continued Medical Work up, SNF Pending bed offer(patient will need outpatient HD clinic setup w/SNF)  Expected Discharge Plan and Services Expected Discharge Plan: [place] In-house Referral: Clinical Social Work                                             Social Determinants of Health (SDOH) Interventions    Readmission Risk Interventions Readmission Risk Prevention Plan 06/12/2019  Transportation Screening Complete  PCP or Specialist Appt within 3-5 Days Not Complete  HRI or Home Care Consult Complete  Social Work Consult for [place] Planning/Counseling Complete  Palliative Care Screening Not Complete  Medication Review Press photographer) Complete  Some recent data might be hidden

## 2019-08-30 NOTE — Progress Notes (Signed)
PROGRESS NOTE                                                                                                                                                                                                             Patient Demographics:    Alexis [name], is a 74 y.o. female, DOB - 01/18/1946, SEG:[id number]  Admit date - 08/18/2019   Admitting Physician Shela Leff, MD  Outpatient Primary MD for the patient is System, Provider Not In  LOS - 12  No chief complaint on file.      Brief Narrative  Dalzell a 74 y.o.femalewith medical history significant ofA. fib on Eliquis, ESRD on HD TTS, CAD, pulmonary hypertension, LBBB, hypertension, hyperlipidemia, CHF, type 2 diabetes, diabetic neuropathy, [facility] with complaints of bilateral heel ulcers. Diagnosed with osteomyelitis of the right fifth metatarsal head and transferred to [facility] for further management as she is a dialysis patient.   Subjective:   Patient in bed, appears comfortable, denies any headache, no fever, no chest pain or pressure, no shortness of breath , no abdominal pain. No focal weakness.   Assessment  & Plan :     Diabetic foot ulcers/concern for osteomyelitis: Also neuropathic and vascular ulcers. 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 There was question of distal fibula having signal suggestive of possible infection noted on previous imaging, she has underwent revascularization of the R Leg this admission on 08/22/2019 by vascular surgery and received a drug-eluting stent and currently on dual antiplatelet therapy, right heel ulcer reviewed by Dr. Sharol Given who thinks that there is minimal to no active infection in the right foot and leg and that patient now should be treated with 3 weeks of oral doxycycline which will be started on 08/28/2019.    Continued oozing of blood from the left AKA stump site into  the wound VAC, closely monitor, low threshold to transfuse, consent obtained and type crossmatch done.  Right leg drug-eluting stent placed this admission by vascular surgery.  Case discussed by me with Dr. Trula Slade on 08/27/2019, for now continue dual antiplatelet therapy along with anticoagulation as needed and when able to.   Paroxysmal atrial fibrillation: [place] vas 2 score of at least 3.  Continue amiodarone, will resume Eliquis on 08/29/2019 and monitor for any signs of increased bleeding in the wound VAC, case discussed with Dr. Sharol Given on 08/29/2019.  Anemia of chronic disease.   Some  oozing in the wound VAC of the left AKA stump,  currently she is on dual antiplatelet therapy for the new DES stent in the right leg.  Even though anemia is moderate she was symptomatic while working with PT on 08/28/2019 getting a unit of packed RBC on 08/29/2019 during HD, will monitor with Eliquis resumed on 08/29/2019.  Hypotension.  Chronic.  Increased midodrine dose on 08/28/2019 will monitor.    ESRD on HD: Receiving dialysis on schedule as per nephrology. TTS.  Dementia with hospital-acquired delirium due to unfamiliar setting along with toxic encephalopathy due to infection.  Supportive care, minimize narcotics and benzodiazepines, family made aware that her delirium might get worse, as needed Haldol and nighttime Seroquel.    Insulin-dependent type 2 diabetes: Takes 70/30 insulin at home.  Blood sugars well controlled currently on sliding scale insulin.  Lab Results  Component Value Date   HGBA1C 6.2 (H) 08/18/2019   CBG (last 3)  Recent Labs    08/29/19 1631 08/29/19 2130 08/30/19 0609  GLUCAP 102* 94 100*      Family Communication  :  Daughter [name] [phone], 08/24/19, Husband [name] on [phone], 08/24/19.  Husband bedside on 08/25/2019, 08/27/2019, 08/30/2019  Code Status :  Full  Disposition Plan  : Medically stable, discharge most likely 08/31/2019 if SNF bed  available  Consults  :  Ortho, Renal  Procedures  :    MRI R foot - Non acute  Left AKA on 08/25/2019 by Dr. Sharol Given.    R Leg - revascularization 08/22/2019 - " Intravascular lithotripsy, right superficial femoral and popliteal artery and Drug-eluting stent, right superficial femoral-popliteal artery" Continue to hold anticoagulation for anticipated surgical procedure.  Further management per orthopedics if planning any debridements.   DVT Prophylaxis  :  Eliquis  Lab Results  Component Value Date   PLT 228 08/30/2019    Diet :  Diet Order            Diet renal with fluid restriction Fluid restriction: 1200 mL Fluid; Room service appropriate? No; Fluid consistency: Thin  Diet effective now               Inpatient Medications Scheduled Meds:  amiodarone  200 mg Oral Daily   apixaban  2.5 mg Oral BID   aspirin EC  81 mg Oral Daily   atorvastatin  80 mg Oral Daily   clopidogrel  75 mg Oral Q breakfast   collagenase   Topical Daily   darbepoetin (ARANESP) injection - DIALYSIS  60 mcg Intravenous Q Tue-HD   docusate sodium  100 mg Oral BID   doxercalciferol  3 mcg Intravenous Q T,Th,Sa-HD   doxycycline  100 mg Oral Q12H   feeding supplement (NEPRO CARB STEADY)  237 mL Oral TID BM   feeding supplement (PRO-STAT SUGAR FREE 64)  30 mL Oral BID   ferric citrate  420 mg Oral TID WC   insulin aspart  0-5 Units Subcutaneous QHS   insulin aspart  0-9 Units Subcutaneous TID WC   midodrine  10 mg Oral TID WC   multivitamin  1 tablet Oral QHS   pantoprazole  40 mg Oral Daily   QUEtiapine  12.5 mg Oral QHS   Continuous Infusions:  sodium chloride     PRN Meds:.acetaminophen, albuterol, bisacodyl, haloperidol lactate, labetalol, magnesium citrate, nitroGLYCERIN, ondansetron (ZOFRAN) IV, oxyCODONE, polyethylene glycol  Antibiotics  :   Anti-infectives (From admission, onward)   Start     Dose/Rate Route Frequency  Ordered Stop   08/29/19 1000  doxycycline  (VIBRA-TABS) tablet 100 mg     100 mg Oral Every 12 hours 08/28/19 1032 09/19/19 0959   08/27/19 1800  ceFEPIme (MAXIPIME) 2 g in sodium chloride 0.9 % 100 mL IVPB  Status:  Discontinued     2 g 200 mL/hr over 30 Minutes Intravenous Every T-Th-Sa (1800) 08/27/19 1343 08/28/19 1032   08/27/19 1345  ceFEPIme (MAXIPIME) 1 g in sodium chloride 0.9 % 100 mL IVPB  Status:  Discontinued     1 g 200 mL/hr over 30 Minutes Intravenous Every 24 hours 08/27/19 1338 08/27/19 1343   08/27/19 1345  vancomycin (VANCOCIN) IVPB 1000 mg/200 mL premix  Status:  Discontinued     1,000 mg 200 mL/hr over 60 Minutes Intravenous Every T-Th-Sa (Hemodialysis) 08/27/19 1338 08/28/19 1032   08/27/19 1200  vancomycin (VANCOCIN) IVPB 1000 mg/200 mL premix  Status:  Discontinued     1,000 mg 200 mL/hr over 60 Minutes Intravenous Every T-Th-Sa (Hemodialysis) 08/24/19 1135 08/26/19 1417   08/27/19 1200  vancomycin (VANCOCIN) IVPB 1000 mg/200 mL premix  Status:  Discontinued     1,000 mg 200 mL/hr over 60 Minutes Intravenous Every T-Th-Sa (Hemodialysis) 08/26/19 1417 08/27/19 1320   08/25/19 0700  ceFAZolin (ANCEF) IVPB 2g/100 mL premix  Status:  Discontinued     2 g 200 mL/hr over 30 Minutes Intravenous On call to O.R. 08/25/19 0645 08/25/19 0945   08/24/19 1200  vancomycin (VANCOCIN) IVPB 750 mg/150 ml premix  Status:  Discontinued     750 mg 150 mL/hr over 60 Minutes Intravenous Every T-Th-Sa (Hemodialysis) 08/24/19 1135 08/26/19 1417   08/22/19 1824  vancomycin (VANCOCIN) 1-5 GM/200ML-% IVPB    Note to Pharmacy: Ronny Bacon  : cabinet override      08/22/19 1824 08/23/19 0629   08/20/19 1200  vancomycin (VANCOCIN) IVPB 1000 mg/200 mL premix  Status:  Discontinued     1,000 mg 200 mL/hr over 60 Minutes Intravenous Every T-Th-Sa (Hemodialysis) 08/19/19 1501 08/24/19 1135   08/19/19 2000  ceFEPIme (MAXIPIME) 1 g in sodium chloride 0.9 % 100 mL IVPB  Status:  Discontinued     1 g 200 mL/hr over 30 Minutes  Intravenous Every 24 hours 08/18/19 2000 08/27/19 1320   08/18/19 2015  ceFEPIme (MAXIPIME) 2 g in sodium chloride 0.9 % 100 mL IVPB     2 g 200 mL/hr over 30 Minutes Intravenous  Once 08/18/19 2000 08/18/19 2139   08/18/19 2015  vancomycin (VANCOREADY) IVPB 500 mg/100 mL     500 mg 100 mL/hr over 60 Minutes Intravenous  Once 08/18/19 2000 08/18/19 2339   08/18/19 2000  vancomycin variable dose per unstable renal function (pharmacist dosing)  Status:  Discontinued      Does not apply See admin instructions 08/18/19 2000 08/19/19 1501          Objective:   Vitals:   08/29/19 1129 08/29/19 1245 08/29/19 2056 08/30/19 0605  BP: (!) 123/45 (!) 118/43 (!) 132/41 (!) 140/48  Pulse: 75 76 72 76  Resp: 16 15 14 14   Temp: 98.5 F (36.9 C) 98.7 F (37.1 C) 97.6 F (36.4 C) 98.3 F (36.8 C)  TempSrc: Oral Oral Oral Oral  SpO2: 97% 96% 98% 97%  Weight:    91.2 kg  Height:        SpO2: 97 % O2 Flow Rate (L/min): 4 L/min  Wt Readings from Last 3 Encounters:  08/30/19 91.2 kg  07/22/19 104.3  kg  06/11/19 98.4 kg    No intake or output data in the 24 hours ending 08/30/19 1204   Physical Exam   Awake Alert, No new F.N deficits, Normal affect [place].AT,PERRAL Supple Neck,No JVD, No cervical lymphadenopathy appriciated.  Symmetrical Chest wall movement, Good air movement bilaterally, CTAB RRR,No Gallops, Rubs or new Murmurs, No Parasternal Heave +ve B.Sounds, Abd Soft, No tenderness, No organomegaly appriciated, No rebound - guarding or rigidity Left AKA stump appears stable,  does have right heel ulcers as well.    Data Review:    Recent Labs  Lab 08/26/19 0245 08/27/19 0234 08/28/19 0250 08/29/19 0331 08/30/19 0255  WBC 11.0* 10.0 8.9 9.4 9.0  HGB 8.2* 7.8* 7.9* 8.0* 9.0*  HCT 25.6* 24.6* 24.5* 24.5* 28.4*  PLT 205 196 190 200 228  MCV 78.5* 79.1* 79.8* 79.5* 80.9  MCH 25.2* 25.1* 25.7* 26.0 25.6*  MCHC 32.0 31.7 32.2 32.7 31.7  RDW 18.8* 18.7* 19.0* 19.0* 18.2*   LYMPHSABS 0.7 1.4 1.3 1.3 1.3  MONOABS 1.2* 1.6* 1.2* 1.2* 1.1*  EOSABS 0.0 0.4 0.4 0.5 0.6*  BASOSABS 0.0 0.1 0.1 0.1 0.1    Recent Labs  Lab 08/24/19 0249 08/24/19 0249 08/25/19 0323 08/25/19 1405 08/26/19 0245 08/27/19 1400 08/29/19 0809  NA 136   < > 137 136 139 135 138  K 3.8   < > 3.5 4.7 4.3 4.2 3.7  CL 97*   < > 100 98 99 96* 99  CO2 24   < > 24 23 25 24 26   GLUCOSE 130*   < > 151* 189* 133* 133* 145*  BUN 25*   < > 32* 36* 24* 42* 26*  CREATININE 6.69*   < > 7.92* 8.71* 6.38* 8.69* 6.45*  CALCIUM 9.0   < > 8.6* 9.0 8.7* 8.5* 9.2  AST 14*  --   --   --   --   --   --   ALT 7  --   --   --   --   --   --   ALKPHOS 48  --   --   --   --   --   --   BILITOT 1.0  --   --   --   --   --   --   ALBUMIN 2.8*  --   --  2.4*  --  2.2* 2.3*  MG 1.9  --  1.8  --   --   --   --   CRP  --   --  11.5*  --  11.5*  --   --   INR  --   --  1.3*  --   --   --   --    < > = values in this interval not displayed.    Recent Labs  Lab 08/25/19 0323 08/26/19 0245  CRP 11.5* 11.5*    ------------------------------------------------------------------------------------------------------------------ No results for input(s): CHOL, HDL, LDLCALC, TRIG, CHOLHDL, LDLDIRECT in the last 72 hours.  Lab Results  Component Value Date   HGBA1C 6.2 (H) 08/18/2019   ------------------------------------------------------------------------------------------------------------------ No results for input(s): TSH, T4TOTAL, T3FREE, THYROIDAB in the last 72 hours.  Invalid input(s): FREET3 ------------------------------------------------------------------------------------------------------------------ No results for input(s): VITAMINB12, FOLATE, FERRITIN, TIBC, IRON, RETICCTPCT in the last 72 hours.  Coagulation profile Recent Labs  Lab 08/25/19 0323  INR 1.3*    No results for input(s): DDIMER in the last 72 hours.  Cardiac Enzymes No results for input(s): CKMB, TROPONINI, MYOGLOBIN  in  the last 168 hours.  Invalid input(s): CK ------------------------------------------------------------------------------------------------------------------    Component Value Date/Time   BNP 1,371.0 (H) 08/13/2017 1147    Micro Results Recent Results (from the past 240 hour(s))  SARS CORONAVIRUS 2 (TAT 6-24 HRS) Nasopharyngeal Nasopharyngeal Swab     Status: None   Collection Time: 08/22/19  4:25 AM   Specimen: Nasopharyngeal Swab  Result Value Ref Range Status   SARS Coronavirus 2 NEGATIVE NEGATIVE Final    Comment: (NOTE) SARS-CoV-2 target nucleic acids are NOT DETECTED. The SARS-CoV-2 RNA is generally detectable in upper and lower respiratory specimens during the acute phase of infection. Negative results do not preclude SARS-CoV-2 infection, do not rule out co-infections with other pathogens, and should not be used as the sole basis for treatment or other patient management decisions. Negative results must be combined with clinical observations, patient history, and epidemiological information. The expected result is Negative. Fact Sheet for Patients: SugarRoll.be Fact Sheet for Healthcare Providers: https://www.woods-mathews.com/ This test is not yet approved or cleared by the [place] FDA and  has been authorized for detection and/or diagnosis of SARS-CoV-2 by FDA under an Emergency Use Authorization (EUA). This EUA will remain  in effect (meaning this test can be used) for the duration of the COVID-19 declaration under Section 56 4(b)(1) of the Act, 21 U.S.C. section 360bbb-3(b)(1), unless the authorization is terminated or revoked sooner. Performed at [facility], [place] [address], [place], [place] [address]     Radiology Reports DG Chest 1 View  Result Date: 08/18/2019 CLINICAL DATA:  [place]. EXAM: CHEST  1 VIEW COMPARISON:  May 29, 2019 FINDINGS: There is no evidence of acute infiltrate, pleural effusion or  pneumothorax. There is stable marked severity enlargement of the cardiac silhouette. Multiple radiopaque surgical clips are seen along the left axilla. Degenerative changes seen throughout the thoracic spine. IMPRESSION: Stable cardiomegaly without evidence of acute or active cardiopulmonary disease. Electronically Signed   By: Virgina Norfolk M.D.   On: 08/18/2019 21:45   DG Ankle Complete Left  Result Date: 08/19/2019 CLINICAL DATA:  Pain and swelling. Osteomyelitis. EXAM: LEFT ANKLE COMPLETE - 3+ VIEW COMPARISON:  None. FINDINGS: There is no acute displaced fracture or dislocation. There is osteopenia which limits detection of nondisplaced fractures. There may be ulceration overlying the medial malleolus without definite evidence for underlying osteomyelitis. There is a moderate-sized Achilles tendon enthesophyte. There is a small plantar calcaneal spur. Advanced vascular calcifications are noted. IMPRESSION: 1. No acute displaced fracture or dislocation. 2. Possible ulceration overlying the medial malleolus without definite evidence for underlying osteomyelitis. 3. Advanced vascular calcifications. Electronically Signed   By: Constance Holster M.D.   On: 08/19/2019 19:56   DG Ankle Complete Right  Result Date: 08/19/2019 CLINICAL DATA:  Osteomyelitis of the ankle and foot. EXAM: RIGHT ANKLE - COMPLETE 3+ VIEW COMPARISON:  February 01, 2013 FINDINGS: There are advanced degenerative changes of the ankle mortise with surrounding soft tissue swelling. There is no acute displaced fracture. No dislocation. Advanced degenerative changes are noted of the midfoot. There is a moderate-sized plantar calcaneal spur. There is an Achilles tendon enthesophyte. Vascular calcifications are noted. There is no radiographic evidence for osteomyelitis. IMPRESSION: 1. No acute osseous abnormality. 2. Advanced degenerative changes are noted of the midfoot and ankle mortise. Electronically Signed   By: Constance Holster M.D.    On: 08/19/2019 19:57   DG Abd 1 View  Result Date: 08/18/2019 CLINICAL DATA:  Vomiting, constipation EXAM: ABDOMEN -  1 VIEW COMPARISON:  05/12/2019 FINDINGS: There is a non obstructive bowel gas pattern. No supine evidence of free air. No organomegaly or suspicious calcification. No acute bony abnormality. Aortoiliac atherosclerosis. IMPRESSION: No acute findings. Electronically Signed   By: Rolm Baptise M.D.   On: 08/18/2019 21:40   MR FOOT RIGHT WO CONTRAST  Result Date: 08/26/2019 CLINICAL DATA:  Right heel ulcer. Prior fifth toe amputation 4 osteomyelitis. EXAM: MRI OF THE RIGHT FOOT WITHOUT CONTRAST TECHNIQUE: Multiplanar, multisequence MR imaging of the foot was performed. No intravenous contrast was administered. Osteomyelitis protocol MRI of the foot was obtained, to include the entire foot and ankle. This protocol uses a large field of view to cover the entire foot and ankle, and is suitable for assessing bony structures for osteomyelitis. Due to the large field of view and imaging plane choice, this protocol is less sensitive for assessing small structures such as ligamentous structures of the foot and ankle, compared to a dedicated forefoot or dedicated hindfoot exam. COMPARISON:  Right foot and ankle x-rays dated August 19, 2019. FINDINGS: Bones/Joint/Cartilage No suspicious marrow signal abnormality. Prior amputation of the fifth toe and metatarsal head. No fracture or dislocation. Advanced degenerative changes of the ankle and midfoot with midfoot collapse. Small tibiotalar joint effusion. Ligaments Collateral ligaments are intact. Muscles and Tendons Flexor, peroneal and extensor compartment tendons are intact. Achilles tendon is intact with mild distal tendinosis. Complete fatty atrophy of the intrinsic foot muscles. Soft tissue Focal susceptibility artifact within the plantar soft tissues at the level of the proximal fourth metatarsal, corresponding to the thin linear foreign body seen on  recent x-ray. Additional small area of susceptibility artifact in the plantar soft tissues at the level of the second intermetatarsal space without evident foreign body seen on recent x-ray. No fluid collection or hematoma. No soft tissue mass. IMPRESSION: 1. No evidence of osteomyelitis. 2. Focal susceptibility artifact within the plantar soft tissues at the level of the proximal fourth metatarsal, corresponding to a thin linear foreign body seen on recent x-ray. 3. 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 Electronically Signed   By: Titus Dubin M.D.   On: 08/26/2019 17:29   PERIPHERAL VASCULAR CATHETERIZATION  Result Date: 08/22/2019 Patient name: CALYPSO HAGARTY MRN: [id number] DOB: 08/25/1945 Sex: female 08/22/2019 Pre-operative Diagnosis: Bilateral lower extremity ulcer Post-operative diagnosis:  Same Surgeon:  [name] Procedure Performed:  1.  Ultrasound-guided access, right femoral artery  2.  Ultrasound-guided access, left femoral artery  3.  Abdominal aortogram  4.  Bilateral lower extremity runoff  5.  Intravascular lithotripsy, right superficial femoral and popliteal artery  6.  Drug-eluting stent, right superficial femoral-popliteal artery  7.  Conscious sedation, 143 minutes Indications: The patient has extensive bilateral lower extremity ulcers.  She is here today for limb salvage. Procedure:  The patient was identified in the holding area and taken to room 8.  The patient was then placed supine on the table and prepped and draped in the usual sterile fashion.  A time out was called.  Conscious sedation was administered with the use of IV fentanyl and Versed under continuous physician and nurse monitoring.  Heart rate, blood pressure, and oxygen saturation were continuously monitored.  Total sedation time was 143 minutes.  I initially tried to get access into the left common femoral artery, however I was unsuccessful therefore I turned my attention towards the right femoral  artery.  Ultrasound was used to evaluate the right common femoral artery.  It  was patent .  A digital ultrasound image was acquired.  A micropuncture needle was used to access the right common femoral artery under ultrasound guidance.  An 018 wire was advanced without resistance and a micropuncture sheath was placed.  The 018 wire was removed and a benson wire was placed.  The micropuncture sheath was exchanged for a 5 french sheath.  An omniflush catheter was advanced over the wire to the level of L-1.  An abdominal angiogram was obtained.  Next, the cath was pulled out of the aortic bifurcation and bilateral runoff was performed.  I then followed up for the intervention by gaining access under ultrasound guidance of the left common femoral artery. Findings:  Aortogram: No significant infrarenal aortic stenosis.  Bilateral common and external iliac arteries are patent throughout the course however they are heavily calcified.   Right Lower Extremity:  The right common femoral and profundofemoral artery are heavily calcified but patent throughout their course.  The superficial femoral artery is diffusely diseased with multiple high-grade lesions down to the adductor canal where it occludes.  There is reconstitution of the popliteal artery just below the joint space.  There is two-vessel runoff via the peroneal and posterior tibial artery.  Left Lower Extremity: The common femoral and profundofemoral artery are patent without stenosis but heavily calcified.  The superficial femoral artery is occluded with reconstitution of the superficial femoral artery at the adductor canal with diffusely diseased three-vessel runoff and minimal opacification of the digital arteries. Intervention: After the above images were acquired the decision made to proceed with intervention.  The sheath in the right groin was removed.  I tried to use a minx however this failed because the balloon popped.  Manual pressure was held for 10  minutes.  I then placed a 7 x 45 sheath from the left groin into the right external iliac artery and fully heparinized the patient.  Using a quick cross catheter and 035 Glidewire followed by a Glidewire advantage I was able to get across the stenosis.  The lesion was predilated with a 4 x 100 balloon.  I then performed shockwave intravascular ultrasound of the popliteal artery just distal to the joint space up to its origin.  A 5 x 60 balloon was used for the distal half and a 6 x 60 balloon was used for the proximal half.  Total treatment time was 510 seconds.  I then elected to stent the treated area.  A 5 x 100 Tigris stent was placed distally followed by Elluvia 6 x 120, 7 x 120, and 7 x 40.  The stents were then molded with a 5 mm balloon and completion imaging showed inline flow down across the foot.  Catheters and wires were removed.  A short 7 French sheath was placed.  Patient tolerated the procedure well there were no complications. 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  Successfully recanalized and treated with shockwave intravascular lithotripsy followed by drug-eluting stent placement from just below the joint space in the popliteal artery up to the origin of the superficial femoral artery.  There is two-vessel runoff via the peroneal and posterior tibial artery.  #2  Left superficial femoral artery occlusion  V. [name], M.D., [facility] [place] Office: [phone] Pager:  [phone]  DG Foot 2 Views Left  Result Date: 08/19/2019 CLINICAL DATA:  Osteomyelitis of the ankle or foot. EXAM: LEFT FOOT - 2 VIEW COMPARISON:  08/18/2019 FINDINGS: Again noted is a foreign body in the medial soft tissues of the left foot. There are degenerative changes of the interphalangeal joints. Advanced vascular calcifications are noted. There is a moderate-sized plantar calcaneal spur. There is no acute displaced fracture or  dislocation. There is no definite radiographic evidence for osteomyelitis. IMPRESSION: 1. No definite radiographic evidence for osteomyelitis. 2. Stable foreign body in the medial soft tissues of the left foot. 3. Degenerative changes of the interphalangeal joints. Electronically Signed   By: Constance Holster M.D.   On: 08/19/2019 19:55   DG Foot Complete Right  Result Date: 08/19/2019 CLINICAL DATA:  Osteomyelitis. 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 There is a lucency at the head of the fifth metatarsal. There is some surrounding soft tissue swelling. Vascular calcifications are noted. There are degenerative changes of the midfoot. IMPRESSION: 1. No acute displaced fracture or dislocation. 2. 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 There is a persistent ill-defined lucency at the head of the fifth metatarsal which can be seen in patients with osteomyelitis. Electronically Signed   By: Constance Holster M.D.   On: 08/19/2019 19:59   VAS [place] ABI WITH/WO TBI  Result Date: 08/21/2019 LOWER EXTREMITY DOPPLER STUDY Indications: Gangrene, and peripheral artery disease. High Risk Factors: Diabetes.  Performing Technologist: June Leap Rvt, Rdms  Examination Guidelines: A complete evaluation includes at minimum, Doppler waveform signals and systolic blood pressure reading at the level of bilateral brachial, anterior tibial, and posterior tibial arteries, when vessel segments are accessible. Bilateral testing is considered an integral part of a complete examination. Photoelectric Plethysmograph (PPG) waveforms and toe systolic pressure readings are included as required and additional duplex testing as needed. Limited examinations for reoccurring indications may be performed as noted.  ABI Findings: +---------+-----------------+-----+------------------+-------------------------+   Right     Rt Pressure       Index Waveform           Comment                               (mmHg)                                                                +---------+-----------------+-----+------------------+-------------------------+  Brachial                                             BP not taken-restricted                                                          arm                        +---------+-----------------+-----+------------------+-------------------------+  ATA       206  1.02  dampened                                                                         monophasic                                    +---------+-----------------+-----+------------------+-------------------------+  PTA       147               0.73  dampened                                                                         monophasic                                    +---------+-----------------+-----+------------------+-------------------------+  Great Toe                         Abnormal                                      +---------+-----------------+-----+------------------+-------------------------+ +---------+------------------+-----+-------------------+-------+  Left      Lt Pressure (mmHg) Index Waveform            Comment  +---------+------------------+-----+-------------------+-------+  Brachial  201                      triphasic                    +---------+------------------+-----+-------------------+-------+  ATA       97                 0.48  dampened monophasic          +---------+------------------+-----+-------------------+-------+  PTA       19                 0.09  dampened monophasic          +---------+------------------+-----+-------------------+-------+  Great Toe                          Abnormal                     +---------+------------------+-----+-------------------+-------+ Great toe PPG waveform too dampened to obtain pressures bilaterally.  Summary: Right: ABI is within  normal range by ATA pressure, however abnormal pedal artery waveforms suggest this is falsely elevated. ABI is in the moderate range based on PTA pressure. Severely dampened PPG waveform of Great toe. Left: Resting left ankle-brachial index indicates severe left lower extremity arterial disease. Severely dampened PPG waveform of Great toe.  *See table(s) above for measurements and observations.  Electronically signed by Servando Snare MD on 08/21/2019 at  5:13:35 PM.    Final     Time Spent in minutes  30   Lala Lund M.D on 08/30/2019 at 12:04 PM  To page go to [email or web address] - password [facility]

## 2019-08-30 NOTE — Discharge Instructions (Addendum)
Follow with Primary MD  in 7 days   Get CBC, BMP checked next visit within 1 week by Primary MD    Activity: As tolerated with Full fall precautions use walker/cane & assistance as needed  Disposition SNF  Diet: Renal diet with 1.2 L fluid restriction per day.  Check CBGs before every meal at bedtime.  Special Instructions: If you have smoked or chewed Tobacco  in the last 2 yrs please stop smoking, stop any regular Alcohol  and or any Recreational drug use.  On your next visit with your primary care physician please Get Medicines reviewed and adjusted.  Please request your Prim.MD to go over all Hospital Tests and Procedure/Radiological results at the follow up, please get all Hospital records sent to your Prim MD by signing hospital release before you go home.  If you experience worsening of your admission symptoms, develop shortness of breath, life threatening emergency, suicidal or homicidal thoughts you must seek medical attention immediately by calling 911 or calling your MD immediately  if symptoms less severe.  You Must read complete instructions/literature along with all the possible adverse reactions/side effects for all the Medicines you take and that have been prescribed to you. Take any new Medicines after you have completely understood and accpet all the possible adverse reactions/side effects.      Information on my medicine - ELIQUIS (apixaban)  Why was Eliquis prescribed for you? Eliquis was prescribed for you to reduce the risk of a blood clot forming that can cause a stroke if you have a medical condition called atrial fibrillation (a type of irregular heartbeat).  What do You need to know about Eliquis ? Take your Eliquis TWICE DAILY - one tablet in the morning and one tablet in the evening with or without food. If you have difficulty swallowing the tablet whole please discuss with your pharmacist how to take the medication safely.  Take Eliquis exactly as  prescribed by your doctor and DO NOT stop taking Eliquis without talking to the doctor who prescribed the medication.  Stopping may increase your risk of developing a stroke.  Refill your prescription before you run out.  After discharge, you should have regular check-up appointments with your healthcare provider that is prescribing your Eliquis.  In the future your dose may need to be changed if your kidney function or weight changes by a significant amount or as you get older.  What do you do if you miss a dose? If you miss a dose, take it as soon as you remember on the same day and resume taking twice daily.  Do not take more than one dose of ELIQUIS at the same time to make up a missed dose.  Important Safety Information A possible side effect of Eliquis is bleeding. You should call your healthcare provider right away if you experience any of the following: ? Bleeding from an injury or your nose that does not stop. ? Unusual colored urine (red or dark brown) or unusual colored stools (red or black). ? Unusual bruising for unknown reasons. ? A serious fall or if you hit your head (even if there is no bleeding).  Some medicines may interact with Eliquis and might increase your risk of bleeding or clotting while on Eliquis. To help avoid this, consult your healthcare provider or pharmacist prior to using any new prescription or non-prescription medications, including herbals, vitamins, non-steroidal anti-inflammatory drugs (NSAIDs) and supplements.  This website has more information on Eliquis (apixaban): http://www.eliquis.com/eliquis/home

## 2019-08-30 NOTE — TOC Progression Note (Addendum)
Transition of Care [facility]) - Progression Note    Patient Details  Name: Alexis [name] MRN: [id number] Date of Birth: August 09, 1945  Transition of Care [facility]) CM/SW [place], [place] Phone Number: 08/30/2019, 11:40 AM  Clinical Narrative:     CSW informed patient's spouse, Kennyth Lose, patient can not discharge to [facility] because we can not secure outpatient dialysis clinic M,W, F as requested by SNF, they do not transport on TTS. He states he understands and was agreeable to [facility] in [place].   CSW contacted [facility]- they confirmed bed offer & they will tranport her to her dialysis center in [place].   CSW contacted Renal Navigator/[place] -informed of change in SNF CSW contacted her insurance -  Updated on change of SNF placement- Patient will need re-authorization for different  SNF choice - auth pending. CSW informed MD -covid needed CSW left voice message with patient's daughter, [name] of SNF placement at [facility].  Thurmond Butts, MSW, LCSWA Clinical Social Worker    Expected Discharge Plan: Skilled Nursing Facility Barriers to Discharge: Ship broker, Continued Medical Work up, SNF Pending bed offer(patient will need outpatient HD clinic setup w/SNF)  Expected Discharge Plan and Services Expected Discharge Plan: [place] In-house Referral: Clinical Social Work                                             Social Determinants of Health (SDOH) Interventions    Readmission Risk Interventions Readmission Risk Prevention Plan 06/12/2019  Transportation Screening Complete  PCP or Specialist Appt within 3-5 Days Not Complete  HRI or [place] Complete  Social Work Consult for [place] Planning/Counseling Complete  Palliative Care Screening Not Complete  Medication Review Press photographer) Complete  Some recent data might be hidden

## 2019-08-30 NOTE — Progress Notes (Signed)
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 HD yesterday 1.5L UF . Hb 8 to 9 this AM after pRBC yesterday . Having leg pain this AM, wound vac removed   04/15 0701 - 04/16 0700 In: 26.7 [Blood:26.7] Out: 1500   Filed Weights   08/28/19 0541 08/29/19 0750 08/30/19 0605  Weight: 87.9 kg 90.9 kg 91.2 kg    Scheduled Meds: . amiodarone  200 mg Oral Daily  . apixaban  2.5 mg Oral BID  . aspirin EC  81 mg Oral Daily  . atorvastatin  80 mg Oral Daily  . clopidogrel  75 mg Oral Q breakfast  . collagenase   Topical Daily  . darbepoetin (ARANESP) injection - DIALYSIS  60 mcg Intravenous Q Tue-HD  . docusate sodium  100 mg Oral BID  . doxercalciferol  3 mcg Intravenous Q T,Th,Sa-HD  . doxycycline  100 mg Oral Q12H  . feeding supplement (NEPRO CARB STEADY)  237 mL Oral TID BM  . feeding supplement (PRO-STAT SUGAR FREE 64)  30 mL Oral BID  . ferric citrate  420 mg Oral TID WC  . insulin aspart  0-5 Units Subcutaneous QHS  . insulin aspart  0-9 Units Subcutaneous TID WC  . midodrine  10 mg Oral TID WC  . multivitamin  1 tablet Oral QHS  . pantoprazole  40 mg Oral Daily  . QUEtiapine  12.5 mg Oral QHS   Continuous Infusions: . sodium chloride     PRN Meds:.acetaminophen, albuterol, bisacodyl, haloperidol lactate, labetalol, magnesium citrate, nitroGLYCERIN, ondansetron (ZOFRAN) IV, oxyCODONE, polyethylene glycol  Current Labs: reviewed    Physical Exam:  Blood pressure (!) 140/48, pulse 76, temperature 98.3 F (36.8 C), temperature source Oral, resp. rate 14, height 5\' 6"  (1.676 m), weight 91.2 kg, SpO2 97 %. Mild agitation LLE AKA stump c/d/i RUE AVF +B/T Regular,nl s1s2 CTAB  Dialysis Orders: [facility] T,Th,S 4.25 hr 400/600 96 kg 2.0 K/2.5 Ca R AVG -Heparin 2000 units IV initial bolus then 1000 units/hr. Stop 1 hour before end of treatment -Epogen 5400 units IV TIW -Hectorol 2.5 mcg IV  TIW -Venofer 50 mg IV weekly   A 1. ESRD THS [facility] RUE AVG 2. 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 ABLA, Anemia of CKD: Hb 9, on ESA 4. CKD-BMD: stable, on VDRA, on binders 5. DM 6. AFib 7. Hx/o CVA  P . Cont HD on THS schedule tomorrow: 3K, 1-2L UF, AVG, no heparin, 3.5h, 400/600  . Medication Issues; o Preferred narcotic agents for pain control are hydromorphone, fentanyl, and methadone. Morphine should not be used.  o Baclofen should be avoided o Avoid oral sodium phosphate and magnesium citrate based laxatives / bowel preps    Pearson Grippe MD 08/30/2019, 10:33 AM  Recent Labs  Lab 08/25/19 1405 08/25/19 1405 08/26/19 0245 08/27/19 1400 08/29/19 0809  NA 136   < > 139 135 138  K 4.7   < > 4.3 4.2 3.7  CL 98   < > 99 96* 99  CO2 23   < > 25 24 26   GLUCOSE 189*   < > 133* 133* 145*  BUN 36*   < > 24* 42* 26*  CREATININE 8.71*   < > 6.38* 8.69* 6.45*  CALCIUM 9.0   < > 8.7* 8.5* 9.2  PHOS 5.5*  --   --  4.7* 4.1   < > =  values in this interval not displayed.   Recent Labs  Lab 08/28/19 0250 08/29/19 0331 08/30/19 0255  WBC 8.9 9.4 9.0  NEUTROABS 5.9 6.3 5.8  HGB 7.9* 8.0* 9.0*  HCT 24.5* 24.5* 28.4*  MCV 79.8* 79.5* 80.9  PLT 190 200 228

## 2019-08-30 NOTE — Progress Notes (Signed)
Morning meds crushed and mixed with nepro, pt refuses to eat apple sauce.  Also added 5mg  oxycodone, crushed, as pt c/o pain.  Pt ate a couple of spoonfuls of the mixture and refused the rest.  Was able to administer approximately half of all crushed meds, at most.

## 2019-08-30 NOTE — Progress Notes (Signed)
Renal Navigator received call from CSW/C. Wynetta Emery that patient has chosen [facility] [employer] and needs a MWF HD seat in a clinic in [place]. Navigator explained that this could be difficult, given that this patient is [facility] patient and that if she were to go to a SNF in [place], she would not have to change Nephrology groups. However, Renal Navigator evaluated availability to accommodate this request, since family is requesting [employer]. Per Admissions, [facility] is the only clinic that has an open MWF seat. Navigator discussed with [facility] Manager/Jolene, who states the MWF seat is not available and that patient would have to have a TTS schedule, if Medical Director accepted the referral. Navigator relayed this message to [place] that unfortunately, [facility] will not be an option for her for SNF.  CSW reported back to Renal Navigator that family has now chosen [facility] in [place]. I believe that [facility] will transport to [facility], which CSW will confirm. Navigator will contact [facility] in [place], if [facility] requires patient to have HD at that facility.  Alphonzo Cruise, [place] Renal Navigator [phone]

## 2019-08-30 NOTE — Progress Notes (Signed)
S/P Left AKA. In bed with husband at bedside.  Wound vac not functioning dressing has lost seal. Wound vac removed. Well opposed wound edges no necrosis or dehiscence No cellulitis. Dressing applied  Will order daily dressing changes. Will need to follow up with Dr. Sharol Given 7-10 days after discharge.

## 2019-08-30 NOTE — TOC Progression Note (Signed)
Transition of Care [facility]) - Progression Note    Patient Details  Name: Alexis [name] MRN: [id number] Date of Birth: Jul 02, 1945  Transition of Care [facility]) CM/SW [place], [place] Phone Number: 08/30/2019, 9:57 AM  Clinical Narrative:     Received insurance authorization for 5 days- reference # [id number], from 04/16-04/19.   [facility] Has accepted  HD Navigator/ CSW [place]in [place] - working on setting up/ getting approval for Out Patient HD clinic.  She will inform CSW once clinic has been secured.   Thurmond Butts, MSW, LCSWA Clinical Social Worker   Expected Discharge Plan: Skilled Nursing Facility Barriers to Discharge: Ship broker, Continued Medical Work up, SNF Pending bed offer(patient will need outpatient HD clinic setup w/SNF)  Expected Discharge Plan and Services Expected Discharge Plan: [place] In-house Referral: Clinical Social Work                                             Social Determinants of Health (SDOH) Interventions    Readmission Risk Interventions Readmission Risk Prevention Plan 06/12/2019  Transportation Screening Complete  PCP or Specialist Appt within 3-5 Days Not Complete  HRI or [place] Complete  Social Work Consult for [place] Planning/Counseling Complete  Palliative Care Screening Not Complete  Medication Review Press photographer) Complete  Some recent data might be hidden
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
## 2019-08-31 DIAGNOSIS — Z853 Personal history of malignant neoplasm of breast: Secondary | ICD-10-CM | POA: Diagnosis not present

## 2019-08-31 DIAGNOSIS — R4182 Altered mental status, unspecified: Secondary | ICD-10-CM | POA: Diagnosis present

## 2019-08-31 DIAGNOSIS — Z03818 Encounter for observation for suspected exposure to other biological agents ruled out: Secondary | ICD-10-CM | POA: Diagnosis not present

## 2019-08-31 DIAGNOSIS — Z992 Dependence on renal dialysis: Secondary | ICD-10-CM | POA: Diagnosis not present

## 2019-08-31 DIAGNOSIS — I9589 Other hypotension: Secondary | ICD-10-CM | POA: Diagnosis not present

## 2019-08-31 DIAGNOSIS — K449 Diaphragmatic hernia without obstruction or gangrene: Secondary | ICD-10-CM | POA: Diagnosis not present

## 2019-08-31 DIAGNOSIS — M2550 Pain in unspecified joint: Secondary | ICD-10-CM | POA: Diagnosis not present

## 2019-08-31 DIAGNOSIS — E1140 Type 2 diabetes mellitus with diabetic neuropathy, unspecified: Secondary | ICD-10-CM | POA: Diagnosis not present

## 2019-08-31 DIAGNOSIS — I480 Paroxysmal atrial fibrillation: Secondary | ICD-10-CM | POA: Diagnosis not present

## 2019-08-31 DIAGNOSIS — R5381 Other malaise: Secondary | ICD-10-CM | POA: Diagnosis not present

## 2019-08-31 DIAGNOSIS — I509 Heart failure, unspecified: Secondary | ICD-10-CM | POA: Diagnosis not present

## 2019-08-31 DIAGNOSIS — R05 Cough: Secondary | ICD-10-CM | POA: Diagnosis not present

## 2019-08-31 DIAGNOSIS — K921 Melena: Secondary | ICD-10-CM | POA: Diagnosis not present

## 2019-08-31 DIAGNOSIS — I209 Angina pectoris, unspecified: Secondary | ICD-10-CM | POA: Diagnosis not present

## 2019-08-31 DIAGNOSIS — I4891 Unspecified atrial fibrillation: Secondary | ICD-10-CM | POA: Diagnosis not present

## 2019-08-31 DIAGNOSIS — I132 Hypertensive heart and chronic kidney disease with heart failure and with stage 5 chronic kidney disease, or end stage renal disease: Secondary | ICD-10-CM | POA: Diagnosis not present

## 2019-08-31 DIAGNOSIS — E162 Hypoglycemia, unspecified: Secondary | ICD-10-CM | POA: Diagnosis not present

## 2019-08-31 DIAGNOSIS — G9341 Metabolic encephalopathy: Secondary | ICD-10-CM | POA: Diagnosis not present

## 2019-08-31 DIAGNOSIS — I959 Hypotension, unspecified: Secondary | ICD-10-CM | POA: Diagnosis not present

## 2019-08-31 DIAGNOSIS — Z515 Encounter for palliative care: Secondary | ICD-10-CM | POA: Diagnosis not present

## 2019-08-31 DIAGNOSIS — Z89612 Acquired absence of left leg above knee: Secondary | ICD-10-CM | POA: Diagnosis not present

## 2019-08-31 DIAGNOSIS — Z7689 Persons encountering health services in other specified circumstances: Secondary | ICD-10-CM | POA: Diagnosis not present

## 2019-08-31 DIAGNOSIS — I482 Chronic atrial fibrillation, unspecified: Secondary | ICD-10-CM | POA: Diagnosis not present

## 2019-08-31 DIAGNOSIS — Z7901 Long term (current) use of anticoagulants: Secondary | ICD-10-CM | POA: Diagnosis not present

## 2019-08-31 DIAGNOSIS — M160 Bilateral primary osteoarthritis of hip: Secondary | ICD-10-CM | POA: Diagnosis not present

## 2019-08-31 DIAGNOSIS — F411 Generalized anxiety disorder: Secondary | ICD-10-CM | POA: Diagnosis not present

## 2019-08-31 DIAGNOSIS — K228 Other specified diseases of esophagus: Secondary | ICD-10-CM | POA: Diagnosis not present

## 2019-08-31 DIAGNOSIS — I2510 Atherosclerotic heart disease of native coronary artery without angina pectoris: Secondary | ICD-10-CM | POA: Diagnosis not present

## 2019-08-31 DIAGNOSIS — E1122 Type 2 diabetes mellitus with diabetic chronic kidney disease: Secondary | ICD-10-CM | POA: Diagnosis not present

## 2019-08-31 DIAGNOSIS — N2581 Secondary hyperparathyroidism of renal origin: Secondary | ICD-10-CM | POA: Diagnosis not present

## 2019-08-31 DIAGNOSIS — K922 Gastrointestinal hemorrhage, unspecified: Secondary | ICD-10-CM | POA: Diagnosis not present

## 2019-08-31 DIAGNOSIS — L97314 Non-pressure chronic ulcer of right ankle with necrosis of bone: Secondary | ICD-10-CM | POA: Diagnosis not present

## 2019-08-31 DIAGNOSIS — R2689 Other abnormalities of gait and mobility: Secondary | ICD-10-CM | POA: Diagnosis not present

## 2019-08-31 DIAGNOSIS — I739 Peripheral vascular disease, unspecified: Secondary | ICD-10-CM | POA: Diagnosis not present

## 2019-08-31 DIAGNOSIS — I6930 Unspecified sequelae of cerebral infarction: Secondary | ICD-10-CM | POA: Diagnosis not present

## 2019-08-31 DIAGNOSIS — D631 Anemia in chronic kidney disease: Secondary | ICD-10-CM | POA: Diagnosis not present

## 2019-08-31 DIAGNOSIS — K3189 Other diseases of stomach and duodenum: Secondary | ICD-10-CM | POA: Diagnosis not present

## 2019-08-31 DIAGNOSIS — Z7189 Other specified counseling: Secondary | ICD-10-CM | POA: Diagnosis not present

## 2019-08-31 DIAGNOSIS — K219 Gastro-esophageal reflux disease without esophagitis: Secondary | ICD-10-CM | POA: Diagnosis not present

## 2019-08-31 DIAGNOSIS — R410 Disorientation, unspecified: Secondary | ICD-10-CM | POA: Diagnosis not present

## 2019-08-31 DIAGNOSIS — L89514 Pressure ulcer of right ankle, stage 4: Secondary | ICD-10-CM | POA: Diagnosis not present

## 2019-08-31 DIAGNOSIS — L89313 Pressure ulcer of right buttock, stage 3: Secondary | ICD-10-CM | POA: Diagnosis not present

## 2019-08-31 DIAGNOSIS — I272 Pulmonary hypertension, unspecified: Secondary | ICD-10-CM | POA: Diagnosis not present

## 2019-08-31 DIAGNOSIS — I5030 Unspecified diastolic (congestive) heart failure: Secondary | ICD-10-CM | POA: Diagnosis not present

## 2019-08-31 DIAGNOSIS — I10 Essential (primary) hypertension: Secondary | ICD-10-CM | POA: Diagnosis not present

## 2019-08-31 DIAGNOSIS — E1151 Type 2 diabetes mellitus with diabetic peripheral angiopathy without gangrene: Secondary | ICD-10-CM | POA: Diagnosis not present

## 2019-08-31 DIAGNOSIS — L89893 Pressure ulcer of other site, stage 3: Secondary | ICD-10-CM | POA: Diagnosis not present

## 2019-08-31 DIAGNOSIS — K31819 Angiodysplasia of stomach and duodenum without bleeding: Secondary | ICD-10-CM | POA: Diagnosis not present

## 2019-08-31 DIAGNOSIS — K2100 Gastro-esophageal reflux disease with esophagitis, without bleeding: Secondary | ICD-10-CM | POA: Diagnosis not present

## 2019-08-31 DIAGNOSIS — D464 Refractory anemia, unspecified: Secondary | ICD-10-CM | POA: Diagnosis not present

## 2019-08-31 DIAGNOSIS — K5521 Angiodysplasia of colon with hemorrhage: Secondary | ICD-10-CM | POA: Diagnosis not present

## 2019-08-31 DIAGNOSIS — D649 Anemia, unspecified: Secondary | ICD-10-CM | POA: Diagnosis not present

## 2019-08-31 DIAGNOSIS — M86472 Chronic osteomyelitis with draining sinus, left ankle and foot: Secondary | ICD-10-CM

## 2019-08-31 DIAGNOSIS — I517 Cardiomegaly: Secondary | ICD-10-CM | POA: Diagnosis not present

## 2019-08-31 DIAGNOSIS — E118 Type 2 diabetes mellitus with unspecified complications: Secondary | ICD-10-CM | POA: Diagnosis not present

## 2019-08-31 DIAGNOSIS — I69328 Other speech and language deficits following cerebral infarction: Secondary | ICD-10-CM | POA: Diagnosis not present

## 2019-08-31 DIAGNOSIS — Z4781 Encounter for orthopedic aftercare following surgical amputation: Secondary | ICD-10-CM | POA: Diagnosis not present

## 2019-08-31 DIAGNOSIS — I447 Left bundle-branch block, unspecified: Secondary | ICD-10-CM | POA: Diagnosis not present

## 2019-08-31 DIAGNOSIS — Z20822 Contact with and (suspected) exposure to covid-19: Secondary | ICD-10-CM | POA: Diagnosis not present

## 2019-08-31 DIAGNOSIS — M6281 Muscle weakness (generalized): Secondary | ICD-10-CM | POA: Diagnosis not present

## 2019-08-31 DIAGNOSIS — E11649 Type 2 diabetes mellitus with hypoglycemia without coma: Secondary | ICD-10-CM | POA: Diagnosis not present

## 2019-08-31 DIAGNOSIS — K317 Polyp of stomach and duodenum: Secondary | ICD-10-CM | POA: Diagnosis not present

## 2019-08-31 DIAGNOSIS — I5042 Chronic combined systolic (congestive) and diastolic (congestive) heart failure: Secondary | ICD-10-CM | POA: Diagnosis not present

## 2019-08-31 DIAGNOSIS — M86172 Other acute osteomyelitis, left ankle and foot: Secondary | ICD-10-CM | POA: Diagnosis not present

## 2019-08-31 DIAGNOSIS — Z7401 Bed confinement status: Secondary | ICD-10-CM | POA: Diagnosis not present

## 2019-08-31 DIAGNOSIS — L89323 Pressure ulcer of left buttock, stage 3: Secondary | ICD-10-CM | POA: Diagnosis not present

## 2019-08-31 DIAGNOSIS — L89322 Pressure ulcer of left buttock, stage 2: Secondary | ICD-10-CM | POA: Diagnosis not present

## 2019-08-31 DIAGNOSIS — N186 End stage renal disease: Secondary | ICD-10-CM | POA: Diagnosis not present

## 2019-08-31 DIAGNOSIS — E785 Hyperlipidemia, unspecified: Secondary | ICD-10-CM | POA: Diagnosis not present

## 2019-08-31 LAB — RENAL FUNCTION PANEL
Albumin: 2.4 g/dL — ABNORMAL LOW (ref 3.5–5.0)
Anion gap: 14 (ref 5–15)
BUN: 21 mg/dL (ref 8–23)
CO2: 26 mmol/L (ref 22–32)
Calcium: 9.1 mg/dL (ref 8.9–10.3)
Chloride: 96 mmol/L — ABNORMAL LOW (ref 98–111)
Creatinine, Ser: 6.45 mg/dL — ABNORMAL HIGH (ref 0.44–1.00)
GFR calc Af Amer: 7 mL/min — ABNORMAL LOW (ref >60)
GFR calc non Af Amer: 6 mL/min — ABNORMAL LOW (ref >60)
Glucose, Bld: 106 mg/dL — ABNORMAL HIGH (ref 70–99)
Phosphorus: 4.5 mg/dL (ref 2.5–4.6)
Potassium: 3.5 mmol/L (ref 3.5–5.1)
Sodium: 136 mmol/L (ref 135–145)

## 2019-08-31 LAB — CBC WITH DIFFERENTIAL/PLATELET
Abs Immature Granulocytes: 0.05 10*3/uL (ref 0.00–0.07)
Basophils Absolute: 0.1 10*3/uL (ref 0.0–0.1)
Basophils Relative: 1 %
Eosinophils Absolute: 0.6 10*3/uL — ABNORMAL HIGH (ref 0.0–0.5)
Eosinophils Relative: 6 %
HCT: 29.2 % — ABNORMAL LOW (ref 36.0–46.0)
Hemoglobin: 9.3 g/dL — ABNORMAL LOW (ref 12.0–15.0)
Immature Granulocytes: 1 %
Lymphocytes Relative: 14 %
Lymphs Abs: 1.3 10*3/uL (ref 0.7–4.0)
MCH: 25.7 pg — ABNORMAL LOW (ref 26.0–34.0)
MCHC: 31.8 g/dL (ref 30.0–36.0)
MCV: 80.7 fL (ref 80.0–100.0)
Monocytes Absolute: 1.1 10*3/uL — ABNORMAL HIGH (ref 0.1–1.0)
Monocytes Relative: 12 %
Neutro Abs: 6.1 10*3/uL (ref 1.7–7.7)
Neutrophils Relative %: 66 %
Platelets: 232 10*3/uL (ref 150–400)
RBC: 3.62 MIL/uL — ABNORMAL LOW (ref 3.87–5.11)
RDW: 18.9 % — ABNORMAL HIGH (ref 11.5–15.5)
WBC: 9.2 10*3/uL (ref 4.0–10.5)
nRBC: 0 % (ref 0.0–0.2)

## 2019-08-31 LAB — GLUCOSE, CAPILLARY
Glucose-Capillary: 113 mg/dL — ABNORMAL HIGH (ref 70–99)
Glucose-Capillary: 91 mg/dL (ref 70–99)

## 2019-08-31 MED ORDER — CLOPIDOGREL BISULFATE 75 MG PO TABS: 75.0000 mg | ORAL_TABLET | Freq: Every day | ORAL | Status: AC

## 2019-08-31 MED ORDER — PANTOPRAZOLE SODIUM 40 MG PO TBEC: 40.0000 mg | DELAYED_RELEASE_TABLET | Freq: Every day | ORAL | Status: DC

## 2019-08-31 MED ORDER — INSULIN ASPART 100 UNIT/ML ~~LOC~~ SOLN: SUBCUTANEOUS | Status: DC

## 2019-08-31 MED ORDER — RENA-VITE PO TABS: 1.0000 | ORAL_TABLET | Freq: Every day | ORAL | 0 refills | Status: AC

## 2019-08-31 MED ORDER — OXYCODONE HCL 5 MG PO TABS: 5.0000 mg | ORAL_TABLET | Freq: Four times a day (QID) | ORAL | 0 refills | Status: DC | PRN

## 2019-08-31 MED ORDER — DOXYCYCLINE HYCLATE 100 MG PO TABS: 100.0000 mg | ORAL_TABLET | Freq: Two times a day (BID) | ORAL | Status: DC

## 2019-08-31 MED ORDER — ASPIRIN 81 MG PO TBEC: 81.0000 mg | DELAYED_RELEASE_TABLET | Freq: Every day | ORAL | Status: DC

## 2019-08-31 NOTE — Discharge Summary (Signed)
Alexis [name] QVZ:[id number] DOB: 11-27-1945 DOA: 08/18/2019  PCP: System, Provider Not In  Admit date: 08/18/2019  Discharge date: 08/31/2019  Admitted From: Home   Disposition:  Home   Recommendations for Outpatient Follow-up:   Follow up with PCP in 1-2 weeks  PCP Please obtain BMP/CBC, 2 view CXR in 1week,  (see Discharge instructions)   PCP Please follow up on the following pending results: [place]: None   Equipment/Devices: None  Consultations: Ortho, VVS, Renal Discharge Condition: Stable    CODE STATUS: Full    Diet Recommendation:  Diet Order            Diet renal with fluid restriction Fluid restriction: 1200 mL Fluid; Room service appropriate? No; Fluid consistency: Thin  Diet effective now               CC - Fever  Brief history of present illness from the day of admission and additional interim summary    Alexis H Jacksonis a 74 y.o.femalewith medical history significant ofA. fib on Eliquis, ESRD on HD TTS, CAD, pulmonary hypertension, LBBB, hypertension, hyperlipidemia, CHF, type 2 diabetes, diabetic neuropathy, [facility] complaints of bilateral heel ulcers. Diagnosed with osteomyelitis of the right fifth metatarsal head and transferred to [facility] for further management as she is a dialysis patient.                                                                 Hospital Course     Diabetic foot ulcers/concern for osteomyelitis: Also neuropathic and vascular ulcers. 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 There was question of distal fibula having signal suggestive of possible infection noted on previous imaging, she has underwent revascularization of the R Leg this admission on  08/22/2019 by vascular surgery and received a drug-eluting stent and currently on dual antiplatelet therapy, right heel ulcer reviewed by Dr. Sharol Given who thinks that there is minimal to no active infection in the right foot and leg and that patient now should be treated with 3 weeks of oral doxycycline which was started on 08/28/2019 with stop date of Sep 18, 2019.    You must follow with Dr. Sharol Given in 1 week post discharge.  Wound VAC on the right AKA stump was removed on 08/30/2019.    Right leg drug-eluting stent placed this admission by vascular surgery.  Case discussed by me with Dr. Trula Slade on 08/27/2019, for now continue dual antiplatelet therapy along with anticoagulation .   Paroxysmal atrial fibrillation: [place] vas 2 score of at least 3. Continue amiodarone, will resume Eliquis on 08/29/2019 .  Anemia of chronic disease.  Some oozing in the wound VAC of the left AKA stump,  currently she  is on dual antiplatelet therapy for the new DES stent in the right leg.  Even though anemia is moderate she was symptomatic while working with PT on 08/28/2019 getting a unit of packed RBC on 08/29/2019 during HD, will monitor with Eliquis resumed on 08/29/2019.  Hypotension.  Chronic.    Stable on midodrine.    ESRD on HD: Receiving dialysis on schedule as per nephrology. TTS.  She  is being dialyzed today prior to discharge.  Dementia with hospital-acquired delirium due to unfamiliar setting along with toxic encephalopathy due to infection.  Supportive care, minimize narcotics and benzodiazepines, family made aware that her delirium might get worse, continue supportive care.   Insulin-dependent type 2 diabetes: Continue home regimen continue to check CBGs before every meal at bedtime at SNF and adjust as needed.  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  Check CBGs before every meal at bedtime.  Special Instructions: If you have smoked or chewed Tobacco  in the last 2 yrs please stop smoking, stop any regular Alcohol  and or any Recreational drug use.  On your next visit with your primary care physician please Get Medicines reviewed and adjusted.  Please request your Prim.MD to go over all Hospital Tests and Procedure/Radiological results at the follow up, please get all Hospital records sent to your Prim MD by signing hospital release before you go home.  If you experience worsening of your admission symptoms, develop shortness of breath, life threatening emergency, suicidal or homicidal thoughts you must seek medical attention immediately by calling 911 or calling your MD immediately  if symptoms less severe.  You Must read complete instructions/literature along with all the possible adverse reactions/side effects for all the Medicines you take and that have been prescribed to you. Take any new Medicines after you have completely understood and accpet all the possible adverse reactions/side effects.   Increase activity slowly   Complete by: As directed       Discharge Medications   Allergies as of 08/31/2019      Reactions   Olmesartan Other (See Comments)   Hyperkalemia      Medication List    TAKE these medications    albuterol 108 (90 Base) MCG/ACT inhaler Commonly known as: VENTOLIN HFA Inhale 1-2 puffs into the lungs every 6 (six) hours as needed for wheezing or shortness of breath.   amiodarone 200 MG tablet Commonly known as: PACERONE Take 1 tablet (200 mg total) by mouth daily. What changed: additional instructions   apixaban 2.5 MG Tabs tablet Commonly known as: ELIQUIS Take 1 tablet (2.5 mg total) by mouth 2 (two)  times daily. What changed: additional instructions   aspirin 81 MG EC tablet Take 1 tablet (81 mg total) by mouth daily.   atorvastatin 80 MG tablet Commonly known as: LIPITOR Take 1 tablet (80 mg total) by mouth daily.   calcitRIOL 0.25 MCG capsule Commonly known as: ROCALTROL Take 0.25 mcg by mouth Every Tuesday,Thursday,and Saturday with dialysis. (1700)   clopidogrel 75 MG tablet Commonly known as: PLAVIX Take 1 tablet (75 mg total) by mouth daily with breakfast.   diclofenac Sodium 1 % Gel Commonly known as: VOLTAREN Apply 2 g topically 4 (four) times daily as needed for pain.   docusate sodium 100 MG capsule Commonly known as: COLACE Take 100 mg by mouth every other day. Hold for loose stools   doxycycline 100 MG tablet Commonly known as: VIBRA-TABS Take 1 tablet (100 mg total) by mouth every 12 (twelve) hours for 19 days.   insulin aspart 100 UNIT/ML injection Commonly known as: NovoLOG Substitute to any brand approved.Before each meal 3 times a day, 140-199 - 2 units, 200-250 - 4 units, 251-299 - 6 units,  300-349 - 8 units,  350 or above 10 units. Dispense syringes and needles as needed, Ok to switch to PEN if approved. DX DM2, Code E11.65   lidocaine-prilocaine cream Commonly known as: EMLA Apply 1 application topically Every Tuesday,Thursday,and Saturday with dialysis. 1 hour prior to dialysis   midodrine 5 MG tablet Commonly known as: PROAMATINE Take 1 tablet (5 mg total) by mouth 3 (three) times daily with meals.   multivitamin Tabs tablet Take  1 tablet by mouth at bedtime.   nitroGLYCERIN 0.4 MG SL tablet Commonly known as: NITROSTAT Place 0.4 mg under the tongue every 5 (five) minutes x 3 doses as needed for chest pain.   NovoLOG Mix 70/30 FlexPen (70-30) 100 UNIT/ML FlexPen Generic drug: insulin aspart protamine - aspart Inject 25-30 Units into the skin See admin instructions. 30 units in the morning & 25 units at night   ondansetron 4 MG disintegrating tablet Commonly known as: ZOFRAN-ODT Take 4 mg by mouth every 6 (six) hours as needed for nausea or vomiting.   oxyCODONE 5 MG immediate release tablet Commonly known as: Oxy IR/ROXICODONE Take 1 tablet (5 mg total) by mouth every 6 (six) hours as needed for moderate pain or severe pain (pain score 4-6).   pantoprazole 40 MG tablet Commonly known as: PROTONIX Take 1 tablet (40 mg total) by mouth daily.   polyethylene glycol 17 g packet Commonly known as: MIRALAX / GLYCOLAX Take 17 g by mouth daily as needed for mild constipation.   sevelamer carbonate 800 MG tablet Commonly known as: RENVELA Take 1,600 mg by mouth 3 (three) times daily with meals.       Follow-up Information    Newt Minion, MD In 1 week.   Specialty: Orthopedic Surgery Contact information: [place] [address] [phone]        Serafina Mitchell, MD. Schedule an appointment as soon as possible for a visit in 2 week(s).   Specialties: Vascular Surgery, Cardiology Contact information: [address] [place] [address] [phone]           Major procedures and Radiology Reports - PLEASE review detailed and final reports thoroughly  -       DG Chest 1 View  Result Date: 08/18/2019 CLINICAL DATA:  [place]. EXAM: CHEST  1 VIEW COMPARISON:  May 29, 2019 FINDINGS: There is no evidence of acute infiltrate, pleural  effusion or pneumothorax. There is stable marked severity enlargement of the cardiac silhouette. Multiple radiopaque surgical clips are seen along the  left axilla. Degenerative changes seen throughout the thoracic spine. IMPRESSION: Stable cardiomegaly without evidence of acute or active cardiopulmonary disease. Electronically Signed   By: Virgina Norfolk M.D.   On: 08/18/2019 21:45   DG Ankle Complete Left  Result Date: 08/19/2019 CLINICAL DATA:  Pain and swelling. Osteomyelitis. EXAM: LEFT ANKLE COMPLETE - 3+ VIEW COMPARISON:  None. FINDINGS: There is no acute displaced fracture or dislocation. There is osteopenia which limits detection of nondisplaced fractures. There may be ulceration overlying the medial malleolus without definite evidence for underlying osteomyelitis. There is a moderate-sized Achilles tendon enthesophyte. There is a small plantar calcaneal spur. Advanced vascular calcifications are noted. IMPRESSION: 1. No acute displaced fracture or dislocation. 2. Possible ulceration overlying the medial malleolus without definite evidence for underlying osteomyelitis. 3. Advanced vascular calcifications. Electronically Signed   By: Constance Holster M.D.   On: 08/19/2019 19:56   DG Ankle Complete Right  Result Date: 08/19/2019 CLINICAL DATA:  Osteomyelitis of the ankle and foot. EXAM: RIGHT ANKLE - COMPLETE 3+ VIEW COMPARISON:  February 01, 2013 FINDINGS: There are advanced degenerative changes of the ankle mortise with surrounding soft tissue swelling. There is no acute displaced fracture. No dislocation. Advanced degenerative changes are noted of the midfoot. There is a moderate-sized plantar calcaneal spur. There is an Achilles tendon enthesophyte. Vascular calcifications are noted. There is no radiographic evidence for osteomyelitis. IMPRESSION: 1. No acute osseous abnormality. 2. Advanced degenerative changes are noted of the midfoot and ankle mortise. Electronically Signed   By: Constance Holster M.D.   On: 08/19/2019 19:57   DG Abd 1 View  Result Date: 08/18/2019 CLINICAL DATA:  Vomiting, constipation EXAM: ABDOMEN - 1 VIEW  COMPARISON:  05/12/2019 FINDINGS: There is a non obstructive bowel gas pattern. No supine evidence of free air. No organomegaly or suspicious calcification. No acute bony abnormality. Aortoiliac atherosclerosis. IMPRESSION: No acute findings. Electronically Signed   By: Rolm Baptise M.D.   On: 08/18/2019 21:40   MR FOOT RIGHT WO CONTRAST  Result Date: 08/26/2019 CLINICAL DATA:  Right heel ulcer. Prior fifth toe amputation 4 osteomyelitis. EXAM: MRI OF THE RIGHT FOOT WITHOUT CONTRAST TECHNIQUE: Multiplanar, multisequence MR imaging of the foot was performed. No intravenous contrast was administered. Osteomyelitis protocol MRI of the foot was obtained, to include the entire foot and ankle. This protocol uses a large field of view to cover the entire foot and ankle, and is suitable for assessing bony structures for osteomyelitis. Due to the large field of view and imaging plane choice, this protocol is less sensitive for assessing small structures such as ligamentous structures of the foot and ankle, compared to a dedicated forefoot or dedicated hindfoot exam. COMPARISON:  Right foot and ankle x-rays dated August 19, 2019. FINDINGS: Bones/Joint/Cartilage No suspicious marrow signal abnormality. Prior amputation of the fifth toe and metatarsal head. No fracture or dislocation. Advanced degenerative changes of the ankle and midfoot with midfoot collapse. Small tibiotalar joint effusion. Ligaments Collateral ligaments are intact. Muscles and Tendons Flexor, peroneal and extensor compartment tendons are intact. Achilles tendon is intact with mild distal tendinosis. Complete fatty atrophy of the intrinsic foot muscles. Soft tissue Focal susceptibility artifact within the plantar soft tissues at the level of the proximal fourth metatarsal, corresponding to the thin linear foreign body seen on recent x-ray. Additional small area of susceptibility artifact in the plantar soft tissues  at the level of the second  intermetatarsal space without evident foreign body seen on recent x-ray. No fluid collection or hematoma. No soft tissue mass. IMPRESSION: 1. No evidence of osteomyelitis. 2. Focal susceptibility artifact within the plantar soft tissues at the level of the proximal fourth metatarsal, corresponding to a thin linear foreign body seen on recent x-ray. 3. 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 Electronically Signed   By: Titus Dubin M.D.   On: 08/26/2019 17:29   PERIPHERAL VASCULAR CATHETERIZATION  Result Date: 08/22/2019 Patient name: Alexis [name] MRN: [id number] DOB: 1946/03/24 Sex: female 08/22/2019 Pre-operative Diagnosis: Bilateral lower extremity ulcer Post-operative diagnosis:  Same Surgeon:  [name] Procedure Performed:  1.  Ultrasound-guided access, right femoral artery  2.  Ultrasound-guided access, left femoral artery  3.  Abdominal aortogram  4.  Bilateral lower extremity runoff  5.  Intravascular lithotripsy, right superficial femoral and popliteal artery  6.  Drug-eluting stent, right superficial femoral-popliteal artery  7.  Conscious sedation, 143 minutes Indications: The patient has extensive bilateral lower extremity ulcers.  She is here today for limb salvage. Procedure:  The patient was identified in the holding area and taken to room 8.  The patient was then placed supine on the table and prepped and draped in the usual sterile fashion.  A time out was called.  Conscious sedation was administered with the use of IV fentanyl and Versed under continuous physician and nurse monitoring.  Heart rate, blood pressure, and oxygen saturation were continuously monitored.  Total sedation time was 143 minutes.  I initially tried to get access into the left common femoral artery, however I was unsuccessful therefore I turned my attention towards the right femoral artery.  Ultrasound was used to evaluate the right common femoral artery.  It was patent .  A digital ultrasound  image was acquired.  A micropuncture needle was used to access the right common femoral artery under ultrasound guidance.  An 018 wire was advanced without resistance and a micropuncture sheath was placed.  The 018 wire was removed and a benson wire was placed.  The micropuncture sheath was exchanged for a 5 french sheath.  An omniflush catheter was advanced over the wire to the level of L-1.  An abdominal angiogram was obtained.  Next, the cath was pulled out of the aortic bifurcation and bilateral runoff was performed.  I then followed up for the intervention by gaining access under ultrasound guidance of the left common femoral artery. Findings:  Aortogram: No significant infrarenal aortic stenosis.  Bilateral common and external iliac arteries are patent throughout the course however they are heavily calcified.   Right Lower Extremity:  The right common femoral and profundofemoral artery are heavily calcified but patent throughout their course.  The superficial femoral artery is diffusely diseased with multiple high-grade lesions down to the adductor canal where it occludes.  There is reconstitution of the popliteal artery just below the joint space.  There is two-vessel runoff via the peroneal and posterior tibial artery.  Left Lower Extremity: The common femoral and profundofemoral artery are patent without stenosis but heavily calcified.  The superficial femoral artery is occluded with reconstitution of the superficial femoral artery at the adductor canal with diffusely diseased three-vessel runoff and minimal opacification of the digital arteries. Intervention: After the above images were acquired the decision made to proceed with intervention.  The sheath in the right groin was removed.  I tried to use a minx however this failed  because the balloon popped.  Manual pressure was held for 10 minutes.  I then placed a 7 x 45 sheath from the left groin into the right external iliac artery and fully heparinized  the patient.  Using a quick cross catheter and 035 Glidewire followed by a Glidewire advantage I was able to get across the stenosis.  The lesion was predilated with a 4 x 100 balloon.  I then performed shockwave intravascular ultrasound of the popliteal artery just distal to the joint space up to its origin.  A 5 x 60 balloon was used for the distal half and a 6 x 60 balloon was used for the proximal half.  Total treatment time was 510 seconds.  I then elected to stent the treated area.  A 5 x 100 Tigris stent was placed distally followed by [place] 6 x 120, 7 x 120, and 7 x 40.  The stents were then molded with a 5 mm balloon and completion imaging showed inline flow down across the foot.  Catheters and wires were removed.  A short 7 French sheath was placed.  Patient tolerated the procedure well there were no complications. 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  Successfully recanalized and treated with shockwave intravascular lithotripsy followed by drug-eluting stent placement from just below the joint space in the popliteal artery up to the origin of the superficial femoral artery.  There is two-vessel runoff via the peroneal and posterior tibial artery.  #2  Left superficial femoral artery occlusion  V. [name], M.D., [facility] [place] Office: [phone] Pager:  [phone]  DG Foot 2 Views Left  Result Date: 08/19/2019 CLINICAL DATA:  Osteomyelitis of the ankle or foot. EXAM: LEFT FOOT - 2 VIEW COMPARISON:  08/18/2019 FINDINGS: Again noted is a foreign body in the medial soft tissues of the left foot. There are degenerative changes of the interphalangeal joints. Advanced vascular calcifications are noted. There is a moderate-sized plantar calcaneal spur. There is no acute displaced fracture or dislocation. There is no definite radiographic evidence for osteomyelitis. IMPRESSION: 1. No definite radiographic evidence  for osteomyelitis. 2. Stable foreign body in the medial soft tissues of the left foot. 3. Degenerative changes of the interphalangeal joints. Electronically Signed   By: Constance Holster M.D.   On: 08/19/2019 19:55   DG Foot Complete Right  Result Date: 08/19/2019 CLINICAL DATA:  Osteomyelitis. 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 There is a lucency at the head of the fifth metatarsal. There is some surrounding soft tissue swelling. Vascular calcifications are noted. There are degenerative changes of the midfoot. IMPRESSION: 1. No acute displaced fracture or dislocation. 2. 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 There is a persistent ill-defined lucency at the head of the fifth metatarsal which can be seen in patients with osteomyelitis. Electronically Signed   By: Constance Holster M.D.   On: 08/19/2019 19:59   VAS [place] ABI WITH/WO TBI  Result Date: 08/21/2019 LOWER EXTREMITY DOPPLER STUDY Indications: Gangrene, and peripheral artery disease. High Risk Factors: Diabetes.  Performing Technologist: June Leap Rvt, Rdms  Examination Guidelines: A complete evaluation includes at minimum, Doppler waveform signals and systolic blood pressure reading at the level of bilateral brachial, anterior tibial, and posterior tibial arteries, when vessel segments are accessible. Bilateral testing is considered  an integral part of a complete examination. Photoelectric Plethysmograph (PPG) waveforms and toe systolic pressure readings are included as required and additional duplex testing as needed. Limited examinations for reoccurring indications may be performed as noted.  ABI Findings: +---------+-----------------+-----+------------------+-------------------------+ Right    Rt Pressure      IndexWaveform          Comment                            (mmHg)                                                             +---------+-----------------+-----+------------------+-------------------------+ Brachial                                         BP not taken-restricted                                                    arm                       +---------+-----------------+-----+------------------+-------------------------+ ATA      206              1.02 dampened                                                                   monophasic                                  +---------+-----------------+-----+------------------+-------------------------+ PTA      147              0.73 dampened                                                                   monophasic                                  +---------+-----------------+-----+------------------+-------------------------+ Great Toe                      Abnormal                                    +---------+-----------------+-----+------------------+-------------------------+ +---------+------------------+-----+-------------------+-------+ Left     Lt Pressure (mmHg)IndexWaveform           Comment +---------+------------------+-----+-------------------+-------+ Brachial 201  triphasic                  +---------+------------------+-----+-------------------+-------+ ATA      97                0.48 dampened monophasic        +---------+------------------+-----+-------------------+-------+ PTA      19                0.09 dampened monophasic        +---------+------------------+-----+-------------------+-------+ Great Toe                       Abnormal                   +---------+------------------+-----+-------------------+-------+ Great toe PPG waveform too dampened to obtain pressures bilaterally.  Summary: Right: ABI is within normal range by ATA pressure, however abnormal pedal artery waveforms suggest this is falsely elevated. ABI is in the  moderate range based on PTA pressure. Severely dampened PPG waveform of Great toe. Left: Resting left ankle-brachial index indicates severe left lower extremity arterial disease. Severely dampened PPG waveform of Great toe.  *See table(s) above for measurements and observations.  Electronically signed by Servando Snare MD on 08/21/2019 at 5:13:35 PM.    Final     Micro Results     Recent Results (from the past 240 hour(s))  SARS CORONAVIRUS 2 (TAT 6-24 HRS) Nasopharyngeal Nasopharyngeal Swab     Status: None   Collection Time: 08/22/19  4:25 AM   Specimen: Nasopharyngeal Swab  Result Value Ref Range Status   SARS Coronavirus 2 NEGATIVE NEGATIVE Final    Comment: (NOTE) SARS-CoV-2 target nucleic acids are NOT DETECTED. The SARS-CoV-2 RNA is generally detectable in upper and lower respiratory specimens during the acute phase of infection. Negative results do not preclude SARS-CoV-2 infection, do not rule out co-infections with other pathogens, and should not be used as the sole basis for treatment or other patient management decisions. Negative results must be combined with clinical observations, patient history, and epidemiological information. The expected result is Negative. Fact Sheet for Patients: SugarRoll.be Fact Sheet for Healthcare Providers: https://www.woods-mathews.com/ This test is not yet approved or cleared by the [place] FDA and  has been authorized for detection and/or diagnosis of SARS-CoV-2 by FDA under an Emergency Use Authorization (EUA). This EUA will remain  in effect (meaning this test can be used) for the duration of the COVID-19 declaration under Section 56 4(b)(1) of the Act, 21 U.S.C. section 360bbb-3(b)(1), unless the authorization is terminated or revoked sooner. Performed at [facility], [place] [address], [place], [place] [address]   SARS CORONAVIRUS 2 (TAT 6-24 HRS) Nasopharyngeal Nasopharyngeal Swab      Status: None   Collection Time: 08/30/19  2:13 PM   Specimen: Nasopharyngeal Swab  Result Value Ref Range Status   SARS Coronavirus 2 NEGATIVE NEGATIVE Final    Comment: (NOTE) SARS-CoV-2 target nucleic acids are NOT DETECTED. The SARS-CoV-2 RNA is generally detectable in upper and lower respiratory specimens during the acute phase of infection. Negative results do not preclude SARS-CoV-2 infection, do not rule out co-infections with other pathogens, and should not be used as the sole basis for treatment or other patient management decisions. Negative results must be combined with clinical observations, patient history, and epidemiological information. The expected result is Negative. Fact Sheet for Patients: SugarRoll.be Fact Sheet for Healthcare Providers: https://www.woods-mathews.com/ This test is not yet approved or cleared by the  [place] Architectural technologist and  has been authorized for detection and/or diagnosis of SARS-CoV-2 by FDA under an Print production planner (EUA). This EUA will remain  in effect (meaning this test can be used) for the duration of the COVID-19 declaration under Section 56 4(b)(1) of the Act, 21 U.S.C. section 360bbb-3(b)(1), unless the authorization is terminated or revoked sooner. Performed at [facility], [place] [address], [place], [place] [address]     Today   Subjective    Teyonna Plaisted today has no headache,no chest abdominal pain,no new weakness tingling or numbness, feels much better    Objective   Blood pressure (!) 118/56, pulse 81, temperature 98.8 F (37.1 C), temperature source Oral, resp. rate 17, height 5\' 6"  (1.676 m), weight 88 kg, SpO2 94 %.   Intake/Output Summary (Last 24 hours) at 08/31/2019 1011 Last data filed at 08/30/2019 2200 Gross per 24 hour  Intake 240 ml  Output --  Net 240 ml    Exam  Awake , mildly confused, No new F.N deficits, Normal affect Omega.AT,PERRAL Supple  Neck,No JVD, No cervical lymphadenopathy appriciated.  Symmetrical Chest wall movement, Good air movement bilaterally, CTAB RRR,No Gallops,Rubs or new Murmurs, No Parasternal Heave +ve B.Sounds, Abd Soft, Non tender, No organomegaly appriciated, No rebound -guarding or rigidity. No Cyanosis, Clubbing or edema, L- AKA stump under bandage   Data Review   CBC w Diff:  Lab Results  Component Value Date   WBC 9.2 08/31/2019   HGB 9.3 (L) 08/31/2019   HCT 29.2 (L) 08/31/2019   PLT 232 08/31/2019   LYMPHOPCT 14 08/31/2019   MONOPCT 12 08/31/2019   EOSPCT 6 08/31/2019   BASOPCT 1 08/31/2019    CMP:  Lab Results  Component Value Date   NA 136 08/31/2019   K 3.5 08/31/2019   CL 96 (L) 08/31/2019   CO2 26 08/31/2019   BUN 21 08/31/2019   CREATININE 6.45 (H) 08/31/2019   PROT 7.8 08/24/2019   ALBUMIN 2.4 (L) 08/31/2019   BILITOT 1.0 08/24/2019   ALKPHOS 48 08/24/2019   AST 14 (L) 08/24/2019   ALT 7 08/24/2019  .   Total Time in preparing paper work, data evaluation and todays exam - 51 minutes  Lala Lund M.D on 08/31/2019 at 10:11 AM  [facility]   Office  [phone]

## 2019-08-31 NOTE — TOC Progression Note (Signed)
Transition of Care [facility]) - Progression Note    Patient Details  Name: Alexis [name] MRN: [id number] Date of Birth: September 02, 1945  Transition of Care [facility]) CM/SW [place], [place] Phone Number: [phone] 08/31/2019, 12:48 PM  Clinical Narrative:     CSW noticed that discharge summary was in therefore followed up with facility. CSW had to leave voice message.  TOC team will continue to follow for discharge planning needs.   Expected Discharge Plan: Skilled Nursing Facility Barriers to Discharge: Insurance Authorization, Continued Medical Work up, SNF Pending bed offer(patient will need outpatient HD clinic setup w/SNF)  Expected Discharge Plan and Services Expected Discharge Plan: [place] In-house Referral: Clinical Social Work       Expected Discharge Date: 08/31/19                                     Social Determinants of Health (SDOH) Interventions    Readmission Risk Interventions Readmission Risk Prevention Plan 06/12/2019  Transportation Screening Complete  PCP or Specialist Appt within 3-5 Days Not Complete  HRI or [place] Complete  Social Work Consult for [place] Planning/Counseling [place] Not Complete  Medication Review Press photographer) Complete  Some recent data might be hidden

## 2019-08-31 NOTE — TOC Transition Note (Addendum)
Transition of Care [facility]) - CM/SW Discharge Note   Patient Details  Name: Alexis [name] MRN: [id number] Date of Birth: September 01, 1945  Transition of Care [name]) CM/SW Contact:  Bary Castilla, LCSW Phone Number: [phone] 08/31/2019, 1:36 PM   Clinical Narrative:     Patient will DC to:?[facility] Anticipated DC date:?08/31/19 Family notified:?[facility] ZP:HXTA   Per MD patient ready for DC to [facility]. RN, patient, patient's family, and facility notified of DC. Discharge Summary sent to facility. RN given number for report [phone] room b16 RN Ebony Hail  . DC packet on chart. Ambulance transport requested for patient.   CSW signing off.   [place], [place] [phone]   Final next level of care: Skilled Nursing Facility Barriers to Discharge: No Barriers Identified   Patient Goals and CMS Choice        Discharge Placement              Patient chooses bed at: [facility]) Patient to be transferred to facility by: [place] Name of family member notified: [name] Patient and family notified of of transfer: 08/31/19  Discharge Plan and Services In-house Referral: Clinical Social Work                                   Social Determinants of Health (SDOH) Interventions     Readmission Risk Interventions Readmission Risk Prevention Plan 06/12/2019  Transportation Screening Complete  PCP or Specialist Appt within 3-5 Days Not Complete  HRI or [place] Complete  Social Work Consult for [place] Planning/Counseling [place] Not Complete  Medication Review Press photographer) Complete  Some recent data might be hidden

## 2019-08-31 NOTE — Progress Notes (Signed)
Discharged to home with family office visits in place teaching done  

## 2019-09-02 ENCOUNTER — Telehealth: Payer: Self-pay | Admitting: Orthopedic Surgery

## 2019-09-02 NOTE — Telephone Encounter (Signed)
Pt is s/p a AKA and wearing a wound vac. Has an appt in the office 09/04/19 will hold this message pending appt and call with updated orders for the pt.

## 2019-09-02 NOTE — Telephone Encounter (Signed)
Received call from [facility] with [facility] and rehab needing wound care orders for the patient. The number to contact [place] is [phone]

## 2019-09-03 DIAGNOSIS — N2581 Secondary hyperparathyroidism of renal origin: Secondary | ICD-10-CM | POA: Diagnosis not present

## 2019-09-03 DIAGNOSIS — I480 Paroxysmal atrial fibrillation: Secondary | ICD-10-CM | POA: Diagnosis not present

## 2019-09-03 DIAGNOSIS — Z992 Dependence on renal dialysis: Secondary | ICD-10-CM | POA: Diagnosis not present

## 2019-09-03 DIAGNOSIS — I739 Peripheral vascular disease, unspecified: Secondary | ICD-10-CM | POA: Diagnosis not present

## 2019-09-03 DIAGNOSIS — L97314 Non-pressure chronic ulcer of right ankle with necrosis of bone: Secondary | ICD-10-CM | POA: Diagnosis not present

## 2019-09-03 DIAGNOSIS — N186 End stage renal disease: Secondary | ICD-10-CM | POA: Diagnosis not present

## 2019-09-04 ENCOUNTER — Encounter: Payer: Self-pay | Admitting: Physician Assistant

## 2019-09-04 ENCOUNTER — Other Ambulatory Visit: Payer: Self-pay

## 2019-09-04 ENCOUNTER — Ambulatory Visit: Payer: Medicare Other | Admitting: Family Medicine

## 2019-09-04 ENCOUNTER — Ambulatory Visit (INDEPENDENT_AMBULATORY_CARE_PROVIDER_SITE_OTHER): Payer: Medicare Other | Admitting: Physician Assistant

## 2019-09-04 ENCOUNTER — Inpatient Hospital Stay: Payer: Medicare Other | Admitting: Family

## 2019-09-04 VITALS — Ht 66.0 in | Wt 188.0 lb

## 2019-09-04 DIAGNOSIS — S78112A Complete traumatic amputation at level between left hip and knee, initial encounter: Secondary | ICD-10-CM

## 2019-09-04 DIAGNOSIS — L89514 Pressure ulcer of right ankle, stage 4: Secondary | ICD-10-CM | POA: Diagnosis not present

## 2019-09-04 DIAGNOSIS — E11622 Type 2 diabetes mellitus with other skin ulcer: Secondary | ICD-10-CM

## 2019-09-04 DIAGNOSIS — L89313 Pressure ulcer of right buttock, stage 3: Secondary | ICD-10-CM | POA: Diagnosis not present

## 2019-09-04 DIAGNOSIS — L97314 Non-pressure chronic ulcer of right ankle with necrosis of bone: Secondary | ICD-10-CM

## 2019-09-04 DIAGNOSIS — L89893 Pressure ulcer of other site, stage 3: Secondary | ICD-10-CM | POA: Diagnosis not present

## 2019-09-04 DIAGNOSIS — L89323 Pressure ulcer of left buttock, stage 3: Secondary | ICD-10-CM | POA: Diagnosis not present

## 2019-09-04 DIAGNOSIS — Z89612 Acquired absence of left leg above knee: Secondary | ICD-10-CM

## 2019-09-04 NOTE — Progress Notes (Signed)
Office Visit Note   Patient: Alexis [name]           Date of Birth: 07-10-1945           MRN: [id number] Visit Date: 09/04/2019              Requested by: No referring provider defined for this encounter. PCP: System, Provider Not In  Chief Complaint  Patient presents with  . Left Leg - Routine Post Op    08/25/19 left AKA      HPI: This is a pleasant 74 year old woman who is status post above-knee amputation.  She is currently residing at a nursing facility her daughter is with her today  Assessment & Plan: Visit Diagnoses: No diagnosis found.  Plan: She is actually doing quite well.  Continue daily dressing changes may get this wet with mild soap and water follow-up in 1 week  Follow-Up Instructions: No follow-ups on file.   Ortho Exam  Patient is alert, oriented, no adenopathy, well-dressed, normal affect, normal respiratory effort. Focused examination of left above-knee amputation stump incision has well apposed wound edges without any sign of dehiscence.  Skin is in good condition.  Minimal drain.  No evidence of necrosis no foul odor surgical staples are intact  Imaging: No results found.   Labs: Lab Results  Component Value Date   HGBA1C 6.2 (H) 08/18/2019   HGBA1C 6.4 (H) 05/09/2019   HGBA1C 6.7 (H) 03/25/2011   ESRSEDRATE 53 (H) 08/18/2019   CRP 11.5 (H) 08/26/2019   CRP 11.5 (H) 08/25/2019   CRP 0.7 08/18/2019   REPTSTATUS 08/23/2019 FINAL 08/18/2019   CULT  08/18/2019    NO GROWTH 5 DAYS Performed at [facility], [place] [address] [place], [place] [address]    LABORGA ESCHERICHIA COLI 08/19/2012     Lab Results  Component Value Date   ALBUMIN 2.4 (L) 08/31/2019   ALBUMIN 2.3 (L) 08/29/2019   ALBUMIN 2.2 (L) 08/27/2019    Lab Results  Component Value Date   MG 1.8 08/25/2019   MG 1.9 08/24/2019   MG 2.2 06/14/2019   Lab Results  Component Value Date   VD25OH (L) 06/13/2010    <10 (NOTE) This assay accurately quantifies Vitamin  D, which is the sum of the 25-Hydroxy forms of Vitamin D2 and D3.  Studies have shown that the optimum concentration of 25-Hydroxy Vitamin D is 30 ng/mL or higher.  Concentrations of Vitamin D between 20  and 29 ng/mL are considered to be insufficient and concentrations less than 20 ng/mL are considered to be deficient for Vitamin D.    No results found for: PREALBUMIN CBC EXTENDED Latest Ref Rng & Units 08/31/2019 08/30/2019 08/29/2019  WBC 4.0 - 10.5 K/uL 9.2 9.0 9.4  RBC 3.87 - 5.11 MIL/uL 3.62(L) 3.51(L) 3.08(L)  HGB 12.0 - 15.0 g/dL 9.3(L) 9.0(L) 8.0(L)  HCT 36.0 - 46.0 % 29.2(L) 28.4(L) 24.5(L)  PLT 150 - 400 K/uL 232 228 200  NEUTROABS 1.7 - 7.7 K/uL 6.1 5.8 6.3  LYMPHSABS 0.7 - 4.0 K/uL 1.3 1.3 1.3     Body mass index is 30.34 kg/m.  Orders:  No orders of the defined types were placed in this encounter.  No orders of the defined types were placed in this encounter.    Procedures: No procedures performed  Clinical Data: No additional findings.  ROS:  All other systems negative, except as noted in the HPI. Review of Systems  Objective: Vital Signs: Ht 5'  6" (1.676 m)   Wt 188 lb (85.3 kg)   BMI 30.34 kg/m   Specialty Comments:  No specialty comments available.  PMFS History: Patient Active Problem List   Diagnosis Date Noted  . Gangrene of left foot ([place])   . 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 PVD (peripheral vascular disease) ([place])   . Osteomyelitis ([place]) 08/18/2019  . Diabetic foot ulcers ([place]) 08/18/2019  . Emesis 08/18/2019  . Altered mental status 06/11/2019  . Generalized weakness 06/11/2019  . Acute delirium 06/11/2019  . Hallucinations 06/11/2019  . Acute CVA (cerebrovascular accident) ([place]) 06/11/2019  . Hypokalemia 05/12/2019  . Hyperlipidemia   . Pulmonary hypertension ([place])   . GERD (gastroesophageal reflux disease)   . Stable angina (H[facility])   . Atrial fibrillation with rapid ventricular response ([place]) 05/09/2019    . Elevated troponin   . ESRD on hemodialysis ([place])   . Dyspnea 08/13/2017  . Chest pain 08/04/2017  . 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 Essential hypertension 08/15/2012  . CAD (coronary artery disease) 03/25/2011  . Type II diabetes mellitus with manifestations ([place]) 03/25/2011  . Chest pain 03/25/2011  . CHF (congestive heart failure) ([place]) 10/14/2010  . Anemia in chronic kidney disease (CKD) 10/14/2010  . Obesity 10/14/2010   Past Medical History:  Diagnosis Date  . Arthritis   . Cancer [facility]) 2005    left breast  . CHF (congestive heart failure) ([place])   . Chronic back pain   . Chronic kidney disease 03/2014   dialysis t/th/sa  . Coronary artery disease   . Diabetes mellitus    Type 2  . Diabetic nephropathy ([place]) 01-08-13  . Dyslipidemia 01-08-13  . ESRD on hemodialysis ([place])    Tu, Th, Sat  . Headache   . Hyperlipidemia   . Hypertension   . LBBB (left bundle branch block)   . Proteinuria 01-08-13  . Pulmonary hypertension ([place])    PA peak pressure 73 mmHg 08/05/17 echo  . Thyroid disease 01-08-13   Hyper-parathyroidism-secondary  . Wears glasses     Family History  Problem Relation Age of Onset  . Breast cancer Mother        Died age 22  . Cancer Mother 21       Breast  . Heart disease Mother   . Hyperlipidemia Mother   . Hypertension Mother   . Heart attack Mother     Past Surgical History:  Procedure Laterality Date  . A/V FISTULAGRAM N/A 08/25/2017   Procedure: A/V FISTULAGRAM - Left Arm;  Surgeon: Angelia Mould, MD;  Location: [place] [facility];  Service: Cardiovascular;  Laterality: N/A;  . A/V FISTULAGRAM N/A 02/09/2018   Procedure: A/V [id number];  Surgeon: Elam Dutch, MD;  Location: [place] [facility];  Service: Cardiovascular;  Laterality: N/A;  . A/V FISTULAGRAM Left 04/04/2018   Procedure: A/V FISTULAGRAM;  Surgeon: Marty Heck, MD;  Location: [place] [facility];  Service:  Cardiovascular;  Laterality: Left;  . A/V FISTULAGRAM Right 07/22/2019   Procedure: A/V FISTULAGRAM - Right Arm;  Surgeon: Waynetta Sandy, MD;  Location: [place] [facility];  Service: Cardiovascular;  Laterality: Right;  . ABDOMINAL AORTAGRAM N/A 08/11/2014   Procedure: ABDOMINAL Maxcine Ham;  Surgeon: Angelia Mould, MD;  Location: [facility];  Service: Cardiovascular;  Laterality: N/A;  . ABDOMINAL AORTOGRAM W/LOWER EXTREMITY N/A 08/22/2019   Procedure: ABDOMINAL AORTOGRAM W/LOWER EXTREMITY;  Surgeon: Serafina Mitchell, MD;  Location: [place] [facility];  Service: Cardiovascular;  Laterality: N/A;  . ABDOMINAL HYSTERECTOMY    . AMPUTATION Left 08/25/2019   Procedure: LEFT AMPUTATION ABOVE KNEE;  Surgeon: Newt Minion, MD;  Location: [place];  Service: Orthopedics;  Laterality: Left;  . APPENDECTOMY    . Arm surgery     Left arm trauma  . AV FISTULA PLACEMENT Left 08/21/2014   Procedure: LEFT ARM ARTERIOVENOUS (AV) FISTULA CREATION ;  Surgeon: Angelia Mould, MD;  Location: [facility] OR;  Service: Vascular;  Laterality: Left;  . AV FISTULA PLACEMENT Right 06/13/2018   Procedure: insertion of right arm ARTERIOVENOUS (AV) gore-tex GRAFT;  Surgeon: Rosetta Posner, MD;  Location: [place];  Service: Vascular;  Laterality: Right;  . BACK SURGERY    . CATARACT EXTRACTION W/PHACO Left 12/22/2014   Procedure: CATARACT EXTRACTION PHACO AND INTRAOCULAR LENS PLACEMENT (IOC);  Surgeon: Tonny Branch, MD;  Location: [facility];  Service: Ophthalmology;  Laterality: Left;  CDE 13.48  . CORONARY ANGIOPLASTY  12/19/2003   inferior wall hypokinesis. ef 50%  . dialysis catheter    . INSERTION OF DIALYSIS CATHETER Right 06/13/2018   Procedure: INSERTION OF DIALYSIS CATHETER, right internal jugular;  Surgeon: Rosetta Posner, MD;  Location: [place];  Service: Vascular;  Laterality: Right;  . IR FLUORO GUIDE CV LINE RIGHT  04/06/2018  . IR REMOVAL TUN CV CATH W/O FL  04/25/2018  . IR REMOVAL TUN CV CATH W/O FL  07/20/2018    . IR US GUIDE VASC ACCESS RIGHT  04/06/2018  . LIGATION OF ARTERIOVENOUS  FISTULA Left 03/18/2019   Procedure: LIGATION OF ARTERIOVENOUS  FISTULA  LEFT ARM;  Surgeon: Angelia Mould, MD;  Location: [place];  Service: Vascular;  Laterality: Left;  Marland Kitchen MASTECTOMY     Left  . PERIPHERAL VASCULAR BALLOON ANGIOPLASTY Left 08/25/2017   Procedure: PERIPHERAL VASCULAR BALLOON ANGIOPLASTY;  Surgeon: Angelia Mould, MD;  Location: [place] [facility];  Service: Cardiovascular;  Laterality: Left;  arm fistula  . PERIPHERAL VASCULAR BALLOON ANGIOPLASTY Left 02/09/2018   Procedure: PERIPHERAL VASCULAR BALLOON ANGIOPLASTY;  Surgeon: Elam Dutch, MD;  Location: [place] [facility];  Service: Cardiovascular;  Laterality: Left;  AV Fistula  . PERIPHERAL VASCULAR BALLOON ANGIOPLASTY Left 04/04/2018   Procedure: PERIPHERAL VASCULAR BALLOON ANGIOPLASTY;  Surgeon: Marty Heck, MD;  Location: [place] [facility];  Service: Cardiovascular;  Laterality: Left;  UPPER ARM FISTULA  . PERIPHERAL VASCULAR CATHETERIZATION N/A 09/16/2015   Procedure: Fistulagram;  Surgeon: Rosetta Posner, MD;  Location: [place] [facility];  Service: Cardiovascular;  Laterality: N/A;  . PERIPHERAL VASCULAR INTERVENTION  08/22/2019   Procedure: PERIPHERAL VASCULAR INTERVENTION;  Surgeon: Serafina Mitchell, MD;  Location: [place] [facility];  Service: Cardiovascular;;  Rt. SFA and Popliteal w/Shockwavw treatment  . SPINE SURGERY    . TONSILLECTOMY    . TRANSTHORACIC ECHOCARDIOGRAM  10/01/2010    Left ventricle: The cavity size was mildly dilated. Wall thickness was increased in a pattern of mild LVH. Systolic function was   mildly reduced. The estimated ejection fraction was in the range  of 45% to 50%.   . TUBAL LIGATION     Social History   Occupational History  . Occupation: Retired  Tobacco Use  . Smoking status: Former Smoker    Packs/day: 1.00    Years: 50.00    Pack years: 50.00    Types: Cigarettes  Quit date:  05/16/1998    Years since quitting: 21.3  . Smokeless tobacco: Never Used  Substance and Sexual Activity  . Alcohol use: No    Alcohol/week: 0.0 standard drinks  . Drug use: No  . Sexual activity: Yes    Birth control/protection: Post-menopausal

## 2019-09-04 NOTE — Telephone Encounter (Signed)
[place] were written by PA at today's visit and sent to facility

## 2019-09-05 DIAGNOSIS — N2581 Secondary hyperparathyroidism of renal origin: Secondary | ICD-10-CM | POA: Diagnosis not present

## 2019-09-05 DIAGNOSIS — N186 End stage renal disease: Secondary | ICD-10-CM | POA: Diagnosis not present

## 2019-09-05 DIAGNOSIS — Z992 Dependence on renal dialysis: Secondary | ICD-10-CM | POA: Diagnosis not present

## 2019-09-09 ENCOUNTER — Inpatient Hospital Stay: Payer: Medicare Other | Admitting: Physician Assistant

## 2019-09-10 DIAGNOSIS — N2581 Secondary hyperparathyroidism of renal origin: Secondary | ICD-10-CM | POA: Diagnosis not present

## 2019-09-10 DIAGNOSIS — Z992 Dependence on renal dialysis: Secondary | ICD-10-CM | POA: Diagnosis not present

## 2019-09-10 DIAGNOSIS — N186 End stage renal disease: Secondary | ICD-10-CM | POA: Diagnosis not present

## 2019-09-11 ENCOUNTER — Ambulatory Visit (INDEPENDENT_AMBULATORY_CARE_PROVIDER_SITE_OTHER): Payer: Medicare Other | Admitting: Physician Assistant

## 2019-09-11 ENCOUNTER — Other Ambulatory Visit: Payer: Self-pay

## 2019-09-11 ENCOUNTER — Encounter: Payer: Self-pay | Admitting: Physician Assistant

## 2019-09-11 DIAGNOSIS — L89313 Pressure ulcer of right buttock, stage 3: Secondary | ICD-10-CM | POA: Diagnosis not present

## 2019-09-11 DIAGNOSIS — L89323 Pressure ulcer of left buttock, stage 3: Secondary | ICD-10-CM | POA: Diagnosis not present

## 2019-09-11 DIAGNOSIS — Z89612 Acquired absence of left leg above knee: Secondary | ICD-10-CM

## 2019-09-11 DIAGNOSIS — S78112A Complete traumatic amputation at level between left hip and knee, initial encounter: Secondary | ICD-10-CM

## 2019-09-11 DIAGNOSIS — L89893 Pressure ulcer of other site, stage 3: Secondary | ICD-10-CM | POA: Diagnosis not present

## 2019-09-11 DIAGNOSIS — L89514 Pressure ulcer of right ankle, stage 4: Secondary | ICD-10-CM | POA: Diagnosis not present

## 2019-09-11 NOTE — Progress Notes (Signed)
Office Visit Note   Patient: Alexis [name]           Date of Birth: 1945/10/07           MRN: [id number] Visit Date: 09/11/2019              Requested by: No referring provider defined for this encounter. PCP: System, Provider Not In  No chief complaint on file.     HPI: This is a pleasant woman who is now almost 2 weeks status post left above-knee amputation.  She was due to see me later this week but unfortunately had a fall 2 days ago at the nursing facility.  They were concerned because she had some increased bleeding over the incision.  She denies any pain except over the incision itself.  She denies any groin pain any hip pain any thigh pain  Assessment & Plan: Visit Diagnoses:  1. Above knee amputation of left lower extremity ([facility])     Plan: Status post fall status post left above-knee amputation.  She has a slight increase in bleeding but no wound dehiscence over a small section of the medial aspect of the wound.  She does have some increased swelling as well.  No fluctuance no cellulitis.  We will continue to have her elevate and do daily dry dressing changes patient is to follow-up with me in 1 week  Follow-Up Instructions: Return in about 1 week (around 09/18/2019).   Ortho Exam  Patient is alert, oriented, no adenopathy, well-dressed, normal affect, normal respiratory effort. Left above-knee amputation ; some increased swelling from last week but no cellulitis.  Wound edges are well opposed without any frank dehiscence.  No necrosis.  There is 1 area between 2 of the staples that has had some increased bloody drainage on the medial side of the wound  Imaging: No results found. No images are attached to the encounter.  Labs: Lab Results  Component Value Date   HGBA1C 6.2 (H) 08/18/2019   HGBA1C 6.4 (H) 05/09/2019   HGBA1C 6.7 (H) 03/25/2011   ESRSEDRATE 53 (H) 08/18/2019   CRP 11.5 (H) 08/26/2019   CRP 11.5 (H) 08/25/2019   CRP 0.7 08/18/2019   REPTSTATUS  08/23/2019 FINAL 08/18/2019   CULT  08/18/2019    NO GROWTH 5 DAYS Performed at [facility], [place] [address] [place], [place] [address]    LABORGA ESCHERICHIA COLI 08/19/2012     Lab Results  Component Value Date   ALBUMIN 2.4 (L) 08/31/2019   ALBUMIN 2.3 (L) 08/29/2019   ALBUMIN 2.2 (L) 08/27/2019    Lab Results  Component Value Date   MG 1.8 08/25/2019   MG 1.9 08/24/2019   MG 2.2 06/14/2019   Lab Results  Component Value Date   VD25OH (L) 06/13/2010    <10 (NOTE) This assay accurately quantifies Vitamin D, which is the sum of the 25-Hydroxy forms of Vitamin D2 and D3.  Studies have shown that the optimum concentration of 25-Hydroxy Vitamin D is 30 ng/mL or higher.  Concentrations of Vitamin D between 20  and 29 ng/mL are considered to be insufficient and concentrations less than 20 ng/mL are considered to be deficient for Vitamin D.    No results found for: PREALBUMIN CBC EXTENDED Latest Ref Rng & Units 08/31/2019 08/30/2019 08/29/2019  WBC 4.0 - 10.5 K/uL 9.2 9.0 9.4  RBC 3.87 - 5.11 MIL/uL 3.62(L) 3.51(L) 3.08(L)  HGB 12.0 - 15.0 g/dL 9.3(L) 9.0(L) 8.0(L)  HCT 36.0 -  46.0 % 29.2(L) 28.4(L) 24.5(L)  PLT 150 - 400 K/uL 232 228 200  NEUTROABS 1.7 - 7.7 K/uL 6.1 5.8 6.3  LYMPHSABS 0.7 - 4.0 K/uL 1.3 1.3 1.3     There is no height or weight on file to calculate BMI.  Orders:  No orders of the defined types were placed in this encounter.  No orders of the defined types were placed in this encounter.    Procedures: No procedures performed  Clinical Data: No additional findings.  ROS:  All other systems negative, except as noted in the HPI. Review of Systems  Objective: Vital Signs: There were no vitals taken for this visit.  Specialty Comments:  No specialty comments available.  PMFS History: Patient Active Problem List   Diagnosis Date Noted  . Gangrene of left foot ([employer])   . 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 PVD  (peripheral vascular disease) ([place])   . Osteomyelitis ([place]) 08/18/2019  . Diabetic foot ulcers ([place]) 08/18/2019  . Emesis 08/18/2019  . Altered mental status 06/11/2019  . Generalized weakness 06/11/2019  . Acute delirium 06/11/2019  . Hallucinations 06/11/2019  . Acute CVA (cerebrovascular accident) ([place]) 06/11/2019  . Hypokalemia 05/12/2019  . Hyperlipidemia   . Pulmonary hypertension ([place])   . GERD (gastroesophageal reflux disease)   . Stable angina ([facility])   . Atrial fibrillation with rapid ventricular response ([place]) 05/09/2019  . Elevated troponin   . ESRD on hemodialysis ([place])   . Dyspnea 08/13/2017  . Chest pain 08/04/2017  . 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 Essential hypertension 08/15/2012  . CAD (coronary artery disease) 03/25/2011  . Type II diabetes mellitus with manifestations ([place]) 03/25/2011  . Chest pain 03/25/2011  . CHF (congestive heart failure) ([place]) 10/14/2010  . Anemia in chronic kidney disease (CKD) 10/14/2010  . Obesity 10/14/2010   Past Medical History:  Diagnosis Date  . Arthritis   . Cancer [facility]) 2005    left breast  . CHF (congestive heart failure) ([place])   . Chronic back pain   . Chronic kidney disease 03/2014   dialysis t/th/sa  . Coronary artery disease   . Diabetes mellitus    Type 2  . Diabetic nephropathy ([place]) 01-08-13  . Dyslipidemia 01-08-13  . ESRD on hemodialysis ([place])    Tu, Th, Sat  . Headache   . Hyperlipidemia   . Hypertension   . LBBB (left bundle branch block)   . Proteinuria 01-08-13  . Pulmonary hypertension ([place])    PA peak pressure 73 mmHg 08/05/17 echo  . Thyroid disease 01-08-13   Hyper-parathyroidism-secondary  . Wears glasses     Family History  Problem Relation Age of Onset  . Breast cancer Mother        Died age 24  . Cancer Mother 73       Breast  . Heart disease Mother   . Hyperlipidemia Mother   . Hypertension Mother   . Heart attack Mother     Past Surgical  History:  Procedure Laterality Date  . A/V FISTULAGRAM N/A 08/25/2017   Procedure: A/V FISTULAGRAM - Left Arm;  Surgeon: Angelia Mould, MD;  Location: [place] [facility];  Service: Cardiovascular;  Laterality: N/A;  . A/V FISTULAGRAM N/A 02/09/2018   Procedure: A/V [id number];  Surgeon: Elam Dutch, MD;  Location: [place] [facility];  Service: Cardiovascular;  Laterality: N/A;  . A/V FISTULAGRAM Left 04/04/2018  Procedure: A/V FISTULAGRAM;  Surgeon: Marty Heck, MD;  Location: [place] [facility];  Service: Cardiovascular;  Laterality: Left;  . A/V FISTULAGRAM Right 07/22/2019   Procedure: A/V FISTULAGRAM - Right Arm;  Surgeon: Waynetta Sandy, MD;  Location: [place] [facility];  Service: Cardiovascular;  Laterality: Right;  . ABDOMINAL AORTAGRAM N/A 08/11/2014   Procedure: ABDOMINAL Maxcine Ham;  Surgeon: Angelia Mould, MD;  Location: [facility] C[facility] LAB;  Service: Cardiovascular;  Laterality: N/A;  . ABDOMINAL AORTOGRAM W/LOWER EXTREMITY N/A 08/22/2019   Procedure: ABDOMINAL AORTOGRAM W/LOWER EXTREMITY;  Surgeon: Serafina Mitchell, MD;  Location: [place] [facility];  Service: Cardiovascular;  Laterality: N/A;  . ABDOMINAL HYSTERECTOMY    . AMPUTATION Left 08/25/2019   Procedure: LEFT AMPUTATION ABOVE KNEE;  Surgeon: Newt Minion, MD;  Location: [place];  Service: Orthopedics;  Laterality: Left;  . APPENDECTOMY    . Arm surgery     Left arm trauma  . AV FISTULA PLACEMENT Left 08/21/2014   Procedure: LEFT ARM ARTERIOVENOUS (AV) FISTULA CREATION ;  Surgeon: Angelia Mould, MD;  Location: [facility];  Service: Vascular;  Laterality: Left;  . AV FISTULA PLACEMENT Right 06/13/2018   Procedure: insertion of right arm ARTERIOVENOUS (AV) gore-tex GRAFT;  Surgeon: Rosetta Posner, MD;  Location: [place];  Service: Vascular;  Laterality: Right;  . BACK SURGERY    . CATARACT EXTRACTION W/PHACO Left 12/22/2014   Procedure: CATARACT EXTRACTION PHACO AND INTRAOCULAR LENS PLACEMENT  (IOC);  Surgeon: Tonny Branch, MD;  Location: [facility];  Service: Ophthalmology;  Laterality: Left;  CDE 13.48  . CORONARY ANGIOPLASTY  12/19/2003   inferior wall hypokinesis. ef 50%  . dialysis catheter    . INSERTION OF DIALYSIS CATHETER Right 06/13/2018   Procedure: INSERTION OF DIALYSIS CATHETER, right internal jugular;  Surgeon: Rosetta Posner, MD;  Location: [place];  Service: Vascular;  Laterality: Right;  . IR FLUORO GUIDE CV LINE RIGHT  04/06/2018  . IR REMOVAL TUN CV CATH W/O FL  04/25/2018  . IR REMOVAL TUN CV CATH W/O FL  07/20/2018  . IR US GUIDE VASC ACCESS RIGHT  04/06/2018  . LIGATION OF ARTERIOVENOUS  FISTULA Left 03/18/2019   Procedure: LIGATION OF ARTERIOVENOUS  FISTULA  LEFT ARM;  Surgeon: Angelia Mould, MD;  Location: [place];  Service: Vascular;  Laterality: Left;  Marland Kitchen MASTECTOMY     Left  . PERIPHERAL VASCULAR BALLOON ANGIOPLASTY Left 08/25/2017   Procedure: PERIPHERAL VASCULAR BALLOON ANGIOPLASTY;  Surgeon: Angelia Mould, MD;  Location: [place] [facility];  Service: Cardiovascular;  Laterality: Left;  arm fistula  . PERIPHERAL VASCULAR BALLOON ANGIOPLASTY Left 02/09/2018   Procedure: PERIPHERAL VASCULAR BALLOON ANGIOPLASTY;  Surgeon: Elam Dutch, MD;  Location: [place] [facility];  Service: Cardiovascular;  Laterality: Left;  AV Fistula  . PERIPHERAL VASCULAR BALLOON ANGIOPLASTY Left 04/04/2018   Procedure: PERIPHERAL VASCULAR BALLOON ANGIOPLASTY;  Surgeon: Marty Heck, MD;  Location: [place] [facility];  Service: Cardiovascular;  Laterality: Left;  UPPER ARM FISTULA  . PERIPHERAL VASCULAR CATHETERIZATION N/A 09/16/2015   Procedure: Fistulagram;  Surgeon: Rosetta Posner, MD;  Location: [place] [facility];  Service: Cardiovascular;  Laterality: N/A;  . PERIPHERAL VASCULAR INTERVENTION  08/22/2019   Procedure: PERIPHERAL VASCULAR INTERVENTION;  Surgeon: Serafina Mitchell, MD;  Location: [place] [facility];  Service: Cardiovascular;;  Rt. SFA and Popliteal  w/Shockwavw treatment  . SPINE SURGERY    . TONSILLECTOMY    .  TRANSTHORACIC ECHOCARDIOGRAM  10/01/2010    Left ventricle: The cavity size was mildly dilated. Wall thickness was increased in a pattern of mild LVH. Systolic function was   mildly reduced. The estimated ejection fraction was in the range  of 45% to 50%.   . TUBAL LIGATION     Social History   Occupational History  . Occupation: Retired  Tobacco Use  . Smoking status: Former Smoker    Packs/day: 1.00    Years: 50.00    Pack years: 50.00    Types: Cigarettes    Quit date: 05/16/1998    Years since quitting: 21.3  . Smokeless tobacco: Never Used  Substance and Sexual Activity  . Alcohol use: No    Alcohol/week: 0.0 standard drinks  . Drug use: No  . Sexual activity: Yes    Birth control/protection: Post-menopausal

## 2019-09-12 ENCOUNTER — Encounter (HOSPITAL_COMMUNITY): Payer: Self-pay

## 2019-09-12 ENCOUNTER — Telehealth: Payer: Self-pay | Admitting: Family

## 2019-09-12 ENCOUNTER — Other Ambulatory Visit: Payer: Self-pay

## 2019-09-12 ENCOUNTER — Inpatient Hospital Stay (HOSPITAL_COMMUNITY)
Admission: EM | Admit: 2019-09-12 | Discharge: 2019-09-25 | DRG: 377 | Disposition: A | Discharge status: Skilled Nursing Facility | Payer: Medicare Other | Source: Other Acute Inpatient Hospital | Attending: Family Medicine | Admitting: Family Medicine

## 2019-09-12 DIAGNOSIS — N186 End stage renal disease: Secondary | ICD-10-CM

## 2019-09-12 DIAGNOSIS — L89312 Pressure ulcer of right buttock, stage 2: Secondary | ICD-10-CM | POA: Diagnosis present

## 2019-09-12 DIAGNOSIS — F0390 Unspecified dementia without behavioral disturbance: Secondary | ICD-10-CM | POA: Diagnosis present

## 2019-09-12 DIAGNOSIS — K3189 Other diseases of stomach and duodenum: Secondary | ICD-10-CM | POA: Diagnosis not present

## 2019-09-12 DIAGNOSIS — Z7982 Long term (current) use of aspirin: Secondary | ICD-10-CM

## 2019-09-12 DIAGNOSIS — Z8673 Personal history of transient ischemic attack (TIA), and cerebral infarction without residual deficits: Secondary | ICD-10-CM

## 2019-09-12 DIAGNOSIS — I132 Hypertensive heart and chronic kidney disease with heart failure and with stage 5 chronic kidney disease, or end stage renal disease: Secondary | ICD-10-CM | POA: Diagnosis present

## 2019-09-12 DIAGNOSIS — Z7901 Long term (current) use of anticoagulants: Secondary | ICD-10-CM

## 2019-09-12 DIAGNOSIS — E785 Hyperlipidemia, unspecified: Secondary | ICD-10-CM | POA: Diagnosis present

## 2019-09-12 DIAGNOSIS — K922 Gastrointestinal hemorrhage, unspecified: Secondary | ICD-10-CM | POA: Diagnosis not present

## 2019-09-12 DIAGNOSIS — Z853 Personal history of malignant neoplasm of breast: Secondary | ICD-10-CM

## 2019-09-12 DIAGNOSIS — K921 Melena: Secondary | ICD-10-CM | POA: Diagnosis present

## 2019-09-12 DIAGNOSIS — E1151 Type 2 diabetes mellitus with diabetic peripheral angiopathy without gangrene: Secondary | ICD-10-CM | POA: Diagnosis not present

## 2019-09-12 DIAGNOSIS — I4891 Unspecified atrial fibrillation: Secondary | ICD-10-CM | POA: Diagnosis present

## 2019-09-12 DIAGNOSIS — I447 Left bundle-branch block, unspecified: Secondary | ICD-10-CM | POA: Diagnosis present

## 2019-09-12 DIAGNOSIS — E162 Hypoglycemia, unspecified: Secondary | ICD-10-CM | POA: Diagnosis not present

## 2019-09-12 DIAGNOSIS — Z992 Dependence on renal dialysis: Secondary | ICD-10-CM | POA: Diagnosis not present

## 2019-09-12 DIAGNOSIS — Z8249 Family history of ischemic heart disease and other diseases of the circulatory system: Secondary | ICD-10-CM

## 2019-09-12 DIAGNOSIS — I959 Hypotension, unspecified: Secondary | ICD-10-CM | POA: Diagnosis present

## 2019-09-12 DIAGNOSIS — I9589 Other hypotension: Secondary | ICD-10-CM | POA: Diagnosis not present

## 2019-09-12 DIAGNOSIS — L89322 Pressure ulcer of left buttock, stage 2: Secondary | ICD-10-CM | POA: Diagnosis not present

## 2019-09-12 DIAGNOSIS — R4189 Other symptoms and signs involving cognitive functions and awareness: Secondary | ICD-10-CM | POA: Diagnosis present

## 2019-09-12 DIAGNOSIS — K317 Polyp of stomach and duodenum: Secondary | ICD-10-CM

## 2019-09-12 DIAGNOSIS — K219 Gastro-esophageal reflux disease without esophagitis: Secondary | ICD-10-CM | POA: Diagnosis present

## 2019-09-12 DIAGNOSIS — K5521 Angiodysplasia of colon with hemorrhage: Secondary | ICD-10-CM | POA: Diagnosis not present

## 2019-09-12 DIAGNOSIS — R4182 Altered mental status, unspecified: Secondary | ICD-10-CM | POA: Diagnosis not present

## 2019-09-12 DIAGNOSIS — Z794 Long term (current) use of insulin: Secondary | ICD-10-CM | POA: Diagnosis not present

## 2019-09-12 DIAGNOSIS — E118 Type 2 diabetes mellitus with unspecified complications: Secondary | ICD-10-CM | POA: Diagnosis present

## 2019-09-12 DIAGNOSIS — Z888 Allergy status to other drugs, medicaments and biological substances status: Secondary | ICD-10-CM

## 2019-09-12 DIAGNOSIS — I2510 Atherosclerotic heart disease of native coronary artery without angina pectoris: Secondary | ICD-10-CM | POA: Diagnosis present

## 2019-09-12 DIAGNOSIS — Z7189 Other specified counseling: Secondary | ICD-10-CM | POA: Diagnosis not present

## 2019-09-12 DIAGNOSIS — I5042 Chronic combined systolic (congestive) and diastolic (congestive) heart failure: Secondary | ICD-10-CM | POA: Diagnosis present

## 2019-09-12 DIAGNOSIS — I272 Pulmonary hypertension, unspecified: Secondary | ICD-10-CM | POA: Diagnosis present

## 2019-09-12 DIAGNOSIS — Z03818 Encounter for observation for suspected exposure to other biological agents ruled out: Secondary | ICD-10-CM | POA: Diagnosis not present

## 2019-09-12 DIAGNOSIS — E11621 Type 2 diabetes mellitus with foot ulcer: Secondary | ICD-10-CM | POA: Diagnosis not present

## 2019-09-12 DIAGNOSIS — I10 Essential (primary) hypertension: Secondary | ICD-10-CM | POA: Diagnosis not present

## 2019-09-12 DIAGNOSIS — D631 Anemia in chronic kidney disease: Secondary | ICD-10-CM | POA: Diagnosis not present

## 2019-09-12 DIAGNOSIS — Z20822 Contact with and (suspected) exposure to covid-19: Secondary | ICD-10-CM | POA: Diagnosis not present

## 2019-09-12 DIAGNOSIS — Z83438 Family history of other disorder of lipoprotein metabolism and other lipidemia: Secondary | ICD-10-CM

## 2019-09-12 DIAGNOSIS — E11649 Type 2 diabetes mellitus with hypoglycemia without coma: Secondary | ICD-10-CM | POA: Diagnosis present

## 2019-09-12 DIAGNOSIS — I482 Chronic atrial fibrillation, unspecified: Secondary | ICD-10-CM | POA: Diagnosis not present

## 2019-09-12 DIAGNOSIS — T189XXA Foreign body of alimentary tract, part unspecified, initial encounter: Secondary | ICD-10-CM

## 2019-09-12 DIAGNOSIS — I5030 Unspecified diastolic (congestive) heart failure: Secondary | ICD-10-CM | POA: Diagnosis not present

## 2019-09-12 DIAGNOSIS — K449 Diaphragmatic hernia without obstruction or gangrene: Secondary | ICD-10-CM | POA: Diagnosis not present

## 2019-09-12 DIAGNOSIS — F411 Generalized anxiety disorder: Secondary | ICD-10-CM | POA: Diagnosis not present

## 2019-09-12 DIAGNOSIS — E1140 Type 2 diabetes mellitus with diabetic neuropathy, unspecified: Secondary | ICD-10-CM | POA: Diagnosis not present

## 2019-09-12 DIAGNOSIS — K2100 Gastro-esophageal reflux disease with esophagitis, without bleeding: Secondary | ICD-10-CM | POA: Diagnosis present

## 2019-09-12 DIAGNOSIS — Z515 Encounter for palliative care: Secondary | ICD-10-CM

## 2019-09-12 DIAGNOSIS — M869 Osteomyelitis, unspecified: Secondary | ICD-10-CM | POA: Diagnosis present

## 2019-09-12 DIAGNOSIS — G9341 Metabolic encephalopathy: Secondary | ICD-10-CM | POA: Diagnosis present

## 2019-09-12 DIAGNOSIS — Z89612 Acquired absence of left leg above knee: Secondary | ICD-10-CM

## 2019-09-12 DIAGNOSIS — N2581 Secondary hyperparathyroidism of renal origin: Secondary | ICD-10-CM | POA: Diagnosis present

## 2019-09-12 DIAGNOSIS — E079 Disorder of thyroid, unspecified: Secondary | ICD-10-CM | POA: Diagnosis present

## 2019-09-12 DIAGNOSIS — Z7902 Long term (current) use of antithrombotics/antiplatelets: Secondary | ICD-10-CM

## 2019-09-12 DIAGNOSIS — T50905A Adverse effect of unspecified drugs, medicaments and biological substances, initial encounter: Secondary | ICD-10-CM | POA: Diagnosis not present

## 2019-09-12 DIAGNOSIS — E1122 Type 2 diabetes mellitus with diabetic chronic kidney disease: Secondary | ICD-10-CM | POA: Diagnosis not present

## 2019-09-12 DIAGNOSIS — M1990 Unspecified osteoarthritis, unspecified site: Secondary | ICD-10-CM | POA: Diagnosis present

## 2019-09-12 DIAGNOSIS — Z803 Family history of malignant neoplasm of breast: Secondary | ICD-10-CM

## 2019-09-12 DIAGNOSIS — Z7689 Persons encountering health services in other specified circumstances: Secondary | ICD-10-CM | POA: Diagnosis not present

## 2019-09-12 DIAGNOSIS — K31819 Angiodysplasia of stomach and duodenum without bleeding: Secondary | ICD-10-CM | POA: Diagnosis not present

## 2019-09-12 DIAGNOSIS — D649 Anemia, unspecified: Secondary | ICD-10-CM

## 2019-09-12 DIAGNOSIS — L8990 Pressure ulcer of unspecified site, unspecified stage: Secondary | ICD-10-CM | POA: Diagnosis present

## 2019-09-12 DIAGNOSIS — K228 Other specified diseases of esophagus: Secondary | ICD-10-CM | POA: Diagnosis not present

## 2019-09-12 DIAGNOSIS — Z9861 Coronary angioplasty status: Secondary | ICD-10-CM

## 2019-09-12 DIAGNOSIS — Z89511 Acquired absence of right leg below knee: Secondary | ICD-10-CM

## 2019-09-12 DIAGNOSIS — M86472 Chronic osteomyelitis with draining sinus, left ankle and foot: Secondary | ICD-10-CM | POA: Diagnosis not present

## 2019-09-12 DIAGNOSIS — N189 Chronic kidney disease, unspecified: Secondary | ICD-10-CM | POA: Diagnosis present

## 2019-09-12 DIAGNOSIS — Z87891 Personal history of nicotine dependence: Secondary | ICD-10-CM

## 2019-09-12 DIAGNOSIS — Z9012 Acquired absence of left breast and nipple: Secondary | ICD-10-CM

## 2019-09-12 LAB — COMPREHENSIVE METABOLIC PANEL
ALT: 9 U/L (ref 0–44)
AST: 24 U/L (ref 15–41)
Albumin: 2.9 g/dL — ABNORMAL LOW (ref 3.5–5.0)
Alkaline Phosphatase: 51 U/L (ref 38–126)
Anion gap: 13 (ref 5–15)
BUN: 17 mg/dL (ref 8–23)
CO2: 25 mmol/L (ref 22–32)
Calcium: 8.3 mg/dL — ABNORMAL LOW (ref 8.9–10.3)
Chloride: 92 mmol/L — ABNORMAL LOW (ref 98–111)
Creatinine, Ser: 4.49 mg/dL — ABNORMAL HIGH (ref 0.44–1.00)
GFR calc Af Amer: 11 mL/min — ABNORMAL LOW (ref >60)
GFR calc non Af Amer: 9 mL/min — ABNORMAL LOW (ref >60)
Glucose, Bld: 79 mg/dL (ref 70–99)
Potassium: 3.8 mmol/L (ref 3.5–5.1)
Sodium: 130 mmol/L — ABNORMAL LOW (ref 135–145)
Total Bilirubin: 0.5 mg/dL (ref 0.3–1.2)
Total Protein: 8.2 g/dL — ABNORMAL HIGH (ref 6.5–8.1)

## 2019-09-12 LAB — CBG MONITORING, ED
Glucose-Capillary: <10 mg/dL — CL (ref 70–99)
Glucose-Capillary: 212 mg/dL — ABNORMAL HIGH (ref 70–99)
Glucose-Capillary: 86 mg/dL (ref 70–99)

## 2019-09-12 LAB — TYPE AND SCREEN
ABO/RH(D): A POS
Antibody Screen: NEGATIVE

## 2019-09-12 LAB — CBC WITH DIFFERENTIAL/PLATELET
Abs Immature Granulocytes: 0.02 10*3/uL (ref 0.00–0.07)
Basophils Absolute: 0 10*3/uL (ref 0.0–0.1)
Basophils Relative: 0 %
Eosinophils Absolute: 0 10*3/uL (ref 0.0–0.5)
Eosinophils Relative: 0 %
HCT: 31 % — ABNORMAL LOW (ref 36.0–46.0)
Hemoglobin: 9.9 g/dL — ABNORMAL LOW (ref 12.0–15.0)
Immature Granulocytes: 0 %
Lymphocytes Relative: 10 %
Lymphs Abs: 0.6 10*3/uL — ABNORMAL LOW (ref 0.7–4.0)
MCH: 26 pg (ref 26.0–34.0)
MCHC: 31.9 g/dL (ref 30.0–36.0)
MCV: 81.4 fL (ref 80.0–100.0)
Monocytes Absolute: 0.6 10*3/uL (ref 0.1–1.0)
Monocytes Relative: 10 %
Neutro Abs: 5.2 10*3/uL (ref 1.7–7.7)
Neutrophils Relative %: 80 %
Platelets: 238 10*3/uL (ref 150–400)
RBC: 3.81 MIL/uL — ABNORMAL LOW (ref 3.87–5.11)
RDW: 19.2 % — ABNORMAL HIGH (ref 11.5–15.5)
WBC: 6.5 10*3/uL (ref 4.0–10.5)
nRBC: 0 % (ref 0.0–0.2)

## 2019-09-12 LAB — POC OCCULT BLOOD, ED: Fecal Occult Bld: POSITIVE — AB

## 2019-09-12 LAB — RESPIRATORY PANEL BY RT PCR (FLU A&B, COVID)
Influenza A by PCR: NEGATIVE
Influenza B by PCR: NEGATIVE
SARS Coronavirus 2 by RT PCR: NEGATIVE

## 2019-09-12 MED ORDER — DEXTROSE 50 % IV SOLN
50.0000 mL | Freq: Once | INTRAVENOUS | Status: AC
  Administered 2019-09-12: 18:00:00 50 mL via INTRAVENOUS
  Filled 2019-09-12: qty 50

## 2019-09-12 MED ORDER — DEXTROSE 50 % IV SOLN
50.0000 mL | Freq: Once | INTRAVENOUS | Status: AC
  Administered 2019-09-12: 50 mL via INTRAVENOUS

## 2019-09-12 MED ORDER — SODIUM CHLORIDE 0.9 % IV BOLUS
500.0000 mL | Freq: Once | INTRAVENOUS | Status: AC
  Administered 2019-09-12: 19:00:00 500 mL via INTRAVENOUS

## 2019-09-12 MED ORDER — PANTOPRAZOLE SODIUM 40 MG IV SOLR
40.0000 mg | Freq: Once | INTRAVENOUS | Status: AC
  Administered 2019-09-12: 18:00:00 40 mg via INTRAVENOUS
  Filled 2019-09-12: qty 40

## 2019-09-12 NOTE — ED Triage Notes (Signed)
[facility] staff reports they dropped her off at dialysis today and pt was normal.  Reports when they picked her up, pt had decreased responsiveness and was cool and clammy per staff.  [facility] staff called their DON and was told to bring pt to er for eval.  When pt arrived, pt was sitting in the [place] in wheelchair drooling responsive to painful stimuli.

## 2019-09-12 NOTE — ED Notes (Signed)
Pt more alert after 2 amps of d50.  Pt alert and oriented to place and self.  Denies any pain.  Pt cool to touch.  Black stool noted when pt was cleaned.  EDP aware and will hemocult.

## 2019-09-12 NOTE — ED Notes (Signed)
Pt is alert and oriented x 4.  Following commands.  Pt says is remembers having bloody stool before going to dialysis. Denies any pain at this time.

## 2019-09-12 NOTE — Telephone Encounter (Signed)
Trivia from [facility] called.   They need to know if the appointment for tomorrow was made in error or if its needed.   Call back: [phone]

## 2019-09-12 NOTE — ED Notes (Signed)
Feeding pt dinner tray, tolerating well.

## 2019-09-12 NOTE — Telephone Encounter (Signed)
Called and sw trivia to advise that this was an old appt that she does not need to come in tomorrow and that her next follow up is on 09/17/19.

## 2019-09-13 ENCOUNTER — Encounter (HOSPITAL_COMMUNITY): Payer: Self-pay | Admitting: Family Medicine

## 2019-09-13 ENCOUNTER — Ambulatory Visit: Payer: Medicare Other | Admitting: Physician Assistant

## 2019-09-13 ENCOUNTER — Other Ambulatory Visit: Payer: Self-pay

## 2019-09-13 DIAGNOSIS — K922 Gastrointestinal hemorrhage, unspecified: Secondary | ICD-10-CM

## 2019-09-13 DIAGNOSIS — I4891 Unspecified atrial fibrillation: Secondary | ICD-10-CM

## 2019-09-13 DIAGNOSIS — Z89612 Acquired absence of left leg above knee: Secondary | ICD-10-CM | POA: Diagnosis not present

## 2019-09-13 DIAGNOSIS — K921 Melena: Secondary | ICD-10-CM | POA: Diagnosis not present

## 2019-09-13 DIAGNOSIS — Z992 Dependence on renal dialysis: Secondary | ICD-10-CM | POA: Diagnosis not present

## 2019-09-13 DIAGNOSIS — N186 End stage renal disease: Secondary | ICD-10-CM | POA: Diagnosis not present

## 2019-09-13 LAB — CBC
HCT: 28.3 % — ABNORMAL LOW (ref 36.0–46.0)
HCT: 27 % — ABNORMAL LOW (ref 36.0–46.0)
Hemoglobin: 9 g/dL — ABNORMAL LOW (ref 12.0–15.0)
Hemoglobin: 8.6 g/dL — ABNORMAL LOW (ref 12.0–15.0)
MCH: 26.2 pg (ref 26.0–34.0)
MCH: 26.1 pg (ref 26.0–34.0)
MCHC: 31.8 g/dL (ref 30.0–36.0)
MCHC: 31.9 g/dL (ref 30.0–36.0)
MCV: 82.3 fL (ref 80.0–100.0)
MCV: 82.1 fL (ref 80.0–100.0)
Platelets: 215 10*3/uL (ref 150–400)
Platelets: 205 10*3/uL (ref 150–400)
RBC: 3.44 MIL/uL — ABNORMAL LOW (ref 3.87–5.11)
RBC: 3.29 MIL/uL — ABNORMAL LOW (ref 3.87–5.11)
RDW: 19.7 % — ABNORMAL HIGH (ref 11.5–15.5)
RDW: 19.3 % — ABNORMAL HIGH (ref 11.5–15.5)
WBC: 7.5 10*3/uL (ref 4.0–10.5)
WBC: 7.3 10*3/uL (ref 4.0–10.5)
nRBC: 0 % (ref 0.0–0.2)
nRBC: 0 % (ref 0.0–0.2)

## 2019-09-13 LAB — COMPREHENSIVE METABOLIC PANEL
ALT: 8 U/L (ref 0–44)
AST: 19 U/L (ref 15–41)
Albumin: 3 g/dL — ABNORMAL LOW (ref 3.5–5.0)
Alkaline Phosphatase: 47 U/L (ref 38–126)
Anion gap: 17 — ABNORMAL HIGH (ref 5–15)
BUN: 20 mg/dL (ref 8–23)
CO2: 23 mmol/L (ref 22–32)
Calcium: 8.9 mg/dL (ref 8.9–10.3)
Chloride: 95 mmol/L — ABNORMAL LOW (ref 98–111)
Creatinine, Ser: 5.15 mg/dL — ABNORMAL HIGH (ref 0.44–1.00)
GFR calc Af Amer: 9 mL/min — ABNORMAL LOW (ref >60)
GFR calc non Af Amer: 8 mL/min — ABNORMAL LOW (ref >60)
Glucose, Bld: 98 mg/dL (ref 70–99)
Potassium: 3.7 mmol/L (ref 3.5–5.1)
Sodium: 135 mmol/L (ref 135–145)
Total Bilirubin: 0.9 mg/dL (ref 0.3–1.2)
Total Protein: 7.3 g/dL (ref 6.5–8.1)

## 2019-09-13 LAB — GLUCOSE, CAPILLARY
Glucose-Capillary: 56 mg/dL — ABNORMAL LOW (ref 70–99)
Glucose-Capillary: 88 mg/dL (ref 70–99)
Glucose-Capillary: 77 mg/dL (ref 70–99)
Glucose-Capillary: 91 mg/dL (ref 70–99)
Glucose-Capillary: 134 mg/dL — ABNORMAL HIGH (ref 70–99)
Glucose-Capillary: 143 mg/dL — ABNORMAL HIGH (ref 70–99)
Glucose-Capillary: 135 mg/dL — ABNORMAL HIGH (ref 70–99)
Glucose-Capillary: 118 mg/dL — ABNORMAL HIGH (ref 70–99)

## 2019-09-13 LAB — HEMOGLOBIN A1C
Hgb A1c MFr Bld: 5.7 % — ABNORMAL HIGH (ref 4.8–5.6)
Mean Plasma Glucose: 116.89 mg/dL

## 2019-09-13 MED ORDER — SODIUM CHLORIDE 0.9 % IV BOLUS: 500.0000 mL | Freq: Once | INTRAVENOUS | Status: DC

## 2019-09-13 MED ORDER — ATORVASTATIN CALCIUM 40 MG PO TABS
80.0000 mg | ORAL_TABLET | Freq: Every day | ORAL | Status: DC
  Administered 2019-09-13 – 2019-09-25 (×9): 80 mg via ORAL
  Filled 2019-09-13 (×12): qty 2

## 2019-09-13 MED ORDER — AMIODARONE HCL IN DEXTROSE 360-4.14 MG/200ML-% IV SOLN
60.0000 mg/h | INTRAVENOUS | Status: AC
  Administered 2019-09-14: 60 mg/h via INTRAVENOUS

## 2019-09-13 MED ORDER — AMIODARONE LOAD VIA INFUSION
150.0000 mg | Freq: Once | INTRAVENOUS | Status: AC
  Administered 2019-09-14: 150 mg via INTRAVENOUS
  Filled 2019-09-13: qty 83.34

## 2019-09-13 MED ORDER — INSULIN ASPART 100 UNIT/ML ~~LOC~~ SOLN
0.0000 [IU] | SUBCUTANEOUS | Status: DC
  Administered 2019-09-13 – 2019-09-14 (×3): 1 [IU] via SUBCUTANEOUS

## 2019-09-13 MED ORDER — MIDODRINE HCL 5 MG PO TABS
5.0000 mg | ORAL_TABLET | Freq: Three times a day (TID) | ORAL | Status: DC
  Administered 2019-09-13 – 2019-09-14 (×4): 5 mg via ORAL
  Filled 2019-09-13 (×8): qty 1

## 2019-09-13 MED ORDER — SODIUM CHLORIDE 0.9 % IV SOLN
250.0000 mL | INTRAVENOUS | Status: DC | PRN
  Administered 2019-09-14: 250 mL via INTRAVENOUS

## 2019-09-13 MED ORDER — GLUCOSE 40 % PO GEL
ORAL | Status: AC
  Filled 2019-09-13: qty 1

## 2019-09-13 MED ORDER — ONDANSETRON HCL 4 MG/2ML IJ SOLN
4.0000 mg | Freq: Four times a day (QID) | INTRAMUSCULAR | Status: DC | PRN
  Administered 2019-09-14 – 2019-09-15 (×2): 4 mg via INTRAVENOUS
  Filled 2019-09-13 (×2): qty 2

## 2019-09-13 MED ORDER — ACETAMINOPHEN 325 MG PO TABS
650.0000 mg | ORAL_TABLET | Freq: Four times a day (QID) | ORAL | Status: DC | PRN
  Filled 2019-09-13: qty 2

## 2019-09-13 MED ORDER — SODIUM CHLORIDE 0.9% FLUSH
3.0000 mL | Freq: Two times a day (BID) | INTRAVENOUS | Status: DC
  Administered 2019-09-13 – 2019-09-20 (×10): 3 mL via INTRAVENOUS

## 2019-09-13 MED ORDER — CHLORHEXIDINE GLUCONATE CLOTH 2 % EX PADS
6.0000 | MEDICATED_PAD | Freq: Every day | CUTANEOUS | Status: DC
  Administered 2019-09-13 – 2019-09-25 (×9): 6 via TOPICAL

## 2019-09-13 MED ORDER — SODIUM CHLORIDE 0.9% FLUSH: 3.0000 mL | INTRAVENOUS | Status: DC | PRN

## 2019-09-13 MED ORDER — ALBUTEROL SULFATE (2.5 MG/3ML) 0.083% IN NEBU: 3.0000 mL | INHALATION_SOLUTION | Freq: Four times a day (QID) | RESPIRATORY_TRACT | Status: DC | PRN

## 2019-09-13 MED ORDER — ONDANSETRON HCL 4 MG PO TABS: 4.0000 mg | ORAL_TABLET | Freq: Four times a day (QID) | ORAL | Status: DC | PRN

## 2019-09-13 MED ORDER — DOXYCYCLINE HYCLATE 100 MG PO TABS
100.0000 mg | ORAL_TABLET | Freq: Two times a day (BID) | ORAL | Status: DC
  Administered 2019-09-13 – 2019-09-19 (×10): 100 mg via ORAL
  Filled 2019-09-13 (×13): qty 1

## 2019-09-13 MED ORDER — PANTOPRAZOLE SODIUM 40 MG IV SOLR
40.0000 mg | Freq: Two times a day (BID) | INTRAVENOUS | Status: DC
  Administered 2019-09-13 – 2019-09-16 (×5): 40 mg via INTRAVENOUS
  Filled 2019-09-13 (×5): qty 40

## 2019-09-13 MED ORDER — DEXTROSE 50 % IV SOLN
12.5000 g | INTRAVENOUS | Status: AC
  Administered 2019-09-13: 12.5 g via INTRAVENOUS
  Filled 2019-09-13: qty 50

## 2019-09-13 MED ORDER — CLOPIDOGREL 5 MG/ML PEDIATRIC ORAL SUSPENSION: 75.0000 mg | Freq: Every morning | ORAL | Status: DC

## 2019-09-13 MED ORDER — SEVELAMER CARBONATE 800 MG PO TABS
1600.0000 mg | ORAL_TABLET | Freq: Three times a day (TID) | ORAL | Status: DC
  Administered 2019-09-13 – 2019-09-17 (×7): 1600 mg via ORAL
  Filled 2019-09-13 (×10): qty 2

## 2019-09-13 MED ORDER — AMIODARONE HCL IN DEXTROSE 360-4.14 MG/200ML-% IV SOLN
30.0000 mg/h | INTRAVENOUS | Status: DC
  Administered 2019-09-14 – 2019-09-15 (×3): 30 mg/h via INTRAVENOUS
  Filled 2019-09-13 (×4): qty 200

## 2019-09-13 MED ORDER — CLOPIDOGREL BISULFATE 75 MG PO TABS
75.0000 mg | ORAL_TABLET | Freq: Every day | ORAL | Status: DC
  Administered 2019-09-13 – 2019-09-25 (×9): 75 mg via ORAL
  Filled 2019-09-13 (×12): qty 1

## 2019-09-13 MED ORDER — ACETAMINOPHEN 650 MG RE SUPP: 650.0000 mg | Freq: Four times a day (QID) | RECTAL | Status: DC | PRN

## 2019-09-13 MED ORDER — AMIODARONE HCL 200 MG PO TABS
200.0000 mg | ORAL_TABLET | Freq: Every day | ORAL | Status: DC
  Administered 2019-09-13: 14:00:00 200 mg via ORAL
  Filled 2019-09-13: qty 1

## 2019-09-13 MED ORDER — ASPIRIN EC 81 MG PO TBEC
81.0000 mg | DELAYED_RELEASE_TABLET | Freq: Every day | ORAL | Status: DC
  Administered 2019-09-13 – 2019-09-17 (×3): 81 mg via ORAL
  Filled 2019-09-13 (×4): qty 1

## 2019-09-13 NOTE — H&P (Signed)
TRH H&P    Patient Demographics:    Alexis [name], is a 74 y.o. female  MRN: [id number]  DOB - [birth year]  Admit Date - 09/12/2019  Referring MD/NP/PA: Dr. Estell Harpin  Outpatient Primary MD for the patient is System, Provider Not In  Patient coming from: Home  Chief complaint-altered mental status   HPI:    Alexis [name]  is a 74 y.o. female, with history of atrial fibrillation on anticoagulation with Eliquis, ESRD on HD TTS, CAD, pulmonary hypertension, hypertension, hyperlipidemia, type 2 diabetes mellitus, diabetic neuropathy, CVA who was recently discharged from the hospital on 08/31/2019 after she had left [facility] for calcaneal osteomyelitis, patient was discharged on oral doxycycline for right heel ulcer with stop date on Sep 18, 2019.  She also had right leg drug-eluting stent placed per vascular surgery.  Patient was started on dual antiplatelet therapy.  Today she was at hemodialysis where she was noted to have decreased responsiveness and was cool and clammy as per staff.  She was brought to the ED for further evaluation.  Patient was found to have severe hypoglycemia with blood glucose of less than 10.  She received 2 A of D50 and glucose returned to normal.  Patient became more alert and in now she is oriented x4.   Patient was hypotensive, stool guaiac was positive.  Her hemoglobin is at baseline 9.9.  GI was consulted by ED physician and recommended to keep her n.p.o. for possible EGD in a.m.  Patient denies black-colored stool. Denies vomiting any blood. Denies nausea vomiting or diarrhea. Denies abdominal pain    Review of systems:    In addition to the HPI above,    All other systems reviewed and are negative.    Past History of the following :    Past Medical History:  Diagnosis Date  . Arthritis   . Cancer [facility]) 2005    left breast  . CHF (congestive heart failure) (HCC)   . Chronic back  pain   . Chronic kidney disease 03/2014   dialysis t/th/sa  . Coronary artery disease   . Diabetes mellitus    Type 2  . Diabetic nephropathy (HCC) 01-08-13  . Dyslipidemia 01-08-13  . ESRD on hemodialysis (HCC)    Tu, Th, Sat  . Headache   . Hyperlipidemia   . Hypertension   . LBBB (left bundle branch block)   . Proteinuria 01-08-13  . Pulmonary hypertension (HCC)    PA peak pressure 73 mmHg 08/05/17 echo  . Thyroid disease 01-08-13   Hyper-parathyroidism-secondary  . Wears glasses       Past Surgical History:  Procedure Laterality Date  . A/V FISTULAGRAM N/A 08/25/2017   Procedure: A/V FISTULAGRAM - Left Arm;  Surgeon: Chuck Hint, MD;  Location: [facility] INVASIVE CV LAB;  Service: Cardiovascular;  Laterality: N/A;  . A/V FISTULAGRAM N/A 02/09/2018   Procedure: A/V [id number];  Surgeon: Sherren Kerns, MD;  Location: [facility] INVASIVE CV LAB;  Service: Cardiovascular;  Laterality: N/A;  . A/V FISTULAGRAM Left 04/04/2018   Procedure:  A/V FISTULAGRAM;  Surgeon: Cephus Shelling, MD;  Location: [facility] INVASIVE CV LAB;  Service: Cardiovascular;  Laterality: Left;  . A/V FISTULAGRAM Right 07/22/2019   Procedure: A/V FISTULAGRAM - Right Arm;  Surgeon: Maeola Harman, MD;  Location: [facility] INVASIVE CV LAB;  Service: Cardiovascular;  Laterality: Right;  . ABDOMINAL AORTAGRAM N/A 08/11/2014   Procedure: ABDOMINAL Ronny Flurry;  Surgeon: Chuck Hint, MD;  Location: [facility] CATH LAB;  Service: Cardiovascular;  Laterality: N/A;  . ABDOMINAL AORTOGRAM W/LOWER EXTREMITY N/A 08/22/2019   Procedure: ABDOMINAL AORTOGRAM W/LOWER EXTREMITY;  Surgeon: Nada Libman, MD;  Location: [facility] INVASIVE CV LAB;  Service: Cardiovascular;  Laterality: N/A;  . ABDOMINAL HYSTERECTOMY    . AMPUTATION Left 08/25/2019   Procedure: LEFT AMPUTATION ABOVE KNEE;  Surgeon: Nadara Mustard, MD;  Location: [facility] OR;  Service: Orthopedics;  Laterality: Left;  . APPENDECTOMY    . Arm surgery     Left arm trauma  . AV  FISTULA PLACEMENT Left 08/21/2014   Procedure: LEFT ARM ARTERIOVENOUS (AV) FISTULA CREATION ;  Surgeon: Chuck Hint, MD;  Location: [facility] OR;  Service: Vascular;  Laterality: Left;  . AV FISTULA PLACEMENT Right 06/13/2018   Procedure: insertion of right arm ARTERIOVENOUS (AV) gore-tex GRAFT;  Surgeon: Larina Earthly, MD;  Location: [facility] OR;  Service: Vascular;  Laterality: Right;  . BACK SURGERY    . CATARACT EXTRACTION W/PHACO Left 12/22/2014   Procedure: CATARACT EXTRACTION PHACO AND INTRAOCULAR LENS PLACEMENT (IOC);  Surgeon: Gemma Payor, MD;  Location: [facility] ORS;  Service: Ophthalmology;  Laterality: Left;  CDE 13.48  . CORONARY ANGIOPLASTY  12/19/2003   inferior wall hypokinesis. ef 50%  . dialysis catheter    . INSERTION OF DIALYSIS CATHETER Right 06/13/2018   Procedure: INSERTION OF DIALYSIS CATHETER, right internal jugular;  Surgeon: Larina Earthly, MD;  Location: [facility] OR;  Service: Vascular;  Laterality: Right;  . IR FLUORO GUIDE CV LINE RIGHT  04/06/2018  . IR REMOVAL TUN CV CATH W/O FL  04/25/2018  . IR REMOVAL TUN CV CATH W/O FL  07/20/2018  . IR US GUIDE VASC ACCESS RIGHT  04/06/2018  . LIGATION OF ARTERIOVENOUS  FISTULA Left 03/18/2019   Procedure: LIGATION OF ARTERIOVENOUS  FISTULA  LEFT ARM;  Surgeon: Chuck Hint, MD;  Location: [facility] OR;  Service: Vascular;  Laterality: Left;  Marland Kitchen MASTECTOMY     Left  . PERIPHERAL VASCULAR BALLOON ANGIOPLASTY Left 08/25/2017   Procedure: PERIPHERAL VASCULAR BALLOON ANGIOPLASTY;  Surgeon: Chuck Hint, MD;  Location: [facility] INVASIVE CV LAB;  Service: Cardiovascular;  Laterality: Left;  arm fistula  . PERIPHERAL VASCULAR BALLOON ANGIOPLASTY Left 02/09/2018   Procedure: PERIPHERAL VASCULAR BALLOON ANGIOPLASTY;  Surgeon: Sherren Kerns, MD;  Location: [facility] INVASIVE CV LAB;  Service: Cardiovascular;  Laterality: Left;  AV Fistula  . PERIPHERAL VASCULAR BALLOON ANGIOPLASTY Left 04/04/2018   Procedure: PERIPHERAL VASCULAR BALLOON ANGIOPLASTY;   Surgeon: Cephus Shelling, MD;  Location: [facility] INVASIVE CV LAB;  Service: Cardiovascular;  Laterality: Left;  UPPER ARM FISTULA  . PERIPHERAL VASCULAR CATHETERIZATION N/A 09/16/2015   Procedure: Fistulagram;  Surgeon: Larina Earthly, MD;  Location: [facility] INVASIVE CV LAB;  Service: Cardiovascular;  Laterality: N/A;  . PERIPHERAL VASCULAR INTERVENTION  08/22/2019   Procedure: PERIPHERAL VASCULAR INTERVENTION;  Surgeon: Nada Libman, MD;  Location: [facility] INVASIVE CV LAB;  Service: Cardiovascular;;  Rt. SFA and Popliteal w/Shockwavw treatment  . SPINE SURGERY    . TONSILLECTOMY    . TRANSTHORACIC  ECHOCARDIOGRAM  10/01/2010    Left ventricle: The cavity size was mildly dilated. Wall thickness was increased in a pattern of mild LVH. Systolic function was   mildly reduced. The estimated ejection fraction was in the range  of 45% to 50%.   . TUBAL LIGATION        Social History:      Social History   Tobacco Use  . Smoking status: Former Smoker    Packs/day: 1.00    Years: 50.00    Pack years: 50.00    Types: Cigarettes    Quit date: 05/16/1998    Years since quitting: 21.3  . Smokeless tobacco: Never Used  Substance Use Topics  . Alcohol use: No    Alcohol/week: 0.0 standard drinks       Family History :     Family History  Problem Relation Age of Onset  . Breast cancer Mother        Died age 41  . Cancer Mother 70       Breast  . Heart disease Mother   . Hyperlipidemia Mother   . Hypertension Mother   . Heart attack Mother       Home Medications:   Prior to Admission medications   Medication Sig Start Date End Date Taking? Authorizing Provider  albuterol (VENTOLIN HFA) 108 (90 Base) MCG/ACT inhaler Inhale 1-2 puffs into the lungs every 6 (six) hours as needed for wheezing or shortness of breath.   Yes [provider]  amiodarone (PACERONE) 200 MG tablet Take 1 tablet (200 mg total) by mouth daily. Patient taking differently: Take 200 mg by mouth daily. (0900) 05/30/19   Yes Strader, [place] M, PA-C  apixaban (ELIQUIS) 2.5 MG TABS tablet Take 1 tablet (2.5 mg total) by mouth 2 (two) times daily. Patient taking differently: Take 2.5 mg by mouth 2 (two) times daily. 0900 & 2100 06/13/19 07/17/20 Yes Shah, Pratik D, DO  aspirin EC 81 MG EC tablet Take 1 tablet (81 mg total) by mouth daily. 08/31/19  Yes Leroy Sea, MD  atorvastatin (LIPITOR) 80 MG tablet Take 1 tablet (80 mg total) by mouth daily. 05/30/19  Yes Strader, [place] M, PA-C  calcitRIOL (ROCALTROL) 0.25 MCG capsule Take 0.25 mcg by mouth Every Tuesday,Thursday,and Saturday with dialysis. (1700) 03/26/14  Yes [provider]  clopidogrel (PLAVIX) 75 MG tablet Take 1 tablet (75 mg total) by mouth daily with breakfast. 08/31/19  Yes Leroy Sea, MD  insulin aspart (NOVOLOG) 100 UNIT/ML injection Substitute to any brand approved.Before each meal 3 times a day, 140-199 - 2 units, 200-250 - 4 units, 251-299 - 6 units,  300-349 - 8 units,  350 or above 10 units. Dispense syringes and needles as needed, Ok to switch to PEN if approved. DX DM2, Code E11.65 08/31/19  Yes Leroy Sea, MD  midodrine (PROAMATINE) 5 MG tablet Take 1 tablet (5 mg total) by mouth 3 (three) times daily with meals. 05/30/19  Yes Strader, [place] M, PA-C  multivitamin (RENA-VIT) TABS tablet Take 1 tablet by mouth at bedtime. 08/31/19  Yes Leroy Sea, MD  NOVOLOG MIX 70/30 FLEXPEN (70-30) 100 UNIT/ML FlexPen Inject 25-30 Units into the skin See admin instructions. 30 units in the morning & 25 units at night 06/05/19  Yes [provider]  pantoprazole (PROTONIX) 40 MG tablet Take 1 tablet (40 mg total) by mouth daily. 08/31/19  Yes Leroy Sea, MD  sevelamer carbonate (RENVELA) 800 MG tablet Take 1,600  mg by mouth 3 (three) times daily with meals.   Yes [provider]  diclofenac Sodium (VOLTAREN) 1 % GEL Apply 2 g topically 4 (four) times daily as needed for pain. 06/26/19   [provider]  docusate sodium (COLACE) 100 MG capsule Take 100 mg by mouth every other day. Hold for loose stools    [provider]  doxycycline (VIBRA-TABS) 100 MG tablet Take 1 tablet (100 mg total) by mouth every 12 (twelve) hours for 19 days. 08/31/19 09/19/19  Leroy Sea, MD  lidocaine-prilocaine (EMLA) cream Apply 1 application topically Every Tuesday,Thursday,and Saturday with dialysis. 1 hour prior to dialysis    [provider]  nitroGLYCERIN (NITROSTAT) 0.4 MG SL tablet Place 0.4 mg under the tongue every 5 (five) minutes x 3 doses as needed for chest pain.     [provider]  ondansetron (ZOFRAN-ODT) 4 MG disintegrating tablet Take 4 mg by mouth every 6 (six) hours as needed for nausea or vomiting.  05/18/19   [provider]  oxyCODONE (OXY IR/ROXICODONE) 5 MG immediate release tablet Take 1 tablet (5 mg total) by mouth every 6 (six) hours as needed for moderate pain or severe pain (pain score 4-6). 08/31/19   Leroy Sea, MD  polyethylene glycol (MIRALAX / GLYCOLAX) packet Take 17 g by mouth daily as needed for mild constipation.     [provider]     Allergies:     Allergies  Allergen Reactions  . Olmesartan Other (See Comments)    Hyperkalemia      Physical Exam:   Vitals  Blood pressure (!) 134/101, pulse (!) 108, temperature (!) 97.1 F (36.2 C), temperature source Rectal, resp. rate 18, height 5\' 6"  (1.676 m), weight 85 kg, SpO2 100 %.  1.  General: Appears in no acute distress  2. Psychiatric: Alert, oriented x4, intact insight and judgment  3. Neurologic: Cranial nerves II through XII grossly intact, no focal deficit noted  4. HEENMT:  Atraumatic normocephalic, extraocular's are intact  5. Respiratory : Clear to auscultation bilaterally, no wheezing or crackles auscultated  6. Cardiovascular : S1-S2, regular  7. Gastrointestinal:  Abdomen is soft, nontender, no organomegaly  8. Skin:  No rashes  noted  9.Musculoskeletal:  S/p left AKA    Data Review:    CBC Recent Labs  Lab 09/12/19 2026  WBC 6.5  HGB 9.9*  HCT 31.0*  PLT 238  MCV 81.4  MCH 26.0  MCHC 31.9  RDW 19.2*  LYMPHSABS 0.6*  MONOABS 0.6  EOSABS 0.0  BASOSABS 0.0   ------------------------------------------------------------------------------------------------------------------  Results for orders placed or performed during the hospital encounter of 09/12/19 (from the past 48 hour(s))  CBG monitoring, ED     Status: Abnormal   Collection Time: 09/12/19  5:21 PM  Result Value Ref Range   Glucose-Capillary <10 (LL) 70 - 99 mg/dL    Comment: Glucose reference range applies only to samples taken after fasting for at least 8 hours.  POC occult blood, ED     Status: Abnormal   Collection Time: 09/12/19  5:41 PM  Result Value Ref Range   Fecal Occult Bld POSITIVE (A) NEGATIVE  CBG monitoring, ED     Status: Abnormal   Collection Time: 09/12/19  5:54 PM  Result Value Ref Range   Glucose-Capillary 212 (H) 70 - 99 mg/dL    Comment: Glucose reference range applies only to samples taken after fasting for at least 8 hours.  Comment 1 Notify RN   Type and screen     Status: None   Collection Time: 09/12/19  7:43 PM  Result Value Ref Range   ABO/RH(D) A POS    Antibody Screen NEG    Sample Expiration      09/15/2019,2359 Performed at [facility], [address] [place], [place] [address]   CBG monitoring, ED     Status: None   Collection Time: 09/12/19  7:56 PM  Result Value Ref Range   Glucose-Capillary 86 70 - 99 mg/dL    Comment: Glucose reference range applies only to samples taken after fasting for at least 8 hours.  CBC with Differential/Platelet     Status: Abnormal   Collection Time: 09/12/19  8:26 PM  Result Value Ref Range   WBC 6.5 4.0 - 10.5 K/uL   RBC 3.81 (L) 3.87 - 5.11 MIL/uL   Hemoglobin 9.9 (L) 12.0 - 15.0 g/dL   HCT 60.4 (L) 54.0 - 98.1 %   MCV 81.4 80.0 - 100.0 fL   MCH 26.0  26.0 - 34.0 pg   MCHC 31.9 30.0 - 36.0 g/dL   RDW 19.1 (H) 47.8 - 29.5 %   Platelets 238 150 - 400 K/uL   nRBC 0.0 0.0 - 0.2 %   Neutrophils Relative % 80 %   Neutro Abs 5.2 1.7 - 7.7 K/uL   Lymphocytes Relative 10 %   Lymphs Abs 0.6 (L) 0.7 - 4.0 K/uL   Monocytes Relative 10 %   Monocytes Absolute 0.6 0.1 - 1.0 K/uL   Eosinophils Relative 0 %   Eosinophils Absolute 0.0 0.0 - 0.5 K/uL   Basophils Relative 0 %   Basophils Absolute 0.0 0.0 - 0.1 K/uL   Immature Granulocytes 0 %   Abs Immature Granulocytes 0.02 0.00 - 0.07 K/uL    Comment: Performed at [facility], [address], [place], [place] [address]  Comprehensive metabolic panel     Status: Abnormal   Collection Time: 09/12/19  8:26 PM  Result Value Ref Range   Sodium 130 (L) 135 - 145 mmol/L   Potassium 3.8 3.5 - 5.1 mmol/L   Chloride 92 (L) 98 - 111 mmol/L   CO2 25 22 - 32 mmol/L   Glucose, Bld 79 70 - 99 mg/dL    Comment: Glucose reference range applies only to samples taken after fasting for at least 8 hours.   BUN 17 8 - 23 mg/dL   Creatinine, Ser 8.65 (H) 0.44 - 1.00 mg/dL   Calcium 8.3 (L) 8.9 - 10.3 mg/dL   Total Protein 8.2 (H) 6.5 - 8.1 g/dL   Albumin 2.9 (L) 3.5 - 5.0 g/dL   AST 24 15 - 41 U/L   ALT 9 0 - 44 U/L   Alkaline Phosphatase 51 38 - 126 U/L   Total Bilirubin 0.5 0.3 - 1.2 mg/dL   GFR calc non Af Amer 9 (L) >60 mL/min   GFR calc Af Amer 11 (L) >60 mL/min   Anion gap 13 5 - 15    Comment: Performed at [facility], [address], [place], [place] [address]  Respiratory Panel by RT PCR (Flu A&B, Covid) - Nasopharyngeal Swab     Status: None   Collection Time: 09/12/19 10:19 PM   Specimen: Nasopharyngeal Swab  Result Value Ref Range   SARS Coronavirus 2 by RT PCR NEGATIVE NEGATIVE    Comment: (NOTE) SARS-CoV-2 target nucleic acids are NOT DETECTED. The SARS-CoV-2 RNA is generally detectable  in upper respiratoy specimens during the acute phase of infection. The lowest concentration of SARS-CoV-2  viral copies this assay can detect is 131 copies/mL. A negative result does not preclude SARS-Cov-2 infection and should not be used as the sole basis for treatment or other patient management decisions. A negative result may occur with  improper specimen collection/handling, submission of specimen other than nasopharyngeal swab, presence of viral mutation(s) within the areas targeted by this assay, and inadequate number of viral copies (<131 copies/mL). A negative result must be combined with clinical observations, patient history, and epidemiological information. The expected result is Negative. Fact Sheet for Patients:  https://www.moore.com/ Fact Sheet for Healthcare Providers:  https://www.young.biz/ This test is not yet ap proved or cleared by the [place] FDA and  has been authorized for detection and/or diagnosis of SARS-CoV-2 by FDA under an Emergency Use Authorization (EUA). This EUA will remain  in effect (meaning this test can be used) for the duration of the COVID-19 declaration under Section 564(b)(1) of the Act, 21 U.S.C. section 360bbb-3(b)(1), unless the authorization is terminated or revoked sooner.    Influenza A by PCR NEGATIVE NEGATIVE   Influenza B by PCR NEGATIVE NEGATIVE    Comment: (NOTE) The Xpert Xpress SARS-CoV-2/FLU/RSV assay is intended as an aid in  the diagnosis of influenza from Nasopharyngeal swab specimens and  should not be used as a sole basis for treatment. Nasal washings and  aspirates are unacceptable for Xpert Xpress SARS-CoV-2/FLU/RSV  testing. Fact Sheet for Patients: https://www.moore.com/ Fact Sheet for Healthcare Providers: https://www.young.biz/ This test is not yet approved or cleared by the [place] FDA and  has been authorized for detection and/or diagnosis of SARS-CoV-2 by  FDA under an Emergency Use Authorization (EUA). This EUA will remain  in  effect (meaning this test can be used) for the duration of the  Covid-19 declaration under Section 564(b)(1) of the Act, 21  U.S.C. section 360bbb-3(b)(1), unless the authorization is  terminated or revoked. Performed at [facility], [address], [place], [place] [address]     Chemistries  Recent Labs  Lab 09/12/19 2026  NA 130*  K 3.8  CL 92*  CO2 25  GLUCOSE 79  BUN 17  CREATININE 4.49*  CALCIUM 8.3*  AST 24  ALT 9  ALKPHOS 51  BILITOT 0.5   ------------------------------------------------------------------------------------------------------------------  ------------------------------------------------------------------------------------------------------------------ GFR: Estimated Creatinine Clearance: 12.3 mL/min (A) (by C-G formula based on SCr of 4.49 mg/dL (H)). Liver Function Tests: Recent Labs  Lab 09/12/19 2026  AST 24  ALT 9  ALKPHOS 51  BILITOT 0.5  PROT 8.2*  ALBUMIN 2.9*   CBG: Recent Labs  Lab 09/12/19 1721 09/12/19 1754 09/12/19 1956  GLUCAP <10* 212* 86    --------------------------------------------------------------------------------------------------------------- Urine analysis:    Component Value Date/Time   COLORURINE YELLOW 08/19/2012 1126   APPEARANCEUR CLOUDY (A) 08/19/2012 1126   LABSPEC 1.018 08/19/2012 1126   PHURINE 5.0 08/19/2012 1126   GLUCOSEU NEGATIVE 08/19/2012 1126   HGBUR SMALL (A) 08/19/2012 1126   BILIRUBINUR NEGATIVE 08/19/2012 1126   KETONESUR NEGATIVE 08/19/2012 1126   PROTEINUR >300 (A) 08/19/2012 1126   UROBILINOGEN 0.2 08/19/2012 1126   NITRITE NEGATIVE 08/19/2012 1126   LEUKOCYTESUR SMALL (A) 08/19/2012 1126      Imaging Results:    No results found.  My personal review of EKG: Rhythm NSR, no ST changes   Assessment & Plan:    Active Problems:   GI bleed   1. GI bleed-patient has guaiac positive  stool, hemoglobin is stable.  She is currently on Eliquis and dual antiplatelet therapy with  aspirin and Plavix.  Will hold Eliquis, aspirin and Plavix at this time.  We will keep patient n.p.o. in anticipation for EGD in a.m.  Start Protonix 40 mg IV every 12 hours.  GI has been consulted by ED physician.  Dr. Kendell Bane will see patient in a.m. 2. Altered mental status-resolved, secondary to hypoglycemia.  Patient received 2 A of D50 in the ED and blood glucose has improved. 3. Diabetes mellitus type 2-we will start sliding scale insulin with NovoLog, check CBG every 4 hours.  4. ESRD-on hemodialysis Tuesday Thursday Friday.  Will consult nephrology in a.m.  Continue midodrine for hypotension.  Continue calcitriol. 5. Atrial fibrillation-continue amiodarone, will hold Eliquis due to GI bleed as above.  Heart rate is controlled. 6. Right lower extremity osteomyelitis-patient is on doxycycline for total 3 weeks.  Stop date 09/18/2019.  We will continue with doxycycline.    DVT Prophylaxis-   SCDs   AM Labs Ordered, also please review Full Orders  Family Communication: Admission, patients condition and plan of care including tests being ordered have been discussed with the patient  who indicate understanding and agree with the plan and Code Status.  Code Status: Full code  Admission status: Observation  Time spent in minutes : 60 minutes   Breanda Greenlaw S Randall Colden M.D

## 2019-09-13 NOTE — ED Provider Notes (Signed)
[facility] Provider Note   CSN: [id number] Arrival date & time: 09/12/19  1717     History Chief Complaint  Patient presents with  . Altered Mental Status    Alexis [name] is a 74 y.o. female.  Patient has a history of dialysis.  She presents to the emergency department obtunded.  Supposedly she was very sleepy in dialysis and when she was picked up to be transferred back home she was unresponsive so they drove her straight to the emergency department.  Patient has a history of diabetes  The history is provided by the EMS personnel and medical records.  Altered Mental Status Presenting symptoms: unresponsiveness   Severity:  Severe Most recent episode:  Today Episode history:  Single Timing:  Constant Progression:  Unchanged Chronicity:  New Context: not alcohol use   Associated symptoms: no abdominal pain        Past Medical History:  Diagnosis Date  . Arthritis   . Cancer [facility]) 2005    left breast  . CHF (congestive heart failure) ([place])   . Chronic back pain   . Chronic kidney disease 03/2014   dialysis t/th/sa  . Coronary artery disease   . Diabetes mellitus    Type 2  . Diabetic nephropathy ([place]) 01-08-13  . Dyslipidemia 01-08-13  . ESRD on hemodialysis ([place])    Tu, Th, Sat  . Headache   . Hyperlipidemia   . Hypertension   . LBBB (left bundle branch block)   . Proteinuria 01-08-13  . Pulmonary hypertension ([place])    PA peak pressure 73 mmHg 08/05/17 echo  . Thyroid disease 01-08-13   Hyper-parathyroidism-secondary  . Wears glasses     Patient Active Problem List   Diagnosis Date Noted  . GI bleed 09/13/2019  . Gangrene of left foot ([place])   . 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 PVD (peripheral vascular disease) ([place])   . Osteomyelitis ([place]) 08/18/2019  . Diabetic foot ulcers ([place]) 08/18/2019  . Emesis 08/18/2019  . Altered mental status 06/11/2019  . Generalized weakness 06/11/2019  . Acute delirium  06/11/2019  . Hallucinations 06/11/2019  . Acute CVA (cerebrovascular accident) ([place]) 06/11/2019  . Hypokalemia 05/12/2019  . Hyperlipidemia   . Pulmonary hypertension ([place])   . GERD (gastroesophageal reflux disease)   . Stable angina (HCC)   . Atrial fibrillation with rapid ventricular response ([place]) 05/09/2019  . Elevated troponin   . ESRD on hemodialysis ([place])   . Dyspnea 08/13/2017  . Chest pain 08/04/2017  . 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 Essential hypertension 08/15/2012  . CAD (coronary artery disease) 03/25/2011  . Type II diabetes mellitus with manifestations ([place]) 03/25/2011  . Chest pain 03/25/2011  . CHF (congestive heart failure) ([place]) 10/14/2010  . Anemia in chronic kidney disease (CKD) 10/14/2010  . Obesity 10/14/2010    Past Surgical History:  Procedure Laterality Date  . A/V FISTULAGRAM N/A 08/25/2017   Procedure: A/V FISTULAGRAM - Left Arm;  Surgeon: Angelia Mould, MD;  Location: [facility];  Service: Cardiovascular;  Laterality: N/A;  . A/V FISTULAGRAM N/A 02/09/2018   Procedure: A/V [id number];  Surgeon: Elam Dutch, MD;  Location: [facility];  Service: Cardiovascular;  Laterality: N/A;  . A/V FISTULAGRAM Left 04/04/2018   Procedure: A/V FISTULAGRAM;  Surgeon: Marty Heck, MD;  Location: [place] [facility];  Service: Cardiovascular;  Laterality: Left;  .  A/V FISTULAGRAM Right 07/22/2019   Procedure: A/V FISTULAGRAM - Right Arm;  Surgeon: Waynetta Sandy, MD;  Location: [facility];  Service: Cardiovascular;  Laterality: Right;  . ABDOMINAL AORTAGRAM N/A 08/11/2014   Procedure: ABDOMINAL Maxcine Ham;  Surgeon: Angelia Mould, MD;  Location: [facility] C[facility] LAB;  Service: Cardiovascular;  Laterality: N/A;  . ABDOMINAL AORTOGRAM W/LOWER EXTREMITY N/A 08/22/2019   Procedure: ABDOMINAL AORTOGRAM W/LOWER EXTREMITY;  Surgeon: Serafina Mitchell, MD;  Location: [facility];  Service: Cardiovascular;  Laterality: N/A;  . ABDOMINAL HYSTERECTOMY    . AMPUTATION Left 08/25/2019   Procedure: LEFT AMPUTATION ABOVE KNEE;  Surgeon: Newt Minion, MD;  Location: [facility];  Service: Orthopedics;  Laterality: Left;  . APPENDECTOMY    . Arm surgery     Left arm trauma  . AV FISTULA PLACEMENT Left 08/21/2014   Procedure: LEFT ARM ARTERIOVENOUS (AV) FISTULA CREATION ;  Surgeon: Angelia Mould, MD;  Location: [facility] OR;  Service: Vascular;  Laterality: Left;  . AV FISTULA PLACEMENT Right 06/13/2018   Procedure: insertion of right arm ARTERIOVENOUS (AV) gore-tex GRAFT;  Surgeon: Rosetta Posner, MD;  Location: [facility];  Service: Vascular;  Laterality: Right;  . BACK SURGERY    . CATARACT EXTRACTION W/PHACO Left 12/22/2014   Procedure: CATARACT EXTRACTION PHACO AND INTRAOCULAR LENS PLACEMENT (IOC);  Surgeon: Tonny Branch, MD;  Location: [facility];  Service: Ophthalmology;  Laterality: Left;  CDE 13.48  . CORONARY ANGIOPLASTY  12/19/2003   inferior wall hypokinesis. ef 50%  . dialysis catheter    . INSERTION OF DIALYSIS CATHETER Right 06/13/2018   Procedure: INSERTION OF DIALYSIS CATHETER, right internal jugular;  Surgeon: Rosetta Posner, MD;  Location: [facility];  Service: Vascular;  Laterality: Right;  . IR FLUORO GUIDE CV LINE RIGHT  04/06/2018  . IR REMOVAL TUN CV CATH W/O FL  04/25/2018  . IR REMOVAL TUN CV CATH W/O FL  07/20/2018  . IR US GUIDE VASC ACCESS RIGHT  04/06/2018  . LIGATION OF ARTERIOVENOUS  FISTULA Left 03/18/2019   Procedure: LIGATION OF ARTERIOVENOUS  FISTULA  LEFT ARM;  Surgeon: Angelia Mould, MD;  Location: [facility];  Service: Vascular;  Laterality: Left;  Marland Kitchen MASTECTOMY     Left  . PERIPHERAL VASCULAR BALLOON ANGIOPLASTY Left 08/25/2017   Procedure: PERIPHERAL VASCULAR BALLOON ANGIOPLASTY;  Surgeon: Angelia Mould, MD;  Location: [place] [facility];  Service: Cardiovascular;  Laterality: Left;  arm fistula  . PERIPHERAL VASCULAR BALLOON ANGIOPLASTY Left  02/09/2018   Procedure: PERIPHERAL VASCULAR BALLOON ANGIOPLASTY;  Surgeon: Elam Dutch, MD;  Location: [facility];  Service: Cardiovascular;  Laterality: Left;  AV Fistula  . PERIPHERAL VASCULAR BALLOON ANGIOPLASTY Left 04/04/2018   Procedure: PERIPHERAL VASCULAR BALLOON ANGIOPLASTY;  Surgeon: Marty Heck, MD;  Location: [facility];  Service: Cardiovascular;  Laterality: Left;  UPPER ARM FISTULA  . PERIPHERAL VASCULAR CATHETERIZATION N/A 09/16/2015   Procedure: Fistulagram;  Surgeon: Rosetta Posner, MD;  Location: [facility];  Service: Cardiovascular;  Laterality: N/A;  . PERIPHERAL VASCULAR INTERVENTION  08/22/2019   Procedure: PERIPHERAL VASCULAR INTERVENTION;  Surgeon: Serafina Mitchell, MD;  Location: [facility];  Service: Cardiovascular;;  Rt. SFA and Popliteal w/Shockwavw treatment  . SPINE SURGERY    . TONSILLECTOMY    . TRANSTHORACIC ECHOCARDIOGRAM  10/01/2010    Left ventricle: The cavity size was mildly dilated. Wall thickness was increased in a pattern of  mild LVH. Systolic function was   mildly reduced. The estimated ejection fraction was in the range  of 45% to 50%.   . TUBAL LIGATION       OB History   No obstetric history on file.     Family History  Problem Relation Age of Onset  . Breast cancer Mother        Died age 1  . Cancer Mother 70       Breast  . Heart disease Mother   . Hyperlipidemia Mother   . Hypertension Mother   . Heart attack Mother     Social History   Tobacco Use  . Smoking status: Former Smoker    Packs/day: 1.00    Years: 50.00    Pack years: 50.00    Types: Cigarettes    Quit date: 05/16/1998    Years since quitting: 21.3  . Smokeless tobacco: Never Used  Substance Use Topics  . Alcohol use: No    Alcohol/week: 0.0 standard drinks  . Drug use: No    Home Medications Prior to Admission medications   Medication Sig Start Date End Date Taking? Authorizing Provider  albuterol (VENTOLIN HFA) 108  (90 Base) MCG/ACT inhaler Inhale 1-2 puffs into the lungs every 6 (six) hours as needed for wheezing or shortness of breath.   Yes [provider]  amiodarone (PACERONE) 200 MG tablet Take 1 tablet (200 mg total) by mouth daily. Patient taking differently: Take 200 mg by mouth daily. (0900) 05/30/19  Yes Strader, [place] M, PA-C  apixaban (ELIQUIS) 2.5 MG TABS tablet Take 1 tablet (2.5 mg total) by mouth 2 (two) times daily. Patient taking differently: Take 2.5 mg by mouth 2 (two) times daily. 0900 & 2100 06/13/19 07/17/20 Yes Shah, Pratik D, DO  aspirin EC 81 MG EC tablet Take 1 tablet (81 mg total) by mouth daily. 08/31/19  Yes Thurnell Lose, MD  atorvastatin (LIPITOR) 80 MG tablet Take 1 tablet (80 mg total) by mouth daily. 05/30/19  Yes Strader, [place] M, PA-C  calcitRIOL (ROCALTROL) 0.25 MCG capsule Take 0.25 mcg by mouth Every Tuesday,Thursday,and Saturday with dialysis. (1700) 03/26/14  Yes [provider]  clopidogrel (PLAVIX) 75 MG tablet Take 1 tablet (75 mg total) by mouth daily with breakfast. 08/31/19  Yes Thurnell Lose, MD  insulin aspart (NOVOLOG) 100 UNIT/ML injection Substitute to any brand approved.Before each meal 3 times a day, 140-199 - 2 units, 200-250 - 4 units, 251-299 - 6 units,  300-349 - 8 units,  350 or above 10 units. Dispense syringes and needles as needed, Ok to switch to PEN if approved. DX DM2, Code E11.65 08/31/19  Yes Thurnell Lose, MD  midodrine (PROAMATINE) 5 MG tablet Take 1 tablet (5 mg total) by mouth 3 (three) times daily with meals. 05/30/19  Yes Strader, [place] M, PA-C  multivitamin (RENA-VIT) TABS tablet Take 1 tablet by mouth at bedtime. 08/31/19  Yes Thurnell Lose, MD  NOVOLOG MIX 70/30 FLEXPEN (70-30) 100 UNIT/ML FlexPen Inject 25-30 Units into the skin See admin instructions. 30 units in the morning & 25 units at night 06/05/19  Yes [provider]  pantoprazole (PROTONIX) 40 MG tablet Take 1 tablet (40 mg total) by  mouth daily. 08/31/19  Yes Thurnell Lose, MD  sevelamer carbonate (RENVELA) 800 MG tablet Take 1,600 mg by mouth 3 (three) times daily with meals.   Yes [provider]  diclofenac Sodium (VOLTAREN) 1 % GEL Apply 2 g  topically 4 (four) times daily as needed for pain. 06/26/19   [provider]  docusate sodium (COLACE) 100 MG capsule Take 100 mg by mouth every other day. Hold for loose stools    [provider]  doxycycline (VIBRA-TABS) 100 MG tablet Take 1 tablet (100 mg total) by mouth every 12 (twelve) hours for 19 days. 08/31/19 09/19/19  Thurnell Lose, MD  lidocaine-prilocaine (EMLA) cream Apply 1 application topically Every Tuesday,Thursday,and Saturday with dialysis. 1 hour prior to dialysis    [provider]  nitroGLYCERIN (NITROSTAT) 0.4 MG SL tablet Place 0.4 mg under the tongue every 5 (five) minutes x 3 doses as needed for chest pain.     [provider]  ondansetron (ZOFRAN-ODT) 4 MG disintegrating tablet Take 4 mg by mouth every 6 (six) hours as needed for nausea or vomiting.  05/18/19   [provider]  oxyCODONE (OXY IR/ROXICODONE) 5 MG immediate release tablet Take 1 tablet (5 mg total) by mouth every 6 (six) hours as needed for moderate pain or severe pain (pain score 4-6). 08/31/19   Thurnell Lose, MD  polyethylene glycol (MIRALAX / GLYCOLAX) packet Take 17 g by mouth daily as needed for mild constipation.     [provider]    Allergies    Olmesartan  Review of Systems   Review of Systems  Unable to perform ROS: Mental status change  Gastrointestinal: Negative for abdominal pain.    Physical Exam Updated Vital Signs BP (!) 96/52   Pulse (!) 108   Temp 98.2 F (36.8 C)   Resp 20   Ht 5\' 6"  (1.676 m)   Wt 85 kg   SpO2 99%   BMI 30.25 kg/m   Physical Exam Vitals and nursing note reviewed.  Constitutional:      Appearance: She is well-developed.     Comments: Patient only responding to painful  stimuli  HENT:     Head: Normocephalic.     Nose: Nose normal.     Mouth/Throat:     Mouth: Mucous membranes are moist.  Eyes:     General: No scleral icterus.    Conjunctiva/sclera: Conjunctivae normal.  Neck:     Thyroid: No thyromegaly.  Cardiovascular:     Rate and Rhythm: Normal rate and regular rhythm.     Heart sounds: No murmur. No friction rub. No gallop.   Pulmonary:     Breath sounds: No stridor. No wheezing or rales.  Chest:     Chest wall: No tenderness.  Abdominal:     General: There is no distension.     Tenderness: There is no abdominal tenderness. There is no rebound.  Genitourinary:    Comments: Patient was having black stools that were heme positive Musculoskeletal:        General: Normal range of motion.     Cervical back: Neck supple.     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  Skin:    Findings: No erythema or rash.  Neurological:     Motor: No abnormal muscle tone.     Coordination: Coordination normal.     Comments: Patient only responding to painful stimuli     ED Results / Procedures / Treatments   Labs (all labs ordered are listed, but only abnormal results are displayed) Labs Reviewed  CBC WITH DIFFERENTIAL/PLATELET - Abnormal; Notable for the following  components:      Result Value   RBC 3.81 (*)    Hemoglobin 9.9 (*)    HCT 31.0 (*)    RDW 19.2 (*)    Lymphs Abs 0.6 (*)    All other components within normal limits  COMPREHENSIVE METABOLIC PANEL - Abnormal; Notable for the following components:   Sodium 130 (*)    Chloride 92 (*)    Creatinine, Ser 4.49 (*)    Calcium 8.3 (*)    Total Protein 8.2 (*)    Albumin 2.9 (*)    GFR calc non Af Amer 9 (*)    GFR calc Af Amer 11 (*)    All other components within normal limits  HEMOGLOBIN A1C - Abnormal; Notable for the following components:   Hgb A1c MFr Bld 5.7 (*)     All other components within normal limits  CBC - Abnormal; Notable for the following components:   RBC 3.44 (*)    Hemoglobin 9.0 (*)    HCT 28.3 (*)    RDW 19.7 (*)    All other components within normal limits  COMPREHENSIVE METABOLIC PANEL - Abnormal; Notable for the following components:   Chloride 95 (*)    Creatinine, Ser 5.15 (*)    Albumin 3.0 (*)    GFR calc non Af Amer 8 (*)    GFR calc Af Amer 9 (*)    Anion gap 17 (*)    All other components within normal limits  GLUCOSE, CAPILLARY - Abnormal; Notable for the following components:   Glucose-Capillary 56 (*)    All other components within normal limits  CBG MONITORING, ED - Abnormal; Notable for the following components:   Glucose-Capillary <10 (*)    All other components within normal limits  POC OCCULT BLOOD, ED - Abnormal; Notable for the following components:   Fecal Occult Bld POSITIVE (*)    All other components within normal limits  CBG MONITORING, ED - Abnormal; Notable for the following components:   Glucose-Capillary 212 (*)    All other components within normal limits  RESPIRATORY PANEL BY RT PCR (FLU A&B, COVID)  GLUCOSE, CAPILLARY  GLUCOSE, CAPILLARY  CBC WITH DIFFERENTIAL/PLATELET  CBG MONITORING, ED  TYPE AND SCREEN    EKG None  Radiology No results found.  Procedures Aspiration of blood/fluid  Date/Time: 09/13/2019 8:59 AM Performed by: Milton Ferguson, MD Authorized by: Milton Ferguson, MD  Consent: Verbal consent obtained. Written consent not obtained. Consent given by: patient Patient understanding: patient states understanding of the procedure being performed Patient consent: the patient's understanding of the procedure matches consent given Procedure consent: procedure consent matches procedure scheduled Relevant documents: relevant documents not present or verified Test results: test results not available Site marked: the operative site was marked Imaging studies: imaging studies  not available Patient identity confirmed: verbally with patient Time out: Immediately prior to procedure a "time out" was called to verify the correct patient, procedure, equipment, support staff and site/side marked as required. Local anesthesia used: no  Anesthesia: Local anesthesia used: no  Sedation: Patient sedated: no  Comments: Patient had a femoral vein stick for blood that was successful    (including critical care time)  Medications Ordered in ED Medications  midodrine (PROAMATINE) tablet 5 mg (5 mg Oral Given 09/13/19 0050)  amiodarone (PACERONE) tablet 200 mg (has no administration in time range)  atorvastatin (LIPITOR) tablet 80 mg (has no administration in time range)  sevelamer carbonate (RENVELA) tablet 1,600 mg (has  no administration in time range)  albuterol (PROVENTIL) (2.5 MG/3ML) 0.083% nebulizer solution 3 mL (has no administration in time range)  sodium chloride flush (NS) 0.9 % injection 3 mL (has no administration in time range)  sodium chloride flush (NS) 0.9 % injection 3 mL (has no administration in time range)  0.9 %  sodium chloride infusion (has no administration in time range)  pantoprazole (PROTONIX) injection 40 mg (has no administration in time range)  acetaminophen (TYLENOL) tablet 650 mg (has no administration in time range)    Or  acetaminophen (TYLENOL) suppository 650 mg (has no administration in time range)  ondansetron (ZOFRAN) tablet 4 mg (has no administration in time range)    Or  ondansetron (ZOFRAN) injection 4 mg (has no administration in time range)  doxycycline (VIBRA-TABS) tablet 100 mg (has no administration in time range)  insulin aspart (novoLOG) injection 0-9 Units (has no administration in time range)  dextrose (GLUTOSE) 40 % oral gel (has no administration in time range)  dextrose 50 % solution 50 mL (50 mLs Intravenous Given 09/12/19 1733)  dextrose 50 % solution 50 mL (50 mLs Intravenous Given 09/12/19 1734)  pantoprazole  (PROTONIX) injection 40 mg (40 mg Intravenous Given 09/12/19 1801)  sodium chloride 0.9 % bolus 500 mL (0 mLs Intravenous Stopped 09/12/19 1948)  dextrose 50 % solution 12.5 g (12.5 g Intravenous Given 09/13/19 3716)    ED Course  I have reviewed the triage vital signs and the nursing notes.  Pertinent labs & imaging results that were available during my care of the patient were reviewed by me and considered in my medical decision making (see chart for details). CRITICAL CARE Performed by: Milton Ferguson Total critical care time: 55 minutes Critical care time was exclusive of separately billable procedures and treating other patients. Critical care was necessary to treat or prevent imminent or life-threatening deterioration. Critical care was time spent personally by me on the following activities: development of treatment plan with patient and/or surrogate as well as nursing, discussions with consultants, evaluation of patient's response to treatment, examination of patient, obtaining history from patient or surrogate, ordering and performing treatments and interventions, ordering and review of laboratory studies, ordering and review of radiographic studies, pulse oximetry and re-evaluation of patient's condition. Patient had a fingerstick glucose that was 10.  Patient was given 2 A of D50 and became alert and responsive.  She was back to normal.   MDM Rules/Calculators/A&P    Patient with hypoglycemic episode that responded to D50.  She also has a GI bleed with stable hemoglobin.  I spoke with Dr. Sydell Axon the GI specialist and he agreed with admission to the hospital to the hospital service and GI consult tomorrow    This patient presents to the ED for concern of altered mental status., this involves an extensive number of treatment options, and is a complaint that carries with it a high risk of complications and morbidity.  The differential diagnosis includes stroke hypoglycemia   Lab  Tests:   I Ordered, reviewed, and interpreted labs, which included CBC chemistries troponin patient has hypoglycemia and anemia  Medicines ordered:   I ordered medication D50  Imaging Studies ordered:   Additional history obtained:   Additional history obtained from records  Previous records obtained and reviewed   Consultations Obtained:   I consulted gastroenterology and hospitalist and discussed lab and imaging findings  Reevaluation:  After the interventions stated above, I reevaluated the patient and found patient improved with glucose  Critical Interventions:  .                    Final Clinical Impression(s) / ED Diagnoses Final diagnoses:  Hypoglycemia  UGI bleed    Rx / DC Orders ED Discharge Orders    None       Milton Ferguson, MD 09/13/19 [phone]

## 2019-09-13 NOTE — Consult Note (Signed)
Referring Provider: [facility] Primary Care Physician:  System, Provider Not In Primary Gastroenterologist:  Dr. Oneida Alar (previously unassigned)  Date of Admission: 09/12/19 Date of Consultation: 09/13/19  Reason for Consultation:  GI Bleed, Anemia  HPI:  Alexis [name] is a 74 y.o. female with a past medical history of left breast cancer, CHF, A. fib on chronic anticoagulation, chronic kidney disease on dialysis (Tuesday, Thursday, Saturday), CAD, type 2 diabetes, hyperlipidemia, hypertension, pulmonary hypertension. Recent discharge 08/31/2019 after left AKA for calcaneal osteomyelitis. Also recent leg drug-eluting stent by vascular surgery started on DAPT. She presented to the emergency department decreased responsiveness and found to have severe hypoglycemia with a blood sugar of less than 10 but responded to 2 A of D50. Noted to be stool positive, hemoglobin at baseline 9.9. The patient admitted a bloody stool prior to hemodialysis. In the ED when the patient was being cleaned up black stool was noted. GI was consulted in the middle the night and recommended keep n.p.o. for possible EGD in the a.m. Her Eliquis, aspirin, Plavix were held as of last night. She started on IV Protonix twice daily.  Review of labs today shows decline in hemoglobin from 9.9 to 9.0, platelets normal at 215, no leukocytosis. CMP was essentially normal electrolytes, creatinine 5.15 as expected on ESRD, LFTs normal.  Today she states she is feeling okay overall.  She does note that she did have black stools prior to dialysis which concerned her.  She has not had this before.  She has never had an upper endoscopy before.  She does take ibuprofen and/or Motrin, but only about once a week "if that".  Denies aspirin powders.  No GERD symptoms or abdominal pain.  She did have a colonoscopy 1 year ago at [facility], we will request reports.  She states it was a normal exam.  She is concerned about obtaining prosthesis after  her recent AKA.  Denies nausea or vomiting.  She does take Eliquis for A. fib.  No other overt GI complaints.  Past Medical History:  Diagnosis Date  . Arthritis   . Cancer [facility]) 2005    left breast  . CHF (congestive heart failure) ([place])   . Chronic back pain   . Chronic kidney disease 03/2014   dialysis t/th/sa  . Coronary artery disease   . Diabetes mellitus    Type 2  . Diabetic nephropathy ([place]) 01-08-13  . Dyslipidemia 01-08-13  . ESRD on hemodialysis ([place])    Tu, Th, Sat  . Headache   . Hyperlipidemia   . Hypertension   . LBBB (left bundle branch block)   . Proteinuria 01-08-13  . Pulmonary hypertension ([place])    PA peak pressure 73 mmHg 08/05/17 echo  . Thyroid disease 01-08-13   Hyper-parathyroidism-secondary  . Wears glasses     Past Surgical History:  Procedure Laterality Date  . A/V FISTULAGRAM N/A 08/25/2017   Procedure: A/V FISTULAGRAM - Left Arm;  Surgeon: Angelia Mould, MD;  Location: [place] [facility];  Service: Cardiovascular;  Laterality: N/A;  . A/V FISTULAGRAM N/A 02/09/2018   Procedure: A/V [id number];  Surgeon: Elam Dutch, MD;  Location: [place] [facility];  Service: Cardiovascular;  Laterality: N/A;  . A/V FISTULAGRAM Left 04/04/2018   Procedure: A/V FISTULAGRAM;  Surgeon: Marty Heck, MD;  Location: [place] [facility];  Service: Cardiovascular;  Laterality: Left;  . A/V FISTULAGRAM Right 07/22/2019   Procedure: A/V FISTULAGRAM - Right Arm;  Surgeon: Waynetta Sandy,  MD;  Location: [place] [facility];  Service: Cardiovascular;  Laterality: Right;  . ABDOMINAL AORTAGRAM N/A 08/11/2014   Procedure: ABDOMINAL Maxcine Ham;  Surgeon: Angelia Mould, MD;  Location: [facility] CATH LAB;  Service: Cardiovascular;  Laterality: N/A;  . ABDOMINAL AORTOGRAM W/LOWER EXTREMITY N/A 08/22/2019   Procedure: ABDOMINAL AORTOGRAM W/LOWER EXTREMITY;  Surgeon: Serafina Mitchell, MD;  Location: [place] [facility];  Service: Cardiovascular;  Laterality:  N/A;  . ABDOMINAL HYSTERECTOMY    . AMPUTATION Left 08/25/2019   Procedure: LEFT AMPUTATION ABOVE KNEE;  Surgeon: Newt Minion, MD;  Location: [place];  Service: Orthopedics;  Laterality: Left;  . APPENDECTOMY    . Arm surgery     Left arm trauma  . AV FISTULA PLACEMENT Left 08/21/2014   Procedure: LEFT ARM ARTERIOVENOUS (AV) FISTULA CREATION ;  Surgeon: Angelia Mould, MD;  Location: [facility] OR;  Service: Vascular;  Laterality: Left;  . AV FISTULA PLACEMENT Right 06/13/2018   Procedure: insertion of right arm ARTERIOVENOUS (AV) gore-tex GRAFT;  Surgeon: Rosetta Posner, MD;  Location: [place];  Service: Vascular;  Laterality: Right;  . BACK SURGERY    . CATARACT EXTRACTION W/PHACO Left 12/22/2014   Procedure: CATARACT EXTRACTION PHACO AND INTRAOCULAR LENS PLACEMENT (IOC);  Surgeon: Tonny Branch, MD;  Location: [facility];  Service: Ophthalmology;  Laterality: Left;  CDE 13.48  . CORONARY ANGIOPLASTY  12/19/2003   inferior wall hypokinesis. ef 50%  . dialysis catheter    . INSERTION OF DIALYSIS CATHETER Right 06/13/2018   Procedure: INSERTION OF DIALYSIS CATHETER, right internal jugular;  Surgeon: Rosetta Posner, MD;  Location: [place];  Service: Vascular;  Laterality: Right;  . IR FLUORO GUIDE CV LINE RIGHT  04/06/2018  . IR REMOVAL TUN CV CATH W/O FL  04/25/2018  . IR REMOVAL TUN CV CATH W/O FL  07/20/2018  . IR US GUIDE VASC ACCESS RIGHT  04/06/2018  . LIGATION OF ARTERIOVENOUS  FISTULA Left 03/18/2019   Procedure: LIGATION OF ARTERIOVENOUS  FISTULA  LEFT ARM;  Surgeon: Angelia Mould, MD;  Location: [place];  Service: Vascular;  Laterality: Left;  Marland Kitchen MASTECTOMY     Left  . PERIPHERAL VASCULAR BALLOON ANGIOPLASTY Left 08/25/2017   Procedure: PERIPHERAL VASCULAR BALLOON ANGIOPLASTY;  Surgeon: Angelia Mould, MD;  Location: [place] [facility];  Service: Cardiovascular;  Laterality: Left;  arm fistula  . PERIPHERAL VASCULAR BALLOON ANGIOPLASTY Left 02/09/2018   Procedure: PERIPHERAL VASCULAR  BALLOON ANGIOPLASTY;  Surgeon: Elam Dutch, MD;  Location: [place] [facility];  Service: Cardiovascular;  Laterality: Left;  AV Fistula  . PERIPHERAL VASCULAR BALLOON ANGIOPLASTY Left 04/04/2018   Procedure: PERIPHERAL VASCULAR BALLOON ANGIOPLASTY;  Surgeon: Marty Heck, MD;  Location: [place] [facility];  Service: Cardiovascular;  Laterality: Left;  UPPER ARM FISTULA  . PERIPHERAL VASCULAR CATHETERIZATION N/A 09/16/2015   Procedure: Fistulagram;  Surgeon: Rosetta Posner, MD;  Location: [place] [facility];  Service: Cardiovascular;  Laterality: N/A;  . PERIPHERAL VASCULAR INTERVENTION  08/22/2019   Procedure: PERIPHERAL VASCULAR INTERVENTION;  Surgeon: Serafina Mitchell, MD;  Location: [place] [facility];  Service: Cardiovascular;;  Rt. SFA and Popliteal w/Shockwavw treatment  . SPINE SURGERY    . TONSILLECTOMY    . TRANSTHORACIC ECHOCARDIOGRAM  10/01/2010    Left ventricle: The cavity size was mildly dilated. Wall thickness was increased in a pattern of mild LVH. Systolic function was   mildly reduced. The estimated ejection fraction was in the range  of 45% to 50%.   . TUBAL LIGATION      Prior to Admission medications   Medication Sig Start Date End Date Taking? Authorizing Provider  albuterol (VENTOLIN HFA) 108 (90 Base) MCG/ACT inhaler Inhale 1-2 puffs into the lungs every 6 (six) hours as needed for wheezing or shortness of breath.   Yes [provider]  amiodarone (PACERONE) 200 MG tablet Take 1 tablet (200 mg total) by mouth daily. Patient taking differently: Take 200 mg by mouth daily. (0900) 05/30/19  Yes Strader, [place] M, PA-C  apixaban (ELIQUIS) 2.5 MG TABS tablet Take 1 tablet (2.5 mg total) by mouth 2 (two) times daily. Patient taking differently: Take 2.5 mg by mouth 2 (two) times daily. 0900 & 2100 06/13/19 07/17/20 Yes Shah, Pratik D, DO  aspirin EC 81 MG EC tablet Take 1 tablet (81 mg total) by mouth daily. 08/31/19  Yes Thurnell Lose, MD  atorvastatin  (LIPITOR) 80 MG tablet Take 1 tablet (80 mg total) by mouth daily. 05/30/19  Yes Strader, [place] M, PA-C  calcitRIOL (ROCALTROL) 0.25 MCG capsule Take 0.25 mcg by mouth Every Tuesday,Thursday,and Saturday with dialysis. (1700) 03/26/14  Yes [provider]  clopidogrel (PLAVIX) 75 MG tablet Take 1 tablet (75 mg total) by mouth daily with breakfast. 08/31/19  Yes Thurnell Lose, MD  insulin aspart (NOVOLOG) 100 UNIT/ML injection Substitute to any brand approved.Before each meal 3 times a day, 140-199 - 2 units, 200-250 - 4 units, 251-299 - 6 units,  300-349 - 8 units,  350 or above 10 units. Dispense syringes and needles as needed, Ok to switch to PEN if approved. DX DM2, Code E11.65 08/31/19  Yes Thurnell Lose, MD  midodrine (PROAMATINE) 5 MG tablet Take 1 tablet (5 mg total) by mouth 3 (three) times daily with meals. 05/30/19  Yes Strader, [place] M, PA-C  multivitamin (RENA-VIT) TABS tablet Take 1 tablet by mouth at bedtime. 08/31/19  Yes Thurnell Lose, MD  NOVOLOG MIX 70/30 FLEXPEN (70-30) 100 UNIT/ML FlexPen Inject 25-30 Units into the skin See admin instructions. 30 units in the morning & 25 units at night 06/05/19  Yes [provider]  pantoprazole (PROTONIX) 40 MG tablet Take 1 tablet (40 mg total) by mouth daily. 08/31/19  Yes Thurnell Lose, MD  sevelamer carbonate (RENVELA) 800 MG tablet Take 1,600 mg by mouth 3 (three) times daily with meals.   Yes [provider]  diclofenac Sodium (VOLTAREN) 1 % GEL Apply 2 g topically 4 (four) times daily as needed for pain. 06/26/19   [provider]  docusate sodium (COLACE) 100 MG capsule Take 100 mg by mouth every other day. Hold for loose stools    [provider]  doxycycline (VIBRA-TABS) 100 MG tablet Take 1 tablet (100 mg total) by mouth every 12 (twelve) hours for 19 days. 08/31/19 09/19/19  Thurnell Lose, MD  lidocaine-prilocaine (EMLA) cream Apply 1 application topically Every  Tuesday,Thursday,and Saturday with dialysis. 1 hour prior to dialysis    [provider]  nitroGLYCERIN (NITROSTAT) 0.4 MG SL tablet Place 0.4 mg under the tongue every 5 (five) minutes x 3 doses as needed for chest pain.     [provider]  ondansetron (ZOFRAN-ODT) 4 MG disintegrating tablet Take 4 mg by mouth every 6 (six) hours as needed for nausea or vomiting.  05/18/19   [provider]  oxyCODONE (OXY IR/ROXICODONE) 5 MG immediate release tablet Take 1 tablet (5 mg total) by mouth  every 6 (six) hours as needed for moderate pain or severe pain (pain score 4-6). 08/31/19   Thurnell Lose, MD  polyethylene glycol (MIRALAX / GLYCOLAX) packet Take 17 g by mouth daily as needed for mild constipation.     [provider]    Current Facility-Administered Medications  Medication Dose Route Frequency Provider Last Rate Last Admin  . 0.9 %  sodium chloride infusion  250 mL Intravenous PRN Oswald Hillock, MD      . acetaminophen (TYLENOL) tablet 650 mg  650 mg Oral Q6H PRN Oswald Hillock, MD       Or  . acetaminophen (TYLENOL) suppository 650 mg  650 mg Rectal Q6H PRN Darrick Meigs, Marge Duncans, MD      . albuterol (PROVENTIL) (2.5 MG/3ML) 0.083% nebulizer solution 3 mL  3 mL Inhalation Q6H PRN Oswald Hillock, MD      . amiodarone (PACERONE) tablet 200 mg  200 mg Oral Daily Darrick Meigs, Gagan S, MD      . atorvastatin (LIPITOR) tablet 80 mg  80 mg Oral Daily Lama, Gagan S, MD      . dextrose (GLUTOSE) 40 % oral gel           . doxycycline (VIBRA-TABS) tablet 100 mg  100 mg Oral Q12H Lama, Marge Duncans, MD      . insulin aspart (novoLOG) injection 0-9 Units  0-9 Units Subcutaneous Q4H Lama, Marge Duncans, MD      . midodrine (PROAMATINE) tablet 5 mg  5 mg Oral TID WC Oswald Hillock, MD   5 mg at 09/13/19 0050  . ondansetron (ZOFRAN) tablet 4 mg  4 mg Oral Q6H PRN Oswald Hillock, MD       Or  . ondansetron (ZOFRAN) injection 4 mg  4 mg Intravenous Q6H PRN Oswald Hillock, MD      . pantoprazole  (PROTONIX) injection 40 mg  40 mg Intravenous Q12H Darrick Meigs, Marge Duncans, MD      . sevelamer carbonate (RENVELA) tablet 1,600 mg  1,600 mg Oral TID WC Darrick Meigs, Marge Duncans, MD      . sodium chloride flush (NS) 0.9 % injection 3 mL  3 mL Intravenous Q12H Darrick Meigs, Marge Duncans, MD      . sodium chloride flush (NS) 0.9 % injection 3 mL  3 mL Intravenous PRN Oswald Hillock, MD        Allergies as of 09/12/2019 - Review Complete 09/12/2019  Allergen Reaction Noted  . Olmesartan Other (See Comments) 10/23/2012    Family History  Problem Relation Age of Onset  . Breast cancer Mother        Died age 8  . Cancer Mother 51       Breast  . Heart disease Mother   . Hyperlipidemia Mother   . Hypertension Mother   . Heart attack Mother     Social History   Socioeconomic History  . Marital status: Legally Separated    Spouse name: Not on file  . Number of children: 3  . Years of education: Not on file  . Highest education level: Not on file  Occupational History  . Occupation: Retired  Tobacco Use  . Smoking status: Former Smoker    Packs/day: 1.00    Years: 50.00    Pack years: 50.00    Types: Cigarettes    Quit date: 05/16/1998    Years since quitting: 21.3  . Smokeless tobacco: Never Used  Substance and Sexual Activity  .  Alcohol use: No    Alcohol/week: 0.0 standard drinks  . Drug use: No  . Sexual activity: Yes    Birth control/protection: Post-menopausal  Other Topics Concern  . Not on file  Social History Narrative   Lives at [place] alone (most of the time).  Three grandchildren and a one year old great grandchild   Social Determinants of Radio broadcast assistant Strain:   . Difficulty of Paying Living Expenses:   Food Insecurity:   . Worried About Charity fundraiser in the Last Year:   . Arboriculturist in the Last Year:   Transportation Needs:   . Film/video editor (Medical):   Marland Kitchen Lack of Transportation (Non-Medical):   Physical Activity:   . Days of Exercise per Week:   .  Minutes of Exercise per Session:   Stress:   . Feeling of Stress :   Social Connections:   . Frequency of Communication with Friends and Family:   . Frequency of Social Gatherings with Friends and Family:   . Attends Religious Services:   . Active Member of Clubs or Organizations:   . Attends Archivist Meetings:   Marland Kitchen Marital Status:   Intimate Partner Violence:   . Fear of Current or Ex-Partner:   . Emotionally Abused:   Marland Kitchen Physically Abused:   . Sexually Abused:     Review of Systems: General: Negative for anorexia, weight loss, fever, chills, fatigue, weakness. ENT: Negative for hoarseness, difficulty swallowing. CV: Negative for chest pain, angina, palpitations, peripheral edema. Denies feeling tachycardic.  Respiratory: Negative for dyspnea at rest, cough, sputum, wheezing.  GI: See history of present illness. GU:  Negative for dysuria, hematuria, urinary incontinence, urinary frequency, nocturnal urination.  MS: Negative for joint pain, low back pain.  Derm: Negative for rash or itching.  Endo: Negative for unusual weight change.  Heme: Negative for bruising or bleeding. Allergy: Negative for rash or hives.  Physical Exam: Vital signs in last 24 hours: Temp:  [94.6 F (34.8 C)-98.2 F (36.8 C)] 98.2 F (36.8 C) (04/30 0500) Pulse Rate:  [84-117] 108 (04/30 0500) Resp:  [12-21] 20 (04/30 0500) BP: (85-230)/(52-216) 96/52 (04/30 0500) SpO2:  [92 %-100 %] 99 % (04/30 0500) Weight:  [85 kg] 85 kg (04/29 1721)   General:   Alert,  Well-developed, well-nourished, pleasant and cooperative in NAD Head:  Normocephalic and atraumatic. Eyes:  Sclera clear, no icterus. Conjunctiva pink. Ears:  Normal auditory acuity. Neck:  Supple; no masses or thyromegaly. Lungs:  Clear throughout to auscultation.   No wheezes, crackles, or rhonchi. No acute distress. Heart:  Regular rate and rhythm; no murmurs, clicks, rubs,  or gallops. Sounds tachycardic and confirmed HR 130 on  telemetry, appears to be AFib RVR. Abdomen:  Soft, nontender and nondistended. No masses, hepatosplenomegaly or hernias noted. Normal bowel sounds, without guarding, and without rebound.   Rectal:  Deferred.   Msk:  Symmetrical without gross deformities. Extremities:  Without clubbing or edema. Noted left AKA with staples in place, mild oozing onto dressing. Neurologic:  Alert and  oriented x4;  grossly normal neurologically. Psych:  Alert and cooperative. Normal mood and affect.  Intake/Output from previous day: No intake/output data recorded. Intake/Output this shift: No intake/output data recorded.  Lab Results: Recent Labs    09/12/19 2026 09/13/19 0626  WBC 6.5 7.5  HGB 9.9* 9.0*  HCT 31.0* 28.3*  PLT 238 215   BMET Recent Labs    09/12/19  2026 09/13/19 0626  NA 130* 135  K 3.8 3.7  CL 92* 95*  CO2 25 23  GLUCOSE 79 98  BUN 17 20  CREATININE 4.49* 5.15*  CALCIUM 8.3* 8.9   LFT Recent Labs    09/12/19 2026 09/13/19 0626  PROT 8.2* 7.3  ALBUMIN 2.9* 3.0*  AST 24 19  ALT 9 8  ALKPHOS 51 47  BILITOT 0.5 0.9   PT/INR No results for input(s): LABPROT, INR in the last 72 hours. Hepatitis Panel No results for input(s): HEPBSAG, HCVAB, HEPAIGM, HEPBIGM in the last 72 hours. C-Diff No results for input(s): CDIFFTOX in the last 72 hours.  Studies/Results: No results found.  Impression: Pleasant 74 year old female presented for severe hyperglycemia and noted to be heme positive stool with black stools. No previous GI history in our system. No history of colonoscopy or endoscopy in our system. She responded well to D50 but was admitted for likely GI bleed and anemia. She is on Eliquis chronic anticoagulation for atrial fibrillation and is on DAPT with aspirin and Plavix due to recent leg stenting by vascular surgery. She has also recent AKA with hospital discharge 08/31/2019.  GI bleed/anemia- labs today showed decline in hemoglobin from 9.9-9.0. Her Eliquis is  only been held for about 12 hours. Baseline hemoglobin appears to be around 9.9 in the setting of multiple chronic conditions and ESRD. Occasional NSAID use. Tachycardic on exam, bolus given. Differentials include gastritis, esophagitis, duodenitis, peptic ulcer disease, AVMs, erosions.  Less likely gastric or esophageal cancer.  Small bowel AVMs also remain in the differential.  Plan: 1. Continue PPI 2. Continue to hold Eliquis 3. Will likely benefit from EGD when Eliquis held 24-48 hours (will discuss with Dr. Laural Golden who is on this weekend) 4. Monitor hgb closely 5. Monitor for recurrent GI bleed 6. Transfuse as necessary. 7. Supportive measures   Thank you for allowing [place] to participate in the care of [place], DNP, AGNP-C Adult & Gerontological Nurse Practitioner [facility]    LOS: 0 days     09/13/2019, 8:35 AM

## 2019-09-13 NOTE — Progress Notes (Signed)
Discussed case with Dr. Laural Golden (on call over the weekend). He is ok with 24 hour hold on Eliquis, conscious sedation. Plan EGD Saturday morning 8 or 9:00 am.

## 2019-09-13 NOTE — ED Notes (Signed)
Manual pressure attempted by palpitation and auscultation of foot pulse. Unable to obtain pulse to get a reading.

## 2019-09-13 NOTE — Progress Notes (Signed)
Patient arrived to floor with red mews score.  Patient had red mews score in ed before admit to floor.  Emergency staff had difficulty obtaining accurate blood pressure in ed.

## 2019-09-13 NOTE — Care Management Obs Status (Signed)
[place] NOTIFICATION   Patient Details  Name: Alexis [name] MRN: [id number] Date of Birth: January 31, 1946   Medicare Observation Status Notification Given:  Yes    Tommy Medal 09/13/2019, 3:57 PM

## 2019-09-13 NOTE — Consult Note (Signed)
[facility] Renal Consultation Note    Indication for Consultation:  Management of ESRD/hemodialysis; anemia, hypertension/volume and secondary hyperparathyroidism  HPI: Alexis [name] is a 74 y.o. female with a recent hospitalization at [facility] from 4/4-4/17/21 for diabetic foot ulcer and PAD.  She has an extensive PMH significant for obesity, DM, HTN, left breast cancer, ASCVD, combined diastolic and systolic CHF, atrial fibrillation with RVR (on Eliquis), PAD s/p angiogram with DES of right superficial femoral artery on 08/22/19, left AKA on 08/25/19, and ESRD who presented to [facility] ED after HD with AMS.  In the ED she was noted to have severe hypoglycemia with glucose <10, hypotensive, and had guaiac + stools.  Her Hgb was 9.9 and was admitted for further evaluation and management of her hypoglycemia and heme + stools.  She was started on plavix and aspirin during her last hospitalization 2 weeks ago due to DES placement.  Her Eliquis was resumed 08/29/19.  We were consulted to provide HD during her hospitalization.  She did receive HD yesterday but not a full session due to arriving late and refusing to stay longer.  The RN notes from outpatient HD note that she was somnolent but arouseable and able to state that she wanted to come off early.  Past Medical History:  Diagnosis Date  . Arthritis   . Cancer [facility]) 2005    left breast  . CHF (congestive heart failure) ([place])   . Chronic back pain   . Chronic kidney disease 03/2014   dialysis t/th/sa  . Coronary artery disease   . Diabetes mellitus    Type 2  . Diabetic nephropathy ([place]) 01-08-13  . Dyslipidemia 01-08-13  . ESRD on hemodialysis ([place])    Tu, Th, Sat  . Headache   . Hyperlipidemia   . Hypertension   . LBBB (left bundle branch block)   . Proteinuria 01-08-13  . Pulmonary hypertension ([employer])    PA peak pressure 73 mmHg 08/05/17 echo  . Thyroid disease 01-08-13   Hyper-parathyroidism-secondary  . Wears glasses    Past  Surgical History:  Procedure Laterality Date  . A/V FISTULAGRAM N/A 08/25/2017   Procedure: A/V FISTULAGRAM - Left Arm;  Surgeon: Angelia Mould, MD;  Location: [place] CV LAB;  Service: Cardiovascular;  Laterality: N/A;  . A/V FISTULAGRAM N/A 02/09/2018   Procedure: A/V [id number];  Surgeon: Elam Dutch, MD;  Location: [place] CV LAB;  Service: Cardiovascular;  Laterality: N/A;  . A/V FISTULAGRAM Left 04/04/2018   Procedure: A/V FISTULAGRAM;  Surgeon: Marty Heck, MD;  Location: [place] [facility];  Service: Cardiovascular;  Laterality: Left;  . A/V FISTULAGRAM Right 07/22/2019   Procedure: A/V FISTULAGRAM - Right Arm;  Surgeon: Waynetta Sandy, MD;  Location: [place] CV LAB;  Service: Cardiovascular;  Laterality: Right;  . ABDOMINAL AORTAGRAM N/A 08/11/2014   Procedure: ABDOMINAL Maxcine Ham;  Surgeon: Angelia Mould, MD;  Location: [facility] CATH LAB;  Service: Cardiovascular;  Laterality: N/A;  . ABDOMINAL AORTOGRAM W/LOWER EXTREMITY N/A 08/22/2019   Procedure: ABDOMINAL AORTOGRAM W/LOWER EXTREMITY;  Surgeon: Serafina Mitchell, MD;  Location: [place] CV LAB;  Service: Cardiovascular;  Laterality: N/A;  . ABDOMINAL HYSTERECTOMY    . AMPUTATION Left 08/25/2019   Procedure: LEFT AMPUTATION ABOVE KNEE;  Surgeon: Newt Minion, MD;  Location: [place];  Service: Orthopedics;  Laterality: Left;  . APPENDECTOMY    . Arm surgery     Left arm trauma  . AV FISTULA PLACEMENT  Left 08/21/2014   Procedure: LEFT ARM ARTERIOVENOUS (AV) FISTULA CREATION ;  Surgeon: Angelia Mould, MD;  [facility] OR;  Service: Vascular;  Laterality: Left;  . AV FISTULA PLACEMENT Right 06/13/2018   Procedure: insertion of right arm ARTERIOVENOUS (AV) gore-tex GRAFT;  Surgeon: Rosetta Posner, MD;  Location: [place];  Service: Vascular;  Laterality: Right;  . BACK SURGERY    . CATARACT EXTRACTION W/PHACO Left 12/22/2014   Procedure: CATARACT EXTRACTION PHACO AND INTRAOCULAR LENS  PLACEMENT (IOC);  Surgeon: Tonny Branch, MD;  Location: [facility];  Service: Ophthalmology;  Laterality: Left;  CDE 13.48  . CORONARY ANGIOPLASTY  12/19/2003   inferior wall hypokinesis. ef 50%  . dialysis catheter    . INSERTION OF DIALYSIS CATHETER Right 06/13/2018   Procedure: INSERTION OF DIALYSIS CATHETER, right internal jugular;  Surgeon: Rosetta Posner, MD;  Location: [place];  Service: Vascular;  Laterality: Right;  . IR FLUORO GUIDE CV LINE RIGHT  04/06/2018  . IR REMOVAL TUN CV CATH W/O FL  04/25/2018  . IR REMOVAL TUN CV CATH W/O FL  07/20/2018  . IR US GUIDE VASC ACCESS RIGHT  04/06/2018  . LIGATION OF ARTERIOVENOUS  FISTULA Left 03/18/2019   Procedure: LIGATION OF ARTERIOVENOUS  FISTULA  LEFT ARM;  Surgeon: Angelia Mould, MD;  Location: [place];  Service: Vascular;  Laterality: Left;  [facility] Kitchen MASTECTOMY     Left  . PERIPHERAL VASCULAR BALLOON ANGIOPLASTY Left 08/25/2017   Procedure: PERIPHERAL VASCULAR BALLOON ANGIOPLASTY;  Surgeon: Angelia Mould, MD;  Location: [place] CV LAB;  Service: Cardiovascular;  Laterality: Left;  arm fistula  . PERIPHERAL VASCULAR BALLOON ANGIOPLASTY Left 02/09/2018   Procedure: PERIPHERAL VASCULAR BALLOON ANGIOPLASTY;  Surgeon: Elam Dutch, MD;  Location: [place] CV LAB;  Service: Cardiovascular;  Laterality: Left;  AV Fistula  . PERIPHERAL VASCULAR BALLOON ANGIOPLASTY Left 04/04/2018   Procedure: PERIPHERAL VASCULAR BALLOON ANGIOPLASTY;  Surgeon: Marty Heck, MD;  Location: [place] CV LAB;  Service: Cardiovascular;  Laterality: Left;  UPPER ARM FISTULA  . PERIPHERAL VASCULAR CATHETERIZATION N/A 09/16/2015   Procedure: Fistulagram;  Surgeon: Rosetta Posner, MD;  Location: [place] CV LAB;  Service: Cardiovascular;  Laterality: N/A;  . PERIPHERAL VASCULAR INTERVENTION  08/22/2019   Procedure: PERIPHERAL VASCULAR INTERVENTION;  Surgeon: Serafina Mitchell, MD;  Location: [place] CV LAB;  Service: Cardiovascular;;  Rt. SFA and Popliteal  w/Shockwavw treatment  . SPINE SURGERY    . TONSILLECTOMY    . TRANSTHORACIC ECHOCARDIOGRAM  10/01/2010    Left ventricle: The cavity size was mildly dilated. Wall thickness was increased in a pattern of mild LVH. Systolic function was   mildly reduced. The estimated ejection fraction was in the range  of 45% to 50%.   . TUBAL LIGATION     Family History:   Family History  Problem Relation Age of Onset  . Breast cancer Mother        Died age 53  . Cancer Mother 43       Breast  . Heart disease Mother   . Hyperlipidemia Mother   . Hypertension Mother   . Heart attack Mother    Social History:  reports that she quit smoking about 21 years ago. Her smoking use included cigarettes. She has a 50.00 pack-year smoking history. She has never used smokeless tobacco. She reports that she does not drink alcohol or use drugs. Allergies  Allergen Reactions  . Olmesartan Other (See Comments)  Hyperkalemia    Prior to Admission medications   Medication Sig Start Date End Date Taking? Authorizing Provider  albuterol (VENTOLIN HFA) 108 (90 Base) MCG/ACT inhaler Inhale 1-2 puffs into the lungs every 6 (six) hours as needed for wheezing or shortness of breath.   Yes [provider]  amiodarone (PACERONE) 200 MG tablet Take 1 tablet (200 mg total) by mouth daily. Patient taking differently: Take 200 mg by mouth daily. (0900) 05/30/19  Yes Strader, [place] M, PA-C  apixaban (ELIQUIS) 2.5 MG TABS tablet Take 1 tablet (2.5 mg total) by mouth 2 (two) times daily. Patient taking differently: Take 2.5 mg by mouth 2 (two) times daily. 0900 & 2100 06/13/19 07/17/20 Yes Shah, Pratik D, DO  aspirin EC 81 MG EC tablet Take 1 tablet (81 mg total) by mouth daily. 08/31/19  Yes Thurnell Lose, MD  atorvastatin (LIPITOR) 80 MG tablet Take 1 tablet (80 mg total) by mouth daily. 05/30/19  Yes Strader, [place] M, PA-C  calcitRIOL (ROCALTROL) 0.25 MCG capsule Take 0.25 mcg by mouth Every Tuesday,Thursday,and  Saturday with dialysis. (1700) 03/26/14  Yes [provider]  clopidogrel (PLAVIX) 75 MG tablet Take 1 tablet (75 mg total) by mouth daily with breakfast. 08/31/19  Yes Thurnell Lose, MD  insulin aspart (NOVOLOG) 100 UNIT/ML injection Substitute to any brand approved.Before each meal 3 times a day, 140-199 - 2 units, 200-250 - 4 units, 251-299 - 6 units,  300-349 - 8 units,  350 or above 10 units. Dispense syringes and needles as needed, Ok to switch to PEN if approved. DX DM2, Code E11.65 08/31/19  Yes Thurnell Lose, MD  midodrine (PROAMATINE) 5 MG tablet Take 1 tablet (5 mg total) by mouth 3 (three) times daily with meals. 05/30/19  Yes Strader, [place] M, PA-C  multivitamin (RENA-VIT) TABS tablet Take 1 tablet by mouth at bedtime. 08/31/19  Yes Thurnell Lose, MD  NOVOLOG MIX 70/30 FLEXPEN (70-30) 100 UNIT/ML FlexPen Inject 25-30 Units into the skin See admin instructions. 30 units in the morning & 25 units at night 06/05/19  Yes [provider]  pantoprazole (PROTONIX) 40 MG tablet Take 1 tablet (40 mg total) by mouth daily. 08/31/19  Yes Thurnell Lose, MD  sevelamer carbonate (RENVELA) 800 MG tablet Take 1,600 mg by mouth 3 (three) times daily with meals.   Yes [provider]  diclofenac Sodium (VOLTAREN) 1 % GEL Apply 2 g topically 4 (four) times daily as needed for pain. 06/26/19   [provider]  docusate sodium (COLACE) 100 MG capsule Take 100 mg by mouth every other day. Hold for loose stools    [provider]  doxycycline (VIBRA-TABS) 100 MG tablet Take 1 tablet (100 mg total) by mouth every 12 (twelve) hours for 19 days. 08/31/19 09/19/19  Thurnell Lose, MD  lidocaine-prilocaine (EMLA) cream Apply 1 application topically Every Tuesday,Thursday,and Saturday with dialysis. 1 hour prior to dialysis    [provider]  nitroGLYCERIN (NITROSTAT) 0.4 MG SL tablet Place 0.4 mg under the tongue every 5 (five) minutes x 3 doses as  needed for chest pain.     [provider]  ondansetron (ZOFRAN-ODT) 4 MG disintegrating tablet Take 4 mg by mouth every 6 (six) hours as needed for nausea or vomiting.  05/18/19   [provider]  oxyCODONE (OXY IR/ROXICODONE) 5 MG immediate release tablet Take 1 tablet (5 mg total) by mouth every 6 (six) hours as needed for moderate pain or  severe pain (pain score 4-6). 08/31/19   Thurnell Lose, MD  polyethylene glycol (MIRALAX / GLYCOLAX) packet Take 17 g by mouth daily as needed for mild constipation.     [provider]   Current Facility-Administered Medications  Medication Dose Route Frequency Provider Last Rate Last Admin  . 0.9 %  sodium chloride infusion  250 mL Intravenous PRN Oswald Hillock, MD      . acetaminophen (TYLENOL) tablet 650 mg  650 mg Oral Q6H PRN Oswald Hillock, MD       Or  . acetaminophen (TYLENOL) suppository 650 mg  650 mg Rectal Q6H PRN Darrick Meigs, Marge Duncans, MD      . albuterol (PROVENTIL) (2.5 MG/3ML) 0.083% nebulizer solution 3 mL  3 mL Inhalation Q6H PRN Oswald Hillock, MD      . amiodarone (PACERONE) tablet 200 mg  200 mg Oral Daily Darrick Meigs, Gagan S, MD      . atorvastatin (LIPITOR) tablet 80 mg  80 mg Oral Daily Lama, Gagan S, MD      . dextrose (GLUTOSE) 40 % oral gel           . doxycycline (VIBRA-TABS) tablet 100 mg  100 mg Oral Q12H Lama, Marge Duncans, MD      . insulin aspart (novoLOG) injection 0-9 Units  0-9 Units Subcutaneous Q4H Lama, Marge Duncans, MD      . midodrine (PROAMATINE) tablet 5 mg  5 mg Oral TID WC Oswald Hillock, MD   5 mg at 09/13/19 0050  . ondansetron (ZOFRAN) tablet 4 mg  4 mg Oral Q6H PRN Oswald Hillock, MD       Or  . ondansetron (ZOFRAN) injection 4 mg  4 mg Intravenous Q6H PRN Oswald Hillock, MD      . pantoprazole (PROTONIX) injection 40 mg  40 mg Intravenous Q12H Darrick Meigs, Marge Duncans, MD      . sevelamer carbonate (RENVELA) tablet 1,600 mg  1,600 mg Oral TID WC Oswald Hillock, MD      . sodium chloride flush (NS) 0.9 % injection 3 mL   3 mL Intravenous Q12H Darrick Meigs, Marge Duncans, MD      . sodium chloride flush (NS) 0.9 % injection 3 mL  3 mL Intravenous PRN Oswald Hillock, MD       Labs: Basic Metabolic Panel: Recent Labs  Lab 09/12/19 2026 09/13/19 0626  NA 130* 135  K 3.8 3.7  CL 92* 95*  CO2 25 23  GLUCOSE 79 98  BUN 17 20  CREATININE 4.49* 5.15*  CALCIUM 8.3* 8.9   Liver Function Tests: Recent Labs  Lab 09/12/19 2026 09/13/19 0626  AST 24 19  ALT 9 8  ALKPHOS 51 47  BILITOT 0.5 0.9  PROT 8.2* 7.3  ALBUMIN 2.9* 3.0*   No results for input(s): LIPASE, AMYLASE in the last 168 hours. No results for input(s): AMMONIA in the last 168 hours. CBC: Recent Labs  Lab 09/12/19 2026 09/13/19 0626  WBC 6.5 7.5  NEUTROABS 5.2  --   HGB 9.9* 9.0*  HCT 31.0* 28.3*  MCV 81.4 82.3  PLT 238 215   Cardiac Enzymes: No results for input(s): CKTOTAL, CKMB, CKMBINDEX, TROPONINI in the last 168 hours. CBG: Recent Labs  Lab 09/12/19 1754 09/12/19 1956 09/13/19 0540 09/13/19 0653 09/13/19 0740  GLUCAP 212* 86 56* 88 77   Iron Studies: No results for input(s): IRON, TIBC, TRANSFERRIN, FERRITIN in the last 72 hours. Studies/Results: No  results found.  ROS: Pertinent items are noted in HPI. Physical Exam: Vitals:   09/13/19 0153 09/13/19 0200 09/13/19 0248 09/13/19 0500  BP: (!) 85/66 90/70 121/90 (!) 96/52  Pulse: (!) 111  (!) 110 (!) 108  Resp: 20 20 20 20   Temp: 98.1 F (36.7 C)  98.2 F (36.8 C) 98.2 F (36.8 C)  TempSrc:      SpO2: 99% 99% 98% 99%  Weight:      Height:          Weight change:   Intake/Output Summary (Last 24 hours) at 09/13/2019 8676 Last data filed at 09/13/2019 0900 Gross per 24 hour  Intake 0 ml  Output --  Net 0 ml   BP (!) 96/52   Pulse (!) 108   Temp 98.2 F (36.8 C)   Resp 20   Ht 5\' 6"  (1.676 m)   Wt 85 kg   SpO2 99%   BMI 30.25 kg/m  General appearance: no distress and slowed mentation Head: Normocephalic, without obvious abnormality, atraumatic Resp:  clear to auscultation bilaterally Cardio: tachycardic, no rub GI: soft, non-tender; bowel sounds normal; no masses,  no organomegaly Extremities: s/p left AKA with some bleeding around the staples in mid portion of stump, no edema, R Forearm AVG +T/B Dialysis Access:  Dialysis Orders: [facility] T,Th,S 4.25 hr 400/600 85 kg 2.0 K/2.5 Ca R AVG -Heparin 2000 units IV initial bolus then 1000 units/hr. Stop 1 hour before end of treatment -Epogen 3600 units IV TIW -Hectorol 1.5 mcg IV TIW -Venofer 50 mg IV weekly  Assessment/Plan: 1. Altered level of consciousness- presumably due to severe hypoglycemia with CBG of <10.  Glucose has improved but still remains a little confused. 2. GI bleed- Hgb relatively stable but is on 2 antiplatelet therapies for recent PTA (occlusion of right SFA s/p DES on 08/22/19) , and Eliquis for A fib.  GI to evaluate today. 3. DM- blood sugars continue to drop.  Hold insulin and give D5 as needed. 4. ESRD -  Will plan for HD tomorrow without heparin if still here 5.  Hypertension/volume  - she is hypotensive today and may need blood transfusion if her Hgb continues to drop 6.  Anemia  -  Due to ESRD and recent surgeries.  Now heme + stool on multiple anticoagulants.  Follow H/H and transfuse as needed 7. Osteomyelitis of left calcaneus - was to take doxycycline through 09/18/19 8. PAD s/p Left AKA and PTA and DES of RSFA 2 weeks ago- on dual antiplatelet therapy.  Now with some bleeding along staples likely due to trauma.  No evidence of purulent drainage or dehiscence.   9. Atrial Fibrillation- has been on Eliquis.  Now with GI and is also on aspirin and plavix for recent DES placement.  Hold eliquis for now and curbside Cards regarding risk/benefit of continuing eliquis with dual antiplatelet therapy and heme + stools. 10. Metabolic bone disease -   Continue with home meds 11.  Nutrition - per primary  Donetta Potts, MD [place] Pager  [phone] 09/13/2019, 9:21 AM

## 2019-09-13 NOTE — Progress Notes (Signed)
Patient admitted to the hospital by Dr. Darrick Meigs earlier this morning  Patient seen and examined.  Reports that her last bowel movement was last night.  She does not have any shortness of breath, dizziness or lightheadedness.  She does have some pain in her left leg stump.  Overall stump appears to be clean and does not have any signs of infection at this time.  Assessment/plan.:  1. GI bleed.  Patient had guaiac positive stools.  She is on chronic Eliquis and dual antiplatelet therapy with aspirin and Plavix.  Eliquis currently on hold.  Continue full liquids for now.  Keep n.p.o. after midnight for possible EGD in a.m. 2. Altered mental status.  Secondary to hypoglycemia.  She received D50.  Overall blood sugars have improved. 3. Atrial fibrillation without ventricular response.  Chronically on amiodarone.  Eliquis on hold due to GI bleeding.  She is having episodes of tachycardia.  Would not give beta-blockers or calcium channel blockers in light of borderline blood pressures.  If she remains persistently tachycardic, could consider amiodarone infusion. 4. End-stage renal disease on hemodialysis, Tuesday, Thursday, Friday.  Nephrology consulted. 5. Hypertension.  Patient has chronic hypotension and is on midodrine.  Continue the same. 6. Right lower extremity osteomyelitis.  She is on chronic doxycycline with a stop date of 09/18/2019.  Follow-up with orthopedics. 7. Diabetes.  Started on sliding scale insulin.  Continue to follow blood sugars. 8. Peripheral vascular disease.  Recently had drug-eluting stent placed in the right superficial femoral-popliteal artery on 08/22/2019.  She is on dual antiplatelet therapy.  [employer]

## 2019-09-14 DIAGNOSIS — N186 End stage renal disease: Secondary | ICD-10-CM | POA: Diagnosis not present

## 2019-09-14 DIAGNOSIS — K219 Gastro-esophageal reflux disease without esophagitis: Secondary | ICD-10-CM | POA: Diagnosis not present

## 2019-09-14 DIAGNOSIS — E1151 Type 2 diabetes mellitus with diabetic peripheral angiopathy without gangrene: Secondary | ICD-10-CM | POA: Diagnosis present

## 2019-09-14 DIAGNOSIS — K922 Gastrointestinal hemorrhage, unspecified: Secondary | ICD-10-CM | POA: Diagnosis not present

## 2019-09-14 DIAGNOSIS — M86472 Chronic osteomyelitis with draining sinus, left ankle and foot: Secondary | ICD-10-CM

## 2019-09-14 DIAGNOSIS — K2970 Gastritis, unspecified, without bleeding: Secondary | ICD-10-CM | POA: Diagnosis not present

## 2019-09-14 DIAGNOSIS — D631 Anemia in chronic kidney disease: Secondary | ICD-10-CM | POA: Diagnosis not present

## 2019-09-14 DIAGNOSIS — K3189 Other diseases of stomach and duodenum: Secondary | ICD-10-CM | POA: Diagnosis not present

## 2019-09-14 DIAGNOSIS — K228 Other specified diseases of esophagus: Secondary | ICD-10-CM | POA: Diagnosis not present

## 2019-09-14 DIAGNOSIS — R2689 Other abnormalities of gait and mobility: Secondary | ICD-10-CM | POA: Diagnosis not present

## 2019-09-14 DIAGNOSIS — Z515 Encounter for palliative care: Secondary | ICD-10-CM | POA: Diagnosis not present

## 2019-09-14 DIAGNOSIS — K209 Esophagitis, unspecified without bleeding: Secondary | ICD-10-CM | POA: Diagnosis not present

## 2019-09-14 DIAGNOSIS — K31819 Angiodysplasia of stomach and duodenum without bleeding: Secondary | ICD-10-CM | POA: Diagnosis not present

## 2019-09-14 DIAGNOSIS — I5022 Chronic systolic (congestive) heart failure: Secondary | ICD-10-CM | POA: Diagnosis not present

## 2019-09-14 DIAGNOSIS — E1122 Type 2 diabetes mellitus with diabetic chronic kidney disease: Secondary | ICD-10-CM | POA: Diagnosis present

## 2019-09-14 DIAGNOSIS — E785 Hyperlipidemia, unspecified: Secondary | ICD-10-CM | POA: Diagnosis present

## 2019-09-14 DIAGNOSIS — I959 Hypotension, unspecified: Secondary | ICD-10-CM | POA: Diagnosis present

## 2019-09-14 DIAGNOSIS — M6281 Muscle weakness (generalized): Secondary | ICD-10-CM | POA: Diagnosis not present

## 2019-09-14 DIAGNOSIS — I4891 Unspecified atrial fibrillation: Secondary | ICD-10-CM | POA: Diagnosis not present

## 2019-09-14 DIAGNOSIS — I447 Left bundle-branch block, unspecified: Secondary | ICD-10-CM | POA: Diagnosis present

## 2019-09-14 DIAGNOSIS — Z1889 Other specified retained foreign body fragments: Secondary | ICD-10-CM | POA: Diagnosis not present

## 2019-09-14 DIAGNOSIS — K2100 Gastro-esophageal reflux disease with esophagitis, without bleeding: Secondary | ICD-10-CM | POA: Diagnosis not present

## 2019-09-14 DIAGNOSIS — Z794 Long term (current) use of insulin: Secondary | ICD-10-CM | POA: Diagnosis not present

## 2019-09-14 DIAGNOSIS — E1140 Type 2 diabetes mellitus with diabetic neuropathy, unspecified: Secondary | ICD-10-CM | POA: Diagnosis present

## 2019-09-14 DIAGNOSIS — L8990 Pressure ulcer of unspecified site, unspecified stage: Secondary | ICD-10-CM | POA: Diagnosis present

## 2019-09-14 DIAGNOSIS — I2510 Atherosclerotic heart disease of native coronary artery without angina pectoris: Secondary | ICD-10-CM | POA: Diagnosis not present

## 2019-09-14 DIAGNOSIS — Z992 Dependence on renal dialysis: Secondary | ICD-10-CM | POA: Diagnosis not present

## 2019-09-14 DIAGNOSIS — I5042 Chronic combined systolic (congestive) and diastolic (congestive) heart failure: Secondary | ICD-10-CM | POA: Diagnosis present

## 2019-09-14 DIAGNOSIS — F411 Generalized anxiety disorder: Secondary | ICD-10-CM | POA: Diagnosis not present

## 2019-09-14 DIAGNOSIS — Z853 Personal history of malignant neoplasm of breast: Secondary | ICD-10-CM | POA: Diagnosis not present

## 2019-09-14 DIAGNOSIS — I9589 Other hypotension: Secondary | ICD-10-CM | POA: Diagnosis present

## 2019-09-14 DIAGNOSIS — I272 Pulmonary hypertension, unspecified: Secondary | ICD-10-CM | POA: Diagnosis present

## 2019-09-14 DIAGNOSIS — E162 Hypoglycemia, unspecified: Secondary | ICD-10-CM | POA: Diagnosis not present

## 2019-09-14 DIAGNOSIS — K449 Diaphragmatic hernia without obstruction or gangrene: Secondary | ICD-10-CM | POA: Diagnosis present

## 2019-09-14 DIAGNOSIS — I5030 Unspecified diastolic (congestive) heart failure: Secondary | ICD-10-CM | POA: Diagnosis not present

## 2019-09-14 DIAGNOSIS — E118 Type 2 diabetes mellitus with unspecified complications: Secondary | ICD-10-CM

## 2019-09-14 DIAGNOSIS — Z89612 Acquired absence of left leg above knee: Secondary | ICD-10-CM | POA: Diagnosis not present

## 2019-09-14 DIAGNOSIS — K921 Melena: Secondary | ICD-10-CM | POA: Diagnosis not present

## 2019-09-14 DIAGNOSIS — E11621 Type 2 diabetes mellitus with foot ulcer: Secondary | ICD-10-CM | POA: Diagnosis not present

## 2019-09-14 DIAGNOSIS — R4182 Altered mental status, unspecified: Secondary | ICD-10-CM | POA: Diagnosis present

## 2019-09-14 DIAGNOSIS — K5521 Angiodysplasia of colon with hemorrhage: Secondary | ICD-10-CM | POA: Diagnosis not present

## 2019-09-14 DIAGNOSIS — I132 Hypertensive heart and chronic kidney disease with heart failure and with stage 5 chronic kidney disease, or end stage renal disease: Secondary | ICD-10-CM | POA: Diagnosis not present

## 2019-09-14 DIAGNOSIS — N2581 Secondary hyperparathyroidism of renal origin: Secondary | ICD-10-CM | POA: Diagnosis not present

## 2019-09-14 DIAGNOSIS — K317 Polyp of stomach and duodenum: Secondary | ICD-10-CM | POA: Diagnosis not present

## 2019-09-14 DIAGNOSIS — E11649 Type 2 diabetes mellitus with hypoglycemia without coma: Secondary | ICD-10-CM | POA: Diagnosis not present

## 2019-09-14 DIAGNOSIS — Z20822 Contact with and (suspected) exposure to covid-19: Secondary | ICD-10-CM | POA: Diagnosis not present

## 2019-09-14 DIAGNOSIS — L89322 Pressure ulcer of left buttock, stage 2: Secondary | ICD-10-CM | POA: Diagnosis present

## 2019-09-14 DIAGNOSIS — Z7901 Long term (current) use of anticoagulants: Secondary | ICD-10-CM | POA: Diagnosis not present

## 2019-09-14 DIAGNOSIS — Z7189 Other specified counseling: Secondary | ICD-10-CM | POA: Diagnosis not present

## 2019-09-14 DIAGNOSIS — Z7401 Bed confinement status: Secondary | ICD-10-CM | POA: Diagnosis not present

## 2019-09-14 DIAGNOSIS — I739 Peripheral vascular disease, unspecified: Secondary | ICD-10-CM | POA: Diagnosis not present

## 2019-09-14 DIAGNOSIS — T8744 Infection of amputation stump, left lower extremity: Secondary | ICD-10-CM | POA: Diagnosis not present

## 2019-09-14 DIAGNOSIS — I482 Chronic atrial fibrillation, unspecified: Secondary | ICD-10-CM | POA: Diagnosis present

## 2019-09-14 DIAGNOSIS — G9341 Metabolic encephalopathy: Secondary | ICD-10-CM | POA: Diagnosis not present

## 2019-09-14 LAB — BASIC METABOLIC PANEL
Anion gap: 17 — ABNORMAL HIGH (ref 5–15)
BUN: 28 mg/dL — ABNORMAL HIGH (ref 8–23)
CO2: 23 mmol/L (ref 22–32)
Calcium: 9 mg/dL (ref 8.9–10.3)
Chloride: 94 mmol/L — ABNORMAL LOW (ref 98–111)
Creatinine, Ser: 6.72 mg/dL — ABNORMAL HIGH (ref 0.44–1.00)
GFR calc Af Amer: 6 mL/min — ABNORMAL LOW (ref >60)
GFR calc non Af Amer: 6 mL/min — ABNORMAL LOW (ref >60)
Glucose, Bld: 155 mg/dL — ABNORMAL HIGH (ref 70–99)
Potassium: 4.4 mmol/L (ref 3.5–5.1)
Sodium: 134 mmol/L — ABNORMAL LOW (ref 135–145)

## 2019-09-14 LAB — GLUCOSE, CAPILLARY
Glucose-Capillary: 105 mg/dL — ABNORMAL HIGH (ref 70–99)
Glucose-Capillary: 147 mg/dL — ABNORMAL HIGH (ref 70–99)
Glucose-Capillary: 145 mg/dL — ABNORMAL HIGH (ref 70–99)
Glucose-Capillary: 145 mg/dL — ABNORMAL HIGH (ref 70–99)
Glucose-Capillary: 133 mg/dL — ABNORMAL HIGH (ref 70–99)
Glucose-Capillary: 117 mg/dL — ABNORMAL HIGH (ref 70–99)
Glucose-Capillary: 124 mg/dL — ABNORMAL HIGH (ref 70–99)

## 2019-09-14 LAB — CBC
HCT: 29.3 % — ABNORMAL LOW (ref 36.0–46.0)
Hemoglobin: 9 g/dL — ABNORMAL LOW (ref 12.0–15.0)
MCH: 25.6 pg — ABNORMAL LOW (ref 26.0–34.0)
MCHC: 30.7 g/dL (ref 30.0–36.0)
MCV: 83.2 fL (ref 80.0–100.0)
Platelets: 212 10*3/uL (ref 150–400)
RBC: 3.52 MIL/uL — ABNORMAL LOW (ref 3.87–5.11)
RDW: 19.3 % — ABNORMAL HIGH (ref 11.5–15.5)
WBC: 6.9 10*3/uL (ref 4.0–10.5)
nRBC: 0.4 % — ABNORMAL HIGH (ref 0.0–0.2)

## 2019-09-14 LAB — MRSA PCR SCREENING: MRSA by PCR: NEGATIVE

## 2019-09-14 MED ORDER — ALBUMIN HUMAN 25 % IV SOLN
25.0000 g | Freq: Once | INTRAVENOUS | Status: AC
  Administered 2019-09-14: 25 g via INTRAVENOUS
  Filled 2019-09-14: qty 100

## 2019-09-14 MED ORDER — SODIUM CHLORIDE 0.9 % IV BOLUS
500.0000 mL | Freq: Once | INTRAVENOUS | Status: AC
  Administered 2019-09-14: 500 mL via INTRAVENOUS

## 2019-09-14 MED ORDER — MIDODRINE HCL 5 MG PO TABS
10.0000 mg | ORAL_TABLET | Freq: Three times a day (TID) | ORAL | Status: DC
  Administered 2019-09-14 – 2019-09-25 (×24): 10 mg via ORAL
  Filled 2019-09-14 (×27): qty 2

## 2019-09-14 MED ORDER — SODIUM CHLORIDE 0.9% FLUSH
10.0000 mL | Freq: Two times a day (BID) | INTRAVENOUS | Status: DC
  Administered 2019-09-14 – 2019-09-20 (×7): 10 mL

## 2019-09-14 MED ORDER — PROMETHAZINE HCL 25 MG/ML IJ SOLN
6.2500 mg | Freq: Four times a day (QID) | INTRAMUSCULAR | Status: DC | PRN
  Administered 2019-09-14: 6.25 mg via INTRAVENOUS
  Filled 2019-09-14: qty 1

## 2019-09-14 MED ORDER — SODIUM CHLORIDE 0.9% FLUSH
10.0000 mL | INTRAVENOUS | Status: DC | PRN
  Administered 2019-09-15: 10 mL

## 2019-09-14 NOTE — Progress Notes (Signed)
Patient pulling at EKG wires, re direction ineffective. Safety mitts placed.

## 2019-09-14 NOTE — Progress Notes (Signed)
Patient's blood pressure remains low. This has central line placed by Dr. Arnoldo Morale and patient is receiving IV fluid bolus. Discussed with Dr. Roderic Palau and Dr. Charna Elizabeth. Will hold off on EGD enter hemodynamically stable.

## 2019-09-14 NOTE — Progress Notes (Signed)
Subjective:  Patient has been experiencing nausea.  She vomited slightly blood-tinged fluid as I walked into the room few minutes ago.  She denies chest pain shortness of breath or abdominal pain.  She received ondansetron IV around 6 AM.  No bowel movement since admission. Patient's states she had colonoscopy at [facility] about 2 years ago.  Objective: Blood pressure (!) 88/49, pulse (!) 105, temperature 98.1 F (36.7 C), temperature source Oral, resp. rate (!) 22, height 5' 6" (1.676 m), weight 84.9 kg, SpO2 100 %. Patient is alert and does not appear to be in any distress. Conjunctiva is pink. Sclera is nonicteric Oropharyngeal mucosa is normal. No neck masses or thyromegaly noted. Cardiac exam with irregular rhythm normal S1 and S2. No murmur or gallop noted. Lungs are clear to auscultation. Abdomen is full.  Bowel sounds are normal.  On palpation is soft and nontender without organomegaly or masses. She has left AKA.  Labs/studies Results:  CBC Latest Ref Rng & Units 09/14/2019 09/13/2019 09/13/2019  WBC 4.0 - 10.5 K/uL 6.9 7.3 7.5  Hemoglobin 12.0 - 15.0 g/dL 9.0(L) 8.6(L) 9.0(L)  Hematocrit 36.0 - 46.0 % 29.3(L) 27.0(L) 28.3(L)  Platelets 150 - 400 K/uL 212 205 215    CMP Latest Ref Rng & Units 09/14/2019 09/13/2019 09/12/2019  Glucose 70 - 99 mg/dL 155(H) 98 79  BUN 8 - 23 mg/dL 28(H) 20 17  Creatinine 0.44 - 1.00 mg/dL 6.72(H) 5.15(H) 4.49(H)  Sodium 135 - 145 mmol/L 134(L) 135 130(L)  Potassium 3.5 - 5.1 mmol/L 4.4 3.7 3.8  Chloride 98 - 111 mmol/L 94(L) 95(L) 92(L)  CO2 22 - 32 mmol/L 23 23 25  Calcium 8.9 - 10.3 mg/dL 9.0 8.9 8.3(L)  Total Protein 6.5 - 8.1 g/dL - 7.3 8.2(H)  Total Bilirubin 0.3 - 1.2 mg/dL - 0.9 0.5  Alkaline Phos 38 - 126 U/L - 47 51  AST 15 - 41 U/L - 19 24  ALT 0 - 44 U/L - 8 9    Hepatic Function Latest Ref Rng & Units 09/13/2019 09/12/2019 08/31/2019  Total Protein 6.5 - 8.1 g/dL 7.3 8.2(H) -  Albumin 3.5 - 5.0 g/dL 3.0(L) 2.9(L) 2.4(L)  AST 15 - 41  U/L 19 24 -  ALT 0 - 44 U/L 8 9 -  Alk Phosphatase 38 - 126 U/L 47 51 -  Total Bilirubin 0.3 - 1.2 mg/dL 0.9 0.5 -      Assessment:  #1.  Melena.  No prior history of peptic ulcer disease.  Patient is on low-dose aspirin and clopidogrel and apixaban.  Apixaban is on hold.  Less than 1 g drop in hemoglobin since admission.  Patient at risk for GERD and peptic ulcer disease.  She will need esophagogastroduodenoscopy for diagnostic purposes. Patient is on pantoprazole 40 mg IV every 12 hours.  #2.  Anemia.  Hemoglobin has dropped by less than grams since hospitalization.  She appears to have multifactorial anemia primarily due to chronic disease.  #3. Mental status changes on admission secondary to hypoglycemia.  Patient is alert and responds appropriately to questions.  #3.  End-stage renal disease.  Patient to be dialyzed later today.  #4.  Atrial fibrillation.  Eliquis is on hold because of GI bleed.  Patient remains on low-dose aspirin and low-dose.  #5.  Status post left AKA on 08/25/2019 for left foot osteomyelitis with gangrene. Patient had drug-eluting stent placed to the right superficial femoral-popliteal artery on 08/22/2019.  Recommendations  Continue pantoprazole at current dose. Await   evaluation by Dr. Roderic Palau. Possible esophagogastroduodenoscopy later today under monitored anesthesia care. Request colonoscopy records from [facility].

## 2019-09-14 NOTE — Progress Notes (Signed)
Patient ID: Alexis [name], female   DOB: Sep 14, 1945, 74 y.o.   MRN: [id number] [facility] Progress Note   Assessment/ Plan:   1.  Altered mental status: Appears to have been primarily from hypoglycemia.  Currently with waxing and waning mental status-possibly associated with recent nausea treated with promethazine. 2. Melena with hematochezia: On agents (ASA/plavix/Eliquis) that places her at high risk for bleed from GI source- seen by gastroenterology with plans for EGD when hemodynamic status improved. 2. ESRD: Usually on hemodialysis TTS schedule and will undergo hemodialysis today.  No significant evidence of volume excess. 3. Anemia: Hemoglobin and hematocrit appears to be rather stable with ongoing GI bleed.  She remains hemodynamically labile in the setting of chronic hypotension.  No indications for PRBCs. 4. CKD-MBD: Continue current sevelamer for phosphorus binding. 5. Nutrition: Continue clear liquids as directed by GI.  6. Hypotension: No corollary evidence for sepsis or significant GI blood loss; will increase midodrine to 10 mg PO TID and give 25 gm albumin bolus. Likely compounded by atrial fibrillation. 7. Osteomyelitis of right calcaneus: on PO doxycycline.    Subjective:   Hypotensive overnight, transferred to ICU.    Objective:   BP (!) 83/59   Pulse 99   Temp 98 F (36.7 C) (Oral)   Resp 20   Ht 5\' 6"  (1.676 m)   Wt 84.9 kg   SpO2 100%   BMI 30.21 kg/m   Physical Exam: MHW:[id number] comfortable resting in bed, difficult to arouse RPR:XYVOP irregular tachycardia (106), normal s1 and s2  Resp:Clear bilaterally without rales/rhonchi FYT:WKMQ, flat, non tender, bowel sounds normal KMM:NOTRR forearm loop AVG with palpable thrill. S/P left AKA with some blood oozing from stump, with right leg in clean dry gauze dressing and evidence of ischemic toes  Labs: BMET Recent Labs  Lab 09/12/19 2026 09/13/19 0626 09/14/19 0454  NA 130* 135 134*  K 3.8  3.7 4.4  CL 92* 95* 94*  CO2 25 23 23   GLUCOSE 79 98 155*  BUN 17 20 28*  CREATININE 4.49* 5.15* 6.72*  CALCIUM 8.3* 8.9 9.0   CBC Recent Labs  Lab 09/12/19 2026 09/13/19 0626 09/13/19 1350 09/14/19 0454  WBC 6.5 7.5 7.3 6.9  NEUTROABS 5.2  --   --   --   HGB 9.9* 9.0* 8.6* 9.0*  HCT 31.0* 28.3* 27.0* 29.3*  MCV 81.4 82.3 82.1 83.2  PLT 238 215 205 212     Medications:    . aspirin EC  81 mg Oral Daily  . atorvastatin  80 mg Oral Daily  . Chlorhexidine Gluconate Cloth  6 each Topical Q0600  . clopidogrel  75 mg Oral Daily  . doxycycline  100 mg Oral Q12H  . insulin aspart  0-9 Units Subcutaneous Q4H  . midodrine  5 mg Oral TID WC  . pantoprazole (PROTONIX) IV  40 mg Intravenous Q12H  . sevelamer carbonate  1,600 mg Oral TID WC  . sodium chloride flush  10-40 mL Intracatheter Q12H  . sodium chloride flush  3 mL Intravenous Q12H   Elmarie Shiley, MD 09/14/2019, 3:20 PM

## 2019-09-14 NOTE — Progress Notes (Signed)
Patient heart rate sustaining in 120s. Started amiodarone per orders.

## 2019-09-14 NOTE — Procedures (Signed)
     HEMODIALYSIS TREATMENT NOTE:   3.5 hour heparin-free HD completed via right forearm loop AVG (retrograde flow confirmed).  SBP 90-low 100s pre-dialysis and pt was under EDW.  Unable to tolerate 500cc challenge.  UF remained off for 3 hours of treatment and NS bolus was given x2 for SBP <100 (asymptomatic).  Net UF +322 cc.  All blood was returned and hemostasis was achieved in 15 minutes.  No change from pre-HD assessment.  Rockwell Alexandria, RN

## 2019-09-14 NOTE — Progress Notes (Signed)
PROGRESS NOTE    Alexis [name]  SJG:[id number] DOB: [birth year] DOA: 09/12/2019 PCP: System, Provider Not In    Brief Narrative:  74 year old female with a history of atrial fibrillation on anticoagulation, end-stage renal disease, coronary artery disease, peripheral vascular disease, diabetes, was recently discharged from the hospital on 4/17 after she underwent left above-the-knee amputation and was started on dual antiplatelet therapy for drug-eluting stent placed in her right lower extremity.  She was admitted to the hospital with severe hypoglycemia.  She was cool and clammy and hemodialysis.  Further work-up showed that she was hypotensive and guaiac positive.  She was admitted for further work-up of GI bleeding..   Assessment & Plan:   Active Problems:   Anemia in chronic kidney disease (CKD)   Type II diabetes mellitus with manifestations ([place])   ESRD on dialysis [facility])   Atrial fibrillation with rapid ventricular response (HCC)   Osteomyelitis (HCC)   GI bleed   Melena   Hx of AKA (above knee amputation), left (HCC)   Pressure injury of skin   Hypotension   1. GI bleed.  Patient had guaiac positive stools.  She is on chronic Eliquis and dual antiplatelet therapy with aspirin and Plavix.  Eliquis currently on hold.    She is being seen by GI and plans are for EGD once her hemodynamics have stabilized 2. Anemia of chronic kidney disease.  Hemoglobin is currently stable.  She is not had a significant decline due to GI bleeding.  Continue to monitor. 3. Altered mental status.  Secondary to hypoglycemia.  She received D50.  Overall blood sugars have improved.  Mental status is also improved. 4. Atrial fibrillation without ventricular response.  Chronically on amiodarone.  Eliquis on hold due to GI bleeding.  Currently on amiodarone infusion for rapid A. fib.  Heart rate is currently uncontrolled. 5. End-stage renal disease on hemodialysis, Tuesday, Thursday, Friday.  Nephrology  consulted. 6. Hypotension.  Patient has chronic hypotension and is on midodrine.  She continues to have low blood pressures which appear to be lower than her baseline.  Midodrine dose has been increased.  She also received a dose of albumin today.  May need more volume.  Central line placed in case pressors are needed.  She does not appear septic at this time. 7. Right lower extremity chronic osteomyelitis.  She is on chronic doxycycline with a stop date of 09/18/2019.  Follow-up with orthopedics. 8. Diabetes.  Started on sliding scale insulin.  Blood sugars have been stable. 9. Peripheral vascular disease.  Recently had drug-eluting stent placed in the right superficial femoral-popliteal artery on 08/22/2019.  She is on dual antiplatelet therapy.   DVT prophylaxis: SCDs Code Status: Full code Family Communication: Updated daughter over the phone Disposition Plan: Status is: Inpatient  Remains inpatient appropriate because:Inpatient level of care appropriate due to severity of illness.  Patient remains critically ill on amiodarone infusion and needs further inpatient stay for work-up of GI bleeding.  He was   Dispo: The patient is from: Home              Anticipated d/c is to: Home              Anticipated d/c date is: 2 days              Patient currently is not medically stable to d/c.  Consultants:   Gastroenterology  Nephrology  General surgery  Procedures:   5/1 right femoral vein central line  Antimicrobials:       Subjective: Patient having nausea, dry heaves this morning.  Remains tachycardic with low blood pressures overnight.  No further dark/bloody bowel movements.  She did have some blood-tinged vomitus this morning  Objective: Vitals:   09/14/19 1219 09/14/19 1300 09/14/19 1400 09/14/19 1700  BP:      Pulse:  93 99   Resp:  20    Temp: 98 F (36.7 C)   97.6 F (36.4 C)  TempSrc: Oral     SpO2:  100%    Weight:      Height:        Intake/Output Summary  (Last 24 hours) at 09/14/2019 1923 Last data filed at 09/14/2019 0815 Gross per 24 hour  Intake 198.98 ml  Output --  Net 198.98 ml   Filed Weights   09/12/19 1721 09/14/19 0417  Weight: 85 kg 84.9 kg    Examination:  General exam: Appears calm and comfortable  Respiratory system: Clear to auscultation. Respiratory effort normal. Cardiovascular system: S1 & S2 heard, RRR. No JVD, murmurs, rubs, gallops or clicks. No pedal edema. Gastrointestinal system: Abdomen is nondistended, soft and nontender. No organomegaly or masses felt. Normal bowel sounds heard. Central nervous system: Alert and oriented. No focal neurological deficits. Extremities: Left lower extremity above the knee amputation.  Stump incision appears to be clean. Skin: No rashes, lesions or ulcers Psychiatry: Judgement and insight appear normal. Mood & affect appropriate.     Data Reviewed: I have personally reviewed following labs and imaging studies  CBC: Recent Labs  Lab 09/12/19 2026 09/13/19 0626 09/13/19 1350 09/14/19 0454  WBC 6.5 7.5 7.3 6.9  NEUTROABS 5.2  --   --   --   HGB 9.9* 9.0* 8.6* 9.0*  HCT 31.0* 28.3* 27.0* 29.3*  MCV 81.4 82.3 82.1 83.2  PLT 238 215 205 846   Basic Metabolic Panel: Recent Labs  Lab 09/12/19 2026 09/13/19 0626 09/14/19 0454  NA 130* 135 134*  K 3.8 3.7 4.4  CL 92* 95* 94*  CO2 25 23 23   GLUCOSE 79 98 155*  BUN 17 20 28*  CREATININE 4.49* 5.15* 6.72*  CALCIUM 8.3* 8.9 9.0   GFR: Estimated Creatinine Clearance: 8.2 mL/min (A) (by C-G formula based on SCr of 6.72 mg/dL (H)). Liver Function Tests: Recent Labs  Lab 09/12/19 2026 09/13/19 0626  AST 24 19  ALT 9 8  ALKPHOS 51 47  BILITOT 0.5 0.9  PROT 8.2* 7.3  ALBUMIN 2.9* 3.0*   No results for input(s): LIPASE, AMYLASE in the last 168 hours. No results for input(s): AMMONIA in the last 168 hours. Coagulation Profile: No results for input(s): INR, PROTIME in the last 168 hours. Cardiac Enzymes: No  results for input(s): CKTOTAL, CKMB, CKMBINDEX, TROPONINI in the last 168 hours. BNP (last 3 results) No results for input(s): PROBNP in the last 8760 hours. HbA1C: Recent Labs    09/12/19 2026  HGBA1C 5.7*   CBG: Recent Labs  Lab 09/14/19 0212 09/14/19 0617 09/14/19 0826 09/14/19 1137 09/14/19 1647  GLUCAP 105* 147* 145* 145* 133*   Lipid Profile: No results for input(s): CHOL, HDL, LDLCALC, TRIG, CHOLHDL, LDLDIRECT in the last 72 hours. Thyroid Function Tests: No results for input(s): TSH, T4TOTAL, FREET4, T3FREE, THYROIDAB in the last 72 hours. Anemia Panel: No results for input(s): VITAMINB12, FOLATE, FERRITIN, TIBC, IRON, RETICCTPCT in the last 72 hours. Sepsis Labs: No results for input(s): PROCALCITON, LATICACIDVEN in the last 168 hours.  Recent Results (from  the past 240 hour(s))  Respiratory Panel by RT PCR (Flu A&B, Covid) - Nasopharyngeal Swab     Status: None   Collection Time: 09/12/19 10:19 PM   Specimen: Nasopharyngeal Swab  Result Value Ref Range Status   SARS Coronavirus 2 by RT PCR NEGATIVE NEGATIVE Final    Comment: (NOTE) SARS-CoV-2 target nucleic acids are NOT DETECTED. The SARS-CoV-2 RNA is generally detectable in upper respiratoy specimens during the acute phase of infection. The lowest concentration of SARS-CoV-2 viral copies this assay can detect is 131 copies/mL. A negative result does not preclude SARS-Cov-2 infection and should not be used as the sole basis for treatment or other patient management decisions. A negative result may occur with  improper specimen collection/handling, submission of specimen other than nasopharyngeal swab, presence of viral mutation(s) within the areas targeted by this assay, and inadequate number of viral copies (<131 copies/mL). A negative result must be combined with clinical observations, patient history, and epidemiological information. The expected result is Negative. Fact Sheet for Patients:   PinkCheek.be Fact Sheet for Healthcare Providers:  GravelBags.it This test is not yet ap proved or cleared by the [place] FDA and  has been authorized for detection and/or diagnosis of SARS-CoV-2 by FDA under an Emergency Use Authorization (EUA). This EUA will remain  in effect (meaning this test can be used) for the duration of the COVID-19 declaration under Section 564(b)(1) of the Act, 21 U.S.C. section 360bbb-3(b)(1), unless the authorization is terminated or revoked sooner.    Influenza A by PCR NEGATIVE NEGATIVE Final   Influenza B by PCR NEGATIVE NEGATIVE Final    Comment: (NOTE) The Xpert Xpress SARS-CoV-2/FLU/RSV assay is intended as an aid in  the diagnosis of influenza from Nasopharyngeal swab specimens and  should not be used as a sole basis for treatment. Nasal washings and  aspirates are unacceptable for Xpert Xpress SARS-CoV-2/FLU/RSV  testing. Fact Sheet for Patients: PinkCheek.be Fact Sheet for Healthcare Providers: GravelBags.it This test is not yet approved or cleared by the [place] FDA and  has been authorized for detection and/or diagnosis of SARS-CoV-2 by  FDA under an Emergency Use Authorization (EUA). This EUA will remain  in effect (meaning this test can be used) for the duration of the  Covid-19 declaration under Section 564(b)(1) of the Act, 21  U.S.C. section 360bbb-3(b)(1), unless the authorization is  terminated or revoked. Performed at [facility], [address], [place], [place] [address]   MRSA PCR Screening     Status: None   Collection Time: 09/14/19  3:36 AM   Specimen: Nasal Mucosa; Nasopharyngeal  Result Value Ref Range Status   MRSA by PCR NEGATIVE NEGATIVE Final    Comment:        The GeneXpert MRSA Assay (FDA approved for NASAL specimens only), is one component of a comprehensive MRSA  colonization surveillance program. It is not intended to diagnose MRSA infection nor to guide or monitor treatment for MRSA infections. Performed at [facility], [address] [place], [place] [address]          Radiology Studies: No results found.      Scheduled Meds: . aspirin EC  81 mg Oral Daily  . atorvastatin  80 mg Oral Daily  . Chlorhexidine Gluconate Cloth  6 each Topical Q0600  . clopidogrel  75 mg Oral Daily  . doxycycline  100 mg Oral Q12H  . insulin aspart  0-9 Units Subcutaneous Q4H  . midodrine  10 mg Oral TID  WC  . pantoprazole (PROTONIX) IV  40 mg Intravenous Q12H  . sevelamer carbonate  1,600 mg Oral TID WC  . sodium chloride flush  10-40 mL Intracatheter Q12H  . sodium chloride flush  3 mL Intravenous Q12H   Continuous Infusions: . sodium chloride    . amiodarone 30 mg/hr (09/14/19 1824)  . sodium chloride       LOS: 0 days    Critical care procedure note Authorized and performed by: Kathie Dike Total critical care time: Approximately 30 minutes Due to high probability of clinically significant, life-threatening deterioration, the patient required my highest level of preparedness to intervene emergently and I personally spent this critical care time directly and personally managing the patient.  The critical care time included obtaining a history, examining the patient, pulse oximetry, ordering and review of studies, arranging urgent treatment with development of a management plan, evaluation of patient's response to treatment, frequent reassessment, discussions with other providers.  Critical care time was performed to assess and manage the high probability of imminent, life-threatening deterioration that could result in multiorgan failure.  It was exclusive of separate billable procedures and treating other patients and teaching time.  Please see MDM section and the rest of the of note for further information on patient assessment and  treatment    Kathie Dike, MD [facility]   If 7PM-7AM, please contact night-coverage [email or web address]  09/14/2019, 7:23 PM

## 2019-09-14 NOTE — Progress Notes (Signed)
Patient states vomited up thick white phlegm. Complained of nausea. PRN zofran given per orders

## 2019-09-14 NOTE — Progress Notes (Signed)
Patient is agitated with staff, demanding gloves off. Unsuccessful trial patient began pulling at heart monitor wires. Redirection not successful.

## 2019-09-14 NOTE — Consult Note (Signed)
Patient:  CAYDEN RAUTIO  DOB:  09/08/1945  MRN:  [id number]   Preop Diagnosis: End-stage renal disease, need for central venous access, hypotension  Postop Diagnosis: Same  Procedure: Central line insertion  Surgeon: Aviva Signs, MD  Anes: Local  Indications: Patient is a 74 year old black female with multiple medical problems including end-stage renal disease, GI bleed, and chronic hypotension who is in need of central venous access.  Both upper extremities are involved with vascular access.  It was elected to proceed with a right femoral vein insertion.  She is status post left lower extremity amputation recently.  The risks and benefits of the procedure were fully explained to the patient, who gave informed consent.  Procedure note: Procedure was done at bedside in ICU bed 1.  Informed consent was obtained.  Timeout performed.  Full sterile technique was used.  The right groin was prepped and draped using usual sterile technique with ChloraPrep.  1% Xylocaine was used for local anesthesia.  A needle was advanced into the right femoral vein using the Seldinger technique without difficulty.  A guidewire was then advanced easily.  The tract was dilated and a triple-lumen catheter was inserted.  Good backflow of venous blood was noted on aspiration of all 3 ports.  All 3 ports were flushed with saline.  It was secured to the skin using 3-0 silk sutures.  Dry sterile dressing was applied.  The patient tolerated the procedure well.  Complications: None  EBL: Minimal  Specimen: None

## 2019-09-14 NOTE — Progress Notes (Signed)
0800 pt nauseous and vomiting, unable to take PO meds.   1000 Dr. Arnoldo Morale placed a CVC to right groin at bedside.  534ml NS bolus given, Phenergan 6.25 IV given and midodrine 5mg  PO given for low BP.  Tolerated well.  Amiodarone rate adjusted to 30 mg/hr.   1115 HR 110, BP 71/55, RR 18, SpO2 99%RA, no complaint of n/v.

## 2019-09-15 ENCOUNTER — Encounter (HOSPITAL_COMMUNITY)
Admission: EM | Disposition: A | Discharge status: Skilled Nursing Facility | Payer: Self-pay | Attending: Family Medicine

## 2019-09-15 DIAGNOSIS — K3189 Other diseases of stomach and duodenum: Secondary | ICD-10-CM

## 2019-09-15 DIAGNOSIS — K317 Polyp of stomach and duodenum: Secondary | ICD-10-CM

## 2019-09-15 DIAGNOSIS — K228 Other specified diseases of esophagus: Secondary | ICD-10-CM

## 2019-09-15 DIAGNOSIS — K31819 Angiodysplasia of stomach and duodenum without bleeding: Secondary | ICD-10-CM

## 2019-09-15 HISTORY — PX: ESOPHAGOGASTRODUODENOSCOPY: SHX5428

## 2019-09-15 LAB — BASIC METABOLIC PANEL
Anion gap: 13 (ref 5–15)
BUN: 13 mg/dL (ref 8–23)
CO2: 26 mmol/L (ref 22–32)
Calcium: 8.6 mg/dL — ABNORMAL LOW (ref 8.9–10.3)
Chloride: 95 mmol/L — ABNORMAL LOW (ref 98–111)
Creatinine, Ser: 3.82 mg/dL — ABNORMAL HIGH (ref 0.44–1.00)
GFR calc Af Amer: 13 mL/min — ABNORMAL LOW (ref >60)
GFR calc non Af Amer: 11 mL/min — ABNORMAL LOW (ref >60)
Glucose, Bld: 80 mg/dL (ref 70–99)
Potassium: 3.2 mmol/L — ABNORMAL LOW (ref 3.5–5.1)
Sodium: 134 mmol/L — ABNORMAL LOW (ref 135–145)

## 2019-09-15 LAB — CBC
HCT: 28.5 % — ABNORMAL LOW (ref 36.0–46.0)
Hemoglobin: 8.9 g/dL — ABNORMAL LOW (ref 12.0–15.0)
MCH: 25.5 pg — ABNORMAL LOW (ref 26.0–34.0)
MCHC: 31.2 g/dL (ref 30.0–36.0)
MCV: 81.7 fL (ref 80.0–100.0)
Platelets: 176 10*3/uL (ref 150–400)
RBC: 3.49 MIL/uL — ABNORMAL LOW (ref 3.87–5.11)
RDW: 18.9 % — ABNORMAL HIGH (ref 11.5–15.5)
WBC: 6.4 10*3/uL (ref 4.0–10.5)
nRBC: 0.3 % — ABNORMAL HIGH (ref 0.0–0.2)

## 2019-09-15 LAB — KOH PREP: KOH Prep: NONE SEEN

## 2019-09-15 LAB — GLUCOSE, CAPILLARY
Glucose-Capillary: 84 mg/dL (ref 70–99)
Glucose-Capillary: 80 mg/dL (ref 70–99)
Glucose-Capillary: 90 mg/dL (ref 70–99)
Glucose-Capillary: 85 mg/dL (ref 70–99)
Glucose-Capillary: 127 mg/dL — ABNORMAL HIGH (ref 70–99)

## 2019-09-15 SURGERY — EGD (ESOPHAGOGASTRODUODENOSCOPY)
Anesthesia: Moderate Sedation

## 2019-09-15 MED ORDER — AMIODARONE HCL 200 MG PO TABS
200.0000 mg | ORAL_TABLET | Freq: Every day | ORAL | Status: DC
  Administered 2019-09-16 – 2019-09-25 (×8): 200 mg via ORAL
  Filled 2019-09-15 (×12): qty 1

## 2019-09-15 MED ORDER — MIDAZOLAM HCL 5 MG/5ML IJ SOLN
INTRAMUSCULAR | Status: DC | PRN
  Administered 2019-09-15 (×2): 1 mg via INTRAVENOUS

## 2019-09-15 MED ORDER — MIDAZOLAM HCL 5 MG/5ML IJ SOLN
INTRAMUSCULAR | Status: AC
  Filled 2019-09-15: qty 10

## 2019-09-15 MED ORDER — LIDOCAINE-PRILOCAINE 2.5-2.5 % EX CREA: 1.0000 "application " | TOPICAL_CREAM | CUTANEOUS | Status: DC | PRN

## 2019-09-15 MED ORDER — METOCLOPRAMIDE HCL 5 MG/ML IJ SOLN
5.0000 mg | Freq: Four times a day (QID) | INTRAMUSCULAR | Status: AC
  Administered 2019-09-15 – 2019-09-16 (×3): 5 mg via INTRAVENOUS
  Filled 2019-09-15 (×3): qty 2

## 2019-09-15 MED ORDER — LIDOCAINE HCL (PF) 1 % IJ SOLN: 5.0000 mL | INTRAMUSCULAR | Status: DC | PRN

## 2019-09-15 MED ORDER — FENTANYL CITRATE (PF) 100 MCG/2ML IJ SOLN
INTRAMUSCULAR | Status: AC
  Filled 2019-09-15: qty 2

## 2019-09-15 MED ORDER — SODIUM CHLORIDE 0.9 % IV SOLN: 100.0000 mL | INTRAVENOUS | Status: DC | PRN

## 2019-09-15 MED ORDER — MEPERIDINE HCL 50 MG/ML IJ SOLN
INTRAMUSCULAR | Status: AC
  Filled 2019-09-15: qty 1

## 2019-09-15 MED ORDER — LIDOCAINE VISCOUS HCL 2 % MT SOLN
OROMUCOSAL | Status: AC
  Filled 2019-09-15: qty 15

## 2019-09-15 MED ORDER — PENTAFLUOROPROP-TETRAFLUOROETH EX AERO: 1.0000 "application " | INHALATION_SPRAY | CUTANEOUS | Status: DC | PRN

## 2019-09-15 MED ORDER — INSULIN ASPART 100 UNIT/ML ~~LOC~~ SOLN
0.0000 [IU] | Freq: Three times a day (TID) | SUBCUTANEOUS | Status: DC
  Administered 2019-09-15 – 2019-09-24 (×8): 1 [IU] via SUBCUTANEOUS

## 2019-09-15 MED ORDER — FENTANYL CITRATE (PF) 100 MCG/2ML IJ SOLN
INTRAMUSCULAR | Status: DC | PRN
  Administered 2019-09-15: 25 ug via INTRAVENOUS

## 2019-09-15 NOTE — Progress Notes (Signed)
Brief EGD note.  Normal hypopharynx. Normal mucosa proximal and middle third of the esophagus. Thick coating of mucosa at distal esophagus and GE junction.?  Dissolved pills ?  Candida. Brushing taken for KOH. Small gastric polyp just below cardia covered with whitish material without active bleeding. Patchy antral erythema but no evidence of ulcer. Normal bulbar mucosa. Single tiny post bulbar AV malformation without bleeding.

## 2019-09-15 NOTE — Progress Notes (Signed)
PROGRESS NOTE    Alexis [name]  IWP:[id number] DOB: 06/19/1945 DOA: 09/12/2019 PCP: System, Provider Not In    Brief Narrative:  74 year old female with a history of atrial fibrillation on anticoagulation, end-stage renal disease, coronary artery disease, peripheral vascular disease, diabetes, was recently discharged from the hospital on 4/17 after she underwent left above-the-knee amputation and was started on dual antiplatelet therapy for drug-eluting stent placed in her right lower extremity.  She was admitted to the hospital with severe hypoglycemia.  She was cool and clammy and hemodialysis.  Further work-up showed that she was hypotensive and guaiac positive.  She was admitted for further work-up of GI bleeding..   Assessment & Plan:   Active Problems:   Anemia in chronic kidney disease (CKD)   Type II diabetes mellitus with manifestations ([place])   ESRD on dialysis [facility])   Atrial fibrillation with rapid ventricular response (HCC)   Osteomyelitis (HCC)   GI bleed   Melena   Hx of AKA (above knee amputation), left (HCC)   Pressure injury of skin   Hypotension   1. GI bleed.  Patient had guaiac positive stools.  She is on chronic Eliquis and dual antiplatelet therapy with aspirin and Plavix.  Eliquis currently on hold.    She was seen by gastroenterology and underwent EGD on 5/2 that did not show any signs of active bleeding.  Will defer need for colonoscopy to gastroenterology. 2. Anemia of chronic kidney disease.  Hemoglobin is currently stable.  She is not had a significant decline due to GI bleeding.  Continue to monitor. 3. Altered mental status.  Secondary to hypoglycemia.  She received D50.  Overall blood sugars have improved.  Mental status is also improved. 4. Atrial fibrillation without ventricular response.  Chronically on amiodarone.  Eliquis on hold due to GI bleeding.  Currently on amiodarone infusion for rapid A. fib.  Heart rates are now improved.  Will discontinue  amiodarone infusion and start back on home dose of oral amiodarone. 5. End-stage renal disease on hemodialysis, Tuesday, Thursday, Friday.  Nephrology following. 6. Hypotension.  Patient has chronic hypotension and is on midodrine.  Midodrine dose was increased yesterday to 10 mg 3 times daily.  Overall blood pressures are improved.. 7. Right lower extremity chronic osteomyelitis.  She is on chronic doxycycline with a stop date of 09/18/2019.  Follow-up with orthopedics. 8. Diabetes.  Started on sliding scale insulin.  Blood sugars have been stable. 9. Peripheral vascular disease.  Recently had drug-eluting stent placed in the right superficial femoral-popliteal artery on 08/22/2019.  She is on dual antiplatelet therapy.   DVT prophylaxis: SCDs Code Status: Full code Family Communication: Updated daughter over the phone Disposition Plan: Status is: Inpatient  Remains inpatient appropriate because:Inpatient level of care appropriate due to severity of illness.  Patient may need further GI work-up including colonoscopy pending further GI input.   Dispo: The patient is from: Home              Anticipated d/c is to: Home              Anticipated d/c date is: 2 days              Patient currently is not medically stable to d/c.  Consultants:   Gastroenterology  Nephrology  General surgery  Procedures:   5/1 right femoral vein central line 5/2 EGD: Normal hypopharynx.                           -  Normal proximal esophagus and mid esophagus.                           - Mucosal coating at distal esophagus. ? candida                            esophagits. ? dissolved pill. Cells for cytology                            obtained.                           - Z-line irregular, 39 cm from the incisors.                           - A single gastric polyp.                           - Erythematous mucosa in the antrum.                           - Normal duodenal bulb.                           - A  single non-bleeding angiodysplastic lesion in                             the duodenum  Antimicrobials:       Subjective: No bloody bowel movements overnight.  Denies any chest pain or shortness of breath.  Objective: Vitals:   09/15/19 1530 09/15/19 1600 09/15/19 1700 09/15/19 1800  BP: (!) 101/54 (!) 98/58 99/78 100/67  Pulse: 69 71 72   Resp: 19 18 (!) 21 19  Temp:   98.7 F (37.1 C)   TempSrc:   Oral   SpO2: 96% 93% 94%   Weight:      Height:        Intake/Output Summary (Last 24 hours) at 09/15/2019 1844 Last data filed at 09/15/2019 1800 Gross per 24 hour  Intake 729.69 ml  Output 324 ml  Net 405.69 ml   Filed Weights   09/12/19 1721 09/14/19 0417 09/14/19 2245  Weight: 85 kg 84.9 kg 84.6 kg    Examination:  General exam: Alert, awake, oriented x 3 Respiratory system: Clear to auscultation. Respiratory effort normal. Cardiovascular system: Irregular. No murmurs, rubs, gallops. Gastrointestinal system: Abdomen is nondistended, soft and nontender. No organomegaly or masses felt. Normal bowel sounds heard. Central nervous system: Alert and oriented. No focal neurological deficits. Extremities: Left above-the-knee amputation Skin: No rashes, lesions or ulcers Psychiatry: Judgement and insight appear normal. Mood & affect appropriate.      Data Reviewed: I have personally reviewed following labs and imaging studies  CBC: Recent Labs  Lab 09/12/19 2026 09/13/19 0626 09/13/19 1350 09/14/19 0454 09/15/19 0347  WBC 6.5 7.5 7.3 6.9 6.4  NEUTROABS 5.2  --   --   --   --   HGB 9.9* 9.0* 8.6* 9.0* 8.9*  HCT 31.0* 28.3* 27.0* 29.3* 28.5*  MCV 81.4 82.3 82.1 83.2 81.7  PLT 238 215 205 212 299   Basic Metabolic Panel: Recent Labs  Lab 09/12/19 2026 09/13/19 0626 09/14/19 0454 09/15/19 0347  NA 130* 135 134* 134*  K 3.8 3.7 4.4 3.2*  CL 92* 95* 94* 95*  CO2 25 23 23 26   GLUCOSE 79 98 155* 80  BUN 17 20 28* 13  CREATININE 4.49* 5.15* 6.72* 3.82*    CALCIUM 8.3* 8.9 9.0 8.6*   GFR: Estimated Creatinine Clearance: 14.4 mL/min (A) (by C-G formula based on SCr of 3.82 mg/dL (H)). Liver Function Tests: Recent Labs  Lab 09/12/19 2026 09/13/19 0626  AST 24 19  ALT 9 8  ALKPHOS 51 47  BILITOT 0.5 0.9  PROT 8.2* 7.3  ALBUMIN 2.9* 3.0*   No results for input(s): LIPASE, AMYLASE in the last 168 hours. No results for input(s): AMMONIA in the last 168 hours. Coagulation Profile: No results for input(s): INR, PROTIME in the last 168 hours. Cardiac Enzymes: No results for input(s): CKTOTAL, CKMB, CKMBINDEX, TROPONINI in the last 168 hours. BNP (last 3 results) No results for input(s): PROBNP in the last 8760 hours. HbA1C: Recent Labs    09/12/19 2026  HGBA1C 5.7*   CBG: Recent Labs  Lab 09/15/19 0138 09/15/19 0534 09/15/19 0733 09/15/19 1159 09/15/19 1619  GLUCAP 84 80 90 85 127*   Lipid Profile: No results for input(s): CHOL, HDL, LDLCALC, TRIG, CHOLHDL, LDLDIRECT in the last 72 hours. Thyroid Function Tests: No results for input(s): TSH, T4TOTAL, FREET4, T3FREE, THYROIDAB in the last 72 hours. Anemia Panel: No results for input(s): VITAMINB12, FOLATE, FERRITIN, TIBC, IRON, RETICCTPCT in the last 72 hours. Sepsis Labs: No results for input(s): PROCALCITON, LATICACIDVEN in the last 168 hours.  Recent Results (from the past 240 hour(s))  Respiratory Panel by RT PCR (Flu A&B, Covid) - Nasopharyngeal Swab     Status: None   Collection Time: 09/12/19 10:19 PM   Specimen: Nasopharyngeal Swab  Result Value Ref Range Status   SARS Coronavirus 2 by RT PCR NEGATIVE NEGATIVE Final    Comment: (NOTE) SARS-CoV-2 target nucleic acids are NOT DETECTED. The SARS-CoV-2 RNA is generally detectable in upper respiratoy specimens during the acute phase of infection. The lowest concentration of SARS-CoV-2 viral copies this assay can detect is 131 copies/mL. A negative result does not preclude SARS-Cov-2 infection and should not be  used as the sole basis for treatment or other patient management decisions. A negative result may occur with  improper specimen collection/handling, submission of specimen other than nasopharyngeal swab, presence of viral mutation(s) within the areas targeted by this assay, and inadequate number of viral copies (<131 copies/mL). A negative result must be combined with clinical observations, patient history, and epidemiological information. The expected result is Negative. Fact Sheet for Patients:  PinkCheek.be Fact Sheet for Healthcare Providers:  GravelBags.it This test is not yet ap proved or cleared by the [place] FDA and  has been authorized for detection and/or diagnosis of SARS-CoV-2 by FDA under an Emergency Use Authorization (EUA). This EUA will remain  in effect (meaning this test can be used) for the duration of the COVID-19 declaration under Section 564(b)(1) of the Act, 21 U.S.C. section 360bbb-3(b)(1), unless the authorization is terminated or revoked sooner.    Influenza A by PCR NEGATIVE NEGATIVE Final   Influenza B by PCR NEGATIVE NEGATIVE Final    Comment: (NOTE) The Xpert Xpress SARS-CoV-2/FLU/RSV assay is intended as an aid in  the diagnosis of influenza from Nasopharyngeal swab specimens and  should not be used as a sole basis for treatment. Nasal washings and  aspirates are unacceptable for Xpert Xpress SARS-CoV-2/FLU/RSV  testing. Fact Sheet for Patients: PinkCheek.be Fact Sheet for Healthcare Providers: GravelBags.it This test is not yet approved or cleared by the [place] FDA and  has been authorized for detection and/or diagnosis of SARS-CoV-2 by  FDA under an Emergency Use Authorization (EUA). This EUA will remain  in effect (meaning this test can be used) for the duration of the  Covid-19 declaration under Section 564(b)(1) of the  Act, 21  U.S.C. section 360bbb-3(b)(1), unless the authorization is  terminated or revoked. Performed at [facility], [address], [place], [place] [address]   MRSA PCR Screening     Status: None   Collection Time: 09/14/19  3:36 AM   Specimen: Nasal Mucosa; Nasopharyngeal  Result Value Ref Range Status   MRSA by PCR NEGATIVE NEGATIVE Final    Comment:        The GeneXpert MRSA Assay (FDA approved for NASAL specimens only), is one component of a comprehensive MRSA colonization surveillance program. It is not intended to diagnose MRSA infection nor to guide or monitor treatment for MRSA infections. Performed at [facility], [address], [place], [place] [address]   [place] prep     Status: None   Collection Time: 09/15/19 12:37 PM   Specimen: PATH GI Other  Result Value Ref Range Status   Specimen Description ESOPHAGUS  Final   Special Requests NONE  Final   KOH Prep   Final    NO YEAST OR FUNGAL ELEMENTS SEEN Performed at [facility], [address], [place], [place] [address]    Report Status 09/15/2019 FINAL  Final         Radiology Studies: No results found.      Scheduled Meds: . aspirin EC  81 mg Oral Daily  . atorvastatin  80 mg Oral Daily  . Chlorhexidine Gluconate Cloth  6 each Topical Q0600  . clopidogrel  75 mg Oral Daily  . doxycycline  100 mg Oral Q12H  . fentaNYL      . insulin aspart  0-9 Units Subcutaneous TID WC  . lidocaine      . metoCLOPramide (REGLAN) injection  5 mg Intravenous Q6H  . midazolam      . midodrine  10 mg Oral TID WC  . pantoprazole (PROTONIX) IV  40 mg Intravenous Q12H  . sevelamer carbonate  1,600 mg Oral TID WC  . sodium chloride flush  10-40 mL Intracatheter Q12H  . sodium chloride flush  3 mL Intravenous Q12H   Continuous Infusions: . sodium chloride 250 mL (09/14/19 2100)  . sodium chloride    . sodium chloride    . sodium chloride       LOS: 1 day    Time spent: 21mins    Kathie Dike, MD [facility]   If 7PM-7AM, please contact night-coverage [email or web address]  09/15/2019, 6:44 PM

## 2019-09-15 NOTE — Progress Notes (Signed)
Subjective:  Patient has no complaints other than that she is starving.  She states she is hungry.  According to nursing staff she did get sick when she took pills this morning.  Patient states she has not had a bowel movement since admission because she is not eating.  She denies chest pain shortness of breath or abdominal pain.  She refuses to undergo esophagogastroduodenoscopy stating that she had this procedure along with colonoscopy at [facility].   Current Facility-Administered Medications:  .  0.9 %  sodium chloride infusion, 250 mL, Intravenous, PRN, Oswald Hillock, MD, Last Rate: 5 mL/hr at 09/14/19 2100, 250 mL at 09/14/19 2100 .  0.9 %  sodium chloride infusion, 100 mL, Intravenous, PRN, Donato Heinz, MD .  0.9 %  sodium chloride infusion, 100 mL, Intravenous, PRN, Donato Heinz, MD .  acetaminophen (TYLENOL) tablet 650 mg, 650 mg, Oral, Q6H PRN **OR** acetaminophen (TYLENOL) suppository 650 mg, 650 mg, Rectal, Q6H PRN, Darrick Meigs, Marge Duncans, MD .  albuterol (PROVENTIL) (2.5 MG/3ML) 0.083% nebulizer solution 3 mL, 3 mL, Inhalation, Q6H PRN, Darrick Meigs, Marge Duncans, MD .  [COMPLETED] amiodarone (NEXTERONE) 1.8 mg/mL load via infusion 150 mg, 150 mg, Intravenous, Once, 150 mg at 09/14/19 0403 **FOLLOWED BY** [EXPIRED] amiodarone (NEXTERONE PREMIX) 360-4.14 MG/200ML-% (1.8 mg/mL) IV infusion, 60 mg/hr, Intravenous, Continuous, Last Rate: 33.3 mL/hr at 09/14/19 0408, 60 mg/hr at 09/14/19 0408 **FOLLOWED BY** amiodarone (NEXTERONE PREMIX) 360-4.14 MG/200ML-% (1.8 mg/mL) IV infusion, 30 mg/hr, Intravenous, Continuous, Memon, Jehanzeb, MD, Last Rate: 16.67 mL/hr at 09/15/19 0553, 30 mg/hr at 09/15/19 0553 .  aspirin EC tablet 81 mg, 81 mg, Oral, Daily, Fields, Sandi L, MD, 81 mg at 09/13/19 1417 .  atorvastatin (LIPITOR) tablet 80 mg, 80 mg, Oral, Daily, Oswald Hillock, MD, 80 mg at 09/13/19 1415 .  Chlorhexidine Gluconate Cloth 2 % PADS 6 each, 6 each, Topical, Q0600, Donato Heinz, MD, 6 each at  09/15/19 0550 .  clopidogrel (PLAVIX) tablet 75 mg, 75 mg, Oral, Daily, Memon, Jolaine Artist, MD, 75 mg at 09/13/19 1419 .  doxycycline (VIBRA-TABS) tablet 100 mg, 100 mg, Oral, Q12H, Darrick Meigs, Marge Duncans, MD, 100 mg at 09/14/19 2200 .  insulin aspart (novoLOG) injection 0-9 Units, 0-9 Units, Subcutaneous, Q4H, Oswald Hillock, MD, 1 Units at 09/14/19 2210 .  lidocaine (PF) (XYLOCAINE) 1 % injection 5 mL, 5 mL, Intradermal, PRN, Donato Heinz, MD .  lidocaine-prilocaine (EMLA) cream 1 application, 1 application, Topical, PRN, Donato Heinz, MD .  midodrine (PROAMATINE) tablet 10 mg, 10 mg, Oral, TID WC, Elmarie Shiley, MD, 10 mg at 09/14/19 1552 .  ondansetron (ZOFRAN) tablet 4 mg, 4 mg, Oral, Q6H PRN **OR** ondansetron (ZOFRAN) injection 4 mg, 4 mg, Intravenous, Q6H PRN, Oswald Hillock, MD, 4 mg at 09/15/19 0750 .  pantoprazole (PROTONIX) injection 40 mg, 40 mg, Intravenous, Q12H, Fields, Sandi L, MD, 40 mg at 09/15/19 0938 .  pentafluoroprop-tetrafluoroeth (GEBAUERS) aerosol 1 application, 1 application, Topical, PRN, Donato Heinz, MD .  promethazine (PHENERGAN) injection 6.25 mg, 6.25 mg, Intravenous, Q6H PRN, Kathie Dike, MD, 6.25 mg at 09/14/19 1018 .  sevelamer carbonate (RENVELA) tablet 1,600 mg, 1,600 mg, Oral, TID WC, Darrick Meigs, Marge Duncans, MD, 1,600 mg at 09/13/19 1758 .  sodium chloride 0.9 % bolus 500 mL, 500 mL, Intravenous, Once, Memon, Jehanzeb, MD .  sodium chloride flush (NS) 0.9 % injection 10-40 mL, 10-40 mL, Intracatheter, Q12H, Aviva Signs, MD, 10 mL at 09/15/19 0939 .  sodium chloride flush (NS) 0.9 % injection 10-40 mL,  10-40 mL, Intracatheter, PRN, Aviva Signs, MD .  sodium chloride flush (NS) 0.9 % injection 3 mL, 3 mL, Intravenous, Q12H, Darrick Meigs, Marge Duncans, MD, 3 mL at 09/15/19 0939 .  sodium chloride flush (NS) 0.9 % injection 3 mL, 3 mL, Intravenous, PRN, Darrick Meigs, Marge Duncans, MD  Objective: Blood pressure (!) 114/47, pulse 70, temperature 98.1 F (36.7 C), temperature source Oral,  resp. rate 16, height '5\' 6"'$  (1.676 m), weight 84.6 kg, SpO2 100 %. Patient is alert and in no acute distress. Cardiac exam with irregular rhythm normal S1 and S2.  No murmur gallop noted. Auscultation lungs reveal vesicular breath sounds bilaterally. Abdomen is full bowel sounds are normal.  On palpation soft and nontender. Patient has left AKA.  Labs/studies Results:  CBC Latest Ref Rng & Units 09/15/2019 09/14/2019 09/13/2019  WBC 4.0 - 10.5 K/uL 6.4 6.9 7.3  Hemoglobin 12.0 - 15.0 g/dL 8.9(L) 9.0(L) 8.6(L)  Hematocrit 36.0 - 46.0 % 28.5(L) 29.3(L) 27.0(L)  Platelets 150 - 400 K/uL 176 212 205    CMP Latest Ref Rng & Units 09/15/2019 09/14/2019 09/13/2019  Glucose 70 - 99 mg/dL 80 155(H) 98  BUN 8 - 23 mg/dL 13 28(H) 20  Creatinine 0.44 - 1.00 mg/dL 3.82(H) 6.72(H) 5.15(H)  Sodium 135 - 145 mmol/L 134(L) 134(L) 135  Potassium 3.5 - 5.1 mmol/L 3.2(L) 4.4 3.7  Chloride 98 - 111 mmol/L 95(L) 94(L) 95(L)  CO2 22 - 32 mmol/L '26 23 23  '$ Calcium 8.9 - 10.3 mg/dL 8.6(L) 9.0 8.9  Total Protein 6.5 - 8.1 g/dL - - 7.3  Total Bilirubin 0.3 - 1.2 mg/dL - - 0.9  Alkaline Phos 38 - 126 U/L - - 47  AST 15 - 41 U/L - - 19  ALT 0 - 44 U/L - - 8    Hepatic Function Latest Ref Rng & Units 09/13/2019 09/12/2019 08/31/2019  Total Protein 6.5 - 8.1 g/dL 7.3 8.2(H) -  Albumin 3.5 - 5.0 g/dL 3.0(L) 2.9(L) 2.4(L)  AST 15 - 41 U/L 19 24 -  ALT 0 - 44 U/L 8 9 -  Alk Phosphatase 38 - 126 U/L 47 51 -  Total Bilirubin 0.3 - 1.2 mg/dL 0.9 0.5 -      Assessment:  #1.    GI bleed/melena.    No evidence of active GI bleed.  Blood pressure was low therefore esophagogastroduodenoscopy was postponed.  Patient now refuses to undergo EGD. Patient at increased risk for peptic ulcer disease as well as GERD given that she is on dual antiplatelet therapy and she also has been on apixaban which is now on hold.  #2.  Anemia.  Hemoglobin has remained stable over the last 24 hours.  Since admission it has dropped by 1 g  though.  #3.  End-stage renal disease.    Patient was dialyzed yesterday for 3-1/2 hours requiring 2 boluses of normal saline for SBP of less than 100. Serum potassium low at 3.2.  #4.  Atrial fibrillation.  Eliquis is on hold because of GI bleed.  Patient remains on low-dose aspirin and clopidogrel.  #5.  Status post left AKA on 08/25/2019 for left foot osteomyelitis with gangrene. Patient had drug-eluting stent placed to the right superficial femoral-popliteal artery on 08/22/2019   Recommendations  Try to obtain records of prior EGD and colonoscopy from [facility]. Begin renal/modified carb diet unless patient changes her mind and decides to proceed with esophagogastroduodenoscopy.

## 2019-09-15 NOTE — Op Note (Signed)
[facility] Patient Name: Alexis [name] Procedure Date: 09/15/2019 11:15 AM MRN: [id number] Date of Birth: [birth year] Attending MD: Hildred Laser , MD CSN: [id number] Age: 74 Admit Type: Inpatient Procedure:                Upper GI endoscopy Indications:              Melena Providers:                Hildred Laser, MD, Lurline Del, RN, Bonnetta Barry,                            Technician Referring MD:             Kathie Dike, MD Medicines:                Lidocaine spray, Fentanyl 25 micrograms IV,                            Midazolam 2 mg IV Complications:            No immediate complications. Estimated Blood Loss:     Estimated blood loss: none. Procedure:                Pre-Anesthesia Assessment:                           - Prior to the procedure, a History and Physical                            was performed, and patient medications and                            allergies were reviewed. The patient's tolerance of                            previous anesthesia was also reviewed. The risks                            and benefits of the procedure and the sedation                            options and risks were discussed with the patient.                            All questions were answered, and informed consent                            was obtained. Prior Anticoagulants: The patient                            last took Eliquis (apixaban) 2 days and Plavix                            (clopidogrel) 2 days prior to the procedure. ASA  Grade Assessment: III - A patient with severe                            systemic disease. After reviewing the risks and                            benefits, the patient was deemed in satisfactory                            condition to undergo the procedure.                           After obtaining informed consent, the endoscope was                            passed under direct vision. Throughout the              procedure, the patient's blood pressure, pulse, and                            oxygen saturations were monitored continuously. The                            GIF-H190 ([id number]) scope was introduced through the                            mouth, and advanced to the second part of duodenum.                            The upper GI endoscopy was accomplished without                            difficulty. The patient tolerated the procedure                            well. Scope In: 12:14:01 PM Scope Out: 12:23:19 PM Total Procedure Duration: 0 hours 9 minutes 18 seconds  Findings:      The hypopharynx was normal.      The proximal esophagus and mid esophagus were normal.      Coating in the distal esophagus, 37 to 39 cm from the incisors. Cells       for cytology were obtained by brushing.      The Z-line was irregular and was found 39 cm from the incisors.      A single small semi-sessile polyp with no bleeding and no stigmata of       recent bleeding was found in the cardia.      Patchy mildly erythematous mucosa without bleeding was found in the       gastric antrum.      The duodenal bulb was normal.      A single small angiodysplastic lesion without bleeding was found in the       second portion of the duodenum. Impression:               - Normal hypopharynx.                           -  Normal proximal esophagus and mid esophagus.                           - Mucosal coating at distal esophagus. ? candida                            esophagits. ? dissolved pill. Cells for cytology                            obtained.                           - Z-line irregular, 39 cm from the incisors.                           - A single gastric polyp.                           - Erythematous mucosa in the antrum.                           - Normal duodenal bulb.                           - A single non-bleeding angiodysplastic lesion in                            the duodenum. Moderate  Sedation:      Moderate (conscious) sedation was administered by the endoscopy nurse       and supervised by the endoscopist. The following parameters were       monitored: oxygen saturation, heart rate, blood pressure, CO2       capnography and response to care. Total physician intraservice time was       13 minutes. Recommendation:           - Return patient to ICU for ongoing care.                           - Diabetic (ADA)renal diet today.                           - Continue present medications.                           - Metoclopromide 5 mg IV q 6 hours for 4 doses.                           - Continue Aspirin and Clopidogrel as recommended                            by Dr. Oneida Alar.                           - Await results of KOH.                           - Perform  a colonoscopy at appointment to be                            scheduled. Procedure Code(s):        --- Professional ---                           [id number], Esophagogastroduodenoscopy, flexible,                            transoral; diagnostic, including collection of                            specimen(s) by brushing or washing, when performed                            (separate procedure)                           G0500, Moderate sedation services provided by the                            same physician or other qualified health care                            professional performing a gastrointestinal                            endoscopic service that sedation supports,                            requiring the presence of an independent trained                            observer to assist in the monitoring of the                            patient's level of consciousness and physiological                            status; initial 15 minutes of intra-service time;                            patient age 20 years or older (additional time may                            be reported with [phone], as appropriate) Diagnosis  Code(s):        --- Professional ---                           K22.8, Other specified diseases of esophagus                           K31.7, Polyp of stomach and duodenum  K31.89, Other diseases of stomach and duodenum                           K31.819, Angiodysplasia of stomach and duodenum                            without bleeding                           K92.1, Melena (includes Hematochezia) CPT copyright 2019 American Medical Association. All rights reserved. The codes documented in this report are preliminary and upon coder review may  be revised to meet current compliance requirements. Hildred Laser, MD Hildred Laser, MD 09/15/2019 12:45:06 PM This report has been signed electronically. Number of Addenda: 0

## 2019-09-15 NOTE — Progress Notes (Signed)
Patient has decided to proceed with esophagogastroduodenoscopy. Patient is not tachycardic or hypotensive. Therefore we will proceed with procedure with conscious sedation.

## 2019-09-15 NOTE — Progress Notes (Signed)
Patient's vital signs remained stable throughout the Endoscopic procedure. Patient's vital signs monitored to and from ICU to Endoscopy Room 1.

## 2019-09-16 DIAGNOSIS — Z7189 Other specified counseling: Secondary | ICD-10-CM

## 2019-09-16 DIAGNOSIS — E162 Hypoglycemia, unspecified: Secondary | ICD-10-CM

## 2019-09-16 DIAGNOSIS — F411 Generalized anxiety disorder: Secondary | ICD-10-CM

## 2019-09-16 DIAGNOSIS — Z515 Encounter for palliative care: Secondary | ICD-10-CM

## 2019-09-16 DIAGNOSIS — I4891 Unspecified atrial fibrillation: Secondary | ICD-10-CM | POA: Diagnosis not present

## 2019-09-16 DIAGNOSIS — N186 End stage renal disease: Secondary | ICD-10-CM | POA: Diagnosis not present

## 2019-09-16 DIAGNOSIS — K921 Melena: Secondary | ICD-10-CM | POA: Diagnosis not present

## 2019-09-16 LAB — RENAL FUNCTION PANEL
Albumin: 2.8 g/dL — ABNORMAL LOW (ref 3.5–5.0)
Anion gap: 13 (ref 5–15)
BUN: 21 mg/dL (ref 8–23)
CO2: 27 mmol/L (ref 22–32)
Calcium: 8.8 mg/dL — ABNORMAL LOW (ref 8.9–10.3)
Chloride: 95 mmol/L — ABNORMAL LOW (ref 98–111)
Creatinine, Ser: 5.88 mg/dL — ABNORMAL HIGH (ref 0.44–1.00)
GFR calc Af Amer: 8 mL/min — ABNORMAL LOW (ref >60)
GFR calc non Af Amer: 7 mL/min — ABNORMAL LOW (ref >60)
Glucose, Bld: 117 mg/dL — ABNORMAL HIGH (ref 70–99)
Phosphorus: 4 mg/dL (ref 2.5–4.6)
Potassium: 3.3 mmol/L — ABNORMAL LOW (ref 3.5–5.1)
Sodium: 135 mmol/L (ref 135–145)

## 2019-09-16 LAB — GLUCOSE, CAPILLARY
Glucose-Capillary: 87 mg/dL (ref 70–99)
Glucose-Capillary: 122 mg/dL — ABNORMAL HIGH (ref 70–99)
Glucose-Capillary: 112 mg/dL — ABNORMAL HIGH (ref 70–99)
Glucose-Capillary: 129 mg/dL — ABNORMAL HIGH (ref 70–99)
Glucose-Capillary: 110 mg/dL — ABNORMAL HIGH (ref 70–99)
Glucose-Capillary: 114 mg/dL — ABNORMAL HIGH (ref 70–99)

## 2019-09-16 LAB — CBC
HCT: 26.8 % — ABNORMAL LOW (ref 36.0–46.0)
Hemoglobin: 8.5 g/dL — ABNORMAL LOW (ref 12.0–15.0)
MCH: 25.9 pg — ABNORMAL LOW (ref 26.0–34.0)
MCHC: 31.7 g/dL (ref 30.0–36.0)
MCV: 81.7 fL (ref 80.0–100.0)
Platelets: 185 10*3/uL (ref 150–400)
RBC: 3.28 MIL/uL — ABNORMAL LOW (ref 3.87–5.11)
RDW: 19 % — ABNORMAL HIGH (ref 11.5–15.5)
WBC: 7.2 10*3/uL (ref 4.0–10.5)
nRBC: 0 % (ref 0.0–0.2)

## 2019-09-16 MED ORDER — ALPRAZOLAM 0.25 MG PO TABS
0.2500 mg | ORAL_TABLET | Freq: Every evening | ORAL | Status: DC | PRN
  Administered 2019-09-21: 0.25 mg via ORAL
  Filled 2019-09-16: qty 1

## 2019-09-16 MED ORDER — SODIUM CHLORIDE 0.9 % IV SOLN: INTRAVENOUS | Status: DC

## 2019-09-16 MED ORDER — PANTOPRAZOLE SODIUM 40 MG PO TBEC
40.0000 mg | DELAYED_RELEASE_TABLET | Freq: Two times a day (BID) | ORAL | Status: DC
  Administered 2019-09-16 – 2019-09-19 (×4): 40 mg via ORAL
  Filled 2019-09-16 (×5): qty 1

## 2019-09-16 NOTE — Progress Notes (Signed)
Dr. Darrick Meigs notified of B/P 91/45 MAP 59, HR 72, R 18.  Patient is sleeping without any distress noted.

## 2019-09-16 NOTE — Progress Notes (Signed)
PROGRESS NOTE    Alexis [name]  ZOX:[id number] DOB: 1945/10/18 DOA: 09/12/2019 PCP: System, Provider Not In    Brief Narrative:  74 year old female with a history of atrial fibrillation on anticoagulation, end-stage renal disease, coronary artery disease, peripheral vascular disease, diabetes, was recently discharged from the hospital on 4/17 after she underwent left above-the-knee amputation and was started on dual antiplatelet therapy for drug-eluting stent placed in her right lower extremity.  She was admitted to the hospital with severe hypoglycemia.  She was cool and clammy and hemodialysis.  Further work-up showed that she was hypotensive and guaiac positive.  She was admitted for further work-up of GI bleeding.   Assessment & Plan:   Active Problems:   Anemia in chronic kidney disease (CKD)   Type II diabetes mellitus with manifestations ([place])   ESRD on dialysis [facility])   Atrial fibrillation with rapid ventricular response (HCC)   Osteomyelitis (HCC)   GI bleed   Melena   Hx of AKA (above knee amputation), left (HCC)   Pressure injury of skin   Hypotension   Hypoglycemia   1. GI bleed.  Patient had guaiac positive stools.  She is on chronic Eliquis and dual antiplatelet therapy with aspirin and Plavix.  Eliquis currently on hold.    She was seen by gastroenterology and underwent EGD on 5/2 that did not show any signs of active bleeding.  Will defer need for colonoscopy and timing to restart eliquis to gastroenterology. 2. Anemia of chronic kidney disease.  Hemoglobin is currently stable.  She is not had a significant decline due to GI bleeding.  Continue to monitor. 3. Altered mental status.  Secondary to hypoglycemia.  She received D50.  Overall blood sugars have improved.  Mental status is also improved. 4. Atrial fibrillation without ventricular response.  Chronically on amiodarone.  Eliquis on hold due to GI bleeding.  She was treated with amiodarone infusion.  Heart rates are  now improved and she is back on oral amiodarone. 5. End-stage renal disease on hemodialysis, Tuesday, Thursday, Saturday.  Nephrology following. 6. Hypotension.  Patient has chronic hypotension and is on midodrine.  Midodrine dose was increased to 10 mg 3 times daily.  Overall blood pressures are improved.. 7. Right lower extremity chronic osteomyelitis.  She is on chronic doxycycline with a stop date of 09/18/2019.  Follow-up with orthopedics. 8. Diabetes.  Started on sliding scale insulin.  Blood sugars have been stable. 9. Peripheral vascular disease.  Recently had drug-eluting stent placed in the right superficial femoral-popliteal artery on 08/22/2019.  She is on dual antiplatelet therapy.   DVT prophylaxis: SCDs Code Status: Full code Family Communication: Updated daughter over the phone 5/3 Disposition Plan: Status is: Inpatient  Remains inpatient appropriate because:Inpatient level of care appropriate due to severity of illness.  Patient may need further GI work-up including colonoscopy pending further GI input.   Dispo: The patient is from: SNF              Anticipated d/c is to: SNF              Anticipated d/c date is: 2 days              Patient currently is not medically stable to d/c.  Consultants:   Gastroenterology  Nephrology  General surgery for central line placement  Palliative care  Procedures:   5/1 right femoral vein central line 5/2 EGD: Normal hypopharynx.                           -  Normal proximal esophagus and mid esophagus.                           - Mucosal coating at distal esophagus. ? candida                            esophagits. ? dissolved pill. Cells for cytology                            obtained.                           - Z-line irregular, 39 cm from the incisors.                           - A single gastric polyp.                           - Erythematous mucosa in the antrum.                           - Normal duodenal bulb.                            - A single non-bleeding angiodysplastic lesion in                             the duodenum  Antimicrobials:       Subjective: No nausea or vomiting.  Tolerating solid diet.  Denies any shortness of breath.  No further melena.  Objective: Vitals:   09/16/19 1100 09/16/19 1118 09/16/19 1200 09/16/19 1300  BP: (!) 108/93     Pulse: 87 81 82 72  Resp:   (!) 26 19  Temp:  98.7 F (37.1 C)    TempSrc:  Oral    SpO2: 100% 99% 100% 97%  Weight:      Height:        Intake/Output Summary (Last 24 hours) at 09/16/2019 1325 Last data filed at 09/15/2019 2129 Gross per 24 hour  Intake 756.69 ml  Output 2 ml  Net 754.69 ml   Filed Weights   09/14/19 0417 09/14/19 2245 09/16/19 0400  Weight: 84.9 kg 84.6 kg 87.8 kg    Examination:  General exam: Alert, awake, oriented x 3 Respiratory system: Clear to auscultation. Respiratory effort normal. Cardiovascular system: Irregular. No murmurs, rubs, gallops. Gastrointestinal system: Abdomen is nondistended, soft and nontender. No organomegaly or masses felt. Normal bowel sounds heard. Central nervous system: Alert and oriented. No focal neurological deficits. Extremities: Left above-the-knee amputation Skin: No rashes, lesions or ulcers Psychiatry: Judgement and insight appear normal. Mood & affect appropriate.    Data Reviewed: I have personally reviewed following labs and imaging studies  CBC: Recent Labs  Lab 09/12/19 2026 09/12/19 2026 09/13/19 0626 09/13/19 1350 09/14/19 0454 09/15/19 0347 09/16/19 0405  WBC 6.5   < > 7.5 7.3 6.9 6.4 7.2  NEUTROABS 5.2  --   --   --   --   --   --   HGB 9.9*   < > 9.0* 8.6* 9.0* 8.9* 8.5*  HCT 31.0*   < > 28.3*  27.0* 29.3* 28.5* 26.8*  MCV 81.4   < > 82.3 82.1 83.2 81.7 81.7  PLT 238   < > 215 205 212 176 185   < > = values in this interval not displayed.   Basic Metabolic Panel: Recent Labs  Lab 09/12/19 2026 09/13/19 0626 09/14/19 0454 09/15/19 0347  09/16/19 0405  NA 130* 135 134* 134* 135  K 3.8 3.7 4.4 3.2* 3.3*  CL 92* 95* 94* 95* 95*  CO2 25 23 23 26 27   GLUCOSE 79 98 155* 80 117*  BUN 17 20 28* 13 21  CREATININE 4.49* 5.15* 6.72* 3.82* 5.88*  CALCIUM 8.3* 8.9 9.0 8.6* 8.8*  PHOS  --   --   --   --  4.0   GFR: Estimated Creatinine Clearance: 9.5 mL/min (A) (by C-G formula based on SCr of 5.88 mg/dL (H)). Liver Function Tests: Recent Labs  Lab 09/12/19 2026 09/13/19 0626 09/16/19 0405  AST 24 19  --   ALT 9 8  --   ALKPHOS 51 47  --   BILITOT 0.5 0.9  --   PROT 8.2* 7.3  --   ALBUMIN 2.9* 3.0* 2.8*   No results for input(s): LIPASE, AMYLASE in the last 168 hours. No results for input(s): AMMONIA in the last 168 hours. Coagulation Profile: No results for input(s): INR, PROTIME in the last 168 hours. Cardiac Enzymes: No results for input(s): CKTOTAL, CKMB, CKMBINDEX, TROPONINI in the last 168 hours. BNP (last 3 results) No results for input(s): PROBNP in the last 8760 hours. HbA1C: No results for input(s): HGBA1C in the last 72 hours. CBG: Recent Labs  Lab 09/15/19 1236 09/15/19 1619 09/15/19 2111 09/16/19 0739 09/16/19 1116  GLUCAP 87 127* 122* 112* 129*   Lipid Profile: No results for input(s): CHOL, HDL, LDLCALC, TRIG, CHOLHDL, LDLDIRECT in the last 72 hours. Thyroid Function Tests: No results for input(s): TSH, T4TOTAL, FREET4, T3FREE, THYROIDAB in the last 72 hours. Anemia Panel: No results for input(s): VITAMINB12, FOLATE, FERRITIN, TIBC, IRON, RETICCTPCT in the last 72 hours. Sepsis Labs: No results for input(s): PROCALCITON, LATICACIDVEN in the last 168 hours.  Recent Results (from the past 240 hour(s))  Respiratory Panel by RT PCR (Flu A&B, Covid) - Nasopharyngeal Swab     Status: None   Collection Time: 09/12/19 10:19 PM   Specimen: Nasopharyngeal Swab  Result Value Ref Range Status   SARS Coronavirus 2 by RT PCR NEGATIVE NEGATIVE Final    Comment: (NOTE) SARS-CoV-2 target nucleic acids  are NOT DETECTED. The SARS-CoV-2 RNA is generally detectable in upper respiratoy specimens during the acute phase of infection. The lowest concentration of SARS-CoV-2 viral copies this assay can detect is 131 copies/mL. A negative result does not preclude SARS-Cov-2 infection and should not be used as the sole basis for treatment or other patient management decisions. A negative result may occur with  improper specimen collection/handling, submission of specimen other than nasopharyngeal swab, presence of viral mutation(s) within the areas targeted by this assay, and inadequate number of viral copies (<131 copies/mL). A negative result must be combined with clinical observations, patient history, and epidemiological information. The expected result is Negative. Fact Sheet for Patients:  PinkCheek.be Fact Sheet for Healthcare Providers:  GravelBags.it This test is not yet ap proved or cleared by the [place] FDA and  has been authorized for detection and/or diagnosis of SARS-CoV-2 by FDA under an Emergency Use Authorization (EUA). This EUA will remain  in effect (meaning this test can  be used) for the duration of the COVID-19 declaration under Section 564(b)(1) of the Act, 21 U.S.C. section 360bbb-3(b)(1), unless the authorization is terminated or revoked sooner.    Influenza A by PCR NEGATIVE NEGATIVE Final   Influenza B by PCR NEGATIVE NEGATIVE Final    Comment: (NOTE) The Xpert Xpress SARS-CoV-2/FLU/RSV assay is intended as an aid in  the diagnosis of influenza from Nasopharyngeal swab specimens and  should not be used as a sole basis for treatment. Nasal washings and  aspirates are unacceptable for Xpert Xpress SARS-CoV-2/FLU/RSV  testing. Fact Sheet for Patients: PinkCheek.be Fact Sheet for Healthcare Providers: GravelBags.it This test is not yet approved or  cleared by the [place] FDA and  has been authorized for detection and/or diagnosis of SARS-CoV-2 by  FDA under an Emergency Use Authorization (EUA). This EUA will remain  in effect (meaning this test can be used) for the duration of the  Covid-19 declaration under Section 564(b)(1) of the Act, 21  U.S.C. section 360bbb-3(b)(1), unless the authorization is  terminated or revoked. Performed at [facility], [address] [place], [place] [address]   MRSA PCR Screening     Status: None   Collection Time: 09/14/19  3:36 AM   Specimen: Nasal Mucosa; Nasopharyngeal  Result Value Ref Range Status   MRSA by PCR NEGATIVE NEGATIVE Final    Comment:        The GeneXpert MRSA Assay (FDA approved for NASAL specimens only), is one component of a comprehensive MRSA colonization surveillance program. It is not intended to diagnose MRSA infection nor to guide or monitor treatment for MRSA infections. Performed at [facility], [address] [place], [place] [address]   [place] prep     Status: None   Collection Time: 09/15/19 12:37 PM   Specimen: PATH GI Other  Result Value Ref Range Status   Specimen Description ESOPHAGUS  Final   Special Requests NONE  Final   KOH Prep   Final    NO YEAST OR FUNGAL ELEMENTS SEEN Performed at [facility], [address], [place], [place] [address]    Report Status 09/15/2019 FINAL  Final         Radiology Studies: No results found.      Scheduled Meds: . amiodarone  200 mg Oral Daily  . aspirin EC  81 mg Oral Daily  . atorvastatin  80 mg Oral Daily  . Chlorhexidine Gluconate Cloth  6 each Topical Q0600  . clopidogrel  75 mg Oral Daily  . doxycycline  100 mg Oral Q12H  . insulin aspart  0-9 Units Subcutaneous TID WC  . midodrine  10 mg Oral TID WC  . pantoprazole  40 mg Oral BID AC  . sevelamer carbonate  1,600 mg Oral TID WC  . sodium chloride flush  10-40 mL Intracatheter Q12H  . sodium chloride flush  3 mL Intravenous Q12H    Continuous Infusions: . sodium chloride 250 mL (09/14/19 2100)  . sodium chloride    . sodium chloride    . sodium chloride       LOS: 2 days    Time spent: 2mins    Kathie Dike, MD [facility]   If 7PM-7AM, please contact night-coverage [email or web address]  09/16/2019, 1:25 PM

## 2019-09-16 NOTE — Progress Notes (Signed)
Called by primary RN to change central line dressing due to dirty dressing. Drainage noted to site, dressing compromised and edges loose. Dressing changed using sterile technique and site cleansed with chloroprep thoroughly and cleanser allowed to fully dry prior to placing new, sterile dressing. All caps changed per policy. All lines with noted blood return and flushed with 10 ccs sterile saline without difficulty.

## 2019-09-16 NOTE — Progress Notes (Addendum)
Subjective: Feels better today. Denies abdominal pain, N/V. Denies any further melena. HR doing better, she's glad that has improved. Keeping diet down well. No GI complaints this morning.  Objective: Vital signs in last 24 hours: Temp:  [98.3 F (36.8 C)-99.2 F (37.3 C)] 98.9 F (37.2 C) (05/03 0740) Pulse Rate:  [66-81] 76 (05/03 0740) Resp:  [15-26] 21 (05/03 0740) BP: (87-115)/(14-100) 100/47 (05/03 0700) SpO2:  [93 %-100 %] 95 % (05/03 0740) Weight:  [87.8 kg] 87.8 kg (05/03 0400) Last BM Date: 09/15/19 General:   Alert and oriented, pleasant Head:  Normocephalic and atraumatic. Eyes:  No icterus, sclera clear. Conjuctiva pink.  Heart:  S1, S2 present, no murmurs noted.  Lungs: Clear to auscultation bilaterally, without wheezing, rales, or rhonchi.  Abdomen:  Bowel sounds present, soft, non-tender, non-distended. No HSM or hernias noted. No rebound or guarding. No masses appreciated  Msk:  Symmetrical without gross deformities. Pulses:  Normal pulses noted. Extremities:  Without clubbing or edema. Noted left AKA, mild bloody drainage noted. Neurologic:  Alert and  oriented x4;  grossly normal neurologically. Skin:  Warm and dry, intact without significant lesions.  Psych:  Alert and cooperative. Normal mood and affect.  Intake/Output from previous day: 05/02 0701 - 05/03 0700 In: 756.7 [P.O.:240; I.V.:516.7] Out: 2 [Stool:2] Intake/Output this shift: No intake/output data recorded.  Lab Results: Recent Labs    09/14/19 0454 09/15/19 0347 09/16/19 0405  WBC 6.9 6.4 7.2  HGB 9.0* 8.9* 8.5*  HCT 29.3* 28.5* 26.8*  PLT 212 176 185   BMET Recent Labs    09/14/19 0454 09/15/19 0347 09/16/19 0405  NA 134* 134* 135  K 4.4 3.2* 3.3*  CL 94* 95* 95*  CO2 23 26 27   GLUCOSE 155* 80 117*  BUN 28* 13 21  CREATININE 6.72* 3.82* 5.88*  CALCIUM 9.0 8.6* 8.8*   LFT Recent Labs    09/16/19 0405  ALBUMIN 2.8*   PT/INR No results for input(s): LABPROT, INR  in the last 72 hours. Hepatitis Panel No results for input(s): HEPBSAG, HCVAB, HEPAIGM, HEPBIGM in the last 72 hours.   Studies/Results: No results found.  Assessment: Pleasant 74 year old female presented for severe hyperglycemia and noted to be heme positive stool with black stools. No previous GI history in our system but states EGD/Colonoscopy 2 years ago at [facility]. No history of colonoscopy or endoscopy in our system. She responded well to D50 but was admitted for likely GI bleed and anemia. She is on Eliquis chronic anticoagulation for atrial fibrillation and is on DAPT with aspirin and Plavix due to recent leg stenting by vascular surgery. She has also recent AKA with hospital discharge 08/31/2019.  GI bleed/anemia- Baseline hemoglobin appears to be around 9.9 in the setting of multiple chronic conditions and ESRD. initial drop of 1 gm on admission but since stable. Occasional NSAID use as well as ASA for DAPT. EGD over the weekend with ? Candida esophagitis vs dissolved pill distal esophagus (KOH brushing negative), single gastric polyp, erythematous antrum, single non-bleeding angiodysplastic lesion in duodenum; overall no active bleeding noted. Hgb slight drift but overall stable today at 8.5.  AFib/RVR- Noted chronic AFib and went into RVR on Friday (HR 140s-160s) and was transferred to the ICU for IV Amiodarone and has since had improvement in tachycardia though blood pressures remain soft (last BP 100/47, HR 76). Eliquis on hold due to anemia/GI bleed; remains on ASA/Plavix.  Outpatient [facility] records on the fax this morning in the  ICU: Completed 2016, notmal colonoscopy with recommended repeat exam in 10 years (2026).   Plan: 1. Continue to hold Eliquis for now; will discuss with Dr. Oneida Alar risks vs. benefits 2. Continue Plavix and ASA 3. Monitor for any recurrent GI bleed/melena 4. Follow hgb 5. Supportive measures   Thank you for allowing [place] to participate in the care of [place], DNP, AGNP-C Adult & Gerontological Nurse Practitioner [facility]     LOS: 2 days    09/16/2019, 7:47 AM
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
## 2019-09-16 NOTE — Progress Notes (Signed)
Patient states she "is tired of taking pills". Refused her medications. Educated on importance of all medications. Patient did not verbalize understanding. But did agree to take her midodrine and protonix. Did not take renvelas.

## 2019-09-16 NOTE — Progress Notes (Signed)
Admit: 09/12/2019 LOS: 2  78F ESRD THS [facility] with AMS, melelena on DAPT+DOAC  Subjective:  . EGD yesterday, neg for acute bleeding source . Eating breakfast, famiy in room . Hb 8.5 from 8.9 . K 3.3 . Last HD 5/1  05/02 0701 - 05/03 0700 In: 756.7 [P.O.:240; I.V.:516.7] Out: 2 [Stool:2]  Filed Weights   09/14/19 0417 09/14/19 2245 09/16/19 0400  Weight: 84.9 kg 84.6 kg 87.8 kg    Scheduled Meds: . amiodarone  200 mg Oral Daily  . aspirin EC  81 mg Oral Daily  . atorvastatin  80 mg Oral Daily  . Chlorhexidine Gluconate Cloth  6 each Topical Q0600  . clopidogrel  75 mg Oral Daily  . doxycycline  100 mg Oral Q12H  . insulin aspart  0-9 Units Subcutaneous TID WC  . metoCLOPramide (REGLAN) injection  5 mg Intravenous Q6H  . midodrine  10 mg Oral TID WC  . pantoprazole (PROTONIX) IV  40 mg Intravenous Q12H  . sevelamer carbonate  1,600 mg Oral TID WC  . sodium chloride flush  10-40 mL Intracatheter Q12H  . sodium chloride flush  3 mL Intravenous Q12H   Continuous Infusions: . sodium chloride 250 mL (09/14/19 2100)  . sodium chloride    . sodium chloride    . sodium chloride     PRN Meds:.sodium chloride, sodium chloride, sodium chloride, acetaminophen **OR** acetaminophen, albuterol, lidocaine (PF), lidocaine-prilocaine, ondansetron **OR** ondansetron (ZOFRAN) IV, pentafluoroprop-tetrafluoroeth, promethazine, sodium chloride flush, sodium chloride flush  Current Labs: reviewed    Physical Exam:  Blood pressure (!) 110/56, pulse 79, temperature 98.9 F (37.2 C), temperature source Axillary, resp. rate 19, height 5\' 6"  (1.676 m), weight 87.8 kg, SpO2 99 %. NAD, awake alert sitting up in bed RRR CTAB No sig LEE< feet bandaged AVG RFA +B/T RUE  A 1. 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 Melena, EGD 5/2, neg for acute blood loss; ? CSY 3. Anemia: Hb 8s, CTM 4. CKD-BMD: sevelamer, P ok 5. 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 AMS, improved 7. Hypotension: on chornic  midodrine  P . HD tomorrow on schedule AVG, 3.5h, no heparin, 4K, 1-2L UF SBP > 95 . Medication Issues; o Preferred narcotic agents for pain control are hydromorphone, fentanyl, and methadone. Morphine should not be used.  o Baclofen should be avoided o Avoid oral sodium phosphate and magnesium citrate based laxatives / bowel preps    Pearson Grippe MD 09/16/2019, 10:52 AM  Recent Labs  Lab 09/14/19 0454 09/15/19 0347 09/16/19 0405  NA 134* 134* 135  K 4.4 3.2* 3.3*  CL 94* 95* 95*  CO2 23 26 27   GLUCOSE 155* 80 117*  BUN 28* 13 21  CREATININE 6.72* 3.82* 5.88*  CALCIUM 9.0 8.6* 8.8*  PHOS  --   --  4.0   Recent Labs  Lab 09/12/19 2026 09/13/19 0626 09/14/19 0454 09/15/19 0347 09/16/19 0405  WBC 6.5   < > 6.9 6.4 7.2  NEUTROABS 5.2  --   --   --   --   HGB 9.9*   < > 9.0* 8.9* 8.5*  HCT 31.0*   < > 29.3* 28.5* 26.8*  MCV 81.4   < > 83.2 81.7 81.7  PLT 238   < > 212 176 185   < > = values in this interval not displayed.

## 2019-09-17 ENCOUNTER — Encounter (HOSPITAL_COMMUNITY)
Admission: EM | Disposition: A | Discharge status: Skilled Nursing Facility | Payer: Self-pay | Attending: Family Medicine

## 2019-09-17 ENCOUNTER — Ambulatory Visit: Payer: Medicare Other | Admitting: Family

## 2019-09-17 DIAGNOSIS — D631 Anemia in chronic kidney disease: Secondary | ICD-10-CM | POA: Diagnosis not present

## 2019-09-17 DIAGNOSIS — N186 End stage renal disease: Secondary | ICD-10-CM | POA: Diagnosis not present

## 2019-09-17 DIAGNOSIS — K921 Melena: Secondary | ICD-10-CM

## 2019-09-17 DIAGNOSIS — Z992 Dependence on renal dialysis: Secondary | ICD-10-CM

## 2019-09-17 HISTORY — PX: AGILE CAPSULE: SHX5420

## 2019-09-17 LAB — CBC
HCT: 27.4 % — ABNORMAL LOW (ref 36.0–46.0)
Hemoglobin: 8.7 g/dL — ABNORMAL LOW (ref 12.0–15.0)
MCH: 26 pg (ref 26.0–34.0)
MCHC: 31.8 g/dL (ref 30.0–36.0)
MCV: 82 fL (ref 80.0–100.0)
Platelets: 168 10*3/uL (ref 150–400)
RBC: 3.34 MIL/uL — ABNORMAL LOW (ref 3.87–5.11)
RDW: 19.7 % — ABNORMAL HIGH (ref 11.5–15.5)
WBC: 7 10*3/uL (ref 4.0–10.5)
nRBC: 0 % (ref 0.0–0.2)

## 2019-09-17 LAB — GLUCOSE, CAPILLARY
Glucose-Capillary: 101 mg/dL — ABNORMAL HIGH (ref 70–99)
Glucose-Capillary: 103 mg/dL — ABNORMAL HIGH (ref 70–99)
Glucose-Capillary: 134 mg/dL — ABNORMAL HIGH (ref 70–99)
Glucose-Capillary: 124 mg/dL — ABNORMAL HIGH (ref 70–99)
Glucose-Capillary: 97 mg/dL (ref 70–99)

## 2019-09-17 SURGERY — AGILE CAPSULE

## 2019-09-17 MED ORDER — ALBUMIN HUMAN 25 % IV SOLN
25.0000 g | Freq: Two times a day (BID) | INTRAVENOUS | Status: DC | PRN
  Administered 2019-09-17 – 2019-09-21 (×4): 25 g via INTRAVENOUS

## 2019-09-17 NOTE — Progress Notes (Signed)
Admit: 09/12/2019 LOS: 3  30F ESRD THS [facility] with AMS, melelena on DAPT+DOAC  Subjective:  . No interval events . Palliative working with family  05/03 0701 - 05/04 0700 In: 22 [P.O.:240; I.V.:74] Out: -   Filed Weights   09/14/19 0417 09/14/19 2245 09/16/19 0400  Weight: 84.9 kg 84.6 kg 87.8 kg    Scheduled Meds: . amiodarone  200 mg Oral Daily  . aspirin EC  81 mg Oral Daily  . atorvastatin  80 mg Oral Daily  . Chlorhexidine Gluconate Cloth  6 each Topical Q0600  . clopidogrel  75 mg Oral Daily  . doxycycline  100 mg Oral Q12H  . insulin aspart  0-9 Units Subcutaneous TID WC  . midodrine  10 mg Oral TID WC  . pantoprazole  40 mg Oral BID AC  . sevelamer carbonate  1,600 mg Oral TID WC  . sodium chloride flush  10-40 mL Intracatheter Q12H  . sodium chloride flush  3 mL Intravenous Q12H   Continuous Infusions: . sodium chloride 5 mL/hr at 09/14/19 2105  . sodium chloride    . sodium chloride    . sodium chloride     PRN Meds:.sodium chloride, sodium chloride, sodium chloride, acetaminophen **OR** acetaminophen, albuterol, ALPRAZolam, lidocaine (PF), lidocaine-prilocaine, ondansetron **OR** ondansetron (ZOFRAN) IV, pentafluoroprop-tetrafluoroeth, promethazine, sodium chloride flush, sodium chloride flush  Current Labs: reviewed    Physical Exam:  Blood pressure (!) 93/38, pulse 79, temperature 97.9 F (36.6 C), resp. rate 18, height 5\' 6"  (1.676 m), weight 87.8 kg, SpO2 94 %. NAD, awake alert sitting up in bed RRR CTAB No sig LEE< feet bandaged AVG RFA +B/T RUE  A 1. 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 Melena, EGD 5/2, neg for acute blood loss; per GI 3. Anemia: Hb 8s, CTM 4. CKD-BMD: sevelamer, P ok 5. 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 AMS, improved 7. Hypotension: on chornic midodrine  P . HD todayon schedule AVG, 3.5h, no heparin, 4K, 1-2L UF SBP > 95 . Cont GOC / Palliative discussions . Medication Issues; o Preferred narcotic agents for pain control are  hydromorphone, fentanyl, and methadone. Morphine should not be used.  o Baclofen should be avoided o Avoid oral sodium phosphate and magnesium citrate based laxatives / bowel preps    Pearson Grippe MD 09/17/2019, 12:01 PM  Recent Labs  Lab 09/14/19 0454 09/15/19 0347 09/16/19 0405  NA 134* 134* 135  K 4.4 3.2* 3.3*  CL 94* 95* 95*  CO2 23 26 27   GLUCOSE 155* 80 117*  BUN 28* 13 21  CREATININE 6.72* 3.82* 5.88*  CALCIUM 9.0 8.6* 8.8*  PHOS  --   --  4.0   Recent Labs  Lab 09/12/19 2026 09/13/19 0626 09/14/19 0454 09/15/19 0347 09/16/19 0405  WBC 6.5   < > 6.9 6.4 7.2  NEUTROABS 5.2  --   --   --   --   HGB 9.9*   < > 9.0* 8.9* 8.5*  HCT 31.0*   < > 29.3* 28.5* 26.8*  MCV 81.4   < > 83.2 81.7 81.7  PLT 238   < > 212 176 185   < > = values in this interval not displayed.

## 2019-09-17 NOTE — Progress Notes (Addendum)
PROGRESS NOTE    Alexis [name]  BDZ:[id number] DOB: 11-Jul-1945 DOA: 09/12/2019 PCP: System, Provider Not In    Brief Narrative:  74 year old female with a history of atrial fibrillation on anticoagulation, end-stage renal disease, coronary artery disease, peripheral vascular disease, diabetes, was recently discharged from the hospital on 4/17 after she underwent left above-the-knee amputation and was started on dual antiplatelet therapy for drug-eluting stent placed in her right lower extremity.  She was admitted to the hospital with severe hypoglycemia.  She was cool and clammy and hemodialysis.  Further work-up showed that she was hypotensive and guaiac positive.  She was admitted for further work-up of GI bleeding.   Assessment & Plan:   Active Problems:   Anemia in chronic kidney disease (CKD)   Type II diabetes mellitus with manifestations (HCC)   ESRD on dialysis [facility])   Atrial fibrillation with rapid ventricular response (HCC)   Osteomyelitis (HCC)   UGI bleed   Melena   Hx of AKA (above knee amputation), left (HCC)   Pressure injury of skin   Hypotension   Hypoglycemia   Anxiety state   Palliative care by specialist   Goals of care, counseling/discussion   GI bleed.  Patient had guaiac positive stools.  She is on chronic Eliquis and dual antiplatelet therapy with aspirin and Plavix.  Eliquis currently on hold.    She was seen by gastroenterology and underwent EGD on 5/2 that did not show any signs of active bleeding.  Patient was unable to swallow agile capsule today.  Dr. Gala Romney will attempt to place agile capsule on 5/5 endoscopically. Anemia of chronic kidney disease.  Hemoglobin is currently stable.  She is not had a significant decline due to GI bleeding.  Continue to monitor. Acute Metabolic Encephalopathy.  Secondary to hypoglycemia.  She received D50.  Overall blood sugars have improved.  Mental status is also improved. Atrial fibrillation without ventricular  response.  Chronically on amiodarone.  Eliquis on hold due to GI bleeding.  She was treated with amiodarone infusion.  Heart rates are now improved and she is back on oral amiodarone.  Discussed with cardiology, Dr. Domenic Polite regarding risks and benefits of continuing Eliquis.  Currently, her CHADSVASc score is 7, putting her at a 15.7% risk of stroke/TIA.  She recently had a stroke and 05/2019, making her high risk for recurrence. End-stage renal disease on hemodialysis, Tuesday, Thursday, Saturday.  Nephrology following. Hypotension.  Patient has chronic hypotension and is on midodrine.  Midodrine dose was increased to 10 mg 3 times daily.  Overall blood pressures are improved.. Right lower extremity chronic osteomyelitis.  She is on chronic doxycycline with a stop date of 09/18/2019.  Follow-up with orthopedics. Diabetes.  Started on sliding scale insulin.  Blood sugars have been stable. Peripheral vascular disease.  Recently had drug-eluting stent placed in the right superficial femoral-popliteal artery on 08/22/2019.  She is on dual antiplatelet therapy.  Discussed with Dr. Doren Custard on-call for vascular surgery who agreed with stopping aspirin for now, but requested the Plavix be continued due to recent stent. Discussion.  This is a complicated situation.  Patient will be at risk for bleeding with anticoagulation, although her risk of stroke is also very high.  Palliative care has initiated discussions regarding goals of care, patient/family wish to continue full scope of care at this time.  May need to try the patient back on Eliquis once GI work-up is complete and monitor for any bleeding recurrence.   DVT prophylaxis: SCDs  Code Status: Full code Family Communication: Try to contact daughter (POA), no answer, unable to leave voicemail Disposition Plan: Status is: Inpatient  Remains inpatient appropriate because:Inpatient level of care appropriate due to severity of illness.  Patient may need further GI  work-up including colonoscopy pending further GI input.   Dispo: The patient is from: SNF              Anticipated d/c is to: Home              Anticipated d/c date is: 2 days              Patient currently is not medically stable to d/c.  Consultants:  Gastroenterology Nephrology General surgery for central line placement Palliative care  Procedures:  5/1 right femoral vein central line, removed on 5/4 5/2 EGD: Normal hypopharynx.                           - Normal proximal esophagus and mid esophagus.                           - Mucosal coating at distal esophagus. ? candida                            esophagits. ? dissolved pill. Cells for cytology                            obtained.                           - Z-line irregular, 39 cm from the incisors.                           - A single gastric polyp.                           - Erythematous mucosa in the antrum.                           - Normal duodenal bulb.                           - A single non-bleeding angiodysplastic lesion in                            the duodenum  Antimicrobials:      Subjective: No melena, no nausea or vomiting.  Tolerating diet.  Objective: Vitals:   09/17/19 1700 09/17/19 1715 09/17/19 1730 09/17/19 1745  BP: (!) 96/45 (!) 88/47 (!) 87/43 (!) 84/42  Pulse: 82 84 80 81  Resp:      Temp:      TempSrc:      SpO2:      Weight:      Height:       No intake or output data in the 24 hours ending 09/17/19 1756 Filed Weights   09/14/19 2245 09/16/19 0400 09/17/19 1630  Weight: 84.6 kg 87.8 kg 86.8 kg    Examination:  General exam: Alert, awake, oriented x 3 Respiratory system: Clear to auscultation. Respiratory effort normal. Cardiovascular system: Irregular.  No murmurs, rubs, gallops. Gastrointestinal system: Abdomen is nondistended, soft and nontender. No organomegaly or masses felt. Normal bowel sounds heard. Central nervous system: Alert and oriented. No focal neurological  deficits. Extremities: Left above-the-knee amputation Skin: No rashes, lesions or ulcers Psychiatry: Judgement and insight appear normal. Mood & affect appropriate.    Data Reviewed: I have personally reviewed following labs and imaging studies  CBC: Recent Labs  Lab 09/12/19 2026 09/13/19 0626 09/13/19 1350 09/14/19 0454 09/15/19 0347 09/16/19 0405 09/17/19 1221  WBC 6.5   < > 7.3 6.9 6.4 7.2 7.0  NEUTROABS 5.2  --   --   --   --   --   --   HGB 9.9*   < > 8.6* 9.0* 8.9* 8.5* 8.7*  HCT 31.0*   < > 27.0* 29.3* 28.5* 26.8* 27.4*  MCV 81.4   < > 82.1 83.2 81.7 81.7 82.0  PLT 238   < > 205 212 176 185 168   < > = values in this interval not displayed.   Basic Metabolic Panel: Recent Labs  Lab 09/12/19 2026 09/13/19 0626 09/14/19 0454 09/15/19 0347 09/16/19 0405  NA 130* 135 134* 134* 135  K 3.8 3.7 4.4 3.2* 3.3*  CL 92* 95* 94* 95* 95*  CO2 25 23 23 26 27   GLUCOSE 79 98 155* 80 117*  BUN 17 20 28* 13 21  CREATININE 4.49* 5.15* 6.72* 3.82* 5.88*  CALCIUM 8.3* 8.9 9.0 8.6* 8.8*  PHOS  --   --   --   --  4.0   GFR: Estimated Creatinine Clearance: 9.5 mL/min (A) (by C-G formula based on SCr of 5.88 mg/dL (H)). Liver Function Tests: Recent Labs  Lab 09/12/19 2026 09/13/19 0626 09/16/19 0405  AST 24 19  --   ALT 9 8  --   ALKPHOS 51 47  --   BILITOT 0.5 0.9  --   PROT 8.2* 7.3  --   ALBUMIN 2.9* 3.0* 2.8*   No results for input(s): LIPASE, AMYLASE in the last 168 hours. No results for input(s): AMMONIA in the last 168 hours. Coagulation Profile: No results for input(s): INR, PROTIME in the last 168 hours. Cardiac Enzymes: No results for input(s): CKTOTAL, CKMB, CKMBINDEX, TROPONINI in the last 168 hours. BNP (last 3 results) No results for input(s): PROBNP in the last 8760 hours. HbA1C: No results for input(s): HGBA1C in the last 72 hours. CBG: Recent Labs  Lab 09/16/19 2045 09/17/19 0412 09/17/19 0718 09/17/19 1131 09/17/19 1610  GLUCAP 114* 101*  103* 134* 124*   Lipid Profile: No results for input(s): CHOL, HDL, LDLCALC, TRIG, CHOLHDL, LDLDIRECT in the last 72 hours. Thyroid Function Tests: No results for input(s): TSH, T4TOTAL, FREET4, T3FREE, THYROIDAB in the last 72 hours. Anemia Panel: No results for input(s): VITAMINB12, FOLATE, FERRITIN, TIBC, IRON, RETICCTPCT in the last 72 hours. Sepsis Labs: No results for input(s): PROCALCITON, LATICACIDVEN in the last 168 hours.  Recent Results (from the past 240 hour(s))  Respiratory Panel by RT PCR (Flu A&B, Covid) - Nasopharyngeal Swab     Status: None   Collection Time: 09/12/19 10:19 PM   Specimen: Nasopharyngeal Swab  Result Value Ref Range Status   SARS Coronavirus 2 by RT PCR NEGATIVE NEGATIVE Final    Comment: (NOTE) SARS-CoV-2 target nucleic acids are NOT DETECTED. The SARS-CoV-2 RNA is generally detectable in upper respiratoy specimens during the acute phase of infection. The lowest concentration of SARS-CoV-2 viral copies this assay can detect is  131 copies/mL. A negative result does not preclude SARS-Cov-2 infection and should not be used as the sole basis for treatment or other patient management decisions. A negative result may occur with  improper specimen collection/handling, submission of specimen other than nasopharyngeal swab, presence of viral mutation(s) within the areas targeted by this assay, and inadequate number of viral copies (<131 copies/mL). A negative result must be combined with clinical observations, patient history, and epidemiological information. The expected result is Negative. Fact Sheet for Patients:  PinkCheek.be Fact Sheet for Healthcare Providers:  GravelBags.it This test is not yet ap proved or cleared by the [place] FDA and  has been authorized for detection and/or diagnosis of SARS-CoV-2 by FDA under an Emergency Use Authorization (EUA). This EUA will remain  in effect  (meaning this test can be used) for the duration of the COVID-19 declaration under Section 564(b)(1) of the Act, 21 U.S.C. section 360bbb-3(b)(1), unless the authorization is terminated or revoked sooner.    Influenza A by PCR NEGATIVE NEGATIVE Final   Influenza B by PCR NEGATIVE NEGATIVE Final    Comment: (NOTE) The Xpert Xpress SARS-CoV-2/FLU/RSV assay is intended as an aid in  the diagnosis of influenza from Nasopharyngeal swab specimens and  should not be used as a sole basis for treatment. Nasal washings and  aspirates are unacceptable for Xpert Xpress SARS-CoV-2/FLU/RSV  testing. Fact Sheet for Patients: PinkCheek.be Fact Sheet for Healthcare Providers: GravelBags.it This test is not yet approved or cleared by the [place] FDA and  has been authorized for detection and/or diagnosis of SARS-CoV-2 by  FDA under an Emergency Use Authorization (EUA). This EUA will remain  in effect (meaning this test can be used) for the duration of the  Covid-19 declaration under Section 564(b)(1) of the Act, 21  U.S.C. section 360bbb-3(b)(1), unless the authorization is  terminated or revoked. Performed at [facility], [address] [place], [place] [address]   MRSA PCR Screening     Status: None   Collection Time: 09/14/19  3:36 AM   Specimen: Nasal Mucosa; Nasopharyngeal  Result Value Ref Range Status   MRSA by PCR NEGATIVE NEGATIVE Final    Comment:        The GeneXpert MRSA Assay (FDA approved for NASAL specimens only), is one component of a comprehensive MRSA colonization surveillance program. It is not intended to diagnose MRSA infection nor to guide or monitor treatment for MRSA infections. Performed at [facility], [address], [place], [place] [address]   [place] prep     Status: None   Collection Time: 09/15/19 12:37 PM   Specimen: PATH GI Other  Result Value Ref Range Status   Specimen Description ESOPHAGUS   Final   Special Requests NONE  Final   KOH Prep   Final    NO YEAST OR FUNGAL ELEMENTS SEEN Performed at [facility], [address] [place],  [address]    Report Status 09/15/2019 FINAL  Final         Radiology Studies: No results found.      Scheduled Meds:  amiodarone  200 mg Oral Daily   atorvastatin  80 mg Oral Daily   Chlorhexidine Gluconate Cloth  6 each Topical Q0600   clopidogrel  75 mg Oral Daily   doxycycline  100 mg Oral Q12H   insulin aspart  0-9 Units Subcutaneous TID WC   midodrine  10 mg Oral TID WC   pantoprazole  40 mg Oral BID AC   sevelamer  carbonate  1,600 mg Oral TID WC   sodium chloride flush  10-40 mL Intracatheter Q12H   sodium chloride flush  3 mL Intravenous Q12H   Continuous Infusions:  sodium chloride Stopped (09/17/19 0900)   sodium chloride     sodium chloride     albumin human 25 g (09/17/19 1615)   sodium chloride       LOS: 3 days    Time spent: 71mins    Kathie Dike, MD [facility]   If 7PM-7AM, please contact night-coverage [email or web address]  09/17/2019, 5:56 PM

## 2019-09-17 NOTE — Care Management (Signed)
Consult received for DME needs and outpatient palliative follow up. Met with patient bedside, she asked CM to call her daughter, [name]. Placed call, no answer, no option to leave VM. TOC team to follow up tomorrow.

## 2019-09-17 NOTE — Progress Notes (Signed)
MEWS score change to yellow due to patients RR increased. Provider and family at bedside discussing matters that has made patient emotional. Notified MD and charge nurse.  Not a acute change due to the RR rate has not remained elevated. Elodia Florence RN

## 2019-09-17 NOTE — Progress Notes (Signed)
     HEMODIALYSIS TREATMENT NOTE:  3.5 hour heparin-free HD completed via right forearm AVG (retrograde flow confirmed).  Unable to tolerate any UF due to asymptomatic hypotension.  SBP remained in 80s to low 90s for entirety of treatment despite cooling dialysate and administration of Albumin 25g x2 and Midodrine 10mg .  All blood was returned and hemostasis was achieved in 15 minutes.  Net UF +209cc.  Right femoral central line was removed at Dr. Blythe Stanford request as pt now has patent PIV in left forearm.  Line was intact and direct pressure was held x23m. Hemostasis was verified prior to applying pressure bandage.  Pt tolerated procedure without complaint.   Rockwell Alexandria, RN

## 2019-09-17 NOTE — Progress Notes (Signed)
Subjective: Sleeping when I entered the room. Difficult to get patient to stay fully alert during visit to day. She oriented x 4. Stated that the pill was too good for her to swallow this morning.  Denies any typical trouble with swallowing pills or foods.  Ate her breakfast without difficulty this morning.  Denies nausea, vomiting, GERD symptoms, abdominal pain, recent black stools, or bright red blood per rectum.  States black stools occurred 4-5 days ago.  Discussed with nurse Beverlee Nims he states patient's bowel movements morning with green.  No bright red blood.  Ate breakfast without difficulties.  Objective: Vital signs in last 24 hours: Temp:  [97.9 F (36.6 C)-99.2 F (37.3 C)] 97.9 F (36.6 C) (05/04 0435) Pulse Rate:  [72-87] 79 (05/04 0435) Resp:  [15-26] 18 (05/04 0435) BP: (93-124)/(35-93) 93/38 (05/04 0435) SpO2:  [91 %-100 %] 99 % (05/04 0435) Last BM Date: 09/15/19 General:   Alert and oriented, pleasant but somewhat lethargic  Head:  Normocephalic and atraumatic. Eyes:  No icterus, sclera clear. Conjuctiva pink.  Abdomen:  Bowel sounds present, soft, non-tender, non-distended. No HSM or hernias noted. No rebound or guarding. No masses appreciated  Extremities:  Without edema. Neurologic:  Alert and oriented x4 Psych: Normal mood and affect.  Intake/Output from previous day: 05/03 0701 - 05/04 0700 In: 314 [P.O.:240; I.V.:74] Out: -  Intake/Output this shift: No intake/output data recorded.  Lab Results: Recent Labs    09/15/19 0347 09/16/19 0405  WBC 6.4 7.2  HGB 8.9* 8.5*  HCT 28.5* 26.8*  PLT 176 185   BMET Recent Labs    09/15/19 0347 09/16/19 0405  NA 134* 135  K 3.2* 3.3*  CL 95* 95*  CO2 26 27  GLUCOSE 80 117*  BUN 13 21  CREATININE 3.82* 5.88*  CALCIUM 8.6* 8.8*   LFT Recent Labs    09/16/19 0405  ALBUMIN 2.8*    Assessment: 74 year old female presented 08/23/2019 for severe hyperglycemia and noted to be heme positive stool with  black stools. No previous GI history in our system but states EGD/Colonoscopy 2 years ago at [facility]. No colonoscopy or endoscopy in our system. She responded well to D50 but was admitted for likely GI bleed and anemia. She is on Eliquis for atrial fibrillation and is on DAPT with aspirin and Plavix due to recent leg stenting by vascular surgery. She has also recent AKA with hospital discharge 08/31/2019.  GI bleed/anemia: Admitting hemoglobin 9.9 which seems fairly consistent with her baseline recently in the setting of multiple chronic conditions and ESRD.  She did about 1 g drop of hemoglobin during her admission, but since, hemoglobin has remained stable.  Occasional NSAIDs.  Chronically on Eliquis and now will DAPT.  EGD this admission on 5/2 with possible esophageal candidiasis versus dissolved pill (KOH prep negative), single gastric polyp with no bleeding and no stigmata of recent bleed, erythematous mucosa in the gastric antrum, single nonbleeding angiodysplastic lesion in the duodenum.  Last colonoscopy 2016 was normal with recommendations to repeat in 10 years. Plans were to have agile capsule placed today.  Reviewed OR nurse note.  Agile capsule was not successfully placed. The patient stated "pill is too big" and she was unable to swallow it.  She denies any history of dysphagia.  Ate breakfast without difficulties.  No overt GI bleeding at this time.  Discussed with Dr. Oneida Alar, no plans to place agile capsule via EGD.  We will plan to monitor for now.  A. Fib/RVR"- Chronic A. fib and went into RVR on Friday (heart rate 140s-160s) and was transferred to ICU for IV amiodarone and has since had improvement in tachycardia.  Blood pressure remains soft.  Eliquis has been on hold due to anemia/GI bleed; remains on ASA/Plavix.  Will need input from cardiology and vascular to consider risk versus benefits of continuing Eliquis in the setting of GI bleed.  Plan: 1. Repeat CBC to monitor hemoglobin  trend. 2.  Monitor for overt GI bleeding/melena  3.  Continue Protonix 40 mg twice daily. 4.  Continue aspirin and Plavix.  Will defer whether or not Eliquis can be resumed to cardiology/vascular. 5.  Should hemoglobin drift down or if she were to have return of melanotic stools, may need to consider endoscopic placement of agile capsule.   LOS: 3 days    09/17/2019, 7:42 AM   Aliene Altes, PA-C [name]

## 2019-09-17 NOTE — OR Nursing (Signed)
Went to Patient room identified myself and told patient what I was there for . Pt confirmed name, DOB, and confirmed she understood the procedure we were about to perform.  Pt states no pacemaker of AICD, pt does not smoke, pt height 5'6, pt weight 227 as stated by patient. Pt states she is diabetic. Has not had abdominal surgery. Pt given water prior to administering agile capsule.  Pt states "pill is too big"  I attempted 4 times to have her swallow with the HOB elevated to 90 degrees.  Pt stated she "cannot take pill" " its not going anywhere".  Charge nurse for Day shift Crystal notified and she stated she would inform nurse assigned to that room after report.

## 2019-09-17 NOTE — Progress Notes (Signed)
Daily Progress Note   Patient Name: Alexis [name]       Date: 09/17/2019 DOB: 07/14/1945  Age: 74 y.o. MRN#: [id number] Attending Physician: Kathie Dike, MD Primary Care Physician: System, Provider Not In Admit Date: 09/12/2019  Reason for Consultation/Follow-up: Establishing goals of care  Subjective: Patient awake, alert, oriented. Minimally engages in discussion with flat affect. "I want to go home." Denies pain or discomfort.   GOC:  Daughter, Alexis [name] at bedside. Provided HCPOA paperwork with Alexis [name] as primary HCPOA and daughter Alexis [name] as secondary HCPOA. Copy placed in paper chart to be scanned into EMR.   Reviewed course of hospitalization including diagnoses, interventions, plan of care, recommendations from specialists.   Patient tells Alexis [name] and I she wants to go home. Alexis [name] has decided she does NOT want her mother to return to SNF rehab but is planning for discharge home with support from family and Medicaid caps aids. Alexis [name] is hopeful her mother's spirits will improve once home with family. She has been depressed in the nursing facility, especially since family is unable to visit. Patient will need DME: hospital bed, lift, BSC. Reassured Alexis [name] that I would consult TOC team to assist with disposition, DME, if she applies for home health services, and also outpatient palliative referral. Alexis [name] is open to any support possible.   Further discussed patient's advance directive and wishes regarding heroic medical interventions including resuscitation, life support, feeding tube. At this time, patient does desire continued full code/full scope treatment. Introduced and discussed MOST form with patient and daughter. Encouraged limitations to care with underlying chronic conditions and poor chance  of survival/meaningful recovery if patient's condition worsened requiring heroic measures. Encouraged ongoing discussions with her daughter and quality of life aspects. Alexis [name] is appreciative of information and feels her mother will open up about her wishes once she gets her back home. Hard Choices copy given for review.   Answered questions and concerns. Emotional support provided.   Length of Stay: 3  Current Medications: Scheduled Meds:  . amiodarone  200 mg Oral Daily  . aspirin EC  81 mg Oral Daily  . atorvastatin  80 mg Oral Daily  . Chlorhexidine Gluconate Cloth  6 each Topical Q0600  . clopidogrel  75 mg Oral Daily  . doxycycline  100 mg Oral Q12H  . insulin aspart  0-9 Units Subcutaneous TID WC  . midodrine  10 mg Oral TID WC  . pantoprazole  40 mg Oral BID AC  . sevelamer carbonate  1,600 mg Oral TID WC  . sodium chloride flush  10-40 mL Intracatheter Q12H  . sodium chloride flush  3 mL Intravenous Q12H    Continuous Infusions: . sodium chloride 5 mL/hr at 09/14/19 2105  . sodium chloride    . sodium chloride    . sodium chloride      PRN Meds: sodium chloride, sodium chloride, sodium chloride, acetaminophen **OR** acetaminophen, albuterol, ALPRAZolam, lidocaine (PF), lidocaine-prilocaine, ondansetron **OR** ondansetron (ZOFRAN) IV, pentafluoroprop-tetrafluoroeth, promethazine, sodium chloride flush, sodium chloride flush  Physical Exam Vitals and nursing note reviewed.  Constitutional:      General: She is awake.     Appearance: She is ill-appearing.  HENT:     Head: Normocephalic and atraumatic.  Cardiovascular:     Rate and Rhythm: Rhythm irregularly irregular.  Pulmonary:     Effort: No tachypnea, accessory muscle usage or respiratory distress.  Abdominal:     Tenderness: There is no abdominal tenderness.  Neurological:     Mental Status: She is alert and oriented to person, place, and time.  Psychiatric:        Attention and Perception: Attention normal.         Mood and Affect: Affect is flat.        Speech: Speech normal.            Vital Signs: BP (!) 93/38 (BP Location: Left Arm)   Pulse 79   Temp 97.9 F (36.6 C)   Resp 18   Ht 5\' 6"  (1.676 m)   Wt 87.8 kg   SpO2 94%   BMI 31.24 kg/m  SpO2: SpO2: 94 % O2 Device: O2 Device: Room Air O2 Flow Rate: O2 Flow Rate (L/min): 2.5 L/min  Intake/output summary:   Intake/Output Summary (Last 24 hours) at 09/17/2019 1016 Last data filed at 09/16/2019 1504 Gross per 24 hour  Intake 254.01 ml  Output --  Net 254.01 ml   LBM: Last BM Date: 09/15/19 Baseline Weight: Weight: 85 kg Most recent weight: Weight: (227)       Palliative Assessment/Data: PPS 40%    Flowsheet Rows     Most Recent Value  Intake Tab  Referral Department  Hospitalist  Unit at Time of Referral  Intermediate Care Unit  Palliative Care Primary Diagnosis  Other (Comment)  Palliative Care Type  New Palliative care  Reason for referral  Clarify Goals of Care  Date first seen by Palliative Care  09/16/19  Clinical Assessment  Palliative Performance Scale Score  40%  Psychosocial & Spiritual Assessment  Palliative Care Outcomes  Patient/Family meeting held?  Yes  Who was at the meeting?  patient/husband, spoke with daughter via telephone  Palliative Care Outcomes  Clarified goals of care, Provided end of life care assistance, Provided psychosocial or spiritual support, ACP counseling assistance, Linked to palliative care logitudinal support, Improved non-pain symptom therapy      Patient Active Problem List   Diagnosis Date Noted  . Hypoglycemia   . Anxiety state   . Palliative care by specialist   . Goals of care, counseling/discussion   . Pressure injury of skin 09/14/2019  . Hypotension 09/14/2019  . UGI bleed 09/13/2019  . Melena   . Hx of AKA (above knee amputation), left ([place])   . Gangrene of left foot ([place])   . 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 PVD (peripheral  vascular disease) ([place])   . Osteomyelitis ([place]) 08/18/2019  . Diabetic foot ulcers ([place]) 08/18/2019  . Emesis 08/18/2019  . Altered mental status 06/11/2019  . Generalized weakness 06/11/2019  . Acute delirium 06/11/2019  . Hallucinations 06/11/2019  . Acute CVA (cerebrovascular accident) ([place]) 06/11/2019  . Hypokalemia 05/12/2019  . Hyperlipidemia   . Pulmonary hypertension ([place])   . GERD (gastroesophageal reflux disease)   . Stable angina ([facility])   . Atrial fibrillation with rapid ventricular response ([place]) 05/09/2019  . Elevated troponin   . ESRD on dialysis ([place])   . Dyspnea 08/13/2017  . Chest pain 08/04/2017  . 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 Essential hypertension 08/15/2012  . CAD (coronary artery disease) 03/25/2011  . Type II diabetes mellitus with manifestations ([place]) 03/25/2011  . Chest pain 03/25/2011  . CHF (congestive heart failure) ([place]) 10/14/2010  . Anemia in chronic kidney disease (CKD) 10/14/2010  . Obesity 10/14/2010    Palliative Care Assessment & Plan   Patient Profile: 74 y.o. female  with past medical history of ESRD on hemodialysis, DM type 2, PVD, recent left AKA, chronic osteomyelitis, afib on Eliquis, HTN, HLD, neuropathy, CHF, discharged on 4/17 following left AKA and started on dual antiplatelet therapy for drug-eluting stent placed in right lower extremity admitted on 09/12/2019 with severe hypoglycemia and altered mental status. Hospital admission for workup of GI bleeding. EGD 5/2 that was negative for active bleeding. Chronic hypotension on midodrine. RLE chronic osteomyelitis on doxycycline with stop date of 09/18/19 followed by ortho. Palliative medicine consultation for goals of care.   Assessment: ESRD on hemodialysis GI bleeding Anemia of CKD Afib with RVR Chronic hypotension PVD Recent left AKA Chronic RLE osteomyelitis  Recommendations/Plan:  Continue full code/full scope treatment per  patient wishes.  Continue dialysis.   Continue outpatient ortho f/u.   Continue PT/OT attempts.  Documented HCPOA paperwork placed in paper chart to be scanned into EMR. Daughters Alexis [name] and Alexis [name]) are documented HCPOA's.  Introduced and discussed MOST form with patient and daughter. Encouraged ongoing discussions about her wishes while she is of sound mind.  Daughter declines repeat SNF placement. Daughter plans to take patient home with support from family and Medicaid caps aids. TOC team consulted to assist with disposition. Daughter interested in home health services and outpatient palliative referral. Daughter hopeful her mother will be in better spirits once home with family.   Consider low dose antidepressant. Patient feels depressed and daughter reports depression since multiple readmissions and loss of independence.   Code Status: FULL   Code Status Orders  (From admission, onward)         Start     Ordered   09/13/19 0215  Full code  Continuous     09/13/19 0214        Code Status History    Date Active Date Inactive Code Status Order ID Comments User Context   08/18/2019 1940 08/31/2019 2028 Full Code [id number]  Shela Leff, MD Inpatient   06/11/2019 1658 06/14/2019 1735 Full Code [id number]  Murlean Iba, MD Inpatient   05/09/2019 1352 05/15/2019 1726 Full Code [id number]  Rodena Goldmann, DO ED   02/09/2018 1400 02/09/2018 1808 Full Code [id number]  Elam Dutch, MD Inpatient   08/13/2017 1733 08/15/2017 0025 Full Code [id number]  Jani Gravel, MD Inpatient   08/04/2017 1753 08/06/2017 1249 Full Code [id number]  [name] Simmonds  Ocie Bob, MD ED   08/11/2014 0934 08/11/2014 1834 Full Code [id number]  Angelia Mould, MD Inpatient   03/25/2011 1830 03/25/2011 1923 Full Code [id number]  Minus Breeding, MD ED   Advance Care Planning Activity       Prognosis:  Guarded: multiple chronic conditions and recurrent hospitalizations.   Discharge Planning:  [facility] for rehab with Palliative care service follow-up  Care plan was discussed with Dr. Roderic Palau, RN, patient, daughter Alexis [name])  Thank you for allowing the Palliative Medicine Team to assist in the care of this patient.   Time In: 1305 Time Out: 1345 Total Time 40 Prolonged Time Billed  no      Greater than 50%  of this time was spent counseling and coordinating care related to the above assessment and plan.  Ihor Dow, DNP, FNP-C Palliative Medicine Team  Phone: [phone] Fax: [phone]  Please contact Palliative Medicine Team phone at [phone] for questions and concerns.

## 2019-09-18 ENCOUNTER — Ambulatory Visit: Payer: Medicare Other | Admitting: Family

## 2019-09-18 ENCOUNTER — Encounter (HOSPITAL_COMMUNITY): Payer: Self-pay | Admitting: Internal Medicine

## 2019-09-18 ENCOUNTER — Encounter (HOSPITAL_COMMUNITY)
Admission: EM | Disposition: A | Discharge status: Skilled Nursing Facility | Payer: Self-pay | Attending: Family Medicine

## 2019-09-18 DIAGNOSIS — K31819 Angiodysplasia of stomach and duodenum without bleeding: Secondary | ICD-10-CM

## 2019-09-18 DIAGNOSIS — Z7189 Other specified counseling: Secondary | ICD-10-CM

## 2019-09-18 DIAGNOSIS — K317 Polyp of stomach and duodenum: Secondary | ICD-10-CM

## 2019-09-18 DIAGNOSIS — K921 Melena: Secondary | ICD-10-CM | POA: Diagnosis not present

## 2019-09-18 HISTORY — PX: AGILE CAPSULE: SHX5420

## 2019-09-18 HISTORY — PX: ESOPHAGOGASTRODUODENOSCOPY: SHX5428

## 2019-09-18 LAB — GLUCOSE, CAPILLARY
Glucose-Capillary: 117 mg/dL — ABNORMAL HIGH (ref 70–99)
Glucose-Capillary: 101 mg/dL — ABNORMAL HIGH (ref 70–99)
Glucose-Capillary: 104 mg/dL — ABNORMAL HIGH (ref 70–99)
Glucose-Capillary: 105 mg/dL — ABNORMAL HIGH (ref 70–99)
Glucose-Capillary: 72 mg/dL (ref 70–99)
Glucose-Capillary: 114 mg/dL — ABNORMAL HIGH (ref 70–99)

## 2019-09-18 LAB — RENAL FUNCTION PANEL
Albumin: 3.3 g/dL — ABNORMAL LOW (ref 3.5–5.0)
Anion gap: 8 (ref 5–15)
BUN: 16 mg/dL (ref 8–23)
CO2: 29 mmol/L (ref 22–32)
Calcium: 8.5 mg/dL — ABNORMAL LOW (ref 8.9–10.3)
Chloride: 98 mmol/L (ref 98–111)
Creatinine, Ser: 4.55 mg/dL — ABNORMAL HIGH (ref 0.44–1.00)
GFR calc Af Amer: 10 mL/min — ABNORMAL LOW (ref >60)
GFR calc non Af Amer: 9 mL/min — ABNORMAL LOW (ref >60)
Glucose, Bld: 145 mg/dL — ABNORMAL HIGH (ref 70–99)
Phosphorus: 2.5 mg/dL (ref 2.5–4.6)
Potassium: 3.6 mmol/L (ref 3.5–5.1)
Sodium: 135 mmol/L (ref 135–145)

## 2019-09-18 LAB — CBC
HCT: 26.5 % — ABNORMAL LOW (ref 36.0–46.0)
Hemoglobin: 8.5 g/dL — ABNORMAL LOW (ref 12.0–15.0)
MCH: 26.1 pg (ref 26.0–34.0)
MCHC: 32.1 g/dL (ref 30.0–36.0)
MCV: 81.3 fL (ref 80.0–100.0)
Platelets: 136 10*3/uL — ABNORMAL LOW (ref 150–400)
RBC: 3.26 MIL/uL — ABNORMAL LOW (ref 3.87–5.11)
RDW: 19 % — ABNORMAL HIGH (ref 11.5–15.5)
WBC: 7 10*3/uL (ref 4.0–10.5)
nRBC: 0 % (ref 0.0–0.2)

## 2019-09-18 SURGERY — EGD (ESOPHAGOGASTRODUODENOSCOPY)
Anesthesia: Moderate Sedation

## 2019-09-18 MED ORDER — ONDANSETRON HCL 4 MG/2ML IJ SOLN
INTRAMUSCULAR | Status: AC
  Filled 2019-09-18: qty 2

## 2019-09-18 MED ORDER — SODIUM CHLORIDE 0.9 % IV SOLN: INTRAVENOUS | Status: DC

## 2019-09-18 MED ORDER — DARBEPOETIN ALFA 100 MCG/0.5ML IJ SOSY
100.0000 ug | PREFILLED_SYRINGE | INTRAMUSCULAR | Status: DC
  Filled 2019-09-18: qty 0.5

## 2019-09-18 MED ORDER — FENTANYL CITRATE (PF) 100 MCG/2ML IJ SOLN
INTRAMUSCULAR | Status: DC | PRN
  Administered 2019-09-18: 12.5 ug via INTRAVENOUS

## 2019-09-18 MED ORDER — SEVELAMER CARBONATE 800 MG PO TABS
800.0000 mg | ORAL_TABLET | Freq: Three times a day (TID) | ORAL | Status: DC
  Administered 2019-09-21 – 2019-09-25 (×12): 800 mg via ORAL
  Filled 2019-09-18 (×17): qty 1

## 2019-09-18 MED ORDER — LIDOCAINE VISCOUS HCL 2 % MT SOLN
OROMUCOSAL | Status: AC
  Filled 2019-09-18: qty 15

## 2019-09-18 MED ORDER — MIDAZOLAM HCL 5 MG/5ML IJ SOLN
INTRAMUSCULAR | Status: AC
  Filled 2019-09-18: qty 5

## 2019-09-18 MED ORDER — STERILE WATER FOR IRRIGATION IR SOLN
Status: DC | PRN
  Administered 2019-09-18: 2.5 mL

## 2019-09-18 MED ORDER — FENTANYL CITRATE (PF) 100 MCG/2ML IJ SOLN
INTRAMUSCULAR | Status: AC
  Filled 2019-09-18: qty 2

## 2019-09-18 MED ORDER — CHLORHEXIDINE GLUCONATE CLOTH 2 % EX PADS
6.0000 | MEDICATED_PAD | Freq: Every day | CUTANEOUS | Status: DC
  Administered 2019-09-18 – 2019-09-25 (×5): 6 via TOPICAL

## 2019-09-18 MED ORDER — MIDAZOLAM HCL 5 MG/5ML IJ SOLN
INTRAMUSCULAR | Status: DC | PRN
  Administered 2019-09-18: 1 mg via INTRAVENOUS

## 2019-09-18 MED ORDER — ONDANSETRON HCL 4 MG/2ML IJ SOLN
INTRAMUSCULAR | Status: DC | PRN
  Administered 2019-09-18: 4 mg via INTRAVENOUS

## 2019-09-18 NOTE — Progress Notes (Signed)
Telephone consent obtained from pt's daughter Sherri Rad for Agile capsule study today. Consent verified by me and Gean Maidens, RN.

## 2019-09-18 NOTE — Progress Notes (Addendum)
    Subjective: Appears nervous when I enter room. Limited historian. Knows she is in the hospital and states the month is "2021", then states it is April. Believes she is in [place]. No abdominal pain. Poor appetite per patient.   Spoke with nurse tech and reviewed oral intake. Very little intake over past 24 hours except liquid. Last BM yesterday documented. No blood that nurse tech is aware.   Objective: Vital signs in last 24 hours: Temp:  [98.4 F (36.9 C)-99.8 F (37.7 C)] 99.4 F (37.4 C) (05/05 0441) Pulse Rate:  [75-85] 85 (05/05 0441) Resp:  [20-24] 20 (05/05 0441) BP: (84-114)/(42-54) 103/50 (05/05 0441) SpO2:  [94 %-100 %] 98 % (05/05 0441) Weight:  [86.8 kg] 86.8 kg (05/04 1630) Last BM Date: 09/15/19 General:   Alert, startled when entering room and appears shy/withdrawn, mild confusion, oriented to person, year, place Head:  Normocephalic and atraumatic. Abdomen:  Bowel sounds present, soft, obese, non-tender, non-distended.  Extremities:  Left AKA Psych:  Flat affect   Intake/Output from previous day: 05/04 0701 - 05/05 0700 In: 150 [P.O.:100; IV Piggyback:50] Out: -209  Intake/Output this shift: No intake/output data recorded.  Lab Results: Recent Labs    09/16/19 0405 09/17/19 1221 09/18/19 0430  WBC 7.2 7.0 7.0  HGB 8.5* 8.7* 8.5*  HCT 26.8* 27.4* 26.5*  PLT 185 168 136*   BMET Recent Labs    09/16/19 0405 09/18/19 0430  NA 135 135  K 3.3* 3.6  CL 95* 98  CO2 27 29  GLUCOSE 117* 145*  BUN 21 16  CREATININE 5.88* 4.55*  CALCIUM 8.8* 8.5*   LFT Recent Labs    09/16/19 0405 09/18/19 0430  ALBUMIN 2.8* 3.3*    Assessment: 74 year old female admitted with severe hypoglycemia, found to be hypotensive with heme positive stool/melena in setting of chronic Eliquis, Plavix, and aspirin, Hgb without significant drop and near baseline. No transfusion needed. Clinical course complicated by recent DES placed in right superficial femoral-popliteal  artery on 08/22/2019, on Plavix and aspirin, along with Eliquis with high risk of stroke/TIA (CHADSVASc score 7, stroke in 05/2019). Currently, aspirin and Eliquis are both on hold, with Eliquis held since admission. Difficult situation with risk of stroke high and need for anticoagulation, with Eliquis on hold until GI evaluation complete.   Heme positive stool: EGD completed this admission with mucosal coating of distal esophagus but negative for candida, most likely pill-related, single gastric polyp, erythematous antrum, single non-bleeding angiodysplastic lesion in duodenum. Outside colonoscopy normal in 2016. Capsule study recommended but needs Agile first prior to capsule study. Unable to swallow Agile capsule yesterday. EGD recommended for Agile capsule placement. She has been NPO since midnight.   Anemia of chronic disease: ESRD on dialysis. Hgb 12 range in Dec 2020. 8-9 range over past few months. Today 8.5, stable.   Plan:  Remain NPO except sips with meds  EGD with conscious sedation today to place Agile capsule by Dr. Gala Romney. I have spoken with nursing staff and informed. Reaching out to [facility], Sherri Rad [phone]) to update.   Continue to hold Eliquis and aspirin. Remains on Plavix  Annitta Needs, PhD, ANP-BC [facility]      LOS: 4 days    09/18/2019, 8:02 AM  ADDENDUM at 1020: I was able to contact Sherri Rad, daughter, and review procedure for today. She is agreeable to proceed. Telephone consent obtained by nursing staff. Annitta Needs, PhD, ANP-BC [facility]

## 2019-09-18 NOTE — Progress Notes (Signed)
  Agile capsule successfully placed via EGD. May have clear liquids at 1930. Resume renal diet tomorrow morning. Discussed with nursing staff. Abdominal xray ordered for 1530 tomorrow.

## 2019-09-18 NOTE — Progress Notes (Signed)
I spoke with patient regarding EGD and agile capsule placement. She is confused, unable to state the year, place, and states she has been in the hospital for weeks. Continues to state she wants no procedure. I have contacted daughter, who has already given consent. She is aware her mother is refusing. She would like for her to proceed with procedure today.

## 2019-09-18 NOTE — Progress Notes (Signed)
Admit: 09/12/2019 LOS: 4  39F ESRD THS [facility] with AMS, melelena on DAPT+DOAC  Subjective:  Palliative care has seen.  Last HD on 5/4 with 262mL net positive after HD.  She was unable to tolerate UF due to hypotension despite cooling dialysate and albumin 25 gram x2 and midodrine 10 mg.     Review of systems: Denies shortness of breath  States doesn't make any urine  No n/v  05/04 0701 - 05/05 0700 In: 150 [P.O.:100; IV Piggyback:50] Out: -209   Filed Weights   09/14/19 2245 09/16/19 0400 09/17/19 1630  Weight: 84.6 kg 87.8 kg 86.8 kg    Scheduled Meds: . amiodarone  200 mg Oral Daily  . atorvastatin  80 mg Oral Daily  . Chlorhexidine Gluconate Cloth  6 each Topical Q0600  . clopidogrel  75 mg Oral Daily  . doxycycline  100 mg Oral Q12H  . insulin aspart  0-9 Units Subcutaneous TID WC  . midodrine  10 mg Oral TID WC  . pantoprazole  40 mg Oral BID AC  . sevelamer carbonate  1,600 mg Oral TID WC  . sodium chloride flush  10-40 mL Intracatheter Q12H  . sodium chloride flush  3 mL Intravenous Q12H   Continuous Infusions: . sodium chloride Stopped (09/17/19 0900)  . sodium chloride    . sodium chloride    . albumin human 25 g (09/17/19 1615)  . sodium chloride     PRN Meds:.sodium chloride, sodium chloride, sodium chloride, acetaminophen **OR** acetaminophen, albumin human, albuterol, ALPRAZolam, lidocaine (PF), lidocaine-prilocaine, ondansetron **OR** ondansetron (ZOFRAN) IV, pentafluoroprop-tetrafluoroeth, promethazine, sodium chloride flush, sodium chloride flush  Current Labs: reviewed    Physical Exam:  Blood pressure (!) 103/50, pulse 85, temperature 99.4 F (37.4 C), temperature source Oral, resp. rate 20, height 5\' 6"  (1.676 m), weight 86.8 kg, SpO2 98 %. NAD, awake in NAD S1S2 no rub CTAB and unlabored No sig LEE appreciated AVG RFA +B/T RUE  Dialysis Orders:[facility] T,Th,S 4.25 hr 400/600 85 kg 2.0 K/2.5 Ca R AVG -Heparin 2000 units IV initial  bolus then 1000 units/hr. Stop 1 hour before end of treatment -Epogen 3600 units IV TIW -Hectorol 1.5 mcg IV TIW -Venofer 50 mg IV weekly  A 1. 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 Melena, EGD 5/2, neg for acute blood loss; per GI 3. Anemia: Hb 8s  4. CKD-BMD: sevelamer 5. 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 AMS, improved 7. Hypotension: on chornic midodrine.  Not tolerating HD well as above  P . HD per TTS schedule - she is not tolerating UF well   . Start ESA - aranesp 100 mcg weekly for now - first dose 5/5.  Marland Kitchen Reduce sevelamer to 800 mg TID with meals for now phos 2.5 today . Cont goals of care / Palliative discussions . Medication Issues; o Preferred narcotic agents for pain control are hydromorphone, fentanyl, and methadone. Morphine should not be used.  o Baclofen should be avoided o Avoid oral sodium phosphate and magnesium citrate based laxatives / bowel preps   Claudia Desanctis, MD 09/18/2019, 9:38 AM   Recent Labs  Lab 09/15/19 0347 09/16/19 0405 09/18/19 0430  NA 134* 135 135  K 3.2* 3.3* 3.6  CL 95* 95* 98  CO2 26 27 29   GLUCOSE 80 117* 145*  BUN 13 21 16   CREATININE 3.82* 5.88* 4.55*  CALCIUM 8.6* 8.8* 8.5*  PHOS  --  4.0 2.5   Recent Labs  Lab 09/12/19 2026  09/13/19 0626 09/16/19 0405 09/17/19 1221 09/18/19 0430  WBC 6.5   < > 7.2 7.0 7.0  NEUTROABS 5.2  --   --   --   --   HGB 9.9*   < > 8.5* 8.7* 8.5*  HCT 31.0*   < > 26.8* 27.4* 26.5*  MCV 81.4   < > 81.7 82.0 81.3  PLT 238   < > 185 168 136*   < > = values in this interval not displayed.

## 2019-09-18 NOTE — TOC Initial Note (Signed)
Transition of Care [facility]) - Initial/Assessment Note   Patient Details  Name: Alexis [name] MRN: [id number] Date of Birth: 25-Jan-1946  Transition of Care [facility]) CM/SW Contact:    Sherie Don, LCSW Phone Number: 09/18/2019, 5:20 PM  Clinical Narrative: Patient is a 74 year old female who was admitted to the hospital for UGI bleed. TOC received consult for HH/DME needs and possible referral for outpatient palliative care. CSW spoke with patient's daughter, Sherri Rad, to discuss patient's needs. Per daughter, patient will discharge home to her apartment with her husband.  CSW asked daughter about palliative care and daughter agreeable to referral to [facility]. Daughter confirmed patient is in need of a hospital bed, BSC, and Hoyer lift. Patient currently only has a walker at home. CSW asked about [facility] providers. Daughter reported no preference for [facility] providers, but asked if a referral could be made to one that has [facility] aide availability.  CSW called Margaretmary Eddy with [facility] to make palliative referral. Referral was accepted. CSW called Juliann Pulse with [facility] to make a referral for DME. [facility] accepted referral.    Expected Discharge Plan: [place] Barriers to Discharge: Continued Medical Work up  Patient Goals and CMS Choice Patient states their goals for this hospitalization and ongoing recovery are:: Return home with palliative care CMS Medicare.gov Compare Post Acute Care list provided to:: Patient Represenative (must comment)(Wendy Owens Shark (daughter)) Choice offered to / list presented to : Adult Children(Wendy Owens Shark (daughter))  Expected Discharge Plan and Services Expected Discharge Plan: [place] Acute Care Choice: Durable Medical Equipment, Home Health Living arrangements for the past 2 months: Apartment            DME Arranged: Bedside commode, Hospital bed DME Agency: [facility] Date DME Agency Contacted: 09/18/19 Time DME Agency Contacted:  [phone] Representative spoke with at DME Agency: Blake Divine  Prior Living Arrangements/Services Living arrangements for the past 2 months: [place] with:: Spouse Patient language and need for interpreter reviewed:: Yes Do you feel safe going back to the place where you live?: Yes      Need for Family Participation in Patient Care: Yes (Comment)(Patient requested her daughter handle her care) Care giver support system in place?: Yes (comment)(Wendy Owens Shark (daughter), Riot Barrick (husband))   Criminal Activity/Legal Involvement Pertinent to Current Situation/Hospitalization: No - Comment as needed  Activities of Daily Living Home Assistive Devices/Equipment: None ADL Screening (condition at time of admission) Patient's cognitive ability adequate to safely complete daily activities?: Yes Is the patient deaf or have difficulty hearing?: No Does the patient have difficulty seeing, even when wearing glasses/contacts?: No Does the patient have difficulty concentrating, remembering, or making decisions?: No Patient able to express need for assistance with ADLs?: Yes Does the patient have difficulty dressing or bathing?: No Independently performs ADLs?: Yes (appropriate for developmental age) Does the patient have difficulty walking or climbing stairs?: No Weakness of Legs: None Weakness of Arms/Hands: None  Permission Sought/Granted Permission sought to share information with : Family Supports Permission granted to share information with : Yes, Verbal Permission Granted  Share Information with NAME: Sherri Rad     Permission granted to share info w Relationship: Daughter  Permission granted to share info w Contact Information: [phone]  Emotional Assessment Appearance:: Appears stated age Attitude/Demeanor/Rapport: Unable to Assess Affect (typically observed): Unable to Assess Orientation: : Oriented to Self, Oriented to Place, Oriented to  Time, Oriented to  Situation Alcohol / Substance Use: Not Applicable  Psych Involvement: No (comment)  Admission diagnosis:  Hypoglycemia [E16.2] GI bleed [K92.2] UGI bleed [K92.2] Patient Active Problem List   Diagnosis Date Noted  . Hypoglycemia   . Anxiety state   . Palliative care by specialist   . Goals of care, counseling/discussion   . Pressure injury of skin 09/14/2019  . Hypotension 09/14/2019  . UGI bleed 09/13/2019  . Melena   . Hx of AKA (above knee amputation), left ([place])   . Gangrene of left foot ([place])   . Diabetic ulcer of right ankle with necrosis of bone ([place])   . PVD (peripheral vascular disease) ([place])   . Osteomyelitis ([place]) 08/18/2019  . Diabetic foot ulcers ([place]) 08/18/2019  . Emesis 08/18/2019  . Altered mental status 06/11/2019  . Generalized weakness 06/11/2019  . Acute delirium 06/11/2019  . Hallucinations 06/11/2019  . Acute CVA (cerebrovascular accident) ([place]) 06/11/2019  . Hypokalemia 05/12/2019  . Hyperlipidemia   . Pulmonary hypertension ([place])   . GERD (gastroesophageal reflux disease)   . Stable angina (HCC)   . Atrial fibrillation with rapid ventricular response ([place]) 05/09/2019  . Elevated troponin   . ESRD on dialysis ([place])   . Dyspnea 08/13/2017  . Chest pain 08/04/2017  . 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 Essential hypertension 08/15/2012  . CAD (coronary artery disease) 03/25/2011  . Type II diabetes mellitus with manifestations ([place]) 03/25/2011  . Chest pain 03/25/2011  . CHF (congestive heart failure) ([place]) 10/14/2010  . Anemia in chronic kidney disease (CKD) 10/14/2010  . Obesity 10/14/2010   PCP:  System, Provider Not In Pharmacy:   [facility] [place], [address] [place] [address] Phone: [phone] Fax: [phone]  Readmission Risk Interventions Readmission Risk Prevention Plan 06/12/2019  Transportation  Screening Complete  PCP or Specialist Appt within 3-5 Days Not Complete  HRI or Home Care Consult Complete  Social Work Consult for [place] Planning/Counseling Complete  Palliative Care Screening Not Complete  Medication Review Press photographer) Complete  Some recent data might be hidden

## 2019-09-18 NOTE — Progress Notes (Signed)
[facility]) Hospital Liaison RN note  Received referral for community based palliative care upon discharge with Manufacturing engineer.   Will follow for disposition.  Please call with any questions.  Thank you. Margaretmary Eddy, BSN, RN [place]) [phone]

## 2019-09-18 NOTE — Progress Notes (Signed)
Pt returned from endoscopy via stretcher. Transferred to regular bed without diff. Pt denies c/o. VSS. Pt advised that she will be able to have clear liquids at [place] per MD order. Stated, "OK".

## 2019-09-18 NOTE — Progress Notes (Addendum)
PROGRESS NOTE    Alexis [name]  ZOX:[id number] DOB: Apr 10, 1946 DOA: 09/12/2019 PCP: System, Provider Not In    Brief Narrative:  74 year old female with a history of atrial fibrillation on anticoagulation, end-stage renal disease, coronary artery disease, peripheral vascular disease, diabetes, was recently discharged from the hospital on 08/31/19 after she underwent left above-the-knee amputation and was started on dual antiplatelet therapy for drug-eluting stent placed in her right lower extremity.  She was admitted to the hospital with severe hypoglycemia.  She was cool and clammy and hemodialysis.  Further work-up showed that she was hypotensive and guaiac positive.  She was admitted for further work-up of GI bleeding.  Assessment & Plan:   Active Problems:   Anemia in chronic kidney disease (CKD)   Type II diabetes mellitus with manifestations (HCC)   ESRD on dialysis [facility])   Atrial fibrillation with rapid ventricular response (HCC)   Osteomyelitis (HCC)   UGI bleed   Melena   Hx of AKA (above knee amputation), left (HCC)   Pressure injury of skin   Hypotension   Hypoglycemia   Anxiety state   Palliative care by specialist   Goals of care, counseling/discussion   1. GI bleed.  Patient had guaiac positive stools.  She is on chronic Eliquis and dual antiplatelet therapy with aspirin and Plavix.  Eliquis currently on hold.    She was seen by gastroenterology and underwent EGD on 5/2 that did not show any signs of active bleeding.  Patient was unable to swallow agile capsule today.  Dr. Gala Romney placed agile capsule on 5/5 endoscopically. 2. Anemia of chronic kidney disease.  Hemoglobin is currently stable.  She is not had a significant decline due to GI bleeding.  Continue to monitor. 3. Altered mental status.  Secondary to hypoglycemia.  She received D50.  Overall blood sugars have improved.  Mental status is also improved. 4. Atrial fibrillation without ventricular response.   Chronically on amiodarone.  Eliquis on hold due to GI bleeding.  She was treated with amiodarone infusion.  Heart rates are now improved and she is back on oral amiodarone.  Discussed with cardiology, Dr. Domenic Polite regarding risks and benefits of continuing Eliquis.  Currently, her CHADSVASc score is 7, putting her at a 15.7% risk of stroke/TIA.  She recently had a stroke and 05/2019, making her high risk for recurrence. 5. End-stage renal disease on hemodialysis, Tuesday, Thursday, Saturday.  Nephrology following. 6. Hypotension.  Patient has chronic hypotension and is on midodrine.  Midodrine dose was increased to 10 mg 3 times daily.  Overall blood pressures are improved.. 7. Right lower extremity chronic osteomyelitis.  She is on chronic doxycycline with a stop date of 09/18/2019.  Follow-up with orthopedics. 8. Diabetes.  Started on sliding scale insulin.  Blood sugars have been stable. 9. Peripheral vascular disease.  Recently had drug-eluting stent placed in the right superficial femoral-popliteal artery on 08/22/2019.  She is on dual antiplatelet therapy.  Discussed with Dr. Doren Custard on-call for vascular surgery who agreed with stopping aspirin for now, but requested the Plavix be continued due to recent stent. 10. Anticoagulation---.  This is a complicated situation.  Patient will be at risk for bleeding with anticoagulation, although her risk of stroke is also very high.  Palliative care has initiated discussions regarding goals of care, patient/family wish to continue full scope of care at this time.  May need to try the patient back on Eliquis once GI work-up is complete and monitor for any bleeding  recurrence. 11)Distal esophagitis with overlying exudate almost certainly chemical induced (medication)--- liquid diet and Convert as many pills/ capsules to the elixir form as feasible.  12) dementia--- patient with underlying memory and cognitive deficits  DVT prophylaxis: SCDs Code Status: Full  code Family Communication: Try to contact daughter (POA), no answer, unable to leave voicemail Disposition Plan: Status is: Inpatient  Remains inpatient appropriate because:Inpatient level of care appropriate due to severity of illness.  -Not tolerating oral intake well, repeat EGD with agile patency capsule placement on 09/18/2019  Dispo: The patient is from: SNF              Anticipated d/c is to: Home with home health              Anticipated d/c date is: In 1 to 2 days pending GI work-up--- and if tolerating oral intake well              Barrier--Patient currently is not medically stable to d/c.  Consultants:   Gastroenterology  Nephrology  General surgery for central line placement  Palliative care  Procedures:   5/1 right femoral vein central line, removed on 5/4 5/2 EGD: Normal hypopharynx.                           - Normal proximal esophagus and mid esophagus.                           - Mucosal coating at distal esophagus. ? candida                            esophagits. ? dissolved pill. Cells for cytology                            obtained.                           - Z-line irregular, 39 cm from the incisors.                           - A single gastric polyp.                           - Erythematous mucosa in the antrum.                           - Normal duodenal bulb.                           - A single non-bleeding angiodysplastic lesion in                            the duodenum  -09/18/19-EGD with agile patency capsule placement  Antimicrobials:       Subjective: No melena, no nausea or vomiting.  Tolerating diet.  Objective: Vitals:   09/18/19 1550 09/18/19 1555 09/18/19 1610 09/18/19 1800  BP: 119/62 (!) 120/54 (!) 118/52 (!) 111/59  Pulse: 81 81 79 73  Resp: (!) 21 (!) 23 18 18   Temp:      TempSrc:      SpO2: 100% 100%  100% 100%  Weight:      Height:        Intake/Output Summary (Last 24 hours) at 09/18/2019 1852 Last data filed at  09/18/2019 1539 Gross per 24 hour  Intake 250 ml  Output -209 ml  Net 459 ml   Filed Weights   09/16/19 0400 09/17/19 1630 09/18/19 1448  Weight: 87.8 kg 86.8 kg 86.8 kg    Examination:  General exam: Alert, awake, in no acute distress respiratory system: Clear to auscultation. Respiratory effort normal. Cardiovascular system: Irregular. No murmurs, rubs, gallops. Gastrointestinal system: Abdomen is nondistended, soft and nontender. No organomegaly or masses felt. Normal bowel sounds heard. Central nervous system: Alert and oriented.  Generalized weakness, no focal neurological deficits. Extremities: Left above-the-knee amputation--- staples intact Skin: No rashes, lesions or ulcers Psychiatry: Baseline Memory and cognitive deficits  Data Reviewed: I have personally reviewed following labs and imaging studies  CBC: Recent Labs  Lab 09/12/19 2026 09/13/19 0626 09/14/19 0454 09/15/19 0347 09/16/19 0405 09/17/19 1221 09/18/19 0430  WBC 6.5   < > 6.9 6.4 7.2 7.0 7.0  NEUTROABS 5.2  --   --   --   --   --   --   HGB 9.9*   < > 9.0* 8.9* 8.5* 8.7* 8.5*  HCT 31.0*   < > 29.3* 28.5* 26.8* 27.4* 26.5*  MCV 81.4   < > 83.2 81.7 81.7 82.0 81.3  PLT 238   < > 212 176 185 168 136*   < > = values in this interval not displayed.   Basic Metabolic Panel: Recent Labs  Lab 09/13/19 0626 09/14/19 0454 09/15/19 0347 09/16/19 0405 09/18/19 0430  NA 135 134* 134* 135 135  K 3.7 4.4 3.2* 3.3* 3.6  CL 95* 94* 95* 95* 98  CO2 23 23 26 27 29   GLUCOSE 98 155* 80 117* 145*  BUN 20 28* 13 21 16   CREATININE 5.15* 6.72* 3.82* 5.88* 4.55*  CALCIUM 8.9 9.0 8.6* 8.8* 8.5*  PHOS  --   --   --  4.0 2.5   GFR: Estimated Creatinine Clearance: 12.2 mL/min (A) (by C-G formula based on SCr of 4.55 mg/dL (H)). Liver Function Tests: Recent Labs  Lab 09/12/19 2026 09/13/19 0626 09/16/19 0405 09/18/19 0430  AST 24 19  --   --   ALT 9 8  --   --   ALKPHOS 51 47  --   --   BILITOT 0.5 0.9  --    --   PROT 8.2* 7.3  --   --   ALBUMIN 2.9* 3.0* 2.8* 3.3*   No results for input(s): LIPASE, AMYLASE in the last 168 hours. No results for input(s): AMMONIA in the last 168 hours. Coagulation Profile: No results for input(s): INR, PROTIME in the last 168 hours. Cardiac Enzymes: No results for input(s): CKTOTAL, CKMB, CKMBINDEX, TROPONINI in the last 168 hours. BNP (last 3 results) No results for input(s): PROBNP in the last 8760 hours. HbA1C: No results for input(s): HGBA1C in the last 72 hours. CBG: Recent Labs  Lab 09/17/19 2025 09/18/19 0738 09/18/19 1106 09/18/19 1450 09/18/19 1640  GLUCAP 97 117* 101* 104* 105*   Lipid Profile: No results for input(s): CHOL, HDL, LDLCALC, TRIG, CHOLHDL, LDLDIRECT in the last 72 hours. Thyroid Function Tests: No results for input(s): TSH, T4TOTAL, FREET4, T3FREE, THYROIDAB in the last 72 hours. Anemia Panel: No results for input(s): VITAMINB12, FOLATE, FERRITIN, TIBC, IRON, RETICCTPCT in the last 72 hours. Sepsis Labs:  No results for input(s): PROCALCITON, LATICACIDVEN in the last 168 hours.  Recent Results (from the past 240 hour(s))  Respiratory Panel by RT PCR (Flu A&B, Covid) - Nasopharyngeal Swab     Status: None   Collection Time: 09/12/19 10:19 PM   Specimen: Nasopharyngeal Swab  Result Value Ref Range Status   SARS Coronavirus 2 by RT PCR NEGATIVE NEGATIVE Final    Comment: (NOTE) SARS-CoV-2 target nucleic acids are NOT DETECTED. The SARS-CoV-2 RNA is generally detectable in upper respiratoy specimens during the acute phase of infection. The lowest concentration of SARS-CoV-2 viral copies this assay can detect is 131 copies/mL. A negative result does not preclude SARS-Cov-2 infection and should not be used as the sole basis for treatment or other patient management decisions. A negative result may occur with  improper specimen collection/handling, submission of specimen other than nasopharyngeal swab, presence of viral  mutation(s) within the areas targeted by this assay, and inadequate number of viral copies (<131 copies/mL). A negative result must be combined with clinical observations, patient history, and epidemiological information. The expected result is Negative. Fact Sheet for Patients:  PinkCheek.be Fact Sheet for Healthcare Providers:  GravelBags.it This test is not yet ap proved or cleared by the [place] FDA and  has been authorized for detection and/or diagnosis of SARS-CoV-2 by FDA under an Emergency Use Authorization (EUA). This EUA will remain  in effect (meaning this test can be used) for the duration of the COVID-19 declaration under Section 564(b)(1) of the Act, 21 U.S.C. section 360bbb-3(b)(1), unless the authorization is terminated or revoked sooner.    Influenza A by PCR NEGATIVE NEGATIVE Final   Influenza B by PCR NEGATIVE NEGATIVE Final    Comment: (NOTE) The Xpert Xpress SARS-CoV-2/FLU/RSV assay is intended as an aid in  the diagnosis of influenza from Nasopharyngeal swab specimens and  should not be used as a sole basis for treatment. Nasal washings and  aspirates are unacceptable for Xpert Xpress SARS-CoV-2/FLU/RSV  testing. Fact Sheet for Patients: PinkCheek.be Fact Sheet for Healthcare Providers: GravelBags.it This test is not yet approved or cleared by the [place] FDA and  has been authorized for detection and/or diagnosis of SARS-CoV-2 by  FDA under an Emergency Use Authorization (EUA). This EUA will remain  in effect (meaning this test can be used) for the duration of the  Covid-19 declaration under Section 564(b)(1) of the Act, 21  U.S.C. section 360bbb-3(b)(1), unless the authorization is  terminated or revoked. Performed at [facility], [address], [place], [place] [address]   MRSA PCR Screening     Status: None   Collection Time:  09/14/19  3:36 AM   Specimen: Nasal Mucosa; Nasopharyngeal  Result Value Ref Range Status   MRSA by PCR NEGATIVE NEGATIVE Final    Comment:        The GeneXpert MRSA Assay (FDA approved for NASAL specimens only), is one component of a comprehensive MRSA colonization surveillance program. It is not intended to diagnose MRSA infection nor to guide or monitor treatment for MRSA infections. Performed at [facility], [address] [place], [place] [address]   [place] prep     Status: None   Collection Time: 09/15/19 12:37 PM   Specimen: PATH GI Other  Result Value Ref Range Status   Specimen Description ESOPHAGUS  Final   Special Requests NONE  Final   KOH Prep   Final    NO YEAST OR FUNGAL ELEMENTS SEEN Performed at [facility], [address], [place], [place] [address]    Report Status 09/15/2019 FINAL  Final         Radiology Studies: No results found.      Scheduled Meds: . amiodarone  200 mg Oral Daily  . atorvastatin  80 mg Oral Daily  . Chlorhexidine Gluconate Cloth  6 each Topical Q0600  . Chlorhexidine Gluconate Cloth  6 each Topical Q0600  . clopidogrel  75 mg Oral Daily  . darbepoetin (ARANESP) injection - NON-DIALYSIS  100 mcg Subcutaneous Q Wed-1800  . doxycycline  100 mg Oral Q12H  . fentaNYL      . insulin aspart  0-9 Units Subcutaneous TID WC  . lidocaine      . midazolam      . midodrine  10 mg Oral TID WC  . ondansetron      . pantoprazole  40 mg Oral BID AC  . sevelamer carbonate  800 mg Oral TID WC  . sodium chloride flush  10-40 mL Intracatheter Q12H  . sodium chloride flush  3 mL Intravenous Q12H   Continuous Infusions: . sodium chloride Stopped (09/17/19 0900)  . sodium chloride    . sodium chloride    . albumin human 25 g (09/17/19 1615)  . sodium chloride       LOS: 4 days   Roxan Hockey, MD [facility]   If 7PM-7AM, please contact night-coverage [email or web address]  09/18/2019, 6:52 PM

## 2019-09-18 NOTE — Care Management Important Message (Signed)
Important Message  Patient Details  Name: Alexis [name] MRN: [id number] Date of Birth: Jul 26, 1945   Medicare Important Message Given:  Yes     Tommy Medal 09/18/2019, 3:11 PM

## 2019-09-18 NOTE — Op Note (Signed)
[facility] Patient Name: Alexis [name] Procedure Date: 09/18/2019 2:54 PM MRN: [id number] Date of Birth: 1945/09/10 Attending MD: Gennette Pac , MD CSN: [id number] Age: 74 Admit Type: Inpatient Procedure:                Upper GI endoscopy Indications:              Melena Providers:                Gennette Pac, MD, Nena Polio, RN, Pandora Leiter, Technician Referring MD:              Medicines:                Fentanyl 12.5 micrograms IV, Midazolam 1 mg IV,                            Ondansetron 4 mg IV Complications:            No immediate complications. Estimated Blood Loss:     Estimated blood loss was minimal. Procedure:                Pre-Anesthesia Assessment:                           - Prior to the procedure, a History and Physical                            was performed, and patient medications and                            allergies were reviewed. The patient's tolerance of                            previous anesthesia was also reviewed. The risks                            and benefits of the procedure and the sedation                            options and risks were discussed with the patient.                            All questions were answered, and informed consent                            was obtained. Prior Anticoagulants: The patient has                            taken no previous anticoagulant or antiplatelet                            agents. ASA Grade Assessment: III - A patient with  severe systemic disease. After reviewing the risks                            and benefits, the patient was deemed in                            satisfactory condition to undergo the procedure.                           After obtaining informed consent, the endoscope was                            passed under direct vision. Throughout the                            procedure, the patient's blood  pressure, pulse, and                            oxygen saturations were monitored continuously. The                            GIF-H190 ([id number]) scope was introduced through the                            mouth, and advanced to the second part of duodenum.                            The upper GI endoscopy was accomplished without                            difficulty. The patient tolerated the procedure                            well. Scope In: Scope Out: 3:34:22 PM Findings:      Distal esophageal exudate circumferentially coating the distal 3 cm of       tubular esophagus. I rubbed it away and found denuded friable bleeding       underlying mucosa. Otherwise, the tubular esophagus appeared normal.       Stomach empty. Small hiatal hernia. Slightly polypoid antral mucosa. No       ulcer or infiltrating process. No blood in the stomach. Pylorus patent       easily traversed examination of the bulb and second portion revealed no       abnormalities. I removed the scope and attached the capsule deployment       device; a patency capsule was loaded. Scope was reintroduced into the       stomach and advanced to the capsule appointment device across the       pylorus. It would not deploy. There was a malfunction. The scope and       deployment of ice was removed together it was unloaded from the scope a       new capsule in deployment device was reattached it was tested outside       the patient. Released the capsule nicely. The capsule and deployment  device was reintroduced into the esophagus and easily advanced across       the pylorus. It was then deployed uneventfully into the duodenum.       Patient tolerated the procedure well. Impression:               Distal esophagitis with overlying exudate almost                            certainly chemical induced (medication). Small                            hiatal hernia. Status post patency capsule                            placement  into the duodenum. Moderate Sedation:      Moderate (conscious) sedation was administered by the endoscopy nurse       and supervised by the endoscopist. The following parameters were       monitored: oxygen saturation, heart rate, blood pressure, respiratory       rate, EKG, adequacy of pulmonary ventilation, and response to care.       Total physician intraservice time was 19 minutes. Recommendation:           - Clear liquid diet. Flat and upright abdominal                            film at 1530 tomorrow                           - Continue present medications. Would elevate the                            bed to at least 45 degrees or higher if tolerated                            at all times. Convert as many pills/ capsules to                            the elixir form as feasible.                           -Video capsule examination of the small bowel to                            follow as feasible. I called Sena Slate at                            [phone]; rolled to voicemail immediately.                            Recording said voicemail was "full" Procedure Code(s):        --- Professional ---                           [phone], Esophagogastroduodenoscopy, flexible,  transoral; diagnostic, including collection of                            specimen(s) by brushing or washing, when performed                            (separate procedure)                           G0500, Moderate sedation services provided by the                            same physician or other qualified health care                            professional performing a gastrointestinal                            endoscopic service that sedation supports,                            requiring the presence of an independent trained                            observer to assist in the monitoring of the                            patient's level of consciousness and physiological                             status; initial 15 minutes of intra-service time;                            patient age 77 years or older (additional time may                            be reported with [phone], as appropriate) Diagnosis Code(s):        --- Professional ---                           K92.1, Melena (includes Hematochezia) CPT copyright 2019 [employer]. All rights reserved. The codes documented in this report are preliminary and upon coder review may  be revised to meet current compliance requirements. Alexis [name]. Alexis Vayda, MD Gennette Pac, MD 09/18/2019 3:49:21 PM This report has been signed electronically. Number of Addenda: 0

## 2019-09-19 ENCOUNTER — Inpatient Hospital Stay (HOSPITAL_COMMUNITY): Payer: Medicare Other

## 2019-09-19 ENCOUNTER — Inpatient Hospital Stay (HOSPITAL_COMMUNITY): Payer: Medicare Other | Admitting: Anesthesiology

## 2019-09-19 ENCOUNTER — Encounter (HOSPITAL_COMMUNITY)
Admission: EM | Disposition: A | Discharge status: Skilled Nursing Facility | Payer: Self-pay | Attending: Family Medicine

## 2019-09-19 DIAGNOSIS — K921 Melena: Secondary | ICD-10-CM | POA: Diagnosis not present

## 2019-09-19 LAB — RENAL FUNCTION PANEL
Albumin: 3.2 g/dL — ABNORMAL LOW (ref 3.5–5.0)
Anion gap: 13 (ref 5–15)
BUN: 24 mg/dL — ABNORMAL HIGH (ref 8–23)
CO2: 27 mmol/L (ref 22–32)
Calcium: 8.9 mg/dL (ref 8.9–10.3)
Chloride: 97 mmol/L — ABNORMAL LOW (ref 98–111)
Creatinine, Ser: 6.92 mg/dL — ABNORMAL HIGH (ref 0.44–1.00)
GFR calc Af Amer: 6 mL/min — ABNORMAL LOW (ref >60)
GFR calc non Af Amer: 5 mL/min — ABNORMAL LOW (ref >60)
Glucose, Bld: 96 mg/dL (ref 70–99)
Phosphorus: 4.6 mg/dL (ref 2.5–4.6)
Potassium: 3.6 mmol/L (ref 3.5–5.1)
Sodium: 137 mmol/L (ref 135–145)

## 2019-09-19 LAB — GLUCOSE, CAPILLARY
Glucose-Capillary: 77 mg/dL (ref 70–99)
Glucose-Capillary: 81 mg/dL (ref 70–99)
Glucose-Capillary: 74 mg/dL (ref 70–99)
Glucose-Capillary: 73 mg/dL (ref 70–99)

## 2019-09-19 SURGERY — ESOPHAGOGASTRODUODENOSCOPY (EGD) WITH PROPOFOL
Anesthesia: Choice

## 2019-09-19 MED ORDER — METOCLOPRAMIDE HCL 5 MG/ML IJ SOLN
10.0000 mg | Freq: Once | INTRAMUSCULAR | Status: AC
  Administered 2019-09-19: 10 mg via INTRAVENOUS
  Filled 2019-09-19: qty 2

## 2019-09-19 MED ORDER — METOCLOPRAMIDE HCL 5 MG/ML IJ SOLN: 10.0000 mg | Freq: Once | INTRAMUSCULAR | Status: DC

## 2019-09-19 MED ORDER — DARBEPOETIN ALFA 100 MCG/0.5ML IJ SOSY
100.0000 ug | PREFILLED_SYRINGE | INTRAMUSCULAR | Status: DC
  Filled 2019-09-19: qty 0.5

## 2019-09-19 NOTE — H&P (View-Only) (Signed)
Subjective:  Resting comfortably. Knows she is in the hospital. States she doesn't care what month it is. Feels hungry. Denies abdominal pain.   Objective: Vital signs in last 24 hours: Temp:  [98.2 F (36.8 C)-99.1 F (37.3 C)] 98.2 F (36.8 C) (05/06 0505) Pulse Rate:  [72-115] 72 (05/06 0505) Resp:  [16-29] 16 (05/06 0505) BP: (98-154)/(48-95) 98/48 (05/06 0505) SpO2:  [75 %-100 %] 100 % (05/06 0505) Weight:  [86.8 kg] 86.8 kg (05/05 1448) Last BM Date: 09/18/19 General:   Alert,  Limited conversation. Will answer simple questions with one word answers. Cooperative and NAD Head:  Normocephalic and atraumatic. Eyes:  Sclera clear, no icterus.  Abdomen:  Soft, nontender and nondistended.  Normal bowel sounds, without guarding, and without rebound.   Extremities:  Left AKA. Psych: flat affect.  Intake/Output from previous day: 05/05 0701 - 05/06 0700 In: 100 [I.V.:100] Out: -  Intake/Output this shift: No intake/output data recorded.  Lab Results: CBC Recent Labs    09/17/19 1221 09/18/19 0430  WBC 7.0 7.0  HGB 8.7* 8.5*  HCT 27.4* 26.5*  MCV 82.0 81.3  PLT 168 136*   BMET Recent Labs    09/18/19 0430  NA 135  K 3.6  CL 98  CO2 29  GLUCOSE 145*  BUN 16  CREATININE 4.55*  CALCIUM 8.5*   LFTs Recent Labs    09/18/19 0430  ALBUMIN 3.3*   No results for input(s): LIPASE in the last 72 hours. PT/INR No results for input(s): LABPROT, INR in the last 72 hours.    Imaging Studies: MR FOOT RIGHT WO CONTRAST  Result Date: 08/26/2019 CLINICAL DATA:  Right heel ulcer. Prior fifth toe amputation 4 osteomyelitis. EXAM: MRI OF THE RIGHT FOOT WITHOUT CONTRAST TECHNIQUE: Multiplanar, multisequence MR imaging of the foot was performed. No intravenous contrast was administered. Osteomyelitis protocol MRI of the foot was obtained, to include the entire foot and ankle. This protocol uses a large field of view to cover the entire foot and ankle, and is suitable for  assessing bony structures for osteomyelitis. Due to the large field of view and imaging plane choice, this protocol is less sensitive for assessing small structures such as ligamentous structures of the foot and ankle, compared to a dedicated forefoot or dedicated hindfoot exam. COMPARISON:  Right foot and ankle x-rays dated August 19, 2019. FINDINGS: Bones/Joint/Cartilage No suspicious marrow signal abnormality. Prior amputation of the fifth toe and metatarsal head. No fracture or dislocation. Advanced degenerative changes of the ankle and midfoot with midfoot collapse. Small tibiotalar joint effusion. Ligaments Collateral ligaments are intact. Muscles and Tendons Flexor, peroneal and extensor compartment tendons are intact. Achilles tendon is intact with mild distal tendinosis. Complete fatty atrophy of the intrinsic foot muscles. Soft tissue Focal susceptibility artifact within the plantar soft tissues at the level of the proximal fourth metatarsal, corresponding to the thin linear foreign body seen on recent x-ray. Additional small area of susceptibility artifact in the plantar soft tissues at the level of the second intermetatarsal space without evident foreign body seen on recent x-ray. No fluid collection or hematoma. No soft tissue mass. IMPRESSION: 1. No evidence of osteomyelitis. 2. Focal susceptibility artifact within the plantar soft tissues at the level of the proximal fourth metatarsal, corresponding to a thin linear foreign body seen on recent x-ray. 3. 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 Electronically Signed   By: Titus Dubin M.D.   On: 08/26/2019 17:29   PERIPHERAL VASCULAR CATHETERIZATION  Result Date: 08/22/2019 Patient name: KADASIA KASSING MRN: [id number] DOB: [birth year] Sex: female 08/22/2019 Pre-operative Diagnosis: Bilateral lower extremity ulcer Post-operative diagnosis:  Same Surgeon:  [name] Procedure Performed:  1.  Ultrasound-guided access, right femoral  artery  2.  Ultrasound-guided access, left femoral artery  3.  Abdominal aortogram  4.  Bilateral lower extremity runoff  5.  Intravascular lithotripsy, right superficial femoral and popliteal artery  6.  Drug-eluting stent, right superficial femoral-popliteal artery  7.  Conscious sedation, 143 minutes Indications: The patient has extensive bilateral lower extremity ulcers.  She is here today for limb salvage. Procedure:  The patient was identified in the holding area and taken to room 8.  The patient was then placed supine on the table and prepped and draped in the usual sterile fashion.  A time out was called.  Conscious sedation was administered with the use of IV fentanyl and Versed under continuous physician and nurse monitoring.  Heart rate, blood pressure, and oxygen saturation were continuously monitored.  Total sedation time was 143 minutes.  I initially tried to get access into the left common femoral artery, however I was unsuccessful therefore I turned my attention towards the right femoral artery.  Ultrasound was used to evaluate the right common femoral artery.  It was patent .  A digital ultrasound image was acquired.  A micropuncture needle was used to access the right common femoral artery under ultrasound guidance.  An 018 wire was advanced without resistance and a micropuncture sheath was placed.  The 018 wire was removed and a benson wire was placed.  The micropuncture sheath was exchanged for a 5 french sheath.  An omniflush catheter was advanced over the wire to the level of L-1.  An abdominal angiogram was obtained.  Next, the cath was pulled out of the aortic bifurcation and bilateral runoff was performed.  I then followed up for the intervention by gaining access under ultrasound guidance of the left common femoral artery. Findings:  Aortogram: No significant infrarenal aortic stenosis.  Bilateral common and external iliac arteries are patent throughout the course however they are heavily  calcified.   Right Lower Extremity:  The right common femoral and profundofemoral artery are heavily calcified but patent throughout their course.  The superficial femoral artery is diffusely diseased with multiple high-grade lesions down to the adductor canal where it occludes.  There is reconstitution of the popliteal artery just below the joint space.  There is two-vessel runoff via the peroneal and posterior tibial artery.  Left Lower Extremity: The common femoral and profundofemoral artery are patent without stenosis but heavily calcified.  The superficial femoral artery is occluded with reconstitution of the superficial femoral artery at the adductor canal with diffusely diseased three-vessel runoff and minimal opacification of the digital arteries. Intervention: After the above images were acquired the decision made to proceed with intervention.  The sheath in the right groin was removed.  I tried to use a minx however this failed because the balloon popped.  Manual pressure was held for 10 minutes.  I then placed a 7 x 45 sheath from the left groin into the right external iliac artery and fully heparinized the patient.  Using a quick cross catheter and 035 Glidewire followed by a Glidewire advantage I was able to get across the stenosis.  The lesion was predilated with a 4 x 100 balloon.  I then performed shockwave intravascular ultrasound of the popliteal artery just distal to the joint space up to  its origin.  A 5 x 60 balloon was used for the distal half and a 6 x 60 balloon was used for the proximal half.  Total treatment time was 510 seconds.  I then elected to stent the treated area.  A 5 x 100 Tigris stent was placed distally followed by Elluvia 6 x 120, 7 x 120, and 7 x 40.  The stents were then molded with a 5 mm balloon and completion imaging showed inline flow down across the foot.  Catheters and wires were removed.  A short 7 French sheath was placed.  Patient tolerated the procedure well there  were no complications. 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  Successfully recanalized and treated with shockwave intravascular lithotripsy followed by drug-eluting stent placement from just below the joint space in the popliteal artery up to the origin of the superficial femoral artery.  There is two-vessel runoff via the peroneal and posterior tibial artery.  #2  Left superficial femoral artery occlusion  V. [name], M.D., [facility] [place] Office: [phone] Pager:  [phone]  VAS [place] ABI WITH/WO TBI  Result Date: 08/21/2019 LOWER EXTREMITY DOPPLER STUDY Indications: Gangrene, and peripheral artery disease. High Risk Factors: Diabetes.  Performing Technologist: June Leap Rvt, [employer]  Examination Guidelines: A complete evaluation includes at minimum, Doppler waveform signals and systolic blood pressure reading at the level of bilateral brachial, anterior tibial, and posterior tibial arteries, when vessel segments are accessible. Bilateral testing is considered an integral part of a complete examination. Photoelectric Plethysmograph (PPG) waveforms and toe systolic pressure readings are included as required and additional duplex testing as needed. Limited examinations for reoccurring indications may be performed as noted.  ABI Findings: +---------+-----------------+-----+------------------+-------------------------+ Right    Rt Pressure      IndexWaveform          Comment                            (mmHg)                                                            +---------+-----------------+-----+------------------+-------------------------+ Brachial                                         BP not taken-restricted                                                    arm                       +---------+-----------------+-----+------------------+-------------------------+ ATA      206               1.02 dampened  monophasic                                  +---------+-----------------+-----+------------------+-------------------------+ PTA      147              0.73 dampened                                                                   monophasic                                  +---------+-----------------+-----+------------------+-------------------------+ Great Toe                      Abnormal                                    +---------+-----------------+-----+------------------+-------------------------+ +---------+------------------+-----+-------------------+-------+ Left     Lt Pressure (mmHg)IndexWaveform           Comment +---------+------------------+-----+-------------------+-------+ Brachial 201                    triphasic                  +---------+------------------+-----+-------------------+-------+ ATA      97                0.48 dampened monophasic        +---------+------------------+-----+-------------------+-------+ PTA      19                0.09 dampened monophasic        +---------+------------------+-----+-------------------+-------+ Great Toe                       Abnormal                   +---------+------------------+-----+-------------------+-------+ Great toe PPG waveform too dampened to obtain pressures bilaterally.  Summary: Right: ABI is within normal range by ATA pressure, however abnormal pedal artery waveforms suggest this is falsely elevated. ABI is in the moderate range based on PTA pressure. Severely dampened PPG waveform of Great toe. Left: Resting left ankle-brachial index indicates severe left lower extremity arterial disease. Severely dampened PPG waveform of Great toe.  *See table(s) above for measurements and observations.  Electronically signed by Servando Snare MD on 08/21/2019 at 5:13:35 PM.    Final   [2  weeks]   Assessment: Pleasant 74 year old female admitted with hypoglycemia, found to have hypotension with heme positive stool/melena in the setting of chronic Eliquis, Plavix, and aspirin.  Hemoglobin without significant drop in your baseline.  No transfusion needed.  Clinical course complicated by recent DES placed in the right superficial femoral-popliteal artery on August 22, 2019, on Plavix and aspirin, along with Eliquis with high risk of stroke/TIA (CHADVASc score of 7, stroke in January 2021).  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  Currently aspirin and Eliquis both on hold, with Eliquis held since admission.  Difficult situation with risk of stroke high need for anticoagulation, with  Eliquis on hold until GI evaluation complete.  Heme positive stool: EGD completed this admission with mucosal coating of distal esophagus but negative for Candida, most likely pill related, single gastric polyp, erythematous antrum, single nonbleeding angiodysplastic lesion in the duodenum.  Outside colonoscopy normal in 2016.  Capsule study advised but needed agile first.  Patient was unable to swallow the agile capsule, it was placed via EGD yesterday.  Follow-up imaging due this afternoon to verify capsule passed.  At that point plans for EGD with capsule placement as feasible.  Anemia of chronic disease: End-stage renal disease on dialysis.  Hemoglobin 12 range in December 2020.  8-9 range over the past few months.  Hemoglobin yesterday stable at 8.5.    Pill induced esophagitis: discussed with attending. Consider converting medication over to elixir if possible. She has been on doxycycline tablets since prior to admission which can cause significant pill-induced esophagitis. Recommend sitting upright for minimum of 30 minutes after taking medicaiton and for 2 hours after eating. Will consider adding short course of carafate after capsule study.    Plan: 1. F/u abd film  today as scheduled.  2. Convert medications to liquid if possible, especially doxycyline.   Laureen Ochs. Bernarda Caffey [facility] [phone] 5/6/20219:13 AM    Laureen Ochs. Bernarda Caffey [facility] [phone] 5/6/20219:08 AM     LOS: 5 days

## 2019-09-19 NOTE — Progress Notes (Signed)
PROGRESS NOTE    Alexis [name]  TDV:[id number] DOB: 09/01/1945 DOA: 09/12/2019 PCP: System, Provider Not In    Brief Narrative:  74 year old female with a history of atrial fibrillation on anticoagulation, end-stage renal disease, coronary artery disease, peripheral vascular disease, diabetes, was recently discharged from the hospital on 08/31/19 after she underwent left above-the-knee amputation and was started on dual antiplatelet therapy for drug-eluting stent placed in her right lower extremity.  She was admitted to the hospital with severe hypoglycemia.  She was cool and clammy and hemodialysis.  Further work-up showed that she was hypotensive and guaiac positive.  She was admitted for further work-up of GI bleeding.  Assessment & Plan:   Active Problems:   Anemia in chronic kidney disease (CKD)   Type II diabetes mellitus with manifestations (HCC)   ESRD on dialysis [facility])   Atrial fibrillation with rapid ventricular response (HCC)   Osteomyelitis (HCC)   UGI bleed   Melena   Hx of AKA (above knee amputation), left (HCC)   Pressure injury of skin   Hypotension   Hypoglycemia   Anxiety state   Palliative care by specialist   Goals of care, counseling/discussion   1. GI bleed.  Patient had guaiac positive stools.  She is on chronic Eliquis and dual antiplatelet therapy with aspirin and Plavix.  Eliquis currently on hold.    She was seen by gastroenterology and underwent EGD on 5/2 that did not show any signs of active bleeding.  Patient was unable to swallow agile capsule today.  Dr. Gala Romney placed patency agile capsule on 5/5 endoscopically, plan on EGD with Givens capsule placement on 09/20/2019 if KUB shows patency capsule passed.  2. Anemia of chronic kidney disease.  Hemoglobin noted she is not had a significant decline due to GI bleeding.  Continue to monitor. 3. Altered mental status.  Secondary to hypoglycemia.  She received D50.  Overall blood sugars have improved.  Mental  status is also improved. 4. Atrial fibrillation without ventricular response.  Chronically on amiodarone.  Eliquis on hold due to GI bleeding.  She was treated with amiodarone infusion.  Heart rates are now improved and she is back on oral amiodarone.  Discussed with cardiology, Dr. Domenic Polite regarding risks and benefits of continuing Eliquis.  Currently, her CHADSVASc score is 7, putting her at a 15.7% risk of stroke/TIA.  She recently had a stroke and 05/2019, making her high risk for recurrence. 5. End-stage renal disease on hemodialysis, Tuesday, Thursday, Saturday.  Nephrology following. 6. Hypotension.  Patient has chronic hypotension and is on midodrine.  Midodrine dose was increased to 10 mg 3 times daily.  Overall blood pressures are improved.. 7. Right lower extremity chronic osteomyelitis.  She is on chronic doxycycline with a stop date of 09/18/2019.  Follow-up with orthopedics. 8. Diabetes.  Started on sliding scale insulin.  Blood sugars have been stable. 9. Peripheral vascular disease.  Recently had drug-eluting stent placed in the right superficial femoral-popliteal artery on 08/22/2019.  She is on dual antiplatelet therapy.  Discussed with Dr. Doren Custard on-call for vascular surgery who agreed with stopping aspirin for now, but requested the Plavix be continued due to recent stent. 10. Anticoagulation---.  This is a complicated situation.  Patient will be at risk for bleeding with anticoagulation, although her risk of stroke is also very high.  Palliative care has initiated discussions regarding goals of care, patient/family wish to continue full scope of care at this time.  May need to try the  patient back on Eliquis once GI work-up is complete and monitor for any bleeding recurrence. 11)Distal esophagitis with overlying exudate almost certainly chemical induced (medication)--- liquid diet and Convert as many pills/ capsules to the elixir form as feasible.  12) dementia--- patient with underlying  memory and cognitive deficits  DVT prophylaxis: SCDs Code Status: Full code Family Communication: Try to contact daughter (POA), no answer, unable to leave voicemail Disposition Plan: Status is: Inpatient  Remains inpatient appropriate because:Inpatient level of care appropriate due to severity of illness.  -Not tolerating oral intake well, repeat EGD with agile patency capsule placement on 09/18/2019  Dispo: The patient is from: SNF              Anticipated d/c is to: Home with home health              Anticipated d/c date is: In 1 to 2 days pending GI work-up--- and if tolerating oral intake well              Barrier--Patient currently is not medically stable to d/c.  Consultants:   Gastroenterology  Nephrology  General surgery for central line placement  Palliative care  Procedures:   5/1 right femoral vein central line, removed on 5/4 5/2 EGD: Normal hypopharynx.                           - Normal proximal esophagus and mid esophagus.                           - Mucosal coating at distal esophagus. ? candida                            esophagits. ? dissolved pill. Cells for cytology                            obtained.                           - Z-line irregular, 39 cm from the incisors.                           - A single gastric polyp.                           - Erythematous mucosa in the antrum.                           - Normal duodenal bulb.                           - A single non-bleeding angiodysplastic lesion in                            the duodenum  -09/18/19-EGD with agile patency capsule placement  Antimicrobials:       Subjective: Unhappy about NPO status - plan on EGD with Givens capsule placement this afternoon if KUB shows patency capsule passed.  -Tolerated hemodialysis well on 09/19/2018  Objective: Vitals:   09/19/19 1200 09/19/19 1230 09/19/19 1252 09/19/19 1253  BP: (!) 103/38 (!) 94/48 111/60 110/60  Pulse: 71 70 73 72  Resp: 18 18 20  20   Temp:   99.2 F (37.3 C) 99.2 F (37.3 C)  TempSrc:      SpO2:   100% 100%  Weight:    88 kg  Height:        Intake/Output Summary (Last 24 hours) at 09/19/2019 1802 Last data filed at 09/19/2019 1253 Gross per 24 hour  Intake --  Output 1000 ml  Net -1000 ml   Filed Weights   09/18/19 1448 09/19/19 0933 09/19/19 1253  Weight: 86.8 kg 89 kg 88 kg    Examination:  General exam: Alert, awake, in no acute distress respiratory system: Clear to auscultation. Respiratory effort normal. Cardiovascular system: Irregular. No murmurs, rubs, gallops. Gastrointestinal system: Abdomen is nondistended, soft and nontender. No organomegaly or masses felt. Normal bowel sounds heard. Central nervous system: Alert and oriented.  Generalized weakness, no focal neurological deficits. Extremities: Left above-the-knee amputation--- staples intact Skin: No rashes, lesions or ulcers Psychiatry: Baseline Memory and cognitive deficits MSK=-right forearm AV fistula with positive thrill and bruit  Data Reviewed: I have personally reviewed following labs and imaging studies  CBC: Recent Labs  Lab 09/12/19 2026 09/13/19 0626 09/14/19 0454 09/15/19 0347 09/16/19 0405 09/17/19 1221 09/18/19 0430  WBC 6.5   < > 6.9 6.4 7.2 7.0 7.0  NEUTROABS 5.2  --   --   --   --   --   --   HGB 9.9*   < > 9.0* 8.9* 8.5* 8.7* 8.5*  HCT 31.0*   < > 29.3* 28.5* 26.8* 27.4* 26.5*  MCV 81.4   < > 83.2 81.7 81.7 82.0 81.3  PLT 238   < > 212 176 185 168 136*   < > = values in this interval not displayed.   Basic Metabolic Panel: Recent Labs  Lab 09/14/19 0454 09/15/19 0347 09/16/19 0405 09/18/19 0430 09/19/19 0856  NA 134* 134* 135 135 137  K 4.4 3.2* 3.3* 3.6 3.6  CL 94* 95* 95* 98 97*  CO2 23 26 27 29 27   GLUCOSE 155* 80 117* 145* 96  BUN 28* 13 21 16  24*  CREATININE 6.72* 3.82* 5.88* 4.55* 6.92*  CALCIUM 9.0 8.6* 8.8* 8.5* 8.9  PHOS  --   --  4.0 2.5 4.6   GFR: Estimated Creatinine Clearance: 8.1  mL/min (A) (by C-G formula based on SCr of 6.92 mg/dL (H)). Liver Function Tests: Recent Labs  Lab 09/12/19 2026 09/13/19 0626 09/16/19 0405 09/18/19 0430 09/19/19 0856  AST 24 19  --   --   --   ALT 9 8  --   --   --   ALKPHOS 51 47  --   --   --   BILITOT 0.5 0.9  --   --   --   PROT 8.2* 7.3  --   --   --   ALBUMIN 2.9* 3.0* 2.8* 3.3* 3.2*   No results for input(s): LIPASE, AMYLASE in the last 168 hours. No results for input(s): AMMONIA in the last 168 hours. Coagulation Profile: No results for input(s): INR, PROTIME in the last 168 hours. Cardiac Enzymes: No results for input(s): CKTOTAL, CKMB, CKMBINDEX, TROPONINI in the last 168 hours. BNP (last 3 results) No results for input(s): PROBNP in the last 8760 hours. HbA1C: No results for input(s): HGBA1C in the last 72 hours. CBG: Recent Labs  Lab 09/18/19 2105 09/18/19 2229 09/19/19 0759 09/19/19 1151 09/19/19 1712  GLUCAP 72 114*  77 81 74   Lipid Profile: No results for input(s): CHOL, HDL, LDLCALC, TRIG, CHOLHDL, LDLDIRECT in the last 72 hours. Thyroid Function Tests: No results for input(s): TSH, T4TOTAL, FREET4, T3FREE, THYROIDAB in the last 72 hours. Anemia Panel: No results for input(s): VITAMINB12, FOLATE, FERRITIN, TIBC, IRON, RETICCTPCT in the last 72 hours. Sepsis Labs: No results for input(s): PROCALCITON, LATICACIDVEN in the last 168 hours.  Recent Results (from the past 240 hour(s))  Respiratory Panel by RT PCR (Flu A&B, Covid) - Nasopharyngeal Swab     Status: None   Collection Time: 09/12/19 10:19 PM   Specimen: Nasopharyngeal Swab  Result Value Ref Range Status   SARS Coronavirus 2 by RT PCR NEGATIVE NEGATIVE Final    Comment: (NOTE) SARS-CoV-2 target nucleic acids are NOT DETECTED. The SARS-CoV-2 RNA is generally detectable in upper respiratoy specimens during the acute phase of infection. The lowest concentration of SARS-CoV-2 viral copies this assay can detect is 131 copies/mL. A negative  result does not preclude SARS-Cov-2 infection and should not be used as the sole basis for treatment or other patient management decisions. A negative result may occur with  improper specimen collection/handling, submission of specimen other than nasopharyngeal swab, presence of viral mutation(s) within the areas targeted by this assay, and inadequate number of viral copies (<131 copies/mL). A negative result must be combined with clinical observations, patient history, and epidemiological information. The expected result is Negative. Fact Sheet for Patients:  PinkCheek.be Fact Sheet for Healthcare Providers:  GravelBags.it This test is not yet ap proved or cleared by the [place] FDA and  has been authorized for detection and/or diagnosis of SARS-CoV-2 by FDA under an Emergency Use Authorization (EUA). This EUA will remain  in effect (meaning this test can be used) for the duration of the COVID-19 declaration under Section 564(b)(1) of the Act, 21 U.S.C. section 360bbb-3(b)(1), unless the authorization is terminated or revoked sooner.    Influenza A by PCR NEGATIVE NEGATIVE Final   Influenza B by PCR NEGATIVE NEGATIVE Final    Comment: (NOTE) The Xpert Xpress SARS-CoV-2/FLU/RSV assay is intended as an aid in  the diagnosis of influenza from Nasopharyngeal swab specimens and  should not be used as a sole basis for treatment. Nasal washings and  aspirates are unacceptable for Xpert Xpress SARS-CoV-2/FLU/RSV  testing. Fact Sheet for Patients: PinkCheek.be Fact Sheet for Healthcare Providers: GravelBags.it This test is not yet approved or cleared by the [place] FDA and  has been authorized for detection and/or diagnosis of SARS-CoV-2 by  FDA under an Emergency Use Authorization (EUA). This EUA will remain  in effect (meaning this test can be used) for the  duration of the  Covid-19 declaration under Section 564(b)(1) of the Act, 21  U.S.C. section 360bbb-3(b)(1), unless the authorization is  terminated or revoked. Performed at [facility], [address], [place], [place] [address]   MRSA PCR Screening     Status: None   Collection Time: 09/14/19  3:36 AM   Specimen: Nasal Mucosa; Nasopharyngeal  Result Value Ref Range Status   MRSA by PCR NEGATIVE NEGATIVE Final    Comment:        The GeneXpert MRSA Assay (FDA approved for NASAL specimens only), is one component of a comprehensive MRSA colonization surveillance program. It is not intended to diagnose MRSA infection nor to guide or monitor treatment for MRSA infections. Performed at [facility], [address], [place], [place] [address]   [place] prep  Status: None   Collection Time: 09/15/19 12:37 PM   Specimen: PATH GI Other  Result Value Ref Range Status   Specimen Description ESOPHAGUS  Final   Special Requests NONE  Final   KOH Prep   Final    NO YEAST OR FUNGAL ELEMENTS SEEN Performed at [facility], [address] [place], [place] [address]    Report Status 09/15/2019 FINAL  Final         Radiology Studies: DG Abd 1 View  Result Date: 09/19/2019 CLINICAL DATA:  Endoscopy capsule. EXAM: ABDOMEN - 1 VIEW COMPARISON:  August 18, 2019. FINDINGS: The bowel gas pattern is normal. Endoscopy capsule is seen just to the right of the lower lumbar spine. No radio-opaque calculi or other significant radiographic abnormality are seen. IMPRESSION: Endoscopy capsule is seen just to the right of the lower lumbar spine. No evidence of bowel obstruction or ileus. Electronically Signed   By: Marijo Conception M.D.   On: 09/19/2019 15:36        Scheduled Meds: . amiodarone  200 mg Oral Daily  . atorvastatin  80 mg Oral Daily  . Chlorhexidine Gluconate Cloth  6 each Topical Q0600  . Chlorhexidine Gluconate Cloth  6 each Topical Q0600  . clopidogrel  75 mg Oral Daily  . darbepoetin  (ARANESP) injection - NON-DIALYSIS  100 mcg Subcutaneous Q Thu-1800  . doxycycline  100 mg Oral Q12H  . insulin aspart  0-9 Units Subcutaneous TID WC  . metoCLOPramide (REGLAN) injection  10 mg Intravenous Once  . midodrine  10 mg Oral TID WC  . pantoprazole  40 mg Oral BID AC  . sevelamer carbonate  800 mg Oral TID WC  . sodium chloride flush  10-40 mL Intracatheter Q12H  . sodium chloride flush  3 mL Intravenous Q12H   Continuous Infusions: . sodium chloride Stopped (09/17/19 0900)  . sodium chloride    . sodium chloride    . albumin human 25 g (09/19/19 0947)  . sodium chloride       LOS: 5 days   Roxan Hockey, MD [facility]   If 7PM-7AM, please contact night-coverage [email or web address]  09/19/2019, 6:02 PM

## 2019-09-19 NOTE — Plan of Care (Signed)
  Problem: Education: Goal: Knowledge of General Education information will improve Description: Including pain rating scale, medication(s)/side effects and non-pharmacologic comfort measures Outcome: Adequate for Discharge   Problem: Safety: Goal: Ability to remain free from injury will improve Outcome: Adequate for Discharge   Problem: Skin Integrity: Goal: Risk for impaired skin integrity will decrease Outcome: Adequate for Discharge

## 2019-09-19 NOTE — Progress Notes (Addendum)
Subjective:  Resting comfortably. Knows she is in the hospital. States she doesn't care what month it is. Feels hungry. Denies abdominal pain.   Objective: Vital signs in last 24 hours: Temp:  [98.2 F (36.8 C)-99.1 F (37.3 C)] 98.2 F (36.8 C) (05/06 0505) Pulse Rate:  [72-115] 72 (05/06 0505) Resp:  [16-29] 16 (05/06 0505) BP: (98-154)/(48-95) 98/48 (05/06 0505) SpO2:  [75 %-100 %] 100 % (05/06 0505) Weight:  [86.8 kg] 86.8 kg (05/05 1448) Last BM Date: 09/18/19 General:   Alert,  Limited conversation. Will answer simple questions with one word answers. Cooperative and NAD Head:  Normocephalic and atraumatic. Eyes:  Sclera clear, no icterus.  Abdomen:  Soft, nontender and nondistended.  Normal bowel sounds, without guarding, and without rebound.   Extremities:  Left AKA. Psych: flat affect.  Intake/Output from previous day: 05/05 0701 - 05/06 0700 In: 100 [I.V.:100] Out: -  Intake/Output this shift: No intake/output data recorded.  Lab Results: CBC Recent Labs    09/17/19 1221 09/18/19 0430  WBC 7.0 7.0  HGB 8.7* 8.5*  HCT 27.4* 26.5*  MCV 82.0 81.3  PLT 168 136*   BMET Recent Labs    09/18/19 0430  NA 135  K 3.6  CL 98  CO2 29  GLUCOSE 145*  BUN 16  CREATININE 4.55*  CALCIUM 8.5*   LFTs Recent Labs    09/18/19 0430  ALBUMIN 3.3*   No results for input(s): LIPASE in the last 72 hours. PT/INR No results for input(s): LABPROT, INR in the last 72 hours.    Imaging Studies: MR FOOT RIGHT WO CONTRAST  Result Date: 08/26/2019 CLINICAL DATA:  Right heel ulcer. Prior fifth toe amputation 4 osteomyelitis. EXAM: MRI OF THE RIGHT FOOT WITHOUT CONTRAST TECHNIQUE: Multiplanar, multisequence MR imaging of the foot was performed. No intravenous contrast was administered. Osteomyelitis protocol MRI of the foot was obtained, to include the entire foot and ankle. This protocol uses a large field of view to cover the entire foot and ankle, and is suitable for  assessing bony structures for osteomyelitis. Due to the large field of view and imaging plane choice, this protocol is less sensitive for assessing small structures such as ligamentous structures of the foot and ankle, compared to a dedicated forefoot or dedicated hindfoot exam. COMPARISON:  Right foot and ankle x-rays dated August 19, 2019. FINDINGS: Bones/Joint/Cartilage No suspicious marrow signal abnormality. Prior amputation of the fifth toe and metatarsal head. No fracture or dislocation. Advanced degenerative changes of the ankle and midfoot with midfoot collapse. Small tibiotalar joint effusion. Ligaments Collateral ligaments are intact. Muscles and Tendons Flexor, peroneal and extensor compartment tendons are intact. Achilles tendon is intact with mild distal tendinosis. Complete fatty atrophy of the intrinsic foot muscles. Soft tissue Focal susceptibility artifact within the plantar soft tissues at the level of the proximal fourth metatarsal, corresponding to the thin linear foreign body seen on recent x-ray. Additional small area of susceptibility artifact in the plantar soft tissues at the level of the second intermetatarsal space without evident foreign body seen on recent x-ray. No fluid collection or hematoma. No soft tissue mass. IMPRESSION: 1. No evidence of osteomyelitis. 2. Focal susceptibility artifact within the plantar soft tissues at the level of the proximal fourth metatarsal, corresponding to a thin linear foreign body seen on recent x-ray. 3. 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 Electronically Signed   By: Titus Dubin M.D.   On: 08/26/2019 17:29   PERIPHERAL VASCULAR CATHETERIZATION  Result Date: 08/22/2019 Patient name: ELLEY HARP MRN: [id number] DOB: [id number] Sex: female 08/22/2019 Pre-operative Diagnosis: Bilateral lower extremity ulcer Post-operative diagnosis:  Same Surgeon:  [name] Procedure Performed:  1.  Ultrasound-guided access, right femoral  artery  2.  Ultrasound-guided access, left femoral artery  3.  Abdominal aortogram  4.  Bilateral lower extremity runoff  5.  Intravascular lithotripsy, right superficial femoral and popliteal artery  6.  Drug-eluting stent, right superficial femoral-popliteal artery  7.  Conscious sedation, 143 minutes Indications: The patient has extensive bilateral lower extremity ulcers.  She is here today for limb salvage. Procedure:  The patient was identified in the holding area and taken to room 8.  The patient was then placed supine on the table and prepped and draped in the usual sterile fashion.  A time out was called.  Conscious sedation was administered with the use of IV fentanyl and Versed under continuous physician and nurse monitoring.  Heart rate, blood pressure, and oxygen saturation were continuously monitored.  Total sedation time was 143 minutes.  I initially tried to get access into the left common femoral artery, however I was unsuccessful therefore I turned my attention towards the right femoral artery.  Ultrasound was used to evaluate the right common femoral artery.  It was patent .  A digital ultrasound image was acquired.  A micropuncture needle was used to access the right common femoral artery under ultrasound guidance.  An 018 wire was advanced without resistance and a micropuncture sheath was placed.  The 018 wire was removed and a benson wire was placed.  The micropuncture sheath was exchanged for a 5 french sheath.  An omniflush catheter was advanced over the wire to the level of L-1.  An abdominal angiogram was obtained.  Next, the cath was pulled out of the aortic bifurcation and bilateral runoff was performed.  I then followed up for the intervention by gaining access under ultrasound guidance of the left common femoral artery. Findings:  Aortogram: No significant infrarenal aortic stenosis.  Bilateral common and external iliac arteries are patent throughout the course however they are heavily  calcified.   Right Lower Extremity:  The right common femoral and profundofemoral artery are heavily calcified but patent throughout their course.  The superficial femoral artery is diffusely diseased with multiple high-grade lesions down to the adductor canal where it occludes.  There is reconstitution of the popliteal artery just below the joint space.  There is two-vessel runoff via the peroneal and posterior tibial artery.  Left Lower Extremity: The common femoral and profundofemoral artery are patent without stenosis but heavily calcified.  The superficial femoral artery is occluded with reconstitution of the superficial femoral artery at the adductor canal with diffusely diseased three-vessel runoff and minimal opacification of the digital arteries. Intervention: After the above images were acquired the decision made to proceed with intervention.  The sheath in the right groin was removed.  I tried to use a minx however this failed because the balloon popped.  Manual pressure was held for 10 minutes.  I then placed a 7 x 45 sheath from the left groin into the right external iliac artery and fully heparinized the patient.  Using a quick cross catheter and 035 Glidewire followed by a Glidewire advantage I was able to get across the stenosis.  The lesion was predilated with a 4 x 100 balloon.  I then performed shockwave intravascular ultrasound of the popliteal artery just distal to the joint space up to  its origin.  A 5 x 60 balloon was used for the distal half and a 6 x 60 balloon was used for the proximal half.  Total treatment time was 510 seconds.  I then elected to stent the treated area.  A 5 x 100 Tigris stent was placed distally followed by Elluvia 6 x 120, 7 x 120, and 7 x 40.  The stents were then molded with a 5 mm balloon and completion imaging showed inline flow down across the foot.  Catheters and wires were removed.  A short 7 French sheath was placed.  Patient tolerated the procedure well there  were no complications. 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  Successfully recanalized and treated with shockwave intravascular lithotripsy followed by drug-eluting stent placement from just below the joint space in the popliteal artery up to the origin of the superficial femoral artery.  There is two-vessel runoff via the peroneal and posterior tibial artery.  #2  Left superficial femoral artery occlusion  V. [name], M.D., [facility] [place] Office: [phone] Pager:  [phone]  VAS [place] ABI WITH/WO TBI  Result Date: 08/21/2019 LOWER EXTREMITY DOPPLER STUDY Indications: Gangrene, and peripheral artery disease. High Risk Factors: Diabetes.  Performing Technologist: June Leap Rvt, Rdms  Examination Guidelines: A complete evaluation includes at minimum, Doppler waveform signals and systolic blood pressure reading at the level of bilateral brachial, anterior tibial, and posterior tibial arteries, when vessel segments are accessible. Bilateral testing is considered an integral part of a complete examination. Photoelectric Plethysmograph (PPG) waveforms and toe systolic pressure readings are included as required and additional duplex testing as needed. Limited examinations for reoccurring indications may be performed as noted.  ABI Findings: +---------+-----------------+-----+------------------+-------------------------+ Right    Rt Pressure      IndexWaveform          Comment                            (mmHg)                                                            +---------+-----------------+-----+------------------+-------------------------+ Brachial                                         BP not taken-restricted                                                    arm                       +---------+-----------------+-----+------------------+-------------------------+ ATA      206               1.02 dampened  monophasic                                  +---------+-----------------+-----+------------------+-------------------------+ PTA      147              0.73 dampened                                                                   monophasic                                  +---------+-----------------+-----+------------------+-------------------------+ Great Toe                      Abnormal                                    +---------+-----------------+-----+------------------+-------------------------+ +---------+------------------+-----+-------------------+-------+ Left     Lt Pressure (mmHg)IndexWaveform           Comment +---------+------------------+-----+-------------------+-------+ Brachial 201                    triphasic                  +---------+------------------+-----+-------------------+-------+ ATA      97                0.48 dampened monophasic        +---------+------------------+-----+-------------------+-------+ PTA      19                0.09 dampened monophasic        +---------+------------------+-----+-------------------+-------+ Great Toe                       Abnormal                   +---------+------------------+-----+-------------------+-------+ Great toe PPG waveform too dampened to obtain pressures bilaterally.  Summary: Right: ABI is within normal range by ATA pressure, however abnormal pedal artery waveforms suggest this is falsely elevated. ABI is in the moderate range based on PTA pressure. Severely dampened PPG waveform of Great toe. Left: Resting left ankle-brachial index indicates severe left lower extremity arterial disease. Severely dampened PPG waveform of Great toe.  *See table(s) above for measurements and observations.  Electronically signed by Servando Snare MD on 08/21/2019 at 5:13:35 PM.    Final   [2  weeks]   Assessment: Pleasant 74 year old female admitted with hypoglycemia, found to have hypotension with heme positive stool/melena in the setting of chronic Eliquis, Plavix, and aspirin.  Hemoglobin without significant drop in your baseline.  No transfusion needed.  Clinical course complicated by recent DES placed in the right superficial femoral-popliteal artery on August 22, 2019, on Plavix and aspirin, along with Eliquis with high risk of stroke/TIA (CHADVASc score of 7, stroke in January 2021).  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  Currently aspirin and Eliquis both on hold, with Eliquis held since admission.  Difficult situation with risk of stroke high need for anticoagulation, with  Eliquis on hold until GI evaluation complete.  Heme positive stool: EGD completed this admission with mucosal coating of distal esophagus but negative for Candida, most likely pill related, single gastric polyp, erythematous antrum, single nonbleeding angiodysplastic lesion in the duodenum.  Outside colonoscopy normal in 2016.  Capsule study advised but needed agile first.  Patient was unable to swallow the agile capsule, it was placed via EGD yesterday.  Follow-up imaging due this afternoon to verify capsule passed.  At that point plans for EGD with capsule placement as feasible.  Anemia of chronic disease: End-stage renal disease on dialysis.  Hemoglobin 12 range in December 2020.  8-9 range over the past few months.  Hemoglobin yesterday stable at 8.5.    Pill induced esophagitis: discussed with attending. Consider converting medication over to elixir if possible. She has been on doxycycline tablets since prior to admission which can cause significant pill-induced esophagitis. Recommend sitting upright for minimum of 30 minutes after taking medicaiton and for 2 hours after eating. Will consider adding short course of carafate after capsule study.    Plan: 1. F/u abd film  today as scheduled.  2. Convert medications to liquid if possible, especially doxycyline.   Laureen Ochs. Bernarda Caffey [facility] [phone] 5/6/20219:13 AM    Laureen Ochs. Bernarda Caffey [facility] [phone] 5/6/20219:08 AM     LOS: 5 days

## 2019-09-19 NOTE — Progress Notes (Signed)
Admit: 09/12/2019 LOS: 5  74F ESRD THS [facility] with AMS, melelena on DAPT+DOAC  Subjective:  Palliative care has seen.  I saw on HD at 9:57 am.  Procedure supervised.  118/49 and HR 72.  Goal of 1kg.  Last had HD on 5/4 with 200 mL positive.  She was unable to tolerate UF due to hypotension despite cooling dialysate and albumin 25 gram x2 and midodrine 10 mg.  capsule study planned for tomorrow per team.  She states that she wants to go home.  No cramping.  Review of systems: Denies shortness of breath  States doesn't make any urine  No n/v  05/05 0701 - 05/06 0700 In: 100 [I.V.:100] Out: -   Filed Weights   09/16/19 0400 09/17/19 1630 09/18/19 1448  Weight: 87.8 kg 86.8 kg 86.8 kg    Scheduled Meds: . amiodarone  200 mg Oral Daily  . atorvastatin  80 mg Oral Daily  . Chlorhexidine Gluconate Cloth  6 each Topical Q0600  . Chlorhexidine Gluconate Cloth  6 each Topical Q0600  . clopidogrel  75 mg Oral Daily  . darbepoetin (ARANESP) injection - NON-DIALYSIS  100 mcg Subcutaneous Q Wed-1800  . doxycycline  100 mg Oral Q12H  . insulin aspart  0-9 Units Subcutaneous TID WC  . midodrine  10 mg Oral TID WC  . pantoprazole  40 mg Oral BID AC  . sevelamer carbonate  800 mg Oral TID WC  . sodium chloride flush  10-40 mL Intracatheter Q12H  . sodium chloride flush  3 mL Intravenous Q12H   Continuous Infusions: . sodium chloride Stopped (09/17/19 0900)  . sodium chloride    . sodium chloride    . albumin human 25 g (09/17/19 1615)  . sodium chloride     PRN Meds:.sodium chloride, sodium chloride, sodium chloride, acetaminophen **OR** acetaminophen, albumin human, albuterol, ALPRAZolam, lidocaine (PF), lidocaine-prilocaine, ondansetron **OR** ondansetron (ZOFRAN) IV, pentafluoroprop-tetrafluoroeth, promethazine, sodium chloride flush, sodium chloride flush  Current Labs: reviewed    Physical Exam:  Blood pressure (!) 98/48, pulse 72, temperature 98.2 F (36.8 C), temperature  source Oral, resp. rate 16, height 5\' 6"  (1.676 m), weight 86.8 kg, SpO2 100 %. Adult female awake in NAD  S1S2 no rub CTAB and unlabored No sig LEE appreciated AVG RFA in use  Dialysis Orders:[facility] T,Th,S 4.25 hr 400/600 85 kg 2.0 K/2.5 Ca R AVG -Heparin 2000 units IV initial bolus then 1000 units/hr. Stop 1 hour before end of treatment -Epogen 3600 units IV TIW -Hectorol 1.5 mcg IV TIW -Venofer 50 mg IV weekly  A 1. 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 Melena, EGD 5/2, neg for acute blood loss; per GI 3. Anemia: Hb 8s  4. CKD-BMD: sevelamer 5. 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 AMS, improved 7. Hypotension: on chornic midodrine.  Not tolerating HD well as above  P . HD per TTS schedule - - she is not tolerating UF well.  She was unable to tolerate UF last tx due to hypotension despite cooling dialysate and albumin 25 gram x2 and midodrine 10 mg.   . Start ESA - aranesp 100 mcg weekly for now - she never received the first dose ordered for 5/5. Reordered for 5/6  . Reduce sevelamer to 800 mg TID with meals for now phos 2.5 on 5/5 . Cont goals of care / Palliative discussions . Medication Issues; o Preferred narcotic agents for pain control are hydromorphone, fentanyl, and methadone. Morphine should not be used.  o Baclofen should be avoided o Avoid oral sodium phosphate and magnesium citrate based laxatives / bowel preps   Claudia Desanctis, MD 09/19/2019, 9:59 AM   Recent Labs  Lab 09/16/19 0405 09/18/19 0430 09/19/19 0856  NA 135 135 137  K 3.3* 3.6 3.6  CL 95* 98 97*  CO2 27 29 27   GLUCOSE 117* 145* 96  BUN 21 16 24*  CREATININE 5.88* 4.55* 6.92*  CALCIUM 8.8* 8.5* 8.9  PHOS 4.0 2.5 4.6   Recent Labs  Lab 09/12/19 2026 09/13/19 0626 09/16/19 0405 09/17/19 1221 09/18/19 0430  WBC 6.5   < > 7.2 7.0 7.0  NEUTROABS 5.2  --   --   --   --   HGB 9.9*   < > 8.5* 8.7* 8.5*  HCT 31.0*   < > 26.8* 27.4* 26.5*  MCV 81.4   < > 81.7 82.0 81.3  PLT 238   < > 185 168 136*   <  > = values in this interval not displayed.

## 2019-09-19 NOTE — Progress Notes (Signed)
Sanjuana Letters with nursing. Patient has not had food today. She took sips of liquid for medication this morning. Now NPO. Updated nursing that we will plan on EGD with Givens capsule placement this afternoon if KUB shows patency capsule passed.   Laureen Ochs. Bernarda Caffey [facility] [phone] 5/6/202111:42 AM

## 2019-09-20 ENCOUNTER — Inpatient Hospital Stay (HOSPITAL_COMMUNITY): Payer: Medicare Other | Admitting: Anesthesiology

## 2019-09-20 ENCOUNTER — Encounter (HOSPITAL_COMMUNITY): Payer: Self-pay | Admitting: Internal Medicine

## 2019-09-20 ENCOUNTER — Encounter (HOSPITAL_COMMUNITY)
Admission: EM | Disposition: A | Discharge status: Skilled Nursing Facility | Payer: Self-pay | Attending: Family Medicine

## 2019-09-20 DIAGNOSIS — K209 Esophagitis, unspecified without bleeding: Secondary | ICD-10-CM | POA: Diagnosis not present

## 2019-09-20 DIAGNOSIS — K317 Polyp of stomach and duodenum: Secondary | ICD-10-CM

## 2019-09-20 DIAGNOSIS — K2970 Gastritis, unspecified, without bleeding: Secondary | ICD-10-CM | POA: Diagnosis not present

## 2019-09-20 HISTORY — PX: ESOPHAGOGASTRODUODENOSCOPY (EGD) WITH PROPOFOL: SHX5813

## 2019-09-20 HISTORY — PX: GIVENS CAPSULE STUDY: SHX5432

## 2019-09-20 HISTORY — PX: POLYPECTOMY: SHX5525

## 2019-09-20 LAB — CBC
HCT: 28.4 % — ABNORMAL LOW (ref 36.0–46.0)
Hemoglobin: 9.2 g/dL — ABNORMAL LOW (ref 12.0–15.0)
MCH: 26.5 pg (ref 26.0–34.0)
MCHC: 32.4 g/dL (ref 30.0–36.0)
MCV: 81.8 fL (ref 80.0–100.0)
Platelets: 113 10*3/uL — ABNORMAL LOW (ref 150–400)
RBC: 3.47 MIL/uL — ABNORMAL LOW (ref 3.87–5.11)
RDW: 19.2 % — ABNORMAL HIGH (ref 11.5–15.5)
WBC: 6.1 10*3/uL (ref 4.0–10.5)
nRBC: 0 % (ref 0.0–0.2)

## 2019-09-20 LAB — GLUCOSE, CAPILLARY
Glucose-Capillary: 68 mg/dL — ABNORMAL LOW (ref 70–99)
Glucose-Capillary: 145 mg/dL — ABNORMAL HIGH (ref 70–99)
Glucose-Capillary: 75 mg/dL (ref 70–99)
Glucose-Capillary: 67 mg/dL — ABNORMAL LOW (ref 70–99)
Glucose-Capillary: 121 mg/dL — ABNORMAL HIGH (ref 70–99)
Glucose-Capillary: 79 mg/dL (ref 70–99)
Glucose-Capillary: 101 mg/dL — ABNORMAL HIGH (ref 70–99)
Glucose-Capillary: 142 mg/dL — ABNORMAL HIGH (ref 70–99)
Glucose-Capillary: 105 mg/dL — ABNORMAL HIGH (ref 70–99)

## 2019-09-20 SURGERY — ESOPHAGOGASTRODUODENOSCOPY (EGD) WITH PROPOFOL
Anesthesia: General

## 2019-09-20 MED ORDER — PROPOFOL 500 MG/50ML IV EMUL
INTRAVENOUS | Status: DC | PRN
  Administered 2019-09-20: 125 ug/kg/min via INTRAVENOUS

## 2019-09-20 MED ORDER — DEXTROSE 50 % IV SOLN
12.5000 mL | Freq: Once | INTRAVENOUS | Status: AC
  Administered 2019-09-20: 12.5 mL via INTRAVENOUS

## 2019-09-20 MED ORDER — SODIUM CHLORIDE 0.9 % IV SOLN
INTRAVENOUS | Status: DC
  Administered 2019-09-20: 500 mL via INTRAVENOUS

## 2019-09-20 MED ORDER — LIDOCAINE VISCOUS HCL 2 % MT SOLN: 15.0000 mL | Freq: Once | OROMUCOSAL | Status: DC

## 2019-09-20 MED ORDER — PANTOPRAZOLE SODIUM 40 MG PO PACK
40.0000 mg | PACK | Freq: Two times a day (BID) | ORAL | Status: DC
  Administered 2019-09-21 – 2019-09-23 (×6): 40 mg via ORAL
  Filled 2019-09-20 (×10): qty 20

## 2019-09-20 MED ORDER — GLUCOSE 40 % PO GEL
ORAL | Status: AC
  Filled 2019-09-20: qty 1

## 2019-09-20 MED ORDER — PHENYLEPHRINE 40 MCG/ML (10ML) SYRINGE FOR IV PUSH (FOR BLOOD PRESSURE SUPPORT)
PREFILLED_SYRINGE | INTRAVENOUS | Status: AC
  Filled 2019-09-20: qty 10

## 2019-09-20 MED ORDER — LIDOCAINE VISCOUS HCL 2 % MT SOLN
OROMUCOSAL | Status: AC
  Filled 2019-09-20: qty 15

## 2019-09-20 MED ORDER — PANTOPRAZOLE SODIUM 40 MG PO PACK: 40.0000 mg | PACK | Freq: Two times a day (BID) | ORAL | Status: DC

## 2019-09-20 MED ORDER — PROPOFOL 10 MG/ML IV BOLUS
INTRAVENOUS | Status: DC | PRN
  Administered 2019-09-20 (×2): 40 mg via INTRAVENOUS

## 2019-09-20 MED ORDER — DEXTROSE 50 % IV SOLN
INTRAVENOUS | Status: AC
  Filled 2019-09-20: qty 50

## 2019-09-20 NOTE — Plan of Care (Signed)
  Problem: Acute Rehab PT Goals(only PT should resolve) Goal: Pt Will Go Supine/Side To Sit Outcome: Progressing Flowsheets (Taken 09/20/2019 1049) Pt will go Supine/Side to Sit: with moderate assist Goal: Patient Will Perform Sitting Balance Outcome: Progressing Flowsheets (Taken 09/20/2019 1049) Patient will perform sitting balance:  with modified independence  with supervision Goal: Patient Will Transfer Sit To/From Stand Outcome: Progressing Flowsheets (Taken 09/20/2019 1049) Patient will transfer sit to/from stand:  with moderate assist  with maximum assist Goal: Pt Will Transfer Bed To Chair/Chair To Bed Outcome: Progressing Flowsheets (Taken 09/20/2019 1049) Pt will Transfer Bed to Chair/Chair to Bed:  with mod assist  with max assist   10:50 AM, 09/20/19 Lonell Grandchild, MPT Physical Therapist with [facility] 336 [phone] office [phone] mobile phone

## 2019-09-20 NOTE — Progress Notes (Signed)
Admit: 09/12/2019 LOS: 6  22F ESRD THS [facility] with AMS, melelena on DAPT+DOAC  Subjective:  . No interval events . GI still eval for GIB . HD yesterday 1L UF  05/06 0701 - 05/07 0700 In: -  Out: 1000   Filed Weights   09/18/19 1448 09/19/19 0933 09/19/19 1253  Weight: 86.8 kg 89 kg 88 kg    Scheduled Meds: . amiodarone  200 mg Oral Daily  . atorvastatin  80 mg Oral Daily  . Chlorhexidine Gluconate Cloth  6 each Topical Q0600  . Chlorhexidine Gluconate Cloth  6 each Topical Q0600  . clopidogrel  75 mg Oral Daily  . darbepoetin (ARANESP) injection - NON-DIALYSIS  100 mcg Subcutaneous Q Thu-1800  . doxycycline  100 mg Oral Q12H  . insulin aspart  0-9 Units Subcutaneous TID WC  . midodrine  10 mg Oral TID WC  . pantoprazole  40 mg Oral BID AC  . sevelamer carbonate  800 mg Oral TID WC  . sodium chloride flush  10-40 mL Intracatheter Q12H  . sodium chloride flush  3 mL Intravenous Q12H   Continuous Infusions: . sodium chloride Stopped (09/17/19 0900)  . sodium chloride    . sodium chloride    . albumin human 25 g (09/19/19 0947)  . sodium chloride     PRN Meds:.sodium chloride, sodium chloride, sodium chloride, acetaminophen **OR** acetaminophen, albumin human, albuterol, ALPRAZolam, lidocaine (PF), lidocaine-prilocaine, ondansetron **OR** ondansetron (ZOFRAN) IV, pentafluoroprop-tetrafluoroeth, promethazine, sodium chloride flush, sodium chloride flush  Current Labs: reviewed    Physical Exam:  Blood pressure (!) 125/52, pulse 76, temperature 98.9 F (37.2 C), temperature source Oral, resp. rate 17, height 5\' 6"  (1.676 m), weight 88 kg, SpO2 94 %. NAD, awake lying in bed RRR CTAB No sig LEE< feet bandaged AVG RFA +B/T RUE  A 1. 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 Melena, EGD 5/2, neg for acute blood loss; per GI, ca psule study in process 3. Anemia: Hb 9.2, CTM; rec ESA 5/6 4. CKD-BMD: sevelamer reduced, P ok 5. 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 AMS,  stable 7. Hypotension: on chornic midodrine  P . HD THS schedule AVG, 3.5h, no heparin, 4K, 1-2L UF SBP > 95 . Medication Issues; o Preferred narcotic agents for pain control are hydromorphone, fentanyl, and methadone. Morphine should not be used.  o Baclofen should be avoided o Avoid oral sodium phosphate and magnesium citrate based laxatives / bowel preps    Pearson Grippe MD 09/20/2019, 12:07 PM  Recent Labs  Lab 09/16/19 0405 09/18/19 0430 09/19/19 0856  NA 135 135 137  K 3.3* 3.6 3.6  CL 95* 98 97*  CO2 27 29 27   GLUCOSE 117* 145* 96  BUN 21 16 24*  CREATININE 5.88* 4.55* 6.92*  CALCIUM 8.8* 8.5* 8.9  PHOS 4.0 2.5 4.6   Recent Labs  Lab 09/17/19 1221 09/18/19 0430 09/20/19 0439  WBC 7.0 7.0 6.1  HGB 8.7* 8.5* 9.2*  HCT 27.4* 26.5* 28.4*  MCV 82.0 81.3 81.8  PLT 168 136* 113*

## 2019-09-20 NOTE — Interval H&P Note (Signed)
History and Physical Interval Note:  09/20/2019 3:30 PM  Alexis [name]  has presented today for surgery, with the diagnosis of anemia,melena.  The various methods of treatment have been discussed with the patient and family. After consideration of risks, benefits and other options for treatment, the patient has consented to  Procedure(s): ESOPHAGOGASTRODUODENOSCOPY (EGD) WITH PROPOFOL (N/A) GIVENS CAPSULE STUDY (N/A) as a surgical intervention.  The patient's history has been reviewed, patient examined, no change in status, stable for surgery.  I have reviewed the patient's chart and labs.  Questions were answered to the patient's satisfaction.     [employer]

## 2019-09-20 NOTE — Anesthesia Procedure Notes (Signed)
Date/Time: 09/20/2019 3:34 PM Performed by: Vista Deck, CRNA Pre-anesthesia Checklist: Patient identified, Emergency Drugs available, Suction available, Timeout performed and Patient being monitored Patient Re-evaluated:Patient Re-evaluated prior to induction Oxygen Delivery Method: Non-rebreather mask

## 2019-09-20 NOTE — TOC Progression Note (Signed)
Transition of Care [facility]) - Progression Note    Patient Details  Name: Alexis [name] MRN: [id number] Date of Birth: [birth year]  Transition of Care [facility]) CM/SW Contact  Boneta Lucks, RN Phone Number: 09/20/2019, 1:44 PM  Clinical Narrative:   Patient will need [place] calling for [place] to take [facility]. Georgina Snell with [facility] accepted the referral.    Expected Discharge Plan: [place] Barriers to Discharge: Continued Medical Work up  Expected Discharge Plan and Services Expected Discharge Plan: [place] Choice: Durable Medical Equipment, Home Health Living arrangements for the past 2 months: Apartment Expected Discharge Date: 09/20/19               DME Arranged: Bedside commode, Hospital bed DME Agency: [employer] Date DME Agency Contacted: 09/18/19 Time DME Agency Contacted: [phone] Representative spoke with at DME Agency: [place]: PT [place]: [address] Date [place]: 09/20/19 Time [place]: 1343 Representative spoke with at [place]: [place] ([place]) Interventions    Readmission Risk Interventions Readmission Risk Prevention Plan 06/12/2019  Transportation Screening Complete  PCP or Specialist Appt within 3-5 Days Not Complete  HRI or [place] Complete  Social Work Consult for [employer] Planning/Counseling [place] Not Complete  Medication Review Press photographer) Complete  Some recent data might be hidden

## 2019-09-20 NOTE — Anesthesia Preprocedure Evaluation (Signed)
Anesthesia Evaluation  Patient identified by MRN, date of birth, ID band Patient awake    Reviewed: Allergy & Precautions, H&P , NPO status , Patient's Chart, lab work & pertinent test results, reviewed documented beta blocker date and time   Airway Mallampati: III  TM Distance: >3 FB Neck ROM: full    Dental no notable dental hx.    Pulmonary shortness of breath, former smoker,    Pulmonary exam normal breath sounds clear to auscultation       Cardiovascular Exercise Tolerance: Good hypertension, + angina + CAD, + Peripheral Vascular Disease and +CHF  + dysrhythmias Atrial Fibrillation  Rhythm:regular Rate:Normal     Neuro/Psych  Headaches, PSYCHIATRIC DISORDERS Anxiety CVA    GI/Hepatic Neg liver ROS, GERD  Medicated,  Endo/Other  negative endocrine ROSdiabetes  Renal/GU CRFRenal disease  negative genitourinary   Musculoskeletal   Abdominal   Peds  Hematology  (+) Blood dyscrasia, anemia ,   Anesthesia Other Findings   Reproductive/Obstetrics negative OB ROS                             Anesthesia Physical Anesthesia Plan  ASA: III  Anesthesia Plan: General   Post-op Pain Management:    Induction:   PONV Risk Score and Plan: 2 and Propofol infusion  Airway Management Planned:   Additional Equipment:   Intra-op Plan:   Post-operative Plan:   Informed Consent: I have reviewed the patients History and Physical, chart, labs and discussed the procedure including the risks, benefits and alternatives for the proposed anesthesia with the patient or authorized representative who has indicated his/her understanding and acceptance.     Dental Advisory Given  Plan Discussed with: CRNA  Anesthesia Plan Comments:         Anesthesia Quick Evaluation

## 2019-09-20 NOTE — Anesthesia Postprocedure Evaluation (Signed)
Anesthesia Post Note  Patient: Alexis [name]  Procedure(s) Performed: ESOPHAGOGASTRODUODENOSCOPY (EGD) WITH PROPOFOL (N/A ) GIVENS CAPSULE STUDY (N/A ) POLYPECTOMY  Patient location during evaluation: PACU Anesthesia Type: General Level of consciousness: sedated, confused and responds to stimulation Pain management: pain level controlled Vital Signs Assessment: post-procedure vital signs reviewed and stable Respiratory status: spontaneous breathing and nonlabored ventilation Cardiovascular status: blood pressure returned to baseline and stable Postop Assessment: no headache and no apparent nausea or vomiting Anesthetic complications: no     Last Vitals:  Vitals:   09/20/19 1330 09/20/19 1405  BP: (!) 120/49 134/62  Pulse: 77 77  Resp: 17 (!) 22  Temp: 37.5 C 37 C  SpO2: 100% 94%    Last Pain:  Vitals:   09/20/19 1405  TempSrc: Oral  PainSc: 0-No pain                 Louann Sjogren

## 2019-09-20 NOTE — Progress Notes (Signed)
Reviewed chart, patient hgb 9.2 this morning. Abd XRay yesterday evening with capsule apparently in distal bowel. Previous XRay with no evidence of obstruction or ileus. No BMP/CMP today but renal panel yesterday with normal electrolytes. Spoke with patient's nurse, consent signed by daughter/POA (patient has dementia and not of the mindset to consent). Notified preop staff of daughter POA status. Procedure tentatively scheduled for 3:00 pm  Thank you for allowing [place] to participate in the care of [name], DNP, AGNP-C Adult & Gerontological Nurse Practitioner [facility]

## 2019-09-20 NOTE — Transfer of Care (Signed)
Immediate Anesthesia Transfer of Care Note  Patient: Loney Laurence  Procedure(s) Performed: ESOPHAGOGASTRODUODENOSCOPY (EGD) WITH PROPOFOL (N/A ) GIVENS CAPSULE STUDY (N/A ) POLYPECTOMY  Patient Location: PACU  Anesthesia Type:General  Level of Consciousness: sedated and patient cooperative  Airway & Oxygen Therapy: Patient Spontanous Breathing and Patient connected to nasal cannula oxygen  Post-op Assessment: Report given to RN and Post -op Vital signs reviewed and stable  Post vital signs: Reviewed and stable  Last Vitals:  Vitals Value Taken Time  BP    Temp    Pulse    Resp    SpO2      Last Pain:  Vitals:   09/20/19 1405  TempSrc: Oral  PainSc: 0-No pain      Patients Stated Pain Goal: 10 (03/75/43 6067)  Complications: No apparent anesthesia complications  SEE PACU FLOW SHEET FOR VITAL SIGNS

## 2019-09-20 NOTE — Op Note (Signed)
[facility] Patient Name: Alexis [name] Procedure Date: 09/20/2019 2:08 PM MRN: [id number] Date of Birth: [birth year] Attending MD: Barney Drain MD, MD CSN: [id number] Age: 74 Admit Type: Inpatient Procedure:                Upper GI endoscopy-SNARE CAUTERY POLYPECTOMY/GIVENS                            CAPSULE PLACEMENT Indications:              [place] Providers:                Barney Drain MD, MD, Charlsie Quest. Theda Sers RN, RN,                            Nelma Rothman, Technician Referring MD:              Medicines:                Propofol per Anesthesia Complications:            No immediate complications. Estimated Blood Loss:     Estimated blood loss was minimal. Procedure:                Pre-Anesthesia Assessment:                           - Prior to the procedure, a History and Physical                            was performed, and patient medications and                            allergies were reviewed. The patient's tolerance of                            previous anesthesia was also reviewed. The risks                            and benefits of the procedure and the sedation                            options and risks were discussed with the patient.                            All questions were answered, and informed consent                            was obtained. Prior Anticoagulants: The patient has                            taken Plavix (clopidogrel), last dose was day of                            procedure. ASA Grade Assessment: III - A patient  with severe systemic disease. After reviewing the                            risks and benefits, the patient was deemed in                            satisfactory condition to undergo the procedure.                            After obtaining informed consent, the endoscope was                            passed under direct vision. Throughout the                            procedure, the patient's blood  pressure, pulse, and                            oxygen saturations were monitored continuously. The                            [id number]-H190 ([id number]) scope was introduced through the                            mouth, and advanced to the second part of duodenum.                            The upper GI endoscopy was accomplished without                            difficulty. The patient tolerated the procedure                            well. Scope In: 3:44:45 PM Scope Out: 3:58:35 PM Total Procedure Duration: 0 hours 13 minutes 50 seconds  Findings:      LA Grade C (one or more mucosal breaks continuous between tops of 2 or       more mucosal folds, less than 75% circumference) esophagitis with no       bleeding was found.      A single 8 mm sessile polyp with no bleeding and no stigmata of recent       bleeding was found in the cardia. The polyp was removed with a hot       snare. Resection and retrieval were complete. To prevent bleeding after       the polypectomy, two hemostatic clips were successfully placed (MR       conditional). There was no bleeding at the end of the procedure.      The examined duodenum was normal. GIVENS CAPSULE RELEASED INTO DUODENAL       BULB. PICTURES ON RECORDER CONFIRM LOCATION.      Diffuse moderate inflammation characterized by congestion (edema) and       erythema was found in the entire examined stomach. Impression:               - LA Grade C reflux esophagitis with  no bleeding.                           - A single gastric polyp. Resected and retrieved.                            Clips (MR conditional) were placed.                           - Normal examined duodenum.                           - MODERATE Gastritis. Moderate Sedation:      Per Anesthesia Care Recommendation:           - Return patient to hospital ward for ongoing care.                           - Clear liquid diet AT 1800, FULL LIQUID AT 2000,                            RENAL DIET AT  2200.                           - Continue present medications. PROTONIX BID. MAY                            RE-START ELIQUIS MAY 8 WITH EVENING DOSE.                           - Await pathology results.                           - Return to GI clinic in 4 months. Procedure Code(s):        --- Professional ---                           [phone], Esophagogastroduodenoscopy, flexible,                            transoral; with removal of tumor(s), polyp(s), or                            other lesion(s) by snare technique Diagnosis Code(s):        --- Professional ---                           K21.00, Gastro-esophageal reflux disease with                            esophagitis, without bleeding                           K31.7, Polyp of stomach and duodenum                           K29.70, Gastritis, unspecified, without bleeding  K92.1, Melena (includes Hematochezia) CPT copyright 2019 American Medical Association. All rights reserved. The codes documented in this report are preliminary and upon coder review may  be revised to meet current compliance requirements. Barney Drain, MD Barney Drain MD, MD 09/20/2019 4:20:40 PM This report has been signed electronically. Number of Addenda: 0

## 2019-09-20 NOTE — Care Management Important Message (Signed)
Important Message  Patient Details  Name: Alexis [name] MRN: [id number] Date of Birth: Jan 25, 1946   Medicare Important Message Given:  Yes     Tommy Medal 09/20/2019, 3:45 PM

## 2019-09-20 NOTE — Progress Notes (Signed)
PROGRESS NOTE    Alexis [name]  ZOX:[id number] DOB: 1945-11-09 DOA: 09/12/2019 PCP: System, Provider Not In    Brief Narrative:  74 year old female with a history of atrial fibrillation on anticoagulation, end-stage renal disease, coronary artery disease, peripheral vascular disease, diabetes, was recently discharged from the hospital on 08/31/19 after she underwent left above-the-knee amputation and was started on dual antiplatelet therapy for drug-eluting stent placed in her right lower extremity.  She was admitted to the hospital with severe hypoglycemia.  She was cool and clammy and hemodialysis.  Further work-up showed that she was hypotensive and guaiac positive.  She was admitted for further work-up of GI bleeding.  Assessment & Plan:   Active Problems:   Anemia in chronic kidney disease (CKD)   Type II diabetes mellitus with manifestations (HCC)   ESRD on dialysis [facility])   Atrial fibrillation with rapid ventricular response (HCC)   Osteomyelitis (HCC)   UGI bleed   Melena   Hx of AKA (above knee amputation), left (HCC)   Pressure injury of skin   Hypotension   Hypoglycemia   Anxiety state   Palliative care by specialist   Goals of care, counseling/discussion   Gastric polyp   1)GI bleed.  Patient had guaiac positive stools.  She is on chronic Eliquis and dual antiplatelet therapy with aspirin and Plavix.  Eliquis currently on hold.    She was seen by gastroenterology and underwent EGD on 5/2 that did not show any signs of active bleeding.  Patient was unable to swallow agile capsule today.  Dr. Gala Romney placed patency agile capsule on 5/5 endoscopically, Repeat EGD with Givens capsule placement on 09/20/2019  --- GIVENS CAPSULE RELEASED INTO DUODENAL       BULB. PICTURES ON RECORDER CONFIRM LOCATION.      Diffuse moderate inflammation characterized by congestion (edema) and  erythema was found in the entire examined stomach. Impression:   - LA Grade C reflux esophagitis with no  bleeding and moderate gastritis, gastric polyp removed -Gastroenterologist recommends restarting Eliquis on 09/21/2019 evening dose, recommends liquid diet for 09/20/2019, possibly advancing diet on 09/21/2019  2)Anemia of chronic kidney disease.  With concerns about GI bleed, hemoglobin currently stable at 9.2   3)Altered mental status.  Secondary to hypoglycemia.  She received D50.  Overall blood sugars have improved.  Mental status is also improved.  4)Atrial fibrillation without ventricular response.  Chronically on amiodarone.  Eliquis on hold due to GI bleeding.  She was treated with amiodarone infusion.  Heart rates are now improved and she is back on oral amiodarone.  Discussed with cardiology, Dr. Domenic Polite regarding risks and benefits of continuing Eliquis.  Currently, her CHADSVASc score is 7, putting her at a 15.7% risk of stroke/TIA.  She recently had a stroke and 05/2019, making her high risk for recurrence.  5)End-stage renal disease on hemodialysis, Tuesday, Thursday, Saturday.  Nephrology following.  6)Hypotension.  Patient has chronic hypotension and is on midodrine.  Midodrine dose was increased to 10 mg 3 times daily.  Overall blood pressures are improved.Marland Kitchen  7)Right lower extremity chronic osteomyelitis.  She is on chronic doxycycline last dose 09/20/2019.  Follow-up with orthopedics.  8)Diabetes.  Started on sliding scale insulin.  Blood sugars have been stable.  9)Peripheral vascular disease.  Recently had drug-eluting stent placed in the right superficial femoral-popliteal artery on 08/22/2019.  She is on dual antiplatelet therapy.  Discussed with Dr. Doren Custard on-call for vascular surgery who agreed with stopping aspirin for now, but  requested the Plavix be continued due to recent stent.  NB!!! 10) Anticoagulation---.  This is a complicated situation.  Patient will be at risk for bleeding with anticoagulation, although her risk of stroke is also very high.  Palliative care has initiated  discussions regarding goals of care, patient/family wish to continue full scope of care at this time.  Plan is to try the patient back on Eliquis once GI work-up is complete and monitor for any bleeding recurrence.  11)Distal esophagitis with overlying exudate almost certainly chemical induced (medication)--- liquid diet and Convert as many pills/ capsules to the elixir form as feasible. -Doxycycline discontinued, Protonix changed to liquid  12) dementia--- patient with underlying memory and cognitive deficits  DVT prophylaxis: SCDs Code Status: Full code Family Communication:  D/w  daughter (POA)-- [phone]---   Disposition Plan: Status is: Inpatient--- discussed with patient POA/daughter she requested discharge back to SNF rather than home with home health  Remains inpatient appropriate because:Inpatient level of care appropriate due to severity of illness.  -Not tolerating oral intake well, repeat EGD with gibbons capsule placement on 09/20/2019  Dispo: The patient is from: SNF              Anticipated d/c is to: SNF              Anticipated d/c date is: In 1 to 2 days pending GI work-up--- and if tolerating oral intake well              Barrier--Patient currently is not medically stable to d/c.--- Just started on liquid diet on 09/20/2019 anticipate discharge once tolerating oral intake and if no bleeding after restarting Eliquis--Eliquis to be restarted on 09/21/2019  Consultants:   Gastroenterology  Nephrology  General surgery for central line placement  Palliative care  Procedures:   5/1 right femoral vein central line, removed on 5/4 5/2 EGD: Normal hypopharynx.                           - Normal proximal esophagus and mid esophagus.                           - Mucosal coating at distal esophagus. ? candida                            esophagits. ? dissolved pill. Cells for cytology                            obtained.                           - Z-line irregular, 39 cm  from the incisors.                           - A single gastric polyp.                           - Erythematous mucosa in the antrum.                           - Normal duodenal bulb.                           -  A single non-bleeding angiodysplastic lesion in                            the duodenum  -09/18/19-EGD with agile patency capsule placement  Antimicrobials:       Subjective: -Status post EGD with Capsule Placement on 09/20/2019 --She wants to try clear liquid diet for now  Objective: Vitals:   09/20/19 1605 09/20/19 1615 09/20/19 1630 09/20/19 1708  BP:  116/68 (!) 123/56 (!) 121/52  Pulse:  87 80 81  Resp:  (!) 28 19 18   Temp: 98.5 F (36.9 C) 98.2 F (36.8 C)    TempSrc:      SpO2:  95% 93% 100%  Weight:      Height:        Intake/Output Summary (Last 24 hours) at 09/20/2019 1717 Last data filed at 09/20/2019 1545 Gross per 24 hour  Intake 250 ml  Output --  Net 250 ml   Filed Weights   09/18/19 1448 09/19/19 0933 09/19/19 1253  Weight: 86.8 kg 89 kg 88 kg    Examination:  General exam: Alert, awake, in no acute distress respiratory system: Clear to auscultation. Respiratory effort normal. Cardiovascular system: Irregular. No murmurs, rubs, gallops. Gastrointestinal system: Abdomen is nondistended, soft and nontender. No organomegaly or masses felt. Normal bowel sounds heard. Central nervous system: Alert and oriented.  Generalized weakness, no focal neurological deficits. Extremities: Left above-the-knee amputation--- staples intact Skin: No rashes, lesions or ulcers Psychiatry: Baseline Memory and cognitive deficits MSK=-right forearm AV fistula with positive thrill and bruit  Data Reviewed: I have personally reviewed following labs and imaging studies  CBC: Recent Labs  Lab 09/15/19 0347 09/16/19 0405 09/17/19 1221 09/18/19 0430 09/20/19 0439  WBC 6.4 7.2 7.0 7.0 6.1  HGB 8.9* 8.5* 8.7* 8.5* 9.2*  HCT 28.5* 26.8* 27.4* 26.5* 28.4*  MCV 81.7  81.7 82.0 81.3 81.8  PLT 176 185 168 136* 518*   Basic Metabolic Panel: Recent Labs  Lab 09/14/19 0454 09/15/19 0347 09/16/19 0405 09/18/19 0430 09/19/19 0856  NA 134* 134* 135 135 137  K 4.4 3.2* 3.3* 3.6 3.6  CL 94* 95* 95* 98 97*  CO2 23 26 27 29 27   GLUCOSE 155* 80 117* 145* 96  BUN 28* 13 21 16  24*  CREATININE 6.72* 3.82* 5.88* 4.55* 6.92*  CALCIUM 9.0 8.6* 8.8* 8.5* 8.9  PHOS  --   --  4.0 2.5 4.6   GFR: Estimated Creatinine Clearance: 8.1 mL/min (A) (by C-G formula based on SCr of 6.92 mg/dL (H)). Liver Function Tests: Recent Labs  Lab 09/16/19 0405 09/18/19 0430 09/19/19 0856  ALBUMIN 2.8* 3.3* 3.2*   No results for input(s): LIPASE, AMYLASE in the last 168 hours. No results for input(s): AMMONIA in the last 168 hours. Coagulation Profile: No results for input(s): INR, PROTIME in the last 168 hours. Cardiac Enzymes: No results for input(s): CKTOTAL, CKMB, CKMBINDEX, TROPONINI in the last 168 hours. BNP (last 3 results) No results for input(s): PROBNP in the last 8760 hours. HbA1C: No results for input(s): HGBA1C in the last 72 hours. CBG: Recent Labs  Lab 09/20/19 1407 09/20/19 1450 09/20/19 1621 09/20/19 1635 09/20/19 1706  GLUCAP 67* 121* 79 142* 101*   Lipid Profile: No results for input(s): CHOL, HDL, LDLCALC, TRIG, CHOLHDL, LDLDIRECT in the last 72 hours. Thyroid Function Tests: No results for input(s): TSH, T4TOTAL, FREET4, T3FREE, THYROIDAB in the last 72 hours. Anemia Panel: No results  for input(s): VITAMINB12, FOLATE, FERRITIN, TIBC, IRON, RETICCTPCT in the last 72 hours. Sepsis Labs: No results for input(s): PROCALCITON, LATICACIDVEN in the last 168 hours.  Recent Results (from the past 240 hour(s))  Respiratory Panel by RT PCR (Flu A&B, Covid) - Nasopharyngeal Swab     Status: None   Collection Time: 09/12/19 10:19 PM   Specimen: Nasopharyngeal Swab  Result Value Ref Range Status   SARS Coronavirus 2 by RT PCR NEGATIVE NEGATIVE Final     Comment: (NOTE) SARS-CoV-2 target nucleic acids are NOT DETECTED. The SARS-CoV-2 RNA is generally detectable in upper respiratoy specimens during the acute phase of infection. The lowest concentration of SARS-CoV-2 viral copies this assay can detect is 131 copies/mL. A negative result does not preclude SARS-Cov-2 infection and should not be used as the sole basis for treatment or other patient management decisions. A negative result may occur with  improper specimen collection/handling, submission of specimen other than nasopharyngeal swab, presence of viral mutation(s) within the areas targeted by this assay, and inadequate number of viral copies (<131 copies/mL). A negative result must be combined with clinical observations, patient history, and epidemiological information. The expected result is Negative. Fact Sheet for Patients:  PinkCheek.be Fact Sheet for Healthcare Providers:  GravelBags.it This test is not yet ap proved or cleared by the [place] FDA and  has been authorized for detection and/or diagnosis of SARS-CoV-2 by FDA under an Emergency Use Authorization (EUA). This EUA will remain  in effect (meaning this test can be used) for the duration of the COVID-19 declaration under Section 564(b)(1) of the Act, 21 U.S.C. section 360bbb-3(b)(1), unless the authorization is terminated or revoked sooner.    Influenza A by PCR NEGATIVE NEGATIVE Final   Influenza B by PCR NEGATIVE NEGATIVE Final    Comment: (NOTE) The Xpert Xpress SARS-CoV-2/FLU/RSV assay is intended as an aid in  the diagnosis of influenza from Nasopharyngeal swab specimens and  should not be used as a sole basis for treatment. Nasal washings and  aspirates are unacceptable for Xpert Xpress SARS-CoV-2/FLU/RSV  testing. Fact Sheet for Patients: PinkCheek.be Fact Sheet for Healthcare  Providers: GravelBags.it This test is not yet approved or cleared by the [place] FDA and  has been authorized for detection and/or diagnosis of SARS-CoV-2 by  FDA under an Emergency Use Authorization (EUA). This EUA will remain  in effect (meaning this test can be used) for the duration of the  Covid-19 declaration under Section 564(b)(1) of the Act, 21  U.S.C. section 360bbb-3(b)(1), unless the authorization is  terminated or revoked. Performed at [facility], [address] [place], [place] [address]   MRSA PCR Screening     Status: None   Collection Time: 09/14/19  3:36 AM   Specimen: Nasal Mucosa; Nasopharyngeal  Result Value Ref Range Status   MRSA by PCR NEGATIVE NEGATIVE Final    Comment:        The GeneXpert MRSA Assay (FDA approved for NASAL specimens only), is one component of a comprehensive MRSA colonization surveillance program. It is not intended to diagnose MRSA infection nor to guide or monitor treatment for MRSA infections. Performed at [facility], [address] [place], [place] [address]   KOH prep     Status: None   Collection Time: 09/15/19 12:37 PM   Specimen: PATH GI Other  Result Value Ref Range Status   Specimen Description ESOPHAGUS  Final   Special Requests NONE  Final   KOH Prep   Final  NO YEAST OR FUNGAL ELEMENTS SEEN Performed at [facility], [address] [place], [place] [address]    Report Status 09/15/2019 FINAL  Final         Radiology Studies: DG Abd 1 View  Result Date: 09/19/2019 CLINICAL DATA:  Gastrointestinal bleeding, assess endoscopy capsule position EXAM: ABDOMEN - 1 VIEW COMPARISON:  09/19/2019 at 3:27 p.m. FINDINGS: Supine frontal view of the abdomen and pelvis excludes the right flank and hemidiaphragms by collimation. The endoscopy capsule projects over the left upper quadrant, in the expected region of the splenic flexure of the colon. No bowel obstruction. Extensive vascular  calcifications. IMPRESSION: 1. Endoscopy capsule overlying left upper quadrant, likely within the distal colon. Electronically Signed   By: Randa Ngo M.D.   On: 09/19/2019 19:51   DG Abd 1 View  Result Date: 09/19/2019 CLINICAL DATA:  Endoscopy capsule. EXAM: ABDOMEN - 1 VIEW COMPARISON:  August 18, 2019. FINDINGS: The bowel gas pattern is normal. Endoscopy capsule is seen just to the right of the lower lumbar spine. No radio-opaque calculi or other significant radiographic abnormality are seen. IMPRESSION: Endoscopy capsule is seen just to the right of the lower lumbar spine. No evidence of bowel obstruction or ileus. Electronically Signed   By: Marijo Conception M.D.   On: 09/19/2019 15:36        Scheduled Meds: . amiodarone  200 mg Oral Daily  . atorvastatin  80 mg Oral Daily  . Chlorhexidine Gluconate Cloth  6 each Topical Q0600  . Chlorhexidine Gluconate Cloth  6 each Topical Q0600  . clopidogrel  75 mg Oral Daily  . darbepoetin (ARANESP) injection - NON-DIALYSIS  100 mcg Subcutaneous Q Thu-1800  . insulin aspart  0-9 Units Subcutaneous TID WC  . midodrine  10 mg Oral TID WC  . pantoprazole sodium  40 mg Oral BID WC  . sevelamer carbonate  800 mg Oral TID WC  . sodium chloride flush  10-40 mL Intracatheter Q12H  . sodium chloride flush  3 mL Intravenous Q12H   Continuous Infusions: . sodium chloride Stopped (09/17/19 0900)  . sodium chloride    . sodium chloride    . albumin human 25 g (09/19/19 0947)  . sodium chloride       LOS: 6 days   Roxan Hockey, MD [facility]   If 7PM-7AM, please contact night-coverage [email or web address]  09/20/2019, 5:17 PM

## 2019-09-20 NOTE — Evaluation (Signed)
Physical Therapy Evaluation Patient Details Name: Alexis [name] MRN: [id number] DOB: 1946-01-23 Today's Date: 09/20/2019   History of Present Illness  Dixie Coppa  is a 74 y.o. female, with history of atrial fibrillation on anticoagulation with Eliquis, ESRD on HD TTS, CAD, pulmonary hypertension, hypertension, hyperlipidemia, type 2 diabetes mellitus, diabetic neuropathy, CVA who was recently discharged from the hospital on 08/31/2019 after she had left AKA for calcaneal osteomyelitis, patient was discharged on oral doxycycline for right heel ulcer with stop date on Sep 18, 2019.  She also had right leg drug-eluting stent placed per vascular surgery.  Patient was started on dual antiplatelet therapy.    Clinical Impression  Patient very weak requiring Max assist to sit up at bedside, unable to stand due to RLE weakness and foot pain with pressure.  Patient able to maintain sitting balance supporting self with BUE and put back to bed with Max/total assist to reposition.  Patient will benefit from continued physical therapy in hospital and recommended venue below to increase strength, balance, endurance for safe ADLs and gait.    Follow Up Recommendations SNF    Equipment Recommendations  None recommended by PT    Recommendations for Other Services       Precautions / Restrictions Precautions Precautions: Fall Precaution Comments: L AKA Restrictions Weight Bearing Restrictions: Yes LLE Weight Bearing: Non weight bearing Other Position/Activity Restrictions: PRAFO RLE      Mobility  Bed Mobility Overal bed mobility: Needs Assistance Bed Mobility: Supine to Sit;Sit to Supine     Supine to sit: Max assist Sit to supine: Max assist   General bed mobility comments: slow labored movement with c/o severe pain with pressure to BLE  Transfers                    Ambulation/Gait                Stairs            Wheelchair Mobility    Modified Rankin (Stroke  Patients Only)       Balance Overall balance assessment: Needs assistance Sitting-balance support: Feet supported;No upper extremity supported Sitting balance-Leahy Scale: Poor Sitting balance - Comments: fair/poor without BUE support, fair with BUE support                                     Pertinent Vitals/Pain Pain Assessment: Faces Faces Pain Scale: Hurts even more Pain Location: BLE with pressure to legs Pain Descriptors / Indicators: Grimacing;Guarding Pain Intervention(s): Limited activity within patient's tolerance;Monitored during session;Repositioned    Home Living Family/patient expects to be discharged to:: Private residence Living Arrangements: Spouse/significant other Available Help at Discharge: Family;Available 24 hours/day Type of Home: Apartment Home Access: Level entry     Home Layout: One level Home Equipment: Walker - 4 wheels;Walker - 2 wheels;Wheelchair - Brewing technologist      Prior Function Level of Independence: Needs assistance   Gait / Transfers Assistance Needed: was household ambulator prior to left AKA  ADL's / Homemaking Assistance Needed: Husband and family assist with ADLs, aide 3x/week for 4 hours/day to assist with ADLs        Hand Dominance   Dominant Hand: Right    Extremity/Trunk Assessment   Upper Extremity Assessment Upper Extremity Assessment: Generalized weakness    Lower Extremity Assessment Lower Extremity Assessment: Generalized weakness;LLE deficits/detail;RLE deficits/detail RLE Deficits / Details:  grossly 2+/5 RLE: Unable to fully assess due to pain RLE Sensation: WNL RLE Coordination: WNL LLE Deficits / Details: grossly -2/5 LLE: Unable to fully assess due to pain    Cervical / Trunk Assessment Cervical / Trunk Assessment: Normal  Communication   Communication: No difficulties  Cognition Arousal/Alertness: Awake/alert Behavior During Therapy: WFL for tasks assessed/performed Overall  Cognitive Status: Within Functional Limits for tasks assessed                                 General Comments: Patient apprehensive, requires encouragement      General Comments      Exercises     Assessment/Plan    PT Assessment Patient needs continued PT services  PT Problem List Decreased strength;Decreased activity tolerance;Decreased balance;Decreased mobility       PT Treatment Interventions Gait training;Functional mobility training;Therapeutic activities;Therapeutic exercise;Wheelchair mobility training;Patient/family education;Balance training    PT Goals (Current goals can be found in the Care Plan section)  Acute Rehab PT Goals Patient Stated Goal: return home with family to assist PT Goal Formulation: With patient Time For Goal Achievement: 10/04/19 Potential to Achieve Goals: Fair    Frequency Min 3X/week   Barriers to discharge        Co-evaluation               AM-PAC PT "6 Clicks" Mobility  Outcome Measure Help needed turning from your back to your side while in a flat bed without using bedrails?: A Lot Help needed moving from lying on your back to sitting on the side of a flat bed without using bedrails?: A Lot Help needed moving to and from a bed to a chair (including a wheelchair)?: Total Help needed standing up from a chair using your arms (e.g., wheelchair or bedside chair)?: Total Help needed to walk in hospital room?: Total Help needed climbing 3-5 steps with a railing? : Total 6 Click Score: 8    End of Session   Activity Tolerance: Patient tolerated treatment well;Patient limited by fatigue Patient left: in bed;with call bell/phone within reach;with bed alarm set Nurse Communication: Mobility status PT Visit Diagnosis: Unsteadiness on feet (R26.81);Other abnormalities of gait and mobility (R26.89);Muscle weakness (generalized) (M62.81);Pain    Time: 9532-0233 PT Time Calculation (min) (ACUTE ONLY): 24  min   Charges:   PT Evaluation $PT Eval Moderate Complexity: 1 Mod PT Treatments $Therapeutic Activity: 23-37 mins        10:47 AM, 09/20/19 Lonell Grandchild, MPT Physical Therapist with [facility] [phone] office [phone] mobile phone

## 2019-09-21 DIAGNOSIS — K2100 Gastro-esophageal reflux disease with esophagitis, without bleeding: Secondary | ICD-10-CM

## 2019-09-21 LAB — BASIC METABOLIC PANEL
Anion gap: 12 (ref 5–15)
BUN: 21 mg/dL (ref 8–23)
CO2: 26 mmol/L (ref 22–32)
Calcium: 8.8 mg/dL — ABNORMAL LOW (ref 8.9–10.3)
Chloride: 98 mmol/L (ref 98–111)
Creatinine, Ser: 7.31 mg/dL — ABNORMAL HIGH (ref 0.44–1.00)
GFR calc Af Amer: 6 mL/min — ABNORMAL LOW (ref >60)
GFR calc non Af Amer: 5 mL/min — ABNORMAL LOW (ref >60)
Glucose, Bld: 127 mg/dL — ABNORMAL HIGH (ref 70–99)
Potassium: 3.7 mmol/L (ref 3.5–5.1)
Sodium: 136 mmol/L (ref 135–145)

## 2019-09-21 LAB — GLUCOSE, CAPILLARY
Glucose-Capillary: 85 mg/dL (ref 70–99)
Glucose-Capillary: 146 mg/dL — ABNORMAL HIGH (ref 70–99)
Glucose-Capillary: 87 mg/dL (ref 70–99)
Glucose-Capillary: 112 mg/dL — ABNORMAL HIGH (ref 70–99)

## 2019-09-21 LAB — HEPATITIS B SURFACE ANTIGEN: Hepatitis B Surface Ag: NONREACTIVE

## 2019-09-21 MED ORDER — APIXABAN 5 MG PO TABS
5.0000 mg | ORAL_TABLET | Freq: Two times a day (BID) | ORAL | Status: DC
  Administered 2019-09-22 – 2019-09-25 (×6): 5 mg via ORAL
  Filled 2019-09-21 (×7): qty 1

## 2019-09-21 NOTE — Procedures (Signed)
   PATIENT DATA: WEIGHT: 90.4 kg  HEIGHT:  5' 6''   SB PASSAGE TIME: 3 H 68m  RESULTS: LIMITED views of gastric mucosa due to retained contents.  PROBABLE AVM SEEN AT 53:57. No ULCERS, OR masses seen.  LIMITED VIEWS OF THE COLON DUE TO RETAINED CONTENTS. No old blood or fresh blood in the stomach, small bowel, or colon.  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 OK TO START ELIQUIS. 2. MONITOR FOR BLEEDING. CONSIDER ENTEROSCOPY/APC IF PT BLEEDS. 3. CHECK CBC IN ONE MONTH. 4. OPV IN 4 MOS

## 2019-09-21 NOTE — Progress Notes (Signed)
PROGRESS NOTE    Alexis [name]  OFB:[id number] DOB: 09-09-1945 DOA: 09/12/2019 PCP: System, Provider Not In    Brief Narrative:  74 year old female with a history of atrial fibrillation on anticoagulation, end-stage renal disease, coronary artery disease, peripheral vascular disease, diabetes, was recently discharged from the hospital on 08/31/19 after she underwent left above-the-knee amputation and was started on dual antiplatelet therapy for drug-eluting stent placed in her right lower extremity.  She was admitted to the hospital with severe hypoglycemia.  She was cool and clammy and hemodialysis.  Further work-up showed that she was hypotensive and guaiac positive.  She was admitted for further work-up of GI bleeding. -Patient is now awaiting placement to SNF rehab  Assessment & Plan:   Active Problems:   Anemia in chronic kidney disease (CKD)   Type II diabetes mellitus with manifestations (HCC)   ESRD on dialysis [facility])   Atrial fibrillation with rapid ventricular response (HCC)   Osteomyelitis (HCC)   UGI bleed   Melena   Hx of AKA (above knee amputation), left (HCC)   Pressure injury of skin   Hypotension   Hypoglycemia   Anxiety state   Palliative care by specialist   Goals of care, counseling/discussion   Gastric polyp  1)GI bleed.  Patient had guaiac positive stools.  She is on chronic Eliquis and dual antiplatelet therapy with aspirin and Plavix.  Eliquis currently on hold.    She was seen by gastroenterology and underwent EGD on 5/2 that did not show any signs of active bleeding.  Patient was unable to swallow agile capsule today.  Dr. Gala Romney placed patency agile capsule on 5/5 endoscopically, Repeat EGD with capsule placement on 09/20/2019  ---EGD shows:-     Diffuse moderate inflammation characterized by congestion (edema) and  erythema was found in the entire examined stomach. 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 -Gastroenterologist recommends restarting Eliquis  -Diet advanced  2)Anemia of chronic kidney disease.  With concerns about GI bleed, hemoglobin currently stable at 9.2   3)Altered mental status.  Secondary to hypoglycemia.  She received D50.  Overall blood sugars have improved.  Mental status is also improved.  4)Atrial fibrillation without ventricular response.  Chronically on amiodarone.  Eliquis to be restarted on 09/22/2027.  She was treated with amiodarone infusion.  Heart rates are now improved and she is back on oral amiodarone.  Discussed with cardiology, Dr. Domenic Polite regarding risks and benefits of continuing Eliquis.  Currently, her CHADSVASc score is 7, putting her at a 15.7% risk of stroke/TIA.  She recently had a stroke and 05/2019, making her high risk for recurrence.  5)End-stage renal disease on hemodialysis, Tuesday, Thursday, Saturday.  Nephrology following.  6)Hypotension--- Patient has chronic hypotension and is on midodrine.  Continue midodrine at 10 mg 3 times daily.  Overall blood pressures are improved.Marland Kitchen  7)Right lower extremity chronic osteomyelitis/left AKA stump---   serous drainage  from left AKA stump,  Treated with doxycycline last dose 09/20/2019.  Follow-up with orthopedics.  8)Diabetes--- Started on sliding scale insulin.  Blood sugars have been stable.  9)Peripheral vascular disease.  Recently had drug-eluting stent placed in the right superficial femoral-popliteal artery on 08/22/2019.  She is on dual antiplatelet therapy.  Discussed with Dr. Doren Custard on-call for vascular surgery who agreed with stopping aspirin for now, but  requested the Plavix be continued due to recent stent.  NB!!! 10) Anticoagulation---.  This is a complicated situation.  Patient will be at risk for bleeding with anticoagulation, although her  risk of stroke is also very high.  Palliative care has initiated discussions regarding goals of care, patient/family wish to continue full scope of care at this time.  Plan is to try the patient back on Eliquis once GI work-up is complete and monitor for any bleeding recurrence.  11)Distal esophagitis with overlying exudate almost certainly chemical induced (medication)--- liquid diet and Convert as many pills/ capsules to the elixir form as feasible. -Doxycycline discontinued, Protonix changed to liquid  12) dementia--- patient with underlying memory and cognitive deficits  13)Ambulatory Dysfunction due to left AKA/generalized weakness----PT eval appreciated ,awaiting SNF rehab  DVT prophylaxis: SCDs Code Status: Full code Family Communication:  D/w  daughter (POA)-- [phone]---   Disposition Plan: Status is: Inpatient--- discussed with patient POA/daughter, also discussed with patient's husband----they are all requesting transfer to SNF rehab   Dispo: The patient is from: SNF              Anticipated d/c is to: SNF              Anticipated d/c date is: When SNF bed is available     -Patient is medically stable may transfer to SNF rehab when bed available  Consultants:   Gastroenterology  Nephrology  General surgery for central line placement  Palliative care  Procedures:  -Hemodialysis on TTS  5/1 right femoral vein central line, removed on 5/4 5/2 EGD: Normal hypopharynx.                           - Normal proximal esophagus and mid esophagus.                           - Mucosal coating at distal esophagus. ? candida                            esophagits. ? dissolved pill. Cells for cytology                            obtained.                           - Z-line irregular, 39 cm from the incisors.                           - A single gastric polyp.                           - Erythematous mucosa in the antrum.                           - Normal duodenal bulb.                            - A single non-bleeding angiodysplastic lesion in                            the duodenum  -09/18/19-EGD with agile patency capsule  placement  Antimicrobials:       Subjective: -Husband at bedside, tolerating oral intake well -Had hemodialysis on 09/21/2019  Objective: Vitals:   09/21/19 1345 09/21/19 1400 09/21/19 1415 09/21/19 1430  BP: (!) 91/41 (!) 90/40 (!) 90/41 (!) 94/45  Pulse: 75 71 67 71  Resp:  18    Temp:      TempSrc:      SpO2:      Weight:      Height:        Intake/Output Summary (Last 24 hours) at 09/21/2019 1437 Last data filed at 09/21/2019 0900 Gross per 24 hour  Intake 490 ml  Output --  Net 490 ml   Filed Weights   09/19/19 1253 09/21/19 0540 09/21/19 1330  Weight: 88 kg 90.4 kg 90.4 kg    Examination:  General exam: Alert, awake, in no acute distress respiratory system: Clear to auscultation. Respiratory effort normal. Cardiovascular system: Irregular. No murmurs, rubs, gallops. Gastrointestinal system: Abdomen is nondistended, soft and nontender. No organomegaly or masses felt. Normal bowel sounds heard. Central nervous system: Alert and oriented.  Generalized weakness, no focal neurological deficits. Extremities: Left above-the-knee amputation--- staples intact and serous drainage Skin: No rashes, lesions or ulcers Psychiatry: Baseline Memory and cognitive deficits MSK=-right forearm AV fistula with positive thrill and bruit   Data Reviewed:   CBC: Recent Labs  Lab 09/15/19 0347 09/16/19 0405 09/17/19 1221 09/18/19 0430 09/20/19 0439  WBC 6.4 7.2 7.0 7.0 6.1  HGB 8.9* 8.5* 8.7* 8.5* 9.2*  HCT 28.5* 26.8* 27.4* 26.5* 28.4*  MCV 81.7 81.7 82.0 81.3 81.8  PLT 176 185 168 136* 220*   Basic Metabolic Panel: Recent Labs  Lab 09/15/19 0347 09/16/19 0405 09/18/19 0430 09/19/19 0856 09/21/19 1153  NA 134* 135 135 137 136  K 3.2* 3.3* 3.6 3.6 3.7  CL 95* 95* 98 97* 98  CO2 26 27 29 27 26   GLUCOSE 80 117* 145* 96  127*  BUN 13 21 16  24* 21  CREATININE 3.82* 5.88* 4.55* 6.92* 7.31*  CALCIUM 8.6* 8.8* 8.5* 8.9 8.8*  PHOS  --  4.0 2.5 4.6  --    GFR: Estimated Creatinine Clearance: 7.8 mL/min (A) (by C-G formula based on SCr of 7.31 mg/dL (H)). Liver Function Tests: Recent Labs  Lab 09/16/19 0405 09/18/19 0430 09/19/19 0856  ALBUMIN 2.8* 3.3* 3.2*   No results for input(s): LIPASE, AMYLASE in the last 168 hours. No results for input(s): AMMONIA in the last 168 hours. Coagulation Profile: No results for input(s): INR, PROTIME in the last 168 hours. Cardiac Enzymes: No results for input(s): CKTOTAL, CKMB, CKMBINDEX, TROPONINI in the last 168 hours. BNP (last 3 results) No results for input(s): PROBNP in the last 8760 hours. HbA1C: No results for input(s): HGBA1C in the last 72 hours. CBG: Recent Labs  Lab 09/20/19 1635 09/20/19 1706 09/20/19 2122 09/21/19 0718 09/21/19 1112  GLUCAP 142* 101* 105* 85 146*   Lipid Profile: No results for input(s): CHOL, HDL, LDLCALC, TRIG, CHOLHDL, LDLDIRECT in the last 72 hours. Thyroid Function Tests: No results for input(s): TSH, T4TOTAL, FREET4, T3FREE, THYROIDAB in the last 72 hours. Anemia Panel: No results for input(s): VITAMINB12, FOLATE, FERRITIN, TIBC, IRON, RETICCTPCT in the last 72 hours. Sepsis Labs: No results for input(s): PROCALCITON, LATICACIDVEN in the last 168 hours.  Recent Results (from the past 240 hour(s))  Respiratory Panel by RT PCR (Flu A&B, Covid) - Nasopharyngeal Swab     Status: None   Collection Time:  09/12/19 10:19 PM   Specimen: Nasopharyngeal Swab  Result Value Ref Range Status   SARS Coronavirus 2 by RT PCR NEGATIVE NEGATIVE Final    Comment: (NOTE) SARS-CoV-2 target nucleic acids are NOT DETECTED. The SARS-CoV-2 RNA is generally detectable in upper respiratoy specimens during the acute phase of infection. The lowest concentration of SARS-CoV-2 viral copies this assay can detect is 131 copies/mL. A negative  result does not preclude SARS-Cov-2 infection and should not be used as the sole basis for treatment or other patient management decisions. A negative result may occur with  improper specimen collection/handling, submission of specimen other than nasopharyngeal swab, presence of viral mutation(s) within the areas targeted by this assay, and inadequate number of viral copies (<131 copies/mL). A negative result must be combined with clinical observations, patient history, and epidemiological information. The expected result is Negative. Fact Sheet for Patients:  PinkCheek.be Fact Sheet for Healthcare Providers:  GravelBags.it This test is not yet ap proved or cleared by the [place] FDA and  has been authorized for detection and/or diagnosis of SARS-CoV-2 by FDA under an Emergency Use Authorization (EUA). This EUA will remain  in effect (meaning this test can be used) for the duration of the COVID-19 declaration under Section 564(b)(1) of the Act, 21 U.S.C. section 360bbb-3(b)(1), unless the authorization is terminated or revoked sooner.    Influenza A by PCR NEGATIVE NEGATIVE Final   Influenza B by PCR NEGATIVE NEGATIVE Final    Comment: (NOTE) The Xpert Xpress SARS-CoV-2/FLU/RSV assay is intended as an aid in  the diagnosis of influenza from Nasopharyngeal swab specimens and  should not be used as a sole basis for treatment. Nasal washings and  aspirates are unacceptable for Xpert Xpress SARS-CoV-2/FLU/RSV  testing. Fact Sheet for Patients: PinkCheek.be Fact Sheet for Healthcare Providers: GravelBags.it This test is not yet approved or cleared by the [place] FDA and  has been authorized for detection and/or diagnosis of SARS-CoV-2 by  FDA under an Emergency Use Authorization (EUA). This EUA will remain  in effect (meaning this test can be used) for the  duration of the  Covid-19 declaration under Section 564(b)(1) of the Act, 21  U.S.C. section 360bbb-3(b)(1), unless the authorization is  terminated or revoked. Performed at [facility], [address], [place], [place] [address]   MRSA PCR Screening     Status: None   Collection Time: 09/14/19  3:36 AM   Specimen: Nasal Mucosa; Nasopharyngeal  Result Value Ref Range Status   MRSA by PCR NEGATIVE NEGATIVE Final    Comment:        The GeneXpert MRSA Assay (FDA approved for NASAL specimens only), is one component of a comprehensive MRSA colonization surveillance program. It is not intended to diagnose MRSA infection nor to guide or monitor treatment for MRSA infections. Performed at [facility], [address], [place], [place] [address]   [place] prep     Status: None   Collection Time: 09/15/19 12:37 PM   Specimen: PATH GI Other  Result Value Ref Range Status   Specimen Description ESOPHAGUS  Final   Special Requests NONE  Final   KOH Prep   Final    NO YEAST OR FUNGAL ELEMENTS SEEN Performed at [facility], [address] [place],  [address]    Report Status 09/15/2019 FINAL  Final      Radiology Studies: DG Abd 1 View  Result Date: 09/19/2019 CLINICAL DATA:  Gastrointestinal bleeding, assess endoscopy capsule position EXAM: ABDOMEN -  1 VIEW COMPARISON:  09/19/2019 at 3:27 p.m. FINDINGS: Supine frontal view of the abdomen and pelvis excludes the right flank and hemidiaphragms by collimation. The endoscopy capsule projects over the left upper quadrant, in the expected region of the splenic flexure of the colon. No bowel obstruction. Extensive vascular calcifications. IMPRESSION: 1. Endoscopy capsule overlying left upper quadrant, likely within the distal colon. Electronically Signed   By: Randa Ngo M.D.   On: 09/19/2019 19:51   DG Abd 1 View  Result Date: 09/19/2019 CLINICAL DATA:  Endoscopy capsule. EXAM: ABDOMEN - 1 VIEW COMPARISON:  August 18, 2019. FINDINGS: The  bowel gas pattern is normal. Endoscopy capsule is seen just to the right of the lower lumbar spine. No radio-opaque calculi or other significant radiographic abnormality are seen. IMPRESSION: Endoscopy capsule is seen just to the right of the lower lumbar spine. No evidence of bowel obstruction or ileus. Electronically Signed   By: Marijo Conception M.D.   On: 09/19/2019 15:36   Scheduled Meds: . amiodarone  200 mg Oral Daily  . atorvastatin  80 mg Oral Daily  . Chlorhexidine Gluconate Cloth  6 each Topical Q0600  . Chlorhexidine Gluconate Cloth  6 each Topical Q0600  . clopidogrel  75 mg Oral Daily  . darbepoetin (ARANESP) injection - NON-DIALYSIS  100 mcg Subcutaneous Q Thu-1800  . insulin aspart  0-9 Units Subcutaneous TID WC  . midodrine  10 mg Oral TID WC  . pantoprazole sodium  40 mg Oral BID WC  . sevelamer carbonate  800 mg Oral TID WC  . sodium chloride flush  10-40 mL Intracatheter Q12H  . sodium chloride flush  3 mL Intravenous Q12H   Continuous Infusions: . sodium chloride Stopped (09/17/19 0900)  . sodium chloride    . sodium chloride    . albumin human 25 g (09/21/19 1347)  . sodium chloride       LOS: 7 days   Roxan Hockey, MD [facility]   If 7PM-7AM, please contact night-coverage [email or web address]  09/21/2019, 2:37 PM

## 2019-09-21 NOTE — Procedures (Signed)
     HEMODIALYSIS TREATMENT NOTE:  UF limited by hypotension.  SBP 90-105 for entirety of treatment despite Midodrine, Albumin 25g x2, and cooling of dialysate.  All blood was returned and hemostasis was achieved in 15 minutes. Net UF +587.   Repeatedly asked "how much longer" the treatment would be; otherwise, she was withdrawn and quiet.  Incontinent of stool and cleaned x2. Husband, Kennyth Lose, arrived at end of session and helped feed her dinner.  She tolerated solid meal, commenting that the fish "was really good."   Rockwell Alexandria, RN

## 2019-09-22 ENCOUNTER — Telehealth: Payer: Self-pay | Admitting: Gastroenterology

## 2019-09-22 LAB — CBC
HCT: 30.5 % — ABNORMAL LOW (ref 36.0–46.0)
Hemoglobin: 9.5 g/dL — ABNORMAL LOW (ref 12.0–15.0)
MCH: 25.7 pg — ABNORMAL LOW (ref 26.0–34.0)
MCHC: 31.1 g/dL (ref 30.0–36.0)
MCV: 82.7 fL (ref 80.0–100.0)
Platelets: 109 10*3/uL — ABNORMAL LOW (ref 150–400)
RBC: 3.69 MIL/uL — ABNORMAL LOW (ref 3.87–5.11)
RDW: 18.7 % — ABNORMAL HIGH (ref 11.5–15.5)
WBC: 5.8 10*3/uL (ref 4.0–10.5)
nRBC: 0 % (ref 0.0–0.2)

## 2019-09-22 LAB — GLUCOSE, CAPILLARY
Glucose-Capillary: 114 mg/dL — ABNORMAL HIGH (ref 70–99)
Glucose-Capillary: 126 mg/dL — ABNORMAL HIGH (ref 70–99)
Glucose-Capillary: 118 mg/dL — ABNORMAL HIGH (ref 70–99)
Glucose-Capillary: 124 mg/dL — ABNORMAL HIGH (ref 70–99)

## 2019-09-22 NOTE — Telephone Encounter (Signed)
NEEDS CBC IN ONE MO. OPV IN 4 MOS, Dx; ANEMIA.

## 2019-09-22 NOTE — Progress Notes (Signed)
PROGRESS NOTE    KATELYNNE REVAK  OYD:[id number] DOB: [birth year] DOA: 09/12/2019 PCP: System, Provider Not In    Brief Narrative:  74 year old female with a history of atrial fibrillation on anticoagulation, end-stage renal disease, coronary artery disease, peripheral vascular disease, diabetes, was recently discharged from the hospital on 08/31/19 after she underwent left above-the-knee amputation and was started on dual antiplatelet therapy for drug-eluting stent placed in her right lower extremity.  She was admitted to the hospital with severe hypoglycemia.  She was cool and clammy and hemodialysis.  Further work-up showed that she was hypotensive and guaiac positive.  She was admitted for further work-up of GI bleeding. -Patient is now awaiting placement to SNF rehab  Assessment & Plan:   Active Problems:   Anemia in chronic kidney disease (CKD)   Type II diabetes mellitus with manifestations (HCC)   ESRD on dialysis [facility])   Atrial fibrillation with rapid ventricular response (HCC)   Osteomyelitis (HCC)   UGI bleed   Melena   Hx of AKA (above knee amputation), left (HCC)   Pressure injury of skin   Hypotension   Hypoglycemia   Anxiety state   Palliative care by specialist   Goals of care, counseling/discussion   Gastric polyp  1)GI bleed.  Patient had guaiac positive stools.  She is on chronic Eliquis and dual antiplatelet therapy with aspirin and Plavix.  Eliquis currently on hold.    She was seen by gastroenterology and underwent EGD on 5/2 that did not show any signs of active bleeding.  Patient was unable to swallow agile capsule today.  Dr. Gala Romney placed patency agile capsule on 5/5 endoscopically, Repeat EGD with capsule placement on 09/20/2019  ---EGD shows:-     Diffuse moderate inflammation characterized by congestion (edema) and  erythema was found in the entire examined stomach. 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 -Gastroenterologist recommends restarting Eliquis  -Diet advanced  2)Anemia of chronic kidney disease.  With concerns about GI bleed, hemoglobin currently stable at 9.5   3)Altered mental status.  Secondary to hypoglycemia.  She received D50.  Overall blood sugars have improved.  Mental status is back to baseline  4)Atrial fibrillation without ventricular response.  Chronically on amiodarone.  Eliquis  restarted on 09/22/2027.  She was treated with amiodarone infusion.  Heart rates are now improved and she is back on oral amiodarone.  Discussed with cardiology, Dr. Domenic Polite regarding risks and benefits of continuing Eliquis.  Currently, her CHADSVASc score is 7, putting her at a 15.7% risk of stroke/TIA.  She recently had a stroke and 05/2019, making her high risk for recurrence.  5)End-stage renal disease on hemodialysis, Tuesday, Thursday, Saturday.  Nephrology following.  6)Hypotension--- Patient has chronic hypotension and is on midodrine.  Continue midodrine at 10 mg 3 times daily.  Overall blood pressures are improved.Marland Kitchen  7)Right lower extremity chronic osteomyelitis/left AKA stump--- Treated with doxycycline last dose 09/20/2019 for right foot osteomyelitis- Follow-up with orthopedics. -  serous drainage  from left AKA stump-awaiting wound culture results from left AKA stump obtained on 09/21/2019  8)Diabetes--- Started on sliding scale insulin.  Blood sugars have been stable.  9)Peripheral vascular disease.  Recently had drug-eluting stent placed in the right superficial femoral-popliteal artery on 08/22/2019.  She is on dual antiplatelet therapy.  Discussed with Dr. Doren Custard  on-call for vascular surgery who agreed with stopping aspirin for now, but requested the Plavix be continued due to recent stent. -Eliquis restarted 09/22/2019  NB!!! 10)  Anticoagulation---.  This is a complicated situation.  Patient will be at risk for bleeding with anticoagulation, although her risk of stroke is also very high.  Palliative care has initiated discussions regarding goals of care, patient/family wish to continue full scope of care at this time.   \--Eliquis restarted 09/22/2019  11)Distal esophagitis with overlying exudate almost certainly chemical induced (medication)--- soft diet advised and Convert as many pills/ capsules to the elixir form as feasible.  12) dementia--- patient with underlying memory and cognitive deficits  13)Ambulatory Dysfunction due to left AKA/generalized weakness----PT eval appreciated ,awaiting SNF rehab  DVT prophylaxis: SCDs Code Status: Full code Family Communication:  D/w  daughter (POA)-- [phone]---   Disposition Plan: Status is: Inpatient--- discussed with patient POA/daughter, also discussed with patient's husband----they are all requesting transfer to SNF rehab   Dispo: The patient is from: SNF              Anticipated d/c is to: SNF              Anticipated d/c date is: When SNF bed is available     -Patient is medically stable may transfer to SNF rehab when bed available  Consultants:   Gastroenterology  Nephrology  General surgery for central line placement  Palliative care  Procedures:  -Hemodialysis on TTS  5/1 right femoral vein central line, removed on 5/4 5/2 EGD: Normal hypopharynx.                           - Normal proximal esophagus and mid esophagus.                           - Mucosal coating at distal esophagus. ? candida                            esophagits. ? dissolved pill. Cells for cytology                            obtained.                           - Z-line irregular, 39 cm from the incisors.                           - A single gastric polyp.                           - Erythematous mucosa in the antrum.                           - Normal duodenal bulb.                            - A single non-bleeding angiodysplastic lesion in                            the duodenum  -09/18/19-EGD with agile patency capsule placement  Antimicrobials:  Subjective: -Patient was reluctant to take her pills and her husband was able to convince her to take pills, eating okay  Objective: Vitals:   09/21/19 1715 09/21/19 1730 09/22/19 0446 09/22/19 1331  BP: (!) 99/41 (!) 100/51 (!) 114/52 (!) 122/57  Pulse: 74 70 77 84  Resp:  16 16 16   Temp:  98 F (36.7 C) 98.4 F (36.9 C) 99.1 F (37.3 C)  TempSrc:  Oral Axillary Oral  SpO2:   100% 100%  Weight:      Height:        Intake/Output Summary (Last 24 hours) at 09/22/2019 1859 Last data filed at 09/22/2019 1700 Gross per 24 hour  Intake 720 ml  Output --  Net 720 ml   Filed Weights   09/19/19 1253 09/21/19 0540 09/21/19 1330  Weight: 88 kg 90.4 kg 90.4 kg    Examination:  General exam: Alert, awake, in no acute distress respiratory system: Clear to auscultation. Respiratory effort normal. Cardiovascular system: Irregular. No murmurs, rubs, gallops. Gastrointestinal system: Abdomen is nondistended, soft and nontender. No organomegaly or masses felt. Normal bowel sounds heard. Central nervous system: Alert and oriented.  Generalized weakness, no focal neurological deficits. Extremities: Left above-the-knee amputation--- staples intact and serous drainage -Right foot/ heel osteomyelitis dressing on Skin: No rashes, lesions or ulcers Psychiatry: Baseline Memory and cognitive deficits MSK=-right forearm AV fistula with positive thrill and bruit  Data Reviewed:   CBC: Recent Labs  Lab 09/16/19 0405 09/17/19 1221 09/18/19 0430 09/20/19 0439 09/22/19 0644  WBC 7.2 7.0 7.0 6.1 5.8  HGB 8.5* 8.7* 8.5* 9.2* 9.5*  HCT 26.8* 27.4* 26.5* 28.4* 30.5*  MCV 81.7 82.0 81.3 81.8 82.7  PLT 185 168 136* 113* 616*   Basic Metabolic Panel: Recent Labs  Lab 09/16/19 0405 09/18/19 0430 09/19/19 0856  09/21/19 1153  NA 135 135 137 136  K 3.3* 3.6 3.6 3.7  CL 95* 98 97* 98  CO2 27 29 27 26   GLUCOSE 117* 145* 96 127*  BUN 21 16 24* 21  CREATININE 5.88* 4.55* 6.92* 7.31*  CALCIUM 8.8* 8.5* 8.9 8.8*  PHOS 4.0 2.5 4.6  --    GFR: Estimated Creatinine Clearance: 7.8 mL/min (A) (by C-G formula based on SCr of 7.31 mg/dL (H)). Liver Function Tests: Recent Labs  Lab 09/16/19 0405 09/18/19 0430 09/19/19 0856  ALBUMIN 2.8* 3.3* 3.2*   No results for input(s): LIPASE, AMYLASE in the last 168 hours. No results for input(s): AMMONIA in the last 168 hours. Coagulation Profile: No results for input(s): INR, PROTIME in the last 168 hours. Cardiac Enzymes: No results for input(s): CKTOTAL, CKMB, CKMBINDEX, TROPONINI in the last 168 hours. BNP (last 3 results) No results for input(s): PROBNP in the last 8760 hours. HbA1C: No results for input(s): HGBA1C in the last 72 hours. CBG: Recent Labs  Lab 09/21/19 1608 09/21/19 2046 09/22/19 0717 09/22/19 1116 09/22/19 1637  GLUCAP 87 112* 114* 126* 118*   Lipid Profile: No results for input(s): CHOL, HDL, LDLCALC, TRIG, CHOLHDL, LDLDIRECT in the last 72 hours. Thyroid Function Tests: No results for input(s): TSH, T4TOTAL, FREET4, T3FREE, THYROIDAB in the last 72 hours. Anemia Panel: No results for input(s): VITAMINB12, FOLATE, FERRITIN, TIBC, IRON, RETICCTPCT in the last 72 hours. Sepsis Labs: No results for input(s): PROCALCITON, LATICACIDVEN in the last 168 hours.  Recent Results (from the past 240 hour(s))  Respiratory Panel by RT PCR (Flu A&B, Covid) - Nasopharyngeal Swab     Status: None   Collection  Time: 09/12/19 10:19 PM   Specimen: Nasopharyngeal Swab  Result Value Ref Range Status   SARS Coronavirus 2 by RT PCR NEGATIVE NEGATIVE Final    Comment: (NOTE) SARS-CoV-2 target nucleic acids are NOT DETECTED. The SARS-CoV-2 RNA is generally detectable in upper respiratoy specimens during the acute phase of infection. The  lowest concentration of SARS-CoV-2 viral copies this assay can detect is 131 copies/mL. A negative result does not preclude SARS-Cov-2 infection and should not be used as the sole basis for treatment or other patient management decisions. A negative result may occur with  improper specimen collection/handling, submission of specimen other than nasopharyngeal swab, presence of viral mutation(s) within the areas targeted by this assay, and inadequate number of viral copies (<131 copies/mL). A negative result must be combined with clinical observations, patient history, and epidemiological information. The expected result is Negative. Fact Sheet for Patients:  PinkCheek.be Fact Sheet for Healthcare Providers:  GravelBags.it This test is not yet ap proved or cleared by the [place] FDA and  has been authorized for detection and/or diagnosis of SARS-CoV-2 by FDA under an Emergency Use Authorization (EUA). This EUA will remain  in effect (meaning this test can be used) for the duration of the COVID-19 declaration under Section 564(b)(1) of the Act, 21 U.S.C. section 360bbb-3(b)(1), unless the authorization is terminated or revoked sooner.    Influenza A by PCR NEGATIVE NEGATIVE Final   Influenza B by PCR NEGATIVE NEGATIVE Final    Comment: (NOTE) The Xpert Xpress SARS-CoV-2/FLU/RSV assay is intended as an aid in  the diagnosis of influenza from Nasopharyngeal swab specimens and  should not be used as a sole basis for treatment. Nasal washings and  aspirates are unacceptable for Xpert Xpress SARS-CoV-2/FLU/RSV  testing. Fact Sheet for Patients: PinkCheek.be Fact Sheet for Healthcare Providers: GravelBags.it This test is not yet approved or cleared by the [place] FDA and  has been authorized for detection and/or diagnosis of SARS-CoV-2 by  FDA under an Emergency  Use Authorization (EUA). This EUA will remain  in effect (meaning this test can be used) for the duration of the  Covid-19 declaration under Section 564(b)(1) of the Act, 21  U.S.C. section 360bbb-3(b)(1), unless the authorization is  terminated or revoked. Performed at [facility], [address] [place], [place] [address]   MRSA PCR Screening     Status: None   Collection Time: 09/14/19  3:36 AM   Specimen: Nasal Mucosa; Nasopharyngeal  Result Value Ref Range Status   MRSA by PCR NEGATIVE NEGATIVE Final    Comment:        The GeneXpert MRSA Assay (FDA approved for NASAL specimens only), is one component of a comprehensive MRSA colonization surveillance program. It is not intended to diagnose MRSA infection nor to guide or monitor treatment for MRSA infections. Performed at [facility], [address], [place], [place] [address]   [place] prep     Status: None   Collection Time: 09/15/19 12:37 PM   Specimen: PATH GI Other  Result Value Ref Range Status   Specimen Description ESOPHAGUS  Final   Special Requests NONE  Final   KOH Prep   Final    NO YEAST OR FUNGAL ELEMENTS SEEN Performed at [facility], [address], [place], [place] [address]    Report Status 09/15/2019 FINAL  Final  Aerobic/Anaerobic Culture (surgical/deep wound)     Status: None (Preliminary result)   Collection Time: 09/21/19  6:20 PM   Specimen: Wound  Result Value Ref Range Status   Specimen Description   Final    WOUND Performed at [facility], [address], [place], [place] [address]    Special Requests   Final    Normal Performed at [facility], [address] [place], [place] [address]    Gram Stain   Final    NO WBC SEEN MODERATE GRAM VARIABLE ROD Performed at [facility], [place] [address] [place], [place] [address]    Culture PENDING  Incomplete   Report Status PENDING  Incomplete      Radiology Studies: No results found. Scheduled Meds: . amiodarone  200 mg Oral Daily   . apixaban  5 mg Oral BID  . atorvastatin  80 mg Oral Daily  . Chlorhexidine Gluconate Cloth  6 each Topical Q0600  . Chlorhexidine Gluconate Cloth  6 each Topical Q0600  . clopidogrel  75 mg Oral Daily  . darbepoetin (ARANESP) injection - NON-DIALYSIS  100 mcg Subcutaneous Q Thu-1800  . insulin aspart  0-9 Units Subcutaneous TID WC  . midodrine  10 mg Oral TID WC  . pantoprazole sodium  40 mg Oral BID WC  . sevelamer carbonate  800 mg Oral TID WC  . sodium chloride flush  10-40 mL Intracatheter Q12H  . sodium chloride flush  3 mL Intravenous Q12H   Continuous Infusions: . sodium chloride Stopped (09/17/19 0900)  . sodium chloride    . sodium chloride    . albumin human 25 g (09/21/19 1537)  . sodium chloride       LOS: 8 days   Roxan Hockey, MD [facility]   If 7PM-7AM, please contact night-coverage [email or web address]  09/22/2019, 6:59 PM

## 2019-09-23 ENCOUNTER — Other Ambulatory Visit: Payer: Self-pay

## 2019-09-23 ENCOUNTER — Encounter: Payer: Self-pay | Admitting: Gastroenterology

## 2019-09-23 DIAGNOSIS — D649 Anemia, unspecified: Secondary | ICD-10-CM

## 2019-09-23 LAB — GLUCOSE, CAPILLARY
Glucose-Capillary: 107 mg/dL — ABNORMAL HIGH (ref 70–99)
Glucose-Capillary: 128 mg/dL — ABNORMAL HIGH (ref 70–99)
Glucose-Capillary: 107 mg/dL — ABNORMAL HIGH (ref 70–99)
Glucose-Capillary: 119 mg/dL — ABNORMAL HIGH (ref 70–99)

## 2019-09-23 NOTE — TOC Progression Note (Signed)
Transition of Care [facility]) - Progression Note    Patient Details  Name: Alexis [name] MRN: [id number] Date of Birth: 07-15-1945  Transition of Care [facility]) CM/SW Contact  Shade Flood, LCSW Phone Number: 09/23/2019, 12:47 PM  Clinical Narrative:     TOC following. Notified by MD that family now wants SNF. Called pt's POA, daughter, to discuss. Daughter states that pt was at [facility] prior to this admission. Dtr states that they do not want pt to return to [place]. She asks for new referrals. Referrals sent to requested facilities. Insurance auth started. Will follow.  Expected Discharge Plan: [place] Barriers to Discharge: Continued Medical Work up  Expected Discharge Plan and Services Expected Discharge Plan: [place] Choice: Durable Medical Equipment, Home Health Living arrangements for the past 2 months: Apartment Expected Discharge Date: 09/20/19               DME Arranged: Bedside commode, Hospital bed DME Agency: [employer] Date DME Agency Contacted: 09/18/19 Time DME Agency Contacted: [phone] Representative spoke with at DME Agency: Blake Divine HH Arranged: PT [place]: [place] Date [place]: 09/20/19 Time [place]: 1343 Representative spoke with at [place]: [place] ([place]) Interventions    Readmission Risk Interventions Readmission Risk Prevention Plan 06/12/2019  Transportation Screening Complete  PCP or Specialist Appt within 3-5 Days Not Complete  HRI or [place] Complete  Social Work Consult for [place] Planning/Counseling [place] Not Complete  Medication Review Press photographer) Complete  Some recent data might be hidden

## 2019-09-23 NOTE — NC FL2 (Signed)
MEDICAID FL2 LEVEL OF CARE SCREENING TOOL     IDENTIFICATION  Patient Name: Alexis [name] Birthdate: 1945-09-15 Sex: female Admission Date (Current Location): 09/12/2019  [facility] and [place] Number:  [employer] and Address:  [place] [address] [place]      Provider Number: [id number]  Attending Physician Name and Address:  Roxan Hockey, MD  Relative Name and Phone Number:       Current Level of Care: Hospital Recommended Level of Care: [place] Prior Approval Number:    Date Approved/Denied:   PASRR Number: [id number]  Discharge Plan: SNF    Current Diagnoses: Patient Active Problem List   Diagnosis Date Noted  . Gastric polyp   . Hypoglycemia   . Anxiety state   . Palliative care by specialist   . Goals of care, counseling/discussion   . Pressure injury of skin 09/14/2019  . Hypotension 09/14/2019  . UGI bleed 09/13/2019  . Melena   . Hx of AKA (above knee amputation), left ([place])   . Gangrene of left foot ([place])   . 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 PVD (peripheral vascular disease) ([place])   . Osteomyelitis ([place]) 08/18/2019  . Diabetic foot ulcers ([place]) 08/18/2019  . Emesis 08/18/2019  . Altered mental status 06/11/2019  . Generalized weakness 06/11/2019  . Acute delirium 06/11/2019  . Hallucinations 06/11/2019  . Acute CVA (cerebrovascular accident) ([place]) 06/11/2019  . Hypokalemia 05/12/2019  . Hyperlipidemia   . Pulmonary hypertension ([place])   . GERD (gastroesophageal reflux disease)   . Stable angina (HCC)   . Atrial fibrillation with rapid ventricular response ([place]) 05/09/2019  . Elevated troponin   . ESRD on dialysis ([place])   . Dyspnea 08/13/2017  . Chest pain 08/04/2017  . 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 Essential hypertension 08/15/2012  . CAD (coronary artery disease) 03/25/2011  . Type II diabetes  mellitus with manifestations ([place]) 03/25/2011  . Chest pain 03/25/2011  . CHF (congestive heart failure) ([place]) 10/14/2010  . Anemia in chronic kidney disease (CKD) 10/14/2010  . Obesity 10/14/2010    Orientation RESPIRATION BLADDER Height & Weight     Self  Normal Incontinent Weight: 199 lb 4.7 oz (90.4 kg) Height:  5\' 6"  (167.6 cm)  BEHAVIORAL SYMPTOMS/MOOD NEUROLOGICAL BOWEL NUTRITION STATUS      Incontinent Diet(see dc summary)  AMBULATORY STATUS COMMUNICATION OF NEEDS Skin   Extensive Assist Verbally PU Stage and Appropriate Care   PU Stage 2 Dressing: (Stage 2 L and R buttocks, foam dressing)                   Personal Care Assistance Level of Assistance  Bathing Bathing Assistance: Maximum assistance Feeding assistance: Limited assistance Dressing Assistance: Maximum assistance     Functional Limitations Info    Sight Info: Adequate Hearing Info: Adequate Speech Info: Adequate    SPECIAL CARE FACTORS FREQUENCY        PT Frequency: 5x week OT Frequency: 3x week            Contractures Contractures Info: Not present    Additional Factors Info    Code Status Info: Full Allergies Info: Olmesartan           Current Medications (09/23/2019):  This is the current hospital active medication list Current Facility-Administered Medications  Medication Dose Route Frequency Provider Last Rate Last Admin  .  0.9 %  sodium chloride infusion  250 mL Intravenous PRN Danie Binder, MD   Stopped at 09/17/19 0900  . 0.9 %  sodium chloride infusion  100 mL Intravenous PRN Fields, Sandi L, MD      . 0.9 %  sodium chloride infusion  100 mL Intravenous PRN Fields, Sandi L, MD      . acetaminophen (TYLENOL) tablet 650 mg  650 mg Oral Q6H PRN Fields, Sandi L, MD       Or  . acetaminophen (TYLENOL) suppository 650 mg  650 mg Rectal Q6H PRN Fields, Sandi L, MD      . albumin human 25 % solution 25 g  25 g Intravenous BID PRN Fields, Sandi L, MD 60 mL/hr at 09/21/19 1537  25 g at 09/21/19 1537  . albuterol (PROVENTIL) (2.5 MG/3ML) 0.083% nebulizer solution 3 mL  3 mL Inhalation Q6H PRN Fields, Sandi L, MD      . ALPRAZolam Duanne Moron) tablet 0.25 mg  0.25 mg Oral QHS PRN [place] Alar, Sandi L, MD   0.25 mg at 09/21/19 2158  . amiodarone (PACERONE) tablet 200 mg  200 mg Oral Daily Fields, Sandi L, MD   200 mg at 09/23/19 6606  . apixaban (ELIQUIS) tablet 5 mg  5 mg Oral BID Roxan Hockey, MD   5 mg at 09/23/19 0923  . atorvastatin (LIPITOR) tablet 80 mg  80 mg Oral Daily Fields, Sandi L, MD   80 mg at 09/23/19 3016  . Chlorhexidine Gluconate Cloth 2 % PADS 6 each  6 each Topical Q0600 Danie Binder, MD   6 each at 09/20/19 [phone]  . Chlorhexidine Gluconate Cloth 2 % PADS 6 each  6 each Topical Q0600 Danie Binder, MD   6 each at 09/20/19 [phone]  . clopidogrel (PLAVIX) tablet 75 mg  75 mg Oral Daily Fields, Sandi L, MD   75 mg at 09/23/19 5732  . Darbepoetin Alfa (ARANESP) injection 100 mcg  100 mcg Subcutaneous Q Thu-1800 Fields, Sandi L, MD      . insulin aspart (novoLOG) injection 0-9 Units  0-9 Units Subcutaneous TID WC Danie Binder, MD   1 Units at 09/23/19 1224  . lidocaine (PF) (XYLOCAINE) 1 % injection 5 mL  5 mL Intradermal PRN Fields, Sandi L, MD      . lidocaine-prilocaine (EMLA) cream 1 application  1 application Topical PRN Fields, Sandi L, MD      . midodrine (PROAMATINE) tablet 10 mg  10 mg Oral TID WC Fields, Sandi L, MD   10 mg at 09/23/19 1224  . ondansetron (ZOFRAN) tablet 4 mg  4 mg Oral Q6H PRN Fields, Sandi L, MD       Or  . ondansetron (ZOFRAN) injection 4 mg  4 mg Intravenous Q6H PRN Barney Drain L, MD   4 mg at 09/15/19 0750  . pantoprazole sodium (PROTONIX) 40 mg/20 mL oral suspension 40 mg  40 mg Oral BID WC Emokpae, Courage, MD   40 mg at 09/23/19 0921  . pentafluoroprop-tetrafluoroeth (GEBAUERS) aerosol 1 application  1 application Topical PRN Fields, Sandi L, MD      . promethazine (PHENERGAN) injection 6.25 mg  6.25 mg Intravenous Q6H PRN  Fields, Sandi L, MD   6.25 mg at 09/14/19 1018  . sevelamer carbonate (RENVELA) tablet 800 mg  800 mg Oral TID WC Fields, Sandi L, MD   800 mg at 09/23/19 1224  . sodium chloride 0.9 % bolus 500 mL  500 mL Intravenous Once Fields, Sandi L, MD      . sodium chloride flush (NS) 0.9 % injection 10-40 mL  10-40 mL Intracatheter Q12H Fields, Sandi L, MD   10 mL at 09/20/19 2259  . sodium chloride flush (NS) 0.9 % injection 10-40 mL  10-40 mL Intracatheter PRN Barney Drain L, MD   10 mL at 09/15/19 2112  . sodium chloride flush (NS) 0.9 % injection 3 mL  3 mL Intravenous Q12H Fields, Sandi L, MD   3 mL at 09/20/19 2259  . sodium chloride flush (NS) 0.9 % injection 3 mL  3 mL Intravenous PRN Fields, Marga Melnick, MD         Discharge Medications: Please see discharge summary for a list of discharge medications.  Relevant Imaging Results:  Relevant Lab Results:   Additional Information SSN: [id number], HD patient  Shade Flood, LCSW

## 2019-09-23 NOTE — Telephone Encounter (Signed)
Spoke with pts daughter. Lab orders were mailed to pts daughter. Pt will have labs completed at [facility].

## 2019-09-23 NOTE — Telephone Encounter (Signed)
PATIENT SCHEDULED AND LETTER SENT  °

## 2019-09-23 NOTE — Care Management Important Message (Signed)
Important Message  Patient Details  Name: Alexis [name] MRN: [id number] Date of Birth: [birth year]   Medicare Important Message Given:  Yes     Tommy Medal 09/23/2019, 4:07 PM

## 2019-09-23 NOTE — Telephone Encounter (Signed)
Lmom, waiting on a return call.  

## 2019-09-23 NOTE — Progress Notes (Signed)
PROGRESS NOTE    Alexis [name]  JIR:[id number] DOB: Sep 19, 1945 DOA: 09/12/2019 PCP: System, Provider Not In    Brief Narrative:  74 year old female with a history of atrial fibrillation on anticoagulation, end-stage renal disease, coronary artery disease, peripheral vascular disease, diabetes, was recently discharged from the hospital on 08/31/19 after she underwent left above-the-knee amputation and was started on dual antiplatelet therapy for drug-eluting stent placed in her right lower extremity.  She was admitted to the hospital with severe hypoglycemia.  She was cool and clammy and hemodialysis.  Further work-up showed that she was hypotensive and guaiac positive.  She was admitted for further work-up of GI bleeding. -Patient is now awaiting placement to SNF rehab  Assessment & Plan:   Active Problems:   Anemia in chronic kidney disease (CKD)   Type II diabetes mellitus with manifestations (HCC)   ESRD on dialysis [facility])   Atrial fibrillation with rapid ventricular response (HCC)   Osteomyelitis (HCC)   UGI bleed   Melena   Hx of AKA (above knee amputation), left (HCC)   Pressure injury of skin   Hypotension   Hypoglycemia   Anxiety state   Palliative care by specialist   Goals of care, counseling/discussion   Gastric polyp  1)GI bleed.  Patient had guaiac positive stools.  She is on chronic Eliquis and dual antiplatelet therapy with aspirin and Plavix.  Eliquis currently on hold.    She was seen by gastroenterology and underwent EGD on 5/2 that did not show any signs of active bleeding.  Patient was unable to swallow agile capsule today.  Dr. Gala Romney placed patency agile capsule on 5/5 endoscopically, Repeat EGD with capsule placement on 09/20/2019  ---EGD shows:-     Diffuse moderate inflammation characterized by congestion (edema) and  erythema was found in the entire examined stomach. 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 -Gastroenterologist recommends restarting Eliquis  -Diet advanced  2)Anemia of chronic kidney disease.  With concerns about GI bleed, hemoglobin currently stable at 9.5   3)Altered mental status.  Secondary to hypoglycemia.  She received D50.  Overall blood sugars have improved.  Mental status is back to baseline  4)Atrial fibrillation without ventricular response.  Chronically on amiodarone.  Eliquis  restarted on 09/22/2027.  She was treated with amiodarone infusion.  Heart rates are now improved and she is back on oral amiodarone.  Discussed with cardiology, Dr. Domenic Polite regarding risks and benefits of continuing Eliquis.  Currently, her CHADSVASc score is 7, putting her at a 15.7% risk of stroke/TIA.  She recently had a stroke and 05/2019, making her high risk for recurrence.  5)End-stage renal disease on hemodialysis, Tuesday, Thursday, Saturday.  Nephrology following.  6)Hypotension--- Patient has chronic hypotension and is on midodrine.  Continue midodrine at 10 mg 3 times daily.  Overall blood pressures are improved.Marland Kitchen  7)Right lower extremity chronic osteomyelitis/left AKA stump--- Treated with doxycycline last dose 09/20/2019 for right foot osteomyelitis- Follow-up with orthopedics. -  serous drainage  from left AKA stump-awaiting wound culture results from left AKA stump obtained on 09/21/2019  8)Diabetes--- Started on sliding scale insulin.  Blood sugars have been stable.  9)Peripheral vascular disease.  Recently had drug-eluting stent placed in the right superficial femoral-popliteal artery on 08/22/2019.  She is on dual antiplatelet therapy.  Discussed with Dr. Doren Custard  on-call for vascular surgery who agreed with stopping aspirin for now, but requested the Plavix be continued due to recent stent. -Eliquis restarted 09/22/2019  NB!!! 10)  Anticoagulation---.  This is a complicated situation.  Patient will be at risk for bleeding with anticoagulation, although her risk of stroke is also very high.  Palliative care has initiated discussions regarding goals of care, patient/family wish to continue full scope of care at this time.   \--Eliquis restarted 09/22/2019  11)Distal esophagitis with overlying exudate almost certainly chemical induced (medication)--- soft diet advised and Convert as many pills/ capsules to the elixir form as feasible.  12) dementia--- patient with underlying memory and cognitive deficits  13)Ambulatory Dysfunction due to left AKA/generalized weakness----PT eval appreciated ,awaiting SNF rehab  DVT prophylaxis: SCDs Code Status: Full code Family Communication:  D/w  daughter (POA)-- [phone]---   Disposition Plan: Status is: Inpatient--- discussed with patient POA/daughter, also discussed with patient's husband----they are all requesting transfer to SNF rehab   Dispo: The patient is from: SNF              Anticipated d/c is to: SNF              Anticipated d/c date is: When SNF bed is available     -Patient is medically stable may transfer to SNF rehab when bed available  Consultants:   Gastroenterology  Nephrology  General surgery for central line placement  Palliative care  Procedures:  -Hemodialysis on TTS  5/1 right femoral vein central line, removed on 5/4 5/2 EGD: Normal hypopharynx.                           - Normal proximal esophagus and mid esophagus.                           - Mucosal coating at distal esophagus. ? candida                            esophagits. ? dissolved pill. Cells for cytology                            obtained.                           - Z-line irregular, 39 cm from the incisors.                           - A single gastric polyp.                           - Erythematous mucosa in the antrum.                           - Normal duodenal bulb.                            - A single non-bleeding angiodysplastic lesion in                            the duodenum  -09/18/19-EGD with agile patency capsule placement  Antimicrobials:  Subjective: -Resting comfortably, no new complaints -Patient is now awaiting placement to SNF rehab  Objective: Vitals:   09/22/19 1331 09/22/19 2059 09/23/19 0451 09/23/19 1406  BP: (!) 122/57 (!) 133/48 (!) 123/44 (!) 128/41  Pulse: 84 78 74 74  Resp: 16 16 19 18   Temp: 99.1 F (37.3 C) (!) 97.2 F (36.2 C) 98.9 F (37.2 C) 98.8 F (37.1 C)  TempSrc: Oral Axillary Axillary Oral  SpO2: 100% 100% 93% 100%  Weight:      Height:        Intake/Output Summary (Last 24 hours) at 09/23/2019 1913 Last data filed at 09/23/2019 1700 Gross per 24 hour  Intake 360 ml  Output --  Net 360 ml   Filed Weights   09/19/19 1253 09/21/19 0540 09/21/19 1330  Weight: 88 kg 90.4 kg 90.4 kg    Examination:  General exam: Alert, awake, in no acute distress respiratory system: Clear to auscultation. Respiratory effort normal. Cardiovascular system: Irregular. No murmurs, rubs, gallops. Gastrointestinal system: Abdomen is nondistended, soft and nontender. No organomegaly or masses felt. Normal bowel sounds heard. Central nervous system: Alert and oriented.  Generalized weakness, no focal neurological deficits. Extremities: Left above-the-knee amputation--- staples intact and serous drainage -Right foot/ heel osteomyelitis dressing on Skin: No rashes, lesions or ulcers Psychiatry: Baseline Memory and cognitive deficits MSK=-right forearm AV fistula with positive thrill and bruit - Media Information   Document Information  Photos  Rt lateral ankle foot  09/23/2019 12:10  Attached To:  Hospital Encounter on 09/12/19  Source Information  Roxan Hockey, MD  [facility]   Media Information   Document Information  Photos  Rt Foot   09/23/2019 12:08  Attached To:  Hospital Encounter on 09/12/19   Source Information  Roxan Hockey, MD  [facility]     Data Reviewed:   CBC: Recent Labs  Lab 09/17/19 1221 09/18/19 0430 09/20/19 0439 09/22/19 0644  WBC 7.0 7.0 6.1 5.8  HGB 8.7* 8.5* 9.2* 9.5*  HCT 27.4* 26.5* 28.4* 30.5*  MCV 82.0 81.3 81.8 82.7  PLT 168 136* 113* 224*   Basic Metabolic Panel: Recent Labs  Lab 09/18/19 0430 09/19/19 0856 09/21/19 1153  NA 135 137 136  K 3.6 3.6 3.7  CL 98 97* 98  CO2 29 27 26   GLUCOSE 145* 96 127*  BUN 16 24* 21  CREATININE 4.55* 6.92* 7.31*  CALCIUM 8.5* 8.9 8.8*  PHOS 2.5 4.6  --    GFR: Estimated Creatinine Clearance: 7.8 mL/min (A) (by C-G formula based on SCr of 7.31 mg/dL (H)). Liver Function Tests: Recent Labs  Lab 09/18/19 0430 09/19/19 0856  ALBUMIN 3.3* 3.2*   No results for input(s): LIPASE, AMYLASE in the last 168 hours. No results for input(s): AMMONIA in the last 168 hours. Coagulation Profile: No results for input(s): INR, PROTIME in the last 168 hours. Cardiac Enzymes: No results for input(s): CKTOTAL, CKMB, CKMBINDEX, TROPONINI in the last 168 hours. BNP (last 3 results) No results for input(s): PROBNP in the last 8760 hours. HbA1C: No results for input(s): HGBA1C in the last 72 hours. CBG: Recent Labs  Lab 09/22/19 1637 09/22/19 2057 09/23/19 0721 09/23/19 1116 09/23/19 1631  GLUCAP 118* 124* 107* 128* 107*   Lipid Profile: No results for input(s): CHOL, HDL, LDLCALC, TRIG, CHOLHDL, LDLDIRECT in the last 72 hours. Thyroid Function Tests: No results for input(s): TSH, T4TOTAL, FREET4, T3FREE, THYROIDAB in the last 72 hours. Anemia Panel: No results for input(s): VITAMINB12, FOLATE, FERRITIN, TIBC,  IRON, RETICCTPCT in the last 72 hours. Sepsis Labs: No results for input(s): PROCALCITON, LATICACIDVEN in the last 168 hours.  Recent Results (from the past 240 hour(s))  MRSA PCR Screening     Status: None   Collection Time: 09/14/19  3:36 AM   Specimen: Nasal Mucosa; Nasopharyngeal   Result Value Ref Range Status   MRSA by PCR NEGATIVE NEGATIVE Final    Comment:        The GeneXpert MRSA Assay (FDA approved for NASAL specimens only), is one component of a comprehensive MRSA colonization surveillance program. It is not intended to diagnose MRSA infection nor to guide or monitor treatment for MRSA infections. Performed at [facility], [address], [place], [place] [address]   [place] prep     Status: None   Collection Time: 09/15/19 12:37 PM   Specimen: PATH GI Other  Result Value Ref Range Status   Specimen Description ESOPHAGUS  Final   Special Requests NONE  Final   KOH Prep   Final    NO YEAST OR FUNGAL ELEMENTS SEEN Performed at [facility], [address], [place], [place] [address]    Report Status 09/15/2019 FINAL  Final  Aerobic/Anaerobic Culture (surgical/deep wound)     Status: None (Preliminary result)   Collection Time: 09/21/19  6:20 PM   Specimen: Wound  Result Value Ref Range Status   Specimen Description   Final    WOUND Performed at [facility], [address] [place], [place] [address]    Special Requests   Final    Normal Performed at [facility], [address] [place], [place] [address]    Gram Stain   Final    NO WBC SEEN MODERATE GRAM VARIABLE ROD Performed at [facility], [place] [address] [place], [place] [address]    Culture Emmit [place] NEGATIVE RODS  Final   Report Status PENDING  Incomplete      Radiology Studies: No results found. Scheduled Meds: . amiodarone  200 mg Oral Daily  . apixaban  5 mg Oral BID  . atorvastatin  80 mg Oral Daily  . Chlorhexidine Gluconate Cloth  6 each Topical Q0600  . Chlorhexidine Gluconate Cloth  6 each Topical Q0600  . clopidogrel  75 mg Oral Daily  . darbepoetin (ARANESP) injection - NON-DIALYSIS  100 mcg Subcutaneous Q Thu-1800  . insulin aspart  0-9 Units Subcutaneous TID WC  . midodrine  10 mg Oral TID WC  . pantoprazole sodium  40 mg Oral BID WC  . sevelamer  carbonate  800 mg Oral TID WC  . sodium chloride flush  10-40 mL Intracatheter Q12H  . sodium chloride flush  3 mL Intravenous Q12H   Continuous Infusions: . sodium chloride Stopped (09/17/19 0900)  . sodium chloride    . sodium chloride    . albumin human 25 g (09/21/19 1537)  . sodium chloride       LOS: 9 days   Roxan Hockey, MD [facility]   If 7PM-7AM, please contact night-coverage [email or web address]  09/23/2019, 7:13 PM

## 2019-09-23 NOTE — Progress Notes (Signed)
Admit: 09/12/2019 LOS: 9  24F ESRD THS [facility] with AMS, melelena on DAPT+DOAC  Subjective:  . Restarted DOAC . No c/o this AM, ate entirety of breakfast . Last HD 5/8, IDH, unable to UF was net positive 0.5L  05/09 0701 - 05/10 0700 In: 720 [P.O.:720] Out: -   Filed Weights   09/19/19 1253 09/21/19 0540 09/21/19 1330  Weight: 88 kg 90.4 kg 90.4 kg    Scheduled Meds: . amiodarone  200 mg Oral Daily  . apixaban  5 mg Oral BID  . atorvastatin  80 mg Oral Daily  . Chlorhexidine Gluconate Cloth  6 each Topical Q0600  . Chlorhexidine Gluconate Cloth  6 each Topical Q0600  . clopidogrel  75 mg Oral Daily  . darbepoetin (ARANESP) injection - NON-DIALYSIS  100 mcg Subcutaneous Q Thu-1800  . insulin aspart  0-9 Units Subcutaneous TID WC  . midodrine  10 mg Oral TID WC  . pantoprazole sodium  40 mg Oral BID WC  . sevelamer carbonate  800 mg Oral TID WC  . sodium chloride flush  10-40 mL Intracatheter Q12H  . sodium chloride flush  3 mL Intravenous Q12H   Continuous Infusions: . sodium chloride Stopped (09/17/19 0900)  . sodium chloride    . sodium chloride    . albumin human 25 g (09/21/19 1537)  . sodium chloride     PRN Meds:.sodium chloride, sodium chloride, sodium chloride, acetaminophen **OR** acetaminophen, albumin human, albuterol, ALPRAZolam, lidocaine (PF), lidocaine-prilocaine, ondansetron **OR** ondansetron (ZOFRAN) IV, pentafluoroprop-tetrafluoroeth, promethazine, sodium chloride flush, sodium chloride flush  Current Labs: reviewed    Physical Exam:  Blood pressure (!) 123/44, pulse 74, temperature 98.9 F (37.2 C), temperature source Axillary, resp. rate 19, height '5\' 6"'$  (1.676 m), weight 90.4 kg, SpO2 93 %. NAD, awake lying in bed RRR CTAB No sig LEE< feet bandaged AVG RFA +B/T RUE  A 1. 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 Melena, EGD 5/2, neg for acute blood loss; neg eval, restarted DOAC; per GI,  3. Anemia: Hb 9s, CTM; rec ESA 5/6 4. CKD-BMD: sevelamer  reduced, P ok 5. 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 AMS, stable 7. Hypotension: on chornic midodrine   P . Cont HD THS schedule AVG, 3.5h, no heparin, 4K, 1L alb support UF SBP > 95 . Medication Issues; o Preferred narcotic agents for pain control are hydromorphone, fentanyl, and methadone. Morphine should not be used.  o Baclofen should be avoided o Avoid oral sodium phosphate and magnesium citrate based laxatives / bowel preps    Pearson Grippe MD 09/23/2019, 9:56 AM  Recent Labs  Lab 09/18/19 0430 09/19/19 0856 09/21/19 1153  NA 135 137 136  K 3.6 3.6 3.7  CL 98 97* 98  CO2 '29 27 26  '$ GLUCOSE 145* 96 127*  BUN 16 24* 21  CREATININE 4.55* 6.92* 7.31*  CALCIUM 8.5* 8.9 8.8*  PHOS 2.5 4.6  --    Recent Labs  Lab 09/18/19 0430 09/20/19 0439 09/22/19 0644  WBC 7.0 6.1 5.8  HGB 8.5* 9.2* 9.5*  HCT 26.5* 28.4* 30.5*  MCV 81.3 81.8 82.7  PLT 136* 113* 109*

## 2019-09-24 LAB — GLUCOSE, CAPILLARY
Glucose-Capillary: 100 mg/dL — ABNORMAL HIGH (ref 70–99)
Glucose-Capillary: 123 mg/dL — ABNORMAL HIGH (ref 70–99)
Glucose-Capillary: 100 mg/dL — ABNORMAL HIGH (ref 70–99)
Glucose-Capillary: 71 mg/dL (ref 70–99)

## 2019-09-24 LAB — CBC
HCT: 29.9 % — ABNORMAL LOW (ref 36.0–46.0)
Hemoglobin: 9.4 g/dL — ABNORMAL LOW (ref 12.0–15.0)
MCH: 26 pg (ref 26.0–34.0)
MCHC: 31.4 g/dL (ref 30.0–36.0)
MCV: 82.6 fL (ref 80.0–100.0)
Platelets: 117 10*3/uL — ABNORMAL LOW (ref 150–400)
RBC: 3.62 MIL/uL — ABNORMAL LOW (ref 3.87–5.11)
RDW: 18.7 % — ABNORMAL HIGH (ref 11.5–15.5)
WBC: 6.3 10*3/uL (ref 4.0–10.5)
nRBC: 0 % (ref 0.0–0.2)

## 2019-09-24 LAB — BASIC METABOLIC PANEL
Anion gap: 15 (ref 5–15)
BUN: 29 mg/dL — ABNORMAL HIGH (ref 8–23)
CO2: 26 mmol/L (ref 22–32)
Calcium: 9.4 mg/dL (ref 8.9–10.3)
Chloride: 97 mmol/L — ABNORMAL LOW (ref 98–111)
Creatinine, Ser: 7.67 mg/dL — ABNORMAL HIGH (ref 0.44–1.00)
GFR calc Af Amer: 6 mL/min — ABNORMAL LOW (ref >60)
GFR calc non Af Amer: 5 mL/min — ABNORMAL LOW (ref >60)
Glucose, Bld: 109 mg/dL — ABNORMAL HIGH (ref 70–99)
Potassium: 4 mmol/L (ref 3.5–5.1)
Sodium: 138 mmol/L (ref 135–145)

## 2019-09-24 LAB — SURGICAL PATHOLOGY

## 2019-09-24 LAB — SARS CORONAVIRUS 2 BY RT PCR (HOSPITAL ORDER, PERFORMED IN ~~LOC~~ HOSPITAL LAB): SARS Coronavirus 2: NEGATIVE

## 2019-09-24 MED ORDER — DARBEPOETIN ALFA 100 MCG/0.5ML IJ SOSY
100.0000 ug | PREFILLED_SYRINGE | Freq: Once | INTRAMUSCULAR | Status: AC
  Administered 2019-09-24: 100 ug via SUBCUTANEOUS
  Filled 2019-09-24: qty 0.5

## 2019-09-24 MED ORDER — CEFDINIR 300 MG PO CAPS
300.0000 mg | ORAL_CAPSULE | Freq: Once | ORAL | Status: DC
  Filled 2019-09-24: qty 1

## 2019-09-24 MED ORDER — CEFDINIR 300 MG PO CAPS
300.0000 mg | ORAL_CAPSULE | ORAL | Status: DC
  Filled 2019-09-24: qty 1

## 2019-09-24 NOTE — TOC Progression Note (Signed)
Transition of Care [facility]) - Progression Note    Patient Details  Name: Alexis [name] MRN: [id number] Date of Birth: 1945-10-24  Transition of Care [facility]) CM/SW Contact  Ihor Gully, LCSW Phone Number: 09/24/2019, 5:55 PM  Clinical Narrative:    Patient is authorized, Ref # [id number]. Authorization [id number], 3 days 07/25/19-07/27/19, [place] is [employer], fax is [phone].   Expected Discharge Plan: Skilled Nursing Facility Barriers to Discharge: Insurance Authorization  Expected Discharge Plan and Services Expected Discharge Plan: [place] Acute Care Choice: Durable Medical Equipment, Home Health Living arrangements for the past 2 months: Apartment Expected Discharge Date: 09/20/19               DME Arranged: Bedside commode, Hospital bed DME Agency: [employer] Date DME Agency Contacted: 09/18/19 Time DME Agency Contacted: [phone] Representative spoke with at DME Agency: [place]: PT [place]: [place] Date [place]: 09/20/19 Time [place]: [place] Representative spoke with at [place]: [place] ([place]) Interventions    Readmission Risk Interventions Readmission Risk Prevention Plan 06/12/2019  Transportation Screening Complete  PCP or Specialist Appt within 3-5 Days Not Complete  HRI or [place] Complete  Social Work Consult for [place] Planning/Counseling [place] Not Complete  Medication Review Press photographer) Complete  Some recent data might be hidden

## 2019-09-24 NOTE — Progress Notes (Signed)
PROGRESS NOTE    Alexis [name]  WJX:[id number] DOB: [birth year] DOA: 09/12/2019 PCP: System, Provider Not In    Brief Narrative:  74 year old female with a history of atrial fibrillation on anticoagulation, end-stage renal disease, coronary artery disease, peripheral vascular disease, diabetes, was recently discharged from the hospital on 08/31/19 after she underwent left above-the-knee amputation and was started on dual antiplatelet therapy for drug-eluting stent placed in her right lower extremity.  She was admitted to the hospital with severe hypoglycemia.  She was cool and clammy and hemodialysis.  Further work-up showed that she was hypotensive and guaiac positive.  She was admitted for further work-up of GI bleeding. -Patient is now awaiting placement to SNF rehab  Assessment & Plan:   Active Problems:   Anemia in chronic kidney disease (CKD)   Type II diabetes mellitus with manifestations (HCC)   ESRD on dialysis [facility])   Atrial fibrillation with rapid ventricular response (HCC)   Osteomyelitis (HCC)   UGI bleed   Melena   Hx of AKA (above knee amputation), left (HCC)   Pressure injury of skin   Hypotension   Hypoglycemia   Anxiety state   Palliative care by specialist   Goals of care, counseling/discussion   Gastric polyp  1)GI bleed.  Patient had guaiac positive stools.  She is on chronic Eliquis and dual antiplatelet therapy with aspirin and Plavix.  Eliquis currently on hold.    She was seen by gastroenterology and underwent EGD on 5/2 that did not show any signs of active bleeding.  Patient was unable to swallow agile capsule today.  Dr. Gala Romney placed patency agile capsule on 5/5 endoscopically, Repeat EGD with capsule placement on 09/20/2019  ---EGD shows:-     Diffuse moderate inflammation characterized by congestion (edema) and  erythema was found in the entire examined stomach. 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 -Gastroenterologist recommends restarting Eliquis  -Diet advanced  2)Anemia of chronic kidney disease.  With concerns about GI bleed, hemoglobin currently stable at 9.4  3)Altered mental status.  Secondary to hypoglycemia.  She received D50.  Overall blood sugars have improved.  Mental status is back to baseline  4)Atrial fibrillation without ventricular response.  Chronically on amiodarone.  Eliquis  restarted on 09/22/2027.  She was treated with amiodarone infusion.  Heart rates are now improved and she is back on oral amiodarone.  Discussed with cardiology, Dr. Domenic Polite regarding risks and benefits of continuing Eliquis.  Currently, her CHADSVASc score is 7, putting her at a 15.7% risk of stroke/TIA.  She recently had a stroke and 05/2019, making her high risk for recurrence.  5)End-stage renal disease on hemodialysis, Tuesday, Thursday, Saturday.  Nephrology following.  6)Hypotension--- Patient has chronic hypotension and is on midodrine.  Continue midodrine at 10 mg 3 times daily.  Overall blood pressures are improved.Marland Kitchen  7)Right lower extremity chronic osteomyelitis/left AKA stump--- Treated with doxycycline last dose 09/20/2019 for right foot osteomyelitis- Follow-up with orthopedics. -  serous drainage  from left AKA stump- wound culture from 09/21/2019 with Citrobacter freundi--- treat empirically with Omnicef adjusted for renal function   8)Diabetes--- Started on sliding scale insulin.  Blood sugars have been stable.  9)Peripheral vascular disease-- Recently had drug-eluting stent placed in the right superficial femoral-popliteal artery on 08/22/2019.  She is on dual antiplatelet therapy.  Discussed with Dr. Doren Custard on-call for vascular surgery who agreed with stopping aspirin for now, but requested the Plavix be continued due to recent  stent. -Eliquis restarted 09/22/2019  NB!!! 10) Anticoagulation---.  This is a complicated situation.  Patient will be at risk for bleeding with anticoagulation, although her risk of stroke is also very high.  Palliative care has initiated discussions regarding goals of care, patient/family wish to continue full scope of care at this time.   \--Eliquis restarted 09/22/2019  11)Distal esophagitis with overlying exudate almost certainly chemical induced (medication)--- soft diet advised and Convert as many pills/ capsules to the elixir form as feasible.  12) dementia--- patient with underlying memory and cognitive deficits  13)Ambulatory Dysfunction due to left AKA/generalized weakness----PT eval appreciated ,awaiting SNF rehab  DVT prophylaxis: SCDs Code Status: Full code Family Communication:  D/w  daughter (POA)-- [phone]---   Disposition Plan: Status is: Inpatient--- discussed with patient POA/daughter, also discussed with patient's husband----they are all requesting transfer to SNF rehab   Dispo: The patient is from: SNF              Anticipated d/c is to: SNF              Anticipated d/c date is: When SNF bed is available     -Patient is medically stable may transfer to SNF rehab when bed available--possibly on 09/25/2019  Consultants:   Gastroenterology  Nephrology  General surgery for central line placement  Palliative care  Procedures:  -Hemodialysis on TTS  5/1 right femoral vein central line, removed on 5/4 5/2 EGD: Normal hypopharynx.                           - Normal proximal esophagus and mid esophagus.                           - Mucosal coating at distal esophagus. ? candida                            esophagits. ? dissolved pill. Cells for cytology                            obtained.                           - Z-line irregular, 39 cm from the incisors.                           - A single gastric polyp.                           - Erythematous mucosa in  the antrum.                           - Normal duodenal bulb.                           - A single non-bleeding angiodysplastic lesion in                            the duodenum  -09/18/19-EGD with agile patency capsule  placement  Antimicrobials:       Subjective: -Resting comfortably, no new complaints -Patient is now awaiting placement to SNF rehab -Left AKA stump wound with Citrobacter started on Omnicef   Objective: Vitals:   09/23/19 2153 09/24/19 0527 09/24/19 0920 09/24/19 1422  BP: (!) 132/46 130/63  (!) 119/58  Pulse: 76 82  73  Resp: 20 20    Temp: 98.8 F (37.1 C) 98.6 F (37 C)  98.9 F (37.2 C)  TempSrc: Oral Oral  Oral  SpO2: 100% 100% 96% 100%  Weight:  85.4 kg    Height:        Intake/Output Summary (Last 24 hours) at 09/24/2019 1838 Last data filed at 09/24/2019 1700 Gross per 24 hour  Intake 720 ml  Output --  Net 720 ml   Filed Weights   09/21/19 0540 09/21/19 1330 09/24/19 0527  Weight: 90.4 kg 90.4 kg 85.4 kg    Examination:  General exam: Alert, awake, in no acute distress respiratory system: Clear to auscultation. Respiratory effort normal. Cardiovascular system: Irregular. No murmurs, rubs, gallops. Gastrointestinal system: Abdomen is nondistended, soft and nontender. No organomegaly or masses felt. Normal bowel sounds heard. Central nervous system: Alert and oriented.  Generalized weakness, no focal neurological deficits. Extremities: Left above-the-knee amputation--- staples intact and serous drainage -Right foot/ heel osteomyelitis dressing on Skin: No rashes, lesions or ulcers Psychiatry: Baseline Memory and cognitive deficits MSK=-right forearm AV fistula with positive thrill and bruit - Media Information   Document Information  Photos  Rt lateral ankle foot  09/23/2019 12:10  Attached To:  Hospital Encounter on 09/12/19  Source Information  Roxan Hockey, MD  [facility]   Media Information   Document  Information  Photos  Rt Foot   09/23/2019 12:08  Attached To:  Hospital Encounter on 09/12/19  Source Information  Roxan Hockey, MD  [facility]     Data Reviewed:   CBC: Recent Labs  Lab 09/18/19 0430 09/20/19 0439 09/22/19 0644 09/24/19 0922  WBC 7.0 6.1 5.8 6.3  HGB 8.5* 9.2* 9.5* 9.4*  HCT 26.5* 28.4* 30.5* 29.9*  MCV 81.3 81.8 82.7 82.6  PLT 136* 113* 109* 638*   Basic Metabolic Panel: Recent Labs  Lab 09/18/19 0430 09/19/19 0856 09/21/19 1153 09/24/19 0922  NA 135 137 136 138  K 3.6 3.6 3.7 4.0  CL 98 97* 98 97*  CO2 29 27 26 26   GLUCOSE 145* 96 127* 109*  BUN 16 24* 21 29*  CREATININE 4.55* 6.92* 7.31* 7.67*  CALCIUM 8.5* 8.9 8.8* 9.4  PHOS 2.5 4.6  --   --    GFR: Estimated Creatinine Clearance: 7.2 mL/min (A) (by C-G formula based on SCr of 7.67 mg/dL (H)). Liver Function Tests: Recent Labs  Lab 09/18/19 0430 09/19/19 0856  ALBUMIN 3.3* 3.2*   No results for input(s): LIPASE, AMYLASE in the last 168 hours. No results for input(s): AMMONIA in the last 168 hours. Coagulation Profile: No results for input(s): INR, PROTIME in the last 168 hours. Cardiac Enzymes: No results for input(s): CKTOTAL, CKMB, CKMBINDEX, TROPONINI in the last 168 hours. BNP (last 3 results) No results for input(s): PROBNP in the last 8760 hours. HbA1C: No results for input(s): HGBA1C in the last 72 hours. CBG: Recent Labs  Lab 09/23/19 1631 09/23/19 2156 09/24/19 0817 09/24/19 1205 09/24/19 1621  GLUCAP 107* 119* 100* 123* 100*   Lipid Profile: No results for input(s): CHOL, HDL, LDLCALC, TRIG, CHOLHDL, LDLDIRECT in the last 72 hours.  Thyroid Function Tests: No results for input(s): TSH, T4TOTAL, FREET4, T3FREE, THYROIDAB in the last 72 hours. Anemia Panel: No results for input(s): VITAMINB12, FOLATE, FERRITIN, TIBC, IRON, RETICCTPCT in the last 72 hours. Sepsis Labs: No results for input(s): PROCALCITON, LATICACIDVEN in the last 168 hours.  Recent  Results (from the past 240 hour(s))  KOH prep     Status: None   Collection Time: 09/15/19 12:37 PM   Specimen: PATH GI Other  Result Value Ref Range Status   Specimen Description ESOPHAGUS  Final   Special Requests NONE  Final   KOH Prep   Final    NO YEAST OR FUNGAL ELEMENTS SEEN Performed at [facility], [address] [place], [place] [address]    Report Status 09/15/2019 FINAL  Final  Aerobic/Anaerobic Culture (surgical/deep wound)     Status: None (Preliminary result)   Collection Time: 09/21/19  6:20 PM   Specimen: Wound  Result Value Ref Range Status   Specimen Description   Final    WOUND Performed at [facility], [address] [place], [place] [address]    Special Requests   Final    Normal Performed at [facility], [address] [place], [place] [address]    Gram Stain   Final    NO WBC SEEN MODERATE GRAM VARIABLE ROD Performed at [facility], [place] [address] [place], [place] [address]    Culture   Final    ABUNDANT CITROBACTER FREUNDII NO ANAEROBES ISOLATED; CULTURE IN PROGRESS FOR 5 DAYS    Report Status PENDING  Incomplete   Organism ID, Bacteria CITROBACTER FREUNDII  Final      Susceptibility   Citrobacter freundii - MIC*    CEFAZOLIN >=64 RESISTANT Resistant     CEFEPIME <=1 SENSITIVE Sensitive     CEFTAZIDIME <=1 SENSITIVE Sensitive     CEFTRIAXONE <=1 SENSITIVE Sensitive     CIPROFLOXACIN 0.5 SENSITIVE Sensitive     GENTAMICIN >=16 RESISTANT Resistant     IMIPENEM <=0.25 SENSITIVE Sensitive     TRIMETH/SULFA >=320 RESISTANT Resistant     PIP/TAZO <=4 SENSITIVE Sensitive     * ABUNDANT CITROBACTER FREUNDII  SARS Coronavirus 2 by RT PCR (hospital order, performed in [place] hospital lab) Nasopharyngeal Nasopharyngeal Swab     Status: None   Collection Time: 09/24/19  3:10 PM   Specimen: Nasopharyngeal Swab  Result Value Ref Range Status   SARS Coronavirus 2 NEGATIVE NEGATIVE Final    Comment: (NOTE) SARS-CoV-2 target nucleic acids  are NOT DETECTED. The SARS-CoV-2 RNA is generally detectable in upper and lower respiratory specimens during the acute phase of infection. The lowest concentration of SARS-CoV-2 viral copies this assay can detect is 250 copies / mL. A negative result does not preclude SARS-CoV-2 infection and should not be used as the sole basis for treatment or other patient management decisions.  A negative result may occur with improper specimen collection / handling, submission of specimen other than nasopharyngeal swab, presence of viral mutation(s) within the areas targeted by this assay, and inadequate number of viral copies (<250 copies / mL). A negative result must be combined with clinical observations, patient history, and epidemiological information. Fact Sheet for Patients:   StrictlyIdeas.no Fact Sheet for Healthcare Providers: BankingDealers.co.za This test is not yet approved or cleared  by the [place] FDA and has been authorized for detection and/or diagnosis of SARS-CoV-2 by FDA under an Emergency Use Authorization (EUA).  This EUA will remain in effect (meaning  this test can be used) for the duration of the COVID-19 declaration under Section 564(b)(1) of the Act, 21 U.S.C. section 360bbb-3(b)(1), unless the authorization is terminated or revoked sooner. Performed at [facility], [address], [place], [place] [address]       Radiology Studies: No results found. Scheduled Meds: . amiodarone  200 mg Oral Daily  . apixaban  5 mg Oral BID  . atorvastatin  80 mg Oral Daily  . [START ON 09/25/2019] cefdinir  300 mg Oral Q M,W,F,Su-1800  . cefdinir  300 mg Oral Once  . Chlorhexidine Gluconate Cloth  6 each Topical Q0600  . Chlorhexidine Gluconate Cloth  6 each Topical Q0600  . clopidogrel  75 mg Oral Daily  . insulin aspart  0-9 Units Subcutaneous TID WC  . midodrine  10 mg Oral TID WC  . pantoprazole sodium  40 mg Oral BID WC   . sevelamer carbonate  800 mg Oral TID WC  . sodium chloride flush  10-40 mL Intracatheter Q12H  . sodium chloride flush  3 mL Intravenous Q12H   Continuous Infusions: . sodium chloride Stopped (09/17/19 0900)  . sodium chloride    . sodium chloride    . albumin human 25 g (09/21/19 1537)  . sodium chloride       LOS: 10 days   Roxan Hockey, MD [facility]   If 7PM-7AM, please contact night-coverage [email or web address]  09/24/2019, 6:38 PM

## 2019-09-24 NOTE — Procedures (Signed)
     HEMODIALYSIS TREATMENT NOTE:  3.5 hour heparin-free dialysis completed via right forearm AVG (15g/retrograde flow).  Goal met:  1 liter removed without interruption in UF.  All blood was returned.  Hemostasis was achieved in 15 minutes.  Rockwell Alexandria, RN

## 2019-09-24 NOTE — Progress Notes (Signed)
Physical Therapy Treatment Patient Details Name: Alexis [name] MRN: [id number] DOB: 1945/10/04 Today's Date: 09/24/2019    History of Present Illness Alexis [name]  is a 74 y.o. female, with history of atrial fibrillation on anticoagulation with Eliquis, ESRD on HD TTS, CAD, pulmonary hypertension, hypertension, hyperlipidemia, type 2 diabetes mellitus, diabetic neuropathy, CVA who was recently discharged from the hospital on 08/31/2019 after she had left AKA for calcaneal osteomyelitis, patient was discharged on oral doxycycline for right heel ulcer with stop date on Sep 18, 2019.  She also had right leg drug-eluting stent placed per vascular surgery.  Patient was started on dual antiplatelet therapy.    PT Comments    Patient demonstrates slow labored movement for sitting up at bedside, able to maintain trunk control without falling forward or backwards while supporting self with BUE seated at bedside, requires Max assist to scoot forward and unable to scoot laterally or complete sit to stands due to weakness.  Patient tolerated sitting up at bedside for approximately 10 minutes before put back to bed and repositioned with Max/total assistance.  Patient will benefit from continued physical therapy in hospital and recommended venue below to increase strength, balance, endurance for safe ADLs and gait.    Follow Up Recommendations  SNF     Equipment Recommendations  None recommended by PT    Recommendations for Other Services       Precautions / Restrictions Precautions Precautions: Fall Precaution Comments: L AKA Restrictions Weight Bearing Restrictions: Yes LLE Weight Bearing: Non weight bearing Other Position/Activity Restrictions: PRAFO RLE    Mobility  Bed Mobility Overal bed mobility: Needs Assistance Bed Mobility: Supine to Sit;Sit to Supine     Supine to sit: Max assist Sit to supine: Max assist   General bed mobility comments: demonstrated slight improvement for moving  RLE during supine to sitting  Transfers                    Ambulation/Gait                 Stairs             Wheelchair Mobility    Modified Rankin (Stroke Patients Only)       Balance Overall balance assessment: Needs assistance Sitting-balance support: Feet supported;Bilateral upper extremity supported Sitting balance-Leahy Scale: Fair Sitting balance - Comments: seated at EOB, fair/poor without BUE support                                    Cognition Arousal/Alertness: Awake/alert Behavior During Therapy: WFL for tasks assessed/performed Overall Cognitive Status: Within Functional Limits for tasks assessed                                        Exercises General Exercises - Lower Extremity Ankle Circles/Pumps: Seated;AROM;Strengthening;Right;5 reps Long Arc Quad: Seated;AROM;Strengthening;Right;10 reps    General Comments        Pertinent Vitals/Pain Pain Assessment: Faces Faces Pain Scale: Hurts a little bit Pain Location: BLE with pressure to legs Pain Descriptors / Indicators: Discomfort;Grimacing Pain Intervention(s): Limited activity within patient's tolerance;Monitored during session;Repositioned    Home Living                      Prior Function  PT Goals (current goals can now be found in the care plan section) Acute Rehab PT Goals Patient Stated Goal: return home with family to assist PT Goal Formulation: With patient Time For Goal Achievement: 10/04/19 Potential to Achieve Goals: Fair Progress towards PT goals: Progressing toward goals    Frequency    Min 3X/week      PT Plan Current plan remains appropriate    Co-evaluation              AM-PAC PT "6 Clicks" Mobility   Outcome Measure  Help needed turning from your back to your side while in a flat bed without using bedrails?: A Lot Help needed moving from lying on your back to sitting on the side of  a flat bed without using bedrails?: A Lot Help needed moving to and from a bed to a chair (including a wheelchair)?: Total Help needed standing up from a chair using your arms (e.g., wheelchair or bedside chair)?: Total Help needed to walk in hospital room?: Total Help needed climbing 3-5 steps with a railing? : Total 6 Click Score: 8    End of Session   Activity Tolerance: Patient tolerated treatment well;Patient limited by fatigue Patient left: in bed;with call bell/phone within reach;with bed alarm set Nurse Communication: Mobility status PT Visit Diagnosis: Unsteadiness on feet (R26.81);Other abnormalities of gait and mobility (R26.89);Muscle weakness (generalized) (M62.81);Pain     Time: 1510-1530 PT Time Calculation (min) (ACUTE ONLY): 20 min  Charges:  $Therapeutic Activity: 8-22 mins                     3:41 PM, 09/24/19 Lonell Grandchild, MPT Physical Therapist with [facility] [phone] office [phone] mobile phone

## 2019-09-24 NOTE — Progress Notes (Signed)
Admit: 09/12/2019 LOS: 10  60F ESRD THS [facility] with AMS, melelena on DAPT+DOAC  Subjective:  Last HD on 5/8 with patient positive 587 mL that treatment.  She is still waiting on a SNF.  Never got ESA last week - nurse is at bedside and is going to give today.     Review of systems:  Denies n/v Denies shortness of breath or chest pain  States makes no urine   05/10 0701 - 05/11 0700 In: 360 [P.O.:360] Out: -   Filed Weights   09/21/19 0540 09/21/19 1330 09/24/19 0527  Weight: 90.4 kg 90.4 kg 85.4 kg    Scheduled Meds: . amiodarone  200 mg Oral Daily  . apixaban  5 mg Oral BID  . atorvastatin  80 mg Oral Daily  . Chlorhexidine Gluconate Cloth  6 each Topical Q0600  . Chlorhexidine Gluconate Cloth  6 each Topical Q0600  . clopidogrel  75 mg Oral Daily  . darbepoetin (ARANESP) injection - NON-DIALYSIS  100 mcg Subcutaneous Q Thu-1800  . insulin aspart  0-9 Units Subcutaneous TID WC  . midodrine  10 mg Oral TID WC  . pantoprazole sodium  40 mg Oral BID WC  . sevelamer carbonate  800 mg Oral TID WC  . sodium chloride flush  10-40 mL Intracatheter Q12H  . sodium chloride flush  3 mL Intravenous Q12H   Continuous Infusions: . sodium chloride Stopped (09/17/19 0900)  . sodium chloride    . sodium chloride    . albumin human 25 g (09/21/19 1537)  . sodium chloride     PRN Meds:.sodium chloride, sodium chloride, sodium chloride, acetaminophen **OR** acetaminophen, albumin human, albuterol, ALPRAZolam, lidocaine (PF), lidocaine-prilocaine, ondansetron **OR** ondansetron (ZOFRAN) IV, pentafluoroprop-tetrafluoroeth, promethazine, sodium chloride flush, sodium chloride flush  Current Labs: reviewed    Physical Exam:  Blood pressure 130/63, pulse 82, temperature 98.6 F (37 C), temperature source Oral, resp. rate 20, height 5\' 6"  (1.676 m), weight 85.4 kg, SpO2 96 %. NAD, awake lying in bed RRR CTAB and unlabored on room air No sig LEE Neuro - trouble with putting on her  mask.  Oriented to person and location of hospital but not year (2020) AVG RFA +B/T RUE  A 1. 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 Melena, EGD 5/2, neg for acute blood loss; neg eval, restarted DOAC; per GI,  3. Anemia: Hb 9s.  She was ordered ESA for 5/5 - later rescheduled for 5/6 and never actually given per charting. 4. CKD-BMD: sevelamer reduced, P ok 5. 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 AMS, stable 7. Hypotension: on chornic midodrine   P . Labs this AM are still pending  . Reordered ESA as one-time dose for 5/11 as she hasn't received yet . Cont HD THS schedule.  As per previously placed orders tx for today with AVG, 3.5h, no heparin, 4K, 1L alb support UF SBP > 95 . Medication Issues; o Preferred narcotic agents for pain control are hydromorphone, fentanyl, and methadone. Morphine should not be used.  o Baclofen should be avoided o Avoid oral sodium phosphate and magnesium citrate based laxatives / bowel preps    Claudia Desanctis, MD  09/24/2019, 9:42 AM   Recent Labs  Lab 09/18/19 0430 09/19/19 0856 09/21/19 1153  NA 135 137 136  K 3.6 3.6 3.7  CL 98 97* 98  CO2 29 27 26   GLUCOSE 145* 96 127*  BUN 16 24* 21  CREATININE 4.55* 6.92* 7.31*  CALCIUM  8.5* 8.9 8.8*  PHOS 2.5 4.6  --    Recent Labs  Lab 09/18/19 0430 09/20/19 0439 09/22/19 0644  WBC 7.0 6.1 5.8  HGB 8.5* 9.2* 9.5*  HCT 26.5* 28.4* 30.5*  MCV 81.3 81.8 82.7  PLT 136* 113* 109*

## 2019-09-25 ENCOUNTER — Other Ambulatory Visit: Payer: Self-pay | Admitting: *Deleted

## 2019-09-25 ENCOUNTER — Telehealth: Payer: Self-pay | Admitting: Internal Medicine

## 2019-09-25 DIAGNOSIS — Z992 Dependence on renal dialysis: Secondary | ICD-10-CM | POA: Diagnosis not present

## 2019-09-25 DIAGNOSIS — J449 Chronic obstructive pulmonary disease, unspecified: Secondary | ICD-10-CM | POA: Diagnosis not present

## 2019-09-25 DIAGNOSIS — I739 Peripheral vascular disease, unspecified: Secondary | ICD-10-CM

## 2019-09-25 DIAGNOSIS — I4891 Unspecified atrial fibrillation: Secondary | ICD-10-CM | POA: Diagnosis not present

## 2019-09-25 DIAGNOSIS — I5022 Chronic systolic (congestive) heart failure: Secondary | ICD-10-CM | POA: Diagnosis not present

## 2019-09-25 DIAGNOSIS — T8744 Infection of amputation stump, left lower extremity: Secondary | ICD-10-CM | POA: Diagnosis not present

## 2019-09-25 DIAGNOSIS — K922 Gastrointestinal hemorrhage, unspecified: Secondary | ICD-10-CM | POA: Diagnosis not present

## 2019-09-25 DIAGNOSIS — Z7401 Bed confinement status: Secondary | ICD-10-CM | POA: Diagnosis not present

## 2019-09-25 DIAGNOSIS — E11649 Type 2 diabetes mellitus with hypoglycemia without coma: Secondary | ICD-10-CM | POA: Diagnosis not present

## 2019-09-25 DIAGNOSIS — N2581 Secondary hyperparathyroidism of renal origin: Secondary | ICD-10-CM | POA: Diagnosis not present

## 2019-09-25 DIAGNOSIS — R2689 Other abnormalities of gait and mobility: Secondary | ICD-10-CM | POA: Diagnosis not present

## 2019-09-25 DIAGNOSIS — N186 End stage renal disease: Secondary | ICD-10-CM | POA: Diagnosis not present

## 2019-09-25 DIAGNOSIS — M6281 Muscle weakness (generalized): Secondary | ICD-10-CM | POA: Diagnosis not present

## 2019-09-25 DIAGNOSIS — L299 Pruritus, unspecified: Secondary | ICD-10-CM | POA: Diagnosis not present

## 2019-09-25 DIAGNOSIS — C50912 Malignant neoplasm of unspecified site of left female breast: Secondary | ICD-10-CM | POA: Diagnosis not present

## 2019-09-25 DIAGNOSIS — Z89612 Acquired absence of left leg above knee: Secondary | ICD-10-CM | POA: Diagnosis not present

## 2019-09-25 DIAGNOSIS — I959 Hypotension, unspecified: Secondary | ICD-10-CM | POA: Diagnosis not present

## 2019-09-25 DIAGNOSIS — Z7189 Other specified counseling: Secondary | ICD-10-CM | POA: Diagnosis not present

## 2019-09-25 DIAGNOSIS — D631 Anemia in chronic kidney disease: Secondary | ICD-10-CM | POA: Diagnosis not present

## 2019-09-25 DIAGNOSIS — K219 Gastro-esophageal reflux disease without esophagitis: Secondary | ICD-10-CM | POA: Diagnosis not present

## 2019-09-25 DIAGNOSIS — I2510 Atherosclerotic heart disease of native coronary artery without angina pectoris: Secondary | ICD-10-CM | POA: Diagnosis not present

## 2019-09-25 DIAGNOSIS — E11621 Type 2 diabetes mellitus with foot ulcer: Secondary | ICD-10-CM | POA: Diagnosis not present

## 2019-09-25 DIAGNOSIS — I5030 Unspecified diastolic (congestive) heart failure: Secondary | ICD-10-CM | POA: Diagnosis not present

## 2019-09-25 DIAGNOSIS — I132 Hypertensive heart and chronic kidney disease with heart failure and with stage 5 chronic kidney disease, or end stage renal disease: Secondary | ICD-10-CM | POA: Diagnosis not present

## 2019-09-25 DIAGNOSIS — Z794 Long term (current) use of insulin: Secondary | ICD-10-CM | POA: Diagnosis not present

## 2019-09-25 LAB — GLUCOSE, CAPILLARY
Glucose-Capillary: 96 mg/dL (ref 70–99)
Glucose-Capillary: 119 mg/dL — ABNORMAL HIGH (ref 70–99)
Glucose-Capillary: 131 mg/dL — ABNORMAL HIGH (ref 70–99)

## 2019-09-25 MED ORDER — CEFDINIR 300 MG PO CAPS: 300.0000 mg | ORAL_CAPSULE | ORAL | 0 refills | Status: AC

## 2019-09-25 MED ORDER — ACETAMINOPHEN 325 MG PO TABS: 650.0000 mg | ORAL_TABLET | Freq: Four times a day (QID) | ORAL | 1 refills | Status: AC | PRN

## 2019-09-25 MED ORDER — OXYCODONE HCL 5 MG PO TABS: 5.0000 mg | ORAL_TABLET | Freq: Four times a day (QID) | ORAL | 0 refills | Status: DC | PRN

## 2019-09-25 MED ORDER — INSULIN ASPART 100 UNIT/ML ~~LOC~~ SOLN: SUBCUTANEOUS | 11 refills | Status: DC

## 2019-09-25 MED ORDER — SENNOSIDES-DOCUSATE SODIUM 8.6-50 MG PO TABS: 2.0000 | ORAL_TABLET | Freq: Every day | ORAL | 1 refills | Status: AC

## 2019-09-25 MED ORDER — INSULIN LISPRO PROT & LISPRO (75-25 MIX) 100 UNIT/ML KWIKPEN: 8.0000 [IU] | PEN_INJECTOR | Freq: Two times a day (BID) | SUBCUTANEOUS | 1 refills | Status: AC

## 2019-09-25 MED ORDER — MIDODRINE HCL 10 MG PO TABS: 10.0000 mg | ORAL_TABLET | Freq: Three times a day (TID) | ORAL | 3 refills | Status: AC

## 2019-09-25 NOTE — Progress Notes (Signed)
Report called to [facility] SNF at this time. Spoke with nurse Becky Sax.

## 2019-09-25 NOTE — Discharge Summary (Signed)
Alexis [name], is a 74 y.o. female  DOB 1945-07-02  MRN [id number].  Admission date:  09/12/2019  Admitting Physician  Kathie Dike, MD  Discharge Date:  09/25/2019   Primary MD  System, Provider Not In  Recommendations for primary care physician for things to follow:  1) use NovoLog or Humalog insulin sliding scale as follows before each meal 3 times a day, 140-199 - 0 units, 200-250 - 0 units, 251-299 - 2 units,  300-349 - 4 units,  350 or above 6 units 2) okay to give Humalog insulin 75/25 18 units twice daily with meals if patient is more than 50% of her meal 3) patient is on Plavix and Eliquis which are blood thinners so please Avoid ibuprofen/Advil/Aleve/Motrin/Goody Powders/Naproxen/BC powders/Meloxicam/Diclofenac/Indomethacin and other Nonsteroidal anti-inflammatory medications as these will make you more likely to bleed and can cause stomach ulcers, can also cause Kidney problems.  4) hemodialysis on Tuesdays Thursdays and Saturdays--please give Omnicef 300 mg capsule after each hemodialysis session as prescribed 5) patient is to follow back up with orthopedic surgeon for recheck of her left AKA stump including staple removal--this needs to be done within the next week 6) patient's daughter Ms. Sherri Rad is primary decision maker-- Phone [phone] 7) patient needs CBC every Monday starting on 09/30/2019 for the next 3 weeks 8)She had Left heel osteomyelitis/calcaneal osteomyelitis and this was treated by left AKA on 08/25/2019 9) outpatient follow-up with orthopedic surgeon Dr. Sharol Given within 1 week advised--for left AKA wound check 73) outpatient follow-up with vascular surgeon Dr. Harold Barban --within 1 week strongly advised  Admission Diagnosis  Hypoglycemia [E16.2] GI bleed [K92.2] UGI bleed [K92.2]   Discharge Diagnosis  Hypoglycemia [E16.2] GI bleed [K92.2] UGI bleed [K92.2]    Active  Problems:   Anemia in chronic kidney disease (CKD)   Type II diabetes mellitus with manifestations ([place])   ESRD on dialysis [facility])   Atrial fibrillation with rapid ventricular response (HCC)   Osteomyelitis (HCC)   UGI bleed   Melena   Hx of AKA (above knee amputation), left (HCC)   Pressure injury of skin   Hypotension   Hypoglycemia   Anxiety state   Palliative care by specialist   Goals of care, counseling/discussion   Gastric polyp      Past Medical History:  Diagnosis Date  . Arthritis   . Cancer [facility]) 2005    left breast  . CHF (congestive heart failure) ([place])   . Chronic back pain   . Chronic kidney disease 03/2014   dialysis t/th/sa  . Coronary artery disease   . Diabetes mellitus    Type 2  . Diabetic nephropathy ([place]) 01-08-13  . Dyslipidemia 01-08-13  . ESRD on hemodialysis ([place])    Tu, Th, Sat  . Headache   . Hyperlipidemia   . Hypertension   . LBBB (left bundle branch block)   . Proteinuria 01-08-13  . Pulmonary hypertension ([place])    PA peak pressure 73 mmHg 08/05/17 echo  . Thyroid disease 01-08-13  Hyper-parathyroidism-secondary  . Wears glasses     Past Surgical History:  Procedure Laterality Date  . A/V FISTULAGRAM N/A 08/25/2017   Procedure: A/V FISTULAGRAM - Left Arm;  Surgeon: Angelia Mould, MD;  Location: [place] CV LAB;  Service: Cardiovascular;  Laterality: N/A;  . A/V FISTULAGRAM N/A 02/09/2018   Procedure: A/V [id number];  Surgeon: Elam Dutch, MD;  Location: [place] [facility];  Service: Cardiovascular;  Laterality: N/A;  . A/V FISTULAGRAM Left 04/04/2018   Procedure: A/V FISTULAGRAM;  Surgeon: Marty Heck, MD;  Location: [place] [facility];  Service: Cardiovascular;  Laterality: Left;  . A/V FISTULAGRAM Right 07/22/2019   Procedure: A/V FISTULAGRAM - Right Arm;  Surgeon: Waynetta Sandy, MD;  Location: [place] [facility] LAB;  Service: Cardiovascular;  Laterality: Right;  . ABDOMINAL AORTAGRAM N/A 08/11/2014     Procedure: ABDOMINAL Maxcine Ham;  Surgeon: Angelia Mould, MD;  Location: [facility] CATH LAB;  Service: Cardiovascular;  Laterality: N/A;  . ABDOMINAL AORTOGRAM W/LOWER EXTREMITY N/A 08/22/2019   Procedure: ABDOMINAL AORTOGRAM W/LOWER EXTREMITY;  Surgeon: Serafina Mitchell, MD;  Location: [place] CV LAB;  Service: Cardiovascular;  Laterality: N/A;  . ABDOMINAL HYSTERECTOMY    . AGILE CAPSULE N/A 09/17/2019   Procedure: AGILE CAPSULE;  Surgeon: Danie Binder, MD;  [facility];  Service: Endoscopy;  Laterality: N/A;  . AGILE CAPSULE N/A 09/18/2019   Procedure: AGILE CAPSULE;  Surgeon: Daneil Dolin, MD;  [facility];  Service: Endoscopy;  Laterality: N/A;  . AMPUTATION Left 08/25/2019   Procedure: LEFT AMPUTATION ABOVE KNEE;  Surgeon: Newt Minion, MD;  Location: [place];  Service: Orthopedics;  Laterality: Left;  . APPENDECTOMY    . Arm surgery     Left arm trauma  . AV FISTULA PLACEMENT Left 08/21/2014   Procedure: LEFT ARM ARTERIOVENOUS (AV) FISTULA CREATION ;  Surgeon: Angelia Mould, MD;  [facility];  Service: Vascular;  Laterality: Left;  . AV FISTULA PLACEMENT Right 06/13/2018   Procedure: insertion of right arm ARTERIOVENOUS (AV) gore-tex GRAFT;  Surgeon: Rosetta Posner, MD;  Location: [place];  Service: Vascular;  Laterality: Right;  . BACK SURGERY    . CATARACT EXTRACTION W/PHACO Left 12/22/2014   Procedure: CATARACT EXTRACTION PHACO AND INTRAOCULAR LENS PLACEMENT (IOC);  Surgeon: Tonny Branch, MD;  [facility]: [facility];  Service: Ophthalmology;  Laterality: Left;  CDE 13.48  . CORONARY ANGIOPLASTY  12/19/2003   inferior wall hypokinesis. ef 50%  . dialysis catheter    . ESOPHAGOGASTRODUODENOSCOPY N/A 09/15/2019   Procedure: ESOPHAGOGASTRODUODENOSCOPY (EGD);  Surgeon: Rogene Houston, MD;  [facility];  Service: Endoscopy;  Laterality: N/A;  . ESOPHAGOGASTRODUODENOSCOPY N/A 09/18/2019   Procedure: ESOPHAGOGASTRODUODENOSCOPY (EGD);  Surgeon: Daneil Dolin, MD;  [facility];  Service: Endoscopy;  Laterality: N/A;  . INSERTION OF DIALYSIS CATHETER Right 06/13/2018   Procedure: INSERTION OF DIALYSIS CATHETER, right internal jugular;  Surgeon: [place]etta Posner, MD;  Location: [place];  Service: Vascular;  Laterality: Right;  . IR FLUORO GUIDE CV LINE RIGHT  04/06/2018  . IR REMOVAL TUN CV CATH W/O FL  04/25/2018  . IR REMOVAL TUN CV CATH W/O FL  07/20/2018  . IR US GUIDE VASC ACCESS RIGHT  04/06/2018  . LIGATION OF ARTERIOVENOUS  FISTULA Left 03/18/2019   Procedure: LIGATION OF ARTERIOVENOUS  FISTULA  LEFT ARM;  Surgeon: Angelia Mould, MD;  Location: [place];  Service: Vascular;  Laterality: Left;  Marland Kitchen MASTECTOMY     Left  . PERIPHERAL VASCULAR BALLOON ANGIOPLASTY Left 08/25/2017   Procedure: PERIPHERAL VASCULAR BALLOON ANGIOPLASTY;  Surgeon: Angelia Mould, MD;  Location: [place] [facility] LAB;  Service: Cardiovascular;  Laterality: Left;  arm fistula  . PERIPHERAL VASCULAR BALLOON ANGIOPLASTY Left 02/09/2018   Procedure: PERIPHERAL VASCULAR BALLOON ANGIOPLASTY;  Surgeon: Elam Dutch, MD;  Location: [place] CV LAB;  Service: Cardiovascular;  Laterality: Left;  AV Fistula  . PERIPHERAL VASCULAR BALLOON ANGIOPLASTY Left 04/04/2018   Procedure: PERIPHERAL VASCULAR BALLOON ANGIOPLASTY;  Surgeon: Marty Heck, MD;  Location: [place] CV LAB;  Service: Cardiovascular;  Laterality: Left;  UPPER ARM FISTULA  . PERIPHERAL VASCULAR CATHETERIZATION N/A 09/16/2015   Procedure: Fistulagram;  Surgeon: [place]etta Posner, MD;  Location: [place] [facility];  Service: Cardiovascular;  Laterality: N/A;  . PERIPHERAL VASCULAR INTERVENTION  08/22/2019   Procedure: PERIPHERAL VASCULAR INTERVENTION;  Surgeon: Serafina Mitchell, MD;  Location: [place] CV LAB;  Service: Cardiovascular;;  Rt. SFA and Popliteal w/Shockwavw treatment  . SPINE SURGERY    . TONSILLECTOMY    . TRANSTHORACIC ECHOCARDIOGRAM  10/01/2010    Left ventricle: The  cavity size was mildly dilated. Wall thickness was increased in a pattern of mild LVH. Systolic function was   mildly reduced. The estimated ejection fraction was in the range  of 45% to 50%.   . TUBAL LIGATION       HPI  from the history and physical done on the day of admission:    Alexis [name]  is a 74 y.o. female, with history of atrial fibrillation on anticoagulation with Eliquis, ESRD on HD TTS, CAD, pulmonary hypertension, hypertension, hyperlipidemia, type 2 diabetes mellitus, diabetic neuropathy, CVA who was recently discharged from the hospital on 08/31/2019 after she had left AKA for calcaneal osteomyelitis, patient was discharged on oral doxycycline for right heel ulcer with stop date on Sep 18, 2019.  She also had right leg drug-eluting stent placed per vascular surgery.  Patient was started on dual antiplatelet therapy.  Today she was at hemodialysis where she was noted to have decreased responsiveness and was cool and clammy as per staff.  She was brought to the ED for further evaluation.  Patient was found to have severe hypoglycemia with blood glucose of less than 10.  She received 2 A of D50 and glucose returned to normal.  Patient became more alert and in now she is oriented x4.   Patient was hypotensive, stool guaiac was positive.  Her hemoglobin is at baseline 9.9.  GI was consulted by ED physician and recommended to keep her n.p.o. for possible EGD in a.m.  Patient denies black-colored stool. Denies vomiting any blood. Denies nausea vomiting or diarrhea. Denies abdominal pain    Hospital Course:     Brief Narrative:  74 year old female with a history of atrial fibrillation on anticoagulation, end-stage renal disease, coronary artery disease, peripheral vascular disease, diabetes, was recently discharged from the hospital on 08/31/19 after she underwent left above-the-knee amputation and was started on dual antiplatelet therapy for drug-eluting stent placed in her right  lower extremity.  She was admitted to the hospital with severe hypoglycemia.  She was cool and clammy and hemodialysis.  Further work-up showed that she was hypotensive and guaiac positive.  She was admitted for further work-up of GI bleeding. Transfer to SNF rehab  Assessment & Plan:   Active Problems:   Anemia in chronic kidney disease (CKD)  Type II diabetes mellitus with manifestations (HCC)   ESRD on dialysis [facility])   Atrial fibrillation with rapid ventricular response (HCC)   Osteomyelitis (HCC)   UGI bleed   Melena   Hx of AKA (above knee amputation), left (HCC)   Pressure injury of skin   Hypotension   Hypoglycemia   Anxiety state   Palliative care by specialist   Goals of care, counseling/discussion   Gastric polyp  A/p 1)GI bleed---Patient had guaiac positive stools. She is on chronic Eliquis and dual antiplatelet therapy with aspirin and Plavix. Eliquis currently on hold.   She was seen by gastroenterology and underwent EGD on 5/2 that did not show any signs of active bleeding.  Patient was unable to swallow agile capsule today.  Dr. Gala Romney placed patency agile capsule on 5/5 endoscopically, Repeat EGD with capsule placement on 09/20/2019  ---EGD shows:- Diffuse moderate inflammation characterized by congestion (edema) and erythema was found in the entire examined stomach. 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 -Gastroenterologist recommends restarting Eliquis  -Diet advanced  2)Anemia of chronic kidney disease.  With concerns about GI bleed, hemoglobin currently stable at 9.4  3)Altered mental status. Secondary to hypoglycemia. She received D50. Overall blood sugars have improved.  Mental status is back to baseline  4)Atrial fibrillation without  ventricular response. Chronically on amiodarone. Eliquis  restarted on 09/22/2027.  She was treated with amiodarone infusion.  Heart rates are now improved and she is back on oral amiodarone.  Discussed with cardiology, Dr. Domenic Polite regarding risks and benefits of continuing Eliquis.  Currently, her CHADSVASc score is 7, putting her at a 15.7% risk of stroke/TIA.  She recently had a stroke and 05/2019, making her high risk for recurrence.  5)End-stage renal disease on hemodialysis, Tuesday, Thursday, Saturday. Nephrology following.  6)Hypotension---Patient has chronic hypotension and is on midodrine.  Continue midodrine at 10 mg 3 times daily.  Overall blood pressures are improved.Marland Kitchen  7)Right lower extremity chronic osteomyelitis/left AKA stump--- Treated with doxycycline last dose 09/20/2019 for right foot osteomyelitis-Follow-up with orthopedics. -  serous drainage from left AKA stump- wound culture from 09/21/2019 with Citrobacter freundi--- treat empirically with Omnicef adjusted for renal function   8)Diabetes---Started on sliding scale insulin.  Blood sugars have been stable.  9)Peripheral vascular disease--Recently had drug-eluting stent placed in the right superficial femoral-popliteal artery on 08/22/2019. She was on dual antiplatelet therapy.  Discussed with Dr. Doren Custard on-call for vascular surgery who agreed with stopping aspirin for now, but requested the Plavix be continued due to recent stent. -Eliquis restarted 09/22/2019  NB!!! 10) Anticoagulation---.  This is a complicated situation.  Patient will be at risk for bleeding with anticoagulation, although her risk of stroke is also very high.  Palliative care has initiated discussions regarding goals of care, patient/family wish to continue full scope of care at this time.   \--Eliquis restarted 09/22/2019  11)Distal esophagitis with overlying exudate almost certainly chemical induced (medication)--- soft diet advised and Convert as  many pills/ capsules to the elixir form as feasible. -Protonix as prescribed  12) dementia--- patient with underlying memory and cognitive deficits--supportive care  13)Ambulatory Dysfunction due to left AKA/generalized weakness----PT eval appreciated ,transfer SNF rehab  DVT prophylaxis: SCDs Code Status: Full code Family Communication:  D/w  daughter (POA)-- [phone]---   Disposition Plan:  transfer to SNF rehab  Consultants:   Gastroenterology  Nephrology  General surgery for central line placement  Palliative care  Procedures:  -Hemodialysis on TTS  5/1 right femoral vein central line, removed on 5/4 5/2 [id number] hypopharynx. - Normal proximal esophagus and mid esophagus. - Mucosal coating at distal esophagus. ? candida  esophagits. ? dissolved pill. Cells for cytology  obtained. - Z-line irregular, 39 cm from the incisors. - A single gastric polyp. - Erythematous mucosa in the antrum. - Normal duodenal bulb. - A single non-bleeding angiodysplastic lesion in  the duodenum  -09/18/19-EGD with agile patency capsule placement  Discharge Condition: stable  Follow UP   Contact information for follow-up providers    [facility] Follow up.   Why:  DME - equipment       Newt Minion, MD. Schedule an appointment as soon as possible for a visit in 1 week(s).   Specialty: Orthopedic Surgery Contact information: [place] [address] [phone]        Serafina Mitchell, MD. Schedule an appointment as soon as possible for a visit in 1 week(s).   Specialties: Vascular Surgery, Cardiology Contact information: [address] [place] [address] [phone]              Contact information for after-discharge care    Destination    [facility] .   Service: Skilled Nursing Contact information: [address] [place] [phone]                  Diet and Activity recommendation:  As advised  Discharge Instructions    Discharge Instructions    Call MD for:  difficulty breathing, headache or visual disturbances   Complete by: As directed    Call MD for:  persistant dizziness or light-headedness   Complete by: As directed    Call MD for:  persistant nausea and vomiting   Complete by: As directed    Call MD for:  severe uncontrolled pain   Complete by: As directed    Call MD for:  temperature >100.4   Complete by: As directed    Diet - low sodium heart healthy   Complete by: As directed    Fluid restriction of less than 1200 mL/day   Diet Carb Modified   Complete by: As directed    Discharge instructions   Complete by: As directed    1) use NovoLog or Humalog insulin sliding scale as follows before each meal 3 times a day, 140-199 - 0 units, 200-250 - 0 units, 251-299 - 2 units,  300-349 - 4 units,  350 or above 6 units 2) okay to give Humalog insulin 75/25 18 units twice daily with meals if patient is more than 50% of her meal 3) patient is on Plavix and Eliquis which are blood thinners so please Avoid ibuprofen/Advil/Aleve/Motrin/Goody Powders/Naproxen/BC powders/Meloxicam/Diclofenac/Indomethacin and other Nonsteroidal anti-inflammatory medications as these will make you more likely to bleed and can cause stomach ulcers, can also cause Kidney problems.  4) hemodialysis on Tuesdays Thursdays and Saturdays--please give Omnicef 300 mg capsule after each hemodialysis session as prescribed 5) patient is to follow back up with orthopedic surgeon for recheck of her left AKA stump including staple removal--this needs to be done within the next week 6) patient's  daughter Ms. Sherri Rad is primary decision maker-- Phone [phone] 7) patient needs CBC every Monday starting on 09/30/2019 for the next 3 weeks 8)She had  Left heel osteomyelitis/calcaneal osteomyelitis and this was treated by left AKA on 08/25/2019 9) outpatient follow-up with orthopedic surgeon Dr. Sharol Given within 1 week advised--for left AKA wound check 10) outpatient follow-up with vascular surgeon Dr. Harold Barban --within 1 week strongly advised   Increase activity slowly   Complete by: As directed        Discharge Medications     Allergies as of 09/25/2019      Reactions   Olmesartan Other (See Comments)   Hyperkalemia      Medication List    STOP taking these medications   aspirin 81 MG EC tablet   docusate sodium 100 MG capsule Commonly known as: COLACE   doxycycline 100 MG tablet Commonly known as: VIBRA-TABS   NovoLOG Mix 70/30 FlexPen (70-30) 100 UNIT/ML FlexPen Generic drug: insulin aspart protamine - aspart Replaced by: Insulin Lispro Prot & Lispro (75-25) 100 UNIT/ML Kwikpen     TAKE these medications   acetaminophen 325 MG tablet Commonly known as: TYLENOL Take 2 tablets (650 mg total) by mouth every 6 (six) hours as needed for mild pain, fever or headache (or Fever >/= 101).   albuterol 108 (90 Base) MCG/ACT inhaler Commonly known as: VENTOLIN HFA Inhale 1-2 puffs into the lungs every 6 (six) hours as needed for wheezing or shortness of breath.   amiodarone 200 MG tablet Commonly known as: PACERONE Take 1 tablet (200 mg total) by mouth daily. What changed: additional instructions   apixaban 2.5 MG Tabs tablet Commonly known as: ELIQUIS Take 1 tablet (2.5 mg total) by mouth 2 (two) times daily. What changed: additional instructions   atorvastatin 80 MG tablet Commonly known as: LIPITOR Take 1 tablet (80 mg total) by mouth daily.   calcitRIOL 0.25 MCG capsule Commonly known as: ROCALTROL Take 0.25 mcg by mouth Every Tuesday,Thursday,and  Saturday with dialysis. (1700)   cefdinir 300 MG capsule Commonly known as: OMNICEF Take 1 capsule (300 mg total) by mouth every Tuesday, Thursday, and Saturday at 6 PM for 8 days. Please give after hemodialysis only--for left stump infection Start taking on: Sep 26, 2019   clopidogrel 75 MG tablet Commonly known as: PLAVIX Take 1 tablet (75 mg total) by mouth daily with breakfast.   diclofenac Sodium 1 % Gel Commonly known as: VOLTAREN Apply 2 g topically 4 (four) times daily as needed for pain.   insulin aspart 100 UNIT/ML injection Commonly known as: NovoLOG Substitute to any brand approved.Before each meal 3 times a day, 140-199 - 0 units, 200-250 - 0 units, 251-299 - 2 units,  300-349 - 4 units,  350 or above 6 units. Dispense syringes and needles as needed, Ok to switch to PEN if approved. DX DM2, Code E11.65 What changed: additional instructions   Insulin Lispro Prot & Lispro (75-25) 100 UNIT/ML Kwikpen Commonly known as: HumaLOG Mix 75/25 KwikPen Inject 8 Units into the skin 2 (two) times daily before a meal. Replaces: NovoLOG Mix 70/30 FlexPen (70-30) 100 UNIT/ML FlexPen   lidocaine-prilocaine cream Commonly known as: EMLA Apply 1 application topically Every Tuesday,Thursday,and Saturday with dialysis. 1 hour prior to dialysis   midodrine 10 MG tablet Commonly known as: PROAMATINE Take 1 tablet (10 mg total) by mouth 3 (three) times daily with meals. What changed:   medication strength  how much to take   multivitamin Tabs tablet Take 1 tablet by mouth at bedtime.   nitroGLYCERIN 0.4 MG SL tablet Commonly known as: NITROSTAT Place 0.4 mg under the tongue every 5 (  five) minutes x 3 doses as needed for chest pain.   ondansetron 4 MG disintegrating tablet Commonly known as: ZOFRAN-ODT Take 4 mg by mouth every 6 (six) hours as needed for nausea or vomiting.   oxyCODONE 5 MG immediate release tablet Commonly known as: Oxy IR/ROXICODONE Take 1 tablet (5 mg total)  by mouth every 6 (six) hours as needed for moderate pain or severe pain (pain score 4-6).   pantoprazole 40 MG tablet Commonly known as: PROTONIX Take 1 tablet (40 mg total) by mouth daily.   polyethylene glycol 17 g packet Commonly known as: MIRALAX / GLYCOLAX Take 17 g by mouth daily as needed for mild constipation.   senna-docusate 8.6-50 MG tablet Commonly known as: Senokot-S Take 2 tablets by mouth at bedtime.   sevelamer carbonate 800 MG tablet Commonly known as: RENVELA Take 1,600 mg by mouth 3 (three) times daily with meals.       Major procedures and Radiology Reports - PLEASE review detailed and final reports for all details, in brief -   DG Abd 1 View  Result Date: 09/19/2019 CLINICAL DATA:  Gastrointestinal bleeding, assess endoscopy capsule position EXAM: ABDOMEN - 1 VIEW COMPARISON:  09/19/2019 at 3:27 p.m. FINDINGS: Supine frontal view of the abdomen and pelvis excludes the right flank and hemidiaphragms by collimation. The endoscopy capsule projects over the left upper quadrant, in the expected region of the splenic flexure of the colon. No bowel obstruction. Extensive vascular calcifications. IMPRESSION: 1. Endoscopy capsule overlying left upper quadrant, likely within the distal colon. Electronically Signed   By: Randa Ngo M.D.   On: 09/19/2019 19:51   DG Abd 1 View  Result Date: 09/19/2019 CLINICAL DATA:  Endoscopy capsule. EXAM: ABDOMEN - 1 VIEW COMPARISON:  August 18, 2019. FINDINGS: The bowel gas pattern is normal. Endoscopy capsule is seen just to the right of the lower lumbar spine. No radio-opaque calculi or other significant radiographic abnormality are seen. IMPRESSION: Endoscopy capsule is seen just to the right of the lower lumbar spine. No evidence of bowel obstruction or ileus. Electronically Signed   By: Marijo Conception M.D.   On: 09/19/2019 15:36   MR FOOT RIGHT WO CONTRAST  Result Date: 08/26/2019 CLINICAL DATA:  Right heel ulcer. Prior fifth toe  amputation 4 osteomyelitis. EXAM: MRI OF THE RIGHT FOOT WITHOUT CONTRAST TECHNIQUE: Multiplanar, multisequence MR imaging of the foot was performed. No intravenous contrast was administered. Osteomyelitis protocol MRI of the foot was obtained, to include the entire foot and ankle. This protocol uses a large field of view to cover the entire foot and ankle, and is suitable for assessing bony structures for osteomyelitis. Due to the large field of view and imaging plane choice, this protocol is less sensitive for assessing small structures such as ligamentous structures of the foot and ankle, compared to a dedicated forefoot or dedicated hindfoot exam. COMPARISON:  Right foot and ankle x-rays dated August 19, 2019. FINDINGS: Bones/Joint/Cartilage No suspicious marrow signal abnormality. Prior amputation of the fifth toe and metatarsal head. No fracture or dislocation. Advanced degenerative changes of the ankle and midfoot with midfoot collapse. Small tibiotalar joint effusion. Ligaments Collateral ligaments are intact. Muscles and Tendons Flexor, peroneal and extensor compartment tendons are intact. Achilles tendon is intact with mild distal tendinosis. Complete fatty atrophy of the intrinsic foot muscles. Soft tissue Focal susceptibility artifact within the plantar soft tissues at the level of the proximal fourth metatarsal, corresponding to the thin linear foreign body seen on recent  x-ray. Additional small area of susceptibility artifact in the plantar soft tissues at the level of the second intermetatarsal space without evident foreign body seen on recent x-ray. No fluid collection or hematoma. No soft tissue mass. IMPRESSION: 1. No evidence of osteomyelitis. 2. Focal susceptibility artifact within the plantar soft tissues at the level of the proximal fourth metatarsal, corresponding to a thin linear foreign body seen on recent x-ray. 3. 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 Electronically  Signed   By: Titus Dubin M.D.   On: 08/26/2019 17:29    Micro Results    Recent Results (from the past 240 hour(s))  Aerobic/Anaerobic Culture (surgical/deep wound)     Status: None (Preliminary result)   Collection Time: 09/21/19  6:20 PM   Specimen: Wound  Result Value Ref Range Status   Specimen Description   Final    WOUND Performed at [facility], [address] [place], [place] [address]    Special Requests   Final    Normal Performed at [facility], [address], [place], [place] [address]    Gram Stain   Final    NO WBC SEEN MODERATE GRAM VARIABLE ROD Performed at [facility], [place] [address], [place],  [address]    Culture   Final    ABUNDANT CITROBACTER FREUNDII NO ANAEROBES ISOLATED; CULTURE IN PROGRESS FOR 5 DAYS    Report Status PENDING  Incomplete   Organism ID, Bacteria CITROBACTER FREUNDII  Final      Susceptibility   Citrobacter freundii - MIC*    CEFAZOLIN >=64 RESISTANT Resistant     CEFEPIME <=1 SENSITIVE Sensitive     CEFTAZIDIME <=1 SENSITIVE Sensitive     CEFTRIAXONE <=1 SENSITIVE Sensitive     CIPROFLOXACIN 0.5 SENSITIVE Sensitive     GENTAMICIN >=16 RESISTANT Resistant     IMIPENEM <=0.25 SENSITIVE Sensitive     TRIMETH/SULFA >=320 RESISTANT Resistant     PIP/TAZO <=4 SENSITIVE Sensitive     * ABUNDANT CITROBACTER FREUNDII  SARS Coronavirus 2 by RT PCR (hospital order, performed in [place] hospital lab) Nasopharyngeal Nasopharyngeal Swab     Status: None   Collection Time: 09/24/19  3:10 PM   Specimen: Nasopharyngeal Swab  Result Value Ref Range Status   SARS Coronavirus 2 NEGATIVE NEGATIVE Final    Comment: (NOTE) SARS-CoV-2 target nucleic acids are NOT DETECTED. The SARS-CoV-2 RNA is generally detectable in upper and lower respiratory specimens during the acute phase of infection. The lowest concentration of SARS-CoV-2 viral copies this assay can detect is 250 copies / mL. A negative result does not preclude  SARS-CoV-2 infection and should not be used as the sole basis for treatment or other patient management decisions.  A negative result may occur with improper specimen collection / handling, submission of specimen other than nasopharyngeal swab, presence of viral mutation(s) within the areas targeted by this assay, and inadequate number of viral copies (<250 copies / mL). A negative result must be combined with clinical observations, patient history, and epidemiological information. Fact Sheet for Patients:   StrictlyIdeas.no Fact Sheet for Healthcare Providers: BankingDealers.co.za This test is not yet approved or cleared  by the [place] FDA and has been authorized for detection and/or diagnosis of SARS-CoV-2 by FDA under an Emergency Use Authorization (EUA).  This EUA will remain in effect (meaning this test can be used) for the duration of the COVID-19 declaration under Section 564(b)(1) of the Act, 21 U.S.C. section 360bbb-3(b)(1), unless the  authorization is terminated or revoked sooner. Performed at [facility], [address], [place],  [address]     Today   Subjective    Alexis [name] today has no new complaints         No fever  Or chills  No Nausea, Vomiting or Diarrhea   Patient has been seen and examined prior to discharge   Objective   Blood pressure (!) 116/57, pulse 79, temperature 99.1 F (37.3 C), temperature source Oral, resp. rate 17, height 5\' 6"  (1.676 m), weight 85.6 kg, SpO2 100 %.   Intake/Output Summary (Last 24 hours) at 09/25/2019 1435 Last data filed at 09/25/2019 0900 Gross per 24 hour  Intake 360 ml  Output 1025 ml  Net -665 ml    Exam General exam: Alert, awake, in no acute distress respiratory system: Clear to auscultation. Respiratory effort normal. Cardiovascular system: Irregular. No murmurs, rubs, gallops. Gastrointestinal system: Abdomen is nondistended, soft and nontender.  No organomegaly or masses felt. Normal bowel sounds heard. Central nervous system: Alert and oriented.  Generalized weakness, no focal neurological deficits. Extremities: Left above-the-knee amputation--- staples intact and serous drainage -Right foot/ heel osteomyelitis dressing on--please see photos in epic Skin: No rashes, lesions or ulcers Psychiatry: Baseline Memory and cognitive deficits MSK=-right forearm AV fistula with positive thrill and bruit   Data Review   CBC w Diff:  Lab Results  Component Value Date   WBC 6.3 09/24/2019   HGB 9.4 (L) 09/24/2019   HCT 29.9 (L) 09/24/2019   PLT 117 (L) 09/24/2019   LYMPHOPCT 10 09/12/2019   MONOPCT 10 09/12/2019   EOSPCT 0 09/12/2019   BASOPCT 0 09/12/2019   CMP:  Lab Results  Component Value Date   NA 138 09/24/2019   K 4.0 09/24/2019   CL 97 (L) 09/24/2019   CO2 26 09/24/2019   BUN 29 (H) 09/24/2019   CREATININE 7.67 (H) 09/24/2019   PROT 7.3 09/13/2019   ALBUMIN 3.2 (L) 09/19/2019   BILITOT 0.9 09/13/2019   ALKPHOS 47 09/13/2019   AST 19 09/13/2019   ALT 8 09/13/2019   Total Discharge time is about 33 minutes  Roxan Hockey M.D on 09/25/2019 at 2:35 PM  Go to [email or web address] -  for contact info  [facility] - Office  [phone]

## 2019-09-25 NOTE — Progress Notes (Signed)
Admit: 09/12/2019 LOS: 11  78F ESRD THS [facility] with AMS, melelena on DAPT+DOAC  Subjective:   No interval events  HD yest 1L UF  SNF dispo in process  05/11 0701 - 05/12 0700 In: 720 [P.O.:720] Out: 1025   Filed Weights   09/21/19 1330 09/24/19 0527 09/24/19 1735  Weight: 90.4 kg 85.4 kg 85.6 kg    Scheduled Meds: . amiodarone  200 mg Oral Daily  . apixaban  5 mg Oral BID  . atorvastatin  80 mg Oral Daily  . cefdinir  300 mg Oral Q M,W,F,Su-1800  . cefdinir  300 mg Oral Once  . Chlorhexidine Gluconate Cloth  6 each Topical Q0600  . Chlorhexidine Gluconate Cloth  6 each Topical Q0600  . clopidogrel  75 mg Oral Daily  . insulin aspart  0-9 Units Subcutaneous TID WC  . midodrine  10 mg Oral TID WC  . pantoprazole sodium  40 mg Oral BID WC  . sevelamer carbonate  800 mg Oral TID WC  . sodium chloride flush  10-40 mL Intracatheter Q12H  . sodium chloride flush  3 mL Intravenous Q12H   Continuous Infusions: . sodium chloride Stopped (09/17/19 0900)  . sodium chloride    . sodium chloride    . albumin human 25 g (09/21/19 1537)  . sodium chloride     PRN Meds:.sodium chloride, sodium chloride, sodium chloride, acetaminophen **OR** acetaminophen, albumin human, albuterol, ALPRAZolam, lidocaine (PF), lidocaine-prilocaine, ondansetron **OR** ondansetron (ZOFRAN) IV, pentafluoroprop-tetrafluoroeth, promethazine, sodium chloride flush, sodium chloride flush  Current Labs: reviewed    Physical Exam:  Blood pressure (!) 116/55, pulse 72, temperature 98.7 F (37.1 C), temperature source Oral, resp. rate 16, height 5\' 6"  (1.676 m), weight 85.6 kg, SpO2 96 %. NAD, awake lying in bed RRR CTAB and unlabored on room air No sig LEE Neuro - trouble with putting on her mask.  Oriented to person and location of hospital but not year (2020) AVG RFA +B/T RUE  A 1. 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 Melena, EGD 5/2, neg for acute blood loss; neg eval, restarted DOAC; per GI,   3. Anemia: Hb 9s.  Got ESA 5/11. 4. CKD-BMD: sevelamer reduced, P ok 5. 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 AMS, stable 7. Hypotension: on chornic midodrine   P . Cont HD THS schedule.  AVG, 3.5h, no heparin, 4K, 1L UF IVB alb as needed; UF SBP > 95 . Medication Issues; o Preferred narcotic agents for pain control are hydromorphone, fentanyl, and methadone. Morphine should not be used.  o Baclofen should be avoided o Avoid oral sodium phosphate and magnesium citrate based laxatives / bowel preps    Rexene Agent, MD  09/25/2019, 11:08 AM   Recent Labs  Lab 09/19/19 0856 09/21/19 1153 09/24/19 0922  NA 137 136 138  K 3.6 3.7 4.0  CL 97* 98 97*  CO2 27 26 26   GLUCOSE 96 127* 109*  BUN 24* 21 29*  CREATININE 6.92* 7.31* 7.67*  CALCIUM 8.9 8.8* 9.4  PHOS 4.6  --   --    Recent Labs  Lab 09/20/19 0439 09/22/19 0644 09/24/19 0922  WBC 6.1 5.8 6.3  HGB 9.2* 9.5* 9.4*  HCT 28.4* 30.5* 29.9*  MCV 81.8 82.7 82.6  PLT 113* 109* 117*

## 2019-09-25 NOTE — Telephone Encounter (Signed)
Patient daughter returned your call.

## 2019-09-25 NOTE — TOC Transition Note (Signed)
Transition of Care [facility]) - CM/SW Discharge Note   Patient Details  Name: Alexis [name] MRN: [id number] Date of Birth: 03-06-1946  Transition of Care [facility]) CM/SW Contact:  Shade Flood, LCSW Phone Number: 09/25/2019, 3:00 PM   Clinical Narrative:     Pt stable for dc today per MD. Plan remains for transfer to [facility] for rehab. Spoke with Mardene Celeste at [facility] and they are prepared to take pt today. Spoke with pt's daughter, [name], to update. [name] remains in agreement. DC clinical sent electronically. RN to call report. EMS form printed to the floor and EMS called for transport.  There are no other TOC needs for dc.  Final next level of care: Skilled Nursing Facility Barriers to Discharge: Barriers Resolved   Patient Goals and CMS Choice Patient states their goals for this hospitalization and ongoing recovery are:: To go to SNF. CMS Medicare.gov Compare Post Acute Care list provided to:: Patient Represenative (must comment)(Wendy Owens Shark (daughter)) Choice offered to / list presented to : Adult Children(Wendy Owens Shark (daughter))  Discharge Placement   Existing PASRR number confirmed : 09/23/19          Patient chooses bed at: [facility] Patient to be transferred to facility by: [place] Name of family member notified: [name] (daughter) Patient and family notified of of transfer: 09/25/19  Discharge Plan and Services     Post Acute Care Choice: [place]          DME Arranged: Bedside commode, Hospital bed DME Agency: [employer] Date DME Agency Contacted: 09/18/19 Time DME Agency Contacted: [phone] Representative spoke with at DME Agency: [place]: PT [place]: [place] Date [place]: 09/20/19 Time [place]: 1343 Representative spoke with at [place]: [place] ([place]) Interventions     Readmission Risk Interventions Readmission Risk Prevention Plan  06/12/2019  Transportation Screening Complete  PCP or Specialist Appt within 3-5 Days Not Complete  HRI or [place] Complete  Social Work Consult for [place] Planning/Counseling [place] Not Complete  Medication Review Press photographer) Complete  Some recent data might be hidden

## 2019-09-25 NOTE — Care Management Important Message (Signed)
Important Message  Patient Details  Name: Alexis [name] MRN: [id number] Date of Birth: [birth year]   Medicare Important Message Given:  Yes     Tommy Medal 09/25/2019, 4:25 PM

## 2019-09-25 NOTE — Telephone Encounter (Signed)
Spoke with pts daughter. She was notified of results.

## 2019-09-26 ENCOUNTER — Telehealth: Payer: Self-pay

## 2019-09-26 DIAGNOSIS — N2581 Secondary hyperparathyroidism of renal origin: Secondary | ICD-10-CM | POA: Diagnosis not present

## 2019-09-26 DIAGNOSIS — N186 End stage renal disease: Secondary | ICD-10-CM | POA: Diagnosis not present

## 2019-09-26 DIAGNOSIS — T8744 Infection of amputation stump, left lower extremity: Secondary | ICD-10-CM | POA: Diagnosis not present

## 2019-09-26 DIAGNOSIS — Z992 Dependence on renal dialysis: Secondary | ICD-10-CM | POA: Diagnosis not present

## 2019-09-26 NOTE — Telephone Encounter (Signed)
  Patient Consent for Virtual Visit         Alexis [name] has provided verbal consent on 09/26/2019 for a virtual visit (video or telephone).   CONSENT FOR VIRTUAL VISIT FOR:  Alexis [name]  By participating in this virtual visit I agree to the following:  I hereby voluntarily request, consent and authorize [facility] and its employed or contracted physicians, physician assistants, nurse practitioners or other licensed health care professionals (the Practitioner), to provide me with telemedicine health care services (the "Services") as deemed necessary by the treating Practitioner. I acknowledge and consent to receive the Services by the Practitioner via telemedicine. I understand that the telemedicine visit will involve communicating with the Practitioner through live audiovisual communication technology and the disclosure of certain medical information by electronic transmission. I acknowledge that I have been given the opportunity to request an in-person assessment or other available alternative prior to the telemedicine visit and am voluntarily participating in the telemedicine visit.  I understand that I have the right to withhold or withdraw my consent to the use of telemedicine in the course of my care at any time, without affecting my right to future care or treatment, and that the Practitioner or I may terminate the telemedicine visit at any time. I understand that I have the right to inspect all information obtained and/or recorded in the course of the telemedicine visit and may receive copies of available information for a reasonable fee.  I understand that some of the potential risks of receiving the Services via telemedicine include:  Marland Kitchen Delay or interruption in medical evaluation due to technological equipment failure or disruption; . Information transmitted may not be sufficient (e.g. poor resolution of images) to allow for appropriate medical decision making by the Practitioner;  and/or  . In rare instances, security protocols could fail, causing a breach of personal health information.  Furthermore, I acknowledge that it is my responsibility to provide information about my medical history, conditions and care that is complete and accurate to the best of my ability. I acknowledge that Practitioner's advice, recommendations, and/or decision may be based on factors not within their control, such as incomplete or inaccurate data provided by me or distortions of diagnostic images or specimens that may result from electronic transmissions. I understand that the practice of medicine is not an exact science and that Practitioner makes no warranties or guarantees regarding treatment outcomes. I acknowledge that a copy of this consent can be made available to me via my patient portal ([place]), or I can request a printed copy by calling the office of [place].    I understand that my insurance will be billed for this visit.   I have read or had this consent read to me. . I understand the contents of this consent, which adequately explains the benefits and risks of the Services being provided via telemedicine.  . I have been provided ample opportunity to ask questions regarding this consent and the Services and have had my questions answered to my satisfaction. . I give my informed consent for the services to be provided through the use of telemedicine in my medical care

## 2019-09-27 ENCOUNTER — Telehealth (HOSPITAL_COMMUNITY): Payer: Self-pay

## 2019-09-27 ENCOUNTER — Telehealth: Payer: Medicare Other | Admitting: Cardiology

## 2019-09-27 ENCOUNTER — Telehealth: Payer: Self-pay

## 2019-09-27 LAB — AEROBIC/ANAEROBIC CULTURE W GRAM STAIN (SURGICAL/DEEP WOUND)
Gram Stain: NONE SEEN
Special Requests: NORMAL

## 2019-09-27 NOTE — Progress Notes (Unsigned)
{Choose 1 Note Type (Video or Telephone):[phone]}   The patient was identified using 2 identifiers.  Date:  09/27/2019   ID:  Alexis [name], DOB 10-25-1945, MRN [id number]  {Patient Location:[phone]::"Home"} {Provider [phone]::"Home"}  PCP:  System, Provider Not In  Cardiologist:  Dina Rich, MD *** Electrophysiologist:  None   Evaluation Performed:  {Choose Visit Type:[id number]::"Follow-Up Visit"}  Chief Complaint:  ***  History of Present Illness:    Alexis [name] is a 74 y.o. female with ***   1. Afib - new diagnosis during 04/2019 admission - - rate control complicated by soft bp's. We started amiodarone   2. CAD - s/p stenting in 2015. Records in Care Everywhere do not specify further details  3. Chronic systolic HF - fluid management per HD - does not appear volume overloaded - home hydral and imdur on hold due to soft bp's  4.RelativeHypotension - echo with stable LVEF 40-45%, mod RV dysfunction. NO clear acute cardiac etiology. - normal WBC,afebrile. No clear signs of sepsis - her coreg and hydralazine are held.  - from renal notes I believe dry weight is 98.5 kg, she is 99 kg today.Would argue against hypovolemia - random cortisol was ok    started on midodrine 5mg  tid. BP's labile but overall improved. Continue current therapy.    5. ESRD   6. GI bleed - admission 09/2019 with GI bleed - EGD Diffuse moderate inflammation characterized by congestion (edema) and erythema was found in the entire examined stomach.  7. PAD - had DES placed to right SFA 08/2019 - was on triple therapy with ASA/plavix/eliquis - ASA was stopped. Maintained on plavix.   The patient {does/does not:200015} have symptoms concerning for COVID-19 infection (fever, chills, cough, or new shortness of breath).    Past Medical History:  Diagnosis Date  . Arthritis   . Cancer [facility]) 2005    left breast  . CHF (congestive heart failure) (HCC)   .  Chronic back pain   . Chronic kidney disease 03/2014   dialysis t/th/sa  . Coronary artery disease   . Diabetes mellitus    Type 2  . Diabetic nephropathy (HCC) 01-08-13  . Dyslipidemia 01-08-13  . ESRD on hemodialysis (HCC)    Tu, Th, Sat  . Headache   . Hyperlipidemia   . Hypertension   . LBBB (left bundle Alexis [name] block)   . Proteinuria 01-08-13  . Pulmonary hypertension (HCC)    PA peak pressure 73 mmHg 08/05/17 echo  . Thyroid disease 01-08-13   Hyper-parathyroidism-secondary  . Wears glasses    Past Surgical History:  Procedure Laterality Date  . A/V FISTULAGRAM N/A 08/25/2017   Procedure: A/V FISTULAGRAM - Left Arm;  Surgeon: Chuck Hint, MD;  Location: [facility] INVASIVE CV LAB;  Service: Cardiovascular;  Laterality: N/A;  . A/V FISTULAGRAM N/A 02/09/2018   Procedure: A/V [id number];  Surgeon: Sherren Kerns, MD;  Location: [facility] INVASIVE CV LAB;  Service: Cardiovascular;  Laterality: N/A;  . A/V FISTULAGRAM Left 04/04/2018   Procedure: A/V FISTULAGRAM;  Surgeon: Cephus Shelling, MD;  Location: [facility] INVASIVE CV LAB;  Service: Cardiovascular;  Laterality: Left;  . A/V FISTULAGRAM Right 07/22/2019   Procedure: A/V FISTULAGRAM - Right Arm;  Surgeon: Maeola Harman, MD;  Location: [facility] INVASIVE CV LAB;  Service: Cardiovascular;  Laterality: Right;  . ABDOMINAL AORTAGRAM N/A 08/11/2014   Procedure: ABDOMINAL Ronny Flurry;  Surgeon: Chuck Hint, MD;  Location: [facility] CATH LAB;  Service: Cardiovascular;  Laterality: N/A;  .  ABDOMINAL AORTOGRAM W/LOWER EXTREMITY N/A 08/22/2019   Procedure: ABDOMINAL AORTOGRAM W/LOWER EXTREMITY;  Surgeon: Nada Libman, MD;  Location: [facility] INVASIVE CV LAB;  Service: Cardiovascular;  Laterality: N/A;  . ABDOMINAL HYSTERECTOMY    . AGILE CAPSULE N/A 09/17/2019   Procedure: AGILE CAPSULE;  Surgeon: West Bali, MD;  Location: [facility] ENDO SU[facility];  Service: Endoscopy;  Laterality: N/A;  . AGILE CAPSULE N/A 09/18/2019   Procedure: AGILE CAPSULE;   Surgeon: Corbin Ade, MD;  Location: [facility] END[facility];  Service: Endoscopy;  Laterality: N/A;  . AMPUTATION Left 08/25/2019   Procedure: LEFT AMPUTATION ABOVE KNEE;  Surgeon: Nadara Mustard, MD;  Location: [facility] OR;  Service: Orthopedics;  Laterality: Left;  . APPENDECTOMY    . Arm surgery     Left arm trauma  . AV FISTULA PLACEMENT Left 08/21/2014   Procedure: LEFT ARM ARTERIOVENOUS (AV) FISTULA CREATION ;  Surgeon: Chuck Hint, MD;  Location: [facility] OR;  Service: Vascular;  Laterality: Left;  . AV FISTULA PLACEMENT Right 06/13/2018   Procedure: insertion of right arm ARTERIOVENOUS (AV) gore-tex GRAFT;  Surgeon: Larina Earthly, MD;  Location: [facility] OR;  Service: Vascular;  Laterality: Right;  . BACK SURGERY    . CATARACT EXTRACTION W/PHACO Left 12/22/2014   Procedure: CATARACT EXTRACTION PHACO AND INTRAOCULAR LENS PLACEMENT (IOC);  Surgeon: Gemma Payor, MD;  Location: [facility] ORS;  Service: Ophthalmology;  Laterality: Left;  CDE 13.48  . CORONARY ANGIOPLASTY  12/19/2003   inferior wall hypokinesis. ef 50%  . dialysis catheter    . ESOPHAGOGASTRODUODENOSCOPY N/A 09/15/2019   Procedure: ESOPHAGOGASTRODUODENOSCOPY (EGD);  Surgeon: Malissa Hippo, MD;  [facility];  Service: Endoscopy;  Laterality: N/A;  . ESOPHAGOGASTRODUODENOSCOPY N/A 09/18/2019   Procedure: ESOPHAGOGASTRODUODENOSCOPY (EGD);  Surgeon: Corbin Ade, MD;  [facility];  Service: Endoscopy;  Laterality: N/A;  . ESOPHAGOGASTRODUODENOSCOPY (EGD) WITH PROPOFOL N/A 09/20/2019   Procedure: ESOPHAGOGASTRODUODENOSCOPY (EGD) WITH PROPOFOL;  Surgeon: West Bali, MD;  [facility];  Service: Endoscopy;  Laterality: N/A;  . GIVENS CAPSULE STUDY N/A 09/20/2019   Procedure: GIVENS CAPSULE STUDY;  Surgeon: West Bali, MD;  Location: [facility];  Service: Endoscopy;  Laterality: N/A;  . INSERTION OF DIALYSIS CATHETER Right 06/13/2018   Procedure: INSERTION OF DIALYSIS CATHETER, right internal jugular;  Surgeon:  Larina Earthly, MD;  Location: [facility] OR;  Service: Vascular;  Laterality: Right;  . IR FLUORO GUIDE CV LINE RIGHT  04/06/2018  . IR REMOVAL TUN CV CATH W/O FL  04/25/2018  . IR REMOVAL TUN CV CATH W/O FL  07/20/2018  . IR US GUIDE VASC ACCESS RIGHT  04/06/2018  . LIGATION OF ARTERIOVENOUS  FISTULA Left 03/18/2019   Procedure: LIGATION OF ARTERIOVENOUS  FISTULA  LEFT ARM;  Surgeon: Chuck Hint, MD;  Location: [facility] OR;  Service: Vascular;  Laterality: Left;  Marland Kitchen MASTECTOMY     Left  . PERIPHERAL VASCULAR BALLOON ANGIOPLASTY Left 08/25/2017   Procedure: PERIPHERAL VASCULAR BALLOON ANGIOPLASTY;  Surgeon: Chuck Hint, MD;  Location: [facility] INVASIVE CV LAB;  Service: Cardiovascular;  Laterality: Left;  arm fistula  . PERIPHERAL VASCULAR BALLOON ANGIOPLASTY Left 02/09/2018   Procedure: PERIPHERAL VASCULAR BALLOON ANGIOPLASTY;  Surgeon: Sherren Kerns, MD;  Location: [facility] INVASIVE CV LAB;  Service: Cardiovascular;  Laterality: Left;  AV Fistula  . PERIPHERAL VASCULAR BALLOON ANGIOPLASTY Left 04/04/2018   Procedure: PERIPHERAL VASCULAR BALLOON ANGIOPLASTY;  Surgeon: Cephus Shelling, MD;  Location: [facility] INVASIVE  CV LAB;  Service: Cardiovascular;  Laterality: Left;  UPPER ARM FISTULA  . PERIPHERAL VASCULAR CATHETERIZATION N/A 09/16/2015   Procedure: Fistulagram;  Surgeon: Larina Earthly, MD;  Location: [facility] INVASIVE CV LAB;  Service: Cardiovascular;  Laterality: N/A;  . PERIPHERAL VASCULAR INTERVENTION  08/22/2019   Procedure: PERIPHERAL VASCULAR INTERVENTION;  Surgeon: Nada Libman, MD;  Location: [facility] INVASIVE CV LAB;  Service: Cardiovascular;;  Rt. SFA and Popliteal w/Shockwavw treatment  . POLYPECTOMY  09/20/2019   Procedure: POLYPECTOMY;  Surgeon: West Bali, MD;  Location: [facility];  Service: Endoscopy;;  . SPINE SURGERY    . TONSILLECTOMY    . TRANSTHORACIC ECHOCARDIOGRAM  10/01/2010    Left ventricle: The cavity size was mildly dilated. Wall thickness was increased in a pattern of mild  LVH. Systolic function was   mildly reduced. The estimated ejection fraction was in the range  of 45% to 50%.   . TUBAL LIGATION       No outpatient medications have been marked as taking for the 09/27/19 encounter (Appointment) with Antoine Poche, MD.   Current Facility-Administered Medications for the 09/27/19 encounter (Appointment) with Antoine Poche, MD  Medication  . lidocaine (PF) (XYLOCAINE) 1 % injection 0.3 mL     Allergies:   Olmesartan   Social History   Tobacco Use  . Smoking status: Former Smoker    Packs/day: 1.00    Years: 50.00    Pack years: 50.00    Types: Cigarettes    Quit date: 05/16/1998    Years since quitting: 21.3  . Smokeless tobacco: Never Used  Substance Use Topics  . Alcohol use: No    Alcohol/week: 0.0 standard drinks  . Drug use: No     Family Hx: The patient's family history includes Breast cancer in her mother; Cancer (age of onset: 65) in her mother; Heart attack in her mother; Heart disease in her mother; Hyperlipidemia in her mother; Hypertension in her mother.  ROS:   Please see the history of present illness.    *** All other systems reviewed and are negative.   Prior CV studies:   The following studies were reviewed today:  ***  Labs/Other Tests and Data Reviewed:    EKG:  {EKG/Telemetry Strips Reviewed:[phone]}  Recent Labs: 06/11/2019: TSH 1.117 08/25/2019: Magnesium 1.8 09/13/2019: ALT 8 09/24/2019: BUN 29; Creatinine, Ser 7.67; Hemoglobin 9.4; Platelets 117; Potassium 4.0; Sodium 138   Recent Lipid Panel Lab Results  Component Value Date/Time   CHOL 62 06/12/2019 04:50 AM   TRIG 65 06/12/2019 04:50 AM   HDL 22 (L) 06/12/2019 04:50 AM   CHOLHDL 2.8 06/12/2019 04:50 AM   LDLCALC 27 06/12/2019 04:50 AM    Wt Readings from Last 3 Encounters:  09/24/19 188 lb 11.4 oz (85.6 kg)  09/04/19 188 lb (85.3 kg)  08/31/19 188 lb 11.4 oz (85.6 kg)     Objective:    Vital Signs:  There were no vitals taken for  this visit.   {[facility] Virtual Exam (Optional):[phone]::"VITAL SIGNS:  reviewed"}  ASSESSMENT & PLAN:    1. ***  COVID-19 Education: The signs and symptoms of COVID-19 were discussed with the patient and how to seek care for testing (follow up with PCP or arrange E-visit).  ***The importance of social distancing was discussed today.  Time:   Today, I have spent *** minutes with the patient with telehealth technology discussing the above problems.     Medication Adjustments/Labs and Tests Ordered: Current medicines  are reviewed at length with the patient today.  Concerns regarding medicines are outlined above.   Tests Ordered: No orders of the defined types were placed in this encounter.   Medication Changes: No orders of the defined types were placed in this encounter.   Follow Up:  {F/U [phone]} {follow up:15908}  Signed, Dina Rich, MD  09/27/2019 12:44 PM    [facility]

## 2019-09-27 NOTE — Telephone Encounter (Signed)

## 2019-09-27 NOTE — Telephone Encounter (Signed)
Telephone call received from [place] at [facility] requesting 1 wk follow up appts for pt. Informed of date/time of pts upcoming appts on 09/30/19. Voiced understanding. Minette Brine, RN

## 2019-09-28 DIAGNOSIS — T8744 Infection of amputation stump, left lower extremity: Secondary | ICD-10-CM | POA: Diagnosis not present

## 2019-09-28 DIAGNOSIS — N186 End stage renal disease: Secondary | ICD-10-CM | POA: Diagnosis not present

## 2019-09-28 DIAGNOSIS — N2581 Secondary hyperparathyroidism of renal origin: Secondary | ICD-10-CM | POA: Diagnosis not present

## 2019-09-28 DIAGNOSIS — Z992 Dependence on renal dialysis: Secondary | ICD-10-CM | POA: Diagnosis not present

## 2019-09-30 ENCOUNTER — Ambulatory Visit (HOSPITAL_COMMUNITY)
Admission: RE | Admit: 2019-09-30 | Discharge: 2019-09-30 | Disposition: A | Discharge status: Home / Self Care | Payer: No Typology Code available for payment source | Source: Ambulatory Visit | Attending: Surgery | Admitting: Surgery

## 2019-09-30 ENCOUNTER — Ambulatory Visit (INDEPENDENT_AMBULATORY_CARE_PROVIDER_SITE_OTHER): Payer: Medicare Other | Admitting: Physician Assistant

## 2019-09-30 ENCOUNTER — Ambulatory Visit (INDEPENDENT_AMBULATORY_CARE_PROVIDER_SITE_OTHER)
Admit: 2019-09-30 | Discharge: 2019-09-30 | Disposition: A | Discharge status: Home / Self Care | Payer: No Typology Code available for payment source | Attending: Surgery | Admitting: Surgery

## 2019-09-30 ENCOUNTER — Other Ambulatory Visit: Payer: Self-pay

## 2019-09-30 VITALS — BP 135/57 | HR 70 | Temp 97.3°F | Resp 16 | Ht 66.0 in | Wt 214.0 lb

## 2019-09-30 DIAGNOSIS — I739 Peripheral vascular disease, unspecified: Secondary | ICD-10-CM | POA: Diagnosis not present

## 2019-09-30 NOTE — Progress Notes (Signed)
HISTORY AND PHYSICAL     CC:  follow up. Requesting Provider:  No ref. provider found  HPI: This is a 74 y.o. Alexis [name] who is here today for follow up.  She was taken to the [facility] lab on 08/22/2019 by Dr. Trula Slade and underwent: Procedure Performed:             1.  Ultrasound-guided access, right femoral artery             2.  Ultrasound-guided access, left femoral artery             3.  Abdominal aortogram             4.  Bilateral lower extremity runoff             5.  Intravascular lithotripsy, right superficial femoral and popliteal artery             6.  Drug-eluting stent, right superficial femoral-popliteal artery             7.  Conscious sedation, 143 minutes Findings:              Aortogram: No significant infrarenal aortic stenosis.  Bilateral common and external iliac arteries are patent throughout the course however they are heavily calcified.               Right Lower Extremity:  The right common femoral and profundofemoral artery are heavily calcified but patent throughout their course.  The superficial femoral artery is diffusely diseased with multiple high-grade lesions down to the adductor canal where it occludes.  There is reconstitution of the popliteal artery just below the joint space.  There is two-vessel runoff via the peroneal and posterior tibial artery.             Left Lower Extremity: The common femoral and profundofemoral artery are patent without stenosis but heavily calcified.  The superficial femoral artery is occluded with reconstitution of the superficial femoral artery at the adductor canal with diffusely diseased three-vessel runoff and minimal opacification of the digital arteries  She subsequently underwent a left AKA by Dr. Sharol Alexis [name] on 08/25/2019.   The pt returns today for follow up and her daughter is here with her.  She states she is having pain in her left AKA stump.  She resides in a SNF.  She continues to have a wound on the right foot that is followed by Dr.  Sharol Alexis [name].  The pt is on a statin for cholesterol management.    The pt is not on an aspirin.    Other AC:  Plavix/Eliquis The pt is not on meds for hypertension.  The pt does have diabetes. Tobacco hx:  Former-quit 2000   Past Medical History:  Diagnosis Date  . Arthritis   . Cancer [facility]) 2005    left breast  . CHF (congestive heart failure) ([place])   . Chronic back pain   . Chronic kidney disease 03/2014   dialysis t/th/sa  . Coronary artery disease   . Diabetes mellitus    Type 2  . Diabetic nephropathy ([place]) 01-08-13  . Dyslipidemia 01-08-13  . ESRD on hemodialysis ([place])    Tu, Th, Sat  . Headache   . Hyperlipidemia   . Hypertension   . LBBB (left bundle branch block)   . Proteinuria 01-08-13  . Pulmonary hypertension ([place])    PA peak pressure 73 mmHg 08/05/17 echo  . Thyroid disease 01-08-13   Hyper-parathyroidism-secondary  . Wears glasses  Past Surgical History:  Procedure Laterality Date  . A/V FISTULAGRAM N/A 08/25/2017   Procedure: A/V FISTULAGRAM - Left Arm;  Surgeon: Angelia Mould, MD;  Location: [place] [facility];  Service: Cardiovascular;  Laterality: N/A;  . A/V FISTULAGRAM N/A 02/09/2018   Procedure: A/V IDPOEUMPNTI;  Surgeon: Elam Dutch, MD;  Location: [place] [facility] LAB;  Service: Cardiovascular;  Laterality: N/A;  . A/V FISTULAGRAM Left 04/04/2018   Procedure: A/V FISTULAGRAM;  Surgeon: Marty Heck, MD;  Location: [place] [facility];  Service: Cardiovascular;  Laterality: Left;  . A/V FISTULAGRAM Right 07/22/2019   Procedure: A/V FISTULAGRAM - Right Arm;  Surgeon: Waynetta Sandy, MD;  Location: [place] [facility];  Service: Cardiovascular;  Laterality: Right;  . ABDOMINAL AORTAGRAM N/A 08/11/2014   Procedure: ABDOMINAL Maxcine Ham;  Surgeon: Angelia Mould, MD;  Location: [facility] CATH LAB;  Service: Cardiovascular;  Laterality: N/A;  . ABDOMINAL AORTOGRAM W/LOWER EXTREMITY N/A 08/22/2019   Procedure: ABDOMINAL AORTOGRAM W/LOWER  EXTREMITY;  Surgeon: Serafina Mitchell, MD;  Location: [place] [facility] LAB;  Service: Cardiovascular;  Laterality: N/A;  . ABDOMINAL HYSTERECTOMY    . AGILE CAPSULE N/A 09/17/2019   Procedure: AGILE CAPSULE;  Surgeon: Danie Binder, MD;  [facility];  Service: Endoscopy;  Laterality: N/A;  . AGILE CAPSULE N/A 09/18/2019   Procedure: AGILE CAPSULE;  Surgeon: Daneil Dolin, MD;  [facility];  Service: Endoscopy;  Laterality: N/A;  . AMPUTATION Left 08/25/2019   Procedure: LEFT AMPUTATION ABOVE KNEE;  Surgeon: Newt Minion, MD;  Location: [place];  Service: Orthopedics;  Laterality: Left;  . APPENDECTOMY    . Arm surgery     Left arm trauma  . AV FISTULA PLACEMENT Left 08/21/2014   Procedure: LEFT ARM ARTERIOVENOUS (AV) FISTULA CREATION ;  Surgeon: Angelia Mould, MD;  [facility] OR;  Service: Vascular;  Laterality: Left;  . AV FISTULA PLACEMENT Right 06/13/2018   Procedure: insertion of right arm ARTERIOVENOUS (AV) gore-tex GRAFT;  Surgeon: Rosetta Posner, MD;  Location: [place];  Service: Vascular;  Laterality: Right;  . BACK SURGERY    . CATARACT EXTRACTION W/PHACO Left 12/22/2014   Procedure: CATARACT EXTRACTION PHACO AND INTRAOCULAR LENS PLACEMENT (IOC);  Surgeon: Tonny Branch, MD;  [facility]: [facility];  Service: Ophthalmology;  Laterality: Left;  CDE 13.48  . CORONARY ANGIOPLASTY  12/19/2003   inferior wall hypokinesis. ef 50%  . dialysis catheter    . ESOPHAGOGASTRODUODENOSCOPY N/A 09/15/2019   Procedure: ESOPHAGOGASTRODUODENOSCOPY (EGD);  Surgeon: Rogene Houston, MD;  [facility];  Service: Endoscopy;  Laterality: N/A;  . ESOPHAGOGASTRODUODENOSCOPY N/A 09/18/2019   Procedure: ESOPHAGOGASTRODUODENOSCOPY (EGD);  Surgeon: Daneil Dolin, MD;  [facility];  Service: Endoscopy;  Laterality: N/A;  . ESOPHAGOGASTRODUODENOSCOPY (EGD) WITH PROPOFOL N/A 09/20/2019   Procedure: ESOPHAGOGASTRODUODENOSCOPY (EGD) WITH PROPOFOL;  Surgeon: Danie Binder, MD;   [facility];  Service: Endoscopy;  Laterality: N/A;  . GIVENS CAPSULE STUDY N/A 09/20/2019   Procedure: GIVENS CAPSULE STUDY;  Surgeon: Danie Binder, MD;  [facility];  Service: Endoscopy;  Laterality: N/A;  . INSERTION OF DIALYSIS CATHETER Right 06/13/2018   Procedure: INSERTION OF DIALYSIS CATHETER, right internal jugular;  Surgeon: Rosetta Posner, MD;  Location: [place];  Service: Vascular;  Laterality: Right;  . IR FLUORO GUIDE CV LINE RIGHT  04/06/2018  . IR REMOVAL TUN CV CATH W/O FL  04/25/2018  . IR  REMOVAL TUN CV CATH W/O FL  07/20/2018  . IR US GUIDE VASC ACCESS RIGHT  04/06/2018  . LIGATION OF ARTERIOVENOUS  FISTULA Left 03/18/2019   Procedure: LIGATION OF ARTERIOVENOUS  FISTULA  LEFT ARM;  Surgeon: Angelia Mould, MD;  Location: [place];  Service: Vascular;  Laterality: Left;  Marland Kitchen MASTECTOMY     Left  . PERIPHERAL VASCULAR BALLOON ANGIOPLASTY Left 08/25/2017   Procedure: PERIPHERAL VASCULAR BALLOON ANGIOPLASTY;  Surgeon: Angelia Mould, MD;  Location: [place] CV LAB;  Service: Cardiovascular;  Laterality: Left;  arm fistula  . PERIPHERAL VASCULAR BALLOON ANGIOPLASTY Left 02/09/2018   Procedure: PERIPHERAL VASCULAR BALLOON ANGIOPLASTY;  Surgeon: Elam Dutch, MD;  Location: [place] [facility] LAB;  Service: Cardiovascular;  Laterality: Left;  AV Fistula  . PERIPHERAL VASCULAR BALLOON ANGIOPLASTY Left 04/04/2018   Procedure: PERIPHERAL VASCULAR BALLOON ANGIOPLASTY;  Surgeon: Marty Heck, MD;  Location: [place] [facility] LAB;  Service: Cardiovascular;  Laterality: Left;  UPPER ARM FISTULA  . PERIPHERAL VASCULAR CATHETERIZATION N/A 09/16/2015   Procedure: Fistulagram;  Surgeon: Rosetta Posner, MD;  Location: [place] [facility];  Service: Cardiovascular;  Laterality: N/A;  . PERIPHERAL VASCULAR INTERVENTION  08/22/2019   Procedure: PERIPHERAL VASCULAR INTERVENTION;  Surgeon: Serafina Mitchell, MD;  Location: [place] [facility];  Service: Cardiovascular;;   Rt. SFA and Popliteal w/Shockwavw treatment  . POLYPECTOMY  09/20/2019   Procedure: POLYPECTOMY;  Surgeon: Danie Binder, MD;  [facility];  Service: Endoscopy;;  . SPINE SURGERY    . TONSILLECTOMY    . TRANSTHORACIC ECHOCARDIOGRAM  10/01/2010    Left ventricle: The cavity size was mildly dilated. Wall thickness was increased in a pattern of mild LVH. Systolic function was   mildly reduced. The estimated ejection fraction was in the range  of 45% to 50%.   . TUBAL LIGATION      Allergies  Allergen Reactions  . Olmesartan Other (See Comments)    Hyperkalemia     Current Outpatient Medications  Medication Sig Dispense Refill  . acetaminophen (TYLENOL) 325 MG tablet Take 2 tablets (650 mg total) by mouth every 6 (six) hours as needed for mild pain, fever or headache (or Fever >/= 101). 30 tablet 1  . albuterol (VENTOLIN HFA) 108 (90 Base) MCG/ACT inhaler Inhale 1-2 puffs into the lungs every 6 (six) hours as needed for wheezing or shortness of breath.    Marland Kitchen amiodarone (PACERONE) 200 MG tablet Take 1 tablet (200 mg total) by mouth daily. (Patient taking differently: Take 200 mg by mouth daily. (0900)) 90 tablet 3  . apixaban (ELIQUIS) 2.5 MG TABS tablet Take 1 tablet (2.5 mg total) by mouth 2 (two) times daily. (Patient taking differently: Take 2.5 mg by mouth 2 (two) times daily. 0900 & 2100) 60 tablet 0  . atorvastatin (LIPITOR) 80 MG tablet Take 1 tablet (80 mg total) by mouth daily. 90 tablet 3  . calcitRIOL (ROCALTROL) 0.25 MCG capsule Take 0.25 mcg by mouth Every Tuesday,Thursday,and Saturday with dialysis. (1700)    . cefdinir (OMNICEF) 300 MG capsule Take 1 capsule (300 mg total) by mouth every Tuesday, Thursday, and Saturday at 6 PM for 8 days. Please give after hemodialysis only--for left stump infection 4 capsule 0  . clopidogrel (PLAVIX) 75 MG tablet Take 1 tablet (75 mg total) by mouth daily with breakfast.    . diclofenac Sodium (VOLTAREN) 1 % GEL Apply 2 g topically 4  (four) times daily as needed for  pain.    . insulin aspart (NOVOLOG) 100 UNIT/ML injection Substitute to any brand approved.Before each meal 3 times a day, 140-199 - 0 units, 200-250 - 0 units, 251-299 - 2 units,  300-349 - 4 units,  350 or above 6 units. Dispense syringes and needles as needed, Ok to switch to PEN if approved. DX DM2, Code E11.65 10 mL 11  . Insulin Lispro Prot & Lispro (HUMALOG MIX 75/25 KWIKPEN) (75-25) 100 UNIT/ML Kwikpen Inject 8 Units into the skin 2 (two) times daily before a meal. 15 mL 1  . lidocaine-prilocaine (EMLA) cream Apply 1 application topically Every Tuesday,Thursday,and Saturday with dialysis. 1 hour prior to dialysis    . midodrine (PROAMATINE) 10 MG tablet Take 1 tablet (10 mg total) by mouth 3 (three) times daily with meals. 90 tablet 3  . multivitamin (RENA-VIT) TABS tablet Take 1 tablet by mouth at bedtime.  0  . nitroGLYCERIN (NITROSTAT) 0.4 MG SL tablet Place 0.4 mg under the tongue every 5 (five) minutes x 3 doses as needed for chest pain.     Marland Kitchen ondansetron (ZOFRAN-ODT) 4 MG disintegrating tablet Take 4 mg by mouth every 6 (six) hours as needed for nausea or vomiting.     Marland Kitchen oxyCODONE (OXY IR/ROXICODONE) 5 MG immediate release tablet Take 1 tablet (5 mg total) by mouth every 6 (six) hours as needed for moderate pain or severe pain (pain score 4-6). 12 tablet 0  . pantoprazole (PROTONIX) 40 MG tablet Take 1 tablet (40 mg total) by mouth daily.    . polyethylene glycol (MIRALAX / GLYCOLAX) packet Take 17 g by mouth daily as needed for mild constipation.     . senna-docusate (SENOKOT-S) 8.6-50 MG tablet Take 2 tablets by mouth at bedtime. 60 tablet 1  . sevelamer carbonate (RENVELA) 800 MG tablet Take 1,600 mg by mouth 3 (three) times daily with meals.     Current Facility-Administered Medications  Medication Dose Route Frequency Provider Last Rate Last Admin  . lidocaine (PF) (XYLOCAINE) 1 % injection 0.3 mL  0.3 mL Other Once Magnus Sinning, MD         Family History  Problem Relation Age of Onset  . Breast cancer Mother        Died age 21  . Cancer Mother 34       Breast  . Heart disease Mother   . Hyperlipidemia Mother   . Hypertension Mother   . Heart attack Mother     Social History   Socioeconomic History  . Marital status: Legally Separated    Spouse name: Not on file  . Number of children: 3  . Years of education: Not on file  . Highest education level: Not on file  Occupational History  . Occupation: Retired  Tobacco Use  . Smoking status: Former Smoker    Packs/day: 1.00    Years: 50.00    Pack years: 50.00    Types: Cigarettes    Quit date: 05/16/1998    Years since quitting: 21.3  . Smokeless tobacco: Never Used  Substance and Sexual Activity  . Alcohol use: No    Alcohol/week: 0.0 standard drinks  . Drug use: No  . Sexual activity: Yes    Birth control/protection: Post-menopausal  Other Topics Concern  . Not on file  Social History Narrative   Lives at [place] alone (most of the time).  Three grandchildren and a one year old great grandchild   Social Determinants of Radio broadcast assistant  Strain:   . Difficulty of Paying Living Expenses:   Food Insecurity:   . Worried About Charity fundraiser in the Last Year:   . Arboriculturist in the Last Year:   Transportation Needs:   . Film/video editor (Medical):   Marland Kitchen Lack of Transportation (Non-Medical):   Physical Activity:   . Days of Exercise per Week:   . Minutes of Exercise per Session:   Stress:   . Feeling of Stress :   Social Connections:   . Frequency of Communication with Friends and Family:   . Frequency of Social Gatherings with Friends and Family:   . Attends Religious Services:   . Active Member of Clubs or Organizations:   . Attends Archivist Meetings:   Marland Kitchen Marital Status:   Intimate Partner Violence:   . Fear of Current or Ex-Partner:   . Emotionally Abused:   Marland Kitchen Physically Abused:   . Sexually Abused:       REVIEW OF SYSTEMS:   [X]  denotes positive finding, [ ]  denotes negative finding Cardiac  Comments:  afib x       Vascular    Pain in calf, thigh, or hip brought on by ambulation:    Pain in left stump x   Blood clot in your veins:    Leg swelling:         Pulmonary    Oxygen at home:    Productive cough:     Wheezing:         Neurologic    Hx CVA        Gastrointestinal    Blood in stool:     Vomited blood:         Genitourinary    Burning when urinating:     Blood in urine:        Psychiatric    Anxiety x       Hematologic    Bleeding problems:        Skin    Rashes or ulcers:        Constitutional    Fever or chills:      PHYSICAL EXAMINATION:  Today's Vitals   09/30/19 1106  BP: (!) 135/57  Pulse: 70  Resp: 16  Temp: (!) 97.3 F (36.3 C)  TempSrc: Temporal  SpO2: 96%  Weight: 214 lb (97.1 kg)  Height: 5\' 6"  (1.676 m)  PainSc: 7    Body mass index is 34.54 kg/m.   General:  WDWN in NAD; vital signs documented above Gait: Not observed HENT: WNL, normocephalic Pulmonary: normal non-labored breathing , without Rales, rhonchi,  wheezing Cardiac: regular HR, without  Murmurs; without carotid bruits Skin: without rashes Vascular Exam/Pulses: Brisk doppler signals right peroneal, AT and PT.   Extremities:      Musculoskeletal: no muscle wasting or atrophy  Neurologic: A&O X 3;  No focal weakness or paresthesias are detected Psychiatric:  The pt has Normal affect.   Non-Invasive Vascular Imaging:   ABI's/TBI's on 09/30/2019: Right:  [place] - Great toe pressure: 0.35 Left:  AKA   Arterial duplex on 09/30/2019: +-----------+--------+-----+--------+----------+----------------+  RIGHT   PSV cm/sRatioStenosisWaveform Comments      +-----------+--------+-----+--------+----------+----------------+  CFA Distal 107                          +-----------+--------+-----+--------+----------+----------------+  DFA    50                         +-----------+--------+-----+--------+----------+----------------+  POP Prox  48                         +-----------+--------+-----+--------+----------+----------------+  POP Distal 46                         +-----------+--------+-----+--------+----------+----------------+  ATA Prox                  know occl vessel  +-----------+--------+-----+--------+----------+----------------+  ATA Distal 28                         +-----------+--------+-----+--------+----------+----------------+  PTA Prox  65          monophasic          +-----------+--------+-----+--------+----------+----------------+  PTA Distal 64          monophasic          +-----------+--------+-----+--------+----------+----------------+  PERO Distal68          biphasic           +-----------+--------+-----+--------+----------+----------------+     Right Stent(s):  +---------------+---++--------++  Prox to Stent 107biphasic  +---------------+---++--------++  Proximal Stent 66 biphasic  +---------------+---++--------++  Mid Stent   43 biphasic  +---------------+---++--------++  Distal Stent  38 biphasic  +---------------+---++--------++  Distal to Stent46 biphasic  +---------------+---++--------++   Summary:  Right: Right lower extremity stent is open and patent without significant  stenosis noted.   Previous ABI's/TBI's on 08/21/2019: Right:  1.0 (falsely elevated) Left:  0.48   ASSESSMENT/PLAN:: 74 y.o. Alexis [name] here for follow up for Intravascular lithotripsy, right superficial femoral and popliteal artery and Drug-eluting stent, right superficial femoral-popliteal  artery on 08/22/2019   -pt doing well with +doppler signals right AT/PT/peroneal and RLE stent is patent without significant stenosis.  I did discuss the results and velocities with Dr. Trula Slade and will have pt return in 3 months for repeat studies.   -her wound on the lateral aspect of her foot is clean and is being followed by Dr. Sharol Alexis [name] who she sees on Wednesday per daughter.     Leontine Locket, [facility] [phone]  Clinic MD:   Trula Slade

## 2019-10-01 ENCOUNTER — Other Ambulatory Visit: Payer: Self-pay | Admitting: *Deleted

## 2019-10-01 ENCOUNTER — Inpatient Hospital Stay: Payer: Medicare Other | Admitting: Family

## 2019-10-01 DIAGNOSIS — I739 Peripheral vascular disease, unspecified: Secondary | ICD-10-CM

## 2019-10-01 DIAGNOSIS — C50912 Malignant neoplasm of unspecified site of left female breast: Secondary | ICD-10-CM | POA: Diagnosis not present

## 2019-10-01 DIAGNOSIS — L299 Pruritus, unspecified: Secondary | ICD-10-CM | POA: Diagnosis not present

## 2019-10-01 DIAGNOSIS — T8744 Infection of amputation stump, left lower extremity: Secondary | ICD-10-CM | POA: Diagnosis not present

## 2019-10-01 DIAGNOSIS — I4891 Unspecified atrial fibrillation: Secondary | ICD-10-CM | POA: Diagnosis not present

## 2019-10-01 DIAGNOSIS — N2581 Secondary hyperparathyroidism of renal origin: Secondary | ICD-10-CM | POA: Diagnosis not present

## 2019-10-01 DIAGNOSIS — N186 End stage renal disease: Secondary | ICD-10-CM | POA: Diagnosis not present

## 2019-10-01 DIAGNOSIS — J449 Chronic obstructive pulmonary disease, unspecified: Secondary | ICD-10-CM | POA: Diagnosis not present

## 2019-10-01 DIAGNOSIS — Z992 Dependence on renal dialysis: Secondary | ICD-10-CM | POA: Diagnosis not present

## 2019-10-02 ENCOUNTER — Other Ambulatory Visit: Payer: Self-pay

## 2019-10-02 ENCOUNTER — Ambulatory Visit (INDEPENDENT_AMBULATORY_CARE_PROVIDER_SITE_OTHER): Payer: Medicare Other | Admitting: Family

## 2019-10-02 ENCOUNTER — Encounter: Payer: Self-pay | Admitting: Family

## 2019-10-02 VITALS — Ht 66.0 in | Wt 214.0 lb

## 2019-10-02 DIAGNOSIS — Z89612 Acquired absence of left leg above knee: Secondary | ICD-10-CM

## 2019-10-02 DIAGNOSIS — S78112A Complete traumatic amputation at level between left hip and knee, initial encounter: Secondary | ICD-10-CM

## 2019-10-02 MED ORDER — SILVER SULFADIAZINE 1 % EX CREA
1.0000 | TOPICAL_CREAM | Freq: Every day | CUTANEOUS | 0 refills | Status: AC
Start: 2019-10-02 — End: ?

## 2019-10-02 MED ORDER — DOXYCYCLINE HYCLATE 100 MG PO TABS: 100.0000 mg | ORAL_TABLET | Freq: Two times a day (BID) | ORAL | 0 refills | Status: DC

## 2019-10-02 NOTE — Progress Notes (Signed)
Post-Op Visit Note   Patient: Alexis [name]           Date of Birth: Jun 15, 1945           MRN: [id number] Visit Date: 10/02/2019 PCP: System, Provider Not In  Chief Complaint:  Chief Complaint  Patient presents with  . Left Leg - Routine Post Op    08/25/19 left AKA    HPI:  HPI The patient is a 74 year old woman who presents today in follow-up.  She is status post left above-knee amputation on April 11.  Unfortunately she has been hospitalized since last visit.  From April 29 through May 2 for GI bleed.  Ortho Exam The left above-knee amputation healing well sutures and staples remain in place.  There are 2 areas of the center of her incision at suture portals where she does have some ulceration.  These are completely filled in with fibrinous tissue.  She has been having some clear brown drainage.  There is not a significant odor there is no surrounding erythema mild tenderness to areas of ulceration  Visit Diagnoses: No diagnosis found.  Plan: Staples harvested today without incident.  Will apply Silvadene dressings.  Have placed her on doxycycline.  She will follow-up in the office in 2 weeks.  They will cleanse this daily.  Follow-Up Instructions: No follow-ups on file.   Imaging: No results found.  Orders:  No orders of the defined types were placed in this encounter.  No orders of the defined types were placed in this encounter.    PMFS History: Patient Active Problem List   Diagnosis Date Noted  . Gastric polyp   . Hypoglycemia   . Anxiety state   . Palliative care by specialist   . Goals of care, counseling/discussion   . Pressure injury of skin 09/14/2019  . Hypotension 09/14/2019  . UGI bleed 09/13/2019  . Melena   . Hx of AKA (above knee amputation), left ([place])   . Gangrene of left foot ([place])   . 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 PVD (peripheral vascular disease) ([place])   . Osteomyelitis ([place]) 08/18/2019  . Diabetic foot  ulcers ([place]) 08/18/2019  . Emesis 08/18/2019  . Altered mental status 06/11/2019  . Generalized weakness 06/11/2019  . Acute delirium 06/11/2019  . Hallucinations 06/11/2019  . Acute CVA (cerebrovascular accident) ([place]) 06/11/2019  . Hypokalemia 05/12/2019  . Hyperlipidemia   . Pulmonary hypertension ([place])   . GERD (gastroesophageal reflux disease)   . Stable angina (H[facility])   . Atrial fibrillation with rapid ventricular response ([place]) 05/09/2019  . Elevated troponin   . ESRD on dialysis ([place])   . Dyspnea 08/13/2017  . Chest pain 08/04/2017  . 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 Essential hypertension 08/15/2012  . CAD (coronary artery disease) 03/25/2011  . Type II diabetes mellitus with manifestations ([place]) 03/25/2011  . Chest pain 03/25/2011  . CHF (congestive heart failure) ([place]) 10/14/2010  . Anemia in chronic kidney disease (CKD) 10/14/2010  . Obesity 10/14/2010   Past Medical History:  Diagnosis Date  . Arthritis   . Cancer [facility]) 2005    left breast  . CHF (congestive heart failure) ([place])   . Chronic back pain   . Chronic kidney disease 03/2014   dialysis t/th/sa  . Coronary artery disease   . Diabetes mellitus    Type 2  . Diabetic nephropathy ([place]) 01-08-13  . Dyslipidemia 01-08-13  .  ESRD on hemodialysis ([place])    Tu, Th, Sat  . Headache   . Hyperlipidemia   . Hypertension   . LBBB (left bundle branch block)   . Proteinuria 01-08-13  . Pulmonary hypertension ([place])    PA peak pressure 73 mmHg 08/05/17 echo  . Thyroid disease 01-08-13   Hyper-parathyroidism-secondary  . Wears glasses     Family History  Problem Relation Age of Onset  . Breast cancer Mother        Died age 28  . Cancer Mother 53       Breast  . Heart disease Mother   . Hyperlipidemia Mother   . Hypertension Mother   . Heart attack Mother     Past Surgical History:  Procedure Laterality Date  . A/V FISTULAGRAM N/A 08/25/2017   Procedure: A/V  FISTULAGRAM - Left Arm;  Surgeon: Angelia Mould, MD;  Location: [place] [facility];  Service: Cardiovascular;  Laterality: N/A;  . A/V FISTULAGRAM N/A 02/09/2018   Procedure: A/V [id number];  Surgeon: Elam Dutch, MD;  Location: [place] [facility];  Service: Cardiovascular;  Laterality: N/A;  . A/V FISTULAGRAM Left 04/04/2018   Procedure: A/V FISTULAGRAM;  Surgeon: Marty Heck, MD;  Location: [place] [facility];  Service: Cardiovascular;  Laterality: Left;  . A/V FISTULAGRAM Right 07/22/2019   Procedure: A/V FISTULAGRAM - Right Arm;  Surgeon: Waynetta Sandy, MD;  Location: [place] [facility];  Service: Cardiovascular;  Laterality: Right;  . ABDOMINAL AORTAGRAM N/A 08/11/2014   Procedure: ABDOMINAL Maxcine Ham;  Surgeon: Angelia Mould, MD;  Location: [facility] CATH LAB;  Service: Cardiovascular;  Laterality: N/A;  . ABDOMINAL AORTOGRAM W/LOWER EXTREMITY N/A 08/22/2019   Procedure: ABDOMINAL AORTOGRAM W/LOWER EXTREMITY;  Surgeon: Serafina Mitchell, MD;  Location: [place] [facility];  Service: Cardiovascular;  Laterality: N/A;  . ABDOMINAL HYSTERECTOMY    . AGILE CAPSULE N/A 09/17/2019   Procedure: AGILE CAPSULE;  Surgeon: Danie Binder, MD;  [facility];  Service: Endoscopy;  Laterality: N/A;  . AGILE CAPSULE N/A 09/18/2019   Procedure: AGILE CAPSULE;  Surgeon: Daneil Dolin, MD;  [facility];  Service: Endoscopy;  Laterality: N/A;  . AMPUTATION Left 08/25/2019   Procedure: LEFT AMPUTATION ABOVE KNEE;  Surgeon: Newt Minion, MD;  Location: [place];  Service: Orthopedics;  Laterality: Left;  . APPENDECTOMY    . Arm surgery     Left arm trauma  . AV FISTULA PLACEMENT Left 08/21/2014   Procedure: LEFT ARM ARTERIOVENOUS (AV) FISTULA CREATION ;  Surgeon: Angelia Mould, MD;  Location: [facility] OR;  Service: Vascular;  Laterality: Left;  . AV FISTULA PLACEMENT Right 06/13/2018   Procedure: insertion of right arm ARTERIOVENOUS (AV) gore-tex GRAFT;   Surgeon: Rosetta Posner, MD;  Location: [place];  Service: Vascular;  Laterality: Right;  . BACK SURGERY    . CATARACT EXTRACTION W/PHACO Left 12/22/2014   Procedure: CATARACT EXTRACTION PHACO AND INTRAOCULAR LENS PLACEMENT (IOC);  Surgeon: Tonny Branch, MD;  [facility];  Service: Ophthalmology;  Laterality: Left;  CDE 13.48  . CORONARY ANGIOPLASTY  12/19/2003   inferior wall hypokinesis. ef 50%  . dialysis catheter    . ESOPHAGOGASTRODUODENOSCOPY N/A 09/15/2019   Procedure: ESOPHAGOGASTRODUODENOSCOPY (EGD);  Surgeon: Rogene Houston, MD;  [facility];  Service: Endoscopy;  Laterality: N/A;  . ESOPHAGOGASTRODUODENOSCOPY N/A 09/18/2019   Procedure: ESOPHAGOGASTRODUODENOSCOPY (EGD);  Surgeon: Daneil Dolin, MD;  [facility];  Service: Endoscopy;  Laterality: N/A;  . ESOPHAGOGASTRODUODENOSCOPY (EGD) WITH PROPOFOL N/A 09/20/2019   Procedure: ESOPHAGOGASTRODUODENOSCOPY (EGD) WITH PROPOFOL;  Surgeon: Danie Binder, MD;  [facility];  Service: Endoscopy;  Laterality: N/A;  . GIVENS CAPSULE STUDY N/A 09/20/2019   Procedure: GIVENS CAPSULE STUDY;  Surgeon: Danie Binder, MD;  [facility];  Service: Endoscopy;  Laterality: N/A;  . INSERTION OF DIALYSIS CATHETER Right 06/13/2018   Procedure: INSERTION OF DIALYSIS CATHETER, right internal jugular;  Surgeon: Rosetta Posner, MD;  Location: [place];  Service: Vascular;  Laterality: Right;  . IR FLUORO GUIDE CV LINE RIGHT  04/06/2018  . IR REMOVAL TUN CV CATH W/O FL  04/25/2018  . IR REMOVAL TUN CV CATH W/O FL  07/20/2018  . IR US GUIDE VASC ACCESS RIGHT  04/06/2018  . LIGATION OF ARTERIOVENOUS  FISTULA Left 03/18/2019   Procedure: LIGATION OF ARTERIOVENOUS  FISTULA  LEFT ARM;  Surgeon: Angelia Mould, MD;  Location: [place];  Service: Vascular;  Laterality: Left;  Marland Kitchen MASTECTOMY     Left  . PERIPHERAL VASCULAR BALLOON ANGIOPLASTY Left 08/25/2017   Procedure: PERIPHERAL VASCULAR BALLOON ANGIOPLASTY;  Surgeon:  Angelia Mould, MD;  Location: [place] [facility];  Service: Cardiovascular;  Laterality: Left;  arm fistula  . PERIPHERAL VASCULAR BALLOON ANGIOPLASTY Left 02/09/2018   Procedure: PERIPHERAL VASCULAR BALLOON ANGIOPLASTY;  Surgeon: Elam Dutch, MD;  Location: [place] [facility];  Service: Cardiovascular;  Laterality: Left;  AV Fistula  . PERIPHERAL VASCULAR BALLOON ANGIOPLASTY Left 04/04/2018   Procedure: PERIPHERAL VASCULAR BALLOON ANGIOPLASTY;  Surgeon: Marty Heck, MD;  Location: [place] [facility];  Service: Cardiovascular;  Laterality: Left;  UPPER ARM FISTULA  . PERIPHERAL VASCULAR CATHETERIZATION N/A 09/16/2015   Procedure: Fistulagram;  Surgeon: Rosetta Posner, MD;  Location: [place] [facility];  Service: Cardiovascular;  Laterality: N/A;  . PERIPHERAL VASCULAR INTERVENTION  08/22/2019   Procedure: PERIPHERAL VASCULAR INTERVENTION;  Surgeon: Serafina Mitchell, MD;  Location: [place] [facility];  Service: Cardiovascular;;  Rt. SFA and Popliteal w/Shockwavw treatment  . POLYPECTOMY  09/20/2019   Procedure: POLYPECTOMY;  Surgeon: Danie Binder, MD;  [facility];  Service: Endoscopy;;  . SPINE SURGERY    . TONSILLECTOMY    . TRANSTHORACIC ECHOCARDIOGRAM  10/01/2010    Left ventricle: The cavity size was mildly dilated. Wall thickness was increased in a pattern of mild LVH. Systolic function was   mildly reduced. The estimated ejection fraction was in the range  of 45% to 50%.   . TUBAL LIGATION     Social History   Occupational History  . Occupation: Retired  Tobacco Use  . Smoking status: Former Smoker    Packs/day: 1.00    Years: 50.00    Pack years: 50.00    Types: Cigarettes    Quit date: 05/16/1998    Years since quitting: 21.3  . Smokeless tobacco: Never Used  Substance and Sexual Activity  . Alcohol use: No    Alcohol/week: 0.0 standard drinks  . Drug use: No  . Sexual activity: Yes    Birth control/protection: Post-menopausal

## 2019-10-03 DIAGNOSIS — Z992 Dependence on renal dialysis: Secondary | ICD-10-CM | POA: Diagnosis not present

## 2019-10-03 DIAGNOSIS — N186 End stage renal disease: Secondary | ICD-10-CM | POA: Diagnosis not present

## 2019-10-03 DIAGNOSIS — T8744 Infection of amputation stump, left lower extremity: Secondary | ICD-10-CM | POA: Diagnosis not present

## 2019-10-03 DIAGNOSIS — N2581 Secondary hyperparathyroidism of renal origin: Secondary | ICD-10-CM | POA: Diagnosis not present

## 2019-10-05 DIAGNOSIS — N2581 Secondary hyperparathyroidism of renal origin: Secondary | ICD-10-CM | POA: Diagnosis not present

## 2019-10-05 DIAGNOSIS — Z992 Dependence on renal dialysis: Secondary | ICD-10-CM | POA: Diagnosis not present

## 2019-10-05 DIAGNOSIS — N186 End stage renal disease: Secondary | ICD-10-CM | POA: Diagnosis not present

## 2019-10-05 DIAGNOSIS — T8744 Infection of amputation stump, left lower extremity: Secondary | ICD-10-CM | POA: Diagnosis not present

## 2019-10-08 DIAGNOSIS — N2581 Secondary hyperparathyroidism of renal origin: Secondary | ICD-10-CM | POA: Diagnosis not present

## 2019-10-08 DIAGNOSIS — T8744 Infection of amputation stump, left lower extremity: Secondary | ICD-10-CM | POA: Diagnosis not present

## 2019-10-08 DIAGNOSIS — N186 End stage renal disease: Secondary | ICD-10-CM | POA: Diagnosis not present

## 2019-10-08 DIAGNOSIS — Z992 Dependence on renal dialysis: Secondary | ICD-10-CM | POA: Diagnosis not present

## 2019-10-10 ENCOUNTER — Telehealth: Payer: Self-pay | Admitting: Orthopedic Surgery

## 2019-10-10 DIAGNOSIS — N2581 Secondary hyperparathyroidism of renal origin: Secondary | ICD-10-CM | POA: Diagnosis not present

## 2019-10-10 DIAGNOSIS — Z992 Dependence on renal dialysis: Secondary | ICD-10-CM | POA: Diagnosis not present

## 2019-10-10 DIAGNOSIS — N186 End stage renal disease: Secondary | ICD-10-CM | POA: Diagnosis not present

## 2019-10-10 DIAGNOSIS — T8744 Infection of amputation stump, left lower extremity: Secondary | ICD-10-CM | POA: Diagnosis not present

## 2019-10-10 NOTE — Telephone Encounter (Signed)
Ms. Bayles presented to dialysis today at [facility] and they noticed that she had some blood and discharge on her pants. She is s/p above knee amputation and has been seeing nurse practitioner Dondra Prader for follow up in our office.  Erin prescribed doxycycline, but it is not certain if this was filled for the patient or not.  Because of this, she will receive IV antibiotics at her dialysis treatments until she returns to see [place] next week. I advised Dr. Sharol Given of their plan and he is in agreement.

## 2019-10-12 DIAGNOSIS — T8744 Infection of amputation stump, left lower extremity: Secondary | ICD-10-CM | POA: Diagnosis not present

## 2019-10-12 DIAGNOSIS — N186 End stage renal disease: Secondary | ICD-10-CM | POA: Diagnosis not present

## 2019-10-12 DIAGNOSIS — Z992 Dependence on renal dialysis: Secondary | ICD-10-CM | POA: Diagnosis not present

## 2019-10-12 DIAGNOSIS — N2581 Secondary hyperparathyroidism of renal origin: Secondary | ICD-10-CM | POA: Diagnosis not present

## 2019-10-14 DIAGNOSIS — N186 End stage renal disease: Secondary | ICD-10-CM | POA: Diagnosis not present

## 2019-10-14 DIAGNOSIS — Z992 Dependence on renal dialysis: Secondary | ICD-10-CM | POA: Diagnosis not present

## 2019-10-15 DIAGNOSIS — Z992 Dependence on renal dialysis: Secondary | ICD-10-CM | POA: Diagnosis not present

## 2019-10-15 DIAGNOSIS — N185 Chronic kidney disease, stage 5: Secondary | ICD-10-CM | POA: Diagnosis not present

## 2019-10-15 DIAGNOSIS — N2581 Secondary hyperparathyroidism of renal origin: Secondary | ICD-10-CM | POA: Diagnosis not present

## 2019-10-15 DIAGNOSIS — Z299 Encounter for prophylactic measures, unspecified: Secondary | ICD-10-CM | POA: Diagnosis not present

## 2019-10-15 DIAGNOSIS — I10 Essential (primary) hypertension: Secondary | ICD-10-CM | POA: Diagnosis not present

## 2019-10-15 DIAGNOSIS — N186 End stage renal disease: Secondary | ICD-10-CM | POA: Diagnosis not present

## 2019-10-15 DIAGNOSIS — D631 Anemia in chronic kidney disease: Secondary | ICD-10-CM | POA: Diagnosis not present

## 2019-10-16 ENCOUNTER — Other Ambulatory Visit: Payer: Self-pay

## 2019-10-16 ENCOUNTER — Encounter: Payer: Self-pay | Admitting: Family

## 2019-10-16 ENCOUNTER — Ambulatory Visit (INDEPENDENT_AMBULATORY_CARE_PROVIDER_SITE_OTHER): Payer: Medicare Other | Admitting: Family

## 2019-10-16 VITALS — Ht 66.0 in | Wt 214.0 lb

## 2019-10-16 DIAGNOSIS — Z89612 Acquired absence of left leg above knee: Secondary | ICD-10-CM

## 2019-10-16 DIAGNOSIS — S78112A Complete traumatic amputation at level between left hip and knee, initial encounter: Secondary | ICD-10-CM

## 2019-10-16 NOTE — Progress Notes (Signed)
Post-Op Visit Note   Patient: Alexis [name]           Date of Birth: 1945-09-09           MRN: [id number] Visit Date: 10/16/2019 PCP: System, Provider Not In  Chief Complaint:  Chief Complaint  Patient presents with  . Left Leg - Routine Post Op    08/25/19 left AKA     HPI:  HPI Patient is a 74 year old woman who presents today status post left above-knee amputation on April 11 of this year.  She is residing at skilled nursing she has been having Allevyn foam dressings applied daily.  There are 2 remaining open areas.  The patient does not voice any concerns.  Ortho Exam On examination of the left above-knee amputation this is consolidating well.  There are 2 remaining open areas there is one retained suture.  The suture was harvested today there is full granulation in this ulcerative area.  Just medially there is another suture portal that has not yet healed this measures about 2 cm x 5 mm and is fully filled in with fibrinous tissue.  It is tender.  There is no purulence no surrounding erythema no sign of infection  Visit Diagnoses: No diagnosis found.  Plan: Continue daily Dial soap cleansing.  Dry dressing changes.  She will follow-up in the office in 3 weeks.  Follow-Up Instructions: No follow-ups on file.   Imaging: No results found.  Orders:  No orders of the defined types were placed in this encounter.  No orders of the defined types were placed in this encounter.    PMFS History: Patient Active Problem List   Diagnosis Date Noted  . Gastric polyp   . Hypoglycemia   . Anxiety state   . Palliative care by specialist   . Goals of care, counseling/discussion   . Pressure injury of skin 09/14/2019  . Hypotension 09/14/2019  . UGI bleed 09/13/2019  . Melena   . Hx of AKA (above knee amputation), left ([place])   . Gangrene of left foot ([place])   . 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 PVD (peripheral vascular disease) ([place])   .  Osteomyelitis ([place]) 08/18/2019  . Diabetic foot ulcers ([place]) 08/18/2019  . Emesis 08/18/2019  . Altered mental status 06/11/2019  . Generalized weakness 06/11/2019  . Acute delirium 06/11/2019  . Hallucinations 06/11/2019  . Acute CVA (cerebrovascular accident) ([place]) 06/11/2019  . Hypokalemia 05/12/2019  . Hyperlipidemia   . Pulmonary hypertension ([place])   . GERD (gastroesophageal reflux disease)   . Stable angina (HCC)   . Atrial fibrillation with rapid ventricular response ([place]) 05/09/2019  . Elevated troponin   . ESRD on dialysis ([place])   . Dyspnea 08/13/2017  . Chest pain 08/04/2017  . 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 Essential hypertension 08/15/2012  . CAD (coronary artery disease) 03/25/2011  . Type II diabetes mellitus with manifestations ([place]) 03/25/2011  . Chest pain 03/25/2011  . CHF (congestive heart failure) ([place]) 10/14/2010  . Anemia in chronic kidney disease (CKD) 10/14/2010  . Obesity 10/14/2010   Past Medical History:  Diagnosis Date  . Arthritis   . Cancer [facility]) 2005    left breast  . CHF (congestive heart failure) ([place])   . Chronic back pain   . Chronic kidney disease 03/2014   dialysis t/th/sa  . Coronary artery disease   . Diabetes mellitus    Type  2  . Diabetic nephropathy ([place]) 01-08-13  . Dyslipidemia 01-08-13  . ESRD on hemodialysis ([place])    Tu, Th, Sat  . Headache   . Hyperlipidemia   . Hypertension   . LBBB (left bundle branch block)   . Proteinuria 01-08-13  . Pulmonary hypertension ([place])    PA peak pressure 73 mmHg 08/05/17 echo  . Thyroid disease 01-08-13   Hyper-parathyroidism-secondary  . Wears glasses     Family History  Problem Relation Age of Onset  . Breast cancer Mother        Died age 35  . Cancer Mother 11       Breast  . Heart disease Mother   . Hyperlipidemia Mother   . Hypertension Mother   . Heart attack Mother     Past Surgical History:  Procedure Laterality Date  . A/V  FISTULAGRAM N/A 08/25/2017   Procedure: A/V FISTULAGRAM - Left Arm;  Surgeon: Angelia Mould, MD;  Location: [place] [facility];  Service: Cardiovascular;  Laterality: N/A;  . A/V FISTULAGRAM N/A 02/09/2018   Procedure: A/V [id number];  Surgeon: Elam Dutch, MD;  Location: [place] [facility];  Service: Cardiovascular;  Laterality: N/A;  . A/V FISTULAGRAM Left 04/04/2018   Procedure: A/V FISTULAGRAM;  Surgeon: Marty Heck, MD;  Location: [place] [facility];  Service: Cardiovascular;  Laterality: Left;  . A/V FISTULAGRAM Right 07/22/2019   Procedure: A/V FISTULAGRAM - Right Arm;  Surgeon: Waynetta Sandy, MD;  Location: [place] [facility];  Service: Cardiovascular;  Laterality: Right;  . ABDOMINAL AORTAGRAM N/A 08/11/2014   Procedure: ABDOMINAL Maxcine Ham;  Surgeon: Angelia Mould, MD;  Location: [facility] CATH LAB;  Service: Cardiovascular;  Laterality: N/A;  . ABDOMINAL AORTOGRAM W/LOWER EXTREMITY N/A 08/22/2019   Procedure: ABDOMINAL AORTOGRAM W/LOWER EXTREMITY;  Surgeon: Serafina Mitchell, MD;  Location: [place] [facility];  Service: Cardiovascular;  Laterality: N/A;  . ABDOMINAL HYSTERECTOMY    . AGILE CAPSULE N/A 09/17/2019   Procedure: AGILE CAPSULE;  Surgeon: Danie Binder, MD;  [facility];  Service: Endoscopy;  Laterality: N/A;  . AGILE CAPSULE N/A 09/18/2019   Procedure: AGILE CAPSULE;  Surgeon: Daneil Dolin, MD;  [facility];  Service: Endoscopy;  Laterality: N/A;  . AMPUTATION Left 08/25/2019   Procedure: LEFT AMPUTATION ABOVE KNEE;  Surgeon: Newt Minion, MD;  Location: [place];  Service: Orthopedics;  Laterality: Left;  . APPENDECTOMY    . Arm surgery     Left arm trauma  . AV FISTULA PLACEMENT Left 08/21/2014   Procedure: LEFT ARM ARTERIOVENOUS (AV) FISTULA CREATION ;  Surgeon: Angelia Mould, MD;  Location: [facility] OR;  Service: Vascular;  Laterality: Left;  . AV FISTULA PLACEMENT Right 06/13/2018   Procedure: insertion of right  arm ARTERIOVENOUS (AV) gore-tex GRAFT;  Surgeon: Rosetta Posner, MD;  Location: [place];  Service: Vascular;  Laterality: Right;  . BACK SURGERY    . CATARACT EXTRACTION W/PHACO Left 12/22/2014   Procedure: CATARACT EXTRACTION PHACO AND INTRAOCULAR LENS PLACEMENT (IOC);  Surgeon: Tonny Branch, MD;  [facility];  Service: Ophthalmology;  Laterality: Left;  CDE 13.48  . CORONARY ANGIOPLASTY  12/19/2003   inferior wall hypokinesis. ef 50%  . dialysis catheter    . ESOPHAGOGASTRODUODENOSCOPY N/A 09/15/2019   Procedure: ESOPHAGOGASTRODUODENOSCOPY (EGD);  Surgeon: Rogene Houston, MD;  [facility];  Service: Endoscopy;  Laterality: N/A;  . ESOPHAGOGASTRODUODENOSCOPY N/A 09/18/2019   Procedure: ESOPHAGOGASTRODUODENOSCOPY (  EGD);  Surgeon: Daneil Dolin, MD;  [facility];  Service: Endoscopy;  Laterality: N/A;  . ESOPHAGOGASTRODUODENOSCOPY (EGD) WITH PROPOFOL N/A 09/20/2019   Procedure: ESOPHAGOGASTRODUODENOSCOPY (EGD) WITH PROPOFOL;  Surgeon: Danie Binder, MD;  [facility];  Service: Endoscopy;  Laterality: N/A;  . GIVENS CAPSULE STUDY N/A 09/20/2019   Procedure: GIVENS CAPSULE STUDY;  Surgeon: Danie Binder, MD;  [facility];  Service: Endoscopy;  Laterality: N/A;  . INSERTION OF DIALYSIS CATHETER Right 06/13/2018   Procedure: INSERTION OF DIALYSIS CATHETER, right internal jugular;  Surgeon: Rosetta Posner, MD;  Location: [place];  Service: Vascular;  Laterality: Right;  . IR FLUORO GUIDE CV LINE RIGHT  04/06/2018  . IR REMOVAL TUN CV CATH W/O FL  04/25/2018  . IR REMOVAL TUN CV CATH W/O FL  07/20/2018  . IR US GUIDE VASC ACCESS RIGHT  04/06/2018  . LIGATION OF ARTERIOVENOUS  FISTULA Left 03/18/2019   Procedure: LIGATION OF ARTERIOVENOUS  FISTULA  LEFT ARM;  Surgeon: Angelia Mould, MD;  Location: [place];  Service: Vascular;  Laterality: Left;  Marland Kitchen MASTECTOMY     Left  . PERIPHERAL VASCULAR BALLOON ANGIOPLASTY Left 08/25/2017   Procedure: PERIPHERAL  VASCULAR BALLOON ANGIOPLASTY;  Surgeon: Angelia Mould, MD;  Location: [place] [facility] LAB;  Service: Cardiovascular;  Laterality: Left;  arm fistula  . PERIPHERAL VASCULAR BALLOON ANGIOPLASTY Left 02/09/2018   Procedure: PERIPHERAL VASCULAR BALLOON ANGIOPLASTY;  Surgeon: Elam Dutch, MD;  Location: [place] [facility];  Service: Cardiovascular;  Laterality: Left;  AV Fistula  . PERIPHERAL VASCULAR BALLOON ANGIOPLASTY Left 04/04/2018   Procedure: PERIPHERAL VASCULAR BALLOON ANGIOPLASTY;  Surgeon: Marty Heck, MD;  Location: [place] [facility];  Service: Cardiovascular;  Laterality: Left;  UPPER ARM FISTULA  . PERIPHERAL VASCULAR CATHETERIZATION N/A 09/16/2015   Procedure: Fistulagram;  Surgeon: Rosetta Posner, MD;  Location: [place] [facility];  Service: Cardiovascular;  Laterality: N/A;  . PERIPHERAL VASCULAR INTERVENTION  08/22/2019   Procedure: PERIPHERAL VASCULAR INTERVENTION;  Surgeon: Serafina Mitchell, MD;  Location: [place] [facility];  Service: Cardiovascular;;  Rt. SFA and Popliteal w/Shockwavw treatment  . POLYPECTOMY  09/20/2019   Procedure: POLYPECTOMY;  Surgeon: Danie Binder, MD;  [facility];  Service: Endoscopy;;  . SPINE SURGERY    . TONSILLECTOMY    . TRANSTHORACIC ECHOCARDIOGRAM  10/01/2010    Left ventricle: The cavity size was mildly dilated. Wall thickness was increased in a pattern of mild LVH. Systolic function was   mildly reduced. The estimated ejection fraction was in the range  of 45% to 50%.   . TUBAL LIGATION     Social History   Occupational History  . Occupation: Retired  Tobacco Use  . Smoking status: Former Smoker    Packs/day: 1.00    Years: 50.00    Pack years: 50.00    Types: Cigarettes    Quit date: 05/16/1998    Years since quitting: 21.4  . Smokeless tobacco: Never Used  Substance and Sexual Activity  . Alcohol use: No    Alcohol/week: 0.0 standard drinks  . Drug use: No  . Sexual activity: Yes    Birth  control/protection: Post-menopausal

## 2019-10-17 DIAGNOSIS — W19XXXA Unspecified fall, initial encounter: Secondary | ICD-10-CM | POA: Diagnosis not present

## 2019-10-17 DIAGNOSIS — S0003XA Contusion of scalp, initial encounter: Secondary | ICD-10-CM | POA: Diagnosis not present

## 2019-10-17 DIAGNOSIS — Z7401 Bed confinement status: Secondary | ICD-10-CM | POA: Diagnosis not present

## 2019-10-17 DIAGNOSIS — I509 Heart failure, unspecified: Secondary | ICD-10-CM | POA: Diagnosis not present

## 2019-10-17 DIAGNOSIS — I130 Hypertensive heart and chronic kidney disease with heart failure and stage 1 through stage 4 chronic kidney disease, or unspecified chronic kidney disease: Secondary | ICD-10-CM | POA: Diagnosis not present

## 2019-10-17 DIAGNOSIS — Z9010 Acquired absence of unspecified breast and nipple: Secondary | ICD-10-CM | POA: Diagnosis not present

## 2019-10-17 DIAGNOSIS — N2581 Secondary hyperparathyroidism of renal origin: Secondary | ICD-10-CM | POA: Diagnosis not present

## 2019-10-17 DIAGNOSIS — Z87891 Personal history of nicotine dependence: Secondary | ICD-10-CM | POA: Diagnosis not present

## 2019-10-17 DIAGNOSIS — W050XXA Fall from non-moving wheelchair, initial encounter: Secondary | ICD-10-CM | POA: Diagnosis not present

## 2019-10-17 DIAGNOSIS — Z743 Need for continuous supervision: Secondary | ICD-10-CM | POA: Diagnosis not present

## 2019-10-17 DIAGNOSIS — S0990XA Unspecified injury of head, initial encounter: Secondary | ICD-10-CM | POA: Diagnosis not present

## 2019-10-17 DIAGNOSIS — Z853 Personal history of malignant neoplasm of breast: Secondary | ICD-10-CM | POA: Diagnosis not present

## 2019-10-17 DIAGNOSIS — E1122 Type 2 diabetes mellitus with diabetic chronic kidney disease: Secondary | ICD-10-CM | POA: Diagnosis not present

## 2019-10-17 DIAGNOSIS — N186 End stage renal disease: Secondary | ICD-10-CM | POA: Diagnosis not present

## 2019-10-17 DIAGNOSIS — Z992 Dependence on renal dialysis: Secondary | ICD-10-CM | POA: Diagnosis not present

## 2019-10-17 DIAGNOSIS — R404 Transient alteration of awareness: Secondary | ICD-10-CM | POA: Diagnosis not present

## 2019-10-17 DIAGNOSIS — N189 Chronic kidney disease, unspecified: Secondary | ICD-10-CM | POA: Diagnosis not present

## 2019-10-17 DIAGNOSIS — S3993XA Unspecified injury of pelvis, initial encounter: Secondary | ICD-10-CM | POA: Diagnosis not present

## 2019-10-18 DIAGNOSIS — K219 Gastro-esophageal reflux disease without esophagitis: Secondary | ICD-10-CM | POA: Diagnosis not present

## 2019-10-18 DIAGNOSIS — M6281 Muscle weakness (generalized): Secondary | ICD-10-CM | POA: Diagnosis not present

## 2019-10-18 DIAGNOSIS — R1312 Dysphagia, oropharyngeal phase: Secondary | ICD-10-CM | POA: Diagnosis not present

## 2019-10-18 DIAGNOSIS — I4891 Unspecified atrial fibrillation: Secondary | ICD-10-CM | POA: Diagnosis not present

## 2019-10-18 DIAGNOSIS — I739 Peripheral vascular disease, unspecified: Secondary | ICD-10-CM | POA: Diagnosis not present

## 2019-10-19 DIAGNOSIS — N186 End stage renal disease: Secondary | ICD-10-CM | POA: Diagnosis not present

## 2019-10-19 DIAGNOSIS — N2581 Secondary hyperparathyroidism of renal origin: Secondary | ICD-10-CM | POA: Diagnosis not present

## 2019-10-19 DIAGNOSIS — Z992 Dependence on renal dialysis: Secondary | ICD-10-CM | POA: Diagnosis not present

## 2019-10-21 DIAGNOSIS — K219 Gastro-esophageal reflux disease without esophagitis: Secondary | ICD-10-CM | POA: Diagnosis not present

## 2019-10-21 DIAGNOSIS — M6281 Muscle weakness (generalized): Secondary | ICD-10-CM | POA: Diagnosis not present

## 2019-10-21 DIAGNOSIS — I4891 Unspecified atrial fibrillation: Secondary | ICD-10-CM | POA: Diagnosis not present

## 2019-10-21 DIAGNOSIS — I739 Peripheral vascular disease, unspecified: Secondary | ICD-10-CM | POA: Diagnosis not present

## 2019-10-21 DIAGNOSIS — R1312 Dysphagia, oropharyngeal phase: Secondary | ICD-10-CM | POA: Diagnosis not present

## 2019-10-22 DIAGNOSIS — M6281 Muscle weakness (generalized): Secondary | ICD-10-CM | POA: Diagnosis not present

## 2019-10-22 DIAGNOSIS — N2581 Secondary hyperparathyroidism of renal origin: Secondary | ICD-10-CM | POA: Diagnosis not present

## 2019-10-22 DIAGNOSIS — I4891 Unspecified atrial fibrillation: Secondary | ICD-10-CM | POA: Diagnosis not present

## 2019-10-22 DIAGNOSIS — K219 Gastro-esophageal reflux disease without esophagitis: Secondary | ICD-10-CM | POA: Diagnosis not present

## 2019-10-22 DIAGNOSIS — Z992 Dependence on renal dialysis: Secondary | ICD-10-CM | POA: Diagnosis not present

## 2019-10-22 DIAGNOSIS — I739 Peripheral vascular disease, unspecified: Secondary | ICD-10-CM | POA: Diagnosis not present

## 2019-10-22 DIAGNOSIS — N186 End stage renal disease: Secondary | ICD-10-CM | POA: Diagnosis not present

## 2019-10-22 DIAGNOSIS — R1312 Dysphagia, oropharyngeal phase: Secondary | ICD-10-CM | POA: Diagnosis not present

## 2019-10-23 DIAGNOSIS — K219 Gastro-esophageal reflux disease without esophagitis: Secondary | ICD-10-CM | POA: Diagnosis not present

## 2019-10-23 DIAGNOSIS — I4891 Unspecified atrial fibrillation: Secondary | ICD-10-CM | POA: Diagnosis not present

## 2019-10-23 DIAGNOSIS — I739 Peripheral vascular disease, unspecified: Secondary | ICD-10-CM | POA: Diagnosis not present

## 2019-10-23 DIAGNOSIS — M6281 Muscle weakness (generalized): Secondary | ICD-10-CM | POA: Diagnosis not present

## 2019-10-23 DIAGNOSIS — R1312 Dysphagia, oropharyngeal phase: Secondary | ICD-10-CM | POA: Diagnosis not present

## 2019-10-24 DIAGNOSIS — D649 Anemia, unspecified: Secondary | ICD-10-CM | POA: Diagnosis not present

## 2019-10-24 DIAGNOSIS — R1312 Dysphagia, oropharyngeal phase: Secondary | ICD-10-CM | POA: Diagnosis not present

## 2019-10-24 DIAGNOSIS — M6281 Muscle weakness (generalized): Secondary | ICD-10-CM | POA: Diagnosis not present

## 2019-10-24 DIAGNOSIS — K219 Gastro-esophageal reflux disease without esophagitis: Secondary | ICD-10-CM | POA: Diagnosis not present

## 2019-10-24 DIAGNOSIS — I4891 Unspecified atrial fibrillation: Secondary | ICD-10-CM | POA: Diagnosis not present

## 2019-10-24 DIAGNOSIS — Z992 Dependence on renal dialysis: Secondary | ICD-10-CM | POA: Diagnosis not present

## 2019-10-24 DIAGNOSIS — N2581 Secondary hyperparathyroidism of renal origin: Secondary | ICD-10-CM | POA: Diagnosis not present

## 2019-10-24 DIAGNOSIS — I739 Peripheral vascular disease, unspecified: Secondary | ICD-10-CM | POA: Diagnosis not present

## 2019-10-24 DIAGNOSIS — N186 End stage renal disease: Secondary | ICD-10-CM | POA: Diagnosis not present

## 2019-10-25 DIAGNOSIS — R1312 Dysphagia, oropharyngeal phase: Secondary | ICD-10-CM | POA: Diagnosis not present

## 2019-10-25 DIAGNOSIS — K219 Gastro-esophageal reflux disease without esophagitis: Secondary | ICD-10-CM | POA: Diagnosis not present

## 2019-10-25 DIAGNOSIS — I4891 Unspecified atrial fibrillation: Secondary | ICD-10-CM | POA: Diagnosis not present

## 2019-10-25 DIAGNOSIS — I739 Peripheral vascular disease, unspecified: Secondary | ICD-10-CM | POA: Diagnosis not present

## 2019-10-25 DIAGNOSIS — M6281 Muscle weakness (generalized): Secondary | ICD-10-CM | POA: Diagnosis not present

## 2019-10-26 DIAGNOSIS — N186 End stage renal disease: Secondary | ICD-10-CM | POA: Diagnosis not present

## 2019-10-26 DIAGNOSIS — Z992 Dependence on renal dialysis: Secondary | ICD-10-CM | POA: Diagnosis not present

## 2019-10-26 DIAGNOSIS — N2581 Secondary hyperparathyroidism of renal origin: Secondary | ICD-10-CM | POA: Diagnosis not present

## 2019-10-28 DIAGNOSIS — N189 Chronic kidney disease, unspecified: Secondary | ICD-10-CM | POA: Diagnosis not present

## 2019-10-28 DIAGNOSIS — K219 Gastro-esophageal reflux disease without esophagitis: Secondary | ICD-10-CM | POA: Diagnosis not present

## 2019-10-28 DIAGNOSIS — I739 Peripheral vascular disease, unspecified: Secondary | ICD-10-CM | POA: Diagnosis not present

## 2019-10-28 DIAGNOSIS — K922 Gastrointestinal hemorrhage, unspecified: Secondary | ICD-10-CM | POA: Diagnosis not present

## 2019-10-28 DIAGNOSIS — I4891 Unspecified atrial fibrillation: Secondary | ICD-10-CM | POA: Diagnosis not present

## 2019-10-28 DIAGNOSIS — E1122 Type 2 diabetes mellitus with diabetic chronic kidney disease: Secondary | ICD-10-CM | POA: Diagnosis not present

## 2019-10-28 DIAGNOSIS — M6281 Muscle weakness (generalized): Secondary | ICD-10-CM | POA: Diagnosis not present

## 2019-10-28 DIAGNOSIS — R1312 Dysphagia, oropharyngeal phase: Secondary | ICD-10-CM | POA: Diagnosis not present

## 2019-10-29 DIAGNOSIS — I4891 Unspecified atrial fibrillation: Secondary | ICD-10-CM | POA: Diagnosis not present

## 2019-10-29 DIAGNOSIS — Z992 Dependence on renal dialysis: Secondary | ICD-10-CM | POA: Diagnosis not present

## 2019-10-29 DIAGNOSIS — K219 Gastro-esophageal reflux disease without esophagitis: Secondary | ICD-10-CM | POA: Diagnosis not present

## 2019-10-29 DIAGNOSIS — M6281 Muscle weakness (generalized): Secondary | ICD-10-CM | POA: Diagnosis not present

## 2019-10-29 DIAGNOSIS — N2581 Secondary hyperparathyroidism of renal origin: Secondary | ICD-10-CM | POA: Diagnosis not present

## 2019-10-29 DIAGNOSIS — I739 Peripheral vascular disease, unspecified: Secondary | ICD-10-CM | POA: Diagnosis not present

## 2019-10-29 DIAGNOSIS — R1312 Dysphagia, oropharyngeal phase: Secondary | ICD-10-CM | POA: Diagnosis not present

## 2019-10-29 DIAGNOSIS — N186 End stage renal disease: Secondary | ICD-10-CM | POA: Diagnosis not present

## 2019-10-30 DIAGNOSIS — K219 Gastro-esophageal reflux disease without esophagitis: Secondary | ICD-10-CM | POA: Diagnosis not present

## 2019-10-30 DIAGNOSIS — M6281 Muscle weakness (generalized): Secondary | ICD-10-CM | POA: Diagnosis not present

## 2019-10-30 DIAGNOSIS — R1312 Dysphagia, oropharyngeal phase: Secondary | ICD-10-CM | POA: Diagnosis not present

## 2019-10-30 DIAGNOSIS — I739 Peripheral vascular disease, unspecified: Secondary | ICD-10-CM | POA: Diagnosis not present

## 2019-10-30 DIAGNOSIS — D649 Anemia, unspecified: Secondary | ICD-10-CM | POA: Diagnosis not present

## 2019-10-30 DIAGNOSIS — I4891 Unspecified atrial fibrillation: Secondary | ICD-10-CM | POA: Diagnosis not present

## 2019-10-31 DIAGNOSIS — N2581 Secondary hyperparathyroidism of renal origin: Secondary | ICD-10-CM | POA: Diagnosis not present

## 2019-10-31 DIAGNOSIS — I4891 Unspecified atrial fibrillation: Secondary | ICD-10-CM | POA: Diagnosis not present

## 2019-10-31 DIAGNOSIS — K219 Gastro-esophageal reflux disease without esophagitis: Secondary | ICD-10-CM | POA: Diagnosis not present

## 2019-10-31 DIAGNOSIS — Z992 Dependence on renal dialysis: Secondary | ICD-10-CM | POA: Diagnosis not present

## 2019-10-31 DIAGNOSIS — M6281 Muscle weakness (generalized): Secondary | ICD-10-CM | POA: Diagnosis not present

## 2019-10-31 DIAGNOSIS — R1312 Dysphagia, oropharyngeal phase: Secondary | ICD-10-CM | POA: Diagnosis not present

## 2019-10-31 DIAGNOSIS — N186 End stage renal disease: Secondary | ICD-10-CM | POA: Diagnosis not present

## 2019-10-31 DIAGNOSIS — I739 Peripheral vascular disease, unspecified: Secondary | ICD-10-CM | POA: Diagnosis not present

## 2019-11-01 DIAGNOSIS — E119 Type 2 diabetes mellitus without complications: Secondary | ICD-10-CM | POA: Diagnosis not present

## 2019-11-01 DIAGNOSIS — I739 Peripheral vascular disease, unspecified: Secondary | ICD-10-CM | POA: Diagnosis not present

## 2019-11-01 DIAGNOSIS — M6281 Muscle weakness (generalized): Secondary | ICD-10-CM | POA: Diagnosis not present

## 2019-11-01 DIAGNOSIS — K219 Gastro-esophageal reflux disease without esophagitis: Secondary | ICD-10-CM | POA: Diagnosis not present

## 2019-11-01 DIAGNOSIS — I4891 Unspecified atrial fibrillation: Secondary | ICD-10-CM | POA: Diagnosis not present

## 2019-11-01 DIAGNOSIS — R1312 Dysphagia, oropharyngeal phase: Secondary | ICD-10-CM | POA: Diagnosis not present

## 2019-11-02 DIAGNOSIS — R410 Disorientation, unspecified: Secondary | ICD-10-CM | POA: Diagnosis not present

## 2019-11-02 DIAGNOSIS — N189 Chronic kidney disease, unspecified: Secondary | ICD-10-CM | POA: Diagnosis not present

## 2019-11-02 DIAGNOSIS — Z7401 Bed confinement status: Secondary | ICD-10-CM | POA: Diagnosis not present

## 2019-11-02 DIAGNOSIS — Z9010 Acquired absence of unspecified breast and nipple: Secondary | ICD-10-CM | POA: Diagnosis not present

## 2019-11-02 DIAGNOSIS — R1010 Upper abdominal pain, unspecified: Secondary | ICD-10-CM | POA: Diagnosis not present

## 2019-11-02 DIAGNOSIS — R109 Unspecified abdominal pain: Secondary | ICD-10-CM | POA: Diagnosis not present

## 2019-11-02 DIAGNOSIS — Z853 Personal history of malignant neoplasm of breast: Secondary | ICD-10-CM | POA: Diagnosis not present

## 2019-11-02 DIAGNOSIS — I959 Hypotension, unspecified: Secondary | ICD-10-CM | POA: Diagnosis not present

## 2019-11-02 DIAGNOSIS — I509 Heart failure, unspecified: Secondary | ICD-10-CM | POA: Diagnosis not present

## 2019-11-02 DIAGNOSIS — N186 End stage renal disease: Secondary | ICD-10-CM | POA: Diagnosis not present

## 2019-11-02 DIAGNOSIS — Z992 Dependence on renal dialysis: Secondary | ICD-10-CM | POA: Diagnosis not present

## 2019-11-02 DIAGNOSIS — E1122 Type 2 diabetes mellitus with diabetic chronic kidney disease: Secondary | ICD-10-CM | POA: Diagnosis not present

## 2019-11-02 DIAGNOSIS — N2581 Secondary hyperparathyroidism of renal origin: Secondary | ICD-10-CM | POA: Diagnosis not present

## 2019-11-02 DIAGNOSIS — R404 Transient alteration of awareness: Secondary | ICD-10-CM | POA: Diagnosis not present

## 2019-11-02 DIAGNOSIS — Z87891 Personal history of nicotine dependence: Secondary | ICD-10-CM | POA: Diagnosis not present

## 2019-11-02 DIAGNOSIS — I130 Hypertensive heart and chronic kidney disease with heart failure and stage 1 through stage 4 chronic kidney disease, or unspecified chronic kidney disease: Secondary | ICD-10-CM | POA: Diagnosis not present

## 2019-11-02 DIAGNOSIS — I10 Essential (primary) hypertension: Secondary | ICD-10-CM | POA: Diagnosis not present

## 2019-11-04 DIAGNOSIS — I4891 Unspecified atrial fibrillation: Secondary | ICD-10-CM | POA: Diagnosis not present

## 2019-11-04 DIAGNOSIS — K219 Gastro-esophageal reflux disease without esophagitis: Secondary | ICD-10-CM | POA: Diagnosis not present

## 2019-11-04 DIAGNOSIS — I739 Peripheral vascular disease, unspecified: Secondary | ICD-10-CM | POA: Diagnosis not present

## 2019-11-04 DIAGNOSIS — R1312 Dysphagia, oropharyngeal phase: Secondary | ICD-10-CM | POA: Diagnosis not present

## 2019-11-04 DIAGNOSIS — M6281 Muscle weakness (generalized): Secondary | ICD-10-CM | POA: Diagnosis not present

## 2019-11-05 DIAGNOSIS — K219 Gastro-esophageal reflux disease without esophagitis: Secondary | ICD-10-CM | POA: Diagnosis not present

## 2019-11-05 DIAGNOSIS — Z992 Dependence on renal dialysis: Secondary | ICD-10-CM | POA: Diagnosis not present

## 2019-11-05 DIAGNOSIS — N2581 Secondary hyperparathyroidism of renal origin: Secondary | ICD-10-CM | POA: Diagnosis not present

## 2019-11-05 DIAGNOSIS — N186 End stage renal disease: Secondary | ICD-10-CM | POA: Diagnosis not present

## 2019-11-05 DIAGNOSIS — I4891 Unspecified atrial fibrillation: Secondary | ICD-10-CM | POA: Diagnosis not present

## 2019-11-05 DIAGNOSIS — R1312 Dysphagia, oropharyngeal phase: Secondary | ICD-10-CM | POA: Diagnosis not present

## 2019-11-05 DIAGNOSIS — I739 Peripheral vascular disease, unspecified: Secondary | ICD-10-CM | POA: Diagnosis not present

## 2019-11-05 DIAGNOSIS — M6281 Muscle weakness (generalized): Secondary | ICD-10-CM | POA: Diagnosis not present

## 2019-11-06 ENCOUNTER — Ambulatory Visit: Payer: Medicare Other | Admitting: Family

## 2019-11-06 DIAGNOSIS — I10 Essential (primary) hypertension: Secondary | ICD-10-CM | POA: Diagnosis not present

## 2019-11-06 DIAGNOSIS — E119 Type 2 diabetes mellitus without complications: Secondary | ICD-10-CM | POA: Diagnosis not present

## 2019-11-06 DIAGNOSIS — E785 Hyperlipidemia, unspecified: Secondary | ICD-10-CM | POA: Diagnosis not present

## 2019-11-06 DIAGNOSIS — D649 Anemia, unspecified: Secondary | ICD-10-CM | POA: Diagnosis not present

## 2019-11-07 DIAGNOSIS — N2581 Secondary hyperparathyroidism of renal origin: Secondary | ICD-10-CM | POA: Diagnosis not present

## 2019-11-07 DIAGNOSIS — Z992 Dependence on renal dialysis: Secondary | ICD-10-CM | POA: Diagnosis not present

## 2019-11-07 DIAGNOSIS — N186 End stage renal disease: Secondary | ICD-10-CM | POA: Diagnosis not present

## 2019-11-08 DIAGNOSIS — E1122 Type 2 diabetes mellitus with diabetic chronic kidney disease: Secondary | ICD-10-CM | POA: Diagnosis not present

## 2019-11-08 DIAGNOSIS — N189 Chronic kidney disease, unspecified: Secondary | ICD-10-CM | POA: Diagnosis not present

## 2019-11-08 DIAGNOSIS — I4891 Unspecified atrial fibrillation: Secondary | ICD-10-CM | POA: Diagnosis not present

## 2019-11-08 DIAGNOSIS — K922 Gastrointestinal hemorrhage, unspecified: Secondary | ICD-10-CM | POA: Diagnosis not present

## 2019-11-09 DIAGNOSIS — Z992 Dependence on renal dialysis: Secondary | ICD-10-CM | POA: Diagnosis not present

## 2019-11-09 DIAGNOSIS — N2581 Secondary hyperparathyroidism of renal origin: Secondary | ICD-10-CM | POA: Diagnosis not present

## 2019-11-09 DIAGNOSIS — N186 End stage renal disease: Secondary | ICD-10-CM | POA: Diagnosis not present

## 2019-11-12 DIAGNOSIS — N2581 Secondary hyperparathyroidism of renal origin: Secondary | ICD-10-CM | POA: Diagnosis not present

## 2019-11-12 DIAGNOSIS — D509 Iron deficiency anemia, unspecified: Secondary | ICD-10-CM | POA: Diagnosis not present

## 2019-11-12 DIAGNOSIS — N186 End stage renal disease: Secondary | ICD-10-CM | POA: Diagnosis not present

## 2019-11-12 DIAGNOSIS — Z992 Dependence on renal dialysis: Secondary | ICD-10-CM | POA: Diagnosis not present

## 2019-11-13 DIAGNOSIS — L89512 Pressure ulcer of right ankle, stage 2: Secondary | ICD-10-CM | POA: Diagnosis not present

## 2019-11-13 DIAGNOSIS — N186 End stage renal disease: Secondary | ICD-10-CM | POA: Diagnosis not present

## 2019-11-13 DIAGNOSIS — Z992 Dependence on renal dialysis: Secondary | ICD-10-CM | POA: Diagnosis not present

## 2019-11-14 DIAGNOSIS — N2581 Secondary hyperparathyroidism of renal origin: Secondary | ICD-10-CM | POA: Diagnosis not present

## 2019-11-14 DIAGNOSIS — Z992 Dependence on renal dialysis: Secondary | ICD-10-CM | POA: Diagnosis not present

## 2019-11-14 DIAGNOSIS — N186 End stage renal disease: Secondary | ICD-10-CM | POA: Diagnosis not present

## 2019-11-15 DIAGNOSIS — I4891 Unspecified atrial fibrillation: Secondary | ICD-10-CM | POA: Diagnosis not present

## 2019-11-15 DIAGNOSIS — I959 Hypotension, unspecified: Secondary | ICD-10-CM | POA: Diagnosis not present

## 2019-11-15 DIAGNOSIS — I2510 Atherosclerotic heart disease of native coronary artery without angina pectoris: Secondary | ICD-10-CM | POA: Diagnosis not present

## 2019-11-15 DIAGNOSIS — I739 Peripheral vascular disease, unspecified: Secondary | ICD-10-CM | POA: Diagnosis not present

## 2019-11-16 DIAGNOSIS — N186 End stage renal disease: Secondary | ICD-10-CM | POA: Diagnosis not present

## 2019-11-16 DIAGNOSIS — Z992 Dependence on renal dialysis: Secondary | ICD-10-CM | POA: Diagnosis not present

## 2019-11-16 DIAGNOSIS — N2581 Secondary hyperparathyroidism of renal origin: Secondary | ICD-10-CM | POA: Diagnosis not present

## 2019-11-19 DIAGNOSIS — N2581 Secondary hyperparathyroidism of renal origin: Secondary | ICD-10-CM | POA: Diagnosis not present

## 2019-11-19 DIAGNOSIS — Z992 Dependence on renal dialysis: Secondary | ICD-10-CM | POA: Diagnosis not present

## 2019-11-19 DIAGNOSIS — N186 End stage renal disease: Secondary | ICD-10-CM | POA: Diagnosis not present

## 2019-11-21 DIAGNOSIS — N2581 Secondary hyperparathyroidism of renal origin: Secondary | ICD-10-CM | POA: Diagnosis not present

## 2019-11-21 DIAGNOSIS — Z992 Dependence on renal dialysis: Secondary | ICD-10-CM | POA: Diagnosis not present

## 2019-11-21 DIAGNOSIS — N186 End stage renal disease: Secondary | ICD-10-CM | POA: Diagnosis not present

## 2019-11-23 DIAGNOSIS — Z992 Dependence on renal dialysis: Secondary | ICD-10-CM | POA: Diagnosis not present

## 2019-11-23 DIAGNOSIS — N2581 Secondary hyperparathyroidism of renal origin: Secondary | ICD-10-CM | POA: Diagnosis not present

## 2019-11-23 DIAGNOSIS — N186 End stage renal disease: Secondary | ICD-10-CM | POA: Diagnosis not present

## 2019-11-26 DIAGNOSIS — N186 End stage renal disease: Secondary | ICD-10-CM | POA: Diagnosis not present

## 2019-11-26 DIAGNOSIS — N2581 Secondary hyperparathyroidism of renal origin: Secondary | ICD-10-CM | POA: Diagnosis not present

## 2019-11-26 DIAGNOSIS — Z992 Dependence on renal dialysis: Secondary | ICD-10-CM | POA: Diagnosis not present

## 2019-11-26 DIAGNOSIS — D509 Iron deficiency anemia, unspecified: Secondary | ICD-10-CM | POA: Diagnosis not present

## 2019-11-28 DIAGNOSIS — N2581 Secondary hyperparathyroidism of renal origin: Secondary | ICD-10-CM | POA: Diagnosis not present

## 2019-11-28 DIAGNOSIS — Z992 Dependence on renal dialysis: Secondary | ICD-10-CM | POA: Diagnosis not present

## 2019-11-28 DIAGNOSIS — N186 End stage renal disease: Secondary | ICD-10-CM | POA: Diagnosis not present

## 2019-11-30 DIAGNOSIS — Z992 Dependence on renal dialysis: Secondary | ICD-10-CM | POA: Diagnosis not present

## 2019-11-30 DIAGNOSIS — N2581 Secondary hyperparathyroidism of renal origin: Secondary | ICD-10-CM | POA: Diagnosis not present

## 2019-11-30 DIAGNOSIS — N186 End stage renal disease: Secondary | ICD-10-CM | POA: Diagnosis not present

## 2019-12-03 DIAGNOSIS — N186 End stage renal disease: Secondary | ICD-10-CM | POA: Diagnosis not present

## 2019-12-03 DIAGNOSIS — Z992 Dependence on renal dialysis: Secondary | ICD-10-CM | POA: Diagnosis not present

## 2019-12-03 DIAGNOSIS — N2581 Secondary hyperparathyroidism of renal origin: Secondary | ICD-10-CM | POA: Diagnosis not present

## 2019-12-04 DIAGNOSIS — I4891 Unspecified atrial fibrillation: Secondary | ICD-10-CM | POA: Diagnosis not present

## 2019-12-04 DIAGNOSIS — E1140 Type 2 diabetes mellitus with diabetic neuropathy, unspecified: Secondary | ICD-10-CM | POA: Diagnosis not present

## 2019-12-04 DIAGNOSIS — M6281 Muscle weakness (generalized): Secondary | ICD-10-CM | POA: Diagnosis not present

## 2019-12-04 DIAGNOSIS — I5022 Chronic systolic (congestive) heart failure: Secondary | ICD-10-CM | POA: Diagnosis not present

## 2019-12-05 DIAGNOSIS — Z992 Dependence on renal dialysis: Secondary | ICD-10-CM | POA: Diagnosis not present

## 2019-12-05 DIAGNOSIS — N2581 Secondary hyperparathyroidism of renal origin: Secondary | ICD-10-CM | POA: Diagnosis not present

## 2019-12-05 DIAGNOSIS — N186 End stage renal disease: Secondary | ICD-10-CM | POA: Diagnosis not present

## 2019-12-07 DIAGNOSIS — N186 End stage renal disease: Secondary | ICD-10-CM | POA: Diagnosis not present

## 2019-12-07 DIAGNOSIS — Z992 Dependence on renal dialysis: Secondary | ICD-10-CM | POA: Diagnosis not present

## 2019-12-07 DIAGNOSIS — N2581 Secondary hyperparathyroidism of renal origin: Secondary | ICD-10-CM | POA: Diagnosis not present

## 2019-12-09 DIAGNOSIS — K922 Gastrointestinal hemorrhage, unspecified: Secondary | ICD-10-CM | POA: Diagnosis not present

## 2019-12-09 DIAGNOSIS — I5022 Chronic systolic (congestive) heart failure: Secondary | ICD-10-CM | POA: Diagnosis not present

## 2019-12-09 DIAGNOSIS — M6281 Muscle weakness (generalized): Secondary | ICD-10-CM | POA: Diagnosis not present

## 2019-12-09 DIAGNOSIS — I4891 Unspecified atrial fibrillation: Secondary | ICD-10-CM | POA: Diagnosis not present

## 2019-12-10 DIAGNOSIS — L89512 Pressure ulcer of right ankle, stage 2: Secondary | ICD-10-CM | POA: Diagnosis not present

## 2019-12-10 DIAGNOSIS — Z992 Dependence on renal dialysis: Secondary | ICD-10-CM | POA: Diagnosis not present

## 2019-12-10 DIAGNOSIS — N2581 Secondary hyperparathyroidism of renal origin: Secondary | ICD-10-CM | POA: Diagnosis not present

## 2019-12-10 DIAGNOSIS — N186 End stage renal disease: Secondary | ICD-10-CM | POA: Diagnosis not present

## 2019-12-10 DIAGNOSIS — D509 Iron deficiency anemia, unspecified: Secondary | ICD-10-CM | POA: Diagnosis not present

## 2019-12-12 DIAGNOSIS — Z992 Dependence on renal dialysis: Secondary | ICD-10-CM | POA: Diagnosis not present

## 2019-12-12 DIAGNOSIS — N2581 Secondary hyperparathyroidism of renal origin: Secondary | ICD-10-CM | POA: Diagnosis not present

## 2019-12-12 DIAGNOSIS — N186 End stage renal disease: Secondary | ICD-10-CM | POA: Diagnosis not present

## 2019-12-14 DIAGNOSIS — N2581 Secondary hyperparathyroidism of renal origin: Secondary | ICD-10-CM | POA: Diagnosis not present

## 2019-12-14 DIAGNOSIS — N186 End stage renal disease: Secondary | ICD-10-CM | POA: Diagnosis not present

## 2019-12-14 DIAGNOSIS — Z992 Dependence on renal dialysis: Secondary | ICD-10-CM | POA: Diagnosis not present

## 2019-12-30 ENCOUNTER — Other Ambulatory Visit: Payer: Self-pay

## 2019-12-30 ENCOUNTER — Encounter: Payer: Self-pay | Admitting: Surgery

## 2019-12-30 ENCOUNTER — Ambulatory Visit (INDEPENDENT_AMBULATORY_CARE_PROVIDER_SITE_OTHER): Payer: Medicare Other | Admitting: Surgery

## 2019-12-30 ENCOUNTER — Ambulatory Visit (HOSPITAL_COMMUNITY)
Admission: RE | Admit: 2019-12-30 | Discharge: 2019-12-30 | Disposition: A | Discharge status: Home / Self Care | Payer: Medicare Other | Source: Ambulatory Visit | Attending: Physician Assistant | Admitting: Physician Assistant

## 2019-12-30 ENCOUNTER — Ambulatory Visit (INDEPENDENT_AMBULATORY_CARE_PROVIDER_SITE_OTHER)
Admission: RE | Admit: 2019-12-30 | Discharge: 2019-12-30 | Disposition: A | Discharge status: Home / Self Care | Payer: Medicare Other | Source: Ambulatory Visit | Attending: Physician Assistant | Admitting: Physician Assistant

## 2019-12-30 VITALS — BP 152/66 | HR 69 | Temp 97.7°F | Resp 20 | Ht 66.0 in | Wt 214.0 lb

## 2019-12-30 DIAGNOSIS — I739 Peripheral vascular disease, unspecified: Secondary | ICD-10-CM | POA: Insufficient documentation

## 2019-12-30 DIAGNOSIS — I7025 Atherosclerosis of native arteries of other extremities with ulceration: Secondary | ICD-10-CM | POA: Diagnosis not present

## 2019-12-30 NOTE — Progress Notes (Signed)
[facility]  Patient name: Alexis [name] MRN: [id number] DOB: [birth year] Sex: female   REASON FOR VISIT:    Follow up  [place]:    Alexis [name] is a 74 y.o. female who presented in April 2021 with bilateral heel ulcers.  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  She was found to have severe peripheral vascular disease.  On 08/22/2019 she underwent angiography which revealed bilateral superficial femoral artery occlusion.  The right side was recanalized and treated with lithotripsy followed by drug-eluting stent placement.  She had two-vessel runoff via the peroneal and posterior tibial artery.  Her velocities on her prior ultrasound were on the low side and so she is back today for repeat study.  Patient suffers from end-stage renal disease, on dialysis Tuesday Thursday Saturday.  She has a history of atrial fibrillation on Eliquis.  She is medically managed for pulmonary hypertension.  She takes a statin for hyperlipidemia.  She is a diabetic.  She is a former smoker.  She is a history of congestive heart failure as well as a stroke. PAST MEDICAL HISTORY:   Past Medical History:  Diagnosis Date  . Arthritis   . Cancer [facility]) 2005    left breast  . CHF (congestive heart failure) ([place])   . Chronic back pain   . Chronic kidney disease 03/2014   dialysis t/th/sa  . Coronary artery disease   . Diabetes mellitus    Type 2  . Diabetic nephropathy ([place]) 01-08-13  . Dyslipidemia 01-08-13  . ESRD on hemodialysis ([place])    Tu, Th, Sat  . Headache   . Hyperlipidemia   . Hypertension   . LBBB (left bundle branch block)   . Proteinuria 01-08-13  . Pulmonary hypertension ([place])    PA peak pressure 73 mmHg 08/05/17 echo  . Thyroid disease 01-08-13   Hyper-parathyroidism-secondary  . Wears glasses      FAMILY HISTORY:   Family History  Problem Relation Age of Onset  . Breast cancer Mother         Died age 47  . Cancer Mother 4       Breast  . Heart disease Mother   . Hyperlipidemia Mother   . Hypertension Mother   . Heart attack Mother     SOCIAL HISTORY:   Social History   Tobacco Use  . Smoking status: Former Smoker    Packs/day: 1.00    Years: 50.00    Pack years: 50.00    Types: Cigarettes    Quit date: 05/16/1998    Years since quitting: 21.6  . Smokeless tobacco: Never Used  Substance Use Topics  . Alcohol use: No    Alcohol/week: 0.0 standard drinks     ALLERGIES:   Allergies  Allergen Reactions  . Olmesartan Other (See Comments)    Hyperkalemia      CURRENT MEDICATIONS:   Current Outpatient Medications  Medication Sig Dispense Refill  . acetaminophen (TYLENOL) 325 MG tablet Take 2 tablets (650 mg total) by mouth every 6 (six) hours as needed for mild pain, fever or headache (or Fever >/= 101). 30 tablet 1  . albuterol (VENTOLIN HFA) 108 (90 Base) MCG/ACT inhaler Inhale 1-2 puffs into the lungs every 6 (six) hours as needed for wheezing or shortness of breath.    Marland Kitchen amiodarone (PACERONE) 200 MG tablet Take 1 tablet (200 mg total) by mouth daily. (Patient taking differently: Take 200 mg by  mouth daily. (0900)) 90 tablet 3  . apixaban (ELIQUIS) 2.5 MG TABS tablet Take 1 tablet (2.5 mg total) by mouth 2 (two) times daily. (Patient taking differently: Take 2.5 mg by mouth 2 (two) times daily. 0900 & 2100) 60 tablet 0  . atorvastatin (LIPITOR) 80 MG tablet Take 1 tablet (80 mg total) by mouth daily. 90 tablet 3  . calcitRIOL (ROCALTROL) 0.25 MCG capsule Take 0.25 mcg by mouth Every Tuesday,Thursday,and Saturday with dialysis. (1700)    . clopidogrel (PLAVIX) 75 MG tablet Take 1 tablet (75 mg total) by mouth daily with breakfast.    . diclofenac Sodium (VOLTAREN) 1 % GEL Apply 2 g topically 4 (four) times daily as needed for pain.    Marland Kitchen insulin aspart (NOVOLOG) 100 UNIT/ML injection Substitute to any brand approved.Before each meal 3 times a day, 140-199 - 0  units, 200-250 - 0 units, 251-299 - 2 units,  300-349 - 4 units,  350 or above 6 units. Dispense syringes and needles as needed, Ok to switch to PEN if approved. DX DM2, Code E11.65 10 mL 11  . Insulin Lispro Prot & Lispro (HUMALOG MIX 75/25 KWIKPEN) (75-25) 100 UNIT/ML Kwikpen Inject 8 Units into the skin 2 (two) times daily before a meal. 15 mL 1  . lidocaine-prilocaine (EMLA) cream Apply 1 application topically Every Tuesday,Thursday,and Saturday with dialysis. 1 hour prior to dialysis    . midodrine (PROAMATINE) 10 MG tablet Take 1 tablet (10 mg total) by mouth 3 (three) times daily with meals. 90 tablet 3  . multivitamin (RENA-VIT) TABS tablet Take 1 tablet by mouth at bedtime.  0  . nitroGLYCERIN (NITROSTAT) 0.4 MG SL tablet Place 0.4 mg under the tongue every 5 (five) minutes x 3 doses as needed for chest pain.     Marland Kitchen ondansetron (ZOFRAN-ODT) 4 MG disintegrating tablet Take 4 mg by mouth every 6 (six) hours as needed for nausea or vomiting.     Marland Kitchen oxyCODONE (OXY IR/ROXICODONE) 5 MG immediate release tablet Take 1 tablet (5 mg total) by mouth every 6 (six) hours as needed for moderate pain or severe pain (pain score 4-6). 12 tablet 0  . pantoprazole (PROTONIX) 40 MG tablet Take 1 tablet (40 mg total) by mouth daily.    . polyethylene glycol (MIRALAX / GLYCOLAX) packet Take 17 g by mouth daily as needed for mild constipation.     . senna-docusate (SENOKOT-S) 8.6-50 MG tablet Take 2 tablets by mouth at bedtime. 60 tablet 1  . sevelamer (RENAGEL) 800 MG tablet Take 1,600 mg by mouth 3 (three) times daily.    . sevelamer carbonate (RENVELA) 800 MG tablet Take 1,600 mg by mouth 3 (three) times daily with meals.    . silver sulfADIAZINE (SILVADENE) 1 % cream Apply 1 application topically daily. 50 g 0  . traZODone (DESYREL) 50 MG tablet Take 25 mg by mouth at bedtime.     No current facility-administered medications for this visit.    REVIEW OF SYSTEMS:   [X]  denotes positive finding, [ ]  denotes  negative finding Cardiac  Comments:  Chest pain or chest pressure:    Shortness of breath upon exertion:    Short of breath when lying flat:    Irregular heart rhythm:        Vascular    Pain in calf, thigh, or hip brought on by ambulation:    Pain in feet at night that wakes you up from your sleep:     Blood clot in  your veins:    Leg swelling:         Pulmonary    Oxygen at home:    Productive cough:     Wheezing:         Neurologic    Sudden weakness in arms or legs:     Sudden numbness in arms or legs:     Sudden onset of difficulty speaking or slurred speech:    Temporary loss of vision in one eye:     Problems with dizziness:         Gastrointestinal    Blood in stool:     Vomited blood:         Genitourinary    Burning when urinating:     Blood in urine:    x    Psychiatric    Major depression:         Hematologic    Bleeding problems:    Problems with blood clotting too easily:        Skin    Rashes or ulcers:        Constitutional    Fever or chills:      PHYSICAL EXAM:   Vitals:   12/30/19 1120  BP: (!) 152/66  Pulse: 69  Resp: 20  Temp: 97.7 F (36.5 C)  SpO2: 95%  Weight: 214 lb (97.1 kg)  Height: 5\' 6"  (1.676 m)    GENERAL: The patient is a well-nourished female, in no acute distress. The vital signs are documented above. CARDIAC: There is a regular rate and rhythm.  VASCULAR: Nonpalpable pedal pulses PULMONARY: Non-labored respirations ABDOMEN: Soft and non-tender with normal pitched bowel sounds.  MUSCULOSKELETAL: There are no major deformities or cyanosis. NEUROLOGIC: No focal weakness or paresthesias are detected. SKIN: Right lateral malleolar ulcer has nearly resolved PSYCHIATRIC: The patient has a normal affect.  STUDIES:   I have reviewed the following ultrasound:  Lower extremity duplex: Right: Patent stent with limited visability in the mid segment.   ABI Findings:    +---------+------------------+-----+--------+---------+  Right  Rt Pressure (mmHg)IndexWaveformComment   +---------+------------------+-----+--------+---------+  Brachial                 AVF     +---------+------------------+-----+--------+---------+  PTA   .254       1.63            +---------+------------------+-----+--------+---------+  DP    >253       1.62            +---------+------------------+-----+--------+---------+  Great Toe180        1.15     calcified  +---------+------------------+-----+--------+---------+ MEDICAL ISSUES:   The patient has had a significant improvement in the right lateral malleolar ulcer.  It has nearly healed.  Duplex today is essentially unchanged.  She does have low velocities within her stent however this was a very difficult study as it was done while she was sitting in a chair.  I Georgina Peer bring her back in 3 months for a repeat study.  If she continues to have velocities less than 40, she will need angiography.    Leia Alf, MD, FACS [facility] [phone] Pager [phone]

## 2020-01-01 ENCOUNTER — Other Ambulatory Visit: Payer: Self-pay | Admitting: *Deleted

## 2020-01-01 DIAGNOSIS — I7025 Atherosclerosis of native arteries of other extremities with ulceration: Secondary | ICD-10-CM

## 2020-01-08 ENCOUNTER — Telehealth: Payer: Self-pay | Admitting: Orthopedic Surgery

## 2020-01-08 NOTE — Telephone Encounter (Signed)
Patient's daughter  [name] called requesting mother has a prescription for prostatic leg. Explained to patient's daughter Dr. Sharol Given will need to see patient to measure patient for leg and that Dr. Sharol Given nurse will call back with more information before setting appt. Patient's daughter phone number is [phone].

## 2020-01-08 NOTE — Telephone Encounter (Signed)
Called and sw pt's daughter and advised last eval in the office was 10/2019 needed a three weeks follow up at that time. Made appt for 01/22/20 at 9:30

## 2020-01-22 ENCOUNTER — Ambulatory Visit: Payer: Medicare Other | Admitting: Family

## 2020-01-22 ENCOUNTER — Ambulatory Visit: Payer: Medicare Other | Admitting: Gastroenterology

## 2020-01-24 ENCOUNTER — Ambulatory Visit: Payer: Medicare Other | Admitting: Gastroenterology

## 2020-02-27 NOTE — Progress Notes (Signed)
Referring Provider: No ref. provider found Primary Care Physician:  System, Provider Not In Primary GI Physician: Dr. Abbey Chatters  Chief Complaint  Patient presents with  . Abdominal Pain    diarrhea x 20 days    HPI:   Alexis [name] is a 74 y.o. female with history of A. fib on Eliquis, ESRD on HD, CHF, pulmonary hypertension, CAD, diabetes, left AKA for calcaneal osteomyelitis and right leg DES placement in April 2021 started on dual antiplatelet therapy presenting today for hospital follow.  Patient was admitted 09/12/2019-09/25/2019 with hypoglycemia, anemia, and suspected upper GI bleed in the setting of melena.  She was at hemodialysis where she became cool, clammy, and had decreased responsiveness.  She was found to have blood sugar less than 10 upon ED arrival.  She was also hypotensive, stool guaiac positive, patient and ED nursing staff reported melena, hemoglobin at baseline around 9-11.  EGD 5/2 with possible esophageal candidiasis versus dissolved pill (KOH prep negative), single gastric polyp with no bleeding, no stigmata of recent bleed, erythematous mucosa in the gastric antrum, single nonbleeding angiodysplastic lesion in the duodenum. Patient was not able to swallow agile capsule.  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  Stated it was okay to resume Eliquis, monitor for bleeding, consider enteroscopy/APC if patient rebleeds. On her repeat EGD, she was found to have LA grade C esophagitis suspected to be medication induced with friable mucosa and 8 mm hyperplastic polyp removed from cardia s/p clipping x2 to prevent bleeding. Recommended Protonix BID and switching as many medications to elixir as possible.  Last colonoscopy in 2016 at [facility] and was normal with recommendations to repeat in 10 years (see. Walden Field, NP progress note 09/16/2019 when records were obtained).  Labs 11/02/19 in [place] with hemoglobin 10.0.     Today:  Presents with her daughter today.  Patient is able to provide history. Patient is now living at the [facility].  Anemia: Denies bright red blood per rectum or melena.  No other overt bleeding including nosebleeds, vaginal bleeding, hematuria.  She has been having diarrhea for about 20 days. Has a bad smell. Stools are watery. 2-3 BMs a day. Wakes in the middle of the night at times to have a BM. Taking 2 imodium a day. Per med list sent with patient/MAR, looks like she had imodium on October 3, 7, 10, and 11. Antibiotics in June for pneumonia.  She is not aware of anyone else at the [facility] with similar symptoms.  No abdominal pain associated with diarrhea specifically. No fever. Mild intermittent nausea started this week. No vomiting.   Pain in the upper abdomen. This has been present for several months. Can be worsened with eating but not always.  No identified food triggers. No trouble swallowing.  Taking medications with a very small sip of water and frequently when she is laying down. No GERD symptoms. She is taking Protonix 40 mg daily.  Had cereal this morning.   Denies lightheadedness, dizziness, presyncope, syncope, chest pain, heart palpitations, or shortness of breath.  States she feels like she is getting a cold.  She has had an intermittent cough and a runny nose.  No known sick contacts.   Past Medical History:  Diagnosis Date  . Arthritis   . Cancer [facility]) 2005    left breast  . CHF (congestive heart failure) ([place])   . Chronic back pain   . Chronic kidney disease  03/2014   dialysis t/th/sa  . Coronary artery disease   . Diabetes mellitus    Type 2  . Diabetic nephropathy ([place]) 01-08-13  . Dyslipidemia 01-08-13  . ESRD on hemodialysis ([place])    Tu, Th, Sat  . Headache   . Hyperlipidemia   . Hypertension   . LBBB (left bundle branch block)   . Proteinuria 01-08-13  . Pulmonary hypertension ([place])    PA peak pressure 73 mmHg 08/05/17 echo  . Thyroid disease  01-08-13   Hyper-parathyroidism-secondary  . Wears glasses     Past Surgical History:  Procedure Laterality Date  . A/V FISTULAGRAM N/A 08/25/2017   Procedure: A/V FISTULAGRAM - Left Arm;  Surgeon: Angelia Mould, MD;  Location: [place] CV LAB;  Service: Cardiovascular;  Laterality: N/A;  . A/V FISTULAGRAM N/A 02/09/2018   Procedure: A/V [id number];  Surgeon: Elam Dutch, MD;  Location: [place] [facility];  Service: Cardiovascular;  Laterality: N/A;  . A/V FISTULAGRAM Left 04/04/2018   Procedure: A/V FISTULAGRAM;  Surgeon: Marty Heck, MD;  Location: [place] [facility];  Service: Cardiovascular;  Laterality: Left;  . A/V FISTULAGRAM Right 07/22/2019   Procedure: A/V FISTULAGRAM - Right Arm;  Surgeon: Waynetta Sandy, MD;  Location: [place] [facility] LAB;  Service: Cardiovascular;  Laterality: Right;  . ABDOMINAL AORTAGRAM N/A 08/11/2014   Procedure: ABDOMINAL Maxcine Ham;  Surgeon: Angelia Mould, MD;  Location: [facility] CATH LAB;  Service: Cardiovascular;  Laterality: N/A;  . ABDOMINAL AORTOGRAM W/LOWER EXTREMITY N/A 08/22/2019   Procedure: ABDOMINAL AORTOGRAM W/LOWER EXTREMITY;  Surgeon: Serafina Mitchell, MD;  Location: [place] CV LAB;  Service: Cardiovascular;  Laterality: N/A;  . ABDOMINAL HYSTERECTOMY    . AGILE CAPSULE N/A 09/17/2019   Procedure: AGILE CAPSULE;  Surgeon: Danie Binder, MD;  [facility];  Service: Endoscopy;  Laterality: N/A;  . AGILE CAPSULE N/A 09/18/2019   Procedure: AGILE CAPSULE;  Surgeon: Daneil Dolin, MD;  [facility];  Service: Endoscopy;  Laterality: N/A;  . AMPUTATION Left 08/25/2019   Procedure: LEFT AMPUTATION ABOVE KNEE;  Surgeon: Newt Minion, MD;  Location: [place];  Service: Orthopedics;  Laterality: Left;  . APPENDECTOMY    . Arm surgery     Left arm trauma  . AV FISTULA PLACEMENT Left 08/21/2014   Procedure: LEFT ARM ARTERIOVENOUS (AV) FISTULA CREATION ;  Surgeon: Angelia Mould, MD;   Location: [facility] OR;  Service: Vascular;  Laterality: Left;  . AV FISTULA PLACEMENT Right 06/13/2018   Procedure: insertion of right arm ARTERIOVENOUS (AV) gore-tex GRAFT;  Surgeon: Rosetta Posner, MD;  Location: [place];  Service: Vascular;  Laterality: Right;  . BACK SURGERY    . CATARACT EXTRACTION W/PHACO Left 12/22/2014   Procedure: CATARACT EXTRACTION PHACO AND INTRAOCULAR LENS PLACEMENT (IOC);  Surgeon: Tonny Branch, MD;  [facility]: [facility];  Service: Ophthalmology;  Laterality: Left;  CDE 13.48  . CORONARY ANGIOPLASTY  12/19/2003   inferior wall hypokinesis. ef 50%  . dialysis catheter    . ESOPHAGOGASTRODUODENOSCOPY N/A 09/15/2019   Procedure: ESOPHAGOGASTRODUODENOSCOPY (EGD);  Surgeon: Rogene Houston, MD; Mucosal coating at distal esophagus (KOH negative), single gastric polyp, erythematous mucosa at the antrum, single nonbleeding angiodysplastic lesion in the duodenum.  . ESOPHAGOGASTRODUODENOSCOPY N/A 09/18/2019   Procedure: ESOPHAGOGASTRODUODENOSCOPY (EGD);  Surgeon: Daneil Dolin, MD;  Distal esophagus with overlying exudate almost certainly chemical induced with underlying friable bleeding mucosa, small hiatal hernia, Givens capsule placed into the  duodenum.  . ESOPHAGOGASTRODUODENOSCOPY (EGD) WITH PROPOFOL N/A 09/20/2019   Procedure: ESOPHAGOGASTRODUODENOSCOPY (EGD) WITH PROPOFOL;  Surgeon: Danie Binder, MD;   LA grade C esophagitis, 8 mm gastric polyp (hyperplastic) resected s/p clipping x2, moderate gastritis.  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  . INSERTION OF DIALYSIS CATHETER Right 06/13/2018   Procedure: INSERTION OF DIALYSIS CATHETER, right internal jugular;  Surgeon: Rosetta Posner, MD;  Location: [place];  Service: Vascular;  Laterality: Right;  . IR FLUORO GUIDE CV LINE RIGHT  04/06/2018  . IR REMOVAL TUN CV CATH W/O FL  04/25/2018  . IR REMOVAL TUN CV CATH W/O FL  07/20/2018  .  IR US GUIDE VASC ACCESS RIGHT  04/06/2018  . LIGATION OF ARTERIOVENOUS  FISTULA Left 03/18/2019   Procedure: LIGATION OF ARTERIOVENOUS  FISTULA  LEFT ARM;  Surgeon: Angelia Mould, MD;  Location: [place];  Service: Vascular;  Laterality: Left;  Marland Kitchen MASTECTOMY     Left  . PERIPHERAL VASCULAR BALLOON ANGIOPLASTY Left 08/25/2017   Procedure: PERIPHERAL VASCULAR BALLOON ANGIOPLASTY;  Surgeon: Angelia Mould, MD;  Location: [place] CV LAB;  Service: Cardiovascular;  Laterality: Left;  arm fistula  . PERIPHERAL VASCULAR BALLOON ANGIOPLASTY Left 02/09/2018   Procedure: PERIPHERAL VASCULAR BALLOON ANGIOPLASTY;  Surgeon: Elam Dutch, MD;  Location: [place] [facility];  Service: Cardiovascular;  Laterality: Left;  AV Fistula  . PERIPHERAL VASCULAR BALLOON ANGIOPLASTY Left 04/04/2018   Procedure: PERIPHERAL VASCULAR BALLOON ANGIOPLASTY;  Surgeon: Marty Heck, MD;  Location: [place] CV LAB;  Service: Cardiovascular;  Laterality: Left;  UPPER ARM FISTULA  . PERIPHERAL VASCULAR CATHETERIZATION N/A 09/16/2015   Procedure: Fistulagram;  Surgeon: Rosetta Posner, MD;  Location: [place] [facility];  Service: Cardiovascular;  Laterality: N/A;  . PERIPHERAL VASCULAR INTERVENTION  08/22/2019   Procedure: PERIPHERAL VASCULAR INTERVENTION;  Surgeon: Serafina Mitchell, MD;  Location: [place] [employer] [facility];  Service: Cardiovascular;;  Rt. SFA and Popliteal w/Shockwavw treatment  . POLYPECTOMY  09/20/2019   Procedure: POLYPECTOMY;  Surgeon: Danie Binder, MD;  [facility];  Service: Endoscopy;;  . SPINE SURGERY    . TONSILLECTOMY    . TRANSTHORACIC ECHOCARDIOGRAM  10/01/2010    Left ventricle: The cavity size was mildly dilated. Wall thickness was increased in a pattern of mild LVH. Systolic function was   mildly reduced. The estimated ejection fraction was in the range  of 45% to 50%.   . TUBAL LIGATION      Current Outpatient Medications  Medication Sig Dispense Refill  .  acetaminophen (TYLENOL) 325 MG tablet Take 2 tablets (650 mg total) by mouth every 6 (six) hours as needed for mild pain, fever or headache (or Fever >/= 101). (Patient taking differently: Take 650 mg by mouth as needed for mild pain, fever or headache (or Fever >/= 101). ) 30 tablet 1  . amiodarone (PACERONE) 200 MG tablet Take 1 tablet (200 mg total) by mouth daily. (Patient taking differently: Take 200 mg by mouth daily. (0900)) 90 tablet 3  . apixaban (ELIQUIS) 2.5 MG TABS tablet Take 1 tablet (2.5 mg total) by mouth 2 (two) times daily. 60 tablet 0  . atorvastatin (LIPITOR) 80 MG tablet Take 1 tablet (80 mg total) by mouth daily. 90 tablet 3  . clopidogrel (PLAVIX) 75 MG tablet Take 1 tablet (75 mg total) by mouth  daily with breakfast. (Patient taking differently: Take 75 mg by mouth at bedtime. )    . Insulin Lispro Prot & Lispro (HUMALOG MIX 75/25 KWIKPEN) (75-25) 100 UNIT/ML Kwikpen Inject 8 Units into the skin 2 (two) times daily before a meal. 15 mL 1  . lidocaine-prilocaine (EMLA) cream Apply 1 application topically Every Tuesday,Thursday,and Saturday with dialysis. 1 hour prior to dialysis    . loperamide (IMODIUM) 2 MG capsule Take 2 mg by mouth as needed for diarrhea or loose stools. Takes 2 tablets as needed.    . midodrine (PROAMATINE) 10 MG tablet Take 1 tablet (10 mg total) by mouth 3 (three) times daily with meals. 90 tablet 3  . multivitamin (RENA-VIT) TABS tablet Take 1 tablet by mouth at bedtime.  0  . ondansetron (ZOFRAN-ODT) 4 MG disintegrating tablet Take 4 mg by mouth as needed for nausea or vomiting.     . sertraline (ZOLOFT) 25 MG tablet Take 25 mg by mouth daily.    . sevelamer (RENAGEL) 800 MG tablet Take 1,600 mg by mouth 3 (three) times daily.    . sevelamer carbonate (RENVELA) 800 MG tablet Take 1,600 mg by mouth 3 (three) times daily with meals.    . traZODone (DESYREL) 50 MG tablet Take 25 mg by mouth at bedtime.    Marland Kitchen albuterol (VENTOLIN HFA) 108 (90 Base) MCG/ACT  inhaler Inhale 1-2 puffs into the lungs every 6 (six) hours as needed for wheezing or shortness of breath. (Patient not taking: Reported on 02/28/2020)    . calcitRIOL (ROCALTROL) 0.25 MCG capsule Take 0.25 mcg by mouth Every Tuesday,Thursday,and Saturday with dialysis. (1700) (Patient not taking: Reported on 02/28/2020)    . diclofenac Sodium (VOLTAREN) 1 % GEL Apply 2 g topically 4 (four) times daily as needed for pain. (Patient not taking: Reported on 02/28/2020)    . nitroGLYCERIN (NITROSTAT) 0.4 MG SL tablet Place 0.4 mg under the tongue every 5 (five) minutes x 3 doses as needed for chest pain.  (Patient not taking: Reported on 02/28/2020)    . pantoprazole (PROTONIX) 40 MG tablet Take 1 tablet (40 mg total) by mouth 2 (two) times daily before a meal. 180 tablet 1  . polyethylene glycol (MIRALAX / GLYCOLAX) packet Take 17 g by mouth daily as needed for mild constipation.  (Patient not taking: Reported on 02/28/2020)    . senna-docusate (SENOKOT-S) 8.6-50 MG tablet Take 2 tablets by mouth at bedtime. (Patient not taking: Reported on 02/29/2020) 60 tablet 1  . silver sulfADIAZINE (SILVADENE) 1 % cream Apply 1 application topically daily. (Patient not taking: Reported on 02/28/2020) 50 g 0   No current facility-administered medications for this visit.    Allergies as of 02/28/2020 - Review Complete 02/28/2020  Allergen Reaction Noted  . Olmesartan Other (See Comments) 10/23/2012    Family History  Problem Relation Age of Onset  . Breast cancer Mother        Died age 53  . Cancer Mother 45       Breast  . Heart disease Mother   . Hyperlipidemia Mother   . Hypertension Mother   . Heart attack Mother     Social History   Socioeconomic History  . Marital status: Legally Separated    Spouse name: Not on file  . Number of children: 3  . Years of education: Not on file  . Highest education level: Not on file  Occupational History  . Occupation: Retired  Tobacco Use  . Smoking  status:  Former Smoker    Packs/day: 1.00    Years: 50.00    Pack years: 50.00    Types: Cigarettes    Quit date: 05/16/1998    Years since quitting: 21.8  . Smokeless tobacco: Never Used  Vaping Use  . Vaping Use: Never used  Substance and Sexual Activity  . Alcohol use: No    Alcohol/week: 0.0 standard drinks  . Drug use: No  . Sexual activity: Yes    Birth control/protection: Post-menopausal  Other Topics Concern  . Not on file  Social History Narrative   Lives at [place] alone (most of the time).  Three grandchildren and a one year old great grandchild   Social Determinants of Health   Financial Resource Strain:   . Difficulty of Paying Living Expenses: Not on file  Food Insecurity:   . Worried About Charity fundraiser in the Last Year: Not on file  . Ran Out of Food in the Last Year: Not on file  Transportation Needs:   . Lack of Transportation (Medical): Not on file  . Lack of Transportation (Non-Medical): Not on file  Physical Activity:   . Days of Exercise per Week: Not on file  . Minutes of Exercise per Session: Not on file  Stress:   . Feeling of Stress : Not on file  Social Connections:   . Frequency of Communication with Friends and Family: Not on file  . Frequency of Social Gatherings with Friends and Family: Not on file  . Attends Religious Services: Not on file  . Active Member of Clubs or Organizations: Not on file  . Attends Archivist Meetings: Not on file  . Marital Status: Not on file    Review of Systems: Gen: See HPI CV: See HPI Resp: See HPI GI: See HPI Heme: See HPI  Physical Exam: BP (!) 111/55   Pulse 79   Temp (!) 97.3 F (36.3 C) (Temporal)   Ht 5\' 6"  (1.676 m)   BMI 34.54 kg/m  General:   Alert and oriented. No distress noted. Pleasant and cooperative. In a wheel chair.  Head:  Normocephalic and atraumatic. Eyes:  Conjuctiva clear without scleral icterus. Heart:  S1, S2 present without murmurs appreciated. Lungs:  Clear  to auscultation bilaterally. No wheezes, rales, or rhonchi. No distress.  Abdomen:  +BS, soft, and non-distended. TTP in epigastric area. No rebound or guarding. No HSM or masses noted. Msk: Left AKA. Extremities:  Without edema. Neurologic:  Alert and  oriented x3 Psych:  Normal mood and affect.

## 2020-02-28 ENCOUNTER — Other Ambulatory Visit: Payer: Self-pay

## 2020-02-28 ENCOUNTER — Ambulatory Visit (INDEPENDENT_AMBULATORY_CARE_PROVIDER_SITE_OTHER): Payer: Medicare Other | Admitting: Gastroenterology

## 2020-02-28 ENCOUNTER — Encounter: Payer: Self-pay | Admitting: Gastroenterology

## 2020-02-28 DIAGNOSIS — R197 Diarrhea, unspecified: Secondary | ICD-10-CM | POA: Diagnosis not present

## 2020-02-28 DIAGNOSIS — R1013 Epigastric pain: Secondary | ICD-10-CM | POA: Insufficient documentation

## 2020-02-28 DIAGNOSIS — D649 Anemia, unspecified: Secondary | ICD-10-CM | POA: Diagnosis not present

## 2020-02-28 MED ORDER — PANTOPRAZOLE SODIUM 40 MG PO TBEC: 40.0000 mg | DELAYED_RELEASE_TABLET | Freq: Two times a day (BID) | ORAL | 1 refills | Status: AC

## 2020-02-28 NOTE — Patient Instructions (Signed)
Please complete labs and stool studies.  Results will need to be faxed to our office if completed at the [facility].  Follow a low-fat diet.  Avoid fried, fatty, greasy foods.  Follow a lactose-free diet for now in the setting of diarrhea.  Please increase Protonix to 40 mg twice daily 30 minutes before breakfast and 30 minutes before dinner.  Please remain upright at least 30 minutes to 1 hour after taking all medications.  Be sure to drink at least 8 ounces of water after taking medications.  Further recommendations to follow.  Aliene Altes, PA-C [facility]

## 2020-02-29 ENCOUNTER — Encounter: Payer: Self-pay | Admitting: Gastroenterology

## 2020-02-29 NOTE — Assessment & Plan Note (Addendum)
Several months of epigastric abdominal pain.  During hospitalization in late April/May for report of melena and heme positive stool, patient underwent EGD which revealed LA grade C esophagitis suspected to be medication induced with friable mucosa, 8 mm hyperplastic polyp removed s/p clipping x2 to prevent bleeding.  She was recommended to be on Protonix twice daily but she is currently only taking Protonix daily.  Also reports taking medications with very small sip of liquid and frequently laying down right after taking medications.  Suspect upper abdominal pain is likely secondary to pill induced esophagitis.   Plan: Increase Protonix to 40 mg twice daily. Drink at least 8 ounces with all medications. Stay upright at least 30 minutes - 1 hour after taking medications.

## 2020-02-29 NOTE — Assessment & Plan Note (Addendum)
74 year old female residing at the [facility] who reports 20 days of watery diarrhea with nocturnal stools currently taking Imodium which has not stopped the diarrhea.  She was hospitalized in late April/May and she was on antibiotics for pneumonia in June for pneumonia.  Denies sick contacts or other residents at the [facility] with similar symptoms.  New onset of intermittent nausea without vomiting this week.  Denies any significant abdominal pain specifically associated with diarrhea.  She does have several months of upper abdominal pain as discussed below.  Denies BRBPR or melena.  We will need to rule out C. difficile along with other infectious etiologies.  We will also screen for celiac disease and thyroid disturbances.   Plan: C. difficile and GI pathogen panel. IgA, TTG IgA TSH Follow a low-fat diet and lactose-free diet for now in the setting of diarrhea. Further recommendations to follow

## 2020-02-29 NOTE — Assessment & Plan Note (Addendum)
Anemia of chronic disease in the setting of ESRD on HD with baseline hemoglobin in the 9-11 range.  She had a mild drop in hemoglobin in April-May as low as 7.8 in the setting of melena and heme positive stool.  EGD and Givens capsule completed which revealed LA grade C esophagitis suspected to be medication induced with friable mucosa, 8 mm hyperplastic polyp removed from the cardia s/p clipping x2, and duodenal AVM.  She very well may have bled from gastric polyp, esophagitis, or duodenal AVM as she is chronically anticoagulated on Eliquis and also on dual antiplatelet therapy.  Recommended PPI twice daily and consider enteroscopy/APC if patient rebleeds. Currently only on PPI daily.  Patient denies any further melena.  Also denies BRBPR or other overt bleeding.  Last colonoscopy in 2016 at [facility] with a normal exam and recommended repeat in 10 years.  Most recent hemoglobin improved to 10 on 11/02/2019 (Care Everywhere).   No further GI evaluation is needed at this time.  We will plan to repeat hemoglobin to monitor for stability.  Advised patient to continue to monitor for bright red blood per rectum or black stools and let us know if this occurs. We will increase PPI to BID for now as she continues with epigastric abdominal pain (as per above) which I suspect is secondary to ongoing pill induced esophagitis.

## 2020-03-17 ENCOUNTER — Telehealth: Payer: Self-pay | Admitting: Internal Medicine

## 2020-03-17 NOTE — Telephone Encounter (Signed)
[facility] TO SPEAK TO NURSE ABOUT PATIENT. THEY WILL FAX HER LABS HERE.  NEED TO FIND OUT THE NEXT STEP IN HER CARE

## 2020-03-17 NOTE — Telephone Encounter (Signed)
PHONED TO [place] SPOKE WITH BETTY AND WAS ADVISED SHE FAXED PTS LAB RESULTS TO [facility]. SHE WAS NOT AT THE FACILITY AT THE MOMENT BUT WILL RE-FAX IF NEEDED. SHE ALSO STATES THAT THE PTS HEMOGLOBIN IS 7.4 AND SHE IS ALSO ON DIALYSIS EVERY TUES., THURS., AND SAT. WILL CHECK WITH [facility].

## 2020-03-19 ENCOUNTER — Telehealth: Payer: Self-pay

## 2020-03-19 NOTE — Telephone Encounter (Signed)
[facility] stating they need for you to contact regarding the pts aftercare.

## 2020-03-20 ENCOUNTER — Encounter (HOSPITAL_COMMUNITY): Payer: Medicare Other

## 2020-03-20 ENCOUNTER — Other Ambulatory Visit: Payer: Self-pay

## 2020-03-20 DIAGNOSIS — I739 Peripheral vascular disease, unspecified: Secondary | ICD-10-CM

## 2020-03-23 ENCOUNTER — Telehealth: Payer: Self-pay | Admitting: Gastroenterology

## 2020-03-24 NOTE — Telephone Encounter (Signed)
Does not appear that I have ever seen this patient.  Forwarding message to Romeo.  Thank you

## 2020-03-24 NOTE — Telephone Encounter (Signed)
noted 

## 2020-03-24 NOTE — Telephone Encounter (Signed)
I called the [facility] this afternoon and spoke with patient's nurse Renee.  Joseph Art states she works [employer] and was not able to provide me with much information.  She did state that we were called due to patient being on comfort care.  Patient has been declining.  She is missing dialysis or leaving dialysis early.  Per Joseph Art, patient's daughter stated today, it is up to the patient whether or not she will continue dialysis or stop.    I have received labs completed 10/22.  CBC: WBC 9.5, hemoglobin 7.4, hematocrit 22.8, MCV 80.7, MCH 26.1, MCHC 32.3, platelets 224 BMP: Glucose 91, calcium 8.6, creatinine 2.96, BUN 13.1, sodium 142, potassium 2.8, chloride 101, carbon dioxide 30 Lipid panel: Cholesterol 87, triglycerides 78, HDL 36, LDL 36, VLDL 16  Nurse Renee was not able to tell me if patient has had repeat hemoglobin since 10/22.  Renee states patient has not had any overt GI bleeding.  She is not able to tell me if patient's potassium was replaced.  Not currently on any potassium.  Patient continues with 1-3 loose BMs daily.  Nurse is not aware of any stool studies that have been completed.    No provider is currently at the [facility].  Nurse stated providers are typically there from 9 AM-3 PM.  I will reach back out to the [facility].  Also tried to call patient's daughter today but did not get an answer.

## 2020-03-25 NOTE — Telephone Encounter (Signed)
Tried calling [facility] today to speak with a Nurse Practitioner. Had to leave a message requesting a return call.

## 2020-03-26 NOTE — Telephone Encounter (Signed)
Spoke with Kennyth Lose, NP at [place] [facility]. Patient is likely transitioning to comfort care. She hasn't been to dialysis in 1 week. There are still conversations happening about this, but Memory Dance suspects patient will likely move forward with comfort care.   In regards to her hemoglobin, this was rechecked on 11/3 and is stable/improved to 7.9. Kennyth Lose states patient is not having bright red blood per rectum. Her stools are a little dark due to being on oral iron. She is not having diarrhea as she had been when seen in our office. Potassium was also rechecked and is back within normal limits.   As patient is likely transitioning to comfort care, I will not plan for any additional evaluation or OV at this time. I would be happy to see Mrs. Dain if needed in the future. Kennyth Lose states she is planning to talk with the daughters today or tomorrow to finalize details of comfort care. Kennyth Lose stated she would reach back out to our office if they need [place] to see Mrs. Rede, but for now, will hold off on arranging follow-up.

## 2020-03-26 NOTE — Telephone Encounter (Signed)
error 

## 2020-03-30 ENCOUNTER — Encounter (HOSPITAL_COMMUNITY): Payer: Medicare Other

## 2020-03-30 ENCOUNTER — Ambulatory Visit: Payer: 59

## 2020-04-15 DEATH — deceased
